# Patient Record
Sex: Female | Born: 1955 | Race: White | Hispanic: No | Marital: Married | Smoking: Never smoker
Health system: Southern US, Community
[De-identification: ages and names within clinical notes are randomized; demographics above are authoritative.]

## PROBLEM LIST (undated history)

## (undated) DIAGNOSIS — M310 Hypersensitivity angiitis: Secondary | ICD-10-CM

## (undated) DIAGNOSIS — G4733 Obstructive sleep apnea (adult) (pediatric): Secondary | ICD-10-CM

## (undated) DIAGNOSIS — I2510 Atherosclerotic heart disease of native coronary artery without angina pectoris: Secondary | ICD-10-CM

## (undated) DIAGNOSIS — M719 Bursopathy, unspecified: Secondary | ICD-10-CM

## (undated) DIAGNOSIS — N393 Stress incontinence (female) (male): Secondary | ICD-10-CM

## (undated) DIAGNOSIS — E039 Hypothyroidism, unspecified: Secondary | ICD-10-CM

## (undated) DIAGNOSIS — G5603 Carpal tunnel syndrome, bilateral upper limbs: Secondary | ICD-10-CM

## (undated) DIAGNOSIS — N390 Urinary tract infection, site not specified: Secondary | ICD-10-CM

## (undated) DIAGNOSIS — M1990 Unspecified osteoarthritis, unspecified site: Secondary | ICD-10-CM

## (undated) DIAGNOSIS — C801 Malignant (primary) neoplasm, unspecified: Secondary | ICD-10-CM

## (undated) DIAGNOSIS — E282 Polycystic ovarian syndrome: Secondary | ICD-10-CM

## (undated) DIAGNOSIS — H532 Diplopia: Secondary | ICD-10-CM

## (undated) DIAGNOSIS — N950 Postmenopausal bleeding: Secondary | ICD-10-CM

## (undated) DIAGNOSIS — G4730 Sleep apnea, unspecified: Secondary | ICD-10-CM

## (undated) DIAGNOSIS — N979 Female infertility, unspecified: Secondary | ICD-10-CM

## (undated) DIAGNOSIS — M5020 Other cervical disc displacement, unspecified cervical region: Secondary | ICD-10-CM

## (undated) DIAGNOSIS — I2584 Coronary atherosclerosis due to calcified coronary lesion: Principal | ICD-10-CM

## (undated) DIAGNOSIS — I499 Cardiac arrhythmia, unspecified: Secondary | ICD-10-CM

## (undated) DIAGNOSIS — I10 Essential (primary) hypertension: Secondary | ICD-10-CM

## (undated) DIAGNOSIS — D3A090 Benign carcinoid tumor of the bronchus and lung: Secondary | ICD-10-CM

## (undated) DIAGNOSIS — Z973 Presence of spectacles and contact lenses: Secondary | ICD-10-CM

## (undated) HISTORY — PX: POLYPECTOMY: SHX149

## (undated) HISTORY — DX: Stress incontinence (female) (male): N39.3

## (undated) HISTORY — DX: Hypothyroidism, unspecified: E03.9

## (undated) HISTORY — DX: Diplopia: H53.2

## (undated) HISTORY — PX: MOUTH SURGERY: SHX715

## (undated) HISTORY — DX: Other cervical disc displacement, unspecified cervical region: M50.20

## (undated) HISTORY — DX: Female infertility, unspecified: N97.9

## (undated) HISTORY — DX: Sleep apnea, unspecified: G47.30

## (undated) HISTORY — DX: Polycystic ovarian syndrome: E28.2

## (undated) HISTORY — DX: Urinary tract infection, site not specified: N39.0

## (undated) HISTORY — DX: Hypersensitivity angiitis: M31.0

## (undated) HISTORY — PX: CATARACT EXTRACTION: SUR2

## (undated) HISTORY — DX: Unspecified osteoarthritis, unspecified site: M19.90

## (undated) HISTORY — DX: Benign carcinoid tumor of the bronchus and lung: D3A.090

## (undated) HISTORY — PX: OTHER SURGICAL HISTORY: SHX169

## (undated) HISTORY — DX: Atherosclerotic heart disease of native coronary artery without angina pectoris: I25.10

## (undated) HISTORY — DX: Obstructive sleep apnea (adult) (pediatric): G47.33

## (undated) HISTORY — PX: WISDOM TOOTH EXTRACTION: SHX21

## (undated) HISTORY — PX: COLONOSCOPY: SHX174

## (undated) HISTORY — DX: Bursopathy, unspecified: M71.9

## (undated) HISTORY — DX: Coronary atherosclerosis due to calcified coronary lesion: I25.84

## (undated) HISTORY — DX: Essential (primary) hypertension: I10

---

## 1997-12-22 ENCOUNTER — Encounter: Payer: Self-pay | Admitting: Internal Medicine

## 2000-01-17 ENCOUNTER — Other Ambulatory Visit: Admission: RE | Admit: 2000-01-17 | Discharge: 2000-01-17 | Payer: Self-pay | Admitting: Internal Medicine

## 2001-01-19 ENCOUNTER — Other Ambulatory Visit: Admission: RE | Admit: 2001-01-19 | Discharge: 2001-01-19 | Payer: Self-pay | Admitting: Internal Medicine

## 2002-01-25 ENCOUNTER — Other Ambulatory Visit: Admission: RE | Admit: 2002-01-25 | Discharge: 2002-01-25 | Payer: Self-pay | Admitting: Internal Medicine

## 2003-01-13 ENCOUNTER — Encounter: Payer: Self-pay | Admitting: Internal Medicine

## 2003-02-06 ENCOUNTER — Other Ambulatory Visit: Admission: RE | Admit: 2003-02-06 | Discharge: 2003-02-06 | Payer: Self-pay | Admitting: Internal Medicine

## 2005-01-13 ENCOUNTER — Ambulatory Visit: Payer: Self-pay | Admitting: Internal Medicine

## 2005-01-17 ENCOUNTER — Ambulatory Visit: Payer: Self-pay | Admitting: Internal Medicine

## 2005-02-12 ENCOUNTER — Ambulatory Visit: Payer: Self-pay | Admitting: Internal Medicine

## 2005-02-20 ENCOUNTER — Ambulatory Visit: Payer: Self-pay | Admitting: Internal Medicine

## 2005-02-20 ENCOUNTER — Other Ambulatory Visit: Admission: RE | Admit: 2005-02-20 | Discharge: 2005-02-20 | Payer: Self-pay | Admitting: Internal Medicine

## 2005-07-16 ENCOUNTER — Ambulatory Visit: Payer: Self-pay | Admitting: Internal Medicine

## 2005-07-31 ENCOUNTER — Encounter (INDEPENDENT_AMBULATORY_CARE_PROVIDER_SITE_OTHER): Payer: Self-pay | Admitting: *Deleted

## 2005-07-31 ENCOUNTER — Ambulatory Visit: Payer: Self-pay | Admitting: Internal Medicine

## 2006-01-12 ENCOUNTER — Ambulatory Visit: Payer: Self-pay | Admitting: Internal Medicine

## 2006-02-23 ENCOUNTER — Other Ambulatory Visit: Admission: RE | Admit: 2006-02-23 | Discharge: 2006-02-23 | Payer: Self-pay | Admitting: Obstetrics & Gynecology

## 2006-05-11 ENCOUNTER — Ambulatory Visit: Payer: Self-pay | Admitting: Internal Medicine

## 2006-08-05 ENCOUNTER — Ambulatory Visit: Payer: Self-pay | Admitting: Internal Medicine

## 2006-09-03 ENCOUNTER — Ambulatory Visit: Payer: Self-pay | Admitting: Internal Medicine

## 2006-10-15 ENCOUNTER — Ambulatory Visit: Payer: Self-pay | Admitting: Internal Medicine

## 2006-11-17 ENCOUNTER — Ambulatory Visit: Payer: Self-pay | Admitting: Internal Medicine

## 2007-01-18 ENCOUNTER — Ambulatory Visit: Payer: Self-pay | Admitting: Internal Medicine

## 2007-01-18 LAB — CONVERTED CEMR LAB
ALT: 21 units/L (ref 0–40)
AST: 24 units/L (ref 0–37)
Albumin: 3.4 g/dL — ABNORMAL LOW (ref 3.5–5.2)
Alkaline Phosphatase: 82 units/L (ref 39–117)
Basophils Absolute: 0 10*3/uL (ref 0.0–0.1)
Calcium: 9.1 mg/dL (ref 8.4–10.5)
Chloride: 103 meq/L (ref 96–112)
Cholesterol: 151 mg/dL (ref 0–200)
Creatinine, Ser: 0.7 mg/dL (ref 0.4–1.2)
Eosinophils Absolute: 0.2 10*3/uL (ref 0.0–0.6)
GFR calc non Af Amer: 93 mL/min
HCT: 43.9 % (ref 36.0–46.0)
LDL Cholesterol: 85 mg/dL (ref 0–99)
MCHC: 34.8 g/dL (ref 30.0–36.0)
MCV: 87.5 fL (ref 78.0–100.0)
Platelets: 283 10*3/uL (ref 150–400)
RBC: 5.02 M/uL (ref 3.87–5.11)
RDW: 12.3 % (ref 11.5–14.6)
Total Bilirubin: 0.7 mg/dL (ref 0.3–1.2)
Total CHOL/HDL Ratio: 2.9
Triglycerides: 72 mg/dL (ref 0–149)
WBC: 7.4 10*3/uL (ref 4.5–10.5)

## 2007-03-04 ENCOUNTER — Other Ambulatory Visit: Admission: RE | Admit: 2007-03-04 | Discharge: 2007-03-04 | Payer: Self-pay | Admitting: Obstetrics & Gynecology

## 2007-05-19 ENCOUNTER — Ambulatory Visit: Payer: Self-pay | Admitting: Internal Medicine

## 2007-05-19 LAB — CONVERTED CEMR LAB: TSH: 5.18 microintl units/mL (ref 0.35–5.50)

## 2007-06-25 DIAGNOSIS — K219 Gastro-esophageal reflux disease without esophagitis: Secondary | ICD-10-CM | POA: Insufficient documentation

## 2007-06-25 DIAGNOSIS — I10 Essential (primary) hypertension: Secondary | ICD-10-CM | POA: Insufficient documentation

## 2007-06-25 DIAGNOSIS — M1990 Unspecified osteoarthritis, unspecified site: Secondary | ICD-10-CM | POA: Insufficient documentation

## 2007-08-13 ENCOUNTER — Telehealth: Payer: Self-pay | Admitting: Internal Medicine

## 2007-08-17 ENCOUNTER — Ambulatory Visit: Payer: Self-pay | Admitting: Internal Medicine

## 2007-08-17 LAB — CONVERTED CEMR LAB
Bilirubin Urine: NEGATIVE
Bilirubin, Direct: 0.2 mg/dL (ref 0.0–0.3)
Blood in Urine, dipstick: NEGATIVE
Calcium: 8.8 mg/dL (ref 8.4–10.5)
Cholesterol: 144 mg/dL (ref 0–200)
Eosinophils Absolute: 0.2 10*3/uL (ref 0.0–0.6)
Eosinophils Relative: 2.3 % (ref 0.0–5.0)
GFR calc Af Amer: 113 mL/min
GFR calc non Af Amer: 93 mL/min
Glucose, Bld: 83 mg/dL (ref 70–99)
Glucose, Urine, Semiquant: NEGATIVE
Ketones, urine, test strip: NEGATIVE
Lymphocytes Relative: 31.9 % (ref 12.0–46.0)
MCHC: 34.4 g/dL (ref 30.0–36.0)
MCV: 89.2 fL (ref 78.0–100.0)
Neutro Abs: 4.8 10*3/uL (ref 1.4–7.7)
Neutrophils Relative %: 57.5 % (ref 43.0–77.0)
Platelets: 269 10*3/uL (ref 150–400)
Sodium: 141 meq/L (ref 135–145)
Specific Gravity, Urine: <1.005
TSH: 3.82 microintl units/mL (ref 0.35–5.50)
Triglycerides: 64 mg/dL (ref 0–149)
WBC Urine, dipstick: NEGATIVE
WBC: 8.4 10*3/uL (ref 4.5–10.5)
pH: 5.5

## 2007-08-24 ENCOUNTER — Ambulatory Visit: Payer: Self-pay | Admitting: Internal Medicine

## 2007-08-24 DIAGNOSIS — R946 Abnormal results of thyroid function studies: Secondary | ICD-10-CM | POA: Insufficient documentation

## 2007-08-24 LAB — HM COLONOSCOPY

## 2007-10-01 ENCOUNTER — Ambulatory Visit: Payer: Self-pay | Admitting: Internal Medicine

## 2007-12-28 ENCOUNTER — Telehealth: Payer: Self-pay | Admitting: Internal Medicine

## 2007-12-31 ENCOUNTER — Ambulatory Visit: Payer: Self-pay | Admitting: Internal Medicine

## 2008-01-02 LAB — CONVERTED CEMR LAB
TSH: 4.299 microintl units/mL (ref 0.350–5.50)
Thyroglobulin Ab: 48.9 (ref 0.0–60.0)

## 2008-01-07 ENCOUNTER — Ambulatory Visit: Payer: Self-pay | Admitting: Internal Medicine

## 2008-01-07 DIAGNOSIS — R002 Palpitations: Secondary | ICD-10-CM | POA: Insufficient documentation

## 2008-01-07 DIAGNOSIS — E669 Obesity, unspecified: Secondary | ICD-10-CM

## 2008-02-24 ENCOUNTER — Telehealth: Payer: Self-pay | Admitting: Internal Medicine

## 2008-03-06 ENCOUNTER — Ambulatory Visit: Payer: Self-pay | Admitting: Internal Medicine

## 2008-03-08 ENCOUNTER — Other Ambulatory Visit: Admission: RE | Admit: 2008-03-08 | Discharge: 2008-03-08 | Payer: Self-pay | Admitting: Obstetrics & Gynecology

## 2008-03-13 ENCOUNTER — Ambulatory Visit: Payer: Self-pay | Admitting: Internal Medicine

## 2008-03-13 DIAGNOSIS — R143 Flatulence: Secondary | ICD-10-CM

## 2008-03-13 DIAGNOSIS — R142 Eructation: Secondary | ICD-10-CM

## 2008-03-13 DIAGNOSIS — R141 Gas pain: Secondary | ICD-10-CM | POA: Insufficient documentation

## 2008-03-13 LAB — CONVERTED CEMR LAB
BUN: 15 mg/dL (ref 6–23)
Calcium: 9.1 mg/dL (ref 8.4–10.5)
GFR calc Af Amer: 113 mL/min
Glucose, Bld: 109 mg/dL — ABNORMAL HIGH (ref 70–99)

## 2008-04-04 ENCOUNTER — Encounter: Payer: Self-pay | Admitting: Internal Medicine

## 2008-04-25 ENCOUNTER — Encounter: Payer: Self-pay | Admitting: Internal Medicine

## 2008-06-06 ENCOUNTER — Encounter: Payer: Self-pay | Admitting: Internal Medicine

## 2008-06-07 ENCOUNTER — Ambulatory Visit: Payer: Self-pay | Admitting: Internal Medicine

## 2008-06-20 ENCOUNTER — Encounter: Payer: Self-pay | Admitting: Internal Medicine

## 2008-06-23 ENCOUNTER — Ambulatory Visit: Payer: Self-pay | Admitting: Internal Medicine

## 2008-07-31 ENCOUNTER — Encounter: Payer: Self-pay | Admitting: Internal Medicine

## 2008-10-09 ENCOUNTER — Ambulatory Visit: Payer: Self-pay | Admitting: Internal Medicine

## 2008-10-09 LAB — CONVERTED CEMR LAB
AST: 21 units/L (ref 0–37)
Alkaline Phosphatase: 57 units/L (ref 39–117)
BUN: 15 mg/dL (ref 6–23)
Basophils Absolute: 0 10*3/uL (ref 0.0–0.1)
Bilirubin, Direct: 0.1 mg/dL (ref 0.0–0.3)
Blood in Urine, dipstick: NEGATIVE
Chloride: 103 meq/L (ref 96–112)
Eosinophils Absolute: 0.1 10*3/uL (ref 0.0–0.7)
Eosinophils Relative: 2 % (ref 0.0–5.0)
GFR calc non Af Amer: 93 mL/min
HDL: 53.2 mg/dL (ref >39.0)
MCV: 91.2 fL (ref 78.0–100.0)
Neutrophils Relative %: 64.4 % (ref 43.0–77.0)
Nitrite: NEGATIVE
Platelets: 225 10*3/uL (ref 150–400)
Potassium: 4.3 meq/L (ref 3.5–5.1)
Protein, U semiquant: NEGATIVE
RDW: 12.4 % (ref 11.5–14.6)
Sodium: 140 meq/L (ref 135–145)
Total Bilirubin: 0.9 mg/dL (ref 0.3–1.2)
Total CHOL/HDL Ratio: 2.6
VLDL: 11 mg/dL (ref 0–40)
WBC Urine, dipstick: NEGATIVE
WBC: 7.1 10*3/uL (ref 4.5–10.5)
pH: 7.5

## 2008-10-16 ENCOUNTER — Ambulatory Visit: Payer: Self-pay | Admitting: Internal Medicine

## 2008-10-16 DIAGNOSIS — E039 Hypothyroidism, unspecified: Secondary | ICD-10-CM | POA: Insufficient documentation

## 2008-10-30 ENCOUNTER — Ambulatory Visit: Payer: Self-pay | Admitting: Internal Medicine

## 2008-10-30 DIAGNOSIS — K1370 Unspecified lesions of oral mucosa: Secondary | ICD-10-CM | POA: Insufficient documentation

## 2008-10-30 DIAGNOSIS — K121 Other forms of stomatitis: Secondary | ICD-10-CM | POA: Insufficient documentation

## 2008-10-30 DIAGNOSIS — K1230 Oral mucositis (ulcerative), unspecified: Secondary | ICD-10-CM

## 2009-01-09 ENCOUNTER — Ambulatory Visit: Payer: Self-pay | Admitting: Internal Medicine

## 2009-01-10 ENCOUNTER — Encounter: Payer: Self-pay | Admitting: Internal Medicine

## 2009-01-22 ENCOUNTER — Ambulatory Visit: Payer: Self-pay | Admitting: Internal Medicine

## 2009-03-01 LAB — HM MAMMOGRAPHY

## 2009-03-14 ENCOUNTER — Other Ambulatory Visit: Admission: RE | Admit: 2009-03-14 | Discharge: 2009-03-14 | Payer: Self-pay | Admitting: Obstetrics & Gynecology

## 2009-04-23 ENCOUNTER — Ambulatory Visit: Payer: Self-pay | Admitting: Internal Medicine

## 2009-05-01 LAB — CONVERTED CEMR LAB: TSH: 5.68 microintl units/mL — ABNORMAL HIGH (ref 0.35–5.50)

## 2009-05-04 ENCOUNTER — Encounter: Payer: Self-pay | Admitting: Internal Medicine

## 2009-06-26 ENCOUNTER — Ambulatory Visit: Payer: Self-pay | Admitting: Internal Medicine

## 2009-06-26 LAB — CONVERTED CEMR LAB: TSH: 3.68 microintl units/mL (ref 0.35–5.50)

## 2009-07-06 ENCOUNTER — Ambulatory Visit: Payer: Self-pay | Admitting: Internal Medicine

## 2009-07-06 DIAGNOSIS — M542 Cervicalgia: Secondary | ICD-10-CM | POA: Insufficient documentation

## 2009-10-08 ENCOUNTER — Ambulatory Visit: Payer: Self-pay | Admitting: Internal Medicine

## 2009-10-12 LAB — CONVERTED CEMR LAB: TSH: 1.54 microintl units/mL (ref 0.35–5.50)

## 2010-01-01 ENCOUNTER — Telehealth: Payer: Self-pay | Admitting: Internal Medicine

## 2010-01-07 ENCOUNTER — Ambulatory Visit: Payer: Self-pay | Admitting: Internal Medicine

## 2010-01-07 LAB — CONVERTED CEMR LAB
ALT: 22 units/L (ref 0–35)
AST: 25 units/L (ref 0–37)
Albumin: 3.6 g/dL (ref 3.5–5.2)
BUN: 16 mg/dL (ref 6–23)
Bilirubin Urine: NEGATIVE
Chloride: 104 meq/L (ref 96–112)
Cholesterol: 151 mg/dL (ref 0–200)
Eosinophils Relative: 2.9 % (ref 0.0–5.0)
FSH: 34.5 milliintl units/mL
Glucose, Bld: 93 mg/dL (ref 70–99)
Glucose, Urine, Semiquant: NEGATIVE
HCT: 46 % (ref 36.0–46.0)
Hemoglobin: 15.6 g/dL — ABNORMAL HIGH (ref 12.0–15.0)
Lymphs Abs: 1.8 10*3/uL (ref 0.7–4.0)
MCV: 91.8 fL (ref 78.0–100.0)
Monocytes Absolute: 0.8 10*3/uL (ref 0.1–1.0)
Monocytes Relative: 10.6 % (ref 3.0–12.0)
Neutro Abs: 4.3 10*3/uL (ref 1.4–7.7)
Platelets: 229 10*3/uL (ref 150.0–400.0)
Potassium: 4.1 meq/L (ref 3.5–5.1)
RDW: 11.8 % (ref 11.5–14.6)
Sodium: 140 meq/L (ref 135–145)
TSH: 3.7 microintl units/mL (ref 0.35–5.50)
Total Bilirubin: 0.7 mg/dL (ref 0.3–1.2)
Total Protein: 7.6 g/dL (ref 6.0–8.3)
WBC Urine, dipstick: NEGATIVE
WBC: 7.1 10*3/uL (ref 4.5–10.5)
pH: 7.5

## 2010-01-21 ENCOUNTER — Ambulatory Visit: Payer: Self-pay | Admitting: Internal Medicine

## 2010-01-21 DIAGNOSIS — J069 Acute upper respiratory infection, unspecified: Secondary | ICD-10-CM | POA: Insufficient documentation

## 2010-01-21 DIAGNOSIS — M2550 Pain in unspecified joint: Secondary | ICD-10-CM | POA: Insufficient documentation

## 2010-05-08 ENCOUNTER — Ambulatory Visit: Payer: Self-pay | Admitting: Internal Medicine

## 2010-05-10 ENCOUNTER — Encounter: Payer: Self-pay | Admitting: *Deleted

## 2010-05-23 ENCOUNTER — Telehealth: Payer: Self-pay | Admitting: *Deleted

## 2010-06-28 ENCOUNTER — Encounter: Payer: Self-pay | Admitting: Internal Medicine

## 2010-07-20 HISTORY — PX: SHOULDER OPEN ROTATOR CUFF REPAIR: SHX2407

## 2010-12-01 DIAGNOSIS — D3A090 Benign carcinoid tumor of the bronchus and lung: Secondary | ICD-10-CM

## 2010-12-01 HISTORY — PX: OTHER SURGICAL HISTORY: SHX169

## 2010-12-01 HISTORY — DX: Benign carcinoid tumor of the bronchus and lung: D3A.090

## 2010-12-31 NOTE — Letter (Signed)
Summary: Request for Surgical Clearance/[facility]  Request for Surgical Clearance/ Orthopaedics   Imported By: Maryln Gottron 07/10/2010 09:30:22  _____________________________________________________________________  External Attachment:    Type:   Image     Comment:   External Document

## 2010-12-31 NOTE — Assessment & Plan Note (Signed)
Summary: cpx/no pap/njr pt rsc/njr   Vital Signs:  Patient profile:   55 year old female Menstrual status:  irregular LMP:     01/29/2009 Height:      59.75 inches Weight:      220 pounds BMI:     43.48 Pulse rate:   78 / minute BP sitting:   130 / 80  (left arm) Cuff size:   large  Vitals Entered By: Romualdo Bolk, CMA (AAMA) (January 21, 2010 10:01 AM) CC: CPX no pap- Pt has a gyn who does paps. LMP (date): 01/29/2009 Menarche (age onset years): 10   Menses interval (days): varies Menstrual flow (days): 4 Enter LMP: 01/29/2009 Last PAP Result normal   History of Present Illness: Nichole Smith comesin today for  preventive visit . Since last visit  here  there have been no major changes in health status   however has gained some weight recently.   URI :    Head cold  for 2 weeks and  now    getting better.  no fever of pain with this.  BP:   120/80 range controlled. Has general aches and pains that move around . H xof DJD   on MRI  seen ortho in the past.  Start   skipping exercise. because of  discomfort.    THyroid : no change in meds  No menses for 11 months   sees gyne no hot flushes  sleeping better as husband is now on machine for snoring.   Preventive Care Screening  Mammogram:    Date:  03/01/2009    Results:  normal   Pap Smear:    Date:  03/01/2009    Results:  normal   Colonoscopy:    Date:  07/31/2005    Results:  normal   Prior Values:    Last Tetanus Booster:  Tdap (10/16/2008)   Preventive Screening-Counseling & Management  Alcohol-Tobacco     Alcohol drinks/day: 0     Smoking Status: never  Caffeine-Diet-Exercise     Caffeine use/day: 2     Does Patient Exercise: yes     Type of exercise: aerobics, weight, walking, h20 aeerobics     Exercise (avg: min/session): 30-60     Times/week: 7  Hep-HIV-STD-Contraception     Dental Visit-last 6 months yes  Safety-Violence-Falls     Seat Belt Use: yes     Smoke Detectors:  yes  EKG  Procedure date:  10/26/2008  Findings:      Sinus bradycardia with rate of:  59  Current Medications (verified): 1)  Cyclobenzaprine Hcl 10 Mg  Tabs (Cyclobenzaprine Hcl) .[name].. 1 By Mouth Three Times A Day As Needed Muscle Spasm 2)  Maxzide-25 37.5-25 Mg  Tabs (Triamterene-Hctz) .[name].. 1 By Mouth Once Daily 3)  Norvasc 2.5 Mg  Tabs (Amlodipine Besylate) .[name].. 1 By Mouth Once Daily 4)  Metoprolol Succinate 100 Mg  Xr24h-Tab (Metoprolol Succinate) .[name].. 1 By Mouth Once Daily 5)  Synthroid 137 Mcg Tabs (Levothyroxine Sodium) .[name].. 1 By Mouth Once Daily  Allergies (verified): 1)  ! Neomycin  Past History:  Past medical, surgical, family and social histories (including risk factors) reviewed, and no changes noted (except as noted below).  Past Medical History: Reviewed history from 01/22/2009 and no changes required. GERD Hypertension Osteoarthritis C6 C7 HNP childbirth x3 twin pregnancy   POs antithyroid antibodies         LAST Mammogram: 4/09 Pap: 4/09 Td: 1999- doing today Colonscopy: 07/31/05 EKG:  doing today [facility]  Elizebeth Brooking  Past Surgical History: Reviewed history from 06/25/2007 and no changes required. Caesarean section  Past History:  Care Management: OB/Gyn: Hyac[facility] Gastroenterology: [facility]  Family History: Reviewed history from 10/16/2008 and no changes required. non contributory Family History of CAD Female 1st degree relative 43 father  Family History Hypertension MOM brothersx2 Family History of Stroke F 1st degree relative  mom 48 Fa died 63 ? MI Mom died 35 ? CVA tob, HT  Brother  recently  dx    multiple system atrophy.   GF  Parkinsons.     Social History: Reviewed history from 01/22/2009 and no changes required. 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    Hhof 4    Seat Belt Use:  yes Dental Care w/in 6 mos.:  yes  Review of Systems  The patient denies anorexia, fever, weight loss, weight  gain, vision loss, decreased hearing, hoarseness, chest pain, syncope, dyspnea on exertion, peripheral edema, prolonged cough, headaches, hemoptysis, abdominal pain, melena, hematochezia, severe indigestion/heartburn, hematuria, muscle weakness, transient blindness, difficulty walking, depression, unusual weight change, abnormal bleeding, enlarged lymph nodes, angioedema, and breast masses.   Physical Exam General Appearance: well developed, well nourished, no acute distress Eyes: conjunctiva and lids normal, PERRLA, EOMI, WNL Ears, Nose, Mouth, Throat: TM clear, nares clear, oral exam WNL minimal congestion Neck: supple, no lymphadenopathy, no thyromegaly, no JVD Respiratory: clear to auscultation and percussion, respiratory effort normal Cardiovascular: regular rate and rhythm, S1-S2, no murmur, rub or gallop, no bruits, peripheral pulses normal and symmetric, no cyanosis, clubbing, edema or varicosities Chest: no scars, masses, tenderness; no asymmetry, skin changes, nipple discharge   Gastrointestinal: soft, non-tender; no hepatosplenomegaly, masses; active bowel sounds all quadrants,  Genitourinary: per gyne Lymphatic: no cervical, axillary or inguinal adenopathy Musculoskeletal: gait normal, muscle tone and strength WNL, no joint swelling, effusions, discoloration, crepitus  Skin: clear, good turgor, color WNL, no rashes, lesions, or ulcerations Neurologic: normal mental status, normal reflexes, normal strength, sensation, and motion Psychiatric: alert; oriented to person, place and time Other Exam:  labs normal     Impression & Recommendations:  Problem # 1:  HEALTH MAINTENANCE EXAM, ADULT (ICD-V70.0) counseled    about lifestyle intervention and weight loss again  Problem # 2:  HYPOTHYROIDISM (ICD-244.9)  branded med   tsh ok today but recheck in 4-6 months to ensure not increasing The following medications were removed from the medication list:    Synthroid 125 Mcg Tabs  (Levothyroxine sodium) .[name]... 1 by mouth once daily Her updated medication list for this problem includes:    Synthroid 137 Mcg Tabs (Levothyroxine sodium) .[name]... 1 by mouth once daily  Labs Reviewed: TSH: 3.70 (01/07/2010)    Chol: 151 (01/07/2010)   HDL: 55.70 (01/07/2010)   LDL: 81 (01/07/2010)   TG: 73.0 (01/07/2010)  Orders: Prescription Created Electronically [phone])  Problem # 3:  PAIN IN JOINT, MULTIPLE SITES (ICD-719.49) prob djd  by hx and prev evals    .[name]   Discussed risk benefit  or nsaids but is keeping her form being active then can try once a day.   losng weight will help her  joint snd she is aware of this.   Problem # 4:  HYPERTENSION (ICD-401.9) Assessment: Unchanged  Her updated medication list for this problem includes:    Maxzide-25 37.5-25 Mg Tabs (Triamterene-hctz) .[name]... 1 by mouth once daily    Norvasc 2.5 Mg Tabs (Amlodipine besylate) .Marland KitchenMarland KitchenMarland KitchenMarland Kitchen 1  by mouth once daily    Metoprolol Succinate 100 Mg Xr24h-tab (Metoprolol succinate) .[name]... 1 by mouth once daily  BP today: 130/80 Prior BP: 120/78 (07/06/2009)  Prior 10 Yr Risk Heart Disease: 5 % (01/22/2009)  Labs Reviewed: K+: 4.1 (01/07/2010) Creat: : 0.8 (01/07/2010)   Chol: 151 (01/07/2010)   HDL: 55.70 (01/07/2010)   LDL: 81 (01/07/2010)   TG: 73.0 (01/07/2010)  Problem # 5:  URI (ICD-465.9) resolving uncomplicated   call as needed.   Complete Medication List: 1)  Cyclobenzaprine Hcl 10 Mg Tabs (Cyclobenzaprine hcl) .[name].. 1 by mouth three times a day as needed muscle spasm 2)  Maxzide-25 37.5-25 Mg Tabs (Triamterene-hctz) .[name].. 1 by mouth once daily 3)  Norvasc 2.5 Mg Tabs (Amlodipine besylate) .[name].. 1 by mouth once daily 4)  Metoprolol Succinate 100 Mg Xr24h-tab (Metoprolol succinate) .[name].. 1 by mouth once daily 5)  Synthroid 137 Mcg Tabs (Levothyroxine sodium) .[name].. 1 by mouth once daily  Patient Instructions: 1)  take  aleve once a day for about 2 weeks and then as needed .  2)  recheck TSH in 4-6 months  if  ok then  can  recheck  cpx in a year form now. 3)  Continue weight loss .  Prescriptions: SYNTHROID 137 MCG TABS (LEVOTHYROXINE SODIUM) 1 by mouth once daily Brand medically necessary #90 x 3   Entered and Authorized by:   Madelin Headings MD   Signed by:   Madelin Headings MD on 01/21/2010   Method used:   Electronically to        [facility] [phone]* (retail)       [address]       [place], [place]  [address]       Ph: [phone] or [phone]       Fax: [phone]   RxID:   [id number] METOPROLOL SUCCINATE 100 MG  XR24H-TAB (METOPROLOL SUCCINATE) 1 by mouth once daily  #90 Tablet x 3   Entered and Authorized by:   Madelin Headings MD   Signed by:   Madelin Headings MD on 01/21/2010   Method used:   Electronically to        [facility] [phone]* (retail)       [address]       [place], [place]  [address]       Ph: [phone] or [phone]       Fax: [phone]   RxID:   [id number] NORVASC 2.5 MG  TABS (AMLODIPINE BESYLATE) 1 by mouth once daily  #90 Tablet x 3   Entered and Authorized by:   Madelin Headings MD   Signed by:   Madelin Headings MD on 01/21/2010   Method used:   Electronically to        [id number] (retail)       [address]       [place], [place]  [address]       Ph: [phone] or [phone]       Fax: [phone]   RxID:   [id number] MAXZIDE-25 37.5-25 MG  TABS (TRIAMTERENE-HCTZ) 1 by mouth once daily  #90 x 3   Entered and Authorized by:   Madelin Headings MD   Signed by:   Madelin Headings MD on 01/21/2010   Method used:   [facility]        [id number]       [address]       [place], [place]  [address]       Ph: [phone] or [phone]       Fax: [phone]   RxID:   [id number]

## 2010-12-31 NOTE — Letter (Signed)
Summary: Generic Letter  [place] [facility]  [address] [place], [place] [address]   Phone: [phone]  Fax: [phone]    05/10/2010  EMSLEY CUSTER [address] [place], [place]  [address]  Dear Ms. Mayford Knife,  Your thyroid test is normal. We will see you for your physical in Feb. 2012 or soonier if needed. If you have any questions, please give [name] a call at [phone].         Sincerely,   Tor Netters, CMA (AAMA)

## 2010-12-31 NOTE — Progress Notes (Signed)
Summary: Surgery clearance form, OV needed?  Phone Note Call from Patient Call back at Work Phone [phone]   Caller: Patient Call For: Madelin Headings MD Summary of Call: VM from pt, she has a form for Dr Fabian Sharp to fill out, clearance for Dr Jillyn Hidden to do surgery on her torn rotator cuff. Can she drop if off or does Dr Fabian Sharp need to see me?  Initial call taken by: Sid Falcon LPN,  May 23, 2010 10:35 AM  Follow-up for Phone Call        ok to just drop it off  form as long as she is not having any new problems Follow-up by: Madelin Headings MD,  May 23, 2010 12:47 PM  Additional Follow-up for Phone Call Additional follow up Details #1::        Pt aware and will drop off the form. Additional Follow-up by: Romualdo Bolk, CMA Duncan Dull),  May 24, 2010 12:45 PM

## 2010-12-31 NOTE — Progress Notes (Signed)
Summary: [facility] requested with Mon labs  Phone Note Call from Patient Call back at [facility] Phone [phone] Call back at Work Phone [phone]   Call For: Nichole Smith/shannon Summary of Call: With Mon labs want the [facility] menopause determiner, done.  No period 10 months.  GYN appt April.  I'm a hard stick, so having it done once is better than twice. Initial call taken by: Rudy Jew, RN,  January 01, 2010 4:18 PM  Follow-up for Phone Call        Per Dr. Fabian Sharp- she doesn't normally do them because in a typical menopause situation it is not usually required. But if she thinks her gyn needs it we can do it. Follow-up by: Romualdo Bolk, CMA Duncan Dull),  January 01, 2010 5:27 PM  Additional Follow-up for Phone Call Additional follow up Details #1::        Left message to inform that if Gyn needs it will add to [facility] labs. Additional Follow-up by: Rudy Jew, RN,  January 02, 2010 3:31 PM

## 2011-01-06 ENCOUNTER — Encounter: Payer: Self-pay | Admitting: Internal Medicine

## 2011-01-13 ENCOUNTER — Other Ambulatory Visit: Payer: BC Managed Care – PPO | Admitting: Internal Medicine

## 2011-01-13 DIAGNOSIS — Z0000 Encounter for general adult medical examination without abnormal findings: Secondary | ICD-10-CM

## 2011-01-13 LAB — CBC WITH DIFFERENTIAL/PLATELET
Basophils Absolute: 0 10*3/uL (ref 0.0–0.1)
Basophils Relative: 0.4 % (ref 0.0–3.0)
Eosinophils Relative: 1.8 % (ref 0.0–5.0)
Hemoglobin: 15.7 g/dL — ABNORMAL HIGH (ref 12.0–15.0)
Lymphocytes Relative: 36.3 % (ref 12.0–46.0)
Monocytes Relative: 7.1 % (ref 3.0–12.0)
Neutro Abs: 4.2 10*3/uL (ref 1.4–7.7)
RBC: 5.03 Mil/uL (ref 3.87–5.11)

## 2011-01-13 LAB — POCT URINALYSIS DIPSTICK
Blood, UA: NEGATIVE
Ketones, UA: NEGATIVE
Protein, UA: NEGATIVE
Spec Grav, UA: 1.015
Urobilinogen, UA: 0.2
pH, UA: 7.5

## 2011-01-13 LAB — LIPID PANEL
Cholesterol: 145 mg/dL (ref 0–200)
HDL: 45.5 mg/dL (ref >39.00)
LDL Cholesterol: 89 mg/dL (ref 0–99)
Triglycerides: 55 mg/dL (ref 0.0–149.0)
VLDL: 11 mg/dL (ref 0.0–40.0)

## 2011-01-13 LAB — BASIC METABOLIC PANEL
Calcium: 8.7 mg/dL (ref 8.4–10.5)
GFR: 105.69 mL/min (ref >60.00)
Sodium: 140 mEq/L (ref 135–145)

## 2011-01-13 LAB — HEPATIC FUNCTION PANEL
AST: 21 U/L (ref 0–37)
Albumin: 3.5 g/dL (ref 3.5–5.2)
Total Bilirubin: 0.7 mg/dL (ref 0.3–1.2)

## 2011-01-13 LAB — TSH: TSH: 1.93 u[IU]/mL (ref 0.35–5.50)

## 2011-01-22 ENCOUNTER — Encounter: Payer: Self-pay | Admitting: Internal Medicine

## 2011-01-22 ENCOUNTER — Ambulatory Visit (INDEPENDENT_AMBULATORY_CARE_PROVIDER_SITE_OTHER): Payer: BC Managed Care – PPO | Admitting: Internal Medicine

## 2011-01-22 VITALS — BP 120/84 | HR 72 | Temp 98.0°F | Resp 12 | Ht 60.5 in | Wt 221.0 lb

## 2011-01-22 DIAGNOSIS — K219 Gastro-esophageal reflux disease without esophagitis: Secondary | ICD-10-CM

## 2011-01-22 DIAGNOSIS — E039 Hypothyroidism, unspecified: Secondary | ICD-10-CM

## 2011-01-22 DIAGNOSIS — M5020 Other cervical disc displacement, unspecified cervical region: Secondary | ICD-10-CM

## 2011-01-22 DIAGNOSIS — Z0000 Encounter for general adult medical examination without abnormal findings: Secondary | ICD-10-CM

## 2011-01-22 DIAGNOSIS — M542 Cervicalgia: Secondary | ICD-10-CM

## 2011-01-22 DIAGNOSIS — I10 Essential (primary) hypertension: Secondary | ICD-10-CM

## 2011-01-22 DIAGNOSIS — E669 Obesity, unspecified: Secondary | ICD-10-CM

## 2011-01-22 DIAGNOSIS — M1990 Unspecified osteoarthritis, unspecified site: Secondary | ICD-10-CM

## 2011-01-22 HISTORY — DX: Other cervical disc displacement, unspecified cervical region: M50.20

## 2011-01-22 MED ORDER — METOPROLOL SUCCINATE ER 100 MG PO TB24: 100.0000 mg | ORAL_TABLET | Freq: Every day | ORAL | Status: DC

## 2011-01-22 MED ORDER — TRIAMTERENE-HCTZ 37.5-25 MG PO TABS: 1.0000 | ORAL_TABLET | Freq: Every day | ORAL | Status: DC

## 2011-01-22 MED ORDER — LEVOTHYROXINE SODIUM 137 MCG PO TABS: 137.0000 ug | ORAL_TABLET | Freq: Every day | ORAL | Status: DC

## 2011-01-22 MED ORDER — AMLODIPINE BESYLATE 2.5 MG PO TABS: 2.5000 mg | ORAL_TABLET | Freq: Every day | ORAL | Status: DC

## 2011-01-22 NOTE — Patient Instructions (Signed)
Continue life stle changes  return office visit in 6 months  Call in meantime if needed

## 2011-01-22 NOTE — Assessment & Plan Note (Signed)
Having more difficulty since had shoulder surgery but did lose 7 # since Jan.     Gyne sugg check out lap band but hesitant Counseled. Today that she may be a good candidate as she is motivated and has been implementing interventions for a while

## 2011-01-22 NOTE — Progress Notes (Signed)
  Subjective:    Patient ID: Nichole Smith, female    DOB: May 12, 1956, 55 y.o.   MRN: [id number]  HPI PAt comesin today for preventive check and med disease follow up . No major change in health status since last visit .  Except had right rotator cuff repair in Aug 2011 . Healing  Left shoulder is still an issues. HT: controlled THyroid : taking brand meds Obesity: had gained  And now losing but taking a lot to do this  And asks advice about lap band sugg by her gyne.   Review of Systems 12 system review neg for cp sob  Pos:for joint pains no weakness some tingling in hands when positioned upright. No weakness  No falls bleedingg change in bowel habits hearing or vision.      Objective:   Physical Exam Physical Exam: Vital signs reviewed LOV:FIEP is a well-developed well-nourished alert cooperative  white female who appears her stated age in no acute distress.  HEENT: normocephalic  traumatic , Eyes: PERRL EOM's full, conjunctiva clear, Nares: paten,t no deformity discharge or tenderness., Ears: no deformity EAC's clear TMs with normal landmarks. Mouth: clear OP, no lesions, edema.  Moist mucous membranes. Dentition in adequate repair. NECK: supple without masses, thyromegaly or bruits. CHEST/PULM:  Clear to auscultation and percussion breath sounds equal no wheeze , rales or rhonchi. No chest wall deformities or tenderness. Breast: no nodule of discharge  axilla clear  CV: PMI is nondisplaced, S1 S2 no gallops, murmurs, rubs. Peripheral pulses are full without delay.No JVD .  ABDOMEN: Bowel sounds normal nontender  No guard or rebound, no hepato splenomegal no CVA tenderness.  No hernia. Extremtities:  No clubbing cyanosis or edema, no acute joint swelling or redness no focal atrophy some oa change  NEURO:  Oriented x3, cranial nerves 3-12 appear to be intact, no obvious focal weakness,gait within normal limits no abnormal reflexes or asymmetrical SKIN: No acute rashes normal turgor,  color, no bruising or petechiae. PSYCH: Oriented, good eye contact, no obvious depression anxiety, cognition and judgment appear normal.  labs reviewed with patient        Assessment & Plan:  Preventive visit  Thyroid HT Obesity  Counseled.  About options in her situation. Continue lifestyle intervention healthy eating and exercise .

## 2011-01-22 NOTE — Assessment & Plan Note (Signed)
Controlled on current regimen.   

## 2011-01-22 NOTE — Assessment & Plan Note (Signed)
From presumed  Neck cervical disease

## 2011-01-22 NOTE — Assessment & Plan Note (Signed)
Currently on no meds  

## 2011-01-22 NOTE — Assessment & Plan Note (Addendum)
Continue same meds

## 2011-01-22 NOTE — Assessment & Plan Note (Signed)
No change 

## 2011-02-11 ENCOUNTER — Other Ambulatory Visit: Payer: Self-pay | Admitting: Internal Medicine

## 2011-05-07 ENCOUNTER — Telehealth: Payer: Self-pay | Admitting: Internal Medicine

## 2011-05-07 NOTE — Telephone Encounter (Signed)
Nothing from Dr. Dellia Nims office yet. Pt to call them and get the records from them. Appt made because pt wants md's opinion of her dx. Pt to get a copy of the records and bring them with her incase we don't get them.

## 2011-05-07 NOTE — Telephone Encounter (Signed)
Pt called and wanted to see if Dr Fabian Sharp had rcvd any lab result from dermatologist, Dr Erven Colla office. This was a biopsy that was done on pts leg approx 3 wks ago. Pls call.

## 2011-05-26 ENCOUNTER — Encounter: Payer: Self-pay | Admitting: Internal Medicine

## 2011-05-26 ENCOUNTER — Ambulatory Visit (INDEPENDENT_AMBULATORY_CARE_PROVIDER_SITE_OTHER): Payer: BC Managed Care – PPO | Admitting: Internal Medicine

## 2011-05-26 VITALS — BP 130/80 | HR 72 | Wt 221.0 lb

## 2011-05-26 DIAGNOSIS — H6090 Unspecified otitis externa, unspecified ear: Secondary | ICD-10-CM

## 2011-05-26 DIAGNOSIS — H60399 Other infective otitis externa, unspecified ear: Secondary | ICD-10-CM

## 2011-05-26 DIAGNOSIS — R918 Other nonspecific abnormal finding of lung field: Secondary | ICD-10-CM

## 2011-05-26 DIAGNOSIS — R9389 Abnormal findings on diagnostic imaging of other specified body structures: Secondary | ICD-10-CM

## 2011-05-26 DIAGNOSIS — M310 Hypersensitivity angiitis: Secondary | ICD-10-CM

## 2011-05-26 DIAGNOSIS — H6120 Impacted cerumen, unspecified ear: Secondary | ICD-10-CM

## 2011-05-26 HISTORY — DX: Hypersensitivity angiitis: M31.0

## 2011-05-26 LAB — POCT URINALYSIS DIPSTICK
Blood, UA: NEGATIVE
Glucose, UA: NEGATIVE
Spec Grav, UA: 1.01
Urobilinogen, UA: 0.2
pH, UA: 5.5

## 2011-05-26 MED ORDER — DESONIDE 0.05 % EX LOTN: TOPICAL_LOTION | CUTANEOUS | Status: DC

## 2011-05-26 NOTE — Progress Notes (Signed)
  Subjective:    Patient ID: Nichole Smith, female    DOB: [birth year], 55 y.o.   MRN: [id number]  HPI Patient comes in today at the request of her dermatologist Dr. Elmon Else. She had a rash of some sort on her left lower extremity that was biopsied and showed leukocytoclastic angiitis.  She is requesting screening tests for vascular disease to include above labs.  Has had this  Rash in the past with  Prolonged exercise in the heat  And found this after travel to [place].    Originally got in heat of [place]  Years ago .  Usually medial rash.  Golfers vascultitis.?    She is also having problems with her right ear where it feels clogged and had been itchy. She had tried scratching and now does not hear well in her right ear. No otherwise fever congestion otherwise. HT thyroid no change on meds  Review of Systems Negative for chest pain shortness of breath hemoptysis wheezing asthma. New major changes in vision or hearing chest pain or new shortness of breath. No acute joint swelling although has had arthritis pain baack pain hx  no change no redness over her joints. No hematuria.  Has hypertension controlled hypothyroidism on medication. No raynauds phenom  Past history family history social history reviewed in the electronic medical record.     Objective:   Physical Exam WdWN in nad  HEENT: Normocephalic ;atraumatic , Eyes;  PERRL, EOMs  Full, lids and conjunctiva clear,,Ears: no deformities, canals nl left  Right with yelllow flaking wax and debri  Tm seems nl no redness or swelling. After eructation of the ear  , TM landmarks normal, Nose: no deformity or discharge  Mouth : OP clear without lesion or edema . Neck no masse s Chest:  Clear to A&P without wheezes rales or rhonchi CV:  S1-S2 no gallops or murmurs peripheral perfusion is normal Skin le  smal peppery red areas no purpura or petechia  Nl perfusion  Abdomen:  Sof,t normal bowel sounds without hepatosplenomegaly, no  guarding rebound or masses no CVA tenderness Neuro grossly intact .      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   No ob vascultitis or  Underlying cause at this time but plan screening tests as recommended .   Ear external otitis seems eczematous.   Discussion and can try steroid lotion as needed call of this is not helping.  Hypertension Thyroid disease Obesity Neck and back pain presumed degenerative. chronic and not aggressive.    Addendum :labs normal   Chest x ray shows prominent right hilum and chest ct recommended  Will proceed.

## 2011-05-26 NOTE — Patient Instructions (Signed)
Will notify you  of labs when available. And also  Dr Emily Filbert. Try steroid lotion a few drops in ear 2 x  Per day for no more than 7-10 days at a time for ear eczema.

## 2011-05-27 ENCOUNTER — Ambulatory Visit (INDEPENDENT_AMBULATORY_CARE_PROVIDER_SITE_OTHER)
Admission: RE | Admit: 2011-05-27 | Discharge: 2011-05-27 | Disposition: A | Discharge status: Home / Self Care | Payer: BC Managed Care – PPO | Source: Ambulatory Visit | Attending: Internal Medicine | Admitting: Internal Medicine

## 2011-05-27 DIAGNOSIS — M310 Hypersensitivity angiitis: Secondary | ICD-10-CM

## 2011-05-27 LAB — COMPREHENSIVE METABOLIC PANEL
AST: 24 U/L (ref 0–37)
Alkaline Phosphatase: 72 U/L (ref 39–117)
BUN: 22 mg/dL (ref 6–23)
Glucose, Bld: 91 mg/dL (ref 70–99)
Sodium: 139 mEq/L (ref 135–145)
Total Bilirubin: 0.5 mg/dL (ref 0.3–1.2)
Total Protein: 6.9 g/dL (ref 6.0–8.3)

## 2011-05-27 LAB — CBC WITH DIFFERENTIAL/PLATELET
Basophils Absolute: 0.1 10*3/uL (ref 0.0–0.1)
Basophils Relative: 0.5 % (ref 0.0–3.0)
Eosinophils Absolute: 0.3 10*3/uL (ref 0.0–0.7)
MCHC: 33.9 g/dL (ref 30.0–36.0)
MCV: 91.8 fl (ref 78.0–100.0)
Monocytes Absolute: 0.7 10*3/uL (ref 0.1–1.0)
Neutrophils Relative %: 55.9 % (ref 43.0–77.0)
RBC: 4.98 Mil/uL (ref 3.87–5.11)
RDW: 13.2 % (ref 11.5–14.6)

## 2011-05-27 LAB — HEPATITIS C ANTIBODY: HCV Ab: NEGATIVE

## 2011-05-27 LAB — ANA: Anti Nuclear Antibody(ANA): NEGATIVE

## 2011-05-27 LAB — C-REACTIVE PROTEIN: CRP: 0.5 mg/dL (ref <0.6)

## 2011-05-27 LAB — CYCLIC CITRUL PEPTIDE ANTIBODY, IGG: Cyclic Citrullin Peptide Ab: <2 U/mL (ref 0.0–5.0)

## 2011-05-28 ENCOUNTER — Telehealth: Payer: Self-pay | Admitting: Internal Medicine

## 2011-05-28 ENCOUNTER — Ambulatory Visit (INDEPENDENT_AMBULATORY_CARE_PROVIDER_SITE_OTHER)
Admission: RE | Admit: 2011-05-28 | Discharge: 2011-05-28 | Disposition: A | Discharge status: Home / Self Care | Payer: BC Managed Care – PPO | Source: Ambulatory Visit | Attending: Internal Medicine | Admitting: Internal Medicine

## 2011-05-28 DIAGNOSIS — R918 Other nonspecific abnormal finding of lung field: Secondary | ICD-10-CM

## 2011-05-28 DIAGNOSIS — R911 Solitary pulmonary nodule: Secondary | ICD-10-CM

## 2011-05-28 DIAGNOSIS — R9389 Abnormal findings on diagnostic imaging of other specified body structures: Secondary | ICD-10-CM

## 2011-05-28 MED ORDER — IOHEXOL 300 MG/ML  SOLN
80.0000 mL | Freq: Once | INTRAMUSCULAR | Status: AC | PRN
  Administered 2011-05-28: 80 mL via INTRAVENOUS

## 2011-05-28 NOTE — Telephone Encounter (Signed)
Discussed results of the CT scan with the patient. He has no symptoms We will plan on chest surgery consult as soon as possible. Unsure best method to get a biopsy.  She is a nonsmoker but did grow up in smoking household.  Please send a referral for cardiothoracic surgery to be seen as soon as possible for a suspicious pulmonary nodule. The chest CT

## 2011-05-29 ENCOUNTER — Encounter: Payer: Self-pay | Admitting: Internal Medicine

## 2011-05-29 NOTE — Progress Notes (Signed)
Taken care of

## 2011-05-29 NOTE — Telephone Encounter (Signed)
Order sent to [facility] 

## 2011-06-03 ENCOUNTER — Telehealth: Payer: Self-pay | Admitting: *Deleted

## 2011-06-03 ENCOUNTER — Other Ambulatory Visit: Payer: Self-pay | Admitting: Surgery

## 2011-06-03 ENCOUNTER — Encounter (INDEPENDENT_AMBULATORY_CARE_PROVIDER_SITE_OTHER): Payer: BC Managed Care – PPO | Admitting: Surgery

## 2011-06-03 DIAGNOSIS — D381 Neoplasm of uncertain behavior of trachea, bronchus and lung: Secondary | ICD-10-CM

## 2011-06-03 DIAGNOSIS — R918 Other nonspecific abnormal finding of lung field: Secondary | ICD-10-CM

## 2011-06-03 NOTE — Telephone Encounter (Signed)
Mom on cell phone

## 2011-06-03 NOTE — Telephone Encounter (Signed)
Pt saw the cardiac/thoracic surgeon today, and she really wants to speak to Dr. Fabian Sharp.

## 2011-06-03 NOTE — Telephone Encounter (Signed)
Left message to call back on home and cell numbers.

## 2011-06-04 NOTE — Consult Note (Addendum)
NEW PATIENT CONSULTATION  Nichole Smith, Nichole Smith DOB:  03-08-1956                                        June 03, 2011 CHART #:  [id number]  REASON FOR CONSULTATION:  Right middle lobe lung mass.  CLINICAL HISTORY:  I was asked by Dr. Fabian Sharp to evaluate Nichole Smith for treatment of her newly diagnosed right middle lobe lung mass.  She is a 55 year old nonsmoker who has a history of recurring rash on her lower extremities.  She said she was seen by her dermatologist and had a biopsy which showed leukocytoclastic angiitis.  She was sent back to Dr. Fabian Sharp for screening test for vascular disease and had a chest x-ray performed that showed this right middle lobe abnormality.  She subsequently had a CT scan of the chest on May 28, 2011, which showed a 2.2-2.5 cm rounded nodular density in the right middle lobe centrally with some postobstructive volume loss in the lateral segment of the right middle lobe.  There is a calcified granuloma in the right lower lobe and some calcified subcarinal lymph nodes.  There were no pathologically enlarged mediastinal, hilar, or axillary lymph nodes. There are no pericardial pleural effusions.  REVIEW OF SYSTEMS:  GENERAL:  She denies any fever or chills.  She has had no change in her appetite and no weight loss.  She denies fatigue. EYES:  Negative. ENT:  Negative. ENDOCRINE:  She does have a history of hypothyroidism and is on replacement.  She denies diabetes. CARDIOVASCULAR:  She denies any chest pain or pressure.  She denies exertional dyspnea.  She has had no PND or orthopnea.  She denies peripheral edema.  RESPIRATORY:  She denies any cough or sputum production.  She has had no hemoptysis.  She denies any wheezing. GI:  She has had no nausea or vomiting.  She denies melena and bright red blood per rectum. GU:  She denies dysuria and hematuria. MUSCULOSKELETAL:  She does have some joint pains and muscle pains. VASCULAR:  She  denies any claudication or phlebitis. NEUROLOGICAL:  She denies any focal weakness or numbness.  She denies dizziness and syncope.  She has never had TIA or stroke. SKIN:  She does have this rash that has been recurrent on her lower extremities which she feels is related to heat.  The issues have resolved spontaneously. PSYCHIATRIC:  Negative. HEMATOLOGICAL:  Negative.  ALLERGIES:  None.  MEDICATIONS: 1. Amlodipine 2.5 mg daily. 2. Metoprolol-XL 100 mg daily. 3. Synthroid 137 mcg daily. 4. Maxzide 25 mg daily.  PAST MEDICAL HISTORY:  Significant for hypertension.  She has a history of hypothyroidism.  She has a history of lower extremity rash as noted above.  SOCIAL HISTORY:  She is married, works as an Environmental health practitioner. She has 3 children.  She has 1 daughter who is getting married in August or September.  She has never smoked.  Denies alcohol use.  FAMILY HISTORY:  Positive for cardiac disease in her mother and brothers.  There is no history of lung cancer or any lung tumors.  PHYSICAL EXAMINATION:  Vital Signs:  Her blood pressure is 140/81, pulse 88 and regular, respiratory rate is 18 and unlabored.  Oxygen saturation on room air is 93%.  General:  She is a well-developed, obese white female, in no distress.  HEENT:  Normocephalic and atraumatic.  Pupils  are equal and reactive to light.  Extraocular muscles are intact. Oropharynx is clear.  Neck:  Normal carotid pulses bilaterally.  There are no bruits.  There is adenopathy or thyromegaly.  Cardiac:  Regular rate and rhythm with normal S1 and S2.  There is no murmur, rub or gallop.  Lungs:  Clear.  Abdomen:  Active bowel sounds.  Abdomen is soft, obese and nontender.  There are no palpable masses or organomegaly.  Extremities:  No peripheral edema.  Pedal pulses are palpable bilaterally.  Skin:  Warm and dry with small areas of rash in both lower extremities.  IMPRESSION:  The patient has a 2.2 x 2.5-cm right  middle lobe lung mass that is associated with some mild postobstructive volume loss due to bronchial obstruction.  This could be a primary bronchogenic carcinoma, but is fairly rounded and could be a carcinoid tumor.  We will obtain a PET scan and then plan on proceeding with surgical resection with right middle lobectomy assuming that her PET scan does not show any other areas of hypermetabolic uptake. She is a fairly healthy nonsmoker and should do well with that procedure.  I discussed the scans and plan with her husband and they are in full agreement.  Nichole Smith, M.D. Electronically Signed  BB/MEDQ  D:  06/03/2011  T:  06/04/2011  Job:  [id number]  cc:   Neta Mends. Fabian Sharp, MD

## 2011-06-05 ENCOUNTER — Telehealth: Payer: Self-pay | Admitting: *Deleted

## 2011-06-05 NOTE — Telephone Encounter (Signed)
Pt wants to discuss about Dr. Laneta Simmers- Pt wants to know if she needs to get second opinion on his recommendation and what you think of him. He has told her that she needs to have a lumpectomy of her lung removed. She is going for a PET Scan tomorrow. She trust your opinion about this and wants to know what you recommend.

## 2011-06-06 ENCOUNTER — Encounter (HOSPITAL_COMMUNITY)
Admission: RE | Admit: 2011-06-06 | Discharge: 2011-06-06 | Disposition: A | Discharge status: Home / Self Care | Payer: BC Managed Care – PPO | Source: Ambulatory Visit | Attending: Surgery | Admitting: Surgery

## 2011-06-06 ENCOUNTER — Encounter (HOSPITAL_COMMUNITY): Payer: Self-pay

## 2011-06-06 DIAGNOSIS — R222 Localized swelling, mass and lump, trunk: Secondary | ICD-10-CM | POA: Insufficient documentation

## 2011-06-06 DIAGNOSIS — R918 Other nonspecific abnormal finding of lung field: Secondary | ICD-10-CM

## 2011-06-06 MED ORDER — FLUDEOXYGLUCOSE F - 18 (FDG) INJECTION
17.5000 | Freq: Once | INTRAVENOUS | Status: AC | PRN
  Administered 2011-06-06: 17.5 via INTRAVENOUS

## 2011-06-06 NOTE — Telephone Encounter (Signed)
Discussed situation with patient she has a positive PET scan localized to the area involved. She is scheduled  for surgery for right middle lobectomy next Friday, July 13.    Recommend she call with any questions or if we can help facilitate care.

## 2011-06-10 ENCOUNTER — Ambulatory Visit (INDEPENDENT_AMBULATORY_CARE_PROVIDER_SITE_OTHER): Payer: BC Managed Care – PPO | Admitting: Surgery

## 2011-06-10 DIAGNOSIS — D381 Neoplasm of uncertain behavior of trachea, bronchus and lung: Secondary | ICD-10-CM

## 2011-06-11 ENCOUNTER — Ambulatory Visit (HOSPITAL_COMMUNITY)
Admission: RE | Admit: 2011-06-11 | Discharge: 2011-06-11 | Disposition: A | Discharge status: Home / Self Care | Payer: BC Managed Care – PPO | Source: Ambulatory Visit | Attending: Surgery | Admitting: Surgery

## 2011-06-11 ENCOUNTER — Encounter (HOSPITAL_COMMUNITY)
Admission: RE | Admit: 2011-06-11 | Discharge: 2011-06-11 | Disposition: A | Discharge status: Home / Self Care | Payer: BC Managed Care – PPO | Source: Ambulatory Visit | Attending: Surgery | Admitting: Surgery

## 2011-06-11 ENCOUNTER — Other Ambulatory Visit: Payer: Self-pay | Admitting: Surgery

## 2011-06-11 DIAGNOSIS — Z01811 Encounter for preprocedural respiratory examination: Secondary | ICD-10-CM

## 2011-06-11 DIAGNOSIS — J984 Other disorders of lung: Secondary | ICD-10-CM | POA: Insufficient documentation

## 2011-06-11 DIAGNOSIS — C801 Malignant (primary) neoplasm, unspecified: Secondary | ICD-10-CM | POA: Insufficient documentation

## 2011-06-11 DIAGNOSIS — Z01818 Encounter for other preprocedural examination: Secondary | ICD-10-CM | POA: Insufficient documentation

## 2011-06-11 DIAGNOSIS — Z01812 Encounter for preprocedural laboratory examination: Secondary | ICD-10-CM | POA: Insufficient documentation

## 2011-06-11 LAB — BLOOD GAS, ARTERIAL
Bicarbonate: 27.8 mEq/L — ABNORMAL HIGH (ref 20.0–24.0)
Drawn by: 206361
FIO2: 0.21 %
O2 Saturation: 97.2 %
Patient temperature: 98.6

## 2011-06-11 LAB — COMPREHENSIVE METABOLIC PANEL
ALT: 20 U/L (ref 0–35)
Alkaline Phosphatase: 81 U/L (ref 39–117)
CO2: 27 mEq/L (ref 19–32)
GFR calc Af Amer: >60 mL/min (ref >60)
GFR calc non Af Amer: >60 mL/min (ref >60)
Glucose, Bld: 125 mg/dL — ABNORMAL HIGH (ref 70–99)
Potassium: 4.1 mEq/L (ref 3.5–5.1)
Sodium: 138 mEq/L (ref 135–145)

## 2011-06-11 LAB — CBC
Hemoglobin: 16.4 g/dL — ABNORMAL HIGH (ref 12.0–15.0)
MCH: 31.3 pg (ref 26.0–34.0)
MCHC: 36.1 g/dL — ABNORMAL HIGH (ref 30.0–36.0)
RDW: 12.9 % (ref 11.5–15.5)

## 2011-06-11 LAB — URINALYSIS, ROUTINE W REFLEX MICROSCOPIC
Bilirubin Urine: NEGATIVE
Glucose, UA: NEGATIVE mg/dL
Hgb urine dipstick: NEGATIVE
Protein, ur: NEGATIVE mg/dL

## 2011-06-11 LAB — PROTIME-INR: Prothrombin Time: 13 seconds (ref 11.6–15.2)

## 2011-06-11 NOTE — Assessment & Plan Note (Addendum)
OFFICE VISIT  TAIYLOR, VIRDEN DOB:  1956/02/25                                        June 10, 2011 CHART #:  [id number]  The patient returned to my office today to discuss the results of her PET scan as well as to further discuss surgery.  Her PET scan showed hypermetabolic activity within the right middle lobe nodule with a maximum SUV of 5.0.  There were no other areas of abnormal hypermetabolism in the neck, chest, abdomen and her pelvis.  We discussed the plan surgery for right middle lobectomy.  I discussed the possibility of doing this thoracoscopically depending on the anatomy, but that may require a small thoracotomy.  I discussed the benefits and risks of surgery including, but not limited to bleeding, blood transfusion, infection, prolonged air leak, need for further lung resection, and she understands all this and agrees to proceed.  She is scheduled for surgery on Friday, June 13, 2011.  Evelene Croon, M.D. Electronically Signed  BB/MEDQ  D:  06/10/2011  T:  06/11/2011  Job:  [id number]  cc:   Neta Mends. Fabian Sharp, MD

## 2011-06-13 ENCOUNTER — Inpatient Hospital Stay (HOSPITAL_COMMUNITY)
Admission: RE | Admit: 2011-06-13 | Discharge: 2011-06-17 | DRG: 082 | Disposition: A | Discharge status: Home / Self Care | Payer: BC Managed Care – PPO | Source: Ambulatory Visit | Attending: Surgery | Admitting: Surgery

## 2011-06-13 ENCOUNTER — Other Ambulatory Visit: Payer: Self-pay | Admitting: Surgery

## 2011-06-13 ENCOUNTER — Inpatient Hospital Stay (HOSPITAL_COMMUNITY): Payer: BC Managed Care – PPO

## 2011-06-13 DIAGNOSIS — I10 Essential (primary) hypertension: Secondary | ICD-10-CM | POA: Diagnosis present

## 2011-06-13 DIAGNOSIS — D381 Neoplasm of uncertain behavior of trachea, bronchus and lung: Secondary | ICD-10-CM

## 2011-06-13 DIAGNOSIS — Z01812 Encounter for preprocedural laboratory examination: Secondary | ICD-10-CM

## 2011-06-13 DIAGNOSIS — M310 Hypersensitivity angiitis: Secondary | ICD-10-CM | POA: Diagnosis present

## 2011-06-13 DIAGNOSIS — D3A090 Benign carcinoid tumor of the bronchus and lung: Principal | ICD-10-CM | POA: Diagnosis present

## 2011-06-14 ENCOUNTER — Inpatient Hospital Stay (HOSPITAL_COMMUNITY): Payer: BC Managed Care – PPO

## 2011-06-14 LAB — BASIC METABOLIC PANEL
BUN: 14 mg/dL (ref 6–23)
CO2: 29 mEq/L (ref 19–32)
Chloride: 100 mEq/L (ref 96–112)
Creatinine, Ser: 0.57 mg/dL (ref 0.50–1.10)

## 2011-06-14 LAB — CBC
HCT: 38.3 % (ref 36.0–46.0)
MCV: 88 fL (ref 78.0–100.0)
Platelets: 230 10*3/uL (ref 150–400)
RBC: 4.35 MIL/uL (ref 3.87–5.11)
WBC: 22.1 10*3/uL — ABNORMAL HIGH (ref 4.0–10.5)

## 2011-06-14 LAB — POCT I-STAT 3, ART BLOOD GAS (G3+)
Bicarbonate: 29.3 mEq/L — ABNORMAL HIGH (ref 20.0–24.0)
TCO2: 31 mmol/L (ref 0–100)
pCO2 arterial: 54.9 mmHg — ABNORMAL HIGH (ref 35.0–45.0)
pH, Arterial: 7.335 — ABNORMAL LOW (ref 7.350–7.400)
pO2, Arterial: 67 mmHg — ABNORMAL LOW (ref 80.0–100.0)

## 2011-06-15 ENCOUNTER — Inpatient Hospital Stay (HOSPITAL_COMMUNITY): Payer: BC Managed Care – PPO

## 2011-06-15 LAB — CBC
HCT: 40.4 % (ref 36.0–46.0)
MCV: 89.4 fL (ref 78.0–100.0)
RDW: 13.2 % (ref 11.5–15.5)
WBC: 19 10*3/uL — ABNORMAL HIGH (ref 4.0–10.5)

## 2011-06-15 LAB — COMPREHENSIVE METABOLIC PANEL
Albumin: 2.4 g/dL — ABNORMAL LOW (ref 3.5–5.2)
BUN: 7 mg/dL (ref 6–23)
Chloride: 98 mEq/L (ref 96–112)
Creatinine, Ser: 0.47 mg/dL — ABNORMAL LOW (ref 0.50–1.10)
GFR calc Af Amer: >60 mL/min (ref >60)
GFR calc non Af Amer: >60 mL/min (ref >60)
Glucose, Bld: 130 mg/dL — ABNORMAL HIGH (ref 70–99)
Total Bilirubin: 0.4 mg/dL (ref 0.3–1.2)

## 2011-06-16 ENCOUNTER — Inpatient Hospital Stay (HOSPITAL_COMMUNITY): Payer: BC Managed Care – PPO

## 2011-06-16 LAB — BASIC METABOLIC PANEL
GFR calc Af Amer: >60 mL/min (ref >60)
GFR calc non Af Amer: >60 mL/min (ref >60)
Potassium: 3.8 mEq/L (ref 3.5–5.1)
Sodium: 138 mEq/L (ref 135–145)

## 2011-06-16 LAB — CBC
Hemoglobin: 13.2 g/dL (ref 12.0–15.0)
Platelets: 212 10*3/uL (ref 150–400)
RBC: 4.28 MIL/uL (ref 3.87–5.11)
WBC: 17.4 10*3/uL — ABNORMAL HIGH (ref 4.0–10.5)

## 2011-06-17 ENCOUNTER — Inpatient Hospital Stay (HOSPITAL_COMMUNITY): Payer: BC Managed Care – PPO

## 2011-06-25 ENCOUNTER — Encounter (INDEPENDENT_AMBULATORY_CARE_PROVIDER_SITE_OTHER): Payer: Self-pay

## 2011-06-25 DIAGNOSIS — C3490 Malignant neoplasm of unspecified part of unspecified bronchus or lung: Secondary | ICD-10-CM

## 2011-06-30 ENCOUNTER — Other Ambulatory Visit: Payer: Self-pay | Admitting: Surgery

## 2011-06-30 DIAGNOSIS — C342 Malignant neoplasm of middle lobe, bronchus or lung: Secondary | ICD-10-CM

## 2011-07-01 ENCOUNTER — Ambulatory Visit
Admission: RE | Admit: 2011-07-01 | Discharge: 2011-07-01 | Disposition: A | Discharge status: Home / Self Care | Payer: BC Managed Care – PPO | Source: Ambulatory Visit | Attending: Surgery | Admitting: Surgery

## 2011-07-01 ENCOUNTER — Encounter (INDEPENDENT_AMBULATORY_CARE_PROVIDER_SITE_OTHER): Payer: Self-pay | Admitting: Surgery

## 2011-07-01 DIAGNOSIS — C342 Malignant neoplasm of middle lobe, bronchus or lung: Secondary | ICD-10-CM

## 2011-07-01 DIAGNOSIS — C3490 Malignant neoplasm of unspecified part of unspecified bronchus or lung: Secondary | ICD-10-CM

## 2011-07-01 NOTE — Assessment & Plan Note (Addendum)
OFFICE VISIT  JOLI, KOOB DOB:  05-30-1956                                        July 01, 2011 CHART #:  [id number]  The patient returned to my office today for followup status post right mini thoracotomy and right middle lobectomy for a right middle lobe tumor.  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  This was staged as a T1b lesion.  She had an uncomplicated postoperative course and since discharge has been continuing to improve.  She is walking daily without chest pain or shortness of breath.  She has no complaints.  PHYSICAL EXAMINATION:  VITAL SIGNS:  Blood pressure 133/85, pulse 76 and regular, respiratory rate 16 and unlabored.  Oxygen saturation on room air 96%.  GENERAL:  She looks well.  LUNGS:  Clear.  CHEST:  The right thoracotomy incision is healing well.  The chest tube site has slight separation and mild pinkish discoloration around it.  The suture was removed 1 week ago.  The patient said that it seems to be getting better.  Follow up chest x-ray today shows clear lung fields and no pleural effusions.  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  She does not require any further therapy for this.  I will plan to see her back in 3 months and at which time we will repeat her chest x-ray.  I will continue to follow her over the next few years with periodic x-rays.  The incidence of recurrence is extremely low.  Evelene Croon, M.D. Electronically Signed  BB/MEDQ  D:  07/01/2011  T:  07/01/2011  Job:  [id number]  cc:   Neta Mends. Fabian Sharp, MD

## 2011-07-21 NOTE — Op Note (Signed)
  NAMEMarland Kitchen  Smith, Nichole Smith              ACCOUNT NO.:  [id number]  MEDICAL RECORD NO.:  [id number]  LOCATION:  2301                         FACILITY:  [facility]  PHYSICIAN:  Evelene Croon, M.D.     DATE OF BIRTH:  1955/12/28  DATE OF PROCEDURE:  06/13/2011 DATE OF DISCHARGE:                              OPERATIVE REPORT   PREOPERATIVE DIAGNOSIS:  Right middle lobe lung tumor.  POSTOPERATIVE DIAGNOSIS:  Right middle lobe lung tumor.  PROCEDURE:  Flexible fiberoptic bronchoscopy.  SURGEON:  Evelene Croon, MD.  ANESTHESIA:  General endotracheal.  CLINICAL HISTORY:  [name] has a right middle lobe lung mass, but is hypermetabolic on PET scan and has the appearance of possible carcinoid tumor.  She is going to undergo resection today and requires flexible fiberoptic bronchoscopy to evaluate her airway prior to surgery.  OPERATIVE PROCEDURE:  The patient was taken to the operating room, placed on table in supine position.  After induction of general endotracheal anesthesia using a single-lumen tube, flexible fiberoptic bronchoscopy was performed.  The distal trachea appeared normal.  The carina was sharp.  The left bronchial tree had normal segmental anatomy. There are no endobronchial lesions and no sign of extrinsic compression. The bronchoscope was then brought back to the carina and passed down the right mainstem bronchus.  The right bronchial tree also had normal segmental anatomy.  There was no visible abnormality within the two segmental bronchi going to the right middle lobe.  There was no sign of extrinsic compression and no endobronchial lesions seen.  The bronchoscope was then withdrawn from the patient and plans were made for the remainder of the procedure, which would be dictated separately.     Evelene Croon, M.D.     BB/MEDQ  D:  06/13/2011  T:  06/14/2011  Job:  [id number]  Electronically Signed by Evelene Croon M.D. on 07/21/2011 03:55:53 PM

## 2011-07-21 NOTE — Discharge Summary (Signed)
NAMEMarland Kitchen  Nichole Smith, Nichole Smith              ACCOUNT NO.:  [id number]  MEDICAL RECORD NO.:  [id number]  LOCATION:  3304                         FACILITY:  [facility]  PHYSICIAN:  Evelene Croon, M.D.     DATE OF BIRTH:  February 06, 1956  DATE OF ADMISSION:  06/13/2011 DATE OF DISCHARGE:  06/17/2011                              DISCHARGE SUMMARY   ADMITTING DIAGNOSES: 1. Right middle lobe lung mass. 2. History of hypertension. 3. History of hypothyroidism. 4. 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  DISCHARGE DIAGNOSES: 1. Carcinoid of the right middle lobe. 2. History of hypertension. 3. 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  PROCEDURE:  Flexible fiberoptic bronchoscopy, right VATS, right minithoracotomy, right middle lobectomy by Dr. Laneta Simmers on June 13, 2011.  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  Surgical resection margins appeared negative for tumor.  HISTORY OF PRESENTING ILLNESS:  This is a 55 year old Caucasian female who was initially seen and evaluated by Dr. Laneta Simmers in the office on June 03, 2011, regarding a right middle lobe lung mass.  According to medical records, the patient, who is a nonsmoker, had a history of recurring rash on her lower extremities.  She was seen by her dermatologist and had a biopsy which showed leukocytoclastic angiitis.  She was referred to her primary physician, Dr. Fabian Sharp, for a screening test or vascular disease.  During this workup, she had a chest x-ray that showed a right middle lobe abnormality.  A CT scan of the chest was then done on May 28, 2011, which showed a 2.2-2.5-cm rounded nodular density in the right middle lobe centrally with some postobstructive volume loss in the lateral segment of the right middle lobe.  There was also a calcified granuloma in the right lower lobe and some calcified subcarinal lymph nodes. There was no  pathologically enlarged mediastina,l hilar, or axillary lymph nodes, and no pericardial or pleural effusion.  A PET scan was then obtained on June 06, 2011, which showed a hypermetabolic right middle lobe nodule (SUV max of 5), no evidence of metastatic disease, and uptake in the thyroid.  The patient was then seen in the office again by Dr. Laneta Simmers on June 10, 2011 in order to discuss the indications for right lung surgery.  Potential risks, complications and benefits were discussed with the patient and she agreed to proceed with surgery.  She was admitted to [facility] on June 13, 2011 in order to undergo a right middle lobectomy.  BRIEF HOSPITAL COURSE STAY:  The patient remained afebrile and hemodynamically stable.  Her A-line was removed on postop day #1.  She was not found to have an air leak and her suction was decreased to 10 cm.  Her Foley was removed on June 15, 2011.  Again, her chest tube had no air leak.  Her chest x-ray remained stable.  Her chest tube was placed to water seal on June 16, 2011.  Her chest tube was then removed. as well as her PCA .  The patient was transferred from the Intensive Care Unit to 3300 for further convalescence on June 16, 2011.  Currently on postop day #4, she has been tolerating a diet and she is ambulating fairly well.  Her vital signs are as follows; T-max 99.1 over the last 8 hours, heart rate 70s, BP 139/83, O2 sat 96% on room air.  Chest x-ray done today showed tiny residual amount of pleural air without pneumothorax and improved aeration bilaterally.  PHYSICAL EXAMINATION:  CARDIOVASCULAR:  Regular rate and rhythm. PULMONARY:  Clear. ABDOMEN:  Soft and nontender.  Bowel sounds present. EXTREMITIES:  No lower extremity edema.  Her right posterior chest wounds are clean and dry.  The patient has been seen and evaluated by Dr. Edwyna Shell and is felt surgically stable for discharge today.  Latest laboratory studies are as follows:  CBC done on  June 16, 2011; white blood cell count down to 17,400, H and H 13.2 and 38.2, platelet count of 212,000.  BMET done on this date; potassium 3.8, sodium 138, BUN and creatinine 9 and 0.4 respectively.  Last chest x-ray done today, results as stated above.  Discharge instructions include the following:  DIET:  Low-sodium heart-healthy  ACTIVITY:  The patient may walk up steps.  She may shower.  She is not to lift more than 10 pounds for 2 weeks.  She is not to drive until after 2 weeks.  She is to continue with her breathing exercise daily. She is to walk daily and increase her frequency and duration as tolerates.  FOLLOWUP APPOINTMENTS: 1. She has an appointment to see the nurse of Dr. Sharee Pimple office on     June 25, 2011 at 9:30 a.m. for chest tube suture removal. 2. The patient has an appointment to see Dr. Laneta Simmers on July 01, 2011     at 11:45 a.m.  45 minutes prior to this office appointment, a chest     x-ray will be obtained.  Discharge medications at the time of dictation include the following: 1. Amlodipine 2.5 mg p.o. daily. 2. Fish oil one capsule p.o. daily. 3. Multivitamin p.o. daily. 4. Synthroid 137 mcg p.o. daily. 5. Toprol-XL 100 mg p.o. daily. 6. Triamterene/hydrochlorothiazide 37.5/25 one tablet p.o. daily per     the patient's request. 7. Oxycodone/APAP 5/325 one tablet p.o. q.6 h. p.r.n. severe pain. 8. Ultram 50 mg one to two tablets p.o. q. 4-6 h. p.o. pain.     Doree Fudge, PA   ______________________________ Evelene Croon, M.D.    DZ/MEDQ  D:  06/17/2011  T:  06/17/2011  Job:  [id number]  cc:   Neta Mends. Fabian Sharp, MD  Electronically Signed by Doree Fudge PA on 06/18/2011 03:48:55 PM Electronically Signed by Evelene Croon M.D. on 07/21/2011 03:55:56 PM

## 2011-07-23 ENCOUNTER — Ambulatory Visit: Payer: BC Managed Care – PPO | Admitting: Internal Medicine

## 2011-07-23 ENCOUNTER — Encounter: Payer: Self-pay | Admitting: Internal Medicine

## 2011-07-23 ENCOUNTER — Ambulatory Visit (INDEPENDENT_AMBULATORY_CARE_PROVIDER_SITE_OTHER): Payer: BC Managed Care – PPO | Admitting: Internal Medicine

## 2011-07-23 VITALS — BP 120/80 | HR 66 | Wt 216.0 lb

## 2011-07-23 DIAGNOSIS — M310 Hypersensitivity angiitis: Secondary | ICD-10-CM

## 2011-07-23 DIAGNOSIS — D3A090 Benign carcinoid tumor of the bronchus and lung: Secondary | ICD-10-CM | POA: Insufficient documentation

## 2011-07-23 DIAGNOSIS — T148XXA Other injury of unspecified body region, initial encounter: Secondary | ICD-10-CM

## 2011-07-23 DIAGNOSIS — E669 Obesity, unspecified: Secondary | ICD-10-CM

## 2011-07-23 DIAGNOSIS — E8809 Other disorders of plasma-protein metabolism, not elsewhere classified: Secondary | ICD-10-CM | POA: Insufficient documentation

## 2011-07-23 DIAGNOSIS — I10 Essential (primary) hypertension: Secondary | ICD-10-CM

## 2011-07-23 DIAGNOSIS — E039 Hypothyroidism, unspecified: Secondary | ICD-10-CM

## 2011-07-23 LAB — CBC WITH DIFFERENTIAL/PLATELET
Basophils Absolute: 0 10*3/uL (ref 0.0–0.1)
Eosinophils Relative: 2.7 % (ref 0.0–5.0)
HCT: 43.4 % (ref 36.0–46.0)
Hemoglobin: 14.8 g/dL (ref 12.0–15.0)
Lymphocytes Relative: 33.9 % (ref 12.0–46.0)
Lymphs Abs: 3.3 10*3/uL (ref 0.7–4.0)
Monocytes Relative: 7.3 % (ref 3.0–12.0)
Neutro Abs: 5.5 10*3/uL (ref 1.4–7.7)
RBC: 4.83 Mil/uL (ref 3.87–5.11)
RDW: 12.8 % (ref 11.5–14.6)
WBC: 9.9 10*3/uL (ref 4.5–10.5)

## 2011-07-23 LAB — HEPATIC FUNCTION PANEL
Albumin: 3.5 g/dL (ref 3.5–5.2)
Total Protein: 7.1 g/dL (ref 6.0–8.3)

## 2011-07-23 NOTE — Progress Notes (Signed)
  Subjective:    Patient ID: Nichole Smith, female    DOB: 23-Dec-1955, 55 y.o.   MRN: [id number]  HPI Patient comes in today for follow up of  multiple medical problems.   Since her last visit  She is had a resection of along the tumor bright middle lobe that turned out to be a carcinoid that was small stage I. She is recovered nicely but still have some numbness and healing scar on the right chest. She's to follow-up with the surgeon every 3 to 4 months. The rash on her legs that initiated the workup is somewhat better. The dermatologist had recommended yearly urinalysis and follow to look for signs of other vasculitis.  However she really feels quite well with note new arthritis fever chest pain shortness of breath. Hypertension: controlled Thyroid disease; on replacement   Review of Systems Negative for chest pain shortness of breath vision changes falling swelling. Rest as per HPI  She has bruises on her arms but no bleeding  Past history family history social history reviewed in the electronic medical record. Her daughter is getting married in September.    Objective:   Physical Exam wdwn in nad  Skin well healing scars   Right chest  Wound  looks clean and healing Skin Few bruising left arm  One on right breast . No petechia faded rash le .  No acute joint changes  Chest:  Clear to A&P without wheezes rales or rhonchi CV:  S1-S2 no gallops or murmurs peripheral perfusion is normal Neuro non focal Neck no adenopathy. Abdomen:  Sof,t normal bowel sounds without hepatosplenomegaly, no guarding rebound or masses no CVA tenderness   Medical record review as well as hospital labs. She had an albumin in the twos and hemoglobin in the 10 range  when she was post op.      Assessment & Plan:  S/p lung resection  Stage I carcinoid   Good prognosis by hx  Although i dont have the path report. HT:  Cotrolled or the most part  Bruise seems routine but recheck  cbc today Low alb   Post op   Will repeat today Elevated hg   In the past  And anemia post op weill reheck  Am unaware  Related to above  Dx . Will follow  Leukocytoclastic Angiits Lower extremitis.  Overall no evidence of vasulitis on exam or screening labs  Will follow

## 2011-07-23 NOTE — Patient Instructions (Addendum)
Will notify you  of labs when available.  Check up when due with full blood tests and urine at that time or as needed.

## 2011-07-24 ENCOUNTER — Encounter: Payer: Self-pay | Admitting: *Deleted

## 2011-07-27 ENCOUNTER — Encounter: Payer: Self-pay | Admitting: Internal Medicine

## 2011-10-07 ENCOUNTER — Encounter: Payer: BC Managed Care – PPO | Admitting: Surgery

## 2011-10-15 ENCOUNTER — Other Ambulatory Visit: Payer: Self-pay | Admitting: Surgery

## 2011-10-15 DIAGNOSIS — D381 Neoplasm of uncertain behavior of trachea, bronchus and lung: Secondary | ICD-10-CM

## 2011-10-17 ENCOUNTER — Ambulatory Visit (INDEPENDENT_AMBULATORY_CARE_PROVIDER_SITE_OTHER): Payer: BC Managed Care – PPO | Admitting: Internal Medicine

## 2011-10-17 DIAGNOSIS — Z23 Encounter for immunization: Secondary | ICD-10-CM

## 2011-10-20 ENCOUNTER — Other Ambulatory Visit: Payer: Self-pay

## 2011-10-21 ENCOUNTER — Ambulatory Visit (INDEPENDENT_AMBULATORY_CARE_PROVIDER_SITE_OTHER): Payer: BC Managed Care – PPO | Admitting: Surgery

## 2011-10-21 ENCOUNTER — Ambulatory Visit
Admission: RE | Admit: 2011-10-21 | Discharge: 2011-10-21 | Disposition: A | Discharge status: Home / Self Care | Payer: BC Managed Care – PPO | Source: Ambulatory Visit | Attending: Surgery | Admitting: Surgery

## 2011-10-21 ENCOUNTER — Encounter: Payer: Self-pay | Admitting: Surgery

## 2011-10-21 VITALS — BP 129/75 | HR 68 | Resp 20 | Ht 60.0 in | Wt 220.0 lb

## 2011-10-21 DIAGNOSIS — D381 Neoplasm of uncertain behavior of trachea, bronchus and lung: Secondary | ICD-10-CM

## 2011-10-21 DIAGNOSIS — D3A090 Benign carcinoid tumor of the bronchus and lung: Secondary | ICD-10-CM

## 2011-10-21 NOTE — Progress Notes (Signed)
HPI:  Nichole Smith returns today for followup status post right thoracotomy and right middle lobectomy for a low-grade neuroendocrine carcinoid tumor measuring 2.2 cm. The surgical resection margins were negative. She has been feeling well overall. She said she still has an occasional twinge of pain and some numbness along her incision but overall is very happy with how she feels. She denies any cough or sputum production.  Current Outpatient Prescriptions  Medication Sig Dispense Refill  . amLODipine (NORVASC) 2.5 MG tablet Take 1 tablet (2.5 mg total) by mouth daily.  90 tablet  3  . cyclobenzaprine (FLEXERIL) 10 MG tablet Take 10 mg by mouth 3 (three) times daily as needed.        . desonide (DESOWEN) 0.05 % lotion Apply to affected area 2 times daily  60 mL  1  . metoprolol (TOPROL-XL) 100 MG 24 hr tablet Take 1 tablet (100 mg total) by mouth daily.  90 tablet  3  . SYNTHROID 137 MCG tablet TAKE 1 TABLET BY MOUTH EVERY DAY  90 tablet  3  . triamterene-hydrochlorothiazide (MAXZIDE-25) 37.5-25 MG per tablet Take 1 tablet by mouth daily.  90 tablet  3     Physical Exam:  She looks well. There is no cervical or supraclavicular adenopathy. Lungs are clear. The right thoracotomy incision is well-healed. There are no skin lesions.  Diagnostic Tests:  Chest x-ray today shows clear lung fields and no pleural effusions. There is no evidence of recurrent tumor or new masses.  Impression:  Nichole Smith is doing well following her surgery.  Plan:  I will plan to see her back in 6 months with a repeat chest x-ray.

## 2012-01-12 ENCOUNTER — Telehealth: Payer: Self-pay | Admitting: *Deleted

## 2012-01-12 MED ORDER — TRIAMTERENE-HCTZ 37.5-25 MG PO TABS: 1.0000 | ORAL_TABLET | Freq: Every day | ORAL | Status: DC

## 2012-01-12 MED ORDER — AMLODIPINE BESYLATE 2.5 MG PO TABS: 2.5000 mg | ORAL_TABLET | Freq: Every day | ORAL | Status: DC

## 2012-01-12 MED ORDER — METOPROLOL SUCCINATE ER 100 MG PO TB24: 100.0000 mg | ORAL_TABLET | Freq: Every day | ORAL | Status: DC

## 2012-01-12 NOTE — Telephone Encounter (Signed)
refills  

## 2012-01-12 NOTE — Telephone Encounter (Signed)
Refill on metoprolol/ rx sent to pharmacy

## 2012-01-20 ENCOUNTER — Other Ambulatory Visit: Payer: BC Managed Care – PPO | Admitting: Internal Medicine

## 2012-01-20 ENCOUNTER — Other Ambulatory Visit (INDEPENDENT_AMBULATORY_CARE_PROVIDER_SITE_OTHER): Payer: BC Managed Care – PPO

## 2012-01-20 DIAGNOSIS — Z0000 Encounter for general adult medical examination without abnormal findings: Secondary | ICD-10-CM

## 2012-01-20 LAB — POCT URINALYSIS DIPSTICK
Bilirubin, UA: NEGATIVE
Blood, UA: NEGATIVE
Ketones, UA: NEGATIVE
Leukocytes, UA: NEGATIVE
Spec Grav, UA: 1.015
pH, UA: 8.5

## 2012-01-20 LAB — BASIC METABOLIC PANEL
CO2: 28 mEq/L (ref 19–32)
Calcium: 9.1 mg/dL (ref 8.4–10.5)
Chloride: 100 mEq/L (ref 96–112)
Creatinine, Ser: 0.7 mg/dL (ref 0.4–1.2)
Glucose, Bld: 87 mg/dL (ref 70–99)

## 2012-01-20 LAB — CBC WITH DIFFERENTIAL/PLATELET
Basophils Absolute: 0 10*3/uL (ref 0.0–0.1)
Basophils Relative: 0.4 % (ref 0.0–3.0)
Eosinophils Absolute: 0.2 10*3/uL (ref 0.0–0.7)
Hemoglobin: 15.9 g/dL — ABNORMAL HIGH (ref 12.0–15.0)
MCHC: 33.1 g/dL (ref 30.0–36.0)
MCV: 91.5 fl (ref 78.0–100.0)
Monocytes Absolute: 0.5 10*3/uL (ref 0.1–1.0)
Neutro Abs: 4.3 10*3/uL (ref 1.4–7.7)
Neutrophils Relative %: 57.8 % (ref 43.0–77.0)
RBC: 5.24 Mil/uL — ABNORMAL HIGH (ref 3.87–5.11)
RDW: 13.6 % (ref 11.5–14.6)

## 2012-01-20 LAB — LIPID PANEL
Cholesterol: 154 mg/dL (ref 0–200)
HDL: 51.2 mg/dL (ref >39.00)
Triglycerides: 80 mg/dL (ref 0.0–149.0)
VLDL: 16 mg/dL (ref 0.0–40.0)

## 2012-01-20 LAB — HEPATIC FUNCTION PANEL
Albumin: 3.7 g/dL (ref 3.5–5.2)
Total Protein: 7.1 g/dL (ref 6.0–8.3)

## 2012-01-27 ENCOUNTER — Ambulatory Visit (INDEPENDENT_AMBULATORY_CARE_PROVIDER_SITE_OTHER): Payer: BC Managed Care – PPO | Admitting: Internal Medicine

## 2012-01-27 ENCOUNTER — Encounter: Payer: Self-pay | Admitting: Internal Medicine

## 2012-01-27 VITALS — BP 120/80 | HR 60 | Ht 59.5 in | Wt 220.0 lb

## 2012-01-27 DIAGNOSIS — M2550 Pain in unspecified joint: Secondary | ICD-10-CM

## 2012-01-27 DIAGNOSIS — E039 Hypothyroidism, unspecified: Secondary | ICD-10-CM

## 2012-01-27 DIAGNOSIS — M1990 Unspecified osteoarthritis, unspecified site: Secondary | ICD-10-CM

## 2012-01-27 DIAGNOSIS — E669 Obesity, unspecified: Secondary | ICD-10-CM

## 2012-01-27 DIAGNOSIS — I10 Essential (primary) hypertension: Secondary | ICD-10-CM

## 2012-01-27 DIAGNOSIS — M5430 Sciatica, unspecified side: Secondary | ICD-10-CM

## 2012-01-27 DIAGNOSIS — Z0000 Encounter for general adult medical examination without abnormal findings: Secondary | ICD-10-CM

## 2012-01-27 DIAGNOSIS — M5440 Lumbago with sciatica, unspecified side: Secondary | ICD-10-CM

## 2012-01-27 MED ORDER — SYNTHROID 137 MCG PO TABS: 137.0000 ug | ORAL_TABLET | Freq: Every day | ORAL | Status: DC

## 2012-01-27 NOTE — Progress Notes (Signed)
Subjective:    Patient ID: Nichole Smith, female    DOB: 09-09-1956, 56 y.o.   MRN: [id number]  HPI Patient comes in today for preventive visit and follow-up of medical issues. Update  history since  last visit: Hypertension; has been controlled on current medication Carcinoid of the lung status post resection; reports doing well followed up by surgeon. Obesity: Having a difficult time with decreased activity because of joint issues is not doing Weight Watchers at this time  Hypothyroid on current regimen daily NEW:: Pain  Down  Leg  Right   Off an on.     6 weeks   Hard to go up stairs from pain  . No falliing.  No x ray in past.   Sciatica.  Pain goes from buttocks lower back to back of thigh and no past knee .   Still 3 hours in pool.  norx at this time  Sits a lot at work.  walking ok.  Review of Systems ROS:  GEN/ HEENT: No fever, significant weight changes sweats headaches vision problems hearing changes, CV/ PULM; No chest pain shortness of breath cough, syncope,edema  change in exercise tolerance. GI /GU: No adominal pain, vomiting, change in bowel habits. No blood in the stool. No significant GU symptoms. SKIN/HEME: ,no acute skin rashes suspicious lesions or bleeding. No lymphadenopathy, nodules, masses.  NEURO/ PSYCH:  No neurologic signs such as weakness numbness. No depression anxiety. IMM/ Allergy: No unusual infections.  Allergy .   REST of 12 system review negative except as per HPI   Outpatient Prescriptions Prior to Visit  Medication Sig Dispense Refill  . amLODipine (NORVASC) 2.5 MG tablet Take 1 tablet (2.5 mg total) by mouth daily.  90 tablet  0  . metoprolol succinate (TOPROL-XL) 100 MG 24 hr tablet Take 1 tablet (100 mg total) by mouth daily.  90 tablet  0  . triamterene-hydrochlorothiazide (MAXZIDE-25) 37.5-25 MG per tablet Take 1 each (1 tablet total) by mouth daily.  90 tablet  0  . SYNTHROID 137 MCG tablet TAKE 1 TABLET BY MOUTH EVERY DAY  90 tablet  3  .  cyclobenzaprine (FLEXERIL) 10 MG tablet Take 10 mg by mouth 3 (three) times daily as needed.        . desonide (DESOWEN) 0.05 % lotion Apply to affected area 2 times daily  60 mL  1   Past history family history social history reviewed in the electronic medical record.     Objective:   Physical Exam Wt Readings from Last 3 Encounters:  01/27/12 220 lb (99.791 kg)  10/21/11 220 lb (99.791 kg)  07/23/11 216 lb (97.977 kg)  Physical Exam: Vital signs reviewed NWG:[id number] is a well-developed well-nourished alert cooperative  white female who appears her stated age in no acute distress.  HEENT: normocephalic atraumatic , Eyes: PERRL EOM's full, conjunctiva clear, glasses  Nares: paten,t no deformity discharge or tenderness., Ears: no deformity EAC's clear TMs with normal landmarks. Mouth: clear OP, no lesions, edema.  Moist mucous membranes. Dentition in adequate repair. NECK: supple without masses, thyromegaly or bruits. CHEST/PULM:  Clear to auscultation and percussion breath sounds equal no wheeze , rales or rhonchi. No chest wall deformities or tenderness. CV: PMI is nondisplaced, S1 S2 no gallops, murmurs, rubs. Peripheral pulses are full without delay.No JVD .  ABDOMEN: Bowel sounds normal nontender  No guard or rebound, no hepato splenomegal no CVA tenderness.  No hernia. Extremtities:  No clubbing cyanosis or edema, no acute joint  swelling or redness no focal atrophy NEURO:  Oriented x3, cranial nerves 3-12 appear to be intact, no obvious focal weakness,gait within normal limits some pain with right leg elevation past 90 degrees SKIN: No acute rashes normal turgor, color, no bruising or petechiae. PSYCH: Oriented, good eye contact, no obvious depression anxiety, cognition and judgment appear normal. LN: no cervical axillary inguinal adenopathy Lab Results  Component Value Date   WBC 7.4 01/20/2012   HGB 15.9* 01/20/2012   HCT 48.0* 01/20/2012   PLT 257.0 01/20/2012   GLUCOSE 87 01/20/2012     CHOL 154 01/20/2012   TRIG 80.0 01/20/2012   HDL 51.20 01/20/2012   LDLCALC 87 01/20/2012   ALT 19 01/20/2012   AST 23 01/20/2012   NA 139 01/20/2012   K 4.3 01/20/2012   CL 100 01/20/2012   CREATININE 0.7 01/20/2012   BUN 20 01/20/2012   CO2 28 01/20/2012   TSH 1.85 01/20/2012   INR 0.96 06/11/2011       Assessment & Plan:  Preventive Health Care Counseled regarding healthy nutrition, exercise, sleep, injury prevention, calcium vit d and healthy weight . utd has gyne   HT no change in medication doing well Hypothyroid at goal no change refill medication Obesity  still struggling problematic with joint pain Back pain with sciatica on the right no other alarm features check chest x-ray and consider physical therapy can cautiously use Aleve anti-inflammatory for about a week and then as needed  Status post resection of carcinoid tumor of lung presumed cured followup by surgeon

## 2012-01-27 NOTE — Patient Instructions (Addendum)
Try aleve 2 twice a day for a week and then as need. Then  Call and we can refer for physicaltherapy.  If appropriate .  Get plain x ray of back in the meantime  Losing weight will help  All of the above.   Plan follow up depending on progress or  6 months -12 months

## 2012-01-28 ENCOUNTER — Ambulatory Visit (INDEPENDENT_AMBULATORY_CARE_PROVIDER_SITE_OTHER)
Admission: RE | Admit: 2012-01-28 | Discharge: 2012-01-28 | Disposition: A | Discharge status: Home / Self Care | Payer: BC Managed Care – PPO | Source: Ambulatory Visit | Attending: Internal Medicine | Admitting: Internal Medicine

## 2012-01-28 DIAGNOSIS — M5430 Sciatica, unspecified side: Secondary | ICD-10-CM

## 2012-01-28 DIAGNOSIS — M5440 Lumbago with sciatica, unspecified side: Secondary | ICD-10-CM

## 2012-01-29 NOTE — Progress Notes (Signed)
Quick Note:  Left message to call back. ______ 

## 2012-01-30 NOTE — Progress Notes (Signed)
Quick Note:  Pt aware of results and is going to wait until next week to decide on doing PT. She will call us back and let us know. ______

## 2012-04-06 ENCOUNTER — Other Ambulatory Visit: Payer: Self-pay | Admitting: Internal Medicine

## 2012-04-10 IMAGING — CT NM PET TUM IMG INITIAL (PI) SKULL BASE T - THIGH
6 series · 25 of 25 positions shown · IV contrast ([ID])
Comparison: CT chest 05/28/2011

CLINICAL DATA: Initial treatment strategy for lung mass.

NUCLEAR MEDICINE PET CT INITIAL (PI) SKULL BASE TO THIGH
TECHNIQUE: 17.5 mCi F-18 FDG was injected intravenously via the
left antecubital fossa.  Full-ring PET imaging was performed from
the skull base through the mid-thighs 68  minutes after injection.
CT data was obtained and used for attenuation correction and
anatomic localization only.  (This was not acquired as a diagnostic
CT examination.)
Fasting Blood Glucose:  121

[Series 1: pet ac · axial · 3.3mm · 4.69mm/px · z∈[-743,-17]mm · 5 of 223 slices shown]
[im 1/223]
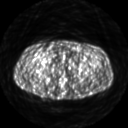
[im 56/223]
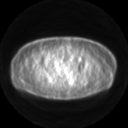
[im 112/223]
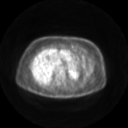
[im 167/223]
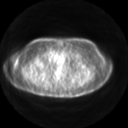
[im 223/223]
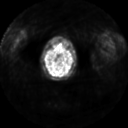

[Series 2: pet nac · axial · 3.3mm · 4.69mm/px · z∈[-743,-17]mm · 6 of 223 slices shown]
[im 1/223]
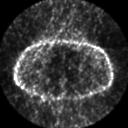
[im 45/223]
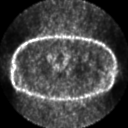
[im 89/223]
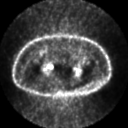
[im 134/223]
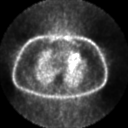
[im 178/223]
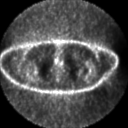
[im 223/223]
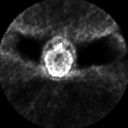

[Series 2: ct images · axial · 3.8mm · 0.98mm/px · z∈[-743,-18]mm · 5 of 223 slices shown]
[im 1/223]
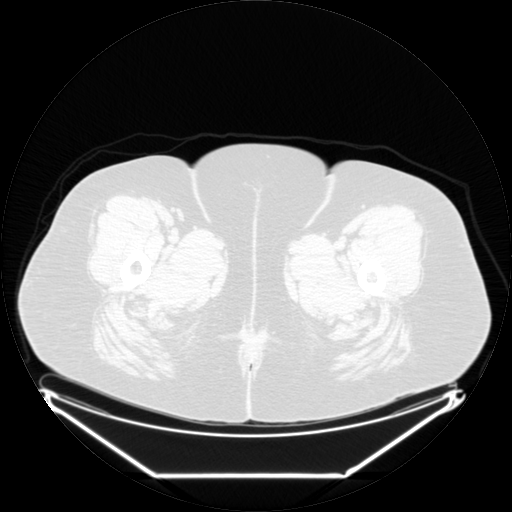
[im 56/223]
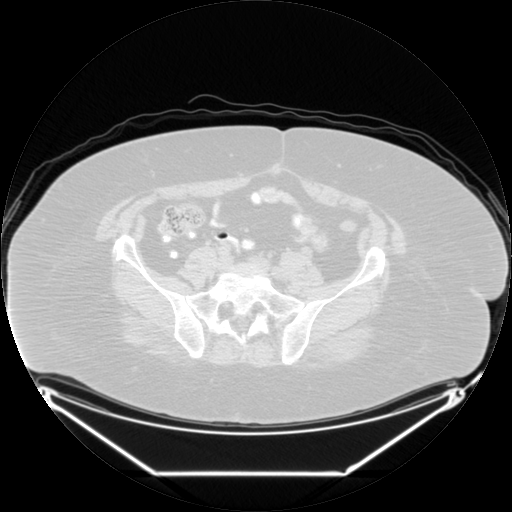
[im 112/223]
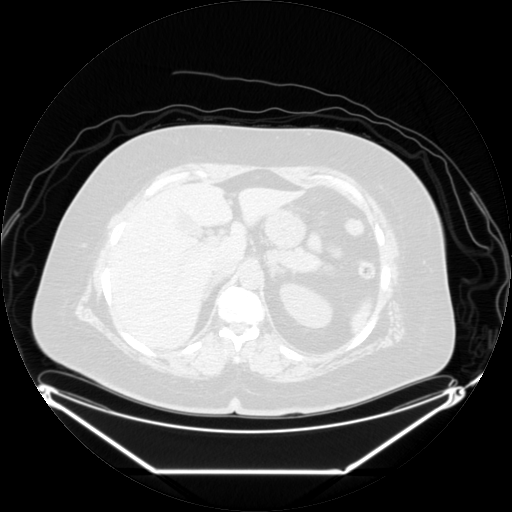
[im 167/223]
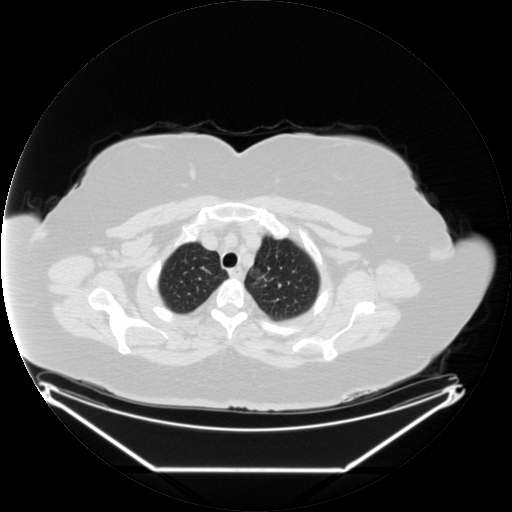
[im 223/223  brain]
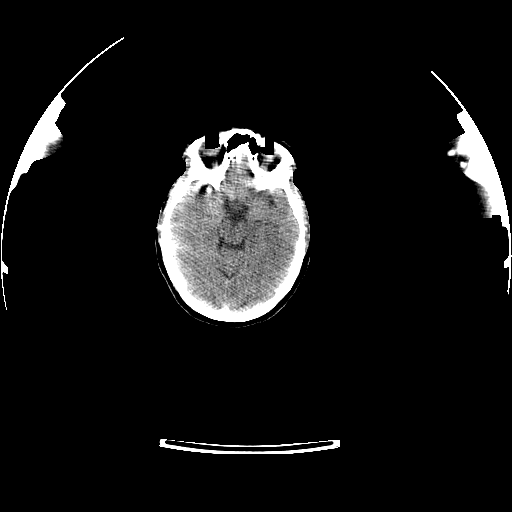

[Series 123: mip · coronal · 3.3mm · 4.69mm/px · 1 of 30 slices shown]
[im 1/30]
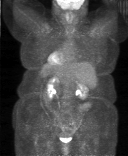

[Series 151: reformatted · axial · 3.3mm · 3.91mm/px · z∈[-743,-17]mm · 6 of 221 slices shown (1 of 2)]
[im 1/221]
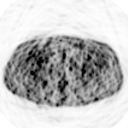
[im 45/221]
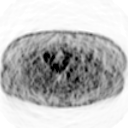
[im 89/221]
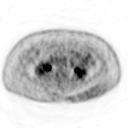
[im 133/221]
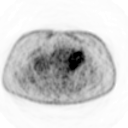
[im 177/221]
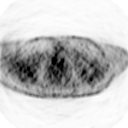
[im 221/221]
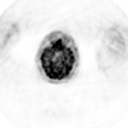

[Series 153: reformatted · coronal · 4.7mm · 5.83mm/px · 2 of 68 slices shown (2 of 2)]
[im 1/68]
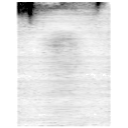
[im 68/68]
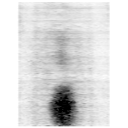

[25 of 25 positions shown; findings below may reference images not displayed]

FINDINGS: Uptake is seen in both lobes of the thyroid.  A centrally
positioned right middle lobe nodule has an S U V max of 5.0 (fused
image 82).  No additional areas of abnormal hypermetabolism in the
neck, chest, abdomen or pelvis.

CT images were performed for attenuation correction.  No acute
findings in the neck.  Interpretation of CT images of the chest
will be deferred to diagnostic examination performed 05/28/2011.
No intervening pericardial or pleural effusion.  No acute findings
in the abdomen or pelvis.  No free fluid.  Probable bone island in
the right sacrum.
IMPRESSION: 1.  Hypermetabolic right middle lobe nodule is most consistent with
primary bronchogenic carcinoma.  No evidence of metastatic disease.
2.  Uptake in the thyroid.  Correlation with thyroid function tests
would be helpful in further evaluation, as clinically indicated.

## 2012-04-15 ENCOUNTER — Other Ambulatory Visit: Payer: Self-pay | Admitting: Surgery

## 2012-04-15 DIAGNOSIS — D381 Neoplasm of uncertain behavior of trachea, bronchus and lung: Secondary | ICD-10-CM

## 2012-04-20 ENCOUNTER — Ambulatory Visit (INDEPENDENT_AMBULATORY_CARE_PROVIDER_SITE_OTHER): Payer: BC Managed Care – PPO | Admitting: Surgery

## 2012-04-20 ENCOUNTER — Ambulatory Visit: Payer: BC Managed Care – PPO | Admitting: Surgery

## 2012-04-20 ENCOUNTER — Ambulatory Visit
Admission: RE | Admit: 2012-04-20 | Discharge: 2012-04-20 | Disposition: A | Discharge status: Home / Self Care | Payer: BC Managed Care – PPO | Source: Ambulatory Visit | Attending: Surgery | Admitting: Surgery

## 2012-04-20 ENCOUNTER — Encounter: Payer: Self-pay | Admitting: Surgery

## 2012-04-20 VITALS — BP 145/81 | HR 67 | Resp 18 | Ht 60.0 in | Wt 225.0 lb

## 2012-04-20 DIAGNOSIS — D381 Neoplasm of uncertain behavior of trachea, bronchus and lung: Secondary | ICD-10-CM

## 2012-04-20 DIAGNOSIS — D3A090 Benign carcinoid tumor of the bronchus and lung: Secondary | ICD-10-CM

## 2012-04-20 NOTE — Progress Notes (Signed)
                   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 Since I last saw her she has been feeling well. She denies any cough or sputum production. She's had no hemoptysis.  Current Outpatient Prescriptions  Medication Sig Dispense Refill  . amLODipine (NORVASC) 2.5 MG tablet TAKE 1 TABLET EVERY DAY  90 tablet  2  . cyclobenzaprine (FLEXERIL) 10 MG tablet Take 10 mg by mouth 3 (three) times daily as needed.        . metoprolol succinate (TOPROL-XL) 100 MG 24 hr tablet TAKE 1 TABLET EVERY DAY  90 tablet  2  . SYNTHROID 137 MCG tablet Take 1 tablet (137 mcg total) by mouth daily.  90 tablet  3  . triamterene-hydrochlorothiazide (MAXZIDE-25) 37.5-25 MG per tablet TAKE 1 TABLET EVERY DAY  90 tablet  2     Physical Exam:  BP 145/81  Pulse 67  Resp 18  Ht 5' (1.524 m)  Wt 225 lb (102.059 kg)  BMI 43.94 kg/m2  SpO2 95% She looks well.  There is no cervical or supraclavicular adenopathy. Lungs sound clear. The right thoracotomy incision is well healed. There are no skin lesions. Cardiac exam shows regular rate and rhythm with normal heart sounds.  Diagnostic Tests:  RADIOLOGY REPORT*   Clinical Data: Follow-up right lung surgery.   CHEST - 2 VIEW   Comparison: 10/21/2011.   Findings: The cardiac silhouette, mediastinal and hilar contours are within normal limits and stable.  Stable surgical changes in the right infrahilar region.  No pulmonary mass lesions or nodules. No pleural effusion.  The bony thorax is intact.   IMPRESSION:   1.  Stable surgical changes. 2.  No acute pulmonary findings or evidence for recurrent tumor or metastatic disease.   Original Report Authenticated By: P. Loralie Champagne, M.D.   Impression:  She continues to do well following resection  of a right middle lobe carcinoid tumor.  Plan:  I'll plan to see her back in 6 months with a CT scan of the chest.

## 2012-05-25 ENCOUNTER — Ambulatory Visit (INDEPENDENT_AMBULATORY_CARE_PROVIDER_SITE_OTHER): Payer: BC Managed Care – PPO

## 2012-05-25 DIAGNOSIS — Z2911 Encounter for prophylactic immunotherapy for respiratory syncytial virus (RSV): Secondary | ICD-10-CM

## 2012-05-25 DIAGNOSIS — Z0000 Encounter for general adult medical examination without abnormal findings: Secondary | ICD-10-CM

## 2012-06-21 ENCOUNTER — Encounter: Payer: Self-pay | Admitting: Internal Medicine

## 2012-08-12 ENCOUNTER — Ambulatory Visit (INDEPENDENT_AMBULATORY_CARE_PROVIDER_SITE_OTHER): Payer: BC Managed Care – PPO

## 2012-08-12 DIAGNOSIS — Z23 Encounter for immunization: Secondary | ICD-10-CM

## 2012-09-21 ENCOUNTER — Other Ambulatory Visit: Payer: Self-pay | Admitting: Surgery

## 2012-09-21 DIAGNOSIS — C3490 Malignant neoplasm of unspecified part of unspecified bronchus or lung: Secondary | ICD-10-CM

## 2012-09-21 DIAGNOSIS — D3A090 Benign carcinoid tumor of the bronchus and lung: Secondary | ICD-10-CM

## 2012-10-26 ENCOUNTER — Ambulatory Visit
Admission: RE | Admit: 2012-10-26 | Discharge: 2012-10-26 | Disposition: A | Discharge status: Home / Self Care | Payer: BC Managed Care – PPO | Source: Ambulatory Visit | Attending: Surgery | Admitting: Surgery

## 2012-10-26 ENCOUNTER — Ambulatory Visit (INDEPENDENT_AMBULATORY_CARE_PROVIDER_SITE_OTHER): Payer: BC Managed Care – PPO | Admitting: Surgery

## 2012-10-26 ENCOUNTER — Encounter: Payer: Self-pay | Admitting: Surgery

## 2012-10-26 VITALS — BP 157/86 | HR 64 | Resp 18 | Ht 60.0 in | Wt 225.0 lb

## 2012-10-26 DIAGNOSIS — C3490 Malignant neoplasm of unspecified part of unspecified bronchus or lung: Secondary | ICD-10-CM

## 2012-10-26 DIAGNOSIS — Z9889 Other specified postprocedural states: Secondary | ICD-10-CM

## 2012-10-26 DIAGNOSIS — D3A090 Benign carcinoid tumor of the bronchus and lung: Secondary | ICD-10-CM

## 2012-10-26 DIAGNOSIS — Z09 Encounter for follow-up examination after completed treatment for conditions other than malignant neoplasm: Secondary | ICD-10-CM

## 2012-10-26 DIAGNOSIS — Z902 Acquired absence of lung [part of]: Secondary | ICD-10-CM

## 2012-10-26 NOTE — Progress Notes (Signed)
                   [address]            [place] [address]          [phone]      HPI:  The patient returns today for followup status post right thoracotomy and right middle lobectomy on 06/13/2011 for a T1b carcinoid tumor. Since I last saw her she has been feeling well. She did have a fall last week striking her right orbital region but did not injure anything seriously. She denies any cough or sputum production. She has had no hemoptysis. She denies any chest pain.  Current Outpatient Prescriptions  Medication Sig Dispense Refill  . amLODipine (NORVASC) 2.5 MG tablet TAKE 1 TABLET EVERY DAY  90 tablet  2  . cyclobenzaprine (FLEXERIL) 10 MG tablet Take 10 mg by mouth 3 (three) times daily as needed.        . metoprolol succinate (TOPROL-XL) 100 MG 24 hr tablet TAKE 1 TABLET EVERY DAY  90 tablet  2  . SYNTHROID 137 MCG tablet Take 1 tablet (137 mcg total) by mouth daily.  90 tablet  3  . triamterene-hydrochlorothiazide (MAXZIDE-25) 37.5-25 MG per tablet TAKE 1 TABLET EVERY DAY  90 tablet  2     Physical Exam: BP 157/86  Pulse 64  Resp 18  Ht 5' (1.524 m)  Wt 225 lb (102.059 kg)  BMI 43.94 kg/m2  SpO2 96% She looks well. She has an ecchymosis over the right orbital region and cheek. There is no cervical or supraclavicular adenopathy. Lung exam is clear. The right thoracotomy scar is well-healed with no skin lesions present.  Diagnostic Tests:  *RADIOLOGY REPORT*   Clinical Data: History of right lung carcinoid resection.   CT CHEST WITHOUT CONTRAST   Technique:  Multidetector CT imaging of the chest was performed following the standard protocol without IV contrast.   Comparison: PET CT 06/06/2011 and chest CT 05/28/2011.   Findings: The chest wall is unremarkable.  No breast masses, supraclavicular or axillary lymphadenopathy.  The thyroid gland is grossly normal.  The bony thorax is intact.  No destructive bone lesions or spinal canal  compromise.  Moderate degenerative changes involving the thoracic spine.   The heart is normal in size.  No pericardial effusion.  No mediastinal or hilar lymphadenopathy.  There are calcified mediastinal and hilar lymph nodes consistent with remote granulomatous disease.  The esophagus is grossly normal.  The aorta is normal in caliber.   Examination of the lung parenchyma demonstrates stable surgical changes from a right lung resection.  No CT findings to suggest residual or recurrent tumor.  No metastatic pulmonary nodules.  No pleural effusion.   The upper abdomen is unremarkable.   IMPRESSION:   1.  Expected postoperative changes involving the right hemithorax. No CT findings for residual or recurrent tumor and no findings for metastatic pulmonary disease. 2.  Evidence of remote granulomatous disease with right lower lobe calcified granuloma and calcified mediastinal and hilar lymph nodes.     Original Report Authenticated By: Rudie Meyer, M.D.   Impression:  She continues to do well following right middle lobectomy for carcinoid tumor. There is no evidence of recurrence.  Plan:  I will plan to see her back in 6 months and we'll repeat her chest x-ray at that time.

## 2012-12-28 ENCOUNTER — Other Ambulatory Visit: Payer: Self-pay | Admitting: Internal Medicine

## 2013-01-14 ENCOUNTER — Other Ambulatory Visit: Payer: Self-pay | Admitting: Internal Medicine

## 2013-01-26 ENCOUNTER — Other Ambulatory Visit (INDEPENDENT_AMBULATORY_CARE_PROVIDER_SITE_OTHER): Payer: BC Managed Care – PPO

## 2013-01-26 DIAGNOSIS — Z0000 Encounter for general adult medical examination without abnormal findings: Secondary | ICD-10-CM

## 2013-01-26 LAB — LIPID PANEL
Cholesterol: 144 mg/dL (ref 0–200)
VLDL: 13.8 mg/dL (ref 0.0–40.0)

## 2013-01-26 LAB — BASIC METABOLIC PANEL
BUN: 20 mg/dL (ref 6–23)
CO2: 31 mEq/L (ref 19–32)
Chloride: 101 mEq/L (ref 96–112)
Glucose, Bld: 98 mg/dL (ref 70–99)
Potassium: 4.2 mEq/L (ref 3.5–5.1)

## 2013-01-26 LAB — HEPATIC FUNCTION PANEL
ALT: 23 U/L (ref 0–35)
Total Bilirubin: 0.7 mg/dL (ref 0.3–1.2)
Total Protein: 7.1 g/dL (ref 6.0–8.3)

## 2013-01-26 LAB — CBC WITH DIFFERENTIAL/PLATELET
Eosinophils Relative: 2.9 % (ref 0.0–5.0)
HCT: 46.1 % — ABNORMAL HIGH (ref 36.0–46.0)
Lymphs Abs: 2.7 10*3/uL (ref 0.7–4.0)
MCHC: 33.7 g/dL (ref 30.0–36.0)
MCV: 90.2 fl (ref 78.0–100.0)
Monocytes Absolute: 0.5 10*3/uL (ref 0.1–1.0)
Platelets: 245 10*3/uL (ref 150.0–400.0)
WBC: 7.8 10*3/uL (ref 4.5–10.5)

## 2013-01-26 LAB — TSH: TSH: 3.35 u[IU]/mL (ref 0.35–5.50)

## 2013-02-02 ENCOUNTER — Encounter: Payer: Self-pay | Admitting: Internal Medicine

## 2013-02-02 ENCOUNTER — Ambulatory Visit (INDEPENDENT_AMBULATORY_CARE_PROVIDER_SITE_OTHER): Payer: BC Managed Care – PPO | Admitting: Internal Medicine

## 2013-02-02 VITALS — BP 144/90 | HR 77 | Temp 98.6°F | Ht 59.75 in | Wt 230.0 lb

## 2013-02-02 DIAGNOSIS — E669 Obesity, unspecified: Secondary | ICD-10-CM

## 2013-02-02 DIAGNOSIS — M25512 Pain in left shoulder: Secondary | ICD-10-CM

## 2013-02-02 DIAGNOSIS — Z0000 Encounter for general adult medical examination without abnormal findings: Secondary | ICD-10-CM

## 2013-02-02 DIAGNOSIS — I10 Essential (primary) hypertension: Secondary | ICD-10-CM

## 2013-02-02 DIAGNOSIS — M25519 Pain in unspecified shoulder: Secondary | ICD-10-CM

## 2013-02-02 DIAGNOSIS — Z791 Long term (current) use of non-steroidal anti-inflammatories (NSAID): Secondary | ICD-10-CM

## 2013-02-02 DIAGNOSIS — E039 Hypothyroidism, unspecified: Secondary | ICD-10-CM

## 2013-02-02 DIAGNOSIS — M722 Plantar fascial fibromatosis: Secondary | ICD-10-CM | POA: Insufficient documentation

## 2013-02-02 MED ORDER — AMLODIPINE BESYLATE 5 MG PO TABS: 5.0000 mg | ORAL_TABLET | Freq: Every day | ORAL | Status: DC

## 2013-02-02 NOTE — Progress Notes (Signed)
Chief Complaint  Patient presents with  . Annual Exam    HPI: Patient comes in today for Preventive Health Care visit  No major change in health status since last visit . Ongoing issues   BP  :  Up some after gaining weight change in [employer] activity and daughter moving back home  Job layoff.  Left shoulder: taking a lot of aleve     To go back to ortho  Heart flutter  Off an on no pain lasted  Cough didn't help. Weight  Some weight  Gain.   Left foot  Plantar faccitis  Inserts ll discrep  Bother her  Seeing podiatry  Asks about other options  Lung tumor  Carcinoid  No recurrence on q 6- 12 months follow .  ROS:  GEN/ HEENT: No fever, significant weight changes sweats headaches vision problems hearing changes, CV/ PULM; No chest pain shortness of breath cough, syncope,edema  change in exercise tolerance. GI /GU: No adominal pain, vomiting, change in bowel habits. No blood in the stool. No significant GU symptoms. SKIN/HEME: ,no acute skin rashes suspicious lesions or bleeding. No lymphadenopathy, nodules, masses.  NEURO/ PSYCH:  No neurologic signs such as weakness numbness. No depression anxiety. IMM/ Allergy: No unusual infections.  Allergy .   REST of 12 system review negative except as per HPI   Past Medical History  Diagnosis Date  . GERD (gastroesophageal reflux disease)   . Hypertension   . Arthritis   . Carcinoid tumor of lung 2012    right found incidentally on chest xray eval for r/o vasxculitis   . 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 HNP (herniated nucleus pulposus), cervical 01/22/2011    C6-C7  Has seen dr Venetia Maxon for this and Dr Shelle Iron  Positional numbness in hands no weakness      Family History  Problem Relation Age of Onset  . Stroke Mother     died age 2  . Hypertension Mother   . Coronary artery disease Father     died age 62  . Hypertension Maternal Uncle   . Hypertension Maternal Uncle     History   Social History  . Marital Status: Married   Spouse Name: N/A    Number of Children: N/A  . Years of Education: N/A   Social History Main Topics  . Smoking status: Never Smoker   . Smokeless tobacco: None  . Alcohol Use: No  . Drug Use: No  . Sexually Active: None   Other Topics Concern  . None   Social History Narrative   hhof 2    2 Children at college and  beyond      [employer] full time   Married     Outpatient Encounter Prescriptions as of 02/02/2013  Medication Sig Dispense Refill  . metoprolol succinate (TOPROL-XL) 100 MG 24 hr tablet TAKE 1 TABLET EVERY DAY  90 tablet  0  . SYNTHROID 137 MCG tablet TAKE 1 TABLET (137 MCG TOTAL) BY MOUTH DAILY.  90 tablet  0  . triamterene-hydrochlorothiazide (MAXZIDE-25) 37.5-25 MG per tablet TAKE 1 TABLET EVERY DAY  90 tablet  0  . [DISCONTINUED] amLODipine (NORVASC) 2.5 MG tablet TAKE 1 TABLET EVERY DAY  90 tablet  0  . amLODipine (NORVASC) 5 MG tablet Take 1 tablet (5 mg total) by mouth daily.  90 tablet  3  . [DISCONTINUED] cyclobenzaprine (FLEXERIL) 10 MG tablet Take 10 mg by mouth 3 (three) times daily as needed.  No facility-administered encounter medications on file as of 02/02/2013.    EXAM:  BP 144/90  Pulse 77  Temp(Src) 98.6 F (37 C) (Oral)  Ht 4' 11.75" (1.518 m)  Wt 230 lb (104.327 kg)  BMI 45.27 kg/m2  SpO2 97%  Body mass index is 45.27 kg/(m^2). Wt Readings from Last 3 Encounters:  02/02/13 230 lb (104.327 kg)  10/26/12 225 lb (102.059 kg)  04/20/12 225 lb (102.059 kg)    Physical Exam: Vital signs reviewed [id number] is a well-developed well-nourished alert cooperative   female who appears her stated age in no acute distress.  HEENT: normocephalic atraumatic , Eyes: PERRL EOM's full, conjunctiva clear, Nares: paten,t no deformity discharge or tenderness., Ears: no deformity EAC's clear TMs with normal landmarks. Mouth: clear OP, no lesions, edema.  Moist mucous membranes. Dentition in adequate repair. NECK: supple without masses,  thyromegaly or bruits. CHEST/PULM:  Clear to auscultation and percussion breath sounds equal no wheeze , rales or rhonchi. No chest wall deformities or tenderness. Breast: normal by inspection . No dimpling, discharge, masses, tenderness or discharge . CV: PMI is nondisplaced, S1 S2 no gallops, murmurs, rubs. Peripheral pulses are full without delay.No JVD .  ABDOMEN: Bowel sounds normal nontender  No guard or rebound, no hepato splenomegal no CVA tenderness.  No hernia. Extremtities:  No clubbing cyanosis or edema, no acute joint swelling or redness no focal atrophy left foot no Zivah Mayr defects high arch  Pulses nl  NEURO:  Oriented x3, cranial nerves 3-12 appear to be intact, no obvious focal weakness,gait within normal limits no abnormal reflexes or asymmetrical SKIN: No acute rashes normal turgor, color, no bruising or petechiae. PSYCH: Oriented, good eye contact, no obvious depression anxiety, cognition and judgment appear normal. LN: no cervical axillary inguinal adenopathy  Lab Results  Component Value Date   WBC 7.8 01/26/2013   HGB 15.5* 01/26/2013   HCT 46.1* 01/26/2013   PLT 245.0 01/26/2013   GLUCOSE 98 01/26/2013   CHOL 144 01/26/2013   TRIG 69.0 01/26/2013   HDL 43.70 01/26/2013   LDLCALC 87 01/26/2013   ALT 23 01/26/2013   AST 22 01/26/2013   NA 139 01/26/2013   K 4.2 01/26/2013   CL 101 01/26/2013   CREATININE 0.7 01/26/2013   BUN 20 01/26/2013   CO2 31 01/26/2013   TSH 3.35 01/26/2013   INR 0.96 06/11/2011    ASSESSMENT AND PLAN:  Discussed the following assessment and plan:  Visit for preventive health examination  HYPOTHYROIDISM  HYPERTENSION - up today poss related to weight gain and  inactivity also nsaid use for shoulder had cough with acei ? if ever had arb consider change meds a appr  Plantar fasciitis of left foot - poss ll discr   consider seeing SM  for gait eval .  NSAID long-term use - shoulder recently  Left shoulder pain - to see dr caffrey   Obesity,  unspecified S/P right mimithoracotomy and right middle lobectomy on 06/13/11 for T1b low grade carcinoid tumor      Last ct fall 2013 and no recurrence   b blocker  Has been used for palpitations but  Consider  Arb if appropriate  In future  Increase the amlodipine for now to 5 mg  Had  Better control after losing weight in the past. Patient Care Team: Madelin Headings, MD as PCP - General Elmon Else, MD (Dermatology) Annamaria Boots, MD as Attending Physician (Gynecology) Hart Carwin, MD (Gastroenterology) Alleen Borne,  MD (Cardiothoracic Surgery) W D Carloyn Manner., MD as Attending Physician (Orthopedic Surgery) Patient Instructions  blood pressure monitoring and weight loss.  In a healthy manner is advised .   Increase norvasc  To 5 mg per day.    Plan FU in 2 months . 2 follow blood pressure medication Or as needed.   Contact us in the meantime if other concerns or worsening of conditions.  History that Aleve can interfere with blood pressure medication affect your kidneys appear to be normally functioning at this time.      Neta Mends. Panosh M.D.  Health Maintenance  Topic Date Due  . Influenza Vaccine  08/01/2013  . Mammogram  06/14/2014  . Pap Smear  05/04/2015  . Colonoscopy  08/23/2017  . Tetanus/tdap  10/16/2018   Health Maintenance Review

## 2013-02-02 NOTE — Patient Instructions (Addendum)
blood pressure monitoring and weight loss.  In a healthy manner is advised .   Increase norvasc  To 5 mg per day.    Plan FU in 2 months . 2 follow blood pressure medication Or as needed.   Contact us in the meantime if other concerns or worsening of conditions.  History that Aleve can interfere with blood pressure medication affect your kidneys appear to be normally functioning at this time.

## 2013-02-04 ENCOUNTER — Encounter: Payer: Self-pay | Admitting: Internal Medicine

## 2013-02-04 DIAGNOSIS — Z791 Long term (current) use of non-steroidal anti-inflammatories (NSAID): Secondary | ICD-10-CM | POA: Insufficient documentation

## 2013-02-04 DIAGNOSIS — M25512 Pain in left shoulder: Secondary | ICD-10-CM | POA: Insufficient documentation

## 2013-04-07 ENCOUNTER — Ambulatory Visit: Payer: BC Managed Care – PPO | Admitting: Internal Medicine

## 2013-04-10 ENCOUNTER — Other Ambulatory Visit: Payer: Self-pay | Admitting: Internal Medicine

## 2013-04-14 ENCOUNTER — Ambulatory Visit (INDEPENDENT_AMBULATORY_CARE_PROVIDER_SITE_OTHER): Payer: BC Managed Care – PPO | Admitting: Internal Medicine

## 2013-04-14 ENCOUNTER — Encounter: Payer: Self-pay | Admitting: Internal Medicine

## 2013-04-14 VITALS — BP 134/90 | HR 79 | Temp 98.5°F | Wt 230.0 lb

## 2013-04-14 DIAGNOSIS — M5020 Other cervical disc displacement, unspecified cervical region: Secondary | ICD-10-CM

## 2013-04-14 DIAGNOSIS — M542 Cervicalgia: Secondary | ICD-10-CM

## 2013-04-14 DIAGNOSIS — M2550 Pain in unspecified joint: Secondary | ICD-10-CM

## 2013-04-14 DIAGNOSIS — E039 Hypothyroidism, unspecified: Secondary | ICD-10-CM

## 2013-04-14 DIAGNOSIS — I10 Essential (primary) hypertension: Secondary | ICD-10-CM

## 2013-04-14 MED ORDER — TRIAMTERENE-HCTZ 37.5-25 MG PO TABS: ORAL_TABLET | ORAL | Status: DC

## 2013-04-14 NOTE — Patient Instructions (Addendum)
tsh today will  Let  You know results.  For now no change in BP medications  Eventually we can decrase the toprol and consider change to losartan/hctz and stop the  maxide . But  Will plan this in the future .

## 2013-04-14 NOTE — Progress Notes (Signed)
Chief Complaint  Patient presents with  . Follow-up    HPI: Fu BP   readings the patient has obtained has been at goal mostly during the day in the afternoon however in the evening rises somewhat because she thinks she's in pain. Caution about medications including her beta blocker that she's been on for many years originally for palpitations but also have her blood pressure Following bp and normal 115 120 range over 70- 80     Musculoskeletal pain A bit better    Ms pain neck and shoulder rotator cuff  has seen her orthopedist who put her on low dose meloxicam. Not using Aleve at this time Was hurting everywhere   On new medicine for aches and pains   Advised    injecitons  But planning  Pt and meds .   Has a history of traction in the past that helped  ? Check  TSH     Due for a refill .  And TSH was borderline last time. If continues would rather increase the dose. Rash   like she had with vasculitis Flared   up   Lasted 2 weeks. And now is gone dermatologist said watch it possibly a problem just with the skin if recurrent may rebiopsy I   ROS: See pertinent positives and negatives per HPI. No current chest pain shortness of breath fainting. Some edema not increased  Past Medical History  Diagnosis Date  . GERD (gastroesophageal reflux disease)   . Hypertension   . Arthritis   . Carcinoid tumor of lung 2012    right found incidentally on chest xray eval for r/o vasxculitis   . 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 HNP (herniated nucleus pulposus), cervical 01/22/2011    C6-C7  Has seen dr Venetia Maxon for this and Dr Shelle Iron  Positional numbness in hands no weakness      Family History  Problem Relation Age of Onset  . Stroke Mother     died age 50  . Hypertension Mother   . Coronary artery disease Father     died age 37  . Hypertension Maternal Uncle   . Hypertension Maternal Uncle     History   Social History  . Marital Status: Married    Spouse Name: N/A    Number of  Children: N/A  . Years of Education: N/A   Social History Main Topics  . Smoking status: Never Smoker   . Smokeless tobacco: None  . Alcohol Use: No  . Drug Use: No  . Sexually Active: None   Other Topics Concern  . None   Social History Narrative   hhof 2    2 Children at college and  beyond      [employer] full time   Married     Outpatient Encounter Prescriptions as of 04/14/2013  Medication Sig Dispense Refill  . amLODipine (NORVASC) 5 MG tablet Take 1 tablet (5 mg total) by mouth daily.  90 tablet  3  . meloxicam (MOBIC) 7.5 MG tablet Take 7.5 mg by mouth daily.      . metoprolol succinate (TOPROL-XL) 100 MG 24 hr tablet TAKE 1 TABLET DAILY  90 tablet  1  . SYNTHROID 137 MCG tablet TAKE 1 TABLET (137 MCG TOTAL) BY MOUTH DAILY.  90 tablet  0  . triamterene-hydrochlorothiazide (MAXZIDE-25) 37.5-25 MG per tablet TAKE 1 TABLET EVERY DAY  90 tablet  3  . [DISCONTINUED] triamterene-hydrochlorothiazide (MAXZIDE-25) 37.5-25 MG per tablet TAKE 1 TABLET EVERY  DAY  90 tablet  0  . clobetasol cream (TEMOVATE) 0.05 %       . nystatin-triamcinolone (MYCOLOG II) cream        No facility-administered encounter medications on file as of 04/14/2013.    EXAM:  BP 134/90  Pulse 79  Temp(Src) 98.5 F (36.9 C) (Oral)  Wt 230 lb (104.327 kg)  BMI 45.27 kg/m2  SpO2 94%  Body mass index is 45.27 kg/(m^2). Wt Readings from Last 3 Encounters:  04/14/13 230 lb (104.327 kg)  02/02/13 230 lb (104.327 kg)  10/26/12 225 lb (102.059 kg)    GENERAL: vitals reviewed and listed above, alert, oriented, appears well hydrated and in no acute distress Blood pressure readings reviewed at goal HEENT: atraumatic, conjunctiva  clear, no obvious abnormalities on inspection of external nose and ears   NECK: no obvious masses on inspection palpation no bruits are heard no JVD  LUNGS: clear to auscultation bilaterally, no wheezes, rales or rhonchi,   CV: HRRR, no clubbing cyanosis  trace  peripheral edema nl cap refill  Abdomen soft without organomegaly guarding or rebound MS: moves all extremities  gait within normal limits  PSYCH: pleasant and cooperative, no obvious depression or anxiety  ASSESSMENT AND PLAN:  Discussed the following assessment and plan:  HYPOTHYROIDISM - Plan: clobetasol cream (TEMOVATE) 0.05 %, nystatin-triamcinolone (MYCOLOG II) cream, meloxicam (MOBIC) 7.5 MG tablet, TSH  Pain in joint, multiple sites  NECK AND BACK PAIN  HNP (herniated nucleus pulposus), cervical  HYPERTENSION - In control at home increase with pain. At this time is pretty steady and controlled address the pain first consider readjusting medicines  Considerations in the future if changing to Hyzaar 50/12.5 and decreasing the beta blocker and stopping the Maxide. Following the potassium level. Repeat her TSH today can increased dose is still 3 and above  as about a week left of her medication. -Patient advised to return or notify health care team  if symptoms worsen or persist or new concerns arise.  Patient Instructions  tsh today will  Let  You know results.  For now no change in BP medications  Eventually we can decrase the toprol and consider change to losartan/hctz and stop the  maxide . But  Will plan this in the future .    Neta Mends. Starkisha Tullis M.D.

## 2013-04-15 ENCOUNTER — Ambulatory Visit: Payer: BC Managed Care – PPO | Admitting: Physical Therapy

## 2013-04-15 ENCOUNTER — Other Ambulatory Visit: Payer: Self-pay | Admitting: Family Medicine

## 2013-04-15 DIAGNOSIS — M25519 Pain in unspecified shoulder: Secondary | ICD-10-CM

## 2013-04-15 DIAGNOSIS — M2550 Pain in unspecified joint: Secondary | ICD-10-CM

## 2013-04-15 DIAGNOSIS — M4802 Spinal stenosis, cervical region: Secondary | ICD-10-CM

## 2013-04-15 MED ORDER — SYNTHROID 137 MCG PO TABS: ORAL_TABLET | ORAL | Status: DC

## 2013-04-18 ENCOUNTER — Encounter: Payer: BC Managed Care – PPO | Admitting: Physical Therapy

## 2013-04-18 ENCOUNTER — Other Ambulatory Visit: Payer: Self-pay | Admitting: Internal Medicine

## 2013-04-18 DIAGNOSIS — M25519 Pain in unspecified shoulder: Secondary | ICD-10-CM

## 2013-04-18 DIAGNOSIS — M4802 Spinal stenosis, cervical region: Secondary | ICD-10-CM

## 2013-04-18 DIAGNOSIS — M2550 Pain in unspecified joint: Secondary | ICD-10-CM

## 2013-04-20 ENCOUNTER — Encounter: Payer: BC Managed Care – PPO | Admitting: Physical Therapy

## 2013-04-20 DIAGNOSIS — M25519 Pain in unspecified shoulder: Secondary | ICD-10-CM

## 2013-04-20 DIAGNOSIS — M4802 Spinal stenosis, cervical region: Secondary | ICD-10-CM

## 2013-04-20 DIAGNOSIS — M2550 Pain in unspecified joint: Secondary | ICD-10-CM

## 2013-04-26 ENCOUNTER — Encounter: Payer: BC Managed Care – PPO | Admitting: Physical Therapy

## 2013-04-26 ENCOUNTER — Other Ambulatory Visit: Payer: Self-pay | Admitting: *Deleted

## 2013-04-26 ENCOUNTER — Encounter: Payer: BC Managed Care – PPO | Admitting: Surgery

## 2013-04-26 DIAGNOSIS — M25519 Pain in unspecified shoulder: Secondary | ICD-10-CM

## 2013-04-26 DIAGNOSIS — D3A090 Benign carcinoid tumor of the bronchus and lung: Secondary | ICD-10-CM

## 2013-04-26 DIAGNOSIS — M2550 Pain in unspecified joint: Secondary | ICD-10-CM

## 2013-04-26 DIAGNOSIS — M4802 Spinal stenosis, cervical region: Secondary | ICD-10-CM

## 2013-04-27 ENCOUNTER — Encounter: Payer: Self-pay | Admitting: Surgery

## 2013-04-27 ENCOUNTER — Ambulatory Visit
Admission: RE | Admit: 2013-04-27 | Discharge: 2013-04-27 | Disposition: A | Discharge status: Home / Self Care | Payer: BC Managed Care – PPO | Source: Ambulatory Visit | Attending: Surgery | Admitting: Surgery

## 2013-04-27 ENCOUNTER — Encounter: Payer: BC Managed Care – PPO | Admitting: Physical Therapy

## 2013-04-27 ENCOUNTER — Ambulatory Visit (INDEPENDENT_AMBULATORY_CARE_PROVIDER_SITE_OTHER): Payer: BC Managed Care – PPO | Admitting: Surgery

## 2013-04-27 VITALS — BP 134/83 | HR 74 | Resp 16 | Ht 60.0 in | Wt 230.0 lb

## 2013-04-27 DIAGNOSIS — D3A090 Benign carcinoid tumor of the bronchus and lung: Secondary | ICD-10-CM

## 2013-04-27 DIAGNOSIS — Z902 Acquired absence of lung [part of]: Secondary | ICD-10-CM

## 2013-04-27 DIAGNOSIS — Z9889 Other specified postprocedural states: Secondary | ICD-10-CM

## 2013-04-27 DIAGNOSIS — D1431 Benign neoplasm of right bronchus and lung: Secondary | ICD-10-CM

## 2013-04-27 NOTE — Progress Notes (Signed)
   [address]       [place] [address]             [phone]      HPI:  The patient returns today for followup status post right thoracotomy and right middle lobectomy on 06/13/2011 for a T1b carcinoid tumor. Since I last saw her she has been feeling well. She denies any cough or sputum production. She has had no hemoptysis. She denies any chest pain.    Current Outpatient Prescriptions  Medication Sig Dispense Refill  . amLODipine (NORVASC) 5 MG tablet Take 1 tablet (5 mg total) by mouth daily.  90 tablet  3  . clobetasol cream (TEMOVATE) 0.05 %       . meloxicam (MOBIC) 7.5 MG tablet Take 7.5 mg by mouth daily.      . metoprolol succinate (TOPROL-XL) 100 MG 24 hr tablet TAKE 1 TABLET DAILY  90 tablet  1  . nystatin-triamcinolone (MYCOLOG II) cream       . SYNTHROID 137 MCG tablet TAKE 1 TABLET (137 MCG TOTAL) BY MOUTH DAILY.  90 tablet  3  . triamterene-hydrochlorothiazide (MAXZIDE-25) 37.5-25 MG per tablet TAKE 1 TABLET EVERY DAY  90 tablet  3   No current facility-administered medications for this visit.     Physical Exam:  BP 134/83  Pulse 74  Resp 16  Ht 5' (1.524 m)  Wt 230 lb (104.327 kg)  BMI 44.92 kg/m2  SpO2 96% She looks well. There is no cervical or supraclavicular adenopathy. Lung exam is clear. Cardiac exam shows a regular rate and rhythm with normal heart sounds. The right chest thoracotomy scar is well-healed. There are no skin lesions.  Diagnostic Tests:  *RADIOLOGY REPORT*   Clinical Data: History of carcinoid tumor of the lung   CHEST - 2 VIEW   Comparison: 04/20/2012   Findings: Postsurgical changes are again noted in the right hilar area.  The cardiac shadow is stable.  The lungs are clear bilaterally.  No focal infiltrate or sizable effusion is noted.   IMPRESSION: Postsurgical changes without definitive recurrence. No change from prior exam.     Original Report Authenticated By: Alcide Clever,  M.D.   Impression:  She continues to do well almost 2 years following right middle lobectomy for carcinoid tumor.  Plan:  I will plan to see her back in 6 months with a chest x-ray.

## 2013-04-28 ENCOUNTER — Encounter: Payer: BC Managed Care – PPO | Admitting: Physical Therapy

## 2013-04-28 DIAGNOSIS — M2550 Pain in unspecified joint: Secondary | ICD-10-CM

## 2013-04-28 DIAGNOSIS — M4802 Spinal stenosis, cervical region: Secondary | ICD-10-CM

## 2013-04-28 DIAGNOSIS — M25519 Pain in unspecified shoulder: Secondary | ICD-10-CM

## 2013-05-02 ENCOUNTER — Encounter: Payer: BC Managed Care – PPO | Admitting: Physical Therapy

## 2013-05-02 DIAGNOSIS — M2550 Pain in unspecified joint: Secondary | ICD-10-CM

## 2013-05-02 DIAGNOSIS — M25519 Pain in unspecified shoulder: Secondary | ICD-10-CM

## 2013-05-02 DIAGNOSIS — M4802 Spinal stenosis, cervical region: Secondary | ICD-10-CM

## 2013-05-05 ENCOUNTER — Encounter: Payer: BC Managed Care – PPO | Admitting: Physical Therapy

## 2013-05-05 DIAGNOSIS — M4802 Spinal stenosis, cervical region: Secondary | ICD-10-CM

## 2013-05-05 DIAGNOSIS — M2550 Pain in unspecified joint: Secondary | ICD-10-CM

## 2013-05-05 DIAGNOSIS — M25519 Pain in unspecified shoulder: Secondary | ICD-10-CM

## 2013-06-21 ENCOUNTER — Encounter: Payer: Self-pay | Admitting: Obstetrics & Gynecology

## 2013-06-23 ENCOUNTER — Encounter: Payer: Self-pay | Admitting: Obstetrics & Gynecology

## 2013-06-23 ENCOUNTER — Ambulatory Visit (INDEPENDENT_AMBULATORY_CARE_PROVIDER_SITE_OTHER): Payer: 59 | Admitting: Obstetrics & Gynecology

## 2013-06-23 VITALS — BP 126/82 | HR 60 | Resp 16 | Ht 59.75 in | Wt 229.4 lb

## 2013-06-23 DIAGNOSIS — N950 Postmenopausal bleeding: Secondary | ICD-10-CM

## 2013-06-23 DIAGNOSIS — Z124 Encounter for screening for malignant neoplasm of cervix: Secondary | ICD-10-CM

## 2013-06-23 DIAGNOSIS — Z01419 Encounter for gynecological examination (general) (routine) without abnormal findings: Secondary | ICD-10-CM

## 2013-06-23 NOTE — Patient Instructions (Signed)

## 2013-06-23 NOTE — Progress Notes (Signed)
57 y.o. G3P3 MarriedCaucasianF here for annual exam.  Having some cramping and reports the she had some bright red bleeding.  She is having some low back pain as well, like she is going to start a full cycle.  No hot flashes, no night sweats.  Going to retire next year.    Patient's last menstrual period was 02/12/2011.          Sexually active: yes  The current method of family planning is none.    Exercising: yes  water, walking, and weights Smoker:  no  Health Maintenance: Pap:  05/19/12 WNL/negative HR HPV History of abnormal Pap:  Yes, endometrial cells on pap MMG:  7/15 did 3D.  Normalo Colonoscopy:  2006 repeat in 10 years BMD:   none TDaP:  2010 Screening Labs: PCP, Hb today: PCP, Urine today: PCP   reports that she has never smoked. She has never used smokeless tobacco. She reports that she does not drink alcohol or use illicit drugs.  Past Medical History  Diagnosis Date  . GERD (gastroesophageal reflux disease)   . Hypertension   . Arthritis   . Carcinoid tumor of lung 2012    right found incidentally on chest xray eval for r/o vasxculitis   . 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 HNP (herniated nucleus pulposus), cervical 01/22/2011    C6-C7  Has seen dr Venetia Maxon for this and Dr Shelle Iron  Positional numbness in hands no weakness    . Hypothyroidism   . Endometrial cells on cervical Pap smear inconsistent w/LMP 6/11    benign proliferative endometrium on endometrial biopsy  . SUI (stress urinary incontinence, female)     h/o    Past Surgical History  Procedure Laterality Date  . Cesarean section    . Shoulder open rotator cuff repair  07/20/2010    Right  . Lung tumor removed      Rt lung carcinoid  . Rotator cuff repair  5/12    Current Outpatient Prescriptions  Medication Sig Dispense Refill  . amLODipine (NORVASC) 5 MG tablet Take 1 tablet (5 mg total) by mouth daily.  90 tablet  3  . CALCIUM PO Take by mouth 2 (two) times daily.      . clobetasol cream  (TEMOVATE) 0.05 %       . metoprolol succinate (TOPROL-XL) 100 MG 24 hr tablet TAKE 1 TABLET DAILY  90 tablet  1  . Multiple Vitamins-Minerals (EYE VITAMINS PO) Take by mouth 2 (two) times daily.      . Multiple Vitamins-Minerals (MULTIVITAMIN PO) Take by mouth.      . nystatin (MYCOSTATIN/NYSTOP) 100000 UNIT/GM POWD Apply topically as needed.      . nystatin-triamcinolone (MYCOLOG II) cream       . Omega-3 Fatty Acids (FISH OIL PO) Take by mouth.      . SYNTHROID 137 MCG tablet TAKE 1 TABLET (137 MCG TOTAL) BY MOUTH DAILY.  90 tablet  3  . triamterene-hydrochlorothiazide (MAXZIDE-25) 37.5-25 MG per tablet TAKE 1 TABLET EVERY DAY  90 tablet  3  . meloxicam (MOBIC) 7.5 MG tablet Take 7.5 mg by mouth daily.       No current facility-administered medications for this visit.    Family History  Problem Relation Age of Onset  . Stroke Mother     died age 60  . Hypertension Mother   . Coronary artery disease Father     died age 35  . Hypertension Maternal Uncle   . Hypertension Maternal  Uncle   . Diabetes Brother   . Hypertension Father   . Hypertension Brother     x2  . Heart disease Mother     ROS:  Pertinent items are noted in HPI.  Otherwise, a comprehensive ROS was negative.  Exam:   BP 126/82  Pulse 60  Resp 16  Ht 4' 11.75" (1.518 m)  Wt 229 lb 6.4 oz (104.055 kg)  BMI 45.16 kg/m2  LMP 02/12/2011  Weight change: +5lb  Height: 4' 11.75" (151.8 cm)  Ht Readings from Last 3 Encounters:  06/23/13 4' 11.75" (1.518 m)  04/27/13 5' (1.524 m)  02/02/13 4' 11.75" (1.518 m)    General appearance: alert, cooperative and appears stated age Head: Normocephalic, without obvious abnormality, atraumatic Neck: no adenopathy, supple, symmetrical, trachea midline and thyroid normal to inspection and palpation Lungs: clear to auscultation bilaterally Breasts: normal appearance, no masses or tenderness Heart: regular rate and rhythm Abdomen: soft, non-tender; bowel sounds normal; no  masses,  no organomegaly Extremities: extremities normal, atraumatic, no cyanosis or edema Skin: Skin color, texture, turgor normal. No rashes or lesions Lymph nodes: Cervical, supraclavicular, and axillary nodes normal. No abnormal inguinal nodes palpated Neurologic: Grossly normal   Pelvic: External genitalia:  no lesions              Urethra:  normal appearing urethra with no masses, tenderness or lesions              Bartholins and Skenes: normal                 Vagina: normal appearing vagina with normal color and discharge, no lesions              Cervix: no lesions              Pap taken: yes Bimanual Exam:  Uterus:  normal size, contour, position, consistency, mobility, non-tender              Adnexa: normal adnexa and no mass, fullness, tenderness               Rectovaginal: Confirms               Anus:  normal sphincter tone, no lesions  A:  Well Woman with normal exam Obesity PMP bleeding Hypothyroidism H/O SUI H/O carcinoid tumor s/p lobectomy 7/12  P:   Mammogram 06/14/13 pap smear today.  Pt desires due to recent bleeding. Return for PUS and possible biopsy Labs with PCP return annually or prn  An After Visit Summary was printed and given to the patient.

## 2013-06-24 LAB — FOLLICLE STIMULATING HORMONE: FSH: 27 m[IU]/mL

## 2013-06-27 LAB — IPS PAP SMEAR ONLY

## 2013-06-29 ENCOUNTER — Ambulatory Visit (INDEPENDENT_AMBULATORY_CARE_PROVIDER_SITE_OTHER): Payer: 59

## 2013-06-29 ENCOUNTER — Other Ambulatory Visit: Payer: Self-pay | Admitting: Obstetrics & Gynecology

## 2013-06-29 ENCOUNTER — Ambulatory Visit (INDEPENDENT_AMBULATORY_CARE_PROVIDER_SITE_OTHER): Payer: 59 | Admitting: Obstetrics & Gynecology

## 2013-06-29 DIAGNOSIS — N950 Postmenopausal bleeding: Secondary | ICD-10-CM

## 2013-06-29 DIAGNOSIS — D259 Leiomyoma of uterus, unspecified: Secondary | ICD-10-CM

## 2013-06-29 DIAGNOSIS — D219 Benign neoplasm of connective and other soft tissue, unspecified: Secondary | ICD-10-CM

## 2013-06-29 DIAGNOSIS — N840 Polyp of corpus uteri: Secondary | ICD-10-CM

## 2013-06-29 NOTE — Progress Notes (Signed)
57 y.o.Marriedfemale here for a pelvic ultrasound with sonohystogram due to PMP bleeding.  She is very anxious about this today.  Indication: PMP bleeding  Patient's last menstrual period was 02/12/2011.  Contraception: PMP  Technique:  Both transabdominal and transvaginal ultrasound examinations of the pelvis were performed. Transabdominal technique was performed for global imaging of the pelvis including uterus, ovaries, adnexal regions, and pelvic cul-de-sac. It was necessary to proceed with endovaginal exam following the abdominal ultrasound transabdominal exam to visualize the endometrium and adnexa. Color and duplex Doppler ultrasound was utilized to evaluate blood flow to the ovaries.    FINDINGS: UTERUS: 6.5 x 4.8 x 3.9cm, 2 fibroids 2.2 x 2.1 cm and 2.3 x 1.7cm, uterus and fibroids smaller than last u/s several years ago EMS: 5.78mm ADNEXA: right ovary 2.1 x 1.2 x 1.6cm, left ovary 2.1 x 1.7 x 1.7xm, atrophic CUL DE SAC: no free fluid  SHSG:  After obtaining appropriate verbal consent from patient, the cervix was visualized using a speculum, and prepped with betadine.  A tenaculum  was applied to the cervix.  Dilation of the cervix was necessary. The catheter was passed into the uterus and sterile saline introduced, with the following findings:  21mm defect noted most consistent with endometrial polyp.  Procedure discussed with patient.  Recovery and pain management discussed.  Risks discussed including but not limited to bleeding, rare risk of transfusion, infection, 1% risk of uterine perforation with risks of fluid deficit causing cardiac arrythmia, cerebral swelling and/or need to stop procedure early.  Fluid emboli and rare risk of death discussed.  DVT/PE, rare risk of risk of bowel/bladder/ureteral/vascular injury.  Patient aware if pathology abnormal she may need additional treatment.  All questions answered.  She would like to proceed and schedule.  No biopsy planned as will sent  pathology during procedure.  Assessment:  PMP bleeding, endometrial polyp, uterine fibroids  Plan:  Hysteroscopy, polyp resection, D&C planned.  All questions answered.  ~30 minutes spent with patient >50% of time was in face to face discussion of above.

## 2013-06-30 ENCOUNTER — Encounter: Payer: Self-pay | Admitting: Obstetrics & Gynecology

## 2013-06-30 NOTE — Patient Instructions (Signed)
We will call with surgery information.

## 2013-07-05 ENCOUNTER — Telehealth: Payer: Self-pay | Admitting: Obstetrics & Gynecology

## 2013-07-05 NOTE — Telephone Encounter (Signed)
Call to patient and discuss date preferences and recovery time off work. Will schedule surgery and call her back.

## 2013-07-05 NOTE — Telephone Encounter (Signed)
Patient calling in to find out when her surgery is to be schedule ? She needs to rearrange her schedule.

## 2013-07-07 ENCOUNTER — Encounter (HOSPITAL_COMMUNITY): Payer: Self-pay | Admitting: Pharmacist

## 2013-07-11 ENCOUNTER — Telehealth: Payer: Self-pay | Admitting: *Deleted

## 2013-07-11 ENCOUNTER — Encounter (HOSPITAL_COMMUNITY)
Admission: RE | Admit: 2013-07-11 | Discharge: 2013-07-11 | Disposition: A | Discharge status: Home / Self Care | Payer: 59 | Source: Ambulatory Visit | Attending: Obstetrics & Gynecology | Admitting: Obstetrics & Gynecology

## 2013-07-11 ENCOUNTER — Encounter (HOSPITAL_COMMUNITY): Payer: Self-pay

## 2013-07-11 DIAGNOSIS — Z01812 Encounter for preprocedural laboratory examination: Secondary | ICD-10-CM | POA: Insufficient documentation

## 2013-07-11 DIAGNOSIS — Z01818 Encounter for other preprocedural examination: Secondary | ICD-10-CM | POA: Insufficient documentation

## 2013-07-11 DIAGNOSIS — Z01810 Encounter for preprocedural cardiovascular examination: Secondary | ICD-10-CM | POA: Insufficient documentation

## 2013-07-11 HISTORY — DX: Cardiac arrhythmia, unspecified: I49.9

## 2013-07-11 LAB — BASIC METABOLIC PANEL
BUN: 23 mg/dL (ref 6–23)
Calcium: 9.7 mg/dL (ref 8.4–10.5)
GFR calc Af Amer: >90 mL/min (ref >90)
GFR calc non Af Amer: >90 mL/min (ref >90)
Glucose, Bld: 99 mg/dL (ref 70–99)
Sodium: 136 mEq/L (ref 135–145)

## 2013-07-11 LAB — CBC
Hemoglobin: 15.7 g/dL — ABNORMAL HIGH (ref 12.0–15.0)
MCH: 30.2 pg (ref 26.0–34.0)
MCHC: 34.5 g/dL (ref 30.0–36.0)
RDW: 12.9 % (ref 11.5–15.5)

## 2013-07-11 NOTE — Telephone Encounter (Signed)
Call to patient to review surgery instructions.  Had preop with hospital earlier today.  Post op appointment scheduled.  Hysteroscopy/Polyp resect/D&C scheduled for 07-18-13 at 1100 at [facility].

## 2013-07-11 NOTE — Patient Instructions (Addendum)
Nichole Smith  07/11/2013   Your procedure is scheduled on:  07/18/2013  Enter through the Main Entrance of [facility] at 0915 AM.  Pick up the phone at the desk and dial [phone].   Call this number if you have problems the morning of surgery: [phone]   Remember:   Do not eat food:After Midnight.  Take these medicines the morning of surgery with A SIP OF WATER: Maxzide, Norvasc, Toprol, Synthroid   Do not wear jewelry, make-up or nail polish.  Do not wear lotions, powders, or perfumes. You may wear deodorant.  Do not bring valuables to the hospital.  [facility] is not responsible                  for any belongings or valuables brought to the hospital.  Contacts, dentures or bridgework may not be worn into surgery.  Leave suitcase in the car. After surgery it may be brought to your room.  For patients admitted to the hospital, checkout time is 11:00 AM the day of                discharge.   Patients discharged the day of surgery will not be allowed to drive                   home.  Name and phone number of your driver:Dale  Special Instructions: Shower using CHG 2 nights before surgery and the night before surgery.  If you shower the day of surgery use CHG.  Use special wash - you have one bottle of CHG for all showers.  You should use approximately 1/3 of the bottle for each shower.   Please read over the following fact sheets that you were given: Surgical Site Infection Prevention

## 2013-07-12 NOTE — Telephone Encounter (Signed)
Surgery 07-18-13 at 1100 at [facility], return call to patient on 07-11-13 and reviewed instructions and patient form mailed. Post op scheduled, questions answered. Call PRN.

## 2013-07-15 ENCOUNTER — Telehealth: Payer: Self-pay | Admitting: *Deleted

## 2013-07-15 ENCOUNTER — Telehealth: Payer: Self-pay | Admitting: Obstetrics & Gynecology

## 2013-07-15 NOTE — Telephone Encounter (Signed)
See next call.

## 2013-07-15 NOTE — Telephone Encounter (Signed)
Patient received her preop instruction sheet regarding no fish oil and Aleve for 2 weeks before surgery.  She just stopped these products on 07-11-13 (because that is when I talked to her).  Since only 6 full days without Aleve, can she proceed?

## 2013-07-15 NOTE — Telephone Encounter (Signed)
Dr Hyacinth Meeker, patient is aware.

## 2013-07-15 NOTE — Telephone Encounter (Signed)
LM on cell number.  Surgery time changes to 0930 and needs to arrive at 8 am.  Cell number VM has name confirmation.

## 2013-07-15 NOTE — Telephone Encounter (Signed)
Yes. Not a problem.

## 2013-07-15 NOTE — Telephone Encounter (Signed)
Patient called to let Kennon Rounds know the surgery time change is fine. She will be there at 8:00.

## 2013-07-15 NOTE — Telephone Encounter (Signed)
Patient notified

## 2013-07-15 NOTE — Telephone Encounter (Signed)
Patient has up coming surgery on Monday, she stopped her aleve on the 11th . The instruction called for her to stop any NSAIDs 2 weeks prior. Is this going to be a problem for her surgery?

## 2013-07-17 NOTE — H&P (Signed)
Nichole Smith is an 57 y.o. female G2P3 (twins) here for hysteroscopic polyp removal with D&C.  She presented earlier this month with PMP bleeding.  Office sonohysterography was performed showing thin endometrium but an endometrial polyp was present.  Removal, risks and benefits were discussed that day.  No biopsy was performed as I felt this was most likely benign and even with negative pathology, I would still recommend polyp removed.  Patient has decided to proceed.    Pertinent Gynecological History: Menses: post-menopausal Bleeding: post menopausal bleeding Contraception: status post hysterectomy DES exposure: denies Blood transfusions: none Sexually transmitted diseases: no past history Previous GYN Procedures: none  Last mammogram: normal Date: 2013 Last pap: normal Date: 06/23/13 OB History: G2, P3   Menstrual History: Patient's last menstrual period was 02/12/2011.    Past Medical History  Diagnosis Date  . Hypertension   . Arthritis   . Carcinoid tumor of lung 2012    right found incidentally on chest xray eval for r/o vasxculitis   . 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 HNP (herniated nucleus pulposus), cervical 01/22/2011    C6-C7  Has seen dr Venetia Maxon for this and Dr Shelle Iron  Positional numbness in hands no weakness    . Hypothyroidism   . Endometrial cells on cervical Pap smear inconsistent w/LMP 6/11    benign proliferative endometrium on endometrial biopsy  . SUI (stress urinary incontinence, female)     h/o  . Dysrhythmia     irregular occassional    Past Surgical History  Procedure Laterality Date  . Cesarean section    . Shoulder open rotator cuff repair  07/20/2010    Right  . Lung tumor removed      Rt lung carcinoid  . Rotator cuff repair  5/12    Family History  Problem Relation Age of Onset  . Stroke Mother     died age 60  . Hypertension Mother   . Coronary artery disease Father     died age 16  . Hypertension Maternal Uncle   .  Hypertension Maternal Uncle   . Diabetes Brother   . Hypertension Father   . Hypertension Brother     x2  . Heart disease Mother     Social History:  reports that she has never smoked. She has never used smokeless tobacco. She reports that she does not drink alcohol or use illicit drugs.  Allergies:  Allergies  Allergen Reactions  . Neomycin     REACTION: Inflammation  . Tramadol Other (See Comments)    dizzness  . Ace Inhibitors Cough    No prescriptions prior to admission    Review of Systems  Unable to perform ROS   Last menstrual period 02/12/2011. Physical Exam  Constitutional: She is oriented to person, place, and time. She appears well-developed and well-nourished.  HENT:  Head: Normocephalic and atraumatic.  Neck: Normal range of motion. Neck supple.  Cardiovascular: Normal rate and regular rhythm.   Respiratory: Effort normal and breath sounds normal.  GI: Soft. Bowel sounds are normal.  Musculoskeletal: Normal range of motion.  Neurological: She is alert and oriented to person, place, and time.  Skin: Skin is warm and dry.  Psychiatric: She has a normal mood and affect.    No results found for this or any previous visit (from the past 24 hour(s)).  No results found.  Assessment/Plan: 57 yo MWF with PMP bleeding and endometrial polyp on evaluation here for hysteroscopic removal, D&C.  All questions answered.  Patient ready to proceed.  Valentina Shaggy [facility] 07/17/2013, 8:47 PM

## 2013-07-18 ENCOUNTER — Ambulatory Visit (HOSPITAL_COMMUNITY): Payer: 59 | Admitting: Anesthesiology

## 2013-07-18 ENCOUNTER — Encounter (HOSPITAL_COMMUNITY)
Admission: RE | Disposition: A | Discharge status: Home / Self Care | Payer: Self-pay | Source: Ambulatory Visit | Attending: Obstetrics & Gynecology

## 2013-07-18 ENCOUNTER — Encounter (HOSPITAL_COMMUNITY): Payer: Self-pay | Admitting: Anesthesiology

## 2013-07-18 ENCOUNTER — Ambulatory Visit (HOSPITAL_COMMUNITY)
Admission: RE | Admit: 2013-07-18 | Discharge: 2013-07-18 | Disposition: A | Discharge status: Home / Self Care | Payer: 59 | Source: Ambulatory Visit | Attending: Obstetrics & Gynecology | Admitting: Obstetrics & Gynecology

## 2013-07-18 ENCOUNTER — Ambulatory Visit: Admit: 2013-07-18 | Admitting: Obstetrics & Gynecology

## 2013-07-18 DIAGNOSIS — N950 Postmenopausal bleeding: Secondary | ICD-10-CM

## 2013-07-18 DIAGNOSIS — Z90710 Acquired absence of both cervix and uterus: Secondary | ICD-10-CM | POA: Insufficient documentation

## 2013-07-18 DIAGNOSIS — N840 Polyp of corpus uteri: Secondary | ICD-10-CM | POA: Insufficient documentation

## 2013-07-18 DIAGNOSIS — D250 Submucous leiomyoma of uterus: Secondary | ICD-10-CM | POA: Insufficient documentation

## 2013-07-18 DIAGNOSIS — D259 Leiomyoma of uterus, unspecified: Secondary | ICD-10-CM

## 2013-07-18 HISTORY — PX: DILATATION & CURRETTAGE/HYSTEROSCOPY WITH RESECTOCOPE: SHX5572

## 2013-07-18 SURGERY — DILATATION & CURETTAGE/HYSTEROSCOPY WITH RESECTOCOPE
Anesthesia: General | Site: Vagina | Wound class: Clean Contaminated

## 2013-07-18 SURGERY — DILATATION & CURETTAGE/HYSTEROSCOPY WITH RESECTOCOPE
Anesthesia: Choice

## 2013-07-18 MED ORDER — GLYCINE 1.5 % IR SOLN
Status: DC | PRN
  Administered 2013-07-18: 3000 mL

## 2013-07-18 MED ORDER — ONDANSETRON HCL 4 MG/2ML IJ SOLN: 4.0000 mg | Freq: Once | INTRAMUSCULAR | Status: DC | PRN

## 2013-07-18 MED ORDER — FENTANYL CITRATE 0.05 MG/ML IJ SOLN
INTRAMUSCULAR | Status: AC
  Filled 2013-07-18: qty 2

## 2013-07-18 MED ORDER — DEXTROSE 5 % IV SOLN
2.0000 g | INTRAVENOUS | Status: AC
  Administered 2013-07-18: 2 g via INTRAVENOUS
  Filled 2013-07-18: qty 2

## 2013-07-18 MED ORDER — LIDOCAINE-EPINEPHRINE 1 %-1:100000 IJ SOLN
INTRAMUSCULAR | Status: DC | PRN
  Administered 2013-07-18: 10 mL

## 2013-07-18 MED ORDER — LIDOCAINE HCL (CARDIAC) 20 MG/ML IV SOLN
INTRAVENOUS | Status: AC
  Filled 2013-07-18: qty 5

## 2013-07-18 MED ORDER — KETOROLAC TROMETHAMINE 30 MG/ML IJ SOLN
INTRAMUSCULAR | Status: DC | PRN
  Administered 2013-07-18: 30 mg via INTRAVENOUS

## 2013-07-18 MED ORDER — PROPOFOL 10 MG/ML IV BOLUS
INTRAVENOUS | Status: DC | PRN
  Administered 2013-07-18: 200 mg via INTRAVENOUS

## 2013-07-18 MED ORDER — KETOROLAC TROMETHAMINE 30 MG/ML IJ SOLN: 15.0000 mg | Freq: Once | INTRAMUSCULAR | Status: DC | PRN

## 2013-07-18 MED ORDER — KETOROLAC TROMETHAMINE 30 MG/ML IJ SOLN
INTRAMUSCULAR | Status: AC
  Filled 2013-07-18: qty 1

## 2013-07-18 MED ORDER — LACTATED RINGERS IV SOLN
INTRAVENOUS | Status: DC
  Administered 2013-07-18: 08:00:00 via INTRAVENOUS

## 2013-07-18 MED ORDER — HYDROMORPHONE HCL PF 1 MG/ML IJ SOLN
INTRAMUSCULAR | Status: AC
  Filled 2013-07-18: qty 1

## 2013-07-18 MED ORDER — DEXAMETHASONE SODIUM PHOSPHATE 4 MG/ML IJ SOLN
INTRAMUSCULAR | Status: DC | PRN
  Administered 2013-07-18: 10 mg via INTRAVENOUS

## 2013-07-18 MED ORDER — DEXAMETHASONE SODIUM PHOSPHATE 10 MG/ML IJ SOLN
INTRAMUSCULAR | Status: AC
  Filled 2013-07-18: qty 1

## 2013-07-18 MED ORDER — ONDANSETRON HCL 4 MG/2ML IJ SOLN
INTRAMUSCULAR | Status: DC | PRN
  Administered 2013-07-18: 4 mg via INTRAVENOUS

## 2013-07-18 MED ORDER — MIDAZOLAM HCL 2 MG/2ML IJ SOLN
INTRAMUSCULAR | Status: AC
  Filled 2013-07-18: qty 2

## 2013-07-18 MED ORDER — FENTANYL CITRATE 0.05 MG/ML IJ SOLN
25.0000 ug | INTRAMUSCULAR | Status: DC | PRN
  Administered 2013-07-18: 25 ug via INTRAVENOUS

## 2013-07-18 MED ORDER — MIDAZOLAM HCL 5 MG/5ML IJ SOLN
INTRAMUSCULAR | Status: DC | PRN
  Administered 2013-07-18: 2 mg via INTRAVENOUS

## 2013-07-18 MED ORDER — ONDANSETRON HCL 4 MG/2ML IJ SOLN
INTRAMUSCULAR | Status: AC
  Filled 2013-07-18: qty 2

## 2013-07-18 MED ORDER — HYDROCODONE-ACETAMINOPHEN 5-325 MG PO TABS: 2.0000 | ORAL_TABLET | Freq: Four times a day (QID) | ORAL | Status: DC | PRN

## 2013-07-18 MED ORDER — MEPERIDINE HCL 25 MG/ML IJ SOLN: 6.2500 mg | INTRAMUSCULAR | Status: DC | PRN

## 2013-07-18 MED ORDER — INDIGOTINDISULFONATE SODIUM 8 MG/ML IJ SOLN
INTRAMUSCULAR | Status: AC
  Filled 2013-07-18: qty 5

## 2013-07-18 MED ORDER — FENTANYL CITRATE 0.05 MG/ML IJ SOLN
INTRAMUSCULAR | Status: DC | PRN
Start: 2013-07-18 — End: 2013-07-18
  Administered 2013-07-18: 100 ug via INTRAVENOUS

## 2013-07-18 MED ORDER — LIDOCAINE HCL (CARDIAC) 20 MG/ML IV SOLN
INTRAVENOUS | Status: DC | PRN
  Administered 2013-07-18 (×2): 50 mg via INTRAVENOUS

## 2013-07-18 MED ORDER — PROPOFOL 10 MG/ML IV EMUL
INTRAVENOUS | Status: AC
  Filled 2013-07-18: qty 20

## 2013-07-18 SURGICAL SUPPLY — 17 items
CANISTER SUCTION 2500CC (MISCELLANEOUS) ×2 IMPLANT
CATH ROBINSON RED A/P 16FR (CATHETERS) ×2 IMPLANT
CLOTH BEACON ORANGE TIMEOUT ST (SAFETY) ×2 IMPLANT
CONTAINER PREFILL 10% NBF 60ML (FORM) ×4 IMPLANT
DILATOR CANAL MILEX (MISCELLANEOUS) IMPLANT
DRESSING TELFA 8X3 (GAUZE/BANDAGES/DRESSINGS) ×2 IMPLANT
ELECT REM PT RETURN 9FT ADLT (ELECTROSURGICAL)
ELECTRODE REM PT RTRN 9FT ADLT (ELECTROSURGICAL) IMPLANT
GLOVE BIOGEL PI IND STRL 7.0 (GLOVE) ×1 IMPLANT
GLOVE BIOGEL PI INDICATOR 7.0 (GLOVE) ×1
GLOVE ECLIPSE 6.5 STRL STRAW (GLOVE) ×4 IMPLANT
GOWN STRL REIN XL XLG (GOWN DISPOSABLE) ×4 IMPLANT
LOOP ANGLED CUTTING 22FR (CUTTING LOOP) IMPLANT
PACK HYSTEROSCOPY LF (CUSTOM PROCEDURE TRAY) ×2 IMPLANT
PAD OB MATERNITY 4.3X12.25 (PERSONAL CARE ITEMS) ×2 IMPLANT
TOWEL OR 17X24 6PK STRL BLUE (TOWEL DISPOSABLE) ×4 IMPLANT
WATER STERILE IRR 1000ML POUR (IV SOLUTION) ×2 IMPLANT

## 2013-07-18 NOTE — Transfer of Care (Signed)
Immediate Anesthesia Transfer of Care Note  Patient: Nichole Smith  Procedure(s) Performed: Procedure(s) with comments: DILATATION & CURETTAGE/HYSTEROSCOPY WITH RESECTOCOPE (N/A) - mass resection  Patient Location: PACU  Anesthesia Type:General  Level of Consciousness: awake, alert  and oriented  Airway & Oxygen Therapy: Patient Spontanous Breathing and Patient connected to nasal cannula oxygen  Post-op Assessment: Report given to PACU RN and Post -op Vital signs reviewed and stable  Post vital signs: Reviewed and stable  Complications: No apparent anesthesia complications

## 2013-07-18 NOTE — Anesthesia Preprocedure Evaluation (Signed)
Anesthesia Evaluation  Patient identified by MRN, date of birth, ID band Patient awake    Reviewed: Allergy & Precautions, H&P , NPO status , Patient's Chart, lab work & pertinent test results, reviewed documented beta blocker date and time   Airway Mallampati: II TM Distance: >3 FB Neck ROM: full    Dental no notable dental hx. (+) Teeth Intact   Pulmonary neg pulmonary ROS,    Pulmonary exam normal       Cardiovascular hypertension, Pt. on medications and Pt. on home beta blockers     Neuro/Psych negative neurological ROS  negative psych ROS   GI/Hepatic Neg liver ROS,   Endo/Other  Hypothyroidism Morbid obesity  Renal/GU negative Renal ROS  negative genitourinary   Musculoskeletal negative musculoskeletal ROS (+)   Abdominal (+) + obese,   Peds  Hematology negative hematology ROS (+)   Anesthesia Other Findings   Reproductive/Obstetrics negative OB ROS                           Anesthesia Physical Anesthesia Plan  ASA: III  Anesthesia Plan: General   Post-op Pain Management:    Induction: Intravenous  Airway Management Planned: LMA  Additional Equipment:   Intra-op Plan:   Post-operative Plan:   Informed Consent: I have reviewed the patients History and Physical, chart, labs and discussed the procedure including the risks, benefits and alternatives for the proposed anesthesia with the patient or authorized representative who has indicated his/her understanding and acceptance.     Plan Discussed with: CRNA and Surgeon  Anesthesia Plan Comments:         Anesthesia Quick Evaluation

## 2013-07-18 NOTE — Anesthesia Postprocedure Evaluation (Signed)
Anesthesia Post Note  Patient: Nichole Smith  Procedure(s) Performed: Procedure(s) (LRB): DILATATION & CURETTAGE/HYSTEROSCOPY WITH RESECTOCOPE (N/A)  Anesthesia type: General  Patient location: PACU  Post pain: Pain level controlled  Post assessment: Post-op Vital signs reviewed  Last Vitals:  Filed Vitals:   07/18/13 1200  BP:   Pulse: 76  Temp: 36.5 C  Resp: 10    Post vital signs: Reviewed  Level of consciousness: sedated  Complications: No apparent anesthesia complications

## 2013-07-18 NOTE — Op Note (Signed)
07/18/2013  11:17 AM  PATIENT:  Nichole Smith  57 y.o. 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  She is placed in the supine position. SCDs were on her lower extremities and functioning properly. General anesthesia with an LMA was administered without difficulty. Dr. Georgetta Haber oversaw case.  Legs were then placed in the [facility] stirrups in the low lithotomy position. The legs were lifted to the high lithotomy position and the Betadine prep was used on the inner thighs perineum and vagina x3. Patient was draped in a normal standard fashion. An in and out catheterization with a red rubber Foley catheter was performed. Approximately 50 cc of clear urine was noted. A bivalve speculum was placed the vagina. The anterior lip of the cervix was grasped with single-tooth tenaculum.  A paracervical block of 1% lidocaine mixed one-to-one with epinephrine (1:100,000 units).  10 cc was used total. The cervix is dilated up to #21 [facility] dilators. The endometrial cavity sounded to 9 cm.   A 2.9 millimeter diagnostic hysteroscope was obtained. 1.5% glycine was used as a hysteroscopic fluid. The hysteroscope was advanced through the endocervical canal into the endometrial cavity. The tubal ostia were noted bilaterally.  A large  endometrial polyp with fundal attachment was noted as well as a submucosal fibroid.  On ultrasound in the office, this fibroid appeared to be intramural and pushing up on the endometrial cavity but with hysteroscopy is was largely in the cavity.  The hysteroscope was removed and changed to the resectoscope.  The cervix was dilated up to a #31.  Using a single loop, the polyp was resected at the base and then gasped with a polyp forcep and removed.  The hysteroscope was replaced and reaching out with the loop and bringing it back towards the hysteroscope, the fibroid was resected in multiple passes.  Once there was just a small amount of fibroid left, which at this point was obviously completely submucosal, the polyp forcep was used to grasp and remove the final portion of the probably myoma.  The hysteroscope was replaced and the bed of the endometrial cavity was cauterized at any location where bleeding was seen.  The pressure was decreased and then was done again.  At this point, there was minimal bleeding.  The actual counted fluid deficit was 470cc.  This included fluid in my surgical gown and fluid on the floor so actual deficit would be lower then 470cc.  At this point the procedure was ended. The hysteroscope was removed. The tenaculum was removed from the anterior lip of the cervix.  Scant bleeding from the cervix was noted.  The speculum was removed from the vagina. The prep was cleansed of the patient's skin. The legs are positioned back in the supine position. Sponge, lap, needle, initially counts were correct x2. Patient was taken to recovery in stable condition.  COUNTS:  YES  PLAN OF CARE: Transfer to PACU

## 2013-07-19 ENCOUNTER — Encounter (HOSPITAL_COMMUNITY): Payer: Self-pay | Admitting: Obstetrics & Gynecology

## 2013-07-21 NOTE — Telephone Encounter (Signed)
Patient notified per dr Rondel Baton instructions that pathology reports shows no abnormal cells.  Patient states very minimal bleeding and feels fine, excited about beach trip. Will use tampon only for a few hours as she discussed with Dr Hyacinth Meeker. Encouraged to call prn. Has post op scheduled.

## 2013-07-21 NOTE — Telephone Encounter (Signed)
Pt is returning a call to Tresa Endo was told to ask for Kennon Rounds if she was unavailable

## 2013-07-28 ENCOUNTER — Ambulatory Visit (INDEPENDENT_AMBULATORY_CARE_PROVIDER_SITE_OTHER): Payer: 59 | Admitting: Obstetrics & Gynecology

## 2013-07-28 ENCOUNTER — Encounter: Payer: Self-pay | Admitting: Obstetrics & Gynecology

## 2013-07-28 ENCOUNTER — Telehealth: Payer: Self-pay | Admitting: Obstetrics & Gynecology

## 2013-07-28 VITALS — BP 114/72 | HR 64 | Temp 97.9°F | Resp 16 | Ht 60.0 in | Wt 230.0 lb

## 2013-07-28 DIAGNOSIS — R3915 Urgency of urination: Secondary | ICD-10-CM

## 2013-07-28 DIAGNOSIS — N390 Urinary tract infection, site not specified: Secondary | ICD-10-CM

## 2013-07-28 LAB — URINALYSIS
Bilirubin Urine: NEGATIVE
Ketones, ur: NEGATIVE mg/dL
Protein, ur: NEGATIVE mg/dL
Urobilinogen, UA: 0.2 mg/dL (ref 0.0–1.0)

## 2013-07-28 LAB — POCT URINALYSIS DIPSTICK: Urobilinogen, UA: NEGATIVE

## 2013-07-28 MED ORDER — CIPROFLOXACIN HCL 500 MG PO TABS: 500.0000 mg | ORAL_TABLET | Freq: Two times a day (BID) | ORAL | Status: DC

## 2013-07-28 NOTE — Telephone Encounter (Signed)
Spoke with pt who had D&C, hysteroscopy on 07-18-13 with SM. Pt has been doing well, is back at work, and bleeding has almost stopped. "Only just a little pink when I wipe." Over the past 2 days pt has noticed burning with urination, some urgency, and feeling like her urethra is irritated. No fever or pelvic pain. Pt would like to be seen today, but SM schedule is full. Scheduled OV with TL at 2:15 per pt request.

## 2013-07-28 NOTE — Telephone Encounter (Signed)
LMTCB  aa 

## 2013-07-28 NOTE — Progress Notes (Signed)
Post Operative Visit  Procedure:hysteroscopic polyp and fibroid resection Days Post-op: 10 days  Subjective: 10 days post op from hysteroscopic polyp and fibroid resection.  She reports minimal bleeding but is having some "achiness" in left left mid section, upper left flank, and left groin.  This has been going on about a week.  As well, she's having urgency and frequency.  She says this does not feel like a UTI that she's had before because there is no dysuria.  Denies fever.  No vaginal odor.  No other issues, just doesn't feel quite normal.  Objective: BP 114/72  Pulse 64  Temp(Src) 97.9 F (36.6 C)  Resp 16  Ht 5' (1.524 m)  Wt 230 lb (104.327 kg)  BMI 44.92 kg/m2  LMP 02/12/2011  EXAM General: alert and cooperative Flank:  No CVA tenderness Extremities: extremities normal, atraumatic, no cyanosis or edema Vaginal Bleeding: minimal Gyn:  NAEFG, cervix closed, no CMT, uterus slightly enlarged c/w fibroids (known), and no adnexal tenderness  Assessment: s/p hysteroscopic fibroid and polyp resection, possible nephrolithiasis  Plan: 10 days U/A and culture pending Ciprofloxin 500mg  BID x 7 D

## 2013-07-28 NOTE — Patient Instructions (Signed)
- 

## 2013-07-28 NOTE — Telephone Encounter (Signed)
Patient C/O UTI symptoms , burning , feeling the need to go to the bathroom . Pressure when sitting . No fevers .  More so in the last two days.

## 2013-07-30 LAB — URINE CULTURE: Colony Count: NO GROWTH

## 2013-08-03 ENCOUNTER — Ambulatory Visit: Payer: Self-pay | Admitting: Obstetrics & Gynecology

## 2013-08-08 ENCOUNTER — Ambulatory Visit (INDEPENDENT_AMBULATORY_CARE_PROVIDER_SITE_OTHER): Payer: 59 | Admitting: Obstetrics & Gynecology

## 2013-08-08 ENCOUNTER — Encounter: Payer: Self-pay | Admitting: Family Medicine

## 2013-08-08 ENCOUNTER — Encounter: Payer: Self-pay | Admitting: Obstetrics & Gynecology

## 2013-08-08 ENCOUNTER — Ambulatory Visit (INDEPENDENT_AMBULATORY_CARE_PROVIDER_SITE_OTHER): Payer: 59 | Admitting: Family Medicine

## 2013-08-08 VITALS — BP 128/78 | HR 60 | Resp 12 | Ht 60.75 in | Wt 231.2 lb

## 2013-08-08 VITALS — BP 124/80 | HR 69 | Ht 60.0 in | Wt 230.0 lb

## 2013-08-08 DIAGNOSIS — M7661 Achilles tendinitis, right leg: Secondary | ICD-10-CM

## 2013-08-08 DIAGNOSIS — M7660 Achilles tendinitis, unspecified leg: Secondary | ICD-10-CM

## 2013-08-08 DIAGNOSIS — D250 Submucous leiomyoma of uterus: Secondary | ICD-10-CM

## 2013-08-08 MED ORDER — NITROGLYCERIN 0.2 MG/HR TD PT24: MEDICATED_PATCH | TRANSDERMAL | Status: DC

## 2013-08-08 NOTE — Assessment & Plan Note (Signed)
Patient does have a severe case of Achilles tendinitis. Patient was given the option of going into a Cam Walker boot and being coming nonweightbearing which she declined. Compression dressing done today. Ice protocol Discussed he centric exercises and strengthening I will start in 48 hours. Naproxen 2 times daily Nitroglycerin given and warned of potential side effects. Patient come back again in 2 weeks for further evaluation.

## 2013-08-08 NOTE — Progress Notes (Signed)
CC: Right ankle pain  HPI: Patient is a very pleasant 57 year old female coming in with acute onset of right ankle pain. Patient has had plantar fasciitis in the past and states that this is somewhat similar but seems to be worsening. Patient was walking more often than usual and has also been doing more yard work. Patient states starting Monday of last week she started having some discomfort of the posterior part of her heels it seems to be worsening. Patient states it is worse with activity and better with rest. Patient has been taking naproxen with mild improvement. Patient describes the pain as a severe throbbing aching sensation with a tearing sensation with certain movements. Patient denies any radiation of pain he denies any numbness. Patient states there may be some mild swelling. Patient was the severity of 8/10.  Past medical, surgical, family and social history reviewed. Medications reviewed all in the electronic medical record.   Review of Systems: No headache, visual changes, nausea, vomiting, diarrhea, constipation, dizziness, abdominal pain, skin rash, fevers, chills, night sweats, weight loss, swollen lymph nodes,chest pain, shortness of breath, mood changes.   Objective:    Blood pressure 124/80, pulse 69, height 5' (1.524 m), weight 230 lb (104.327 kg), last menstrual period 02/12/2011, SpO2 97.00%.   General: No apparent distress alert and oriented x3 mood and affect normal, dressed appropriately. Obese HEENT: Pupils equal, extraocular movements intact Respiratory: Patient's speak in full sentences and does not appear short of breath Cardiovascular: No lower extremity edema, non tender, no erythema Skin: Warm dry intact with no signs of infection or rash on extremities or on axial skeleton. Abdomen: Soft nontender, obese Neuro: Cranial nerves II through XII are intact, neurovascularly intact in all extremities with 2+ DTRs and 2+ pulses. Lymph: No lymphadenopathy of posterior  or anterior cervical chain or axillae bilaterally.  Gait antalgic secondary to pain keeping foot externally rotated and not pushing off. MSK: Non tender with full range of motion and good stability and symmetric strength and tone of shoulders, elbows, wrist, hip, knee and bilaterally.  Ankle: No visible erythema or swelling. Range of motion is full in all directions passively but does have pain with extreme dorsi and plantarflexion on the posterior heel the Strength is 5/5 in all directions except resisted dorsi flexion is severely painful to Stable lateral and medial ligaments; squeeze test and kleiger test unremarkable; Talar dome nontender; No pain at base of 5th MT; No tenderness over cuboid; No tenderness over N spot or navicular prominence No tenderness on posterior aspects of lateral and medial malleolus No sign of peroneal tendon subluxations or tenderness to palpation Negative tarsal tunnel tinel's Able to walk 4 steps. Achilles tendon is extremely tender to palpation at the insertion on the calcaneus. There is mild swelling in this area. No erythema noted. Neurovascularly intact distally. The contralateral exam unremarkable  MSK US performed of: Right ankle This study was ordered, performed, and interpreted by Terrilee Files D.O.  Foot/Ankle:  Right All structures visualized.   Talar dome unremarkable  Ankle mortise without effusion. Peroneus longus and brevis tendons unremarkable on long and transverse views without sheath effusions. Posterior tibialis, flexor hallucis longus, and flexor digitorum longus tendons unremarkable on long and transverse views without sheath effusions. Achilles tendon visualized with approximately 20% tear of the tendon near its insertion. Patient does have calcific changes surrounding the area and hypoechoic changes. Patient does have significant neovascularization in the area as well. Anterior Talofibular Ligament and Calcaneofibular Ligaments  unremarkable and  intact. Deltoid Ligament unremarkable and intact. Plantar fascia intact and without effusion, normal thickness. No increased doppler signal, cap sign, or thickening of tibial cortex.  IMPRESSION:  Severe Achilles tendinitis with partial tear of approximately 20% of tendon     Impression and Recommendations:     This case required medical decision making of moderate complexity.

## 2013-08-08 NOTE — Progress Notes (Signed)
Subjective: Doing much better.  Pain is all gone.  She did take the antibiotics despite the urine culture being negative.  She says this happened similarly about 10 years ago.  She is spotting some, lightly and bright red daily.  Had stopped for several day.  No bladder issues now.  No fever.  No back pain.  Objective: BP 128/78  Pulse 60  Resp 12  Ht 5' 0.75" (1.543 m)  Wt 231 lb 3.2 oz (104.872 kg)  BMI 44.05 kg/m2  LMP 02/12/2011  EXAM General: alert and cooperative Gyn:  NAEFG, Vagina without lesions, cervix close, scant bleeding, no CMT Extremities: extremities normal, atraumatic, no cyanosis or edema Vaginal Bleeding: minimal  Assessment: S/P fibroid resection UTI symptoms, negative culture, resolved with antibiotics  Plan: Recheck AEX 1 year Pt to call if still stopping in 2 weeks.  Advised with fibroid resection, this is common and not to be alarmed.

## 2013-08-08 NOTE — Patient Instructions (Addendum)
Very nice to meet you You have a very bad case of achilles tendonitis.  I cannot do prednisone due to the tear. Continue the naproxen daily for next 10 days then as needed.  I want you to do ice baths 20 minutes 2-3 times daily.   Achilles Rehab  Begin with easy walking, heel, toe and backwards across room 10 times  Calf raises on a step First lower and then raise on 1 foot If this is painful lower on 1 foot but do the heel raise on both feet  Begin with 3 sets of 10 repetitions  Increase by 5 repetitions every 3 days  Goal is 3 sets of 30 repetitions  Do with both knee straight and knee at 20 degrees of flexion  Also do handout exercises.   Nitroglycerin Protocol   Apply 1/4 nitroglycerin patch to affected area daily.  Change position of patch within the affected area every 24 hours.  You may experience a headache during the first 1-2 weeks of using the patch, these should subside.  If you experience headaches after beginning nitroglycerin patch treatment, you may take your preferred over the counter pain reliever.  Another side effect of the nitroglycerin patch is skin irritation or rash related to patch adhesive.  Please notify our office if you develop more severe headaches or rash, and stop the patch.  Tendon healing with nitroglycerin patch may require 12 to 24 weeks depending on the extent of injury.  Men should not use if taking Viagra, Cialis, or Levitra.   Do not use if you have migraines or rosacea.   Come back in 3 weeks.

## 2013-08-09 ENCOUNTER — Encounter: Payer: Self-pay | Admitting: Family Medicine

## 2013-08-17 ENCOUNTER — Ambulatory Visit: Payer: BC Managed Care – PPO | Admitting: Internal Medicine

## 2013-08-22 ENCOUNTER — Ambulatory Visit (INDEPENDENT_AMBULATORY_CARE_PROVIDER_SITE_OTHER): Payer: 59 | Admitting: Family Medicine

## 2013-08-22 ENCOUNTER — Encounter: Payer: Self-pay | Admitting: Family Medicine

## 2013-08-22 VITALS — BP 124/82 | HR 73 | Wt 231.0 lb

## 2013-08-22 DIAGNOSIS — M7661 Achilles tendinitis, right leg: Secondary | ICD-10-CM

## 2013-08-22 DIAGNOSIS — M7660 Achilles tendinitis, unspecified leg: Secondary | ICD-10-CM

## 2013-08-22 NOTE — Patient Instructions (Addendum)
Continue the nitro patch.  Icing 20 minutes after exercises Really focus on new stretches in chair or on bed for piriformis Continue the calf raises on stair with toes in the different areas Start with 10 minute walks 3-4 times a week and increase weekly by 10 minutes.  Come back again in 4 weeks.

## 2013-08-22 NOTE — Progress Notes (Signed)
Subjective:    CC: Followup for Achilles tendinitis right  HPI: Nichole Smith is a very pleasant 57 year old female following up 2 weeks after being diagnosed with a small partial Achilles tendinitis as well as a calcaneal bursitis. Nichole Smith was given nitroglycerin, home exercise program, and anti-inflammatories as well as icing protocol. Nichole Smith states she is approximately 70-80% better. Nichole Smith is wearing tennis shoes at all times as well as a heel lift which has been beneficial. Nichole Smith has been wearing a nitroglycerin patch without any significant side effects. Nichole Smith states that the swelling has gotten better. Nichole Smith has been doing the exercises on a near daily basis. Nichole Smith still gets a twinge from time to time but overall is very happy with the results. Nichole Smith denies any new symptoms such as radiation of pain or any numbness or tingling in the toes.  Past medical history, Surgical history, Family history not pertinant except as noted below, Social history, Allergies, and medications have been entered into the medical record, reviewed, and no changes needed.   Review of Systems: No fevers, chills, night sweats, weight loss, chest pain, or shortness of breath.   Objective:   Blood pressure 124/82, pulse 73, weight 231 lb (104.781 kg), last menstrual period 02/12/2011, SpO2 95.00%.  General: Well Developed, well nourished, and in no acute distress.  Neuro: Alert and oriented x3, extra-ocular muscles intact, sensation grossly intact.  HEENT: Normocephalic, atraumatic, pupils equal round reactive to light, neck supple, no masses, no lymphadenopathy, thyroid nonpalpable.  Skin: Warm and dry, no rashes. Cardiac:  no lower extremity edema. Respiratory: Not using accessory muscles, speaking in full sentences. Abdominal: NT, soft Gait: Nonantlagic, good balance and coordination Lymphatic: no lymphadenopathy in neck or axillae on palpation, non tender.  Musculoskeletal: Inspection and palpation of  the right and left upper extremities including the shoulders elbows and wrist are unremarkable with full range of motion and good muscle strength and tone. Inspection and palpation of the right and left lower extremities including the hips knees  are unremarkable and nontender with full range of motion and good muscle strength and tone and are symmetric. Ankle:  No visible erythema or swelling.  Range of motion is full in all directions passively but does have pain with extreme dorsi and plantarflexion on the posterior heel the  Strength is 5/5 in all directions except resisted dorsi flexion is severely painful to  Stable lateral and medial ligaments; squeeze test and kleiger test unremarkable;  Talar dome nontender;  No pain at base of 5th MT; No tenderness over cuboid;  No tenderness over N spot or navicular prominence  No tenderness on posterior aspects of lateral and medial malleolus  No sign of peroneal tendon subluxations or tenderness to palpation  Negative tarsal tunnel tinel's  Able to walk 4 steps.  Achilles tendon is slightly tender to palpation at the insertion on the calcaneus.no swelling or erythema noted. Neurovascularly intact distally.  The contralateral exam unremarkable   MSK US performed of: Right ankle  This study was ordered, performed, and interpreted by Terrilee Files D.O.  Foot/Ankle: Right  All structures visualized.  Talar dome unremarkable  Ankle mortise without effusion.  Peroneus longus and brevis tendons unremarkable on long and transverse views without sheath effusions.  Posterior tibialis, flexor hallucis longus, and flexor digitorum longus tendons unremarkable on long and transverse views without sheath effusions.  Achilles tendon visualized with with previous tear significantly healed. Nichole Smith does has calcium deposits in this area and significant neovascularization occurring the tear appears to  be approximately 80% healed at this time. Anterior Talofibular  Ligament and Calcaneofibular Ligaments unremarkable and intact.  Deltoid Ligament unremarkable and intact.  Plantar fascia intact and without effusion, normal thickness.  No increased doppler signal, cap sign, or thickening of tibial cortex.  IMPRESSION: Severe Achilles tendinitis improving with tear approximately 80% better.   Impression and Recommendations:

## 2013-08-22 NOTE — Assessment & Plan Note (Signed)
Patient is doing very well at this time. Patient was given another handout of now strength exercises that will help the ankle. We will also focus on the lateral column to allow for proper support. Discuss starting walking regimen Discussed continued icing and exercises previously given. Continue nitroglycerin, anti-inflammatories as needed Patient will then follow up in 4 weeks for further evaluation.

## 2013-09-13 ENCOUNTER — Other Ambulatory Visit: Payer: Self-pay | Admitting: Dermatology

## 2013-09-21 ENCOUNTER — Ambulatory Visit: Payer: 59 | Admitting: Family Medicine

## 2013-10-04 ENCOUNTER — Encounter: Payer: Self-pay | Admitting: Family Medicine

## 2013-10-04 ENCOUNTER — Other Ambulatory Visit (INDEPENDENT_AMBULATORY_CARE_PROVIDER_SITE_OTHER): Payer: 59

## 2013-10-04 ENCOUNTER — Ambulatory Visit (INDEPENDENT_AMBULATORY_CARE_PROVIDER_SITE_OTHER): Payer: 59 | Admitting: Family Medicine

## 2013-10-04 VITALS — BP 140/86 | HR 73

## 2013-10-04 DIAGNOSIS — M7751 Other enthesopathy of right foot: Secondary | ICD-10-CM

## 2013-10-04 DIAGNOSIS — M7660 Achilles tendinitis, unspecified leg: Secondary | ICD-10-CM

## 2013-10-04 DIAGNOSIS — M7661 Achilles tendinitis, right leg: Secondary | ICD-10-CM

## 2013-10-04 DIAGNOSIS — M7750 Other enthesopathy of unspecified foot: Secondary | ICD-10-CM | POA: Insufficient documentation

## 2013-10-04 DIAGNOSIS — M7671 Peroneal tendinitis, right leg: Secondary | ICD-10-CM | POA: Insufficient documentation

## 2013-10-04 NOTE — Assessment & Plan Note (Signed)
Seems to be improving the patient to have a retrocalcaneal bursitis. We talked about different treatment options and patient still limited declined any type of intervention such as an injection. We discussed proper shoe wear. Encourage patient to do exercises on a daily basis for at least 3 times a week Discussed icing protocol Patient was given a heel lift which was applied by me today. Patient will try to get new shoes and come back after 2 weeks. If she continues to have pain she would be a candidate for custom orthotics.

## 2013-10-04 NOTE — Patient Instructions (Addendum)
Achilles tendon seems to be near healed.  Exercises 3 times a week New shoes, I want a neutral shoe with full tread on bottom. Wide forefoot.  Try them for about 2 weeks, if not extremely better make an appointment with me for orthotics.  Continue the nitro patch again and I would do daily.  Do the heel raises at least on a book.  Ice baths 20 minutes after activity.  We will see you again if needed.

## 2013-10-04 NOTE — Assessment & Plan Note (Signed)
Seen again on ultrasound today. Patient continues to have trouble or inflammation in that area I would consider doing a steroid injection. Patient though would rather try the conservative approach will continue with home exercise program. We may also consider formal physical therapy if it does not seem to improve.

## 2013-10-04 NOTE — Assessment & Plan Note (Signed)
Seen on ultrasound today. Patient given other exercises to work on the lateral column of the ankle which will be beneficial. Patient also had recommendations of shoe wear that could make a potential difference. Patient will come back again in 2-3 weeks. If she continues to have trouble we will consider doing custom orthotics.

## 2013-10-04 NOTE — Progress Notes (Signed)
Subjective:    CC: Followup for Achilles tendinitis right  HPI: Patient is a very pleasant 57 year old female following up 6 weeks after being diagnosed with a small partial Achilles tendinitis as well as a calcaneal bursitis. Patient was told to continue a nitroglycerin patch, home exercises and anti-inflammatories as needed. Patient states no side effects from the nitroglycerin she states she is not doing this daily. Patient has also stopped doing the exercises daily to she was getting better. Patient states unfortunately over the course last 2 weeks she is starting to have worsening pain again. Most of the right posterior as well as lateral of the ankle. Patient denies any radiation to the toes, any numbness or tingling.  Past medical history, Surgical history, Family history not pertinant except as noted below, Social history, Allergies, and medications have been entered into the medical record, reviewed, and no changes needed.   Review of Systems: No fevers, chills, night sweats, weight loss, chest pain, or shortness of breath.   Objective:   Blood pressure 140/86, pulse 73, last menstrual period 02/12/2011, SpO2 98.00%.  General: Well Developed, well nourished, and in no acute distress.  Neuro: Alert and oriented x3, extra-ocular muscles intact, sensation grossly intact.  HEENT: Normocephalic, atraumatic, pupils equal round reactive to light, neck supple, no masses, no lymphadenopathy, thyroid nonpalpable.  Skin: Warm and dry, no rashes. Cardiac:  no lower extremity edema. Respiratory: Not using accessory muscles, speaking in full sentences. Abdominal: NT, soft Gait: Nonantlagic, good balance and coordination Lymphatic: no lymphadenopathy in neck or axillae on palpation, non tender.  Musculoskeletal: Inspection and palpation of the right and left upper extremities including the shoulders elbows and wrist are unremarkable with full range of motion and good muscle strength and  tone. Inspection and palpation of the right and left lower extremities including the hips knees  are unremarkable and nontender with full range of motion and good muscle strength and tone and are symmetric. Ankle: Right No visible erythema or swelling.  Range of motion is full in all directions passively but does have pain with extreme dorsi and plantarflexion on the posterior heel the  Strength is 5/5 in all directions except resisted dorsi flexion is severely painful to  Stable lateral and medial ligaments; squeeze test and kleiger test unremarkable;  Talar dome nontender;  No pain at base of 5th MT; No tenderness over cuboid;  No tenderness over N spot or navicular prominence  No tenderness on posterior aspects of lateral and medial malleolus  No sign of peroneal tendon subluxations but tenderness to palpation  Negative tarsal tunnel tinel's  Able to walk 4 steps.  Achilles tendon is still slightly tender to palpation at the insertion on the calcaneus.no swelling or erythema noted. Neurovascularly intact distally.  The contralateral exam unremarkable   MSK US performed of: Right ankle  This study was ordered, performed, and interpreted by Terrilee Files D.O.  Foot/Ankle: Right  All structures visualized.  Talar dome unremarkable  Ankle mortise without effusion.  Peroneus longus and brevis tendons does have hypoechoic changes that is consistent with sheath effusions. No true tear appreciated.  Posterior tibialis, flexor hallucis longus, and flexor digitorum longus tendons unremarkable on long and transverse views without sheath effusions.  Achilles tendon visualized with with previous tear healed with scar tissue patient still has a retrocalcaneal bursa that is inflamed but significantly less calcium deposits. Anterior Talofibular Ligament and Calcaneofibular Ligaments unremarkable and intact.  Deltoid Ligament unremarkable and intact.  Plantar fascia intact and without  effusion, normal  thickness.  No increased doppler signal, cap sign, or thickening of tibial cortex.  IMPRESSION: Improving Achilles tendinosis with continued retrocalcaneal bursitis and now new-onset of peroneal tendinitis.    Impression and Recommendations:

## 2013-10-05 ENCOUNTER — Other Ambulatory Visit: Payer: Self-pay | Admitting: Internal Medicine

## 2013-10-06 ENCOUNTER — Other Ambulatory Visit: Payer: Self-pay

## 2013-10-17 ENCOUNTER — Other Ambulatory Visit: Payer: Self-pay | Admitting: *Deleted

## 2013-10-17 DIAGNOSIS — D3A00 Benign carcinoid tumor of unspecified site: Secondary | ICD-10-CM

## 2013-10-19 ENCOUNTER — Ambulatory Visit
Admission: RE | Admit: 2013-10-19 | Discharge: 2013-10-19 | Disposition: A | Discharge status: Home / Self Care | Source: Ambulatory Visit | Attending: Surgery | Admitting: Surgery

## 2013-10-19 ENCOUNTER — Encounter: Payer: Self-pay | Admitting: Surgery

## 2013-10-19 ENCOUNTER — Ambulatory Visit (INDEPENDENT_AMBULATORY_CARE_PROVIDER_SITE_OTHER): Payer: 59 | Admitting: Surgery

## 2013-10-19 VITALS — BP 156/82 | HR 68 | Resp 16 | Ht 60.0 in | Wt 228.0 lb

## 2013-10-19 DIAGNOSIS — D3A00 Benign carcinoid tumor of unspecified site: Secondary | ICD-10-CM

## 2013-10-19 DIAGNOSIS — Z9889 Other specified postprocedural states: Secondary | ICD-10-CM

## 2013-10-19 DIAGNOSIS — C342 Malignant neoplasm of middle lobe, bronchus or lung: Secondary | ICD-10-CM

## 2013-10-19 DIAGNOSIS — Z902 Acquired absence of lung [part of]: Secondary | ICD-10-CM

## 2013-10-20 ENCOUNTER — Encounter: Payer: Self-pay | Admitting: Surgery

## 2013-10-20 NOTE — Progress Notes (Signed)
     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 Since I last saw her she has been feeling well. She denies any cough or sputum production. She has had no hemoptysis. She denies any chest pain.   Current Outpatient Prescriptions  Medication Sig Dispense Refill  . amLODipine (NORVASC) 5 MG tablet Take 1 tablet (5 mg total) by mouth daily.  90 tablet  3  . CALCIUM PO Take by mouth 2 (two) times daily.      . clobetasol cream (TEMOVATE) 0.05 %       . metoprolol succinate (TOPROL-XL) 100 MG 24 hr tablet TAKE 1 TABLET DAILY  90 tablet  0  . Multiple Vitamins-Minerals (EYE VITAMINS PO) Take 1 capsule by mouth 2 (two) times daily. "Eye caps"      . Multiple Vitamins-Minerals (MULTIVITAMIN PO) Take 1 tablet by mouth daily.       . naproxen sodium (ANAPROX) 220 MG tablet Take 220-440 mg by mouth 2 (two) times daily as needed (back and and shoulder pain).      . nitroGLYCERIN (NITRODUR - DOSED IN MG/24 HR) 0.2 mg/hr patch 1/4 patch daily  30 patch  1  . Omega-3 Fatty Acids (FISH OIL PO) Take 1 capsule by mouth daily.       [name] SYNTHROID 137 MCG tablet TAKE 1 TABLET (137 MCG TOTAL) BY MOUTH DAILY.  90 tablet  3  . triamterene-hydrochlorothiazide (MAXZIDE-25) 37.5-25 MG per tablet TAKE 1 TABLET EVERY DAY  90 tablet  3   No current facility-administered medications for this visit.     Physical Exam: BP 156/82  Pulse 68  Resp 16  Ht 5' (1.524 m)  Wt 228 lb (103.42 kg)  BMI 44.53 kg/m2  SpO2 98%  LMP 02/12/2011  She looks well.  There is no cervical or supraclavicular adenopathy.  Lung exam is clear.  Cardiac exam shows a regular rate and rhythm with normal heart sounds.  The right chest thoracotomy scar is well-healed. There are no skin lesions.    Diagnostic Tests:  CLINICAL DATA: History of lung surgery in 2012, no current  complaint, history of carcinoid tumor  EXAM:  CHEST 2 VIEW    COMPARISON: 04/27/2013  FINDINGS:  The heart size and vascular pattern are normal. There are surgical  clips in the right hilum, which are stable. The lungs are clear.  There are no pleural effusions.  IMPRESSION:  No active cardiopulmonary disease.  Electronically Signed  By: Esperanza Heir M.D.  On: 10/19/2013 10:36   Impression:  She continues to do well following right middle lobectomy for carcinoid tumor.   Plan:   I will plan to see her back in 6 months with a chest x-ray.

## 2013-12-21 NOTE — Telephone Encounter (Signed)
Routed to provider for signature, encounter closed.

## 2014-01-07 ENCOUNTER — Other Ambulatory Visit: Payer: Self-pay | Admitting: Internal Medicine

## 2014-01-26 ENCOUNTER — Encounter: Payer: Self-pay | Admitting: Internal Medicine

## 2014-01-30 ENCOUNTER — Ambulatory Visit (INDEPENDENT_AMBULATORY_CARE_PROVIDER_SITE_OTHER): Admitting: Family Medicine

## 2014-01-30 DIAGNOSIS — Z23 Encounter for immunization: Secondary | ICD-10-CM

## 2014-02-15 ENCOUNTER — Other Ambulatory Visit (INDEPENDENT_AMBULATORY_CARE_PROVIDER_SITE_OTHER)

## 2014-02-15 DIAGNOSIS — Z0000 Encounter for general adult medical examination without abnormal findings: Secondary | ICD-10-CM

## 2014-02-15 LAB — CBC WITH DIFFERENTIAL/PLATELET
Basophils Absolute: 0 10*3/uL (ref 0.0–0.1)
Basophils Relative: 0.5 % (ref 0.0–3.0)
Eosinophils Absolute: 0.2 10*3/uL (ref 0.0–0.7)
Eosinophils Relative: 2.4 % (ref 0.0–5.0)
HCT: 47.9 % — ABNORMAL HIGH (ref 36.0–46.0)
Hemoglobin: 16.1 g/dL — ABNORMAL HIGH (ref 12.0–15.0)
Lymphocytes Relative: 37.1 % (ref 12.0–46.0)
Lymphs Abs: 2.9 10*3/uL (ref 0.7–4.0)
MCHC: 33.6 g/dL (ref 30.0–36.0)
MCV: 90.3 fl (ref 78.0–100.0)
MONO ABS: 0.6 10*3/uL (ref 0.1–1.0)
Monocytes Relative: 7.6 % (ref 3.0–12.0)
NEUTROS PCT: 52.4 % (ref 43.0–77.0)
Neutro Abs: 4.1 10*3/uL (ref 1.4–7.7)
PLATELETS: 271 10*3/uL (ref 150.0–400.0)
RBC: 5.31 Mil/uL — ABNORMAL HIGH (ref 3.87–5.11)
RDW: 13.1 % (ref 11.5–14.6)
WBC: 7.7 10*3/uL (ref 4.5–10.5)

## 2014-02-15 LAB — BASIC METABOLIC PANEL
BUN: 21 mg/dL (ref 6–23)
CO2: 30 meq/L (ref 19–32)
Calcium: 9.1 mg/dL (ref 8.4–10.5)
Chloride: 99 mEq/L (ref 96–112)
Creatinine, Ser: 0.8 mg/dL (ref 0.4–1.2)
GFR: 83.93 mL/min (ref >60.00)
Glucose, Bld: 100 mg/dL — ABNORMAL HIGH (ref 70–99)
Potassium: 3.9 mEq/L (ref 3.5–5.1)
Sodium: 137 mEq/L (ref 135–145)

## 2014-02-15 LAB — HEPATIC FUNCTION PANEL
ALK PHOS: 62 U/L (ref 39–117)
ALT: 24 U/L (ref 0–35)
AST: 21 U/L (ref 0–37)
Albumin: 3.8 g/dL (ref 3.5–5.2)
Bilirubin, Direct: 0 mg/dL (ref 0.0–0.3)
Total Bilirubin: 0.7 mg/dL (ref 0.3–1.2)
Total Protein: 7.5 g/dL (ref 6.0–8.3)

## 2014-02-15 LAB — LIPID PANEL
CHOLESTEROL: 153 mg/dL (ref 0–200)
HDL: 48.5 mg/dL (ref >39.00)
LDL Cholesterol: 90 mg/dL (ref 0–99)
Total CHOL/HDL Ratio: 3
Triglycerides: 73 mg/dL (ref 0.0–149.0)
VLDL: 14.6 mg/dL (ref 0.0–40.0)

## 2014-02-15 LAB — TSH: TSH: 1.79 u[IU]/mL (ref 0.35–5.50)

## 2014-02-22 ENCOUNTER — Encounter: Payer: Self-pay | Admitting: Internal Medicine

## 2014-02-22 ENCOUNTER — Ambulatory Visit (INDEPENDENT_AMBULATORY_CARE_PROVIDER_SITE_OTHER): Admitting: Internal Medicine

## 2014-02-22 VITALS — BP 146/90 | HR 72 | Temp 98.1°F | Ht 59.5 in | Wt 237.0 lb

## 2014-02-22 DIAGNOSIS — E669 Obesity, unspecified: Secondary | ICD-10-CM

## 2014-02-22 DIAGNOSIS — R635 Abnormal weight gain: Secondary | ICD-10-CM

## 2014-02-22 DIAGNOSIS — E039 Hypothyroidism, unspecified: Secondary | ICD-10-CM

## 2014-02-22 DIAGNOSIS — Z0000 Encounter for general adult medical examination without abnormal findings: Secondary | ICD-10-CM

## 2014-02-22 DIAGNOSIS — I10 Essential (primary) hypertension: Secondary | ICD-10-CM

## 2014-02-22 DIAGNOSIS — D582 Other hemoglobinopathies: Secondary | ICD-10-CM

## 2014-02-22 DIAGNOSIS — M2550 Pain in unspecified joint: Secondary | ICD-10-CM

## 2014-02-22 MED ORDER — TRIAMTERENE-HCTZ 37.5-25 MG PO TABS: ORAL_TABLET | ORAL | Status: DC

## 2014-02-22 MED ORDER — METOPROLOL SUCCINATE ER 100 MG PO TB24: 100.0000 mg | ORAL_TABLET | Freq: Every day | ORAL | Status: DC

## 2014-02-22 MED ORDER — AMLODIPINE BESYLATE 5 MG PO TABS: 5.0000 mg | ORAL_TABLET | Freq: Every day | ORAL | Status: DC

## 2014-02-22 NOTE — Patient Instructions (Addendum)
Intensify lifestyle interventions. Wt Readings from Last 3 Encounters:  02/22/14 237 lb (107.502 kg)  10/19/13 228 lb (103.42 kg)  08/22/13 231 lb (104.781 kg)   Will plan hematology consult on the elevated  hemoglobin being persistent.  Weight loss will help you medical conditions . Track sleep as well as  Other conditions  return office visit in about 3-4 months or as ndded

## 2014-02-22 NOTE — Progress Notes (Signed)
Pre visit review using our clinic review tool, if applicable. No additional management support is needed unless otherwise documented below in the visit note.   Chief Complaint  Patient presents with  . Annual Exam  . 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  Negative tobacco significant alcohol.  Achilles area somewhat better exercise no she needs to lose weight.  No history of sleep apnea. Health Maintenance  Topic Date Due  . Influenza Vaccine  07/01/2014  . Mammogram  06/17/2015  . Pap Smear  06/23/2016  . Colonoscopy  08/23/2017  . Tetanus/tdap  01/31/2024   Health Maintenance Review   ROS:  GEN/ HEENT: No fever, significant weight changes sweats headaches vision problems hearing changes, CV/ PULM; No chest pain shortness of breath cough, syncope,edema  change in exercise tolerance. GI /GU: No adominal pain, vomiting, change in bowel habits. No blood in the stool. No significant GU symptoms. SKIN/HEME: ,no acute skin rashes suspicious lesions or bleeding. No lymphadenopathy, nodules, masses.  NEURO/ PSYCH:  No neurologic signs such as weakness numbness. No depression anxiety. IMM/ Allergy: No unusual infections.  Allergy .   REST of 12 system review negative except as per HPI   Past Medical History  Diagnosis Date  . Hypertension   . Arthritis   . Carcinoid tumor of lung 2012    right found incidentally on chest xray eval for r/o vasxculitis   . 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  HNP (herniated nucleus pulposus), cervical 01/22/2011    C6-C7  Has seen dr Vertell Limber for this and Dr Tonita Cong  Positional numbness in hands no weakness    . Hypothyroidism   . Endometrial cells on cervical Pap smear inconsistent w/LMP 6/11    benign proliferative endometrium on endometrial biopsy  . SUI (stress urinary incontinence, female)     h/o  . Dysrhythmia     irregular occassional    Family History  Problem Relation Age of Onset  . Stroke Mother     died age 30  . Hypertension Mother   . Coronary artery disease Father     died age 2  . Hypertension Maternal Uncle   . Hypertension Maternal Uncle   . Diabetes Brother   . Hypertension Father   . Hypertension Brother     x2  . Heart disease Mother     History   Social History  . Marital Status: Married    Spouse Name: N/A    Number of Children: N/A  . Years of Education: N/A   Social History Main Topics  . Smoking status: Never Smoker   . Smokeless tobacco: Never Used  . Alcohol Use: No  . Drug Use: No  . Sexual Activity: Yes    Partners: Male    Birth Control/ Protection: None   Other Topics Concern  . None   Social History Narrative   hhof 2    2 Children at college and  beyond      [employer] full time   Married    Retired age 29 2015    Outpatient Encounter Prescriptions as of 02/22/2014  Medication Sig  . amLODipine (NORVASC) 5 MG tablet Take 1 tablet (5 mg total) by mouth daily.  [name] CALCIUM PO Take by mouth 2 (two) times daily.  . clobetasol cream (TEMOVATE) 0.05 %   . methocarbamol (ROBAXIN) 750 MG tablet   . metoprolol succinate (TOPROL-XL) 100 MG 24 hr tablet Take 1 tablet (100 mg total) by mouth daily. Take with or immediately following a meal.  . Multiple Vitamins-Minerals (EYE VITAMINS PO) Take 1 capsule by mouth 2 (two) times daily. "Eye caps"  . Multiple Vitamins-Minerals (MULTIVITAMIN PO) Take 1 tablet by mouth daily.   . naproxen sodium (ANAPROX) 220 MG tablet Take 220-440 mg by mouth 2  (two) times daily as needed (back and and shoulder pain).  . nitroGLYCERIN (NITRODUR - DOSED IN MG/24 HR) 0.2 mg/hr patch 1/4 patch daily  . Omega-3 Fatty Acids (FISH OIL PO) Take 1 capsule by mouth daily.   [name] SYNTHROID 137 MCG tablet TAKE 1 TABLET (137 MCG TOTAL) BY MOUTH DAILY.  [name] triamterene-hydrochlorothiazide (MAXZIDE-25) 37.5-25 MG per tablet TAKE 1 TABLET EVERY DAY  . [DISCONTINUED] amLODipine (NORVASC) 5 MG tablet Take 1 tablet (5 mg total) by mouth daily.  . [DISCONTINUED] amLODipine (NORVASC) 5 MG tablet Take 1 tablet (5 mg total) by mouth daily.  . [DISCONTINUED] metoprolol succinate (TOPROL-XL) 100 MG 24 hr tablet TAKE 1 TABLET BY MOUTH EVERY DAY  . [DISCONTINUED] triamterene-hydrochlorothiazide (MAXZIDE-25) 37.5-25 MG per tablet TAKE 1 TABLET EVERY DAY    EXAM:  BP 146/90  Pulse 72  Temp(Src) 98.1 F (36.7 C) (Oral)  Ht 4' 11.5" (1.511 m)  Wt 237 lb (107.502 kg)  BMI 47.09 kg/m2  SpO2 97%  LMP 02/12/2011  Body mass index is 47.09 kg/(m^2).  Physical Exam: Vital signs reviewed EQA:STMH is a well-developed well-nourished alert cooperative    who appearsr stated age in no acute distress.  HEENT: normocephalic atraumatic , Eyes: PERRL EOM's full, conjunctiva clear, Nares: paten,t no deformity discharge or tenderness., Ears: no deformity EAC's clear TMs with normal landmarks. Mouth: clear OP, no lesions, edema.  Moist mucous membranes. Dentition in adequate repair. NECK: supple without masses, thyromegaly or bruits. CHEST/PULM:  Clear to auscultation and percussion breath sounds equal no wheeze , rales or rhonchi. No chest wall deformities or tenderness. Breast: early bruising right superior  Tender ness no masses  . No dimpling, discharge, masses, or discharge . CV: PMI is nondisplaced, S1 S2 no gallops, murmurs, rubs. Peripheral pulses are full without delay.No JVD .  ABDOMEN: Bowel sounds normal nontender  No guard or rebound, no hepato splenomegal no CVA tenderness.  No  hernia. Extremtities:  No clubbing cyanosis or edema, no acute joint swelling or redness no focal atrophy NEURO:  Oriented x3, cranial nerves 3-12 appear to be intact, no obvious focal weakness,gait within normal limits no abnormal reflexes or asymmetrical SKIN: No acute rashes normal turgor, color, no bruising or petechiae. PSYCH: Oriented, good eye contact, no obvious depression anxiety, cognition and judgment appear normal. LN: no cervical axillary inguinal adenopathy  Lab Results  Component Value Date   WBC 7.7 02/15/2014   HGB 16.1* 02/15/2014   HCT 47.9* 02/15/2014   PLT 271.0 02/15/2014   GLUCOSE 100* 02/15/2014   CHOL 153 02/15/2014   TRIG 73.0 02/15/2014   HDL 48.50 02/15/2014   LDLCALC 90  02/15/2014   ALT 24 02/15/2014   AST 21 02/15/2014   NA 137 02/15/2014   K 3.9 02/15/2014   CL 99 02/15/2014   CREATININE 0.8 02/15/2014   BUN 21 02/15/2014   CO2 30 02/15/2014   TSH 1.79 02/15/2014   INR 0.96 06/11/2011    ASSESSMENT AND PLAN:  Discussed the following assessment and plan:  Visit for preventive health examination  Unspecified essential hypertension  Unspecified hypothyroidism  Elevated hemoglobin - persistent and now up to 16 ; get heme opinions  - Plan: Ambulatory referral to Hematology  Obesity, unspecified  Weight gain - life style pt motivated to lose weight plan fu   Pain in joint, multiple sites  Patient Care Team: Burnis Medin, MD as PCP - General Jari Pigg, MD (Dermatology) Lyman Speller, MD as Attending Physician (Gynecology) Lafayette Dragon, MD (Gastroenterology) Gaye Pollack, MD (Cardiothoracic Surgery) Yvette Rack., MD as Attending Physician (Orthopedic Surgery) Patient Instructions   Intensify lifestyle interventions. Wt Readings from Last 3 Encounters:  02/22/14 237 lb (107.502 kg)  10/19/13 228 lb (103.42 kg)  08/22/13 231 lb (104.781 kg)   Will plan hematology consult on the elevated  hemoglobin being persistent.  Weight loss will  help you medical conditions . Track sleep as well as  Other conditions  return office visit in about 3-4 months or as ndded     Wanda K. Panosh M.D.

## 2014-02-23 ENCOUNTER — Telehealth: Payer: Self-pay | Admitting: Internal Medicine

## 2014-02-23 NOTE — Telephone Encounter (Signed)
Relevant patient education assigned to patient using Emmi. ° °

## 2014-02-24 ENCOUNTER — Telehealth: Payer: Self-pay | Admitting: Hematology and Oncology

## 2014-02-24 NOTE — Telephone Encounter (Signed)
LEFT MESSAGE FOR PATIENT TO RETURN CALL TO SCHEDULE NP APPT.  °

## 2014-02-27 ENCOUNTER — Telehealth: Payer: Self-pay | Admitting: Hematology and Oncology

## 2014-02-27 NOTE — Telephone Encounter (Signed)
S/W PATIENT AND GAVE NEW PATIENT APPT FOR 04/13 @ 1:30 W/DR. [place].  REFERRING DR. Mariann Laster PANOSH DX- ELEVATED HGB WELCOME PACKET MAILED.

## 2014-02-27 NOTE — Telephone Encounter (Signed)
C/D 02/27/14 for appt. 03/13/14

## 2014-03-13 ENCOUNTER — Telehealth: Payer: Self-pay | Admitting: Hematology and Oncology

## 2014-03-13 ENCOUNTER — Encounter: Payer: Self-pay | Admitting: Hematology and Oncology

## 2014-03-13 ENCOUNTER — Ambulatory Visit (HOSPITAL_BASED_OUTPATIENT_CLINIC_OR_DEPARTMENT_OTHER)

## 2014-03-13 ENCOUNTER — Ambulatory Visit

## 2014-03-13 ENCOUNTER — Ambulatory Visit (HOSPITAL_BASED_OUTPATIENT_CLINIC_OR_DEPARTMENT_OTHER): Admitting: Hematology and Oncology

## 2014-03-13 VITALS — BP 136/72 | HR 79 | Temp 97.6°F | Resp 18 | Ht 59.0 in | Wt 240.0 lb

## 2014-03-13 DIAGNOSIS — I10 Essential (primary) hypertension: Secondary | ICD-10-CM

## 2014-03-13 DIAGNOSIS — D751 Secondary polycythemia: Secondary | ICD-10-CM

## 2014-03-13 DIAGNOSIS — R61 Generalized hyperhidrosis: Secondary | ICD-10-CM

## 2014-03-13 DIAGNOSIS — L298 Other pruritus: Secondary | ICD-10-CM

## 2014-03-13 LAB — CBC WITH DIFFERENTIAL/PLATELET
BASO%: 0.3 % (ref 0.0–2.0)
BASOS ABS: 0 10*3/uL (ref 0.0–0.1)
EOS ABS: 0.2 10*3/uL (ref 0.0–0.5)
EOS%: 2.1 % (ref 0.0–7.0)
HCT: 45.3 % (ref 34.8–46.6)
HEMOGLOBIN: 15.4 g/dL (ref 11.6–15.9)
LYMPH%: 32.1 % (ref 14.0–49.7)
MCH: 30.3 pg (ref 25.1–34.0)
MCHC: 34 g/dL (ref 31.5–36.0)
MCV: 89 fL (ref 79.5–101.0)
MONO#: 0.7 10*3/uL (ref 0.1–0.9)
MONO%: 7.1 % (ref 0.0–14.0)
NEUT#: 5.9 10*3/uL (ref 1.5–6.5)
NEUT%: 58.4 % (ref 38.4–76.8)
PLATELETS: 245 10*3/uL (ref 145–400)
RBC: 5.09 10*6/uL (ref 3.70–5.45)
RDW: 13.2 % (ref 11.2–14.5)
WBC: 10 10*3/uL (ref 3.9–10.3)
lymph#: 3.2 10*3/uL (ref 0.9–3.3)

## 2014-03-13 NOTE — Progress Notes (Signed)
Checked in new pt with no financial concerns. °

## 2014-03-13 NOTE — Progress Notes (Signed)
[place] NOTE  Patient Care Team: Burnis Medin, MD as PCP - General Jari Pigg, MD (Dermatology) Lyman Speller, MD as Attending Physician (Gynecology) Lafayette Dragon, MD (Gastroenterology) Gaye Pollack, MD (Cardiothoracic Surgery) Yvette Rack., MD as Attending Physician (Orthopedic Surgery)  CHIEF COMPLAINTS/PURPOSE OF CONSULTATION:  Chronic erythrocytosis  HISTORY OF PRESENTING ILLNESS:  Nichole Smith 58 y.o. female is here because of chronic erythrocytosis. I had the opportunity to review her blood work in [employer] records. The patient have chronic elevated hemoglobin since 2008 to present. The number ranges from 15-16.1. Most the of the blood work was done in the setting of routine primary care visit.She denies prior diagnosis of blood clot. Denies smoking history. She was never diagnosed with obstructive sleep apnea and was never told she snores excessively. She had history of lung surgery in the past and was diagnosed with carcinoid tumor, discovered incidentally.  She had chronic hypertension. Denies any headaches or leg cramps. She has chronic skin itching but was diagnosed with eczema. She have intermittent night sweats but she attributed that to menopausal symptoms.   MEDICAL HISTORY:  Past Medical History  Diagnosis Date  . Hypertension   . Arthritis   . Carcinoid tumor of lung 2012    right found incidentally on chest xray eval for r/o vasxculitis   . 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 HNP (herniated nucleus pulposus), cervical 01/22/2011    C6-C7  Has seen dr Vertell Limber for this and Dr Tonita Cong  Positional numbness in hands no weakness    . Hypothyroidism   . Endometrial cells on cervical Pap smear inconsistent w/LMP 6/11    benign proliferative endometrium on endometrial biopsy  . SUI (stress urinary incontinence, female)     h/o  . Dysrhythmia     irregular occassional    SURGICAL HISTORY: Past Surgical History   Procedure Laterality Date  . Cesarean section    . Shoulder open rotator cuff repair  07/20/2010    Right  . Lung tumor removed      Rt lung carcinoid  . Rotator cuff repair  5/12  . Dilatation & currettage/hysteroscopy with resectocope N/A 07/18/2013    Procedure: DILATATION & CURETTAGE/HYSTEROSCOPY WITH RESECTOCOPE;  Surgeon: Lyman Speller, MD;  Location: [facility];  Service: Gynecology;  Laterality: N/A;  mass resection    SOCIAL HISTORY: History   Social History  . Marital Status: Married    Spouse Name: N/A    Number of Children: N/A  . Years of Education: N/A   Occupational History  . Not on file.   Social History Main Topics  . Smoking status: Never Smoker   . Smokeless tobacco: Never Used  . Alcohol Use: No  . Drug Use: No  . Sexual Activity: Yes    Partners: Male    Birth Control/ Protection: None   Other Topics Concern  . Not on file   Social History Narrative   hhof 2    2 Children at college and  beyond      [name] full time   Married    Retired age 64 2015    FAMILY HISTORY: Family History  Problem Relation Age of Onset  . Stroke Mother     died age 91  . Hypertension Mother   . Coronary artery disease Father     died age 28  . Hypertension Maternal Uncle   . Hypertension Maternal Uncle   . Diabetes Brother   .  Hypertension Father   . Hypertension Brother     x2  . Heart disease Mother     ALLERGIES:  is allergic to neomycin; tramadol; and ace inhibitors.  MEDICATIONS:  Current Outpatient Prescriptions  Medication Sig Dispense Refill  . amLODipine (NORVASC) 5 MG tablet Take 1 tablet (5 mg total) by mouth daily.  90 tablet  3  . CALCIUM PO Take by mouth 2 (two) times daily.      . clobetasol cream (TEMOVATE) 0.05 %       . methocarbamol (ROBAXIN) 750 MG tablet       . metoprolol succinate (TOPROL-XL) 100 MG 24 hr tablet Take 1 tablet (100 mg total) by mouth daily. Take with or immediately following a meal.  90 tablet  3  .  Multiple Vitamins-Minerals (EYE VITAMINS PO) Take 1 capsule by mouth 2 (two) times daily. "Eye caps"      . Multiple Vitamins-Minerals (MULTIVITAMIN PO) Take 1 tablet by mouth daily.       . naproxen sodium (ANAPROX) 220 MG tablet Take 220-440 mg by mouth 2 (two) times daily as needed (back and and shoulder pain).      . nitroGLYCERIN (NITRODUR - DOSED IN MG/24 HR) 0.2 mg/hr patch Place onto the skin. 1/4 patch daily.  Apply to right heel for Achilles tendon tear.      . Omega-3 Fatty Acids (FISH OIL PO) Take 1 capsule by mouth daily.       [name] SYNTHROID 137 MCG tablet TAKE 1 TABLET (137 MCG TOTAL) BY MOUTH DAILY.  90 tablet  3  . triamterene-hydrochlorothiazide (MAXZIDE-25) 37.5-25 MG per tablet TAKE 1 TABLET EVERY DAY  90 tablet  3   No current facility-administered medications for this visit.    REVIEW OF SYSTEMS:   Constitutional: Denies fevers, chills Eyes: Denies blurriness of vision, double vision or watery eyes Ears, nose, mouth, throat, and face: Denies mucositis or sore throat Respiratory: Denies cough, dyspnea or wheezes Cardiovascular: Denies palpitation, chest discomfort or lower extremity swelling Gastrointestinal:  Denies nausea, heartburn or change in bowel habits Lymphatics: Denies new lymphadenopathy or easy bruising Neurological:Denies numbness, tingling or new weaknesses Behavioral/Psych: Mood is stable, no new changes  All other systems were reviewed with the patient and are negative.  PHYSICAL EXAMINATION: ECOG PERFORMANCE STATUS: 0 - Asymptomatic  Filed Vitals:   03/13/14 1359  BP: 136/72  Pulse: 79  Temp: 97.6 F (36.4 C)  Resp: 18   Filed Weights   03/13/14 1359  Weight: 240 lb (108.863 kg)    GENERAL:alert, no distress and comfortable. She is morbidly obese  SKIN: skin color, texture, turgor are normal, no rashes or significant lesions EYES: normal, conjunctiva are pink and non-injected, sclera clear OROPHARYNX:no exudate, no erythema and lips, buccal  mucosa, and tongue normal  NECK: supple, thyroid normal size, non-tender, without nodularity LYMPH:  no palpable lymphadenopathy in the cervical, axillary or inguinal LUNGS: clear to auscultation and percussion with normal breathing effort HEART: regular rate & rhythm and no murmurs and no lower extremity edema ABDOMEN:abdomen soft, non-tender and normal bowel sounds. No splenomegaly  Musculoskeletal:no cyanosis of digits and no clubbing  PSYCH: alert & oriented x 3 with fluent speech NEURO: no focal motor/sensory deficits  LABORATORY DATA:  I have reviewed the data as listed Lab Results  Component Value Date   WBC 7.7 02/15/2014   HGB 16.1* 02/15/2014   HCT 47.9* 02/15/2014   MCV 90.3 02/15/2014   PLT 271.0 02/15/2014  Recent Labs  07/11/13 1530 02/15/14 0838  NA 136 137  K 3.8 3.9  CL 99 99  CO2 26 30  GLUCOSE 99 100*  BUN 23 21  CREATININE 0.76 0.8  CALCIUM 9.7 9.1  GFRNONAA >90  --   GFRAA >90  --   PROT  --  7.5  ALBUMIN  --  3.8  AST  --  21  ALT  --  24  ALKPHOS  --  62  BILITOT  --  0.7  BILIDIR  --  0.0   ASSESSMENT & PLAN:  #1 chronic erythrocytosis #2 chronic hypertension #3 chronic skin itching #4 chronic night sweats The cause of erythrocytosis is unknown. I will proceed with additional testing to rule out myeloproliferative disorder. In the meantime, I recommend she take aspirin 81 mg daily to prevent risk of thrombosis. I will see her back next week to review test results.  Orders Placed This Encounter  Procedures  . CBC with Differential    Standing Status: Future     Number of Occurrences:      Standing Expiration Date: 03/13/2015  . JAK2 genotypr    Standing Status: Future     Number of Occurrences:      Standing Expiration Date: 03/13/2015  . Erythropoietin    Standing Status: Future     Number of Occurrences:      Standing Expiration Date: 03/13/2015    All questions were answered. The patient knows to call the clinic with any problems,  questions or concerns. I spent 40 minutes counseling the patient face to face. The total time spent in the appointment was 55 minutes and more than 50% was on counseling.     Heath Lark, MD 03/13/2014 2:21 PM

## 2014-03-13 NOTE — Telephone Encounter (Signed)
Gave pt appt for Md only,sent pt to lab today

## 2014-03-16 LAB — ERYTHROPOIETIN: Erythropoietin: 9 m[IU]/mL (ref 2.6–18.5)

## 2014-03-18 ENCOUNTER — Other Ambulatory Visit: Payer: Self-pay | Admitting: Family Medicine

## 2014-03-21 ENCOUNTER — Ambulatory Visit (HOSPITAL_BASED_OUTPATIENT_CLINIC_OR_DEPARTMENT_OTHER): Admitting: Hematology and Oncology

## 2014-03-21 ENCOUNTER — Telehealth: Payer: Self-pay | Admitting: Hematology and Oncology

## 2014-03-21 ENCOUNTER — Encounter: Payer: Self-pay | Admitting: Hematology and Oncology

## 2014-03-21 VITALS — BP 148/56 | HR 80 | Temp 97.4°F | Resp 18 | Ht 59.0 in | Wt 239.1 lb

## 2014-03-21 DIAGNOSIS — D582 Other hemoglobinopathies: Secondary | ICD-10-CM

## 2014-03-21 DIAGNOSIS — D751 Secondary polycythemia: Secondary | ICD-10-CM

## 2014-03-21 DIAGNOSIS — D3A090 Benign carcinoid tumor of the bronchus and lung: Secondary | ICD-10-CM

## 2014-03-21 NOTE — Telephone Encounter (Signed)
Pt referred to Dr Melvyn Novas pulmonologist per Dr Alvy Bimler

## 2014-03-21 NOTE — Progress Notes (Signed)
[place] OFFICE PROGRESS NOTE  [name], MD DIAGNOSIS:  Chronic erythrocytosis  SUMMARY OF HEMATOLOGIC HISTORY: The patient have chronic elevated hemoglobin since 2008 to present. The number ranges from 15-16.1. Most the of the blood work was done in the setting of routine primary care visit.She denies prior diagnosis of blood clot. Denies smoking history. She was never diagnosed with obstructive sleep apnea and was never told she snores excessively. She had history of lung surgery in the past and was diagnosed with carcinoid tumor, discovered incidentally.  INTERVAL HISTORY: Nichole Smith 58 y.o. female returns for further followup. She feels well apart from just fatigue  I have reviewed the past medical history, past surgical history, social history and family history with the patient and they are unchanged from previous note.  ALLERGIES:  is allergic to neomycin; tramadol; and ace inhibitors.  MEDICATIONS:  Current Outpatient Prescriptions  Medication Sig Dispense Refill  . amLODipine (NORVASC) 5 MG tablet Take 1 tablet (5 mg total) by mouth daily.  90 tablet  3  . aspirin 81 MG tablet Take 81 mg by mouth daily.      [name] CALCIUM PO Take by mouth 2 (two) times daily.      . clobetasol cream (TEMOVATE) 0.05 %       . methocarbamol (ROBAXIN) 750 MG tablet       . metoprolol succinate (TOPROL-XL) 100 MG 24 hr tablet Take 1 tablet (100 mg total) by mouth daily. Take with or immediately following a meal.  90 tablet  3  . Multiple Vitamins-Minerals (EYE VITAMINS PO) Take 1 capsule by mouth 2 (two) times daily. "Eye caps"      . Multiple Vitamins-Minerals (MULTIVITAMIN PO) Take 1 tablet by mouth daily.       . naproxen sodium (ANAPROX) 220 MG tablet Take 220-440 mg by mouth 2 (two) times daily as needed (back and and shoulder pain).      . nitroGLYCERIN (NITRODUR - DOSED IN MG/24 HR) 0.2 mg/hr patch Place onto the skin. 1/4 patch daily.  Apply to right heel for  Achilles tendon tear.      . Omega-3 Fatty Acids (FISH OIL PO) Take 1 capsule by mouth daily.       [name] SYNTHROID 137 MCG tablet TAKE 1 TABLET (137 MCG TOTAL) BY MOUTH DAILY.  90 tablet  3  . triamterene-hydrochlorothiazide (MAXZIDE-25) 37.5-25 MG per tablet TAKE 1 TABLET EVERY DAY  90 tablet  3   No current facility-administered medications for this visit.     REVIEW OF SYSTEMS:   All other systems were reviewed with the patient and are negative.  PHYSICAL EXAMINATION: ECOG PERFORMANCE STATUS: 0 - Asymptomatic  Filed Vitals:   03/21/14 1039  BP: 148/56  Pulse: 80  Temp: 97.4 F (36.3 C)  Resp: 18   Filed Weights   03/21/14 1039  Weight: 239 lb 1.6 oz (108.455 kg)    GENERAL:alert, no distress and comfortable. She is morbidly obese NEURO: alert & oriented x 3 with fluent speech, no focal motor/sensory deficits  LABORATORY DATA:  I have reviewed the data as listed No results found for this or any previous visit (from the past 48 hour(s)).  Lab Results  Component Value Date   WBC 10.0 03/13/2014   HGB 15.4 03/13/2014   HCT 45.3 03/13/2014   MCV 89.0 03/13/2014   PLT 245 03/13/2014   ASSESSMENT & PLAN:  #1 chronic erythrocytosis #2 history of carcinoid tumor status post lung resection  I suspect the patient may have undiagnosed obstructive sleep apnea. Peripheral blood test for erythropoietin level and JAK2 mutation was negative. I recommend pulmonologist evaluation and she agreed to proceed. In the meantime I recommend she take aspirin indefinitely. All questions were answered. The patient knows to call the clinic with any problems, questions or concerns. No barriers to learning was detected.  I spent 15 minutes counseling the patient face to face. The total time spent in the appointment was 20 minutes and more than 50% was on counseling.     Heath Lark, MD 03/21/2014 12:54 PM

## 2014-03-22 ENCOUNTER — Encounter: Payer: Self-pay | Admitting: Internal Medicine

## 2014-03-22 ENCOUNTER — Ambulatory Visit (INDEPENDENT_AMBULATORY_CARE_PROVIDER_SITE_OTHER): Admitting: Internal Medicine

## 2014-03-22 VITALS — BP 134/82 | HR 67 | Temp 97.4°F | Ht 59.0 in | Wt 240.8 lb

## 2014-03-22 DIAGNOSIS — D751 Secondary polycythemia: Secondary | ICD-10-CM

## 2014-03-22 DIAGNOSIS — D3A090 Benign carcinoid tumor of the bronchus and lung: Secondary | ICD-10-CM

## 2014-03-22 NOTE — Patient Instructions (Addendum)
Please see patient coordinator before you leave today  to schedule overnight oxygen level on room air and we will call results  In meantime work on weight loss and consider elevating the head of your bed slightly (you can buy 6 inch bed blocks at many hardware and mattress stores)

## 2014-03-22 NOTE — Progress Notes (Signed)
   Subjective:    Patient ID: Nichole Smith, female    DOB: 11/06/1956  MRN: [id number]  HPI  21 yowf never smoker s/p  RM lobectomy 06/2011 for carcinoid with h/o high Hgb 16.4 since 2008 referred by Alvy Bimler   03/22/2014 1st [facility] office visit/ Devondre Guzzetta  Chief Complaint  Patient presents with  . Advice Only    Refer Dr. Marletta Lor elevated hemoglobin since 2008 to present and hx of carcinoid tumor DX  Not limited by breathing from desired activities  [place]ills legs give out first. Does pool aerobics 3 h per week  Sleepy after lunch, most of the time feel good, no am ha - husband says snores but no apnea (he has osa on cpap) .  No obvious day to day or daytime variabilty or assoc chronic cough or cp or chest tightness, subjective wheeze overt sinus or hb symptoms. No unusual exp hx or h/o childhood pna/ asthma or knowledge of premature birth.  Sleeping ok without nocturnal  or early am exacerbation  of respiratory  c/o's or need for noct saba. Also denies any obvious fluctuation of symptoms with weather or environmental changes or other aggravating or alleviating factors except as outlined above   Current Medications, Allergies, Complete Past Medical History, Past Surgical History, Family History, and Social History were reviewed in [employer] record.           Review of Systems  Constitutional: Negative for fever and unexpected weight change.  HENT: Negative for congestion, dental problem, ear pain, nosebleeds, postnasal drip, rhinorrhea, sinus pressure, sneezing, sore throat and trouble swallowing.   Eyes: Negative for redness and itching.  Respiratory: Negative for cough, chest tightness, shortness of breath and wheezing.   Cardiovascular: Negative for palpitations and leg swelling.  Gastrointestinal: Negative for nausea and vomiting.  Genitourinary: Negative for dysuria.  Musculoskeletal: Negative for joint swelling.  Skin: Negative for  rash.  Neurological: Negative for headaches.  Hematological: Does not bruise/bleed easily.  Psychiatric/Behavioral: Negative for dysphoric mood. The patient is not nervous/anxious.        Objective:   Physical Exam  Wt Readings from Last 3 Encounters:  03/22/14 240 lb 12.8 oz (109.226 kg)  03/21/14 239 lb 1.6 oz (108.455 kg)  03/13/14 240 lb (108.863 kg)      HEENT: nl dentition, turbinates, and orophanx. Nl external ear canals without cough reflex   NECK :  without JVD/Nodes/TM/ nl carotid upstrokes bilaterally   LUNGS: no acc muscle use, clear to A and P bilaterally without cough on insp or exp maneuvers   CV:  RRR  no s3 or murmur or increase in P2, no edema   ABD:  soft and nontender with nl excursion in the supine position. No bruits or organomegaly, bowel sounds nl  MS:  warm without deformities, calf tenderness, cyanosis or clubbing  SKIN: warm and dry without lesions    NEURO:  alert, approp, no deficits     10/19/13 cxr  The heart size and vascular pattern are normal. There are surgical  clips in the right hilum, which are stable. The lungs are clear.  There are no pleural effusions.       Assessment & Plan:

## 2014-03-23 NOTE — Assessment & Plan Note (Signed)
S/p RMLobectomy 06/2011 > total excision, no adjuvant rx

## 2014-03-23 NOTE — Assessment & Plan Note (Addendum)
03/22/2014  Walked RA x 3 laps @ 185 ft each stopped due to end of study, no desats  HC03 in low 30s suggest she has an element of OHS that may cause enough noct hypoxemia to cause mild polycythemia  She is not excite about wearing cpap as familiar with it per husband so re ono RA and see if does desat if can correct this and normalize her hgb. If not, needs formal sleep study   In the meantime needs to make every effort to lose wt

## 2014-04-16 ENCOUNTER — Encounter: Payer: Self-pay | Admitting: Internal Medicine

## 2014-04-17 ENCOUNTER — Telehealth: Payer: Self-pay | Admitting: Internal Medicine

## 2014-04-17 DIAGNOSIS — D751 Secondary polycythemia: Secondary | ICD-10-CM

## 2014-04-17 NOTE — Telephone Encounter (Addendum)
Per MW-ONO pos- needs to start o2 2lpm and redo ONO on 2lpm  Pt needs to f/u in 1 month per MW  Pt was leaving when I called her with the results, and she will call back later  Will go ahead and order ONO and O2

## 2014-04-18 ENCOUNTER — Other Ambulatory Visit: Payer: Self-pay | Admitting: Internal Medicine

## 2014-04-20 ENCOUNTER — Ambulatory Visit (INDEPENDENT_AMBULATORY_CARE_PROVIDER_SITE_OTHER): Admitting: Internal Medicine

## 2014-04-20 ENCOUNTER — Encounter: Payer: Self-pay | Admitting: Internal Medicine

## 2014-04-20 VITALS — BP 122/70 | HR 77 | Ht 60.0 in | Wt 234.0 lb

## 2014-04-20 DIAGNOSIS — D751 Secondary polycythemia: Secondary | ICD-10-CM

## 2014-04-20 NOTE — Progress Notes (Signed)
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 Secondary polycycthemia   History of Present Illness   03/22/2014 1st [facility] office visit/ Wert  Chief Complaint  Patient presents with  . Advice Only    Refer Dr. Marletta Lor elevated hemoglobin since 2008 to present and hx of carcinoid tumor DX  Not limited by breathing from desired activities  [place]ills legs give out first. Does pool aerobics 3 h per week  Sleepy after lunch, most of the time feel good, no am ha - husband says snores but no apnea (he has osa on cpap)  Rec: ono RA 04/17/14  > desat x 1 42 min > rec 02    04/20/2014 f/u ov/Wert re:  Chief Complaint  Patient presents with  . Follow-up    Pt has no breathing complaints, has questions about her 02 qhs.  In retrospect has some am drowsiness "but I'm retired" No doe at all, no am ha.  No obvious day to day or daytime variabilty or assoc chronic cough or cp or chest tightness, subjective wheeze overt sinus or hb symptoms. No unusual exp hx or h/o childhood pna/ asthma or knowledge of premature birth.  Sleeping ok without nocturnal  or early am exacerbation  of respiratory  c/o's or need for noct saba. Also denies any obvious fluctuation of symptoms with weather or environmental changes or other aggravating or alleviating factors except as outlined above   Current Medications, Allergies, Complete Past Medical History, Past Surgical History, Family History, and Social History were reviewed in [employer] record.  ROS  The following are not active complaints unless bolded sore throat, dysphagia, dental problems, itching, sneezing,  nasal congestion or excess/ purulent secretions, ear ache,   fever, chills, sweats,  unintended wt loss, pleuritic or exertional cp, hemoptysis,  orthopnea pnd or leg swelling, presyncope, palpitations, heartburn, abdominal pain, anorexia, nausea, vomiting, diarrhea  or change in bowel or urinary habits, change in stools or urine, dysuria,hematuria,  rash, arthralgias, visual complaints, headache, numbness weakness or ataxia or problems with walking or coordination,  change in mood/affect or memory.        Objective:   Physical Exam  04/20/2014       234  Wt Readings from Last 3 Encounters:  03/22/14 240 lb 12.8 oz (109.226 kg)  03/21/14 239 lb 1.6 oz (108.455 kg)  03/13/14 240 lb (108.863 kg)      HEENT: nl dentition, turbinates, and orophanx. Nl external ear canals without cough reflex   NECK :  without JVD/Nodes/TM/ nl carotid upstrokes bilaterally   LUNGS: no acc muscle use, clear to A and P bilaterally without cough on insp or exp maneuvers   CV:  RRR  no s3 or murmur or increase in P2, no edema   ABD:  soft and nontender with nl excursion in the supine position. No bruits or organomegaly, bowel sounds nl  MS:  warm without deformities, calf tenderness, cyanosis or clubbing  SKIN: warm and dry without lesions    NEURO:  alert, approp, no deficits     10/19/13 cxr  The heart size and vascular pattern are normal. There are surgical  clips in the right hilum, which are  stable. The lungs are clear.  There are no pleural effusions.       Assessment & Plan:

## 2014-04-20 NOTE — Patient Instructions (Signed)
Please see patient coordinator before you leave today  to schedule for overnight oximetry on 2lpm and if it's still low or your drowsiness doesn't get better you need a sleep study   Weight control is simply a matter of calorie balance which needs to be tilted in your favor by eating less and exercising more.  To get the most out of exercise, you need to be continuously aware that you are short of breath, but never out of breath, for 30 minutes daily. As you improve, it will actually be easier for you to do the same amount of exercise  in  30 minutes so always push to the level where you are short of breath.  If this does not result in gradual weight reduction then I strongly recommend you see a nutritionist with a food diary x 2 weeks so that we can work out a negative calorie balance which is universally effective in steady weight loss programs.  Think of your calorie balance like you do your bank account where in this case you want the balance to go down so you must take in less calories than you burn up.  It's just that simple:  Hard to do, but easy to understand.  Good luck!   Pulmonary follow up is as needed

## 2014-04-20 NOTE — Assessment & Plan Note (Signed)
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

## 2014-05-15 ENCOUNTER — Encounter: Payer: Self-pay | Admitting: Internal Medicine

## 2014-05-16 ENCOUNTER — Encounter: Payer: Self-pay | Admitting: Internal Medicine

## 2014-05-16 DIAGNOSIS — G4734 Idiopathic sleep related nonobstructive alveolar hypoventilation: Secondary | ICD-10-CM

## 2014-05-16 NOTE — Telephone Encounter (Signed)
Please see attached message from patient--concerns of O2 and questions concerning CPAP  Please advise Dr Melvyn Novas. Thanks.

## 2014-05-23 ENCOUNTER — Other Ambulatory Visit: Payer: Self-pay | Admitting: Internal Medicine

## 2014-05-23 DIAGNOSIS — D1430 Benign neoplasm of unspecified bronchus and lung: Secondary | ICD-10-CM

## 2014-05-26 ENCOUNTER — Encounter: Payer: Self-pay | Admitting: Internal Medicine

## 2014-05-26 ENCOUNTER — Ambulatory Visit (HOSPITAL_COMMUNITY): Attending: Internal Medicine | Admitting: Radiology

## 2014-05-26 DIAGNOSIS — R002 Palpitations: Secondary | ICD-10-CM | POA: Insufficient documentation

## 2014-05-26 DIAGNOSIS — D3A090 Benign carcinoid tumor of the bronchus and lung: Secondary | ICD-10-CM | POA: Insufficient documentation

## 2014-05-26 DIAGNOSIS — D45 Polycythemia vera: Secondary | ICD-10-CM | POA: Insufficient documentation

## 2014-05-26 DIAGNOSIS — D1430 Benign neoplasm of unspecified bronchus and lung: Secondary | ICD-10-CM

## 2014-05-26 DIAGNOSIS — G4733 Obstructive sleep apnea (adult) (pediatric): Secondary | ICD-10-CM | POA: Insufficient documentation

## 2014-05-26 NOTE — Progress Notes (Addendum)
Echocardiogram performed with a Bubble Study. IV started by Mariann Laster Deal with a # 22g in right ant.

## 2014-05-26 NOTE — Progress Notes (Signed)
Quick Note:  ATC the ECHO lab and could not get an answer WCB ______

## 2014-05-30 ENCOUNTER — Encounter: Payer: Self-pay | Admitting: Internal Medicine

## 2014-05-30 NOTE — Progress Notes (Signed)
Quick Note:  Spoke with pt and notified of results per Dr. Wert. Pt verbalized understanding and denied any questions.  ______ 

## 2014-06-15 ENCOUNTER — Encounter: Payer: Self-pay | Admitting: Internal Medicine

## 2014-06-16 NOTE — Telephone Encounter (Signed)
PCC's- Please advise if this PSG can be moved up some? Thanks!

## 2014-06-20 ENCOUNTER — Ambulatory Visit (INDEPENDENT_AMBULATORY_CARE_PROVIDER_SITE_OTHER): Admitting: Internal Medicine

## 2014-06-20 ENCOUNTER — Encounter: Payer: Self-pay | Admitting: Internal Medicine

## 2014-06-20 VITALS — BP 124/74 | Temp 98.5°F | Ht 59.5 in | Wt 227.0 lb

## 2014-06-20 DIAGNOSIS — D582 Other hemoglobinopathies: Secondary | ICD-10-CM

## 2014-06-20 DIAGNOSIS — E669 Obesity, unspecified: Secondary | ICD-10-CM

## 2014-06-20 DIAGNOSIS — M545 Low back pain, unspecified: Secondary | ICD-10-CM | POA: Insufficient documentation

## 2014-06-20 DIAGNOSIS — I10 Essential (primary) hypertension: Secondary | ICD-10-CM

## 2014-06-20 MED ORDER — METHOCARBAMOL 750 MG PO TABS: ORAL_TABLET | ORAL | Status: DC

## 2014-06-20 NOTE — Patient Instructions (Addendum)
Continue  Weight loss and bp control. Can use muscle relaxant  as needed. Can go through with  Physical therapy if needed. Preventive visit in march 16   Can call and get   Cbc  done  If wish in meantime.

## 2014-06-20 NOTE — Progress Notes (Signed)
Pre visit review using our clinic review tool, if applicable. No additional management support is needed unless otherwise documented below in the visit note.  Chief Complaint  Patient presents with  . Follow-up    Weight blood pressure number of issues.    HPI: Nichole Smith fu of bp control and weight management  Since last visit has seen hematology and pulm about her ongoing mildly elevated hemoglobin erythrocytosis  Unknown cause   .  Is felt to be a secondary elevation with question of hypoxia at night    Nl echo. Getting  Sleep evaluation.  UsingO2 in the meantime. Apparatus is too big to take to travel\ she is taking aspirin because of the hemoglobin of 15.4 range Trying to lose weight and is  Using   journaling   and fit bit and trying different . Techniques activities has lost about 10 pounds he is like she should be losing more. takking aleve a lot coccyx pain. Feels this is mechanical no recent injury ill doing water exercise. No associated neurologic symptoms with this ? Muscle relaxant . In the past Robaxin asks for something stronger or different dosing. ROS: See pertinent positives and negatives per HPI. No new chest pain shortness of breath cardiovascular symptoms.  Past Medical History  Diagnosis Date  . Hypertension   . Arthritis   . Carcinoid tumor of lung 2012    right found incidentally on chest xray eval for r/o vasxculitis   . 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 HNP (herniated nucleus pulposus), cervical 01/22/2011    C6-C7  Has seen dr Vertell Limber for this and Dr Tonita Cong  Positional numbness in hands no weakness    . Hypothyroidism   . Endometrial cells on cervical Pap smear inconsistent w/LMP 6/11    benign proliferative endometrium on endometrial biopsy  . SUI (stress urinary incontinence, female)     h/o  . Dysrhythmia     irregular occassional  . Double vision     Family History  Problem Relation Age of Onset  . Stroke Mother     died age 58  .  Hypertension Mother   . Coronary artery disease Father     died age 32  . Hypertension Maternal Uncle   . Parkinson's disease Brother   . Diabetes Brother   . Hypertension Father   . Hypertension Brother     x2  . Heart disease Mother     History   Social History  . Marital Status: Married    Spouse Name: N/A    Number of Children: 3  . Years of Education: N/A   Occupational History  . retied    Social History Main Topics  . Smoking status: Never Smoker   . Smokeless tobacco: Never Used  . Alcohol Use: Yes     Comment: twice per week or less  . Drug Use: No  . Sexual Activity: Yes    Partners: Male    Birth Control/ Protection: None   Other Topics Concern  . None   Social History Narrative   hhof 2    2 Children at college and  beyond      [name] full time   Married    Retired age 89 2015    Outpatient Encounter Prescriptions as of 06/20/2014  Medication Sig  . amLODipine (NORVASC) 5 MG tablet Take 1 tablet (5 mg total) by mouth daily.  [name] aspirin 81 MG tablet Take 81 mg by mouth daily.  [name]  CALCIUM PO Take by mouth 2 (two) times daily.  . clobetasol cream (TEMOVATE) 0.05 % as needed.   . metoprolol succinate (TOPROL-XL) 100 MG 24 hr tablet Take 1 tablet (100 mg total) by mouth daily. Take with or immediately following a meal.  . Multiple Vitamins-Minerals (EYE VITAMINS PO) Take 1 capsule by mouth 2 (two) times daily. "Eye caps"  . Multiple Vitamins-Minerals (MULTIVITAMIN PO) Take 1 tablet by mouth daily.   . naproxen sodium (ANAPROX) 220 MG tablet Take 220-440 mg by mouth 2 (two) times daily as needed (back and and shoulder pain).  . Omega-3 Fatty Acids (FISH OIL PO) Take 1 capsule by mouth daily.   [name] SYNTHROID 137 MCG tablet TAKE 1 TABLET BY MOUTH EVERY DAY  . triamterene-hydrochlorothiazide (MAXZIDE-25) 37.5-25 MG per tablet TAKE 1 TABLET EVERY DAY  . [DISCONTINUED] methocarbamol (ROBAXIN) 750 MG tablet as needed.   . methocarbamol (ROBAXIN) 750 MG  tablet 1-2 po tid  PO for muscle spasm    EXAM:  BP 124/74  Temp(Src) 98.5 F (36.9 C) (Oral)  Ht 4' 11.5" (1.511 m)  Wt 227 lb (102.967 kg)  BMI 45.10 kg/m2  LMP 02/12/2011  Body mass index is 45.1 kg/(m^2).  GENERAL: vitals reviewed and listed above, alert, oriented, appears well hydrated and in no acute distress HEENT: atraumatic, conjunctiva  clear, no obvious abnormalities on inspection of external nose and ears NECK: no obvious masses on inspection palpation  LUNGS: clear to auscultation bilaterally, no wheezes, rales or rhonchi,  CV: HRRR, no clubbing cyanosis or  peripheral edema nl cap refill  slightly uncomfortable back pain down near the distal sacrum coccyx areas but gait is non-focal. Negative SLR. MS: moves all extremities without noticeable focal  abnormality PSYCH: pleasant and cooperative, no obvious depression or anxiety Lab Results  Component Value Date   WBC 10.0 03/13/2014   HGB 15.4 03/13/2014   HCT 45.3 03/13/2014   PLT 245 03/13/2014   GLUCOSE 100* 02/15/2014   CHOL 153 02/15/2014   TRIG 73.0 02/15/2014   HDL 48.50 02/15/2014   LDLCALC 90 02/15/2014   ALT 24 02/15/2014   AST 21 02/15/2014   NA 137 02/15/2014   K 3.9 02/15/2014   CL 99 02/15/2014   CREATININE 0.8 02/15/2014   BUN 21 02/15/2014   CO2 30 02/15/2014   TSH 1.79 02/15/2014   INR 0.96 06/11/2011   BP Readings from Last 3 Encounters:  06/20/14 124/74  04/20/14 122/70  03/22/14 134/82   Wt Readings from Last 3 Encounters:  06/20/14 227 lb (102.967 kg)  04/20/14 234 lb (106.142 kg)  03/22/14 240 lb 12.8 oz (109.226 kg)     ASSESSMENT AND PLAN:  Discussed the following assessment and plan:  Obesity, unspecified - Encouraged and reviewed has lost some weight  Elevated hemoglobin - Felt to be secondary possibly from nocturnal hypoxia under evaluation and treatment. No primary blood disease  Unspecified essential hypertension - Controlled  Midline low back pain without sciatica - Seems  mechanical no other alarm features continue water exercise Robaxin higher dose if needed physical therapy prescription given if needed  -Patient advised to return or notify health care team  if symptoms worsen ,persist or new concerns arise.  Patient Instructions  Continue  Weight loss and bp control. Can use muscle relaxant  as needed. Can go through with  Physical therapy if needed. Preventive visit in march 16   Can call and get   Cbc  done  If wish in meantime.  Standley Brooking. Panosh M.D.

## 2014-06-26 LAB — HM MAMMOGRAPHY: HM Mammogram: NORMAL

## 2014-06-28 ENCOUNTER — Encounter: Payer: Self-pay | Admitting: Internal Medicine

## 2014-07-07 ENCOUNTER — Ambulatory Visit (HOSPITAL_BASED_OUTPATIENT_CLINIC_OR_DEPARTMENT_OTHER): Attending: Pulmonary Disease

## 2014-07-07 VITALS — Ht 60.0 in | Wt 222.0 lb

## 2014-07-07 DIAGNOSIS — G4733 Obstructive sleep apnea (adult) (pediatric): Secondary | ICD-10-CM | POA: Diagnosis not present

## 2014-07-07 DIAGNOSIS — I491 Atrial premature depolarization: Secondary | ICD-10-CM | POA: Insufficient documentation

## 2014-07-07 DIAGNOSIS — G4734 Idiopathic sleep related nonobstructive alveolar hypoventilation: Secondary | ICD-10-CM

## 2014-07-07 DIAGNOSIS — G4710 Hypersomnia, unspecified: Secondary | ICD-10-CM | POA: Diagnosis present

## 2014-07-12 ENCOUNTER — Other Ambulatory Visit: Payer: Self-pay | Admitting: Family Medicine

## 2014-07-12 ENCOUNTER — Encounter: Payer: Self-pay | Admitting: Internal Medicine

## 2014-07-12 MED ORDER — SYNTHROID 137 MCG PO TABS
ORAL_TABLET | ORAL | Status: DC
Start: 2014-07-12 — End: 2015-03-20

## 2014-07-17 ENCOUNTER — Encounter: Payer: Self-pay | Admitting: Family Medicine

## 2014-07-17 ENCOUNTER — Encounter: Payer: Self-pay | Admitting: Internal Medicine

## 2014-07-17 DIAGNOSIS — G4710 Hypersomnia, unspecified: Secondary | ICD-10-CM

## 2014-07-17 DIAGNOSIS — G4730 Sleep apnea, unspecified: Secondary | ICD-10-CM

## 2014-07-17 NOTE — Sleep Study (Signed)
   NAME: Nichole Smith DATE OF BIRTH:  July 19, 1956 MEDICAL RECORD NUMBER [id number]  LOCATION: [facility]  PHYSICIAN: Kathee Delton  DATE OF STUDY: 07/07/2014  SLEEP STUDY TYPE: Nocturnal Polysomnogram               REFERRING PHYSICIAN: Tanda Rockers, MD  INDICATION FOR STUDY: Hypersomnia with sleep apnea  EPWORTH SLEEPINESS SCORE:  15 HEIGHT: 5' (152.4 cm)  WEIGHT: 222 lb (100.699 kg)    Body mass index is 43.36 kg/(m^2).  NECK SIZE: 16 in.  MEDICATIONS: Reviewed in the sleep record  SLEEP ARCHITECTURE: The patient had a total sleep time of 317 minutes, with very little slow-wave sleep and decreased quantity of REM. Sleep onset latency was normal at 15 minutes, and sleep efficiency was 68% during the diagnostic portion of the study, an 86% during the titration portion.  RESPIRATORY DATA: The patient underwent went a split night study where she was found to have 171 obstructive events in the first 121 minutes of sleep. This gave her an AHI of 85 events per hour during the diagnostic portion of the study. The events were not positional, and there was loud snoring noted throughout. By protocol, the patient was fitted with a Simplus full face mask, and found to have an optimal pressure of 10 cm of water.  OXYGEN DATA: There was oxygen desaturation transiently as low as 81% during the night.  CARDIAC DATA: Rare PAC noted  MOVEMENT/PARASOMNIA: Large numbers of limb movements were noted during the night that did not appear periodic in nature, nor did they result in sleep disruption. More than likely, these are related to the patient's sleep disordered breathing. No abnormal behaviors were seen.  IMPRESSION/ RECOMMENDATION:    1) split-night study reveals severe obstructive sleep apnea, with an AHI of 85 events per hour and oxygen desaturation as low as 81% during the diagnostic portion of the study. The patient was then fitted with a Simplus full face mask, and  found to have an optimal CPAP pressure of 10 cm of water. The patient should also be encouraged to work aggressively on weight loss.  2) rare PAC noted, but no clinically significant arrhythmias were seen.     Kathee Delton Diplomate, American Board of Sleep Medicine  ELECTRONICALLY SIGNED ON:  07/17/2014, 8:54 AM [place] PH: [phone]   FX: [phone] [place]

## 2014-07-18 ENCOUNTER — Encounter: Payer: Self-pay | Admitting: Internal Medicine

## 2014-07-18 ENCOUNTER — Encounter: Payer: Self-pay | Admitting: Family Medicine

## 2014-07-25 ENCOUNTER — Encounter: Payer: Self-pay | Admitting: Internal Medicine

## 2014-07-25 DIAGNOSIS — G4733 Obstructive sleep apnea (adult) (pediatric): Secondary | ICD-10-CM

## 2014-07-25 NOTE — Telephone Encounter (Signed)
Dr. Melvyn Novas have you seen any results on this pt sleep study? Please advise. [place] Bing, CMA

## 2014-07-26 NOTE — Telephone Encounter (Signed)
8.25.15 mychart message from pt: Message     Not clear what happened but I saw the report and rec 10 cpap trial and f/u with sleep doc was supposed to be scheduled already. Please expedite both   Dr Melvyn Novas, unsure where pt saw these recommendations.  Please advise if you have received the sleep study results and your recommendations.  Thanks!

## 2014-07-27 ENCOUNTER — Other Ambulatory Visit: Payer: Self-pay | Admitting: Internal Medicine

## 2014-07-27 ENCOUNTER — Encounter: Payer: Self-pay | Admitting: Internal Medicine

## 2014-07-27 DIAGNOSIS — G4733 Obstructive sleep apnea (adult) (pediatric): Secondary | ICD-10-CM | POA: Insufficient documentation

## 2014-07-27 HISTORY — DX: Obstructive sleep apnea (adult) (pediatric): G47.33

## 2014-07-28 ENCOUNTER — Telehealth: Payer: Self-pay | Admitting: Internal Medicine

## 2014-07-28 NOTE — Telephone Encounter (Signed)
Order sent to [facility] on 07/27/14 pt aware she will not here from them for a couple of days Joellen Jersey

## 2014-07-28 NOTE — Telephone Encounter (Signed)
Pt wants to know can we send an order to discontinue oxygen at night? [place] Bing, CMA

## 2014-07-31 NOTE — Telephone Encounter (Signed)
Message from MW to triage: FW: Non-Urgent Medical Question    Tanda Rockers, MD     Sent: Sat July 29, 2014 7:01 AM    To: P [facility] Triage [facility] to d/c 02    Order placed to d/c pt's o2 Email sent to pt informing her of this Will sign off

## 2014-08-18 ENCOUNTER — Telehealth: Payer: Self-pay | Admitting: Internal Medicine

## 2014-08-18 DIAGNOSIS — G4733 Obstructive sleep apnea (adult) (pediatric): Secondary | ICD-10-CM

## 2014-08-18 NOTE — Telephone Encounter (Signed)
Spoke with Nichole Smith  Order sent to [facility] for pt to have humidifier with CPAP  Nothing further needed

## 2014-08-18 NOTE — Telephone Encounter (Signed)
Nichole Smith was in clinic and with a patient at the time of my call; she will need to call us back per [facility].

## 2014-08-19 ENCOUNTER — Encounter: Payer: Self-pay | Admitting: Internal Medicine

## 2014-08-21 MED ORDER — AMLODIPINE BESYLATE 5 MG PO TABS: 5.0000 mg | ORAL_TABLET | Freq: Every day | ORAL | Status: DC

## 2014-08-21 MED ORDER — TRIAMTERENE-HCTZ 37.5-25 MG PO TABS: ORAL_TABLET | ORAL | Status: DC

## 2014-08-21 MED ORDER — METOPROLOL SUCCINATE ER 100 MG PO TB24: 100.0000 mg | ORAL_TABLET | Freq: Every day | ORAL | Status: DC

## 2014-08-21 NOTE — Telephone Encounter (Signed)
Rx sent to [facility]

## 2014-08-24 ENCOUNTER — Ambulatory Visit: Payer: 59 | Admitting: Obstetrics & Gynecology

## 2014-09-04 ENCOUNTER — Ambulatory Visit: Admitting: Family Medicine

## 2014-09-04 ENCOUNTER — Ambulatory Visit (INDEPENDENT_AMBULATORY_CARE_PROVIDER_SITE_OTHER): Admitting: Family Medicine

## 2014-09-04 DIAGNOSIS — Z23 Encounter for immunization: Secondary | ICD-10-CM

## 2014-09-11 ENCOUNTER — Ambulatory Visit (INDEPENDENT_AMBULATORY_CARE_PROVIDER_SITE_OTHER): Admitting: Pulmonary Disease

## 2014-09-11 ENCOUNTER — Encounter: Payer: Self-pay | Admitting: Pulmonary Disease

## 2014-09-11 VITALS — BP 126/74 | HR 72 | Temp 98.7°F | Ht 60.0 in | Wt 234.0 lb

## 2014-09-11 DIAGNOSIS — G4733 Obstructive sleep apnea (adult) (pediatric): Secondary | ICD-10-CM

## 2014-09-11 NOTE — Patient Instructions (Addendum)
Will adjust your pressure to the auto setting to better treat your sleep apnea.  Let me know if you are not comfortable on this setting. Work on weight loss Please call us in 4 weeks so that we can order an oxygen check overnight on your cpap device.  followup with me again in 61mos.

## 2014-09-11 NOTE — Progress Notes (Signed)
Subjective:    Patient ID: Nichole Smith, female    DOB: Jan 10, 1956, 58 y.o.   MRN: [id number]  HPI The patient is a 58 year old female who I've been asked to see for management of obstructive sleep apnea. The patient has been found to have polycythemia, and ultimately was noted to have nocturnal hypoxemia.  She underwent a sleep study in August of this year which showed severe OSA, with an AHI of 85 events per hour. She was started on CPAP and found to have an optimal pressure of 10 cm.  The patient has been started on CPAP at a pressure of 10 cm, and has seen significant difference in her symptoms. She is no longer having frequent awakenings for nocturia, and is much more rested in the mornings upon arising. She was having issues with inappropriate daytime sleepiness, and has now seen increased alertness during the day. She is using a nasal mask, and is having no issues with mouth opening. She has been on the device for 4 weeks, and her download shows excellent compliance with very little mask leak. She is having some breakthrough apnea on her pressure of 10 cm. Her Epworth score today is only 8.   Sleep Questionnaire What time do you typically go to bed?( Between what hours) 10-10:30 PM 10-10:30 PM at 1504 on 09/11/14 by Inge Rise, CMA How long does it take you to fall asleep? 10 minutes 10 minutes at 1504 on 09/11/14 by Inge Rise, CMA How many times during the night do you wake up? 2 2 at 1504 on 09/11/14 by Inge Rise, CMA What time do you get out of bed to start your day? 0530 0530 at 1504 on 09/11/14 by Inge Rise, CMA Do you drive or operate heavy machinery in your occupation? No No at 1504 on 09/11/14 by Inge Rise, CMA How much has your weight changed (up or down) over the past two years? (In pounds) 10 lb (4.536 kg) 10 lb (4.536 kg) at 1504 on 09/11/14 by Inge Rise, CMA Have you ever had a sleep study before? Yes Yes at 1504 on 09/11/14 by Inge Rise, CMA If yes, [facility] of study? [facility] at 1504 on 09/11/14 by Inge Rise, CMA If yes, date of study? 07/2014 07/2014 at 1504 on 09/11/14 by Inge Rise, CMA Do you currently use CPAP? Yes Yes at 1504 on 09/11/14 by Inge Rise, CMA If so, what pressure? 10 cm 10 cm at 1504 on 09/11/14 by Inge Rise, CMA Do you wear oxygen at any time? No    Review of Systems  Constitutional: Negative for fever and unexpected weight change.  HENT: Negative for congestion, dental problem, ear pain, nosebleeds, postnasal drip, rhinorrhea, sinus pressure, sneezing, sore throat and trouble swallowing.   Eyes: Negative for redness and itching.  Respiratory: Negative for cough, chest tightness, shortness of breath and wheezing.   Cardiovascular: Negative for palpitations and leg swelling.  Gastrointestinal: Negative for nausea and vomiting.  Genitourinary: Negative for dysuria.  Musculoskeletal: Negative for joint swelling.  Skin: Negative for rash.  Neurological: Negative for headaches.  Hematological: Does not bruise/bleed easily.  Psychiatric/Behavioral: Negative for dysphoric mood. The patient is not nervous/anxious.        Objective:   Physical Exam Constitutional:  Obese female , no acute distress  HENT:  Nares patent without discharge, but deviated septum to left with significant narrowing.  Oropharynx without exudate, palate and uvula  thick and elongated, +tosillar hypertrophy  Eyes:  Perrla, eomi, no scleral icterus  Neck:  No JVD, no TMG  Cardiovascular:  Normal rate, regular rhythm, no rubs or gallops.  1/6 sem        Intact distal pulses  Pulmonary :  Normal breath sounds, no stridor or respiratory distress   No rales, rhonchi, or wheezing  Abdominal:  Soft, nondistended, bowel sounds present.  No tenderness noted.   Musculoskeletal:  No lower extremity edema noted.  Lymph Nodes:  No cervical lymphadenopathy noted  Skin:  No cyanosis noted  Neurologic:   Alert, appropriate, moves all 4 extremities without obvious deficit.         Assessment & Plan:

## 2014-09-11 NOTE — Assessment & Plan Note (Signed)
The patient has done extremely well on CPAP the last 4 weeks, with improvement in her symptoms. Her download shows excellent compliance, but she is having some breakthrough events. I would like to put her on the auto setting, and broaden her pressure range a bit.  She is pleased with her progress so far, and I have asked her to keep working aggressively on weight loss. I will see her back in 6 months, but she is to call if she is having tolerance issues.

## 2014-10-02 ENCOUNTER — Encounter: Payer: Self-pay | Admitting: Pulmonary Disease

## 2014-12-12 ENCOUNTER — Other Ambulatory Visit: Payer: Self-pay | Admitting: Surgery

## 2014-12-12 DIAGNOSIS — D1430 Benign neoplasm of unspecified bronchus and lung: Secondary | ICD-10-CM

## 2014-12-13 ENCOUNTER — Ambulatory Visit (INDEPENDENT_AMBULATORY_CARE_PROVIDER_SITE_OTHER): Admitting: Surgery

## 2014-12-13 ENCOUNTER — Encounter: Payer: Self-pay | Admitting: Surgery

## 2014-12-13 ENCOUNTER — Ambulatory Visit
Admission: RE | Admit: 2014-12-13 | Discharge: 2014-12-13 | Disposition: A | Discharge status: Home / Self Care | Source: Ambulatory Visit | Attending: Surgery | Admitting: Surgery

## 2014-12-13 VITALS — BP 118/75 | HR 76 | Resp 20 | Ht 60.0 in | Wt 230.0 lb

## 2014-12-13 DIAGNOSIS — Z902 Acquired absence of lung [part of]: Secondary | ICD-10-CM

## 2014-12-13 DIAGNOSIS — D1431 Benign neoplasm of right bronchus and lung: Secondary | ICD-10-CM

## 2014-12-13 DIAGNOSIS — D1430 Benign neoplasm of unspecified bronchus and lung: Secondary | ICD-10-CM

## 2014-12-13 DIAGNOSIS — Z9889 Other specified postprocedural states: Secondary | ICD-10-CM

## 2014-12-13 NOTE — Progress Notes (Signed)
     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 Since I last saw her on 10/20/2013 she has been feeling well. She denies any cough or sputum production. She has had no hemoptysis. She denies any chest pain. She was suppose to return to see me 6 months later but did not have an appt scheduled for some reason. She has been diagnosed with polycythemia and OSA. She is now using nasal CPAP.    Current Outpatient Prescriptions  Medication Sig Dispense Refill  . amLODipine (NORVASC) 5 MG tablet Take 1 tablet (5 mg total) by mouth daily. 90 tablet 1  . aspirin 81 MG tablet Take 81 mg by mouth daily.    [name] CALCIUM PO Take by mouth 2 (two) times daily.    . clobetasol cream (TEMOVATE) 0.05 % as needed.     . methocarbamol (ROBAXIN) 750 MG tablet 1-2 po tid  PO for muscle spasm 60 tablet 1  . metoprolol succinate (TOPROL-XL) 100 MG 24 hr tablet Take 1 tablet (100 mg total) by mouth daily. Take with or immediately following a meal. 90 tablet 1  . Multiple Vitamins-Minerals (EYE VITAMINS PO) Take 1 capsule by mouth 2 (two) times daily. "Eye caps"    . Multiple Vitamins-Minerals (MULTIVITAMIN PO) Take 1 tablet by mouth daily.     . Omega-3 Fatty Acids (FISH OIL PO) Take 1 capsule by mouth daily.     [name] SYNTHROID 137 MCG tablet TAKE 1 TABLET BY MOUTH EVERY DAY 90 tablet 1  . triamterene-hydrochlorothiazide (MAXZIDE-25) 37.5-25 MG per tablet TAKE 1 TABLET EVERY DAY 90 tablet 1   No current facility-administered medications for this visit.     Physical Exam: BP 118/75 mmHg  Pulse 76  Resp 20  Ht 5' (1.524 m)  Wt 230 lb (104.327 kg)  BMI 44.92 kg/m2  SpO2 98%  LMP 02/12/2011 She looks well.  There is no cervical or supraclavicular adenopathy.  Lung exam is clear.  Cardiac exam shows a regular rate and rhythm with normal heart sounds.  The right chest thoracotomy scar is well-healed. There are no  skin lesions.   Diagnostic Tests:  CLINICAL DATA: History of carcinoid tumor resection from the right lung in 2012.  EXAM: CHEST 2 VIEW  COMPARISON: 10/19/2013  FINDINGS: The cardiac silhouette, mediastinal and hilar contours are within normal limits and stable. Stable surgical changes involving the right lung. No findings for recurrent mass or metastatic pulmonary disease. No acute pulmonary findings. No pleural effusion. The bony thorax is intact.  IMPRESSION: Stable postoperative changes involving the right lung. No acute pulmonary findings.   Electronically Signed  By: Kalman Jewels M.D.  On: 12/13/2014 12:08   Impression:  There is no sign of recurrent carcinoid. I have recommended that I see her back every 6 months with a CXR until she is 5 year out from surgery and then she can be followed yearly.  Plan:  She will return to see me in 6 months with a CXR.

## 2014-12-29 ENCOUNTER — Encounter: Payer: Self-pay | Admitting: Obstetrics & Gynecology

## 2014-12-29 ENCOUNTER — Ambulatory Visit (INDEPENDENT_AMBULATORY_CARE_PROVIDER_SITE_OTHER): Admitting: Obstetrics & Gynecology

## 2014-12-29 VITALS — BP 126/78 | HR 60 | Resp 16 | Ht 59.5 in | Wt 228.4 lb

## 2014-12-29 DIAGNOSIS — Z0000 Encounter for general adult medical examination without abnormal findings: Secondary | ICD-10-CM

## 2014-12-29 DIAGNOSIS — Z124 Encounter for screening for malignant neoplasm of cervix: Secondary | ICD-10-CM

## 2014-12-29 DIAGNOSIS — Z01419 Encounter for gynecological examination (general) (routine) without abnormal findings: Secondary | ICD-10-CM

## 2014-12-29 DIAGNOSIS — L292 Pruritus vulvae: Secondary | ICD-10-CM

## 2014-12-29 LAB — POCT URINALYSIS DIPSTICK
Bilirubin, UA: NEGATIVE
GLUCOSE UA: NEGATIVE
Ketones, UA: NEGATIVE
Nitrite, UA: NEGATIVE
Protein, UA: NEGATIVE
Urobilinogen, UA: NEGATIVE
pH, UA: 5

## 2014-12-29 MED ORDER — TRIAMCINOLONE ACETONIDE 0.5 % EX OINT: 1.0000 "application " | TOPICAL_OINTMENT | Freq: Two times a day (BID) | CUTANEOUS | Status: DC

## 2014-12-29 NOTE — Progress Notes (Signed)
59 y.o. G3P3 MarriedCaucasianF here for annual exam.  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    No vaginal bleeding.   Patient's last menstrual period was 02/12/2011.          Sexually active: Yes.    The current method of family planning is post menopausal status.    Exercising: Yes.    water, low impact aerobics and walking Smoker:  no  Health Maintenance: Pap:  05/19/12 WNL/negative HR HPV History of abnormal Pap:  no MMG:  06/26/14 3D-normal Colonoscopy:  2006.  Due this year. BMD:   none TDaP:  2010 Screening Labs: PCP, Hb today: PCP, Urine today: WBC-1+, RBC-1+   reports that she has never smoked. She has never used smokeless tobacco. She reports that she drinks about 1.2 oz of alcohol per week. She reports that she does not use illicit drugs.  Past Medical History  Diagnosis Date  . Hypertension   . Arthritis   . Carcinoid tumor of lung 2012    right found incidentally on chest xray eval for r/o vasxculitis   . 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 HNP (herniated nucleus pulposus), cervical 01/22/2011    C6-C7  Has seen dr Vertell Limber for this and Dr Tonita Cong  Positional numbness in hands no weakness    . Hypothyroidism   . Endometrial cells on cervical Pap smear inconsistent w/LMP 6/11    benign proliferative endometrium on endometrial biopsy  . SUI (stress urinary incontinence, female)     h/o  . Dysrhythmia     irregular occassional  . Double vision     Past Surgical History  Procedure Laterality Date  . Cesarean section    . Shoulder open rotator cuff repair  07/20/2010    Right  . Lung tumor removed      Rt lung carcinoid  . Dilatation & currettage/hysteroscopy with resectocope N/A 07/18/2013    Procedure: DILATATION & CURETTAGE/HYSTEROSCOPY WITH RESECTOCOPE;  Surgeon: Lyman Speller, MD;  Location: [facility];  Service: Gynecology;  Laterality: N/A;  mass resection    Current Outpatient Prescriptions  Medication Sig Dispense Refill  . amLODipine  (NORVASC) 5 MG tablet Take 1 tablet (5 mg total) by mouth daily. 90 tablet 1  . aspirin 81 MG tablet Take 81 mg by mouth daily.    [name] CALCIUM PO Take by mouth 2 (two) times daily.    . clobetasol cream (TEMOVATE) 0.05 % as needed.     . methocarbamol (ROBAXIN) 750 MG tablet 1-2 po tid  PO for muscle spasm 60 tablet 1  . metoprolol succinate (TOPROL-XL) 100 MG 24 hr tablet Take 1 tablet (100 mg total) by mouth daily. Take with or immediately following a meal. 90 tablet 1  . Multiple Vitamins-Minerals (EYE VITAMINS PO) Take 1 capsule by mouth 2 (two) times daily. "Eye caps"    . Multiple Vitamins-Minerals (MULTIVITAMIN PO) Take 1 tablet by mouth daily.     . Omega-3 Fatty Acids (FISH OIL PO) Take 1 capsule by mouth daily.     [name] SYNTHROID 137 MCG tablet TAKE 1 TABLET BY MOUTH EVERY DAY 90 tablet 1  . triamterene-hydrochlorothiazide (MAXZIDE-25) 37.5-25 MG per tablet TAKE 1 TABLET EVERY DAY 90 tablet 1   No current facility-administered medications for this visit.    Family History  Problem Relation Age of Onset  . Stroke Mother     died age 65  . Hypertension Mother   . Coronary artery disease Father  died age 56  . Hypertension Maternal Uncle   . Parkinson's disease Brother   . Diabetes Brother   . Hypertension Father   . Hypertension Brother     x2    ROS:  Pertinent items are noted in HPI.  Otherwise, a comprehensive ROS was negative.  Exam:   BP 126/78 mmHg  Pulse 60  Resp 16  Ht 4' 11.5" (1.511 m)  Wt 228 lb 6.4 oz (103.602 kg)  BMI 45.38 kg/m2  LMP 02/12/2011     Height: 4' 11.5" (151.1 cm)  Ht Readings from Last 3 Encounters:  12/29/14 4' 11.5" (1.511 m)  12/13/14 5' (1.524 m)  09/11/14 5' (1.524 m)   General appearance: alert, cooperative and appears stated age Head: Normocephalic, without obvious abnormality, atraumatic Neck: no adenopathy, supple, symmetrical, trachea midline and thyroid normal to inspection and palpation Lungs: clear to auscultation  bilaterally Breasts: normal appearance, no masses or tenderness Heart: regular rate and rhythm Abdomen: soft, non-tender; bowel sounds normal; no masses,  no organomegaly Extremities: extremities normal, atraumatic, no cyanosis or edema Skin: Skin color, texture, turgor normal. No rashes or lesions Lymph nodes: Cervical, supraclavicular, and axillary nodes normal. No abnormal inguinal nodes palpated Neurologic: Grossly normal   Pelvic: External genitalia:  no lesions              Urethra:  normal appearing urethra with no masses, tenderness or lesions              Bartholins and Skenes: normal                 Vagina: normal appearing vagina with normal color and discharge, no lesions              Cervix: no lesions              Pap taken: Yes.   Bimanual Exam:  Uterus:  normal size, contour, position, consistency, mobility, non-tender              Adnexa: normal adnexa and no mass, fullness, tenderness               Rectovaginal: Confirms               Anus:  normal sphincter tone, no lesions  Chaperone was present for exam.  A:  Well Woman with normal exam Obesity Hypothyroidism H/O SUI H/O carcinoid tumor s/p lobectomy 7/12 OSA Polycythemia Vera.  Being followed by Dr. Alvy Bimler Vulvar itching  P: Mammogram yealry Pap 7/14 with endometrial cells.  No Pap today. H/o hysteroscopy with resection of myoma 8/14 Changes in topical products such as toilet papers, detergents, softeners, minipads discussed.  Do not think vulvar biopsy is indicated today due to No abnormal findings but may need in the future if recommendations do not help. Trail of triamcinolone ointment 0.5% up to BID x 7 days, or less.  Rx to pharmacy.  Pt knows I want her to call if these recommendations do not help as I would proceed with vulvar biopsy at that time. F/U 1 year or prn

## 2014-12-29 NOTE — Patient Instructions (Signed)
No Angel Soft or Charmin No Always products Bar soap No fabric softener with undergarments Using small amount of triamcinolone ointment with itching episodes

## 2014-12-30 LAB — WET PREP BY MOLECULAR PROBE
Candida species: NEGATIVE
Gardnerella vaginalis: NEGATIVE
Trichomonas vaginosis: NEGATIVE

## 2015-01-01 ENCOUNTER — Telehealth: Payer: Self-pay

## 2015-01-01 LAB — IPS PAP TEST WITH REFLEX TO HPV

## 2015-01-01 NOTE — Telephone Encounter (Signed)
Patient notified see result note 

## 2015-01-01 NOTE — Telephone Encounter (Signed)
Lmtcb//kn 

## 2015-01-01 NOTE — Telephone Encounter (Signed)
-----   Message from Lyman Speller, MD sent at 01/01/2015  7:41 AM EST ----- Inform pt this is negative.  No yeast present.  I do want her to proceed with the suggestions I made in the office and use of the steroid cream.  If these don't make a big difference, I do want her to please let me know.

## 2015-01-16 ENCOUNTER — Encounter: Payer: Self-pay | Admitting: Internal Medicine

## 2015-01-16 ENCOUNTER — Ambulatory Visit (INDEPENDENT_AMBULATORY_CARE_PROVIDER_SITE_OTHER): Admitting: Internal Medicine

## 2015-01-16 VITALS — BP 136/78 | Temp 98.2°F | Ht 59.5 in | Wt 224.1 lb

## 2015-01-16 DIAGNOSIS — J019 Acute sinusitis, unspecified: Secondary | ICD-10-CM

## 2015-01-16 DIAGNOSIS — R053 Chronic cough: Secondary | ICD-10-CM

## 2015-01-16 DIAGNOSIS — R05 Cough: Secondary | ICD-10-CM

## 2015-01-16 DIAGNOSIS — J069 Acute upper respiratory infection, unspecified: Secondary | ICD-10-CM

## 2015-01-16 MED ORDER — HYDROCODONE-HOMATROPINE 5-1.5 MG/5ML PO SYRP: 5.0000 mL | ORAL_SOLUTION | ORAL | Status: DC | PRN

## 2015-01-16 MED ORDER — AMOXICILLIN-POT CLAVULANATE 875-125 MG PO TABS: 1.0000 | ORAL_TABLET | Freq: Two times a day (BID) | ORAL | Status: DC

## 2015-01-16 NOTE — Progress Notes (Signed)
Pre visit review using our clinic review tool, if applicable. No additional management support is needed unless otherwise documented below in the visit note.  Chief Complaint  Patient presents with  . Cough    [id number]    HPI: Patient Nichole Smith  comes in today for SDA for  new problem evaluation. Onset like a cold 5 weeks ago and then cough  Had routine cxray jan 13 and was ok . Since then continue to cough worse gyne heard wheezing .  In the past few days has had increasing nasal congesion right teeth tingling and  Inc cough had chills last night no fever no sob or sb.   ROS: See pertinent positives and negatives per HPI.  Past Medical History  Diagnosis Date  . Hypertension   . Arthritis   . Carcinoid tumor of lung 2012    right found incidentally on chest xray eval for r/o vasxculitis   . 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 HNP (herniated nucleus pulposus), cervical 01/22/2011    C6-C7  Has seen dr Vertell Limber for this and Dr Tonita Cong  Positional numbness in hands no weakness    . Hypothyroidism   . Endometrial cells on cervical Pap smear inconsistent w/LMP 6/11    benign proliferative endometrium on endometrial biopsy  . SUI (stress urinary incontinence, female)     h/o  . Dysrhythmia     irregular occassional  . Double vision     Family History  Problem Relation Age of Onset  . Stroke Mother     died age 64  . Hypertension Mother   . Coronary artery disease Father     died age 50  . Hypertension Maternal Uncle   . Parkinson's disease Brother   . Diabetes Brother   . Hypertension Father   . Hypertension Brother     x2    History   Social History  . Marital Status: Married    Spouse Name: N/A  . Number of Children: 3  . Years of Education: N/A   Occupational History  . retied    Social History Main Topics  . Smoking status: Never Smoker   . Smokeless tobacco: Never Used  . Alcohol Use: 1.2 oz/week    2 Standard drinks or equivalent per week  . Drug  Use: No  . Sexual Activity:    Partners: Male    Birth Control/ Protection: None   Other Topics Concern  . None   Social History Narrative   hhof 2    2 Children at college and  beyond      [name] full time   Married    Retired age 78 2015    Outpatient Encounter Prescriptions as of 01/16/2015  Medication Sig  . amLODipine (NORVASC) 5 MG tablet Take 1 tablet (5 mg total) by mouth daily.  [name] aspirin 81 MG tablet Take 81 mg by mouth daily.  [name] CALCIUM PO Take by mouth 2 (two) times daily.  . clobetasol cream (TEMOVATE) 0.05 % as needed.   . methocarbamol (ROBAXIN) 750 MG tablet 1-2 po tid  PO for muscle spasm  . metoprolol succinate (TOPROL-XL) 100 MG 24 hr tablet Take 1 tablet (100 mg total) by mouth daily. Take with or immediately following a meal.  . Multiple Vitamins-Minerals (EYE VITAMINS PO) Take 1 capsule by mouth 2 (two) times daily. "Eye caps"  . Multiple Vitamins-Minerals (MULTIVITAMIN PO) Take 1 tablet by mouth daily.   . Omega-3 Fatty Acids (FISH OIL  PO) Take 1 capsule by mouth daily.   [name] SYNTHROID 137 MCG tablet TAKE 1 TABLET BY MOUTH EVERY DAY  . triamcinolone ointment (KENALOG) 0.5 % Apply 1 application topically 2 (two) times daily.  [name] triamterene-hydrochlorothiazide (MAXZIDE-25) 37.5-25 MG per tablet TAKE 1 TABLET EVERY DAY  . amoxicillin-clavulanate (AUGMENTIN) 875-125 MG per tablet Take 1 tablet by mouth 2 (two) times daily.  [name] HYDROcodone-homatropine (HYCODAN) 5-1.5 MG/5ML syrup Take 5 mLs by mouth every 4 (four) hours as needed for cough. 1-2 tsp    EXAM:  BP 136/78 mmHg  Temp(Src) 98.2 F (36.8 C) (Oral)  Ht 4' 11.5" (1.511 m)  Wt 224 lb 1.6 oz (101.651 kg)  BMI 44.52 kg/m2  SpO2 97%  LMP 02/12/2011  Body mass index is 44.52 kg/(m^2). WDWN in NAD  quiet respirations; very  congested  somewhat hoarse. Non toxic . HEENT: Normocephalic ;atraumatic , Eyes;  PERRL, EOMs  Full, lids and conjunctiva clear,,Ears: no deformities, canals nl, TM landmarks  normal right fulshed dec lgiht reflex , Nose: no deformity or discharge but very congested;face minimally tender Mouth : OP red 1+ no without lesion or edema . Neck: Supple without adenopathy or masses or bruitstender right ac node  Chest:  Clear to A&P without wheezes rales or rhonchi CV:  S1-S2 no gallops or murmurs peripheral perfusion is normal Skin :nl perfusion and no acute rashes  ASSESSMENT AND PLAN:  Discussed the following assessment and plan:  Protracted URI  Acute sinusitis with symptoms greater than 10 days - new sx after prolonged sx.   Cough, persistent Cough med for comfort also .   Expectant management.   -Patient advised to return or notify health care team  if symptoms worsen ,persist or new concerns arise.  Patient Instructions  Acts like sinus  Infection secondary infection. Your leung exam is good today. Expect improvement in the next 3-5 days of not contact us . considier ation of prednisone if wheezing.   Standley Brooking. Denora Wysocki M.D.

## 2015-01-16 NOTE — Patient Instructions (Signed)
Acts like sinus  Infection secondary infection. Your leung exam is good today. Expect improvement in the next 3-5 days of not contact us . considier ation of prednisone if wheezing.

## 2015-02-16 ENCOUNTER — Other Ambulatory Visit: Payer: Self-pay | Admitting: Family Medicine

## 2015-02-16 ENCOUNTER — Encounter: Payer: Self-pay | Admitting: Internal Medicine

## 2015-02-16 MED ORDER — HYDROCODONE-HOMATROPINE 5-1.5 MG/5ML PO SYRP: 5.0000 mL | ORAL_SOLUTION | ORAL | Status: DC | PRN

## 2015-02-18 ENCOUNTER — Encounter: Payer: Self-pay | Admitting: Internal Medicine

## 2015-02-19 ENCOUNTER — Other Ambulatory Visit (INDEPENDENT_AMBULATORY_CARE_PROVIDER_SITE_OTHER)

## 2015-02-19 DIAGNOSIS — Z0000 Encounter for general adult medical examination without abnormal findings: Secondary | ICD-10-CM | POA: Diagnosis not present

## 2015-02-19 LAB — HEPATIC FUNCTION PANEL
ALK PHOS: 61 U/L (ref 39–117)
ALT: 21 U/L (ref 0–35)
AST: 22 U/L (ref 0–37)
Albumin: 3.5 g/dL (ref 3.5–5.2)
BILIRUBIN DIRECT: 0.1 mg/dL (ref 0.0–0.3)
Total Bilirubin: 0.5 mg/dL (ref 0.2–1.2)
Total Protein: 6.9 g/dL (ref 6.0–8.3)

## 2015-02-19 LAB — BASIC METABOLIC PANEL
BUN: 16 mg/dL (ref 6–23)
CO2: 31 mEq/L (ref 19–32)
Calcium: 9 mg/dL (ref 8.4–10.5)
Chloride: 100 mEq/L (ref 96–112)
Creatinine, Ser: 0.74 mg/dL (ref 0.40–1.20)
GFR: 84.95 mL/min (ref >60.00)
Glucose, Bld: 101 mg/dL — ABNORMAL HIGH (ref 70–99)
Potassium: 3.8 mEq/L (ref 3.5–5.1)
Sodium: 137 mEq/L (ref 135–145)

## 2015-02-19 LAB — CBC WITH DIFFERENTIAL/PLATELET
Basophils Absolute: 0 10*3/uL (ref 0.0–0.1)
Basophils Relative: 0.4 % (ref 0.0–3.0)
Eosinophils Absolute: 0.3 10*3/uL (ref 0.0–0.7)
Eosinophils Relative: 3 % (ref 0.0–5.0)
HCT: 44.4 % (ref 36.0–46.0)
Hemoglobin: 15.2 g/dL — ABNORMAL HIGH (ref 12.0–15.0)
Lymphocytes Relative: 27.4 % (ref 12.0–46.0)
Lymphs Abs: 2.4 10*3/uL (ref 0.7–4.0)
MCHC: 34.2 g/dL (ref 30.0–36.0)
MCV: 88.4 fl (ref 78.0–100.0)
Monocytes Absolute: 0.6 10*3/uL (ref 0.1–1.0)
Monocytes Relative: 7.1 % (ref 3.0–12.0)
Neutro Abs: 5.4 10*3/uL (ref 1.4–7.7)
Neutrophils Relative %: 62.1 % (ref 43.0–77.0)
Platelets: 258 10*3/uL (ref 150.0–400.0)
RBC: 5.03 Mil/uL (ref 3.87–5.11)
RDW: 12.9 % (ref 11.5–15.5)
WBC: 8.7 10*3/uL (ref 4.0–10.5)

## 2015-02-19 LAB — LIPID PANEL
Cholesterol: 147 mg/dL (ref 0–200)
HDL: 44.7 mg/dL (ref >39.00)
LDL Cholesterol: 82 mg/dL (ref 0–99)
NonHDL: 102.3
Total CHOL/HDL Ratio: 3
Triglycerides: 104 mg/dL (ref 0.0–149.0)
VLDL: 20.8 mg/dL (ref 0.0–40.0)

## 2015-02-19 LAB — TSH: TSH: 2.17 u[IU]/mL (ref 0.35–4.50)

## 2015-02-19 MED ORDER — AMLODIPINE BESYLATE 5 MG PO TABS: 5.0000 mg | ORAL_TABLET | Freq: Every day | ORAL | Status: DC

## 2015-02-19 NOTE — Telephone Encounter (Signed)
Sent to the pharmacy by e-scribe. 

## 2015-02-26 ENCOUNTER — Ambulatory Visit (INDEPENDENT_AMBULATORY_CARE_PROVIDER_SITE_OTHER): Admitting: Internal Medicine

## 2015-02-26 ENCOUNTER — Encounter: Payer: Self-pay | Admitting: Internal Medicine

## 2015-02-26 VITALS — BP 136/80 | Temp 98.3°F | Ht 59.25 in | Wt 225.0 lb

## 2015-02-26 DIAGNOSIS — Z79899 Other long term (current) drug therapy: Secondary | ICD-10-CM

## 2015-02-26 DIAGNOSIS — G4733 Obstructive sleep apnea (adult) (pediatric): Secondary | ICD-10-CM

## 2015-02-26 DIAGNOSIS — R05 Cough: Secondary | ICD-10-CM | POA: Diagnosis not present

## 2015-02-26 DIAGNOSIS — D582 Other hemoglobinopathies: Secondary | ICD-10-CM

## 2015-02-26 DIAGNOSIS — Z418 Encounter for other procedures for purposes other than remedying health state: Secondary | ICD-10-CM

## 2015-02-26 DIAGNOSIS — I10 Essential (primary) hypertension: Secondary | ICD-10-CM | POA: Diagnosis not present

## 2015-02-26 DIAGNOSIS — Z299 Encounter for prophylactic measures, unspecified: Secondary | ICD-10-CM

## 2015-02-26 DIAGNOSIS — E063 Autoimmune thyroiditis: Secondary | ICD-10-CM

## 2015-02-26 DIAGNOSIS — E039 Hypothyroidism, unspecified: Secondary | ICD-10-CM

## 2015-02-26 DIAGNOSIS — R053 Chronic cough: Secondary | ICD-10-CM

## 2015-02-26 MED ORDER — PREDNISONE 20 MG PO TABS: ORAL_TABLET | ORAL | Status: DC

## 2015-02-26 NOTE — Patient Instructions (Signed)
Trial prednisone for 5 days  For cogh continue flonase Continue other meds .  See pulmonary if cough not resolving. ROV  Yearly with labs and fu if all ok  Check on last colonoscopy and when due

## 2015-02-26 NOTE — Progress Notes (Signed)
Pre visit review using our clinic review tool, if applicable. No additional management support is needed unless otherwise documented below in the visit note.  Chief Complaint  Patient presents with  . Follow-up    preventive   . Hypertension  . Hypothyroidism    HPI: Patient  Nichole Smith  59 y.o. comes in today for yearly visit and problem based  Dr Sabra Heck did PV gyne check .  HT: taking med no se controlled Thyroid no change med. OSA under care evaluation cpap   Cough continues after sinus infection rx last month  Bette but tickle drainage type cough allergic  Cough without wheeze worse with laying down  Cough med helps at night no fever or illness at this time started flonase for a week or so     Health Maintenance  Topic Date Due  . HIV Screening  01/30/2016 (Originally 08/27/1971)  . INFLUENZA VACCINE  07/02/2015  . MAMMOGRAM  06/26/2016  . COLONOSCOPY  08/23/2017  . PAP SMEAR  12/29/2017  . TETANUS/TDAP  01/31/2024  . ZOSTAVAX  Completed    ROS:  GEN/ HEENT: No fever, significant weight changes sweats headaches vision problems hearing changes, CV/ PULM; No chest pain shortness of breath  syncope,edema  change in exercise tolerance. GI /GU: No adominal pain, vomiting, change in bowel habits. No blood in the stool. No significant GU symptoms. SKIN/HEME: ,no acute skin rashes suspicious lesions or bleeding. No lymphadenopathy, nodules, masses.  NEURO/ PSYCH:  No neurologic signs such as weakness numbness. No depression anxiety. IMM/ Allergy: No unusual infections.  Allergy .   REST of 12 system review negative except as per HPI   Past Medical History  Diagnosis Date  . Hypertension   . Arthritis   . Carcinoid tumor of lung 2012    right found incidentally on chest xray eval for r/o vasxculitis   . 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 HNP (herniated nucleus pulposus), cervical 01/22/2011    C6-C7  Has seen dr Vertell Limber for this and Dr Tonita Cong  Positional numbness  in hands no weakness    . Hypothyroidism   . Endometrial cells on cervical Pap smear inconsistent w/LMP 6/11    benign proliferative endometrium on endometrial biopsy  . SUI (stress urinary incontinence, female)     h/o  . Dysrhythmia     irregular occassional  . Double vision     Past Surgical History  Procedure Laterality Date  . Cesarean section    . Shoulder open rotator cuff repair  07/20/2010    Right  . Lung tumor removed      Rt lung carcinoid  . Dilatation & currettage/hysteroscopy with resectocope N/A 07/18/2013    Procedure: DILATATION & CURETTAGE/HYSTEROSCOPY WITH RESECTOCOPE;  Surgeon: Lyman Speller, MD;  Location: [facility];  Service: Gynecology;  Laterality: N/A;  mass resection    Family History  Problem Relation Age of Onset  . Stroke Mother     died age 26  . Hypertension Mother   . Coronary artery disease Father     died age 43  . Hypertension Maternal Uncle   . Parkinson's disease Brother   . Diabetes Brother   . Hypertension Father   . Hypertension Brother     x2    History   Social History  . Marital Status: Married    Spouse Name: N/A  . Number of Children: 3  . Years of Education: N/A   Occupational History  . retied  Social History Main Topics  . Smoking status: Never Smoker   . Smokeless tobacco: Never Used  . Alcohol Use: 1.2 oz/week    2 Standard drinks or equivalent per week  . Drug Use: No  . Sexual Activity:    Partners: Male    Birth Control/ Protection: None   Other Topics Concern  . None   Social History Narrative   hhof 2    2 Children at college and  beyond      [name] full time   Married    Retired age 64 2015    Outpatient Encounter Prescriptions as of 02/26/2015  Medication Sig  . amLODipine (NORVASC) 5 MG tablet Take 1 tablet (5 mg total) by mouth daily.  [name] aspirin 81 MG tablet Take 81 mg by mouth daily.  [name] CALCIUM PO Take by mouth 2 (two) times daily.  . clobetasol cream (TEMOVATE) 0.05 % as  needed.   [name] HYDROcodone-homatropine (HYCODAN) 5-1.5 MG/5ML syrup Take 5 mLs by mouth every 4 (four) hours as needed for cough. 1-2 tsp  . methocarbamol (ROBAXIN) 750 MG tablet 1-2 po tid  PO for muscle spasm  . metoprolol succinate (TOPROL-XL) 100 MG 24 hr tablet Take 1 tablet (100 mg total) by mouth daily. Take with or immediately following a meal.  . Multiple Vitamins-Minerals (EYE VITAMINS PO) Take 1 capsule by mouth 2 (two) times daily. "Eye caps"  . Multiple Vitamins-Minerals (MULTIVITAMIN PO) Take 1 tablet by mouth daily.   . Omega-3 Fatty Acids (FISH OIL PO) Take 1 capsule by mouth daily.   [name] SYNTHROID 137 MCG tablet TAKE 1 TABLET BY MOUTH EVERY DAY  . triamcinolone ointment (KENALOG) 0.5 % Apply 1 application topically 2 (two) times daily.  [name] triamterene-hydrochlorothiazide (MAXZIDE-25) 37.5-25 MG per tablet TAKE 1 TABLET EVERY DAY  . predniSONE (DELTASONE) 20 MG tablet Take 3 po qd for 2 days then 2 po qd for 3 days,or as directed  . [DISCONTINUED] amoxicillin-clavulanate (AUGMENTIN) 875-125 MG per tablet Take 1 tablet by mouth 2 (two) times daily.    EXAM:  BP 136/80 mmHg  Temp(Src) 98.3 F (36.8 C) (Oral)  Ht 4' 11.25" (1.505 m)  Wt 225 lb (102.059 kg)  BMI 45.06 kg/m2  LMP 02/12/2011  Body mass index is 45.06 kg/(m^2).  Physical Exam: Vital signs reviewed TMH:DQQI is a well-developed well-nourished alert cooperative    who appearsr stated age in no acute distress.  HEENT: normocephalic atraumatic , Eyes: PERRL EOM's full, conjunctiva clear, Nares: paten,t no deformity discharge or tenderness., Ears: no deformity EAC's clear TMs with normal landmarks. Mouth: clear OP, no lesions, edema. Mild congestion  Moist mucous membranes. Dentition in adequate repair. NECK: supple without masses, thyromegaly or bruits. CHEST/PULM:  Clear to auscultation and percussion breath sounds equal no wheeze , rales or rhonchi. No chest wall deformities or tenderness. CV: PMI is nondisplaced, S1  S2 no gallops, murmurs, rubs. Peripheral pulses are full without delay.No JVD .  ABDOMEN: Bowel sounds normal nontender  No guard or rebound, no hepato splenomegal no CVA tenderness.  . Extremtities:  No clubbing cyanosis or edema, no acute joint swelling or redness no focal atrophy NEURO:  Oriented x3, cranial nerves 3-12 appear to be intact, no obvious focal weakness,gait within normal limits no abnormal reflexes or asymmetrical SKIN: No acute rashes normal turgor, color, no bruising or petechiae. PSYCH: Oriented, good eye contact, no obvious depression anxiety, cognition and judgment appear normal. LN: no cervical axillary l adenopathy  Lab  Results  Component Value Date   WBC 8.7 02/19/2015   HGB 15.2* 02/19/2015   HCT 44.4 02/19/2015   PLT 258.0 02/19/2015   GLUCOSE 101* 02/19/2015   CHOL 147 02/19/2015   TRIG 104.0 02/19/2015   HDL 44.70 02/19/2015   LDLCALC 82 02/19/2015   ALT 21 02/19/2015   AST 22 02/19/2015   NA 137 02/19/2015   K 3.8 02/19/2015   CL 100 02/19/2015   CREATININE 0.74 02/19/2015   BUN 16 02/19/2015   CO2 31 02/19/2015   TSH 2.17 02/19/2015   INR 0.96 06/11/2011   BP Readings from Last 3 Encounters:  02/26/15 136/80  01/16/15 136/78  12/29/14 126/78   Wt Readings from Last 3 Encounters:  02/26/15 225 lb (102.059 kg)  01/16/15 224 lb 1.6 oz (101.651 kg)  12/29/14 228 lb 6.4 oz (103.602 kg)    ASSESSMENT AND PLAN:  Discussed the following assessment and plan:  Cough, persistent - post infectious  poss vs drainage   empiric pred  course ask pulm to opine cont flonase  Essential hypertension  Elevated hemoglobin  Obstructive sleep apnea  Hypothyroidism, unspecified hypothyroidism type  Thyroiditis, autoimmune  Medication management  Preventive measure  Patient Care Team: Burnis Medin, MD as PCP - General Jari Pigg, MD (Dermatology) Megan Salon, MD as Attending Physician (Gynecology) Lafayette Dragon, MD (Gastroenterology) Gaye Pollack, MD (Cardiothoracic Surgery) Earlie Server, MD as Attending Physician (Orthopedic Surgery) Patient Instructions  Trial prednisone for 5 days  For cogh continue flonase Continue other meds .  See pulmonary if cough not resolving. ROV  Yearly with labs and fu if all ok  Check on last colonoscopy and when due     Grabiela Wohlford K. Abigaelle Verley M.D.

## 2015-03-04 ENCOUNTER — Encounter: Payer: Self-pay | Admitting: Pulmonary Disease

## 2015-03-05 NOTE — Telephone Encounter (Addendum)
4.3.16 mychart message from pt: Message     I seem to remember Dr. Gwenette Greet wanting me to have an overnight oxygen study before my next visit with him. I see him in a couple of weeks. If I need the study, please order it. Thank you so much.     Pt last seen 10.12.15 by [facility]: Patient Instructions     Will adjust your pressure to the auto setting to better treat your sleep apnea. Let me know if you are not comfortable on this setting. Work on weight loss Please call us in 4 weeks so that we can order an oxygen check overnight on your cpap device.  followup with me again in 1mos.    Pt is scheduled to see [place] on 4.12.16 but ONO was never ordered and pt never called back as recommended by [facility] to have this ordered Dr Gwenette Greet, please advise if you would still like pt to have ONO done prior to OV or would like to see pt first.  Thanks.

## 2015-03-06 NOTE — Telephone Encounter (Signed)
We put her on a new pressure, and she was to call us after 4 weeks to make sure she was doing well before we did ONO.  Obviously she never called [name]. Ok to order ONO ON CPAP as long as she feels she is doing well with the pressure change from the last visit.

## 2015-03-13 ENCOUNTER — Encounter: Payer: Self-pay | Admitting: Pulmonary Disease

## 2015-03-13 ENCOUNTER — Ambulatory Visit (INDEPENDENT_AMBULATORY_CARE_PROVIDER_SITE_OTHER): Admitting: Pulmonary Disease

## 2015-03-13 VITALS — BP 114/68 | HR 62 | Temp 97.0°F | Ht 59.25 in | Wt 223.4 lb

## 2015-03-13 DIAGNOSIS — G4733 Obstructive sleep apnea (adult) (pediatric): Secondary | ICD-10-CM

## 2015-03-13 NOTE — Progress Notes (Signed)
   Subjective:    Patient ID: Nichole Smith, female    DOB: [birth year], 59 y.o.   MRN: [id number]  HPI Patient comes in today for follow-up of her obstructive sleep apnea. Her download shows excellent compliance, and very good control of her AHI with no significant mask leaks. She feels that she is doing well, and is satisfied with her daytime alertness. She still needs to have overnight oximetry on C in light of her history of polycythemia.   Review of Systems  Constitutional: Negative for fever and unexpected weight change.  HENT: Negative for congestion, dental problem, ear pain, nosebleeds, postnasal drip, rhinorrhea, sinus pressure, sneezing, sore throat and trouble swallowing.   Eyes: Negative for redness and itching.  Respiratory: Negative for cough, chest tightness, shortness of breath and wheezing.   Cardiovascular: Negative for palpitations and leg swelling.  Gastrointestinal: Negative for nausea and vomiting.  Genitourinary: Negative for dysuria.  Musculoskeletal: Negative for joint swelling.  Skin: Negative for rash.  Neurological: Negative for headaches.  Hematological: Does not bruise/bleed easily.  Psychiatric/Behavioral: Negative for dysphoric mood. The patient is not nervous/anxious.        Objective:   Physical Exam Overweight female in no acute distress Nose without purulence or discharge noted No skin breakdown or pressure necrosis from the C Pap mask Neck without lymphadenopathy or thyromegaly Lower extremities with mild edema, no cyanosis Alert and oriented, moves all 4 extremities.       Assessment & Plan:

## 2015-03-13 NOTE — Assessment & Plan Note (Signed)
The patient continues to do very well on her C Pap device, and her current download shows great compliance and excellent control of her AHI. She is having no significant mask leak. The patient feels that she is sleeping well on the device, and is satisfied with her daytime alertness. I have asked her to keep up with her mask changes and supplies, and to work aggressively on weight loss.

## 2015-03-13 NOTE — Patient Instructions (Signed)
Will make sure you get your oxygen level check on therapeutic cpap. Work on weight reduction. followup in one year if you are doing well.

## 2015-03-19 ENCOUNTER — Telehealth: Payer: Self-pay | Admitting: Pulmonary Disease

## 2015-03-19 ENCOUNTER — Encounter: Payer: Self-pay | Admitting: Internal Medicine

## 2015-03-19 ENCOUNTER — Other Ambulatory Visit: Payer: Self-pay | Admitting: Internal Medicine

## 2015-03-19 MED ORDER — TRIAMTERENE-HCTZ 37.5-25 MG PO TABS: ORAL_TABLET | ORAL | Status: DC

## 2015-03-19 MED ORDER — METOPROLOL SUCCINATE ER 100 MG PO TB24
100.0000 mg | ORAL_TABLET | Freq: Every day | ORAL | Status: DC
Start: 2015-03-19 — End: 2015-09-10

## 2015-03-19 NOTE — Telephone Encounter (Signed)
Please let pt know that her oxygen level did drop to 79% transiently, but only spent 2 min the entire night less than 88%.  Does not need oxygen with her cpap.

## 2015-03-20 ENCOUNTER — Other Ambulatory Visit: Payer: Self-pay | Admitting: Internal Medicine

## 2015-03-20 NOTE — Telephone Encounter (Signed)
I spoke with patient about results and she verbalized understanding and had no questions 

## 2015-03-21 NOTE — Telephone Encounter (Signed)
Sent to the pharmacy by e-scribe. 

## 2015-04-11 ENCOUNTER — Ambulatory Visit: Admitting: Emergency Medicine

## 2015-04-16 ENCOUNTER — Other Ambulatory Visit: Payer: Self-pay | Admitting: Internal Medicine

## 2015-04-17 MED ORDER — AMLODIPINE BESYLATE 5 MG PO TABS: 5.0000 mg | ORAL_TABLET | Freq: Every day | ORAL | Status: DC

## 2015-04-17 NOTE — Addendum Note (Signed)
Addended by: Santiago Bumpers on: 04/17/2015 11:41 AM   Modules accepted: Orders

## 2015-04-26 ENCOUNTER — Encounter: Payer: Self-pay | Admitting: Pulmonary Disease

## 2015-05-23 ENCOUNTER — Encounter: Payer: Self-pay | Admitting: Internal Medicine

## 2015-05-27 ENCOUNTER — Ambulatory Visit (INDEPENDENT_AMBULATORY_CARE_PROVIDER_SITE_OTHER): Admitting: Family Medicine

## 2015-05-27 VITALS — BP 118/74 | HR 82 | Temp 98.2°F | Resp 18 | Ht 60.0 in | Wt 227.4 lb

## 2015-05-27 DIAGNOSIS — R059 Cough, unspecified: Secondary | ICD-10-CM

## 2015-05-27 DIAGNOSIS — Z2089 Contact with and (suspected) exposure to other communicable diseases: Secondary | ICD-10-CM

## 2015-05-27 DIAGNOSIS — Z20818 Contact with and (suspected) exposure to other bacterial communicable diseases: Secondary | ICD-10-CM

## 2015-05-27 DIAGNOSIS — J020 Streptococcal pharyngitis: Secondary | ICD-10-CM

## 2015-05-27 DIAGNOSIS — J029 Acute pharyngitis, unspecified: Secondary | ICD-10-CM

## 2015-05-27 DIAGNOSIS — R05 Cough: Secondary | ICD-10-CM

## 2015-05-27 LAB — POCT RAPID STREP A (OFFICE): RAPID STREP A SCREEN: POSITIVE — AB

## 2015-05-27 MED ORDER — HYDROCODONE-HOMATROPINE 5-1.5 MG/5ML PO SYRP: ORAL_SOLUTION | ORAL | Status: DC

## 2015-05-27 MED ORDER — AMOXICILLIN 500 MG PO CAPS: 500.0000 mg | ORAL_CAPSULE | Freq: Three times a day (TID) | ORAL | Status: DC

## 2015-05-27 NOTE — Patient Instructions (Signed)
Start amoxicillin - 10 days, tylenol or motrin if needed for pain and fever, sore throat lozenges as needed.  Return to the clinic or go to the nearest emergency room if any of your symptoms worsen or new symptoms occur.  Strep Throat Strep throat is an infection of the throat caused by a bacteria named Streptococcus pyogenes. Your health care provider may call the infection streptococcal "tonsillitis" or "pharyngitis" depending on whether there are signs of inflammation in the tonsils or back of the throat. Strep throat is most common in children aged 5-15 years during the cold months of the year, but it can occur in people of any age during any season. This infection is spread from person to person (contagious) through coughing, sneezing, or other close contact. SIGNS AND SYMPTOMS   Fever or chills.  Painful, swollen, red tonsils or throat.  Pain or difficulty when swallowing.  White or yellow spots on the tonsils or throat.  Swollen, tender lymph nodes or "glands" of the neck or under the jaw.  Red rash all over the body (rare). DIAGNOSIS  Many different infections can cause the same symptoms. A test must be done to confirm the diagnosis so the right treatment can be given. A "rapid strep test" can help your health care provider make the diagnosis in a few minutes. If this test is not available, a light swab of the infected area can be used for a throat culture test. If a throat culture test is done, results are usually available in a day or two. TREATMENT  Strep throat is treated with antibiotic medicine. HOME CARE INSTRUCTIONS   Gargle with 1 tsp of salt in 1 cup of warm water, 3-4 times per day or as needed for comfort.  Family members who also have a sore throat or fever should be tested for strep throat and treated with antibiotics if they have the strep infection.  Make sure everyone in your household washes their hands well.  Do not share food, drinking cups, or personal items  that could cause the infection to spread to others.  You may need to eat a soft food diet until your sore throat gets better.  Drink enough water and fluids to keep your urine clear or pale yellow. This will help prevent dehydration.  Get plenty of rest.  Stay home from school, day care, or work until you have been on antibiotics for 24 hours.  Take medicines only as directed by your health care provider.  Take your antibiotic medicine as directed by your health care provider. Finish it even if you start to feel better. SEEK MEDICAL CARE IF:   The glands in your neck continue to enlarge.  You develop a rash, cough, or earache.  You cough up green, yellow-brown, or bloody sputum.  You have pain or discomfort not controlled by medicines.  Your problems seem to be getting worse rather than better.  You have a fever. SEEK IMMEDIATE MEDICAL CARE IF:   You develop any new symptoms such as vomiting, severe headache, stiff or painful neck, chest pain, shortness of breath, or trouble swallowing.  You develop severe throat pain, drooling, or changes in your voice.  You develop swelling of the neck, or the skin on the neck becomes red and tender.  You develop signs of dehydration, such as fatigue, dry mouth, and decreased urination.  You become increasingly sleepy, or you cannot wake up completely. MAKE SURE YOU:  Understand these instructions.  Will watch your condition.  Will get help right away if you are not doing well or get worse. Document Released: 11/14/2000 Document Revised: 04/03/2014 Document Reviewed: 01/16/2011 [facility] Patient Information 2015 [place], [place]. This information is not intended to replace advice given to you by your health care provider. Make sure you discuss any questions you have with your health care provider.

## 2015-05-27 NOTE — Progress Notes (Addendum)
Subjective:  This chart was scribed for Nichole Ray, MD by [facility], medical scribe at [facility].The patient was seen in exam room 04 and the patient's care was started at 9:29 AM.  Authored by Janeann Forehand, MD - unable to change in [facility].    Patient ID: Nichole Smith, female    DOB: 1956/11/19, 59 y.o.   MRN: [id number] Chief Complaint  Patient presents with   Sore Throat    x1 day, was around someone with strep throat last week   Cough   Headache   HPI HPI Comments: Nichole Smith is a 58 y.o. female who presents to [facility] complaining of a sore throat onset yesterday morning. She has an irritated cough, chest congestion, and a headache as associated symptoms. Pt has had trouble sleeping because of her cough, and pain with swallowing because of her sore throat. Pt has tried tylenol for relief, which has been helpful. Pt has sick contacts, her daughter at strep throat. She denies fever.  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   HNP (herniated nucleus pulposus), cervical 01/22/2011   Visit for preventive health examination 01/22/2011   PAIN IN JOINT, MULTIPLE SITES 01/21/2010   NECK AND BACK PAIN 07/06/2009   Hypothyroidism 10/16/2008   OBESITY, UNSPECIFIED 01/07/2008   PALPITATIONS, RECURRENT 01/07/2008   Essential hypertension  06/25/2007   OSTEOARTHRITIS 06/25/2007   Past Medical History  Diagnosis Date   Hypertension    Arthritis    Carcinoid tumor of lung 2012    right found incidentally on chest xray eval for r/o vasxculitis    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   HNP (herniated nucleus pulposus), cervical 01/22/2011    C6-C7  Has seen dr Vertell Limber for this and Dr Tonita Cong  Positional numbness in hands no weakness     Hypothyroidism    Endometrial cells on cervical Pap smear inconsistent w/LMP 6/11    benign proliferative endometrium on endometrial biopsy   SUI (stress urinary incontinence, female)     h/o   Dysrhythmia     irregular occassional   Double vision    Past Surgical History  Procedure Laterality Date   Cesarean section     Shoulder open rotator cuff repair  07/20/2010    Right   Lung tumor removed      Rt lung carcinoid   Dilatation & currettage/hysteroscopy with resectocope N/A 07/18/2013    Procedure: DILATATION & CURETTAGE/HYSTEROSCOPY WITH RESECTOCOPE;  Surgeon: Lyman Speller, MD;  Location: [facility];  Service: Gynecology;  Laterality: N/A;  mass resection   Allergies  Allergen Reactions   Neomycin     REACTION: Inflammation   Tramadol Other (See Comments)    dizzness   Ace Inhibitors Cough   Prior to Admission medications   Medication Sig Start Date End Date Taking? Authorizing Provider  amLODipine (NORVASC) 5 MG tablet Take 1 tablet (5 mg total)  by mouth daily. 04/17/15  Yes Burnis Medin, MD  aspirin 81 MG tablet Take 81 mg by mouth daily.   Yes Historical Provider, MD  CALCIUM PO Take by mouth 2 (two) times daily.   Yes Historical Provider, MD  metoprolol succinate (TOPROL-XL) 100 MG 24 hr tablet Take 1 tablet (100 mg total) by mouth daily. Take with or immediately following a meal. 03/19/15  Yes Burnis Medin, MD  Multiple Vitamins-Minerals (EYE VITAMINS PO) Take 1 capsule by mouth 2 (two) times daily. "Eye caps"   Yes Historical Provider, MD  Multiple  Vitamins-Minerals (MULTIVITAMIN PO) Take 1 tablet by mouth daily.    Yes Historical Provider, MD  Omega-3 Fatty Acids (FISH OIL PO) Take 1 capsule by mouth daily.    Yes Historical Provider, MD  SYNTHROID 137 MCG tablet TAKE 1 TABLET DAILY 03/21/15  Yes Burnis Medin, MD  triamterene-hydrochlorothiazide (MAXZIDE-25) 37.5-25 MG per tablet TAKE 1 TABLET EVERY DAY 03/19/15  Yes Burnis Medin, MD  clobetasol cream (TEMOVATE) 0.05 % as needed.  07/21/13   Historical Provider, MD  methocarbamol (ROBAXIN) 750 MG tablet 1-2 po tid  PO for muscle spasm Patient not taking: Reported on 05/27/2015 06/20/14   Burnis Medin, MD  triamcinolone ointment (KENALOG) 0.5 % Apply 1 application topically 2 (two) times daily. Patient not taking: Reported on 05/27/2015 12/29/14   Megan Salon, MD   History   Social History   Marital Status: Married    Spouse Name: N/A   Number of Children: 3   Years of Education: N/A   Occupational History   retied    Social History Main Topics   Smoking status: Never Smoker    Smokeless tobacco: Never Used   Alcohol Use: 1.2 oz/week    2 Standard drinks or equivalent per week   Drug Use: No   Sexual Activity:    Partners: Male    Museum/gallery curator: None   Other Topics Concern   Not on file   Social History Narrative   hhof 2    2 Children at college and  beyond      [employer] full time   Married    Retired age 58 2015   Review of Systems  Constitutional: Negative for fever.  HENT: Positive for congestion and sore throat.   Respiratory: Positive for cough.   Neurological: Positive for headaches.      Objective:  BP 118/74 mmHg   Pulse 82   Temp(Src) 98.2 F (36.8 C) (Oral)   Resp 18   Ht 5' (1.524 m)   Wt 227 lb 6.4 oz (103.148 kg)   BMI 44.41 kg/m2   SpO2 97%   LMP 02/12/2011 Physical Exam  Constitutional: She is oriented to person, place, and time. She appears well-developed and well-nourished. No distress.  HENT:  Head:  Normocephalic and atraumatic.  Right Ear: Hearing, tympanic membrane, external ear and ear canal normal.  Left Ear: Hearing, tympanic membrane, external ear and ear canal normal.  Nose: Nose normal.  Mouth/Throat: Oropharynx is clear and moist. No oropharyngeal exudate.  Erythema of the posterior oropharynx with cobble stoning. There is a very small amount white, yellow adherent on the both tonsils right greater than left.  Eyes: Conjunctivae and EOM are normal. Pupils are equal, round, and reactive to light.  Neck: Normal range of motion.  Tender enlarged AC lymph node on the left.   Cardiovascular: Normal rate, regular rhythm, normal heart sounds and intact  distal pulses.   No murmur heard. Pulmonary/Chest: Effort normal and breath sounds normal. No respiratory distress. She has no wheezes. She has no rhonchi.  Musculoskeletal: Normal range of motion.  Neurological: She is alert and oriented to person, place, and time.  Skin: Skin is warm and dry. No rash noted.  Psychiatric: She has a normal mood and affect. Her behavior is normal.  Nursing note and vitals reviewed.  Results for orders placed or performed in visit on 05/27/15  POCT rapid strep A  Result Value Ref Range   Rapid Strep A Screen Positive (A) Negative       Assessment & Plan:   Nichole Smith is a 59 y.o. female Sore throat - Plan: POCT rapid strep A  Exposure to strep throat - Plan: POCT rapid strep A  Cough - Plan: HYDROcodone-homatropine (HYCODAN) 5-1.5 MG/5ML syrup  Strep pharyngitis - Plan: amoxicillin (AMOXIL) 500 MG capsule  Start amoxicillin '500mg'$  tid, sx care discussed and hycodan if needed for cough - SED. RTC precautions.   Meds ordered this encounter  Medications   amoxicillin (AMOXIL) 500 MG capsule    Sig: Take 1 capsule (500 mg total) by mouth 3 (three) times daily.    Dispense:  30 capsule    Refill:  0   HYDROcodone-homatropine (HYCODAN) 5-1.5 MG/5ML syrup    Sig: 13mby mouth a bedtime  as needed for cough.    Dispense:  120 mL    Refill:  0   Patient Instructions  Start amoxicillin - 10 days, tylenol or motrin if needed for pain and fever, sore throat lozenges as needed.  Return to the clinic or go to the nearest emergency room if any of your symptoms worsen or new symptoms occur.  Strep Throat Strep throat is an infection of the throat caused by a bacteria named Streptococcus pyogenes. Your health care provider may call the infection streptococcal "tonsillitis" or "pharyngitis" depending on whether there are signs of inflammation in the tonsils or back of the throat. Strep throat is most common in children aged 5-15 years during the cold months of the year, but it can occur in people of any age during any season. This infection is spread from person to person (contagious) through coughing, sneezing, or other close contact. SIGNS AND SYMPTOMS   Fever or chills.  Painful, swollen, red tonsils or throat.  Pain or difficulty when swallowing.  White or yellow spots on the tonsils or throat.  Swollen, tender lymph nodes or "glands" of the neck or under the jaw.  Red rash all over the body (rare). DIAGNOSIS  Many different infections can cause the same symptoms. A test must be done to confirm the diagnosis so the right treatment can be given. A "rapid strep test" can help your health care provider make the diagnosis in a few minutes. If this test is not available, a light swab of the infected area can be used for a throat culture test. If a throat culture test is done, results are usually available in a day or two. TREATMENT  Strep throat is treated with antibiotic medicine. HOME CARE INSTRUCTIONS   Gargle with 1 tsp of salt in 1 cup of warm water, 3-4 times per day or as needed for comfort.  Family members who also have a sore throat or fever should be tested for strep throat and treated with antibiotics if they have the strep infection.  Make sure everyone in your  household washes their hands well.  Do not share food, drinking cups, or personal items that could cause the infection to spread to others.  You may need to eat a soft food diet until your sore throat gets better.  Drink enough water and fluids to keep your urine clear or pale yellow. This will help prevent dehydration.  Get plenty of rest.  Stay home from school, day care, or work until you have been on antibiotics for 24 hours.  Take medicines only as directed by your health care provider.  Take your antibiotic medicine as directed by your health care provider. Finish it even if you start to feel better. SEEK MEDICAL CARE IF:   The glands in your neck continue to enlarge.  You develop a rash, cough, or earache.  You cough up green, yellow-brown, or bloody sputum.  You have pain or discomfort not controlled by medicines.  Your problems seem to be getting worse rather than better.  You have a fever. SEEK IMMEDIATE MEDICAL CARE IF:   You develop any new symptoms such as vomiting, severe headache, stiff or painful neck, chest pain, shortness of breath, or trouble swallowing.  You develop severe throat pain, drooling, or changes in your voice.  You develop swelling of the neck, or the skin on the neck becomes red and tender.  You develop signs of dehydration, such as fatigue, dry mouth, and decreased urination.  You become increasingly sleepy, or you cannot wake up completely. MAKE SURE YOU:  Understand these instructions.  Will watch your condition.  Will get help right away if you are not doing well or get worse. Document Released: 11/14/2000 Document Revised: 04/03/2014 Document Reviewed: 01/16/2011 [facility] Patient Information 2015 [place], [place]. This information is not intended to replace advice given to you by your health care provider. Make sure you discuss any questions you have with your health care provider.     I personally performed the services  described in this documentation, which was scribed in my presence. The recorded information has been reviewed and considered, and addended by me as needed.

## 2015-06-18 ENCOUNTER — Other Ambulatory Visit: Payer: Self-pay | Admitting: Surgery

## 2015-06-18 DIAGNOSIS — D1430 Benign neoplasm of unspecified bronchus and lung: Secondary | ICD-10-CM

## 2015-06-20 ENCOUNTER — Ambulatory Visit (INDEPENDENT_AMBULATORY_CARE_PROVIDER_SITE_OTHER): Admitting: Surgery

## 2015-06-20 ENCOUNTER — Ambulatory Visit
Admission: RE | Admit: 2015-06-20 | Discharge: 2015-06-20 | Disposition: A | Discharge status: Home / Self Care | Source: Ambulatory Visit | Attending: Surgery | Admitting: Surgery

## 2015-06-20 ENCOUNTER — Encounter: Payer: Self-pay | Admitting: Surgery

## 2015-06-20 VITALS — BP 135/80 | HR 67 | Resp 20 | Ht 60.0 in | Wt 227.0 lb

## 2015-06-20 DIAGNOSIS — D1430 Benign neoplasm of unspecified bronchus and lung: Secondary | ICD-10-CM

## 2015-06-20 DIAGNOSIS — Z9889 Other specified postprocedural states: Secondary | ICD-10-CM | POA: Diagnosis not present

## 2015-06-20 DIAGNOSIS — D1431 Benign neoplasm of right bronchus and lung: Secondary | ICD-10-CM

## 2015-06-20 DIAGNOSIS — Z902 Acquired absence of lung [part of]: Secondary | ICD-10-CM

## 2015-06-20 NOTE — Progress Notes (Signed)
      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 Since I last saw her in January 2016 she notes a cough that is non-productive that feels like she has something in her throat. She thinks it may be allergies or her sinuses. No sputum or hemoptysis. She denies any chest pain.  Current Outpatient Prescriptions  Medication Sig Dispense Refill  . amLODipine (NORVASC) 5 MG tablet Take 1 tablet (5 mg total) by mouth daily. 90 tablet 3  . aspirin 81 MG tablet Take 81 mg by mouth daily.    [name] CALCIUM PO Take by mouth 2 (two) times daily.    . clobetasol cream (TEMOVATE) 0.05 % as needed.     [name] HYDROcodone-homatropine (HYCODAN) 5-1.5 MG/5ML syrup 32mby mouth a bedtime as needed for cough. 120 mL 0  . methocarbamol (ROBAXIN) 750 MG tablet 1-2 po tid  PO for muscle spasm 60 tablet 1  . metoprolol succinate (TOPROL-XL) 100 MG 24 hr tablet Take 1 tablet (100 mg total) by mouth daily. Take with or immediately following a meal. 90 tablet 1  . Multiple Vitamins-Minerals (EYE VITAMINS PO) Take 1 capsule by mouth 2 (two) times daily. "Eye caps"    . Multiple Vitamins-Minerals (MULTIVITAMIN PO) Take 1 tablet by mouth daily.     . Omega-3 Fatty Acids (FISH OIL PO) Take 1 capsule by mouth daily.     .Marland KitchenSYNTHROID 137 MCG tablet TAKE 1 TABLET DAILY 90 tablet 3  . triamcinolone ointment (KENALOG) 0.5 % Apply 1 application topically 2 (two) times daily. 30 g 1  . triamterene-hydrochlorothiazide (MAXZIDE-25) 37.5-25 MG per tablet TAKE 1 TABLET EVERY DAY 90 tablet 1   No current facility-administered medications for this visit.     Physical Exam:  BP 135/80 mmHg  Pulse 67  Resp 20  Ht 5' (1.524 m)  Wt 227 lb (102.967 kg)  BMI 44.33 kg/m2  SpO2 97%  LMP 02/12/2011 She looks well.  There is no cervical or supraclavicular adenopathy.  Lung exam is clear.  Cardiac exam shows a regular rate and rhythm with  normal heart sounds.  The right chest thoracotomy scar is well-healed. There are no skin lesions.   Diagnostic Tests:  CLINICAL DATA: Carcinoid tumor.  EXAM: CHEST 2 VIEW  COMPARISON: None.  FINDINGS: Mediastinum hilar structures normal. Surgical clips right hilum. Lungs are clear. No pleural effusion or pneumothorax. Stable mild cardiomegaly. No pulmonary venous congestion. Degenerative changes thoracic spine.  IMPRESSION: No acute cardiopulmonary disease. Postsurgical changes right lung.   Electronically Signed  By: TMarcello MooresRegister  On: 06/20/2015 12:13   Impression:  There is no sign of recurrent carcinoid. I have recommended that I see her back yearly with a CXR. I don't see any cause for her cough on CXR.   Plan:  She will return to see me in one year with a CXR. She is going to follow up with Dr. PRegis Billconcerning her non-productive cough.     BGaye Pollack MD [facility] ([phone]

## 2015-06-26 ENCOUNTER — Encounter: Payer: Self-pay | Admitting: Gastroenterology

## 2015-06-29 ENCOUNTER — Telehealth: Payer: Self-pay | Admitting: Internal Medicine

## 2015-06-29 NOTE — Telephone Encounter (Signed)
Spoke to patient. She is not having the pain right now, she does want to have appointment with Dr. Regis Bill only on her next available day. Patient did not feel she needed to be seen by any other doctor today. She scheduled appointment for Monday.

## 2015-06-29 NOTE — Telephone Encounter (Signed)
Please have Morey Hummingbird follow-up with patient to get appointment scheduled

## 2015-06-29 NOTE — Telephone Encounter (Signed)
Will send to triage

## 2015-06-29 NOTE — Telephone Encounter (Signed)
Patient Name: Nichole Smith  DOB: October 26, 1956    Initial Comment caller states she has abdominal pain   Nurse Assessment  Nurse: Raphael Gibney, RN, Vanita Ingles Date/Time (Eastern Time): 06/29/2015 8:14:00 AM  Confirm and document reason for call. If symptomatic, describe symptoms. ---Caller states she is having abd pain. Pain is not severe. Has a chronic cough. Pain is all over but is worse in her upper abd. Has had pain 10 days. No fever, diarrhea or vomiting. Pain is 3. Pain comes and goes.  Has the patient traveled out of the country within the last 30 days? ---No  Does the patient require triage? ---Yes  Related visit to physician within the last 2 weeks? ---No  Does the PT have any chronic conditions? (i.e. diabetes, asthma, etc.) ---Yes  List chronic conditions. ---prediabetes     Guidelines    Guideline Title Affirmed Question Affirmed Notes  Abdominal Pain - Female [1] MILD (e.g., does not interfere with normal activities) AND [2] pain comes and goes (cramps) AND [3] present > 48 hours    Final Disposition User   See Physician within 24 Hours Stringer, RN, [place] states the office has told her that Dr. Regis Bill has not appts today and does not want to see anyone else. Advised caller that someone from the office will call her back today. Verbalized understanding.   Referrals  GO TO FACILITY REFUSED   Disagree/Comply: Comply

## 2015-07-02 ENCOUNTER — Encounter: Payer: Self-pay | Admitting: Internal Medicine

## 2015-07-02 ENCOUNTER — Ambulatory Visit (INDEPENDENT_AMBULATORY_CARE_PROVIDER_SITE_OTHER): Admitting: Internal Medicine

## 2015-07-02 VITALS — BP 140/80 | Temp 98.2°F | Wt 224.5 lb

## 2015-07-02 DIAGNOSIS — R1013 Epigastric pain: Secondary | ICD-10-CM | POA: Diagnosis not present

## 2015-07-02 DIAGNOSIS — R059 Cough, unspecified: Secondary | ICD-10-CM

## 2015-07-02 DIAGNOSIS — R053 Chronic cough: Secondary | ICD-10-CM

## 2015-07-02 DIAGNOSIS — R05 Cough: Secondary | ICD-10-CM

## 2015-07-02 DIAGNOSIS — R058 Other specified cough: Secondary | ICD-10-CM

## 2015-07-02 LAB — POCT URINALYSIS DIP (MANUAL ENTRY)
BILIRUBIN UA: NEGATIVE
Bilirubin, UA: NEGATIVE
GLUCOSE UA: NEGATIVE
LEUKOCYTES UA: NEGATIVE
NITRITE UA: NEGATIVE
Protein Ur, POC: NEGATIVE
RBC UA: NEGATIVE
Spec Grav, UA: 1.01
Urobilinogen, UA: 0.2
pH, UA: 6.5

## 2015-07-02 MED ORDER — HYDROCODONE-HOMATROPINE 5-1.5 MG/5ML PO SYRP: ORAL_SOLUTION | ORAL | Status: DC

## 2015-07-02 NOTE — Progress Notes (Signed)
Pre visit review using our clinic review tool, if applicable. No additional management support is needed unless otherwise documented below in the visit note.  Chief Complaint  Patient presents with  . Abdominal Discomfort    X2wks. Better over the weekend.    HPI: Patient Nichole Smith  comes in today for SDA for  new problem evaluation.  Had prob for 10   Days  Began mid chest and then moving down.  hig epigastric   Acid reflux .   And took otc calmed down x 2 days  And maloxx and then mid section achiness and nausea.      Took lstool softener.    No change in bowel habits  . Currently her pain is better she is eating normally she is on a diabetes prevention program with diet and exercise drinking lots of water. She still has persistent upper chest cough has seen the chest surgeon x-ray okay no other reason for the cough told to see PCP. States it is worse at night is taking cough medicine. Denies specific nocturnal heartburn. No shortness of breath otherwise has some slight drainage feeling. She is taking nasal cortisone's.  ROS: See pertinent positives and negatives per HPI. No utis sx but had some lower abd sx at tsome point  Frequency poss from inc water  Intake but  Ok now.  No fever chills   Past Medical History  Diagnosis Date  . Hypertension   . Arthritis   . Carcinoid tumor of lung 2012    right found incidentally on chest xray eval for r/o vasxculitis   . 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 HNP (herniated nucleus pulposus), cervical 01/22/2011    C6-C7  Has seen dr Vertell Limber for this and Dr Tonita Cong  Positional numbness in hands no weakness    . Hypothyroidism   . Endometrial cells on cervical Pap smear inconsistent w/LMP 6/11    benign proliferative endometrium on endometrial biopsy  . SUI (stress urinary incontinence, female)     h/o  . Dysrhythmia     irregular occassional  . Double vision     Family History  Problem Relation Age of Onset  . Stroke Mother    died age 71  . Hypertension Mother   . Coronary artery disease Father     died age 48  . Hypertension Father   . Hypertension Maternal Uncle   . Parkinson's disease Brother   . Diabetes Brother   . Hypertension Brother     x2    History   Social History  . Marital Status: Married    Spouse Name: N/A  . Number of Children: 3  . Years of Education: N/A   Occupational History  . retied    Social History Main Topics  . Smoking status: Never Smoker   . Smokeless tobacco: Never Used  . Alcohol Use: 1.2 oz/week    2 Standard drinks or equivalent per week  . Drug Use: No  . Sexual Activity:    Partners: Male    Birth Control/ Protection: None   Other Topics Concern  . None   Social History Narrative   hhof 2    2 Children at college and  beyond      [name] full time   Married    Retired age 53 2015    Outpatient Prescriptions Prior to Visit  Medication Sig Dispense Refill  . amLODipine (NORVASC) 5 MG tablet Take 1 tablet (5 mg total) by mouth  daily. 90 tablet 3  . aspirin 81 MG tablet Take 81 mg by mouth daily.    [name] CALCIUM PO Take by mouth 2 (two) times daily.    . clobetasol cream (TEMOVATE) 0.05 % as needed.     . methocarbamol (ROBAXIN) 750 MG tablet 1-2 po tid  PO for muscle spasm 60 tablet 1  . metoprolol succinate (TOPROL-XL) 100 MG 24 hr tablet Take 1 tablet (100 mg total) by mouth daily. Take with or immediately following a meal. 90 tablet 1  . Multiple Vitamins-Minerals (EYE VITAMINS PO) Take 1 capsule by mouth 2 (two) times daily. "Eye caps"    . Multiple Vitamins-Minerals (MULTIVITAMIN PO) Take 1 tablet by mouth daily.     . Omega-3 Fatty Acids (FISH OIL PO) Take 1 capsule by mouth daily.     [name] SYNTHROID 137 MCG tablet TAKE 1 TABLET DAILY 90 tablet 3  . triamcinolone ointment (KENALOG) 0.5 % Apply 1 application topically 2 (two) times daily. 30 g 1  . triamterene-hydrochlorothiazide (MAXZIDE-25) 37.5-25 MG per tablet TAKE 1 TABLET EVERY DAY 90  tablet 1  . HYDROcodone-homatropine (HYCODAN) 5-1.5 MG/5ML syrup 63mby mouth a bedtime as needed for cough. 120 mL 0   No facility-administered medications prior to visit.     EXAM:  BP 140/80 mmHg  Temp(Src) 98.2 F (36.8 C) (Oral)  Wt 224 lb 8 oz (101.833 kg)  LMP 02/12/2011  Body mass index is 43.84 kg/(m^2).  GENERAL: vitals reviewed and listed above, alert, oriented, appears well hydrated and in no acute distress HEENT: atraumatic, conjunctiva  clear, no obvious abnormalities on inspection of external nose and ears OP : no lesion edema or exudate  NECK: no obvious masses on inspection palpation  LUNGS: clear to auscultation bilaterally, no wheezes, rales or rhonchi, good air movement CV: HRRR, no clubbing cyanosis or  peripheral edema nl cap refill  Abdomen:  Sof,t normal bowel sounds without hepatosplenomegaly, no guarding rebound or masses no CVA tenderness but points to epigastrica rea as area of previoous concer  MS: moves all extremities without noticeable focal  abnormality PSYCH: pleasant and cooperative, no obvious depression or anxiety BP Readings from Last 3 Encounters:  07/02/15 140/80  06/20/15 135/80  05/27/15 118/74   Wt Readings from Last 3 Encounters:  07/02/15 224 lb 8 oz (101.833 kg)  06/20/15 227 lb (102.967 kg)  05/27/15 227 lb 6.4 oz (103.148 kg)   Lab Results  Component Value Date   WBC 8.7 02/19/2015   HGB 15.2* 02/19/2015   HCT 44.4 02/19/2015   PLT 258.0 02/19/2015   GLUCOSE 101* 02/19/2015   CHOL 147 02/19/2015   TRIG 104.0 02/19/2015   HDL 44.70 02/19/2015   LDLCALC 82 02/19/2015   ALT 21 02/19/2015   AST 22 02/19/2015   NA 137 02/19/2015   K 3.8 02/19/2015   CL 100 02/19/2015   CREATININE 0.74 02/19/2015   BUN 16 02/19/2015   CO2 31 02/19/2015   TSH 2.17 02/19/2015   INR 0.96 06/11/2011    ua nl ASSESSMENT AND PLAN:  Discussed the following assessment and plan:  Epigastric pain - Plan: POCT urinalysis dipstick  Cough,  persistent - sounds reflux plus uairriatbility syndrome   Cough - Plan: HYDROcodone-homatropine (HYCODAN) 5-1.5 MG/5ML syrup  Upper airway cough syndrome ? Cpap?  Suspect there could be acid reflux causing some of her cough and upper irritable airway. In addition could be the cause of her abdominal pain. See patient instructions. Plan follow-up in  about 6 weeks. Or as needed Agree with her continuing with the diabetes prevention program at [place] over the next year. Weight loss can help these underlying aggravators.  Risk benefit of medication discussed. -Patient advised to return or notify health care team  if symptoms worsen ,persist or new concerns arise.  Patient Instructions  Your cough and abdominal pain could be related to acid reflux. You could also have something called irritable airway. Okay to use cough medicine if needed we are going to add an acid blocker for the next 4-6 weeks.  Avoid airway irritants such as oils and  Mints and menthols  In th interim Continue healthy eating weight loss efforts. 5-10 pounds weight loss usually can help acid reflux. It is also possible you could have a gallstone or gallbladder disease but usually that gets worse pain after eating a fatty meal.   Add omeprazole 20 mg   (otc ok)every day 30 minutes before a meal  For poss gastritis  Acid related sx .   Plan follow-up visit in about 6 weeks or as needed.  Standley Brooking. Panosh M.D.

## 2015-07-02 NOTE — Patient Instructions (Addendum)
Your cough and abdominal pain could be related to acid reflux. You could also have something called irritable airway. Okay to use cough medicine if needed we are going to add an acid blocker for the next 4-6 weeks.  Avoid airway irritants such as oils and  Mints and menthols  In th interim Continue healthy eating weight loss efforts. 5-10 pounds weight loss usually can help acid reflux. It is also possible you could have a gallstone or gallbladder disease but usually that gets worse pain after eating a fatty meal.   Add omeprazole 20 mg   (otc ok)every day 30 minutes before a meal  For poss gastritis  Acid related sx .   Plan follow-up visit in about 6 weeks or as needed.  [name]

## 2015-07-03 LAB — HM MAMMOGRAPHY

## 2015-07-05 ENCOUNTER — Encounter: Payer: Self-pay | Admitting: Family Medicine

## 2015-08-04 ENCOUNTER — Ambulatory Visit (INDEPENDENT_AMBULATORY_CARE_PROVIDER_SITE_OTHER): Admitting: Physician Assistant

## 2015-08-04 VITALS — BP 130/76 | HR 77 | Temp 98.3°F | Resp 18 | Ht 59.75 in | Wt 221.0 lb

## 2015-08-04 DIAGNOSIS — S01112A Laceration without foreign body of left eyelid and periocular area, initial encounter: Secondary | ICD-10-CM

## 2015-08-04 NOTE — Patient Instructions (Signed)

## 2015-08-08 NOTE — Progress Notes (Signed)
[facility] [address] [place] [address] [phone]  Date:  08/04/2015   Name:  Nichole Smith   DOB:  02/15/1956   MRN:  [id number]  PCP:  [name], MD    History of Present Illness:  Nichole Smith is a 59 y.o. female patient who presents to [facility] for chief complaint of laceration along eye brow.  20 minutes ago, she missed a step, walking to fill her birdfeeder, and hit her head on a round table.  No loc, headache, or dizziness.  Bleeding steady at first but trailed off with ice.  No cleaning yet.  No pain with eye movement or vision change.  Tetanus utd.  She was wearing her glasses.    Patient Active Problem List   Diagnosis Date Noted  . 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 Obstructive sleep apnea 07/27/2014  . Midline low back pain without sciatica 06/20/2014  . Polycythemia, secondary 03/22/2014  . Severe obesity (BMI >= 40) 02/22/2014  . Elevated hemoglobin 02/22/2014  . Weight gain 02/22/2014  . Retrocalcaneal bursitis 10/04/2013  . Peroneal tendinitis of right lower extremity 10/04/2013  . Achilles tendinitis of right lower extremity 08/08/2013  . NSAID long-term use 02/04/2013  . Left shoulder pain 02/04/2013  . Plantar fasciitis of left foot 02/02/2013  . 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 HNP (herniated nucleus pulposus), cervical 01/22/2011  . Visit for preventive health examination 01/22/2011  . PAIN IN JOINT, MULTIPLE SITES 01/21/2010  . NECK AND BACK PAIN 07/06/2009  . Hypothyroidism 10/16/2008  . OBESITY, UNSPECIFIED 01/07/2008  . PALPITATIONS, RECURRENT 01/07/2008  . Essential hypertension 06/25/2007  . OSTEOARTHRITIS 06/25/2007    Past Medical History  Diagnosis Date  . Hypertension   . Arthritis   . Carcinoid tumor of lung 2012    right found incidentally on chest xray eval for r/o vasxculitis   . 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 HNP (herniated nucleus pulposus), cervical 01/22/2011    C6-C7   Has seen dr Vertell Limber for this and Dr Tonita Cong  Positional numbness in hands no weakness    . Hypothyroidism   . Endometrial cells on cervical Pap smear inconsistent w/LMP 6/11    benign proliferative endometrium on endometrial biopsy  . SUI (stress urinary incontinence, female)     h/o  . Dysrhythmia     irregular occassional  . Smith vision     Past Surgical History  Procedure Laterality Date  . Cesarean section    . Shoulder open rotator cuff repair  07/20/2010    Right  . Lung tumor removed      Rt lung carcinoid  . Dilatation & currettage/hysteroscopy with resectocope N/A 07/18/2013    Procedure: DILATATION & CURETTAGE/HYSTEROSCOPY WITH RESECTOCOPE;  Surgeon: Lyman Speller, MD;  Location: [facility];  Service: Gynecology;  Laterality: N/A;  mass resection    Social History  Substance Use Topics  . Smoking status: Never Smoker   . Smokeless tobacco: Never Used  . Alcohol Use: 1.2 oz/week    2 Standard drinks or equivalent per week    Family History  Problem Relation Age of Onset  . Stroke Mother     died age 9  . Hypertension Mother   . Coronary artery disease Father     died age 26  . Hypertension Father   . Hypertension Maternal Uncle   . Parkinson's disease Brother   . Diabetes Brother   . Hypertension Brother  x2    Allergies  Allergen Reactions  . Neomycin     REACTION: Inflammation  . Tramadol Other (See Comments)    dizzness  . Ace Inhibitors Cough    Medication list has been reviewed and updated.  Current Outpatient Prescriptions on File Prior to Visit  Medication Sig Dispense Refill  . amLODipine (NORVASC) 5 MG tablet Take 1 tablet (5 mg total) by mouth daily. 90 tablet 3  . aspirin 81 MG tablet Take 81 mg by mouth daily.    [name] CALCIUM PO Take by mouth 2 (two) times daily.    . clobetasol cream (TEMOVATE) 0.05 % as needed.     [name] HYDROcodone-homatropine (HYCODAN) 5-1.5 MG/5ML syrup 38mby mouth a bedtime as needed for cough. 120 mL 0  .  methocarbamol (ROBAXIN) 750 MG tablet 1-2 po tid  PO for muscle spasm 60 tablet 1  . metoprolol succinate (TOPROL-XL) 100 MG 24 hr tablet Take 1 tablet (100 mg total) by mouth daily. Take with or immediately following a meal. 90 tablet 1  . Multiple Vitamins-Minerals (EYE VITAMINS PO) Take 1 capsule by mouth 2 (two) times daily. "Eye caps"    . Multiple Vitamins-Minerals (MULTIVITAMIN PO) Take 1 tablet by mouth daily.     . Omega-3 Fatty Acids (FISH OIL PO) Take 1 capsule by mouth daily.     .Marland KitchenSYNTHROID 137 MCG tablet TAKE 1 TABLET DAILY 90 tablet 3  . triamcinolone ointment (KENALOG) 0.5 % Apply 1 application topically 2 (two) times daily. 30 g 1  . triamterene-hydrochlorothiazide (MAXZIDE-25) 37.5-25 MG per tablet TAKE 1 TABLET EVERY DAY 90 tablet 1   No current facility-administered medications on file prior to visit.    ROS ROS otherwise unremarkable unless listed above.  Physical Examination: BP 130/76 mmHg  Pulse 77  Temp(Src) 98.3 F (36.8 C) (Oral)  Resp 18  Ht 4' 11.75" (1.518 m)  Wt 221 lb 0.6 oz (100.263 kg)  BMI 43.51 kg/m2  SpO2 99%  LMP 02/12/2011 Ideal Body Weight: Weight in (lb) to have BMI = 25: 126.7  Physical Exam  Constitutional: She is oriented to person, place, and time.  Eyes: Conjunctivae are normal. Pupils are equal, round, and reactive to light. Right conjunctiva is not injected. Left conjunctiva is not injected. Right eye exhibits normal extraocular motion and no nystagmus. Left eye exhibits normal extraocular motion and no nystagmus.  Neurological: She is alert and oriented to person, place, and time. No cranial nerve deficit.  Skin: Skin is warm and dry.  2cm laceration just beneath the left eyebrow.  No purulent drainage.  Mild swelling localized to the laceration.    Psychiatric: She has a normal mood and affect. Her speech is normal and behavior is normal.   Procedure: Verbal consent obtained. 1% Lidocaine at the wound site.  Cleansed with soap and  water.  6-0 ethilon used for simple interrupted sutures.  Cleansed with normal saline.  Dressing applied.  Assessment and Plan: 59year old female is here today with eyebrow laceration.  1. Laceration of eyelid, left, initial encounter rtc in 5 days.  SIvar Drape PA-C [facility] 08/08/2015 6:22 AM

## 2015-08-09 ENCOUNTER — Telehealth: Payer: Self-pay | Admitting: Internal Medicine

## 2015-08-09 ENCOUNTER — Ambulatory Visit (INDEPENDENT_AMBULATORY_CARE_PROVIDER_SITE_OTHER): Admitting: Urgent Care

## 2015-08-09 VITALS — BP 110/70 | HR 59 | Temp 97.9°F | Resp 18

## 2015-08-09 DIAGNOSIS — Z4802 Encounter for removal of sutures: Secondary | ICD-10-CM

## 2015-08-09 DIAGNOSIS — R05 Cough: Secondary | ICD-10-CM

## 2015-08-09 DIAGNOSIS — R059 Cough, unspecified: Secondary | ICD-10-CM

## 2015-08-09 DIAGNOSIS — S0181XD Laceration without foreign body of other part of head, subsequent encounter: Secondary | ICD-10-CM

## 2015-08-09 NOTE — Progress Notes (Signed)
   Patient: Nichole Smith [id number]  Subjective: [place] is returning for suture removal. Patient was initially seen 08/04/2015 and had 7 sutures placed over her left face near her eyebrow. Denies fever, drainage of pus or blood, wound dehiscence, edema, pain.   Objective: BP 110/70 mmHg  Pulse 59  Temp(Src) 97.9 F (36.6 C) (Oral)  Resp 18  LMP 02/12/2011  Physical Exam  Constitutional: She is oriented to person, place, and time and well-developed, well-nourished, and in no distress.  HENT:  Head:    Cardiovascular: Normal rate.   Pulmonary/Chest: Effort normal.  Neurological: She is alert and oriented to person, place, and time.  Skin: Skin is warm and dry. No rash noted. No erythema. No pallor.    #7 sutures removed without incident. One 1/2 inch steri-strip placed. Patient tolerated this well.  Assessment and Plan: Well-healed wound. Anticipatory guidance provided. Return to clinic as needed.  Jaynee Eagles, PA-C [facility] [phone] 08/09/2015  11:33 AM

## 2015-08-13 ENCOUNTER — Telehealth: Payer: Self-pay | Admitting: Internal Medicine

## 2015-08-13 NOTE — Telephone Encounter (Signed)
Spoke to pt, she's coming in 9.15.16 @ 2:45p.

## 2015-08-13 NOTE — Telephone Encounter (Signed)
Patient called requesting to be worked in for an appointment this week. She is having left foot and knee pain and is scheduled for a vacation next week where she will be doing a lot of walking. She states her schedule is flexible. CB# [phone]

## 2015-08-14 MED ORDER — HYDROCODONE-HOMATROPINE 5-1.5 MG/5ML PO SYRP: ORAL_SOLUTION | ORAL | Status: DC

## 2015-08-14 NOTE — Telephone Encounter (Signed)
done

## 2015-08-14 NOTE — Addendum Note (Signed)
Addended by: Alysia Penna A on: 08/14/2015 09:27 AM   Modules accepted: Orders

## 2015-08-14 NOTE — Telephone Encounter (Signed)
Pt notified to pick up at the front desk. 

## 2015-08-16 ENCOUNTER — Other Ambulatory Visit (INDEPENDENT_AMBULATORY_CARE_PROVIDER_SITE_OTHER)

## 2015-08-16 ENCOUNTER — Encounter: Payer: Self-pay | Admitting: Family Medicine

## 2015-08-16 ENCOUNTER — Ambulatory Visit (INDEPENDENT_AMBULATORY_CARE_PROVIDER_SITE_OTHER): Admitting: Family Medicine

## 2015-08-16 VITALS — BP 126/72 | HR 66 | Ht 59.75 in | Wt 221.0 lb

## 2015-08-16 DIAGNOSIS — M25562 Pain in left knee: Secondary | ICD-10-CM

## 2015-08-16 DIAGNOSIS — M13162 Monoarthritis, not elsewhere classified, left knee: Secondary | ICD-10-CM

## 2015-08-16 DIAGNOSIS — M1712 Unilateral primary osteoarthritis, left knee: Secondary | ICD-10-CM | POA: Insufficient documentation

## 2015-08-16 NOTE — Assessment & Plan Note (Signed)
I do believe the patient has moderate patellofemoral arthritis as well as some hypoechoic changes. Patient has a little bit of a popliteal tendinitis but I do not think that this is consistent with patient's symptoms. Patient is going to try a brace, home exercise, icing protocol, as well as avoid certain activities. We discussed the importance of proper shoe. We discussed cross training as walking completely. Patient had a make these changes and come back and see me again in 3-4 weeks. Continued have pain I would consider x-rays and likely injection.

## 2015-08-16 NOTE — Progress Notes (Signed)
Pre visit review using our clinic review tool, if applicable. No additional management support is needed unless otherwise documented below in the visit note. 

## 2015-08-16 NOTE — Progress Notes (Signed)
Nichole Smith Nichole Smith, Nichole Smith Phone: [phone] Subjective:    I'm seeing this patient by the request  of:  [name], MD   CC:  Left knee pain  Nichole Smith is a 59 y.o. female coming in with complaint of left knee pain. Patient discussed pain as more of a dull throbbing aching sensation. Patient states that she notices it after dog hair anterior knee and felt a popping sensation. Patient states after that maybe had some mild swelling. Since then states that the pain is very minimal but states that it just does not feel stable. Patient states that there is some instability. Denies any radiation down the leg or any numbness or tingling. Had one episode where it almost, the knee gave out on her. Patient rates the severity of pain as more to out of 10 but the instability is what is concerning. No numbness or weakness. States that she's been resting comfortably. Patient states that she did usually does walking for exercise but is concerned about walking long distances due to this feeling. Has not tried any significant home modalities  Past Medical History  Diagnosis Date  . Hypertension   . Arthritis   . Carcinoid tumor of lung 2012    right found incidentally on chest xray eval for r/o vasxculitis   . 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 HNP (herniated nucleus pulposus), cervical 01/22/2011    C6-C7  Has seen dr Vertell Limber for this and Dr Tonita Cong  Positional numbness in hands no weakness    . Hypothyroidism   . Endometrial cells on cervical Pap smear inconsistent w/LMP 6/11    benign proliferative endometrium on endometrial biopsy  . SUI (stress urinary incontinence, female)     h/o  . Dysrhythmia     irregular occassional  . Double vision    Past Surgical History  Procedure Laterality Date  . Cesarean section    . Shoulder open rotator cuff repair  07/20/2010    Right  . Lung tumor removed      Rt lung  carcinoid  . Dilatation & currettage/hysteroscopy with resectocope N/A 07/18/2013    Procedure: DILATATION & CURETTAGE/HYSTEROSCOPY WITH RESECTOCOPE;  Surgeon: Lyman Speller, MD;  Location: [facility];  Service: Gynecology;  Laterality: N/A;  mass resection   Social History  Substance Use Topics  . Smoking status: Never Smoker   . Smokeless tobacco: Never Used  . Alcohol Use: 1.2 oz/week    2 Standard drinks or equivalent per week   Allergies  Allergen Reactions  . Neomycin     REACTION: Inflammation  . Tramadol Other (See Comments)    dizzness  . Ace Inhibitors Cough   Family History  Problem Relation Age of Onset  . Stroke Mother     died age 34  . Hypertension Mother   . Coronary artery disease Father     died age 62  . Hypertension Father   . Hypertension Maternal Uncle   . Parkinson's disease Brother   . Diabetes Brother   . Hypertension Brother     x2       Past medical history, social, surgical and family history all reviewed in electronic medical record.   Review of Systems: No headache, visual changes, nausea, vomiting, diarrhea, constipation, dizziness, abdominal pain, skin rash, fevers, chills, night sweats, weight loss, swollen lymph nodes, body aches, joint swelling, muscle aches, chest pain, shortness of breath, mood changes.   Objective  Blood pressure 126/72, pulse 66, height 4' 11.75" (1.518 m), weight 221 lb (100.245 kg), last menstrual period 02/12/2011, SpO2 96 %.  General: No apparent distress alert and oriented x3 mood and affect normal, dressed appropriately.  HEENT: Pupils equal, extraocular movements intact  Respiratory: Patient's speak in full sentences and does not appear short of breath  Cardiovascular: No lower extremity edema, non tender, no erythema  Skin: Warm dry intact with no signs of infection or rash on extremities or on axial skeleton.  Abdomen: Soft nontender  Neuro: Cranial nerves II through XII are intact, neurovascularly intact  in all extremities with 2+ DTRs and 2+ pulses.  Lymph: No lymphadenopathy of posterior or anterior cervical chain or axillae bilaterally.  Gait normal with good balance and coordination.  MSK:  Non tender with full range of motion and good stability and symmetric strength and tone of shoulders, elbows, wrist, hip, and ankles bilaterally.  Knee: left Normal to inspection with no erythema or effusion or obvious bony abnormalities.Lateral tilt Tender over the superior lateral joint line. ROM full in flexion and extension and lower leg rotation. Ligaments with solid consistent endpoints including ACL, PCL, LCL, MCL. Negative Mcmurray's, Apley's, and Thessalonian tests.  painful patellar compression. Patellar glide with moderate crepitus. Patellar and quadriceps tendons unremarkable. Hamstring and quadriceps strength is normal.    MSK US performed of: left This study was ordered, performed, and interpreted by Charlann Boxer D.O.  Knee: All structures visualized. Anteromedial, anterolateral, posteromedial, and posterolateral menisci unremarkable without tearing, fraying, effusion, or displacement. Patellar Tendon unremarkable on long and transverse views without effusion. No abnormality of prepatellar bursa. LCL and MCL unremarkable on long and transverse views. No abnormality of origin of medial or lateral head of the gastrocnemius.  IMPRESSION:  Patellofemoral arthritis  Procedure note 68341; 15 minutes spent for Therapeutic exercises as stated in above notes.  This included exercises focusing on stretching, strengthening, with significant focus on eccentric aspects. Patellofemoral Syndrome  Reviewed anatomy using anatomical model and how PFS occurs.  Given rehab exercises handout for VMO, hip abductors, core, entire kinetic chain including proprioception exercises including cone touches, step downs, hip elevations and turn outs.  Could benefit from PT, regular exercise, upright biking,  and a PFS knee brace to assist with tracking abnormalities.    Proper technique shown and discussed handout in great detail with ATC.  All questions were discussed and answered.      Impression and Recommendations:     This case required medical decision making of moderate complexity.

## 2015-08-16 NOTE — Patient Instructions (Signed)
Good to see you Ice is your friend Brace with activity to the knee New exercises for the knee 3 times a week pennsaid pinkie amount topically 2 times daily as needed.  Vitamin D 2000 IU daily Continue to wear good shoes and avoid being barefoot Heel lift 1/16 to 1/8 inch would help Consider icing foot after a lot of walking.  See me again in 3-4 weeks.

## 2015-08-23 ENCOUNTER — Telehealth: Payer: Self-pay | Admitting: Pulmonary Disease

## 2015-08-23 DIAGNOSIS — G4733 Obstructive sleep apnea (adult) (pediatric): Secondary | ICD-10-CM

## 2015-08-23 NOTE — Telephone Encounter (Signed)
Called Nichole Smith, states that pt needs a new renewal for pap machine.  Pt will need new rx for cpap as well as an ov.   Pt is a former [facility] pt with no referral to a new sleep doc.    [facility] are you ok with taking on patient? Thanks!

## 2015-08-24 NOTE — Telephone Encounter (Signed)
Order placed for cpap. lmtcb X1 for pt as she needs to have an ov scheduled with [facility].

## 2015-08-24 NOTE — Telephone Encounter (Signed)
Please send order for auto CPAP range 5 to 15 cm H2O with heated humidity.

## 2015-08-27 ENCOUNTER — Encounter: Payer: Self-pay | Admitting: Internal Medicine

## 2015-08-27 ENCOUNTER — Ambulatory Visit (AMBULATORY_SURGERY_CENTER): Payer: Self-pay | Admitting: *Deleted

## 2015-08-27 ENCOUNTER — Ambulatory Visit (INDEPENDENT_AMBULATORY_CARE_PROVIDER_SITE_OTHER): Admitting: Internal Medicine

## 2015-08-27 VITALS — Ht 59.75 in | Wt 222.4 lb

## 2015-08-27 VITALS — BP 110/58 | HR 66 | Temp 98.1°F | Wt 224.0 lb

## 2015-08-27 DIAGNOSIS — R05 Cough: Secondary | ICD-10-CM | POA: Diagnosis not present

## 2015-08-27 DIAGNOSIS — G4733 Obstructive sleep apnea (adult) (pediatric): Secondary | ICD-10-CM

## 2015-08-27 DIAGNOSIS — R058 Other specified cough: Secondary | ICD-10-CM

## 2015-08-27 DIAGNOSIS — R053 Chronic cough: Secondary | ICD-10-CM

## 2015-08-27 DIAGNOSIS — Z23 Encounter for immunization: Secondary | ICD-10-CM | POA: Diagnosis not present

## 2015-08-27 DIAGNOSIS — Z86010 Personal history of colonic polyps: Secondary | ICD-10-CM

## 2015-08-27 DIAGNOSIS — R059 Cough, unspecified: Secondary | ICD-10-CM

## 2015-08-27 MED ORDER — SUPREP BOWEL PREP KIT 17.5-3.13-1.6 GM/177ML PO SOLN: 1.0000 | Freq: Once | ORAL | Status: DC

## 2015-08-27 MED ORDER — PREDNISONE 10 MG PO TABS: ORAL_TABLET | ORAL | Status: DC

## 2015-08-27 MED ORDER — HYDROCODONE-HOMATROPINE 5-1.5 MG/5ML PO SYRP: ORAL_SOLUTION | ORAL | Status: DC

## 2015-08-27 NOTE — Progress Notes (Signed)
Chief Complaint  Patient presents with  . Follow-up    HPI: Nichole Smith 59 y.o.  comes in for ongoing cough. Her chest surgeon states not related to her lungs. She is on CPAP now may said everything was okay but didn't address her cough more specifically. Sugar-free candy seems to help some but persists she is getting frustrated with this denies any super throat clearing.  Gi :cough   Irritants .[name] Cough  Still there  Got prilosec  Made her gi sx worse restarted and made worse.  Abdominal cramps and doesn't think it helped doesn't think it's reflux. On cpap now Only better after prednisone. In the remote past. Better in  [place].   Worse as day goes on.    Takes hydrocodone at night to help her sleep. No pattern   Of cough  ROS: See pertinent positives and negatives per HPI.  Past Medical History  Diagnosis Date  . Hypertension   . Carcinoid tumor of lung 2012    right found incidentally on chest xray eval for r/o vasxculitis   . 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 HNP (herniated nucleus pulposus), cervical 01/22/2011    C6-C7  Has seen dr Vertell Limber for this and Dr Tonita Cong  Positional numbness in hands no weakness    . Hypothyroidism   . Endometrial cells on cervical Pap smear inconsistent w/LMP 6/11    benign proliferative endometrium on endometrial biopsy  . SUI (stress urinary incontinence, female)     h/o  . Dysrhythmia     irregular occassional, no current problem  . Double vision     history, no current problem  . Sleep apnea     uses CPAP nightly  . Arthritis     knees, back    Family History  Problem Relation Age of Onset  . Stroke Mother     died age 74  . Hypertension Mother   . Coronary artery disease Father     died age 81  . Hypertension Father   . Hypertension Maternal Uncle   . Parkinson's disease Brother   . Diabetes Brother   . Hypertension Brother     x2  . Colon cancer Neg Hx   . Rectal cancer Neg Hx   . Stomach cancer Neg Hx      Social History   Social History  . Marital Status: Married    Spouse Name: N/A  . Number of Children: 3  . Years of Education: N/A   Occupational History  . retied    Social History Main Topics  . Smoking status: Never Smoker   . Smokeless tobacco: Never Used  . Alcohol Use: 1.2 oz/week    2 Glasses of wine per week  . Drug Use: No  . Sexual Activity:    Partners: Male    Birth Control/ Protection: None, Post-menopausal   Other Topics Concern  . 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 amLODipine (NORVASC) 5 MG tablet Take 1 tablet (5 mg total) by mouth daily. 90 tablet 3  . aspirin 81 MG tablet Take 81 mg by mouth daily.    [name] CALCIUM PO Take by mouth 2 (two) times daily.    . clobetasol cream (  TEMOVATE) 0.05 % as needed.     . methocarbamol (ROBAXIN) 750 MG tablet 1-2 po tid  PO for muscle spasm 60 tablet 1  . metoprolol succinate (TOPROL-XL) 100 MG 24 hr tablet Take 1 tablet (100 mg total) by mouth daily. Take with or immediately following a meal. 90 tablet 1  . Multiple Vitamins-Minerals (EYE VITAMINS PO) Take 1 capsule by mouth 2 (two) times daily. "Eye caps"    . Multiple Vitamins-Minerals (MULTIVITAMIN PO) Take 1 tablet by mouth daily.     . Omega-3 Fatty Acids (FISH OIL PO) Take 1 capsule by mouth daily.     [name] SYNTHROID 137 MCG tablet TAKE 1 TABLET DAILY 90 tablet 3  . triamcinolone ointment (KENALOG) 0.5 % Apply 1 application topically 2 (two) times daily. 30 g 1  . triamterene-hydrochlorothiazide (MAXZIDE-25) 37.5-25 MG per tablet TAKE 1 TABLET EVERY DAY 90 tablet 1  . HYDROcodone-homatropine (HYCODAN) 5-1.5 MG/5ML syrup 74mby mouth a bedtime as needed for cough. 120 mL 0   No facility-administered medications prior to visit.     EXAM:  BP 110/58 mmHg  Pulse 66   Temp(Src) 98.1 F (36.7 C) (Oral)  Wt 224 lb (101.606 kg)  SpO2 97%  LMP 02/12/2011  Body mass index is 44.09 kg/(m^2).  GENERAL: vitals reviewed and listed above, alert, oriented, appears well hydrated and in no acute distress is not particularly hoarse no stridor and no cough during the exam. HEENT: atraumatic, conjunctiva  clear, no obvious abnormalities on inspection of external nose and ears OP : no lesion edema or exudate  NECK: no obvious masses on inspection palpation  LUNGS: clear to auscultation bilaterally, no wheezes, rales or rhonchi, good air movement CV: HRRR, no clubbing cyanosis or  peripheral edema nl cap refill  MS: moves all extremities without noticeable focal  abnormality PSYCH: pleasant and cooperative, no obvious depression or anxiety Lab Results  Component Value Date   WBC 8.7 02/19/2015   HGB 15.2* 02/19/2015   HCT 44.4 02/19/2015   PLT 258.0 02/19/2015   GLUCOSE 101* 02/19/2015   CHOL 147 02/19/2015   TRIG 104.0 02/19/2015   HDL 44.70 02/19/2015   LDLCALC 82 02/19/2015   ALT 21 02/19/2015   AST 22 02/19/2015   NA 137 02/19/2015   K 3.8 02/19/2015   CL 100 02/19/2015   CREATININE 0.74 02/19/2015   BUN 16 02/19/2015   CO2 31 02/19/2015   TSH 2.17 02/19/2015   INR 0.96 06/11/2011    ASSESSMENT AND PLAN:  Discussed the following assessment and plan:  Cough, persistent  Upper airway cough syndrome ?  Cough - Plan: HYDROcodone-homatropine (HYCODAN) 5-1.5 MG/5ML syrup  Need for prophylactic vaccination and inoculation against influenza - Plan: Flu Vaccine QUAD 36+ mos IM  Obstructive sleep apnea Discussed doing another short course of prednisone and Chlor-Trimeton 4 mg at night or the 8 mg extended release. Drowsiness precautions. If this doesn't calm it down further contact [place] we may need to send pulmonary. She was seeing Dr. CGwenette Greetwill need a new sleep doctor anyway.  -Patient advised to return or notify health care team  if symptoms  worsen ,persist or new concerns arise.  Patient Instructions  Another  Short course prednisone.  40 40 '20 20 10 10 '$ Add chlorpheniramine   At night   4 mg - 8 mg    Continue cough suppression therapy.  If not getting better in the next 2-3 weeks   then plan pulmonary advice .  Standley Brooking. Panosh M.D.

## 2015-08-27 NOTE — Telephone Encounter (Signed)
lmtcb x2 

## 2015-08-27 NOTE — Progress Notes (Signed)
Patient denies any allergies to egg or soy products. Patient denies complications with anesthesia/sedation.  Patient denies oxygen use at home and denies diet medications. Emmi instructions for colonoscopy but patient refused.   

## 2015-08-27 NOTE — Progress Notes (Signed)
Pre visit review using our clinic review tool, if applicable. No additional management support is needed unless otherwise documented below in the visit note. 

## 2015-08-27 NOTE — Patient Instructions (Addendum)
Another  Short course prednisone.  40 40 '20 20 10 10 '$ Add chlorpheniramine   At night   4 mg - 8 mg    Continue cough suppression therapy.  If not getting better in the next 2-3 weeks   then plan pulmonary advice .

## 2015-08-28 NOTE — Telephone Encounter (Signed)
Spoke with pt and advised that order was sent for new cpap for pt .  Pt advised to call back in December to schedule appt with Dr Halford Chessman to establish for sleep in January.

## 2015-08-28 NOTE — Telephone Encounter (Signed)
lmomtcb for pt on home and cell #s 

## 2015-08-28 NOTE — Telephone Encounter (Signed)
Pt returning call and can be reached [phone].Nichole Smith

## 2015-09-10 ENCOUNTER — Other Ambulatory Visit: Payer: Self-pay | Admitting: Internal Medicine

## 2015-09-10 ENCOUNTER — Ambulatory Visit (AMBULATORY_SURGERY_CENTER): Admitting: Gastroenterology

## 2015-09-10 ENCOUNTER — Encounter: Payer: Self-pay | Admitting: Gastroenterology

## 2015-09-10 VITALS — BP 131/74 | HR 54 | Temp 96.2°F | Resp 16 | Ht 59.0 in | Wt 222.0 lb

## 2015-09-10 DIAGNOSIS — D122 Benign neoplasm of ascending colon: Secondary | ICD-10-CM | POA: Diagnosis not present

## 2015-09-10 DIAGNOSIS — Z1211 Encounter for screening for malignant neoplasm of colon: Secondary | ICD-10-CM | POA: Diagnosis not present

## 2015-09-10 DIAGNOSIS — D128 Benign neoplasm of rectum: Secondary | ICD-10-CM

## 2015-09-10 DIAGNOSIS — K621 Rectal polyp: Secondary | ICD-10-CM | POA: Diagnosis not present

## 2015-09-10 DIAGNOSIS — Z86010 Personal history of colonic polyps: Secondary | ICD-10-CM

## 2015-09-10 MED ORDER — SODIUM CHLORIDE 0.9 % IV SOLN: 500.0000 mL | INTRAVENOUS | Status: DC

## 2015-09-10 NOTE — Progress Notes (Signed)
Called to room to assist during endoscopic procedure.  Patient ID and intended procedure confirmed with present staff. Received instructions for my participation in the procedure from the performing physician.  

## 2015-09-10 NOTE — Progress Notes (Signed)
Stable to RR 

## 2015-09-10 NOTE — Op Note (Signed)
[place]  [employer] [place], [id number]   COLONOSCOPY PROCEDURE REPORT  PATIENT: Nichole, Smith  MR#: [id number] BIRTHDATE: 1956-01-02 , 59  yrs. old GENDER: female ENDOSCOPIST: Harl Bowie, MD REFERRED JH:ERDEY Darnelle Going, M.D. PROCEDURE DATE:  09/10/2015 PROCEDURE:   Colonoscopy, screening and Colonoscopy with cold biopsy polypectomy First Screening Colonoscopy - Avg.  risk and is 50 yrs.  old or older - No.  Prior Negative Screening - Now for repeat screening. 10 or more years since last screening  History of Adenoma - Now for follow-up colonoscopy & has been > or = to 3 yrs.  N/A Polyps removed today? Yes ASA CLASS:   Class II INDICATIONS:Screening for colonic neoplasia and Colorectal Neoplasm Risk Assessment for this procedure is average risk. MEDICATIONS: Propofol 400 mg IV and Lidocaine 40 mg IV  DESCRIPTION OF PROCEDURE:   After the risks benefits and alternatives of the procedure were thoroughly explained, informed consent was obtained.  The digital rectal exam revealed a normal rectal and perianal exam.   The LB [id number]  endoscope was introduced through the anus and advanced to the cecum, which was identified by both the appendix and ileocecal valve. No adverse events experienced.   The quality of the prep was good.  The instrument was then slowly withdrawn as the colon was fully examined. Estimated blood loss is zero unless otherwise noted in this procedure report.   COLON FINDINGS: Two sessile polyps ranging between 3-61m in size were found in the rectum and ascending colon.  Polypectomies were performed with cold forceps.  The resection was complete, the polyp tissue was completely retrieved and sent to histology.   There was mild diverticulosis noted in the sigmoid colon.   The examination was otherwise normal.  Retroflexed views revealed internal hemorrhoids. The time to cecum = 13.1 Withdrawal time = 12.6   The scope was  withdrawn and the procedure completed. COMPLICATIONS: There were no immediate complications.  ENDOSCOPIC IMPRESSION: 1.   Two sessile polyps ranging between 3-583min size were found in the rectum and ascending colon; polypectomies were performed with cold forceps 2.   There was mild diverticulosis noted in the sigmoid colon 3.   The examination was otherwise normal  RECOMMENDATIONS: If the polyp(s) that were removed today are proven to be adenomatous (pre-cancerous) polyps, you will need a repeat colonoscopy in 5 years.  Otherwise you should continue to follow colorectal cancer screening guidelines for "routine risk" patients with colonoscopy in 10 years.  You will receive a letter within 1-2 weeks with the results of your biopsy as well as final recommendations.  Please call my office if you have not received a letter after 3 weeks.  eSigned:  KaHarl BowieMD 09/10/2015 10:24 AM    y   PATIENT NAME:  Nichole, BurgesR#: [id number]

## 2015-09-10 NOTE — Patient Instructions (Signed)
YOU HAD AN ENDOSCOPIC PROCEDURE TODAY AT THE [facility]:   Refer to the procedure report that was given to you for any specific questions about what was found during the examination.  If the procedure report does not answer your questions, please call your gastroenterologist to clarify.  If you requested that your care partner not be given the details of your procedure findings, then the procedure report has been included in a sealed envelope for you to review at your convenience later.  YOU SHOULD EXPECT: Some feelings of bloating in the abdomen. Passage of more gas than usual.  Walking can help get rid of the air that was put into your GI tract during the procedure and reduce the bloating. If you had a lower endoscopy (such as a colonoscopy or flexible sigmoidoscopy) you may notice spotting of blood in your stool or on the toilet paper. If you underwent a bowel prep for your procedure, you may not have a normal bowel movement for a few days.  Please Note:  You might notice some irritation and congestion in your nose or some drainage.  This is from the oxygen used during your procedure.  There is no need for concern and it should clear up in a day or so.  SYMPTOMS TO REPORT IMMEDIATELY:   Following lower endoscopy (colonoscopy or flexible sigmoidoscopy):  Excessive amounts of blood in the stool  Significant tenderness or worsening of abdominal pains  Swelling of the abdomen that is new, acute  Fever of 100F or higher   For urgent or emergent issues, a gastroenterologist can be reached at any hour by calling [phone].   DIET: Your first meal following the procedure should be a small meal and then it is ok to progress to your normal diet. Heavy or fried foods are harder to digest and may make you feel nauseous or bloated.  Likewise, meals heavy in dairy and vegetables can increase bloating.  Drink plenty of fluids but you should avoid alcoholic beverages for 24  hours.  ACTIVITY:  You should plan to take it easy for the rest of today and you should NOT DRIVE or use heavy machinery until tomorrow (because of the sedation medicines used during the test).    FOLLOW UP: Our staff will call the number listed on your records the next business day following your procedure to check on you and address any questions or concerns that you may have regarding the information given to you following your procedure. If we do not reach you, we will leave a message.  However, if you are feeling well and you are not experiencing any problems, there is no need to return our call.  We will assume that you have returned to your regular daily activities without incident.  If any biopsies were taken you will be contacted by phone or by letter within the next 1-3 weeks.  Please call us at [phone] if you have not heard about the biopsies in 3 weeks.    SIGNATURES/CONFIDENTIALITY: You and/or your care partner have signed paperwork which will be entered into your electronic medical record.  These signatures attest to the fact that that the information above on your After Visit Summary has been reviewed and is understood.  Full responsibility of the confidentiality of this discharge information lies with you and/or your care-partner. 

## 2015-09-11 ENCOUNTER — Telehealth: Payer: Self-pay | Admitting: *Deleted

## 2015-09-11 NOTE — Telephone Encounter (Signed)
Ok to give 90 days refill x 1 ( total 6 months)

## 2015-09-11 NOTE — Telephone Encounter (Signed)
Sent to the pharmacy by e-scribe. 

## 2015-09-11 NOTE — Telephone Encounter (Signed)
  Follow up Call-  Call back number 09/10/2015  Post procedure Call Back phone  # [phone]  Permission to leave phone message Yes     Patient questions:  Do you have a fever, pain , or abdominal swelling? No. Pain Score  0 *  Have you tolerated food without any problems? Yes.    Have you been able to return to your normal activities? Yes.    Do you have any questions about your discharge instructions: Diet   No. Medications  No. Follow up visit  No.  Do you have questions or concerns about your Care? No.  Actions: * If pain score is 4 or above: No action needed, pain <4.

## 2015-09-12 ENCOUNTER — Encounter: Payer: Self-pay | Admitting: Family Medicine

## 2015-09-12 ENCOUNTER — Ambulatory Visit (INDEPENDENT_AMBULATORY_CARE_PROVIDER_SITE_OTHER): Admitting: Family Medicine

## 2015-09-12 VITALS — BP 126/82 | HR 58 | Ht 59.75 in | Wt 217.0 lb

## 2015-09-12 DIAGNOSIS — M13162 Monoarthritis, not elsewhere classified, left knee: Secondary | ICD-10-CM

## 2015-09-12 DIAGNOSIS — M216X2 Other acquired deformities of left foot: Secondary | ICD-10-CM

## 2015-09-12 DIAGNOSIS — M1712 Unilateral primary osteoarthritis, left knee: Secondary | ICD-10-CM

## 2015-09-12 NOTE — Assessment & Plan Note (Signed)
Patient does have a loss of the transverse arch of the feet bilaterally left than right. 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 We discussed with patient that this could also unfortunately exacerbate her knee pain as well as her back pain. Patient will be fitted with custom orthotics. We discussed proper shoes as well as over-the-counter inserts that can be helpful patient then will come back and see me again in one month after the orthotics.

## 2015-09-12 NOTE — Patient Instructions (Signed)
That knee is doing wonderful We will get you scheduled for the orthotics Ice is your friend COnsider the spenco orthotics as well "total support" Look into the shoe Good shoes with rigid bottom.  Jalene Mullet, Merrell or New balance greater then 700 Sabino Gasser and allegria could be helpful See m eagain 1 month after the orthotics

## 2015-09-12 NOTE — Progress Notes (Signed)
Pre visit review using our clinic review tool, if applicable. No additional management support is needed unless otherwise documented below in the visit note. 

## 2015-09-12 NOTE — Progress Notes (Signed)
[name] [address], [place] [address] Phone: [phone] Subjective:    I'm seeing this patient by the request  of:  [name], MD   CC:  Left knee pain  IRJ:[id number] Nichole Smith is a 59 y.o. female coming in with complaint of left knee pain. On the have more of a patellofemoral arthritis. Patient elected try conservative therapy including home exercises, icing pedicle, topical anti-inflammatories and we discussed over-the-counter natural supplementations. Patient statesher knee is feeling significantly better at this time. Patient states that she's been able to do a lot more activity. Has noticed that she is getting stronger. Not taking any pain medications at this time.  Patient is complaining of bilateral foot pain though. Seems to be left greater than right. States that it with a lot of walking she does have some discomfort mostly over the dorsal aspect of the foot.denies any numbness. Not stopping her from any activity. Has tried topical anti-inflammatory which has been helpful.    Past Medical History  Diagnosis Date  . Hypertension   . Carcinoid tumor of lung 2012    right found incidentally on chest xray eval for r/o vasxculitis   . Leukocytoclastic vasculitis ([place]) 05/26/2011  . HNP (herniated nucleus pulposus), cervical 01/22/2011    C6-C7  Has seen dr Vertell Limber for this and Dr Tonita Cong  Positional numbness in hands no weakness    . Hypothyroidism   . Endometrial cells on cervical Pap smear inconsistent w/LMP 6/11    benign proliferative endometrium on endometrial biopsy  . SUI (stress urinary incontinence, female)     h/o  . Dysrhythmia     irregular occassional, no current problem  . Double vision     history, no current problem  . Sleep apnea     uses CPAP nightly  . Arthritis     knees, back   Past Surgical History  Procedure Laterality Date  . Cesarean section      x 1 - twins  . Shoulder open rotator cuff  repair  07/20/2010    Right  . Lung tumor removed      Rt lung carcinoid  . Dilatation & currettage/hysteroscopy with resectocope N/A 07/18/2013    Procedure: DILATATION & CURETTAGE/HYSTEROSCOPY WITH RESECTOCOPE;  Surgeon: Lyman Speller, MD;  Location: [facility];  Service: Gynecology;  Laterality: N/A;  mass resection  . Colonoscopy  07/31/05    brodie - polyps and bx's  . Wisdom tooth extraction     Social History  Substance Use Topics  . Smoking status: Never Smoker   . Smokeless tobacco: Never Used  . Alcohol Use: 1.2 oz/week    2 Glasses of wine per week   Allergies  Allergen Reactions  . Neomycin     REACTION: Inflammation  . Tramadol Other (See Comments)    dizzness  . Ace Inhibitors Cough   Family History  Problem Relation Age of Onset  . Stroke Mother     died age 35  . Hypertension Mother   . Coronary artery disease Father     died age 33  . Hypertension Father   . Hypertension Maternal Uncle   . Parkinson's disease Brother   . Diabetes Brother   . Hypertension Brother     x2  . Colon cancer Neg Hx   . Rectal cancer Neg Hx   . Stomach cancer Neg Hx        Past medical history, social, surgical and  family history all reviewed in electronic medical record.   Review of Systems: No headache, visual changes, nausea, vomiting, diarrhea, constipation, dizziness, abdominal pain, skin rash, fevers, chills, night sweats, weight loss, swollen lymph nodes, body aches, joint swelling, muscle aches, chest pain, shortness of breath, mood changes.   Objective Blood pressure 126/82, pulse 58, height 4' 11.75" (1.518 m), weight 217 lb (98.431 kg), last menstrual period 02/12/2011, SpO2 97 %.  General: No apparent distress alert and oriented x3 mood and affect normal, dressed appropriately.  HEENT: Pupils equal, extraocular movements intact  Respiratory: Patient's speak in full sentences and does not appear short of breath  Cardiovascular: No lower extremity edema, non  tender, no erythema  Skin: Warm dry intact with no signs of infection or rash on extremities or on axial skeleton.  Abdomen: Soft nontender  Neuro: Cranial nerves II through XII are intact, neurovascularly intact in all extremities with 2+ DTRs and 2+ pulses.  Lymph: No lymphadenopathy of posterior or anterior cervical chain or axillae bilaterally.  Gait normal with good balance and coordination.  MSK:  Non tender with full range of motion and good stability and symmetric strength and tone of shoulders, elbows, wrist, hip, and ankles bilaterally.  Knee: left Normal to inspection with no erythema or effusion or obvious bony abnormalities.Lateral tilt Continued mild tenderness over the superior lateral joint line ROM full in flexion and extension and lower leg rotation. Ligaments with solid consistent endpoints including ACL, PCL, LCL, MCL. Negative Mcmurray's, Apley's, and Thessalonian tests.  mild painful patellar compression. Patellar glide with moderate crepitus. Patellar and quadriceps tendons unremarkable. Hamstring and quadriceps strength is normal.  Foot exam shows the patient does have some mild overpronation of the hindfoot as well as takedown of the transverse arch. Minimally tender to palpation over the dorsal aspect of the midfoot on the left leg. Full range of motion and ankle and neurovascularly intact distally.      Impression and Recommendations:     This case required medical decision making of moderate complexity.

## 2015-09-12 NOTE — Assessment & Plan Note (Signed)
Doing considerably better with conservative therapy at this point. We discussed icing regimen and home exercises. We discussed which activities to do a which was potentially avoid. Patient come back and see me again in 4-6 weeks for further evaluation and treatment.

## 2015-09-18 ENCOUNTER — Encounter: Payer: Self-pay | Admitting: Gastroenterology

## 2015-09-24 ENCOUNTER — Ambulatory Visit (INDEPENDENT_AMBULATORY_CARE_PROVIDER_SITE_OTHER): Admitting: Family Medicine

## 2015-09-24 DIAGNOSIS — M216X2 Other acquired deformities of left foot: Secondary | ICD-10-CM

## 2015-09-24 NOTE — Patient Instructions (Signed)
Acclimating to your Igli orthotics -  ° °We recommend that you allow up to 2 weeks for you to fully acclimate to your new custom orthotics. Please use the following recommended plan to build into full day wear.  ° °Day 1 - 2hours/day °Every day afterwards add 1 hour of wear(3hrs/day, 4hrs/day, etc) until you are able to wear them for an entire day without issues.  ° °If you notice any irritation or increasing discomfort with your new orthotics, please do not hesitate in contacting the office(leave a message for Tj Kitchings or Lindsay) or sending Demiya Magno a message through MyChart to arrange a time to review your fit.  ° °Enjoy your new orthotics!!  ° °Nichole Smith ° ° ° °

## 2015-09-24 NOTE — Progress Notes (Signed)
Patient was fitted for a : standard, cushioned, semi-rigid orthotic. The orthotic was heated and afterward the patient was in a seated position and the orthotic molded. The patient was positioned in subtalar neutral position and 10 degrees of ankle dorsiflexion in a non-weight bearing stance. After completion of molding, patient did have orthotic management which included instructions on acclimating to the orthotics, signs of ill fit as well as care for the orthotic.  I spent approximately 108mnuets with the patient properly fitting the orthotics as well as discussing changes to footwear and care for the orthotics.   The blank was ground to a stable position for weight bearing. Size: 9.5 (Igli Comfort)  Base: Carbon fiber Additional Posting and Padding: The following postings were fitted onto the molded orthotics to help maintain a talar neutral position - Wedge posting for transverse arch:  2620/35     Silicone posting for longitudinal arch: 250/100 x2 bilaterally  Also 100/50 under left heel as lift  The patient ambulated these, and they were very comfortable and supportive.

## 2015-09-24 NOTE — Assessment & Plan Note (Signed)
Orthotics made and patient will slowly increase wear over the course of time. We discussed icing regimen. His lungs patient continues along well she will come back in 4 weeks for further evaluation and treatment.

## 2015-10-24 ENCOUNTER — Encounter: Payer: Self-pay | Admitting: Family Medicine

## 2015-10-24 ENCOUNTER — Ambulatory Visit (INDEPENDENT_AMBULATORY_CARE_PROVIDER_SITE_OTHER): Admitting: Family Medicine

## 2015-10-24 VITALS — BP 120/76 | HR 61 | Ht 59.75 in | Wt 216.0 lb

## 2015-10-24 DIAGNOSIS — M13162 Monoarthritis, not elsewhere classified, left knee: Secondary | ICD-10-CM

## 2015-10-24 DIAGNOSIS — M7062 Trochanteric bursitis, left hip: Secondary | ICD-10-CM | POA: Diagnosis not present

## 2015-10-24 DIAGNOSIS — G5702 Lesion of sciatic nerve, left lower limb: Secondary | ICD-10-CM

## 2015-10-24 DIAGNOSIS — M1712 Unilateral primary osteoarthritis, left knee: Secondary | ICD-10-CM

## 2015-10-24 NOTE — Assessment & Plan Note (Signed)
Likely contributing some of these. Patient may need injection. Encourage hip abductor strengthening exercises. Work with Product/process development scientist. Patient come back in 4 weeks for further evaluation.

## 2015-10-24 NOTE — Assessment & Plan Note (Signed)
Piriformis Syndrome  Using an anatomical model, reviewed with the patient the structures involved and how they related to diagnosis. The patient indicated understanding.   The patient was given a handout from Dr. Rouzier's book "The Sports Medicine Patient Advisor" describing the anatomy and rehabilitation of the following condition: Piriformis Syndrome  Also given a handout with more extensive Piriformis stretching, hip flexor and abductor strengthening, ham stretching  Rec deep massage, explained self-massage with ball  

## 2015-10-24 NOTE — Patient Instructions (Addendum)
Happy holidays! Good to see you Continue the exercises but alternate with the new ones Exercises on wall.  Heel and butt touching.  Raise leg 6 inches and hold 2 seconds.  Down slow for count of 4 seconds.  1 set of 30 reps daily on both sides.  Stay active.  Keep hitting the gym See me again in 4 weeks if not perfect and we can inject it.

## 2015-10-24 NOTE — Assessment & Plan Note (Signed)
Very well controlled at this time. Encourage patient to continue the same conservative management.

## 2015-10-24 NOTE — Progress Notes (Signed)
[facility] [address], [place] [address] Phone: [phone] Subjective:     CC:  Left knee pain follow up and bilateral foot pain.   CVE:[id number] Nichole Smith is a 59 y.o. female coming in with complaint of left knee pain. Patient was found to have more of a patellofemoral arthritis. Because the patient's malalignment of her feet we think that this is contributing. Patient was putting custom orthotics and do home exercises, icing protocol, topical anti-inflammatories and bracing. Patient states overall her feet is doing significantly better. Not having as much pain. Patient states that she is able to do her walking and working on a very regular basis.  She now is complaining of more of a left buttocks pain. Patient states sometimes it seems to radiate towards the lateral aspect of her hip. Nothing that stopping her from activity. Can be very painful especially after sitting a long amount of time. Seems to get better when she starts doing activity. Sometimes can be uncomfortable at night but not waking her up at night. Rates the severity of pain a 5 out of 10. Does not remember any trauma.  Past Medical History  Diagnosis Date  . Hypertension   . Carcinoid tumor of lung 2012    right found incidentally on chest xray eval for r/o vasxculitis   . 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 HNP (herniated nucleus pulposus), cervical 01/22/2011    C6-C7  Has seen dr Vertell Limber for this and Dr Tonita Cong  Positional numbness in hands no weakness    . Hypothyroidism   . Endometrial cells on cervical Pap smear inconsistent w/LMP 6/11    benign proliferative endometrium on endometrial biopsy  . SUI (stress urinary incontinence, female)     h/o  . Dysrhythmia     irregular occassional, no current problem  . Double vision     history, no current problem  . Sleep apnea     uses CPAP nightly  . Arthritis     knees, back   Past Surgical History  Procedure  Laterality Date  . Cesarean section      x 1 - twins  . Shoulder open rotator cuff repair  07/20/2010    Right  . Lung tumor removed      Rt lung carcinoid  . Dilatation & currettage/hysteroscopy with resectocope N/A 07/18/2013    Procedure: DILATATION & CURETTAGE/HYSTEROSCOPY WITH RESECTOCOPE;  Surgeon: Lyman Speller, MD;  Location: [facility];  Service: Gynecology;  Laterality: N/A;  mass resection  . Colonoscopy  07/31/05    brodie - polyps and bx's  . Wisdom tooth extraction     Social History  Substance Use Topics  . Smoking status: Never Smoker   . Smokeless tobacco: Never Used  . Alcohol Use: 1.2 oz/week    2 Glasses of wine per week   Allergies  Allergen Reactions  . Neomycin     REACTION: Inflammation  . Tramadol Other (See Comments)    dizzness  . Ace Inhibitors Cough   Family History  Problem Relation Age of Onset  . Stroke Mother     died age 39  . Hypertension Mother   . Coronary artery disease Father     died age 29  . Hypertension Father   . Hypertension Maternal Uncle   . Parkinson's disease Brother   . Diabetes Brother   . Hypertension Brother     x2  . Colon cancer Neg Hx   .  Rectal cancer Neg Hx   . Stomach cancer Neg Hx        Past medical history, social, surgical and family history all reviewed in electronic medical record.   Review of Systems: No headache, visual changes, nausea, vomiting, diarrhea, constipation, dizziness, abdominal pain, skin rash, fevers, chills, night sweats, weight loss, swollen lymph nodes, body aches, joint swelling, muscle aches, chest pain, shortness of breath, mood changes.   Objective Last menstrual period 02/12/2011.  General: No apparent distress alert and oriented x3 mood and affect normal, dressed appropriately.  HEENT: Pupils equal, extraocular movements intact  Respiratory: Patient's speak in full sentences and does not appear short of breath  Cardiovascular: No lower extremity edema, non tender, no  erythema  Skin: Warm dry intact with no signs of infection or rash on extremities or on axial skeleton.  Abdomen: Soft nontender  Neuro: Cranial nerves II through XII are intact, neurovascularly intact in all extremities with 2+ DTRs and 2+ pulses.  Lymph: No lymphadenopathy of posterior or anterior cervical chain or axillae bilaterally.  Gait normal with good balance and coordination.  MSK:  Non tender with full range of motion and good stability and symmetric strength and tone of shoulders, elbows, wrist, hip, and ankles bilaterally.  Back Exam:  Inspection: Unremarkable  Motion: Flexion 45 deg, Extension 45 deg, Side Bending to 45 deg bilaterally,  Rotation to 45 deg bilaterally  SLR laying: Negative  XSLR laying: Negative  Palpable tenderness: Tender to palpation over the left piriformis as well as the left greater trochanteric area. FABER: Positive left Sensory change: Gross sensation intact to all lumbar and sacral dermatomes.  Reflexes: 2+ at both patellar tendons, 2+ at achilles tendons, Babinski's downgoing.  Strength at foot  Plantar-flexion: 5/5 Dorsi-flexion: 5/5 Eversion: 5/5 Inversion: 5/5  Leg strength  Quad: 5/5 Hamstring: 5/5 Hip flexor: 5/5 Hip abductors: 3+/5  Gait unremarkable.  Knee: left Normal to inspection with no erythema or effusion or obvious bony abnormalities.Lateral tilt Nontender on exam ROM full in flexion and extension and lower leg rotation. Ligaments with solid consistent endpoints including ACL, PCL, LCL, MCL. Negative Mcmurray's, Apley's, and Thessalonian tests.  painful patellar compression. Patellar glide with moderate crepitus still present. Patellar and quadriceps tendons unremarkable. Hamstring and quadriceps strength is normal.   Procedure note 26948; 15 minutes spent for Therapeutic exercises as stated in above notes.  This included exercises focusing on stretching, strengthening, with significant focus on eccentric aspects.   Piriformis  Syndrome  Using an anatomical model, reviewed with the patient the structures involved and how they related to diagnosis. The patient indicated understanding.   The patient was given a handout from Dr. Arne Cleveland book "The Sports Medicine Patient Advisor" describing the anatomy and rehabilitation of the following condition: Piriformis Syndrome  Also given a handout with more extensive Piriformis stretching, hip flexor and abductor strengthening, ham stretching  Rec deep massage, explained self-massage with ball   Proper technique shown and discussed handout in great detail with ATC.  All questions were discussed and answered.     Impression and Recommendations:     This case required medical decision making of moderate complexity.

## 2015-10-24 NOTE — Progress Notes (Signed)
Pre visit review using our clinic review tool, if applicable. No additional management support is needed unless otherwise documented below in the visit note. 

## 2015-11-27 ENCOUNTER — Ambulatory Visit: Admitting: Family Medicine

## 2015-12-27 ENCOUNTER — Telehealth: Payer: Self-pay | Admitting: Internal Medicine

## 2015-12-27 DIAGNOSIS — D582 Other hemoglobinopathies: Secondary | ICD-10-CM

## 2015-12-27 DIAGNOSIS — I10 Essential (primary) hypertension: Secondary | ICD-10-CM

## 2015-12-27 DIAGNOSIS — E039 Hypothyroidism, unspecified: Secondary | ICD-10-CM

## 2015-12-27 DIAGNOSIS — Z791 Long term (current) use of non-steroidal anti-inflammatories (NSAID): Secondary | ICD-10-CM

## 2015-12-27 NOTE — Telephone Encounter (Signed)
Pt states her GYN is doing her physical this year, but does not do labs.  Pt would like to follow up with dr Regis Bill on her meds, but is requesting an order for labs prior to the appointment. Please advise if OK .  Pt did have cpe last year, but does not feel she needs all that this year due to thorough exam by GYN. Can you put orders in?

## 2015-12-30 NOTE — Telephone Encounter (Signed)
I agree that no need for prevenetion visit  However labs cannot be coded under prevention  But  under dx codes.  And not a preventive code .  And uncertain how that effects her cost copay etc,   Dx  To be used on  Problem list : hypertension  , hypothyroid, elevated  Hemoglobin, long term  nsaid use , obesity   order bmp , tsh cbc,diff , lfts   in these diagnosis

## 2015-12-31 ENCOUNTER — Other Ambulatory Visit: Payer: Self-pay | Admitting: Family Medicine

## 2015-12-31 DIAGNOSIS — D582 Other hemoglobinopathies: Secondary | ICD-10-CM

## 2015-12-31 DIAGNOSIS — E669 Obesity, unspecified: Secondary | ICD-10-CM

## 2015-12-31 DIAGNOSIS — E039 Hypothyroidism, unspecified: Secondary | ICD-10-CM

## 2015-12-31 DIAGNOSIS — I10 Essential (primary) hypertension: Secondary | ICD-10-CM

## 2015-12-31 DIAGNOSIS — Z791 Long term (current) use of non-steroidal anti-inflammatories (NSAID): Secondary | ICD-10-CM

## 2015-12-31 NOTE — Telephone Encounter (Signed)
I have placed the lab orders.  Please help pt to make lab appt and OV if needed.  Thanks!

## 2016-01-01 NOTE — Telephone Encounter (Signed)
Left message on voice mail  to call back

## 2016-01-04 NOTE — Telephone Encounter (Signed)
Pt has been scheduled for labs only.

## 2016-02-20 ENCOUNTER — Other Ambulatory Visit

## 2016-02-20 ENCOUNTER — Other Ambulatory Visit: Payer: Self-pay | Admitting: Internal Medicine

## 2016-02-21 NOTE — Telephone Encounter (Signed)
Due for labs and yearly visit   Ok to refill both meds for  3 months. Need OV before more refills

## 2016-02-22 ENCOUNTER — Telehealth: Payer: Self-pay | Admitting: Family Medicine

## 2016-02-22 NOTE — Telephone Encounter (Signed)
Pt due for cpx and lab work in 3 months.  Please help her to make these appointments.  Lab orders in the system.

## 2016-02-22 NOTE — Telephone Encounter (Signed)
Pt has an appt for cpx with her gyn and pt will have labs drawn her in april

## 2016-02-22 NOTE — Telephone Encounter (Signed)
Sent to the pharmacy by e-scribe.  Message sent to scheduling. 

## 2016-02-28 ENCOUNTER — Ambulatory Visit: Admitting: Internal Medicine

## 2016-03-04 ENCOUNTER — Ambulatory Visit (INDEPENDENT_AMBULATORY_CARE_PROVIDER_SITE_OTHER): Admitting: Obstetrics & Gynecology

## 2016-03-04 ENCOUNTER — Encounter: Payer: Self-pay | Admitting: Obstetrics & Gynecology

## 2016-03-04 VITALS — BP 124/70 | HR 66 | Resp 16 | Ht 59.25 in | Wt 216.0 lb

## 2016-03-04 DIAGNOSIS — Z01419 Encounter for gynecological examination (general) (routine) without abnormal findings: Secondary | ICD-10-CM | POA: Diagnosis not present

## 2016-03-04 NOTE — Progress Notes (Signed)
60 y.o. G3P3 MarriedCaucasianF here for annual exam.  Doing well.  No vaginal bleeding.  Denies vaginal motor symptoms.    Patient's last menstrual period was 02/12/2011.          Sexually active: Yes.    The current method of family planning is post menopausal status.    Exercising: Yes.    Walking, strength, water Smoker:  no  Health Maintenance: Pap:  12/29/14 Neg. 05/19/12 Neg. HR HPV:neg History of abnormal Pap:  no MMG:  07/03/15 BIRADS1:neg Colonoscopy:  09/10/15 Polyps  BMD:   never TDaP:  2010 Zostavax:  completed Screening Labs: PCP, Urine today: PCP   reports that she has never smoked. She has never used smokeless tobacco. She reports that she drinks about 1.2 oz of alcohol per week. She reports that she does not use illicit drugs.  Past Medical History  Diagnosis Date  . Hypertension   . Carcinoid tumor of lung 2012    right found incidentally on chest xray eval for r/o vasxculitis   . 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 HNP (herniated nucleus pulposus), cervical 01/22/2011    C6-C7  Has seen dr Vertell Limber for this and Dr Tonita Cong  Positional numbness in hands no weakness    . Hypothyroidism   . Endometrial cells on cervical Pap smear inconsistent w/LMP 6/11    benign proliferative endometrium on endometrial biopsy  . SUI (stress urinary incontinence, female)     h/o  . Dysrhythmia     irregular occassional, no current problem  . Double vision     history, no current problem  . Sleep apnea     uses CPAP nightly  . Arthritis     knees, back    Past Surgical History  Procedure Laterality Date  . Cesarean section      x 1 - twins  . Shoulder open rotator cuff repair  07/20/2010    Right  . Lung tumor removed      Rt lung carcinoid  . Dilatation & currettage/hysteroscopy with resectocope N/A 07/18/2013    Procedure: DILATATION & CURETTAGE/HYSTEROSCOPY WITH RESECTOCOPE;  Surgeon: Lyman Speller, MD;  Location: [facility];  Service: Gynecology;  Laterality:  N/A;  mass resection  . Colonoscopy  07/31/05    brodie - polyps and bx's  . Wisdom tooth extraction      Current Outpatient Prescriptions  Medication Sig Dispense Refill  . amLODipine (NORVASC) 5 MG tablet Take 1 tablet (5 mg total) by mouth daily. 90 tablet 3  . aspirin 81 MG tablet Take 81 mg by mouth daily.    [name] CALCIUM PO Take by mouth 2 (two) times daily.    . Cholecalciferol (VITAMIN D) 2000 UNITS CAPS Take by mouth.    . clobetasol cream (TEMOVATE) 0.05 % as needed.     . methocarbamol (ROBAXIN) 750 MG tablet 1-2 po tid  PO for muscle spasm 60 tablet 1  . Multiple Vitamins-Minerals (EYE VITAMINS PO) Take 1 capsule by mouth 2 (two) times daily. "Eye caps"    . Multiple Vitamins-Minerals (MULTIVITAMIN PO) Take 1 tablet by mouth daily.     [name] nystatin (MYCOSTATIN/NYSTOP) 100000 UNIT/GM POWD Apply topically as needed.    . Omega-3 Fatty Acids (FISH OIL PO) Take 1 capsule by mouth daily.     [name] SYNTHROID 137 MCG tablet TAKE 1 TABLET DAILY 90 tablet 0  . TOPROL XL 100 MG 24 hr tablet TAKE 1 TABLET DAILY WITH OR IMMEDIATELY FOLLOWING A MEAL. 90 tablet  0  . triamcinolone ointment (KENALOG) 0.5 % Apply 1 application topically 2 (two) times daily. 30 g 1  . triamterene-hydrochlorothiazide (MAXZIDE-25) 37.5-25 MG tablet TAKE 1 TABLET DAILY 90 tablet 0   No current facility-administered medications for this visit.    Family History  Problem Relation Age of Onset  . Stroke Mother     died age 84  . Hypertension Mother   . Coronary artery disease Father     died age 61  . Hypertension Father   . Hypertension Maternal Uncle   . Parkinson's disease Brother   . Diabetes Brother   . Hypertension Brother     x2  . Colon cancer Neg Hx   . Rectal cancer Neg Hx   . Stomach cancer Neg Hx     ROS:  Pertinent items are noted in HPI.  Otherwise, a comprehensive ROS was negative.  Exam:   BP 124/70 mmHg  Pulse 66  Resp 16  Ht 4' 11.25" (1.505 m)  Wt 216 lb (97.977 kg)  BMI 43.26 kg/m2   LMP 02/12/2011  Weight change: -12#  Height: 4' 11.25" (150.5 cm)  Ht Readings from Last 3 Encounters:  03/04/16 4' 11.25" (1.505 m)  10/24/15 4' 11.75" (1.518 m)  09/12/15 4' 11.75" (1.518 m)    General appearance: alert, cooperative and appears stated age Head: Normocephalic, without obvious abnormality, atraumatic Neck: no adenopathy, supple, symmetrical, trachea midline and thyroid normal to inspection and palpation Lungs: clear to auscultation bilaterally Breasts: normal appearance, no masses or tenderness Heart: regular rate and rhythm Abdomen: soft, non-tender; bowel sounds normal; no masses,  no organomegaly Extremities: extremities normal, atraumatic, no cyanosis or edema Skin: Skin color, texture, turgor normal. No rashes or lesions Lymph nodes: Cervical, supraclavicular, and axillary nodes normal. No abnormal inguinal nodes palpated Neurologic: Grossly normal   Pelvic: External genitalia:  no lesions              Urethra:  normal appearing urethra with no masses, tenderness or lesions              Bartholins and Skenes: normal                 Vagina: normal appearing vagina with normal color and discharge, no lesions              Cervix: no lesions              Pap taken: No. Bimanual Exam:  Uterus:  normal size, contour, position, consistency, mobility, non-tender              Adnexa: normal adnexa and no mass, fullness, tenderness               Rectovaginal: Confirms               Anus:  normal sphincter tone, no lesions  Chaperone was present for exam.  A:  Well Woman with normal exam Obesity Hypothyroidism H/O SUI H/O carcinoid tumor s/p lobectomy 7/12 OSA on Cpap Polycythemia Vera. Being followed by Dr. Alvy Bimler Vulvar itching H/o hysteroscopy with resection of myoma 8/14  P: Mammogram yearly Pap 7/14 with endometrial cells. Neg HR HPV testing 2013.  Neg pap 2016.  No pap today. Changes in topical products such as toilet papers, detergents,  softeners, minipads discussed. As there is not visible skin changes, will continue to folllow. Triamcinolone ointment 0.5% up to BID x 7 days, or less. Rx to pharmacy. Directions for usage given. Nystatin powder  prn but under skin folds sent to pharmacy. F/U 1 year or prn

## 2016-03-05 ENCOUNTER — Other Ambulatory Visit (INDEPENDENT_AMBULATORY_CARE_PROVIDER_SITE_OTHER)

## 2016-03-05 DIAGNOSIS — E669 Obesity, unspecified: Secondary | ICD-10-CM | POA: Diagnosis not present

## 2016-03-05 DIAGNOSIS — E039 Hypothyroidism, unspecified: Secondary | ICD-10-CM

## 2016-03-05 DIAGNOSIS — D582 Other hemoglobinopathies: Secondary | ICD-10-CM | POA: Diagnosis not present

## 2016-03-05 DIAGNOSIS — Z1159 Encounter for screening for other viral diseases: Secondary | ICD-10-CM

## 2016-03-05 DIAGNOSIS — I10 Essential (primary) hypertension: Secondary | ICD-10-CM

## 2016-03-05 DIAGNOSIS — Z791 Long term (current) use of non-steroidal anti-inflammatories (NSAID): Secondary | ICD-10-CM

## 2016-03-05 LAB — CBC WITH DIFFERENTIAL/PLATELET
BASOS ABS: 0 10*3/uL (ref 0.0–0.1)
Basophils Relative: 0.6 % (ref 0.0–3.0)
EOS ABS: 0.2 10*3/uL (ref 0.0–0.7)
Eosinophils Relative: 2.3 % (ref 0.0–5.0)
HCT: 46.4 % — ABNORMAL HIGH (ref 36.0–46.0)
Hemoglobin: 15.7 g/dL — ABNORMAL HIGH (ref 12.0–15.0)
Lymphocytes Relative: 28.2 % (ref 12.0–46.0)
Lymphs Abs: 2.5 10*3/uL (ref 0.7–4.0)
MCHC: 33.8 g/dL (ref 30.0–36.0)
MCV: 89.7 fl (ref 78.0–100.0)
MONO ABS: 0.7 10*3/uL (ref 0.1–1.0)
Monocytes Relative: 7.9 % (ref 3.0–12.0)
NEUTROS ABS: 5.4 10*3/uL (ref 1.4–7.7)
NEUTROS PCT: 61 % (ref 43.0–77.0)
PLATELETS: 251 10*3/uL (ref 150.0–400.0)
RBC: 5.17 Mil/uL — ABNORMAL HIGH (ref 3.87–5.11)
RDW: 12.9 % (ref 11.5–15.5)
WBC: 8.8 10*3/uL (ref 4.0–10.5)

## 2016-03-05 LAB — BASIC METABOLIC PANEL
BUN: 25 mg/dL — AB (ref 6–23)
CALCIUM: 9.3 mg/dL (ref 8.4–10.5)
CO2: 32 mEq/L (ref 19–32)
CREATININE: 0.71 mg/dL (ref 0.40–1.20)
Chloride: 101 mEq/L (ref 96–112)
GFR: 88.8 mL/min (ref >60.00)
GLUCOSE: 95 mg/dL (ref 70–99)
POTASSIUM: 4.1 meq/L (ref 3.5–5.1)
SODIUM: 140 meq/L (ref 135–145)

## 2016-03-05 LAB — LIPID PANEL
CHOL/HDL RATIO: 3
CHOLESTEROL: 145 mg/dL (ref 0–200)
HDL: 47.1 mg/dL (ref >39.00)
LDL CALC: 86 mg/dL (ref 0–99)
NonHDL: 98.01
Triglycerides: 58 mg/dL (ref 0.0–149.0)
VLDL: 11.6 mg/dL (ref 0.0–40.0)

## 2016-03-05 LAB — HEPATIC FUNCTION PANEL
ALT: 18 U/L (ref 0–35)
AST: 21 U/L (ref 0–37)
Albumin: 3.9 g/dL (ref 3.5–5.2)
Alkaline Phosphatase: 61 U/L (ref 39–117)
BILIRUBIN TOTAL: 0.5 mg/dL (ref 0.2–1.2)
Bilirubin, Direct: 0.1 mg/dL (ref 0.0–0.3)
TOTAL PROTEIN: 7.2 g/dL (ref 6.0–8.3)

## 2016-03-05 LAB — TSH: TSH: 2.31 u[IU]/mL (ref 0.35–4.50)

## 2016-03-06 LAB — HEPATITIS C ANTIBODY: HCV Ab: NEGATIVE

## 2016-03-10 MED ORDER — NYSTATIN 100000 UNIT/GM EX POWD: CUTANEOUS | Status: DC

## 2016-03-10 MED ORDER — TRIAMCINOLONE ACETONIDE 0.5 % EX OINT: 1.0000 "application " | TOPICAL_OINTMENT | Freq: Two times a day (BID) | CUTANEOUS | Status: DC

## 2016-03-11 ENCOUNTER — Encounter: Payer: Self-pay | Admitting: Internal Medicine

## 2016-03-12 NOTE — Telephone Encounter (Signed)
Not too concerned cause  You had initial hem evaluation.  Can discuss more  At your visit .  No evidence of congenital heart disease

## 2016-03-27 ENCOUNTER — Other Ambulatory Visit: Payer: Self-pay | Admitting: Internal Medicine

## 2016-03-28 NOTE — Telephone Encounter (Signed)
Sent in

## 2016-03-31 ENCOUNTER — Encounter: Payer: Self-pay | Admitting: Pulmonary Disease

## 2016-03-31 DIAGNOSIS — G4733 Obstructive sleep apnea (adult) (pediatric): Secondary | ICD-10-CM

## 2016-04-01 NOTE — Telephone Encounter (Signed)
Dr. Halford Chessman, please advise on Mychart message.

## 2016-04-02 NOTE — Telephone Encounter (Signed)
Please order ONO with CPAP.

## 2016-04-07 ENCOUNTER — Encounter: Payer: Self-pay | Admitting: Pulmonary Disease

## 2016-04-07 ENCOUNTER — Ambulatory Visit (INDEPENDENT_AMBULATORY_CARE_PROVIDER_SITE_OTHER): Admitting: Pulmonary Disease

## 2016-04-07 VITALS — BP 118/76 | HR 71 | Ht 60.0 in | Wt 215.0 lb

## 2016-04-07 DIAGNOSIS — D582 Other hemoglobinopathies: Secondary | ICD-10-CM | POA: Diagnosis not present

## 2016-04-07 DIAGNOSIS — Z6841 Body Mass Index (BMI) 40.0 and over, adult: Secondary | ICD-10-CM

## 2016-04-07 DIAGNOSIS — G4733 Obstructive sleep apnea (adult) (pediatric): Secondary | ICD-10-CM

## 2016-04-07 DIAGNOSIS — Z9989 Dependence on other enabling machines and devices: Principal | ICD-10-CM

## 2016-04-07 NOTE — Patient Instructions (Signed)
Will call with results of overnight oxygen test  Follow up in 1 year 

## 2016-04-07 NOTE — Progress Notes (Addendum)
Current Outpatient Prescriptions on File Prior to Visit  Medication Sig  . amLODipine (NORVASC) 5 MG tablet TAKE 1 TABLET DAILY  . aspirin 81 MG tablet Take 81 mg by mouth daily.  [name] CALCIUM PO Take by mouth 2 (two) times daily.  . Cholecalciferol (VITAMIN D) 2000 UNITS CAPS Take by mouth.  . methocarbamol (ROBAXIN) 750 MG tablet 1-2 po tid  PO for muscle spasm  . Multiple Vitamins-Minerals (EYE VITAMINS PO) Take 1 capsule by mouth 2 (two) times daily. "Eye caps"  . Multiple Vitamins-Minerals (MULTIVITAMIN PO) Take 1 tablet by mouth daily.   [name] nystatin (MYCOSTATIN/NYSTOP) 100000 UNIT/GM POWD Apply twice daily as needed  . Omega-3 Fatty Acids (FISH OIL PO) Take 1 capsule by mouth daily.   [name] SYNTHROID 137 MCG tablet TAKE 1 TABLET DAILY  . TOPROL XL 100 MG 24 hr tablet TAKE 1 TABLET DAILY WITH OR IMMEDIATELY FOLLOWING A MEAL.  [name] triamcinolone ointment (KENALOG) 0.5 % Apply 1 application topically 2 (two) times daily. Can use for up to 7 days with flare.  . triamterene-hydrochlorothiazide (MAXZIDE-25) 37.5-25 MG tablet TAKE 1 TABLET DAILY   No current facility-administered medications on file prior to visit.     Chief Complaint  Patient presents with  . Follow-up    Former [facility] patient: Wears CPAP nightly. Denies any problems with mask/pressure. DME: [facility].      Tests PSG 07/10/14 >> AHI 85 Echo 05/26/14 >> EF 65 to 70% Auto CPAP 03/05/16 to 04/03/16 >> used on 30 of 30 nights with average 7 hrs 39 min.  Average AHI 2.3 with median CPAP 10 and 95 th percentile CPAP 12 cm H2O  Past medical hx HTN, Hypothyroidism, Rt lung carcinoid 2012  Past surgical hx, Allergies, Family hx, Social hx all reviewed.  Vital Signs BP 118/76 mmHg  Pulse 71  Ht 5' (1.524 m)  Wt 215 lb (97.523 kg)  BMI 41.99 kg/m2  SpO2 97%  LMP 02/12/2011  History of Present Illness Nichole Smith is a 60 y.o. female with obstructive sleep apnea.  She has been doing well with CPAP.  She has nasal mask.  This fits  better than full face mask.  She is concerned about still having a high hemoglobin.  She had ONO with CPAP done last week >> results still pending.  Physical Exam  General - No distress ENT - No sinus tenderness, no oral exudate, no LAN, MP 3, 3+ tonsils Cardiac - s1s2 regular, no murmur Chest - No wheeze/rales/dullness Back - No focal tenderness Abd - Soft, non-tender Ext - No edema Neuro - Normal strength Skin - No rashes Psych - normal mood, and behavior   Assessment/Plan  Obstructive sleep apnea. - continue auto CPAP  - will call her with results of ONO >> if she has oxygen desaturation, then she would need in lab titration study  Obesity. - discussed importance of weight loss  Elevated hemoglobin. - will need further assess if her ONO is abnormal   Patient Instructions  Will call with results of overnight oxygen test  Follow up in 1 year     Chesley Mires, MD [facility] Pager:  [phone] 04/07/2016, 3:18 PM

## 2016-04-10 ENCOUNTER — Encounter: Payer: Self-pay | Admitting: Internal Medicine

## 2016-04-10 ENCOUNTER — Ambulatory Visit (INDEPENDENT_AMBULATORY_CARE_PROVIDER_SITE_OTHER): Admitting: Internal Medicine

## 2016-04-10 VITALS — BP 126/80 | HR 65 | Temp 97.9°F | Ht 60.0 in | Wt 214.3 lb

## 2016-04-10 DIAGNOSIS — I10 Essential (primary) hypertension: Secondary | ICD-10-CM | POA: Diagnosis not present

## 2016-04-10 DIAGNOSIS — M542 Cervicalgia: Secondary | ICD-10-CM | POA: Diagnosis not present

## 2016-04-10 DIAGNOSIS — M436 Torticollis: Secondary | ICD-10-CM | POA: Diagnosis not present

## 2016-04-10 DIAGNOSIS — R221 Localized swelling, mass and lump, neck: Secondary | ICD-10-CM

## 2016-04-10 MED ORDER — CYCLOBENZAPRINE HCL 5 MG PO TABS: 5.0000 mg | ORAL_TABLET | Freq: Three times a day (TID) | ORAL | Status: DC | PRN

## 2016-04-10 NOTE — Progress Notes (Signed)
Chief Complaint  Patient presents with  . Neck Pain    pt states she has pain in the left side of her neck, pain started about 3 months ago, about 3 weeks ago noticed a "knot" in the area      HPI: Nichole Smith 60 y.o.  Here for sda  New problem appt  Pain left side for months and now swelling for a few weeks and not  anywhere .    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  Self rx:   Aleve ocass and muscl relax  Massage not that  Helpful  Still going  robaxin rx old  Can she try another MR?    bp low 100 range at home  Ha lost weight on weight watchers    Diuretic frequency also   No other change    Hx of mri x 2 neck a while bacl ROS: See pertinent positives and negatives per HPI.  Past Medical History  Diagnosis Date  . Hypertension   . Carcinoid tumor of lung 2012    right found incidentally on chest xray eval for r/o vasxculitis   . 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 HNP (herniated nucleus pulposus), cervical 01/22/2011    C6-C7  Has seen dr Vertell Limber for this and Dr Tonita Cong  Positional numbness in hands no weakness    . Hypothyroidism   . Endometrial cells on cervical Pap smear inconsistent w/LMP 6/11    benign proliferative endometrium on endometrial biopsy  . SUI (stress urinary incontinence, female)     h/o  . Dysrhythmia     irregular occassional, no current problem  . Double vision     history, no current problem  . Sleep apnea     uses CPAP nightly  . Arthritis     knees, back    Family History  Problem Relation Age of Onset  . Stroke Mother     died age 32  . Hypertension Mother   . Coronary artery disease Father     died age 56  . Hypertension Father   . Hypertension Maternal Uncle   . Parkinson's disease Brother   . Diabetes Brother   . Hypertension Brother     x2  . Colon cancer Neg Hx   . Rectal cancer Neg Hx   . Stomach cancer Neg Hx     Social History   Social History  .  Marital Status: Married    Spouse Name: N/A  . Number of Children: 3  . Years of Education: N/A   Occupational History  . retied    Social History Main Topics  . Smoking status: Never Smoker   . Smokeless tobacco: Never Used  . Alcohol Use: 1.2 oz/week    2 Glasses of wine per week  . Drug Use: No  . Sexual Activity:    Partners: Male    Birth Control/ Protection: None, Post-menopausal   Other Topics Concern  . None   Social History Narrative   hhof 2    2 Children at college and  beyond      [name] full time   Married    Retired age 54 2015    Outpatient Prescriptions Prior to Visit  Medication Sig Dispense Refill  . amLODipine (NORVASC) 5 MG tablet TAKE 1 TABLET DAILY 90 tablet 2  . aspirin 81 MG tablet Take 81 mg by  mouth daily.    [name] CALCIUM PO Take by mouth 2 (two) times daily.    . Cholecalciferol (VITAMIN D) 2000 UNITS CAPS Take by mouth.    . methocarbamol (ROBAXIN) 750 MG tablet 1-2 po tid  PO for muscle spasm 60 tablet 1  . Multiple Vitamins-Minerals (EYE VITAMINS PO) Take 1 capsule by mouth 2 (two) times daily. "Eye caps"    . Multiple Vitamins-Minerals (MULTIVITAMIN PO) Take 1 tablet by mouth daily.     [name] nystatin (MYCOSTATIN/NYSTOP) 100000 UNIT/GM POWD Apply twice daily as needed 15 g 3  . Omega-3 Fatty Acids (FISH OIL PO) Take 1 capsule by mouth daily.     [name] SYNTHROID 137 MCG tablet TAKE 1 TABLET DAILY 90 tablet 0  . TOPROL XL 100 MG 24 hr tablet TAKE 1 TABLET DAILY WITH OR IMMEDIATELY FOLLOWING A MEAL. 90 tablet 0  . triamcinolone ointment (KENALOG) 0.5 % Apply 1 application topically 2 (two) times daily. Can use for up to 7 days with flare. 60 g 3  . triamterene-hydrochlorothiazide (MAXZIDE-25) 37.5-25 MG tablet TAKE 1 TABLET DAILY 90 tablet 0   No facility-administered medications prior to visit.     EXAM:  BP 126/80 mmHg  Pulse 65  Temp(Src) 97.9 F (36.6 C) (Oral)  Ht 5' (1.524 m)  Wt 214 lb 5 oz (97.212 kg)  BMI 41.86 kg/m2  SpO2  96%  LMP 02/12/2011  Body mass index is 41.86 kg/(m^2).  GENERAL: vitals reviewed and listed above, alert, oriented, appears well hydrated and in no acute distress HEENT: atraumatic, conjunctiva  clear, no obvious abnormalities on inspection of external nose and ears OP : no lesion edema or exudate  NECK:  Supple except latera to let eels stiff no midline point tendernes ss left lateral  post scm a fullness area about 2 cm  Cannot tell if a mass or spasm   No hardness or focal tenderness scalp is clear  LUNGS: clear to auscultation bilaterally, no wheezes, rales or rhonchi,  CV: HRRR, no clubbing cyanosis or  peripheral edema nl cap refill  MS: moves all extremities without noticeable focal  abnormality PSYCH: pleasant and cooperative, no obvious depression or anxiety Lab Results  Component Value Date   WBC 8.8 03/05/2016   HGB 15.7* 03/05/2016   HCT 46.4* 03/05/2016   PLT 251.0 03/05/2016   GLUCOSE 95 03/05/2016   CHOL 145 03/05/2016   TRIG 58.0 03/05/2016   HDL 47.10 03/05/2016   LDLCALC 86 03/05/2016   ALT 18 03/05/2016   AST 21 03/05/2016   NA 140 03/05/2016   K 4.1 03/05/2016   CL 101 03/05/2016   CREATININE 0.71 03/05/2016   BUN 25* 03/05/2016   CO2 32 03/05/2016   TSH 2.31 03/05/2016   INR 0.96 06/11/2011    ASSESSMENT AND PLAN:  Discussed the following assessment and plan:  Neck pain on left side  Localized swelling, mass and lump, neck? vs spasm - optinos discussed  rechek consider ent check vs ct neck  has djd in neck and has sx of this w musc spasm  Neck stiffness  Essential hypertension - dec readings with weight loss  dec to 1/2 maxide for now and fu in 2 months or thereabouts mychart comm if needed  Morbid obesity, unspecified obesity type ([place]) Encouraged to continue with weight watchers -Patient advised to return or notify health care team  if symptoms worsen ,persist or new concerns arise.  Risk benefit of medication discussed.  Patient  Instructions  Uncertain of lump areas is muscle spasm or  A lump from other cause such as a lymph node or cyst.  Can try a different muscle   Relaxer at night .  Decrease  diuretic pill to 1/2 pill. Per day   Plan ROV  In 2  months .  For bp management .    And recheck neck . ? Image   Ct scan or other .      Standley Brooking. Cheryln Balcom M.D.

## 2016-04-10 NOTE — Patient Instructions (Signed)
Uncertain of lump areas is muscle spasm or  A lump from other cause such as a lymph node or cyst.  Can try a different muscle   Relaxer at night .  Decrease  diuretic pill to 1/2 pill. Per day   Plan ROV  In 2  months .  For bp management .    And recheck neck . ? Image   Ct scan or other .

## 2016-04-10 NOTE — Progress Notes (Signed)
Pre visit review using our clinic review tool, if applicable. No additional management support is needed unless otherwise documented below in the visit note. 

## 2016-04-21 ENCOUNTER — Encounter: Payer: Self-pay | Admitting: Pulmonary Disease

## 2016-04-21 DIAGNOSIS — G4733 Obstructive sleep apnea (adult) (pediatric): Secondary | ICD-10-CM

## 2016-04-21 NOTE — Telephone Encounter (Signed)
Pt requesting ONO results.   Results being faxed to office by [facility].   Will hold message until results are received.

## 2016-04-23 NOTE — Telephone Encounter (Signed)
ONO with CPAP 04/04/16 >> Test time 5 hrs 34 min.  Basal SpO2 89.6%. Low SpO2 68%.  Spent 130.1 min with SpO2 < 88%.   Will have my nurse inform pt that ONO showed low oxygen level even while using CPAP.  She needs to have in lab titration study to determine optimal settings for sleep apnea therapy.  If patient is agreeable, then please send order for in lab CPAP titration study.

## 2016-04-29 NOTE — Telephone Encounter (Signed)
Replied to patient e-mail with results.  Will await response from patient to determine whether or not we are ordering the titration study.

## 2016-05-07 ENCOUNTER — Other Ambulatory Visit: Payer: Self-pay | Admitting: Internal Medicine

## 2016-05-07 ENCOUNTER — Encounter: Payer: Self-pay | Admitting: Internal Medicine

## 2016-05-08 NOTE — Telephone Encounter (Signed)
Rx refill sent to pharmacy. 

## 2016-05-12 ENCOUNTER — Other Ambulatory Visit: Payer: Self-pay | Admitting: Family Medicine

## 2016-05-12 DIAGNOSIS — R221 Localized swelling, mass and lump, neck: Secondary | ICD-10-CM

## 2016-05-12 NOTE — Telephone Encounter (Signed)
Please  Order  Neck ct scan  Dx  Left neck lump.  Then Fu after results .

## 2016-05-23 ENCOUNTER — Inpatient Hospital Stay: Admission: RE | Admit: 2016-05-23 | Source: Ambulatory Visit

## 2016-05-26 ENCOUNTER — Encounter: Payer: Self-pay | Admitting: Pulmonary Disease

## 2016-05-30 ENCOUNTER — Ambulatory Visit
Admission: RE | Admit: 2016-05-30 | Discharge: 2016-05-30 | Disposition: A | Discharge status: Home / Self Care | Source: Ambulatory Visit | Attending: Internal Medicine | Admitting: Internal Medicine

## 2016-05-30 DIAGNOSIS — R221 Localized swelling, mass and lump, neck: Secondary | ICD-10-CM

## 2016-05-30 MED ORDER — IOPAMIDOL (ISOVUE-300) INJECTION 61%
75.0000 mL | Freq: Once | INTRAVENOUS | Status: AC | PRN
  Administered 2016-05-30: 75 mL via INTRAVENOUS

## 2016-06-01 ENCOUNTER — Ambulatory Visit (HOSPITAL_BASED_OUTPATIENT_CLINIC_OR_DEPARTMENT_OTHER): Attending: Pulmonary Disease | Admitting: Pulmonary Disease

## 2016-06-01 VITALS — Ht 60.0 in | Wt 208.0 lb

## 2016-06-01 DIAGNOSIS — I493 Ventricular premature depolarization: Secondary | ICD-10-CM | POA: Diagnosis not present

## 2016-06-01 DIAGNOSIS — G4733 Obstructive sleep apnea (adult) (pediatric): Secondary | ICD-10-CM | POA: Diagnosis present

## 2016-06-01 DIAGNOSIS — G4761 Periodic limb movement disorder: Secondary | ICD-10-CM | POA: Insufficient documentation

## 2016-06-10 ENCOUNTER — Encounter: Payer: Self-pay | Admitting: Internal Medicine

## 2016-06-10 ENCOUNTER — Ambulatory Visit (INDEPENDENT_AMBULATORY_CARE_PROVIDER_SITE_OTHER): Admitting: Internal Medicine

## 2016-06-10 VITALS — BP 128/68 | HR 68 | Temp 98.0°F | Ht 60.0 in | Wt 210.2 lb

## 2016-06-10 DIAGNOSIS — I10 Essential (primary) hypertension: Secondary | ICD-10-CM

## 2016-06-10 DIAGNOSIS — G4733 Obstructive sleep apnea (adult) (pediatric): Secondary | ICD-10-CM | POA: Diagnosis not present

## 2016-06-10 DIAGNOSIS — I7090 Unspecified atherosclerosis: Secondary | ICD-10-CM

## 2016-06-10 DIAGNOSIS — R221 Localized swelling, mass and lump, neck: Secondary | ICD-10-CM

## 2016-06-10 DIAGNOSIS — D582 Other hemoglobinopathies: Secondary | ICD-10-CM | POA: Diagnosis not present

## 2016-06-10 NOTE — Progress Notes (Signed)
Chief Complaint  Patient presents with  . Follow-up    HPI: Nichole Smith 60 y.o.  Comes inf fu of neck scan  For poss lump ln   Has bp monitor and readings  Very good 120 and below range  Is exerciseing and doing weight watchers.  On osa rx but still concerned that hg is elevated and had another sleep eval  Results pending checking for hypoxia .  Incidental finding  On neck ct plaque  On coronary's and aorta and into bifurcation  Carotids .   Mom passed from cva tobacco and  Untreated ht . Age 89  ? What to do  If needs follow up lipids have always been good   fatige at times  Pulse never high even with exercise   ROS: See pertinent positives and negatives per HPI.  Past Medical History  Diagnosis Date  . Hypertension   . Carcinoid tumor of lung 2012    right found incidentally on chest xray eval for r/o vasxculitis   . 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 HNP (herniated nucleus pulposus), cervical 01/22/2011    C6-C7  Has seen dr Vertell Limber for this and Dr Tonita Cong  Positional numbness in hands no weakness    . Hypothyroidism   . Endometrial cells on cervical Pap smear inconsistent w/LMP 6/11    benign proliferative endometrium on endometrial biopsy  . SUI (stress urinary incontinence, female)     h/o  . Dysrhythmia     irregular occassional, no current problem  . Double vision     history, no current problem  . Sleep apnea     uses CPAP nightly  . Arthritis     knees, back    Family History  Problem Relation Age of Onset  . Stroke Mother     died age 41  . Hypertension Mother   . Coronary artery disease Father     died age 9  . Hypertension Father   . Hypertension Maternal Uncle   . Parkinson's disease Brother   . Diabetes Brother   . Hypertension Brother     x2  . Colon cancer Neg Hx   . Rectal cancer Neg Hx   . Stomach cancer Neg Hx     Social History   Social History  . Marital Status: Married    Spouse Name: N/A  . Number of Children: 3    . Years of Education: N/A   Occupational History  . retied    Social History Main Topics  . Smoking status: Never Smoker   . Smokeless tobacco: Never Used  . Alcohol Use: 1.2 oz/week    2 Glasses of wine per week  . Drug Use: No  . Sexual Activity:    Partners: Male    Birth Control/ Protection: None, Post-menopausal   Other Topics Concern  . None   Social History Narrative   hhof 2    2 Children at college and  beyond      [name] full time   Married    Retired age 21 2015    Outpatient Prescriptions Prior to Visit  Medication Sig Dispense Refill  . amLODipine (NORVASC) 5 MG tablet TAKE 1 TABLET DAILY 90 tablet 2  . aspirin 81 MG tablet Take 81 mg by mouth daily.    [name] CALCIUM PO Take by mouth 2 (two) times daily.    . Cholecalciferol (VITAMIN D) 2000 UNITS CAPS Take by mouth.    . cyclobenzaprine (  FLEXERIL) 5 MG tablet Take 1 tablet (5 mg total) by mouth 3 (three) times daily as needed for muscle spasms. 30 tablet 1  . methocarbamol (ROBAXIN) 750 MG tablet 1-2 po tid  PO for muscle spasm 60 tablet 1  . Multiple Vitamins-Minerals (EYE VITAMINS PO) Take 1 capsule by mouth 2 (two) times daily. "Eye caps"    . Multiple Vitamins-Minerals (MULTIVITAMIN PO) Take 1 tablet by mouth daily.     [name] nystatin (MYCOSTATIN/NYSTOP) 100000 UNIT/GM POWD Apply twice daily as needed 15 g 3  . Omega-3 Fatty Acids (FISH OIL PO) Take 1 capsule by mouth daily.     [name] SYNTHROID 137 MCG tablet TAKE 1 TABLET DAILY 90 tablet 1  . TOPROL XL 100 MG 24 hr tablet TAKE 1 TABLET DAILY WITH OR IMMEDIATELY FOLLOWING A MEAL. 90 tablet 1  . triamcinolone ointment (KENALOG) 0.5 % Apply 1 application topically 2 (two) times daily. Can use for up to 7 days with flare. 60 g 3  . triamterene-hydrochlorothiazide (MAXZIDE-25) 37.5-25 MG tablet TAKE 1 TABLET DAILY 90 tablet 1   No facility-administered medications prior to visit.     EXAM:  BP 128/68 mmHg  Pulse 68  Temp(Src) 98 F (36.7 C) (Oral)  Ht  5' (1.524 m)  Wt 210 lb 3.2 oz (95.346 kg)  BMI 41.05 kg/m2  SpO2 95%  LMP 02/12/2011  Body mass index is 41.05 kg/(m^2).  GENERAL: vitals reviewed and listed above, alert, oriented, appears well hydrated and in no acute distress Of tonsil 1+ no edema exudate  .  NECK: no bruits heard  No ac nodes obvious LUNGS: clear to auscultation bilaterally, no wheezes, rales or rhonchi, good air movement CV: HRRR, no clubbing cyanosis or  peripheral edema nl cap refill  MS: moves all extremities without noticeable focal  abnormality PSYCH: pleasant and cooperative, no obvious depression or anxiety  bp log and machine correlates  Lab Results  Component Value Date   WBC 8.8 03/05/2016   HGB 15.7* 03/05/2016   HCT 46.4* 03/05/2016   PLT 251.0 03/05/2016   GLUCOSE 95 03/05/2016   CHOL 145 03/05/2016   TRIG 58.0 03/05/2016   HDL 47.10 03/05/2016   LDLCALC 86 03/05/2016   ALT 18 03/05/2016   AST 21 03/05/2016   NA 140 03/05/2016   K 4.1 03/05/2016   CL 101 03/05/2016   CREATININE 0.71 03/05/2016   BUN 25* 03/05/2016   CO2 32 03/05/2016   TSH 2.31 03/05/2016   INR 0.96 06/11/2011   Wt Readings from Last 3 Encounters:  06/10/16 210 lb 3.2 oz (95.346 kg)  06/01/16 208 lb (94.348 kg)  04/10/16 214 lb 5 oz (97.212 kg)   BP Readings from Last 3 Encounters:  06/10/16 128/68  04/10/16 126/80  04/07/16 118/76     ASSESSMENT AND PLAN:  Discussed the following assessment and plan:  Essential hypertension - Plan: Ambulatory referral to Cardiology  Atherosclerotic plaque - Plan: Ambulatory referral to Cardiology  Elevated hemoglobin (HCC)  Obstructive sleep apnea  Localized swelling, mass and lump, neck? vs spasm - benign appearing Ln see scan   Total visit 14mns > 50% spent counseling and coordinating care as indicated in above note and in instructions to patient .   -Patient advised to return or notify health care team  if symptoms worsen ,persist or new concerns  arise.  Patient Instructions  bp control   Continue   Can try dec metoprolol to 50 mg per day  And make sure  Control bp .  May help fatigue .  Also consdieration of seeing cardiology  About vascular risk assessment . Consideration of  Carotid  Vascular study  .  But  intervenetion is  Usually  Statin meds and  Life style changes .   Will refer.   Send in bp readings  After a month   Of dec  metoprolol .   ROV depending on  bp and at wellness check up.            Standley Brooking. Anaka Beazer M.D.

## 2016-06-10 NOTE — Patient Instructions (Addendum)
bp control   Continue   Can try dec metoprolol to 50 mg per day  And make sure  Control bp .  May help fatigue .  Also consdieration of seeing cardiology  About vascular risk assessment . Consideration of  Carotid  Vascular study  .  But  intervenetion is  Usually  Statin meds and  Life style changes .   Will refer.   Send in bp readings  After a month   Of dec  metoprolol .   ROV depending on  bp and at wellness check up.

## 2016-06-10 NOTE — Progress Notes (Signed)
Pre visit review using our clinic review tool, if applicable. No additional management support is needed unless otherwise documented below in the visit note. 

## 2016-06-11 DIAGNOSIS — I7090 Unspecified atherosclerosis: Secondary | ICD-10-CM | POA: Insufficient documentation

## 2016-06-11 NOTE — Assessment & Plan Note (Signed)
On b blocker for palpitations  And ht  But reasonable to dec dose  And monitor control cont exercise

## 2016-06-18 ENCOUNTER — Telehealth: Payer: Self-pay | Admitting: Pulmonary Disease

## 2016-06-18 DIAGNOSIS — G4761 Periodic limb movement disorder: Secondary | ICD-10-CM

## 2016-06-18 DIAGNOSIS — G4733 Obstructive sleep apnea (adult) (pediatric): Secondary | ICD-10-CM

## 2016-06-18 DIAGNOSIS — Z9989 Dependence on other enabling machines and devices: Secondary | ICD-10-CM

## 2016-06-18 NOTE — Telephone Encounter (Signed)
CPAP titration 06/01/16 >> CPAP 14 >> didn't need supplemental oxygen.  Will have my nurse inform pt that she did well with CPAP 14 cm H2O.  At this pressure she did not need supplemental oxygen.  This is a higher pressure than she was getting using auto CPAP.  I have sent order to have her DME change CPAP to 14 cm H2O.  She should call back if she has difficulty sleeping after pressure change.

## 2016-06-18 NOTE — Procedures (Signed)
Patient Name: Nichole Smith, Nichole Smith Date: 06/01/2016 Gender: Female D.O.B: Sep 03, 1956 Age (years): 40 Referring Provider: Chesley Mires MD, ABSM Height (inches): 60 Interpreting Physician: Chesley Mires MD, ABSM Weight (lbs): 208 RPSGT: Madelon Lips BMI: 41 MRN: [id number] Neck Size: 15.00  CLINICAL INFORMATION 60 yr old female with hx of obstructive sleep apnea.  She has been on auto CPAP.  She was found to have elevated hemoglobin, and overnight oximetry showed oxygen desaturation with CPAP.  She is referred for a titration study.   Date of NPSG, Split Night or HST: 07/10/14 >> AHI 85.  SLEEP STUDY TECHNIQUE As per the AASM Manual for the Scoring of Sleep and Associated Events v2.3 (April 2016) with a hypopnea requiring 4% desaturations. The channels recorded and monitored were frontal, central and occipital EEG, electrooculogram (EOG), submentalis EMG (chin), nasal and oral airflow, thoracic and abdominal wall motion, anterior tibialis EMG, snore microphone, electrocardiogram, and pulse oximetry. Continuous positive airway pressure (CPAP) was initiated at the beginning of the study and titrated to treat sleep-disordered breathing.  MEDICATIONS Medications taken by the patient : reviewed in electronic medical record. Medications administered by patient during sleep study : No sleep medicine administered.  TECHNICIAN COMMENTS Comments added by technician: BATHROOM BREAKS X1  Comments added by scorer: N/A  RESPIRATORY PARAMETERS Optimal PAP Pressure (cm): 14 AHI at Optimal Pressure (/hr): 11.5 Overall Minimal O2 (%): 86.00 Supine % at Optimal Pressure (%): 0 Minimal O2 at Optimal Pressure (%): 90.0    SLEEP ARCHITECTURE The study was initiated at 11:03:30 PM and ended at 5:11:59 AM. Sleep onset time was 4.4 minutes and the sleep efficiency was 85.4%. The total sleep time was 314.5 minutes. The patient spent 9.70% of the night in stage N1 sleep, 71.70% in stage N2 sleep, 10.49% in  stage N3 and 8.11% in REM.Stage REM latency was 178.0 minutes Wake after sleep onset was 49.6. Alpha intrusion was absent. Supine sleep was 30.68%.  CARDIAC DATA The 2 lead EKG demonstrated sinus rhythm. The mean heart rate was 53.57 beats per minute. Other EKG findings include: PVCs.  LEG MOVEMENT DATA The total Periodic Limb Movements of Sleep (PLMS) were 250. The PLMS index was 47.69. A PLMS index of <15 is considered normal in adults.  IMPRESSIONS She did well with CPAP 14 cm H2O.  She did not require supplemental oxygen once she achieved adequate CPAP setting.    She had an increase in her periodic limb movement index.  DIAGNOSIS - Obstructive Sleep Apnea (327.23 [G47.33 ICD-10]) - Periodic Limb Movements of Sleep (327.51 [G47.61 ICD-10])  RECOMMENDATIONS She should be changed to CPAP 14 cm H2O.  She was fitted with a standard size Resmed Mirage FX nasal mask.   [Electronically signed] 06/18/2016 08:08 AM  Chesley Mires MD, ABSM Diplomate, American Board of Sleep Medicine   NPI: [id number]

## 2016-06-18 NOTE — Telephone Encounter (Signed)
Results have been explained to patient, pt expressed understanding. Nothing further needed.  

## 2016-06-24 ENCOUNTER — Other Ambulatory Visit: Payer: Self-pay | Admitting: Surgery

## 2016-06-24 DIAGNOSIS — D1430 Benign neoplasm of unspecified bronchus and lung: Secondary | ICD-10-CM

## 2016-06-25 ENCOUNTER — Encounter: Payer: Self-pay | Admitting: Surgery

## 2016-06-25 ENCOUNTER — Ambulatory Visit (INDEPENDENT_AMBULATORY_CARE_PROVIDER_SITE_OTHER): Admitting: Surgery

## 2016-06-25 ENCOUNTER — Ambulatory Visit
Admission: RE | Admit: 2016-06-25 | Discharge: 2016-06-25 | Disposition: A | Discharge status: Home / Self Care | Source: Ambulatory Visit | Attending: Surgery | Admitting: Surgery

## 2016-06-25 VITALS — BP 138/88 | HR 59 | Resp 16 | Ht 60.0 in | Wt 210.0 lb

## 2016-06-25 DIAGNOSIS — D1431 Benign neoplasm of right bronchus and lung: Secondary | ICD-10-CM

## 2016-06-25 DIAGNOSIS — Z902 Acquired absence of lung [part of]: Secondary | ICD-10-CM

## 2016-06-25 DIAGNOSIS — D1430 Benign neoplasm of unspecified bronchus and lung: Secondary | ICD-10-CM

## 2016-06-25 NOTE — Progress Notes (Signed)
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 She has been feeling well without cough or chest pain. Her only complaint is of some swelling in the left posterior neck. She had a CT of the neck on 05/30/2016 that showed a 10 mm left posterior triangle node that was unchanged in size compared to her PET scan in 06/2011 and it was not hypermetabolic. She had mildly prominent level 1 and level 2 lymph nodes bilaterally and prominent palatine and lingual tonsils suggesting an inflammatory process.  Current Outpatient Prescriptions  Medication Sig Dispense Refill  . amLODipine (NORVASC) 5 MG tablet TAKE 1 TABLET DAILY 90 tablet 2  . aspirin 81 MG tablet Take 81 mg by mouth daily.    [name] CALCIUM PO Take by mouth 2 (two) times daily.    . Cholecalciferol (VITAMIN D) 2000 UNITS CAPS Take by mouth.    . cyclobenzaprine (FLEXERIL) 5 MG tablet Take 1 tablet (5 mg total) by mouth 3 (three) times daily as needed for muscle spasms. 30 tablet 1  . methocarbamol (ROBAXIN) 750 MG tablet 1-2 po tid  PO for muscle spasm 60 tablet 1  . Multiple Vitamins-Minerals (EYE VITAMINS PO) Take 1 capsule by mouth 2 (two) times daily. "Eye caps"    . Multiple Vitamins-Minerals (MULTIVITAMIN PO) Take 1 tablet by mouth daily.     [name] nystatin (MYCOSTATIN/NYSTOP) 100000 UNIT/GM POWD Apply twice daily as needed 15 g 3  . Omega-3 Fatty Acids (FISH OIL PO) Take 1 capsule by mouth daily.     [name] SYNTHROID 137 MCG tablet TAKE 1 TABLET DAILY 90 tablet 1  . triamcinolone ointment (KENALOG) 0.5 % Apply 1 application topically 2 (two) times daily. Can use for up to 7 days with flare. 60 g 3  . TOPROL XL 100 MG 24 hr tablet TAKE 1 TABLET DAILY WITH OR IMMEDIATELY FOLLOWING A MEAL. (Patient taking differently: TAKE 1/2 TABLET DAILY WITH OR IMMEDIATELY FOLLOWING A MEAL.) 90 tablet 1  . triamterene-hydrochlorothiazide (MAXZIDE-25) 37.5-25 MG tablet  TAKE 1 TABLET DAILY (Patient taking differently: TAKE 1/2 TABLET DAILY) 90 tablet 1   No current facility-administered medications for this visit.      Physical Exam: BP 138/88   Pulse (!) 59   Resp 16   Ht 5' (1.524 m)   Wt 210 lb (95.3 kg)   LMP 02/12/2011   SpO2 97% Comment: ON RA  BMI 41.01 kg/m  She looks well.  There is palpable lymphadenopathy in the left posterior triangle. There is no supraclavicular adenopathy. Lung exam is clear.  Cardiac exam shows a regular rate and rhythm with normal heart sounds.  The right chest thoracotomy scar is well-healed. There are no skin lesions.   Diagnostic Tests:  CLINICAL DATA:  Five year follow-up for carcinoid tumor of the right lung. No current chest complaints ; history of cardiac dysrhythmia, right thoracotomy 5 years ago EXAM: CHEST  2 VIEW COMPARISON:  PA and lateral chest x-ray of June 20, 2015 and chest CT scan of October 26, 2012. FINDINGS: The lungs are adequately inflated and clear. A calcified nodule in the right lower lobe posteriorly is stable. The heart is top-normal in size but stable. The pulmonary vascularity is normal. There surgical clips in the right infrahilar region. There is no pleural effusion. The bony thorax is unremarkable. There is calcification in the wall of the aortic arch. IMPRESSION: No evidence  of active cardiopulmonary disease. No pulmonary parenchymal masses are observed. Evidence of previous granulomatous infection. Aortic atherosclerosis. Electronically Signed   By: David  Martinique M.D.   On: 06/25/2016 10:13  Impression:  There is no sign of recurrent carcinoid 5 years after resection. I have recommended that I see her back in one year with a CT of the chest. The etiology of her left posterior cervical adenopathy is likely inflammatory with enlarged tonsils and other mildly enlarged lymph nodes in the neck.  I don't think anything else needs to be done at this time.    Plan:  Return in one year with a CT scan of the chest.   Gaye Pollack, MD [facility] [phone]

## 2016-07-07 LAB — HM MAMMOGRAPHY

## 2016-07-09 ENCOUNTER — Encounter: Payer: Self-pay | Admitting: Cardiology

## 2016-07-23 ENCOUNTER — Ambulatory Visit (INDEPENDENT_AMBULATORY_CARE_PROVIDER_SITE_OTHER): Admitting: Cardiology

## 2016-07-23 ENCOUNTER — Encounter: Payer: Self-pay | Admitting: Cardiology

## 2016-07-23 VITALS — BP 140/72 | HR 81 | Ht 60.0 in | Wt 215.0 lb

## 2016-07-23 DIAGNOSIS — I2510 Atherosclerotic heart disease of native coronary artery without angina pectoris: Secondary | ICD-10-CM

## 2016-07-23 DIAGNOSIS — E669 Obesity, unspecified: Secondary | ICD-10-CM

## 2016-07-23 DIAGNOSIS — I2584 Coronary atherosclerosis due to calcified coronary lesion: Secondary | ICD-10-CM

## 2016-07-23 DIAGNOSIS — E785 Hyperlipidemia, unspecified: Secondary | ICD-10-CM

## 2016-07-23 DIAGNOSIS — I10 Essential (primary) hypertension: Secondary | ICD-10-CM | POA: Diagnosis not present

## 2016-07-23 HISTORY — DX: Atherosclerotic heart disease of native coronary artery without angina pectoris: I25.10

## 2016-07-23 MED ORDER — ATORVASTATIN CALCIUM 10 MG PO TABS: 10.0000 mg | ORAL_TABLET | Freq: Every day | ORAL | 11 refills | Status: DC

## 2016-07-23 NOTE — Patient Instructions (Signed)
Medication Instructions:  1) START LIPITOR 10 mg daily  Labwork: Your physician recommends that you return for FASTING lab work in [place].  Testing/Procedures: Dr. Radford Pax recommends you have a NUCLEAR STRESS TEST.  Follow-Up: Your physician wants you to follow-up in: 1 year with Dr. Radford Pax. You will receive a reminder letter in the mail two months in advance. If you don't receive a letter, please call our office to schedule the follow-up appointment.   Any Other Special Instructions Will Be Listed Below (If Applicable).     If you need a refill on your cardiac medications before your next appointment, please call your pharmacy.

## 2016-07-23 NOTE — Progress Notes (Signed)
Cardiology Office Note    Date:  07/23/2016   ID:  Marlicia, Sroka [birth year], MRN [id number]  PCP:  [name], MD  Cardiologist:  Fransico Him, MD   No chief complaint on file.   History of Present Illness:  Nichole Smith is a 60 y.o. 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 Cyndia Bent, HTN, OSA on CPAP followed by Dr. Halford Chessman, who was found to have on soft tissue of the neck CT for evaluation of an enlarged lymph node in her neck and was found to have coronary artery calcifications as well as calcification in the aortic arch and carotid bifurcation.  She is now referred for risk stratification.  She denies any chest pain or pressure, SOB, DOE, PND or orthopnea.  She is very active and works out with aerobics.  She denies any LE edema, dizziness or syncope.  She occasionally notices a skipped heart beat but has not had any fluttering for 2 years.      Past Medical History:  Diagnosis Date  . Arthritis    knees, back  . Carcinoid tumor of lung 2012   right found incidentally on chest xray eval for r/o vasxculitis   . Coronary artery calcification 07/23/2016  . Double vision    history, no current problem  . Dysrhythmia    irregular occassional, no current problem  . Endometrial cells on cervical Pap smear inconsistent w/LMP 6/11   benign proliferative endometrium on endometrial biopsy  . HNP (herniated nucleus pulposus), cervical 01/22/2011   C6-C7  Has seen dr Vertell Limber for this and Dr Tonita Cong  Positional numbness in hands no weakness    . Hypertension   . Hypothyroidism   . 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 Sleep apnea    uses CPAP nightly  . SUI (stress urinary incontinence, female)    h/o    Past Surgical History:  Procedure Laterality Date  . CESAREAN SECTION     x 1 - twins  . COLONOSCOPY  07/31/05   brodie - polyps and bx's  . DILATATION & CURRETTAGE/HYSTEROSCOPY WITH RESECTOCOPE N/A  07/18/2013   Procedure: DILATATION & CURETTAGE/HYSTEROSCOPY WITH RESECTOCOPE;  Surgeon: Lyman Speller, MD;  Location: [facility];  Service: Gynecology;  Laterality: N/A;  mass resection  . Lung tumor removed     Rt lung carcinoid  . SHOULDER OPEN ROTATOR CUFF REPAIR  07/20/2010   Right  . WISDOM TOOTH EXTRACTION      Current Medications: Outpatient Medications Prior to Visit  Medication Sig Dispense Refill  . amLODipine (NORVASC) 5 MG tablet TAKE 1 TABLET DAILY 90 tablet 2  . aspirin 81 MG tablet Take 81 mg by mouth daily.    [name] CALCIUM PO Take by mouth 2 (two) times daily.    . Cholecalciferol (VITAMIN D) 2000 UNITS CAPS Take by mouth.    . cyclobenzaprine (FLEXERIL) 5 MG tablet Take 1 tablet (5 mg total) by mouth 3 (three) times daily as needed for muscle spasms. 30 tablet 1  . methocarbamol (ROBAXIN) 750 MG tablet 1-2 po tid  PO for muscle spasm 60 tablet 1  . Multiple Vitamins-Minerals (EYE VITAMINS PO) Take 1 capsule by mouth 2 (two) times daily. "Eye caps"    . Multiple Vitamins-Minerals (MULTIVITAMIN PO) Take 1 tablet by mouth daily.     [name] nystatin (MYCOSTATIN/NYSTOP) 100000 UNIT/GM POWD Apply twice daily as needed 15 g 3  . Omega-3 Fatty Acids (  FISH OIL PO) Take 1 capsule by mouth daily.     [name] SYNTHROID 137 MCG tablet TAKE 1 TABLET DAILY 90 tablet 1  . triamcinolone ointment (KENALOG) 0.5 % Apply 1 application topically 2 (two) times daily. Can use for up to 7 days with flare. 60 g 3  . TOPROL XL 100 MG 24 hr tablet TAKE 1 TABLET DAILY WITH OR IMMEDIATELY FOLLOWING A MEAL. (Patient taking differently: TAKE 1/2 TABLET DAILY WITH OR IMMEDIATELY FOLLOWING A MEAL.) 90 tablet 1  . triamterene-hydrochlorothiazide (MAXZIDE-25) 37.5-25 MG tablet TAKE 1 TABLET DAILY (Patient taking differently: TAKE 1/2 TABLET DAILY) 90 tablet 1   No facility-administered medications prior to visit.      Allergies:   Neomycin and Ace inhibitors   Social History   Social History  . Marital status:  Married    Spouse name: N/A  . Number of children: 3  . Years of education: N/A   Occupational History  . retied [employer]   Social History Main Topics  . Smoking status: Never Smoker  . Smokeless tobacco: Never Used  . Alcohol use 1.2 oz/week    2 Glasses of wine per week  . Drug use: No  . Sexual activity: Yes    Partners: Male    Birth control/ protection: None, Post-menopausal   Other Topics Concern  . None   Social History Narrative   hhof 2    2 Children at college and  beyond      [employer] full time   Married    Retired age 41 2015     Family History:  The patient's family history includes Coronary artery disease in her father; Diabetes in her brother; Hypertension in her brother, father, maternal uncle, and mother; Parkinson's disease in her brother; Stroke in her mother.   ROS:   Please see the history of present illness.    ROS All other systems reviewed and are negative.   PHYSICAL EXAM:   VS:  BP 140/72   Pulse 81   Ht 5' (1.524 m)   Wt 215 lb (97.5 kg)   LMP 02/12/2011   SpO2 95%   BMI 41.99 kg/m    GEN: Well nourished, well developed, in no acute distress  HEENT: normal  Neck: no JVD, carotid bruits, or masses Cardiac: RRR; no murmurs, rubs, or gallops,no edema.  Intact distal pulses bilaterally.  Respiratory:  clear to auscultation bilaterally, normal work of breathing GI: soft, nontender, nondistended, + BS MS: no deformity or atrophy  Skin: warm and dry, no rash Neuro:  Alert and Oriented x 3, Strength and sensation are intact Psych: euthymic mood, full affect  Wt Readings from Last 3 Encounters:  07/23/16 215 lb (97.5 kg)  06/25/16 210 lb (95.3 kg)  06/10/16 210 lb 3.2 oz (95.3 kg)      Studies/Labs Reviewed:   EKG:  EKG is not ordered today.   Recent Labs: 03/05/2016: ALT 18; BUN 25; Creatinine, Ser 0.71; Hemoglobin 15.7; Platelets 251.0; Potassium 4.1; Sodium 140; TSH 2.31   Lipid Panel    Component  Value Date/Time   CHOL 145 03/05/2016 0855   TRIG 58.0 03/05/2016 0855   HDL 47.10 03/05/2016 0855   CHOLHDL 3 03/05/2016 0855   VLDL 11.6 03/05/2016 0855   LDLCALC 86 03/05/2016 0855    Additional studies/ records that were reviewed today include:  Office noteds    ASSESSMENT:    1. Essential hypertension   2. Coronary artery calcification  3. Dyslipidemia, goal LDL below 70   4. Obesity, unspecified      PLAN:  In order of problems listed above:  1. HTN - BP controlled.  Continue amlodipine/BB and diuretic.  2. Coronary artery calcifications - this was noted on recent CT.  She has no anginal symptoms but does have a family history of CAD.  She is a nonsmoker and last LDL was 86.  I will get a stress myoview to rule out ischemia.  If negative will need aggressive risk factor modification.  Will start on low dose statin and continue ASA.   3. Dyslipidemia with LDL goal < 70 due to evidence of vascular disease on recent CT.  Will start Lipitor '10mg'$  daily and repeat an FLP with NMR in 6 weeks.   4. Obesity - I have encouraged her to get into a routine exercise program and cut back on carbs and portions.     Medication Adjustments/Labs and Tests Ordered: Current medicines are reviewed at length with the patient today.  Concerns regarding medicines are outlined above.  Medication changes, Labs and Tests ordered today are listed in the Patient Instructions below.  There are no Patient Instructions on file for this visit.   Signed, Fransico Him, MD  07/23/2016 2:39 PM    [address], [place],   [address] Phone: [phone]; Fax: [phone]

## 2016-07-28 ENCOUNTER — Encounter: Payer: Self-pay | Admitting: Family Medicine

## 2016-07-29 ENCOUNTER — Encounter: Payer: Self-pay | Admitting: Internal Medicine

## 2016-08-01 ENCOUNTER — Ambulatory Visit (INDEPENDENT_AMBULATORY_CARE_PROVIDER_SITE_OTHER)

## 2016-08-01 DIAGNOSIS — Z23 Encounter for immunization: Secondary | ICD-10-CM

## 2016-08-05 ENCOUNTER — Encounter: Payer: Self-pay | Admitting: Obstetrics & Gynecology

## 2016-08-06 MED ORDER — HYDROCHLOROTHIAZIDE 12.5 MG PO CAPS: 12.5000 mg | ORAL_CAPSULE | Freq: Every day | ORAL | 0 refills | Status: DC

## 2016-08-06 NOTE — Telephone Encounter (Signed)
Ok to change diuretic   Stop maxide  Begin  12.5 mg hctz take  1 po qd disp 90 refill x 1   Her BP readings are good at home    Prefer bp 120 /80 or below to reduce Cv risk

## 2016-08-18 ENCOUNTER — Telehealth (HOSPITAL_COMMUNITY): Payer: Self-pay | Admitting: Radiology

## 2016-08-18 NOTE — Telephone Encounter (Signed)
Patient given detailed instructions per Myocardial Perfusion Study Information Sheet for the test on 08/20/2016 at 12:30. Patient notified to arrive 15 minutes early and that it is imperative to arrive on time for appointment to keep from having the test rescheduled.  If you need to cancel or reschedule your appointment, please call the office within 24 hours of your appointment. Failure to do so may result in a cancellation of your appointment, and a $50 no show fee. Patient verbalized understanding.EHK

## 2016-08-19 ENCOUNTER — Encounter: Payer: Self-pay | Admitting: Internal Medicine

## 2016-08-19 ENCOUNTER — Other Ambulatory Visit: Payer: Self-pay | Admitting: Internal Medicine

## 2016-08-19 ENCOUNTER — Ambulatory Visit (INDEPENDENT_AMBULATORY_CARE_PROVIDER_SITE_OTHER): Admitting: Internal Medicine

## 2016-08-19 VITALS — BP 138/80 | Temp 98.4°F | Wt 209.6 lb

## 2016-08-19 DIAGNOSIS — K6289 Other specified diseases of anus and rectum: Secondary | ICD-10-CM

## 2016-08-19 MED ORDER — LIDOCAINE HCL 2 % EX GEL: 1.0000 "application " | CUTANEOUS | 0 refills | Status: DC | PRN

## 2016-08-19 MED ORDER — DICLOXACILLIN SODIUM 500 MG PO CAPS: 500.0000 mg | ORAL_CAPSULE | Freq: Four times a day (QID) | ORAL | 0 refills | Status: DC

## 2016-08-19 MED ORDER — FLUCONAZOLE 150 MG PO TABS: 150.0000 mg | ORAL_TABLET | Freq: Once | ORAL | 0 refills | Status: AC

## 2016-08-19 MED ORDER — VALACYCLOVIR HCL 1 G PO TABS: 1000.0000 mg | ORAL_TABLET | Freq: Three times a day (TID) | ORAL | 0 refills | Status: DC

## 2016-08-19 NOTE — Patient Instructions (Addendum)
Checking for bacteria strep staph and   Herpes  Superinfection Take  Med for yeast and virus and  Bacteria  Because of severity of  Sx  Can add topical  Lidocaine for pain  Cool compresses  interim may want to delay your stress test   Will notify you  of labs when available.  Contact us about how doing  In 3 days before weekend

## 2016-08-19 NOTE — Progress Notes (Signed)
Pre visit review using our clinic review tool, if applicable. No additional management support is needed unless otherwise documented below in the visit note.  Chief Complaint  Patient presents with  . Hemorrhoids    Ongoing for several weeks but worsening.  . Rectal Pain    HPI: Nichole Smith 60 y.o. comes in for 2 weeks of progressive ithcing and then painful anal area    Has had irritation and sensitive skin in past and had eval by [facility] but this time very pain ful and hard to sit down with this  No blood  stooling ok   Not know what to do next with this .   Supposed to have stress test tomorrow and not sure she can walk well enough ROS: See pertinent positives and negatives per HPI. 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   No recurrence   Patient states we dx but previous EHR.   Past Medical History:  Diagnosis Date  . Arthritis    knees, back  . Carcinoid tumor of lung 2012   right found incidentally on chest xray eval for r/o vasxculitis   . Coronary artery calcification 07/23/2016  . Double vision    history, no current problem  . Dysrhythmia    irregular occassional, no current problem  . Endometrial cells on cervical Pap smear inconsistent w/LMP 6/11   benign proliferative endometrium on endometrial biopsy  . HNP (herniated nucleus pulposus), cervical 01/22/2011   C6-C7  Has seen dr Vertell Limber for this and Dr Tonita Cong  Positional numbness in hands no weakness    . Hypertension   . Hypothyroidism   . 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 Sleep apnea    uses CPAP nightly  . SUI (stress urinary incontinence, female)    h/o    Family History  Problem Relation Age of Onset  . Stroke Mother     died age 89  . Hypertension Mother   . Coronary artery disease Father     died age 48  . Hypertension Father   . Parkinson's disease Brother   . Diabetes Brother   . Hypertension Maternal Uncle   . Hypertension Brother     x2  . Colon cancer Neg Hx   . Rectal  cancer Neg Hx   . Stomach cancer Neg Hx     Social History   Social History  . Marital status: Married    Spouse name: N/A  . Number of children: 3  . Years of education: N/A   Occupational History  . retied [employer]   Social History Main Topics  . Smoking status: Never Smoker  . Smokeless tobacco: Never Used  . Alcohol use 1.2 oz/week    2 Glasses of wine per week  . Drug use: No  . Sexual activity: Yes    Partners: Male    Birth control/ protection: None, Post-menopausal   Other Topics Concern  . None   Social History Narrative   hhof 2    2 Children at college and  beyond      [name] full time   Married    Retired age 54 2015    Outpatient Medications Prior to Visit  Medication Sig Dispense Refill  . amLODipine (NORVASC) 5 MG tablet TAKE 1 TABLET DAILY 90 tablet 2  . aspirin 81 MG tablet Take 81 mg by mouth daily.    [name] atorvastatin (LIPITOR) 10  MG tablet Take 1 tablet (10 mg total) by mouth daily. 30 tablet 11  . CALCIUM PO Take by mouth 2 (two) times daily.    . Cholecalciferol (VITAMIN D) 2000 UNITS CAPS Take by mouth.    . cyclobenzaprine (FLEXERIL) 5 MG tablet Take 1 tablet (5 mg total) by mouth 3 (three) times daily as needed for muscle spasms. 30 tablet 1  . hydrochlorothiazide (MICROZIDE) 12.5 MG capsule Take 1 capsule (12.5 mg total) by mouth daily. 90 capsule 0  . methocarbamol (ROBAXIN) 750 MG tablet 1-2 po tid  PO for muscle spasm 60 tablet 1  . metoprolol succinate (TOPROL-XL) 100 MG 24 hr tablet Take 100 mg by mouth daily. Take with or immediately following a meal. PT takes 50 mg daily    . Multiple Vitamins-Minerals (EYE VITAMINS PO) Take 1 capsule by mouth 2 (two) times daily. "Eye caps"    . Multiple Vitamins-Minerals (MULTIVITAMIN PO) Take 1 tablet by mouth daily.     [name] nystatin (MYCOSTATIN/NYSTOP) 100000 UNIT/GM POWD Apply twice daily as needed 15 g 3  . Omega-3 Fatty Acids (FISH OIL PO) Take 1 capsule by mouth  daily.     [name] SYNTHROID 137 MCG tablet TAKE 1 TABLET DAILY 90 tablet 1  . triamcinolone ointment (KENALOG) 0.5 % Apply 1 application topically 2 (two) times daily. Can use for up to 7 days with flare. 60 g 3   No facility-administered medications prior to visit.      EXAM:  BP 138/80 (BP Location: Left Arm, Cuff Size: Large)   Temp 98.4 F (36.9 C) (Oral)   Wt 209 lb 9.6 oz (95.1 kg)   LMP 02/12/2011   BMI 40.93 kg/m   Body mass index is 40.93 kg/m.  GENERAL: vitals reviewed and listed above, alert, oriented, appears well hydrated and in no acute distress HEENT: atraumatic, conjunctiva  clear, no obvious abnormalities on inspection of external nose and ears  NECK: no obvious masses on inspection palpation  CV: HRRR, no clubbing cyanosis or  peripheral edema nl cap refill   Ext perineal area  With very red but scattered bumps ? Ulcer pinpoint  satelliote area no edema bleeding or crusting  Very tender   MS: moves all extremities without noticeable focal  abnormality PSYCH: pleasant and cooperative, no obvious depression or anxiety BP Readings from Last 3 Encounters:  08/19/16 138/80  07/23/16 140/72  06/25/16 138/88   Wt Readings from Last 3 Encounters:  08/19/16 209 lb 9.6 oz (95.1 kg)  07/23/16 215 lb (97.5 kg)  06/25/16 210 lb (95.3 kg)    ASSESSMENT AND PLAN:  Discussed the following assessment and plan:  Perianal infection - Plan: Herpes Simplex Virus Culture, WOUND CULTURE  Rectal pain - Plan: Herpes Simplex Virus Culture, WOUND CULTURE Uncertain cause  cx for staph strep and hsv   Doesn't seem like strep  But poss other   Empiric rx for all  Avoiding topicals except trial of lidocaine cause of pain .  Doesn't really look like strep cellulitis but could be other bacterial.  Contact us in 2-3 days before weekend about how  this is doing -Patient advised to return or notify health care team  if symptoms worsen ,persist or new concerns arise.  Patient Instructions    Checking for bacteria strep staph and   Herpes  Superinfection Take  Med for yeast and virus and  Bacteria  Because of severity of  Sx  Can add topical  Lidocaine for pain  Cool compresses  interim may want to delay your stress test   Will notify you  of labs when available.  Contact us about how doing  In 3 days before weekend    [place]. Meliza Kage M.D.

## 2016-08-20 ENCOUNTER — Ambulatory Visit (HOSPITAL_COMMUNITY)

## 2016-08-21 ENCOUNTER — Ambulatory Visit (HOSPITAL_COMMUNITY)

## 2016-08-21 ENCOUNTER — Telehealth (HOSPITAL_COMMUNITY): Payer: Self-pay | Admitting: *Deleted

## 2016-08-21 ENCOUNTER — Encounter: Payer: Self-pay | Admitting: Internal Medicine

## 2016-08-21 NOTE — Telephone Encounter (Signed)
Patient given detailed instructions per Myocardial Perfusion Study Information Sheet for the test on 08/25/16. Patient notified to arrive 15 minutes early and that it is imperative to arrive on time for appointment to keep from having the test rescheduled.  If you need to cancel or reschedule your appointment, please call the office within 24 hours of your appointment. Failure to do so may result in a cancellation of your appointment, and a $50 no show fee. Patient verbalized understanding. Hubbard Robinson, RN

## 2016-08-22 LAB — WOUND CULTURE
Gram Stain: NONE SEEN
Gram Stain: NONE SEEN

## 2016-08-25 ENCOUNTER — Ambulatory Visit (HOSPITAL_COMMUNITY): Attending: Cardiovascular Disease

## 2016-08-25 DIAGNOSIS — I2510 Atherosclerotic heart disease of native coronary artery without angina pectoris: Secondary | ICD-10-CM | POA: Diagnosis present

## 2016-08-25 DIAGNOSIS — I2584 Coronary atherosclerosis due to calcified coronary lesion: Secondary | ICD-10-CM | POA: Diagnosis not present

## 2016-08-25 DIAGNOSIS — I10 Essential (primary) hypertension: Secondary | ICD-10-CM | POA: Insufficient documentation

## 2016-08-25 LAB — HERPES SIMPLEX VIRUS CULTURE: ORGANISM ID, BACTERIA: DETECTED

## 2016-08-25 MED ORDER — TECHNETIUM TC 99M TETROFOSMIN IV KIT
32.4000 | PACK | Freq: Once | INTRAVENOUS | Status: AC | PRN
  Administered 2016-08-25: 32 via INTRAVENOUS
  Filled 2016-08-25: qty 32

## 2016-08-26 ENCOUNTER — Ambulatory Visit (HOSPITAL_COMMUNITY): Attending: Cardiology

## 2016-08-26 LAB — MYOCARDIAL PERFUSION IMAGING
CHL CUP MPHR: 159 {beats}/min
CHL CUP NUCLEAR SSS: 3
CSEPED: 6 min
CSEPEDS: 0 s
CSEPEW: 7 METS
CSEPPHR: 144 {beats}/min
LHR: 0.32
LV dias vol: 88 mL (ref 46–106)
LV sys vol: 32 mL
NUC STRESS TID: 0.89
Percent HR: 90 %
Rest HR: 73 {beats}/min
SDS: 0
SRS: 3

## 2016-08-26 MED ORDER — TECHNETIUM TC 99M TETROFOSMIN IV KIT
31.7000 | PACK | Freq: Once | INTRAVENOUS | Status: AC | PRN
  Administered 2016-08-26: 31.7 via INTRAVENOUS
  Filled 2016-08-26: qty 32

## 2016-08-28 ENCOUNTER — Other Ambulatory Visit: Payer: Self-pay

## 2016-08-28 DIAGNOSIS — I2510 Atherosclerotic heart disease of native coronary artery without angina pectoris: Secondary | ICD-10-CM

## 2016-08-28 DIAGNOSIS — I2584 Coronary atherosclerosis due to calcified coronary lesion: Principal | ICD-10-CM

## 2016-08-28 DIAGNOSIS — I10 Essential (primary) hypertension: Secondary | ICD-10-CM

## 2016-08-29 ENCOUNTER — Encounter: Payer: Self-pay | Admitting: Internal Medicine

## 2016-08-29 NOTE — Telephone Encounter (Signed)
Results are not in the electronic record.  Please contact lab and  get results. Please contact patient with the results are still not in the chart and we are looking for them.

## 2016-09-05 NOTE — Telephone Encounter (Signed)
Spoke to the pt.  She cannot come in today to give urine.  Sent to scheduling for Saturday Clinic.

## 2016-09-06 ENCOUNTER — Encounter: Payer: Self-pay | Admitting: Family Medicine

## 2016-09-06 ENCOUNTER — Ambulatory Visit (INDEPENDENT_AMBULATORY_CARE_PROVIDER_SITE_OTHER): Admitting: Family Medicine

## 2016-09-06 VITALS — BP 128/86 | HR 86 | Temp 98.2°F | Resp 14 | Ht 60.0 in | Wt 210.0 lb

## 2016-09-06 DIAGNOSIS — R399 Unspecified symptoms and signs involving the genitourinary system: Secondary | ICD-10-CM

## 2016-09-06 LAB — POC URINALSYSI DIPSTICK (AUTOMATED)
Bilirubin, UA: NEGATIVE
Glucose, UA: NEGATIVE
Ketones, UA: NEGATIVE
Nitrite, UA: NEGATIVE
PROTEIN UA: NEGATIVE
RBC UA: NEGATIVE
SPEC GRAV UA: 1.01
UROBILINOGEN UA: 0.2
pH, UA: 7

## 2016-09-06 MED ORDER — NITROFURANTOIN MONOHYD MACRO 100 MG PO CAPS: 100.0000 mg | ORAL_CAPSULE | Freq: Two times a day (BID) | ORAL | 0 refills | Status: DC

## 2016-09-06 NOTE — Patient Instructions (Signed)
  WALK-IN CLINIC VISIT:  Adequate fluid intake, avoid holding urine for long hours, and over the counter Vit C OR cranberry capsules might help.  Today we will treat empirically with antibiotic, which we might need to change when urine culture comes back depending of bacteria susceptibility.  Seek immediate medical attention if severe abdominal pain, vomiting, fever/chills, or worsening symptoms. F/U if symptomatic are not any better after 2-3 days of antibiotic treatment.

## 2016-09-06 NOTE — Progress Notes (Signed)
HPI:  ACUTE VISIT:  Chief Complaint  Patient presents with  . Dysuria    [name] is a 60 y.o. female, who is here today complaining of a week of urinary symptoms.  She starts with reporting months of "vaginal irritation" and pruritus, she states that she has been treated her with "every thing" and still having some of these symptoms; currently she is following with gynecologists.  About a weeks ago she noted mild urinary symptoms and worse since last night. She denies fever, chills, or myalgias.  Dysuria: "little" Urinary frequency: yes Urinary urgency: yes Incontinence: No Hematuria: Denies  Abdominal pain: Denies Nausea or vomiting: Denies Abnormal vaginal bleeding or discharge: Denies  IRW:[id number] Sexual activity: Denies Hx of UTI: No   OTC medications for this problem: Azo    Review of Systems  Constitutional: Negative for activity change, appetite change, chills, fatigue, fever and unexpected weight change.  Gastrointestinal: Negative for abdominal pain, nausea and vomiting.       No changes in bowel movements.  Genitourinary: Positive for dysuria, frequency and urgency. Negative for decreased urine volume, difficulty urinating, flank pain, hematuria, pelvic pain, vaginal bleeding, vaginal discharge and vaginal pain.  Musculoskeletal: Negative for back pain and myalgias.  Skin: Negative for rash and wound.  Psychiatric/Behavioral: Negative for confusion. The patient is nervous/anxious.       Current Outpatient Prescriptions on File Prior to Visit  Medication Sig Dispense Refill  . amLODipine (NORVASC) 5 MG tablet TAKE 1 TABLET DAILY 90 tablet 2  . aspirin 81 MG tablet Take 81 mg by mouth daily.    [name] atorvastatin (LIPITOR) 10 MG tablet Take 1 tablet (10 mg total) by mouth daily. 30 tablet 11  . CALCIUM PO Take by mouth 2 (two) times daily.    . Cholecalciferol (VITAMIN D) 2000 UNITS CAPS Take by mouth.    . cyclobenzaprine (FLEXERIL)  5 MG tablet Take 1 tablet (5 mg total) by mouth 3 (three) times daily as needed for muscle spasms. 30 tablet 1  . dicloxacillin (DYNAPEN) 500 MG capsule Take 1 capsule (500 mg total) by mouth 4 (four) times daily. 28 capsule 0  . hydrochlorothiazide (MICROZIDE) 12.5 MG capsule Take 1 capsule (12.5 mg total) by mouth daily. 90 capsule 0  . lidocaine (XYLOCAINE) 2 % jelly Apply 1 application topically as needed. For pain up to qid 30 mL 0  . methocarbamol (ROBAXIN) 750 MG tablet 1-2 po tid  PO for muscle spasm 60 tablet 1  . metoprolol succinate (TOPROL-XL) 100 MG 24 hr tablet Take 100 mg by mouth daily. Take with or immediately following a meal. PT takes 50 mg daily    . Multiple Vitamins-Minerals (EYE VITAMINS PO) Take 1 capsule by mouth 2 (two) times daily. "Eye caps"    . Multiple Vitamins-Minerals (MULTIVITAMIN PO) Take 1 tablet by mouth daily.     [name] nystatin (MYCOSTATIN/NYSTOP) 100000 UNIT/GM POWD Apply twice daily as needed 15 g 3  . Omega-3 Fatty Acids (FISH OIL PO) Take 1 capsule by mouth daily.     [name] SYNTHROID 137 MCG tablet TAKE 1 TABLET DAILY 90 tablet 1  . triamcinolone ointment (KENALOG) 0.5 % Apply 1 application topically 2 (two) times daily. Can use for up to 7 days with flare. 60 g 3  . valACYclovir (VALTREX) 1000 MG tablet Take 1 tablet (1,000 mg total) by mouth 3 (three) times daily. 21 tablet 0   No current facility-administered medications on file prior to  visit.      Past Medical History:  Diagnosis Date  . Arthritis    knees, back  . Carcinoid tumor of lung 2012   right found incidentally on chest xray eval for r/o vasxculitis   . Coronary artery calcification 07/23/2016  . Double vision    history, no current problem  . Dysrhythmia    irregular occassional, no current problem  . Endometrial cells on cervical Pap smear inconsistent w/LMP 6/11   benign proliferative endometrium on endometrial biopsy  . HNP (herniated nucleus pulposus), cervical 01/22/2011   C6-C7   Has seen dr Vertell Limber for this and Dr Tonita Cong  Positional numbness in hands no weakness    . Hypertension   . Hypothyroidism   . 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 Sleep apnea    uses CPAP nightly  . SUI (stress urinary incontinence, female)    h/o   Allergies  Allergen Reactions  . Neomycin     REACTION: Inflammation  . Ace Inhibitors Cough    Social History   Social History  . Marital status: Married    Spouse name: N/A  . Number of children: 3  . Years of education: N/A   Occupational History  . retied [employer]   Social History Main Topics  . Smoking status: Never Smoker  . Smokeless tobacco: Never Used  . Alcohol use 1.2 oz/week    2 Glasses of wine per week  . Drug use: No  . Sexual activity: Yes    Partners: Male    Birth control/ protection: None, Post-menopausal   Other Topics Concern  . Not on file   Social History Narrative   hhof 2    2 Children at college and  beyond      [name] full time   Married    Retired age 60 2015    Vitals:   09/06/16 0854  BP: 128/86  Pulse: 86  Resp: 14  Temp: 98.2 F (36.8 C)   Body mass index is 41.01 kg/m.      Physical Exam  Nursing note and vitals reviewed. Constitutional: She is oriented to person, place, and time. She appears well-developed. She does not appear ill. No distress.  HENT:  Head: Atraumatic.  Mouth/Throat: Oropharynx is clear and moist and mucous membranes are normal.  Eyes: Conjunctivae are normal.  Cardiovascular: Normal rate and regular rhythm.   Respiratory: No respiratory distress.  GI: Soft. She exhibits no mass. There is no hepatomegaly. There is no tenderness. There is no CVA tenderness.  Musculoskeletal: She exhibits edema (trace pitting edema LE bilateral.). She exhibits no tenderness.  Neurological: She is alert and oriented to person, place, and time. Coordination and gait normal.  Skin: Skin is warm. No erythema.  Psychiatric: Her  speech is normal. Her mood appears anxious.      ASSESSMENT AND PLAN:     Nichole Smith was seen today for dysuria.  Diagnoses and all orders for this visit:  Urinary tract infection symptoms -     nitrofurantoin, macrocrystal-monohydrate, (MACROBID) 100 MG capsule; Take 1 capsule (100 mg total) by mouth 2 (two) times daily. -     Urine Culture; Future -     POCT Urinalysis Dipstick (Automated)    Possible causes of reported urinary symptoms discussed. Urine dip today + LEU, rest otherwise negative. Ucx ordered.   Empiric abx treatment started today and will be tailored according to Ucx results and susceptibility report. Adequate hydration. Clearly instructed about  warning signs. F/U if symptoms persist.     Return if symptoms worsen or fail to improve.     -[name] was advised to return or notify a doctor immediately if symptoms worsen or persist or new concerns arise, she voices understanding and agrees with plan.       Adonis Yim G. [place], MD  [facility] [place] office.

## 2016-09-06 NOTE — Progress Notes (Signed)
Pre visit review using our clinic review tool, if applicable. No additional management support is needed unless otherwise documented below in the visit note. 

## 2016-09-08 ENCOUNTER — Other Ambulatory Visit: Admitting: *Deleted

## 2016-09-08 DIAGNOSIS — I2584 Coronary atherosclerosis due to calcified coronary lesion: Secondary | ICD-10-CM

## 2016-09-08 DIAGNOSIS — I2510 Atherosclerotic heart disease of native coronary artery without angina pectoris: Secondary | ICD-10-CM

## 2016-09-08 DIAGNOSIS — I10 Essential (primary) hypertension: Secondary | ICD-10-CM

## 2016-09-08 DIAGNOSIS — E785 Hyperlipidemia, unspecified: Secondary | ICD-10-CM

## 2016-09-08 LAB — HEPATIC FUNCTION PANEL
ALK PHOS: 61 U/L (ref 33–130)
ALT: 22 U/L (ref 6–29)
AST: 23 U/L (ref 10–35)
Albumin: 3.7 g/dL (ref 3.6–5.1)
BILIRUBIN DIRECT: 0.1 mg/dL (ref <=0.2)
BILIRUBIN INDIRECT: 0.4 mg/dL (ref 0.2–1.2)
BILIRUBIN TOTAL: 0.5 mg/dL (ref 0.2–1.2)
Total Protein: 6.9 g/dL (ref 6.1–8.1)

## 2016-09-08 LAB — CBC WITH DIFFERENTIAL/PLATELET
Basophils Absolute: 0 cells/uL (ref 0–200)
Basophils Relative: 0 %
Eosinophils Absolute: 150 cells/uL (ref 15–500)
Eosinophils Relative: 2 %
HEMATOCRIT: 44.6 % (ref 35.0–45.0)
HEMOGLOBIN: 15.1 g/dL (ref 11.7–15.5)
LYMPHS ABS: 2325 {cells}/uL (ref 850–3900)
LYMPHS PCT: 31 %
MCH: 31 pg (ref 27.0–33.0)
MCHC: 33.9 g/dL (ref 32.0–36.0)
MCV: 91.6 fL (ref 80.0–100.0)
MONO ABS: 600 {cells}/uL (ref 200–950)
MPV: 9 fL (ref 7.5–12.5)
Monocytes Relative: 8 %
NEUTROS PCT: 59 %
Neutro Abs: 4425 cells/uL (ref 1500–7800)
Platelets: 252 10*3/uL (ref 140–400)
RBC: 4.87 MIL/uL (ref 3.80–5.10)
RDW: 13.2 % (ref 11.0–15.0)
WBC: 7.5 10*3/uL (ref 3.8–10.8)

## 2016-09-09 ENCOUNTER — Telehealth: Payer: Self-pay | Admitting: Cardiology

## 2016-09-09 NOTE — Telephone Encounter (Signed)
New message    Pt verbalized that she is returning call for rn about test results

## 2016-09-09 NOTE — Telephone Encounter (Signed)
Informed patient of results and verbal understanding expressed.  

## 2016-09-09 NOTE — Telephone Encounter (Signed)
-----   Message from Sueanne Margarita, MD sent at 09/08/2016  3:18 PM EDT ----- Please let patient know that labs were normal.  Continue current medical therapy.

## 2016-09-10 ENCOUNTER — Other Ambulatory Visit: Payer: Self-pay | Admitting: *Deleted

## 2016-09-10 ENCOUNTER — Encounter: Payer: Self-pay | Admitting: Cardiology

## 2016-09-10 MED ORDER — ATORVASTATIN CALCIUM 10 MG PO TABS: 10.0000 mg | ORAL_TABLET | Freq: Every day | ORAL | 3 refills | Status: DC

## 2016-09-11 LAB — CARDIO IQ(R) ADVANCED LIPID PANEL
APOLIPOPROTEIN (CARDIO IQ ADV LIPID PANEL): 52 mg/dL (ref 49–103)
CHOLESTEROL, TOTAL (CARDIO IQ ADV LIPID PANEL): 122 mg/dL (ref <200)
CHOLESTEROL/HDL RATIO (CARDIO IQ ADV LIPID PANEL): 2.2 calc (ref <5.0)
HDL CHOLESTEROL (CARDIO IQ ADV LIPID PANEL): 55 mg/dL (ref >50)
LDL CHOLESTEROL CALCULATED (CARDIO IQ ADV LIPID PANEL): 53 mg/dL (ref <100)
LDL Large: 5209 nmol/L (ref 5038–17886)
LDL Medium: 113 nmol/L — ABNORMAL LOW (ref 121–397)
LDL PARTICLE NUMBER: 658 nmol/L — AB (ref 1016–2185)
LDL Peak Size: 228 Angstrom (ref >=218.2)
LDL Small: 93 nmol/L — ABNORMAL LOW (ref 115–386)
LIPOPROTEIN (A) (CARDIO IQ ADV LIPID PANEL): 393 nmol/L — AB (ref <75)
Non-HDL Cholesterol: 67 mg/dL (calc) (ref <130)
TRIGLYCERIDES (CARDIO IQ ADV LIPID PANEL): 48 mg/dL (ref <150)

## 2016-09-18 ENCOUNTER — Telehealth: Payer: Self-pay

## 2016-09-18 DIAGNOSIS — E7841 Elevated Lipoprotein(a): Secondary | ICD-10-CM

## 2016-09-18 MED ORDER — ATORVASTATIN CALCIUM 40 MG PO TABS: 40.0000 mg | ORAL_TABLET | Freq: Every day | ORAL | 3 refills | Status: DC

## 2016-09-18 NOTE — Telephone Encounter (Signed)
-----   Message from Sueanne Margarita, MD sent at 09/12/2016  4:11 PM EDT ----- Please increase Lipitor to '40mg'$  daily and repeat FLP and ALT in 6 weeks due to elevated lipoprotein (a).

## 2016-09-18 NOTE — Telephone Encounter (Signed)
Informed patient of results and verbal understanding expressed.  Instructed patient to INCREASE LIPITOR to 40 mg daily. Repeat labs scheduled December 4. Patient agrees with treatment plan.

## 2016-10-16 ENCOUNTER — Ambulatory Visit (INDEPENDENT_AMBULATORY_CARE_PROVIDER_SITE_OTHER): Admitting: Family Medicine

## 2016-10-16 ENCOUNTER — Ambulatory Visit: Payer: Self-pay

## 2016-10-16 ENCOUNTER — Encounter: Payer: Self-pay | Admitting: Family Medicine

## 2016-10-16 VITALS — BP 128/90 | HR 66 | Ht 60.0 in | Wt 214.6 lb

## 2016-10-16 DIAGNOSIS — M79672 Pain in left foot: Secondary | ICD-10-CM | POA: Diagnosis not present

## 2016-10-16 DIAGNOSIS — S86012A Strain of left Achilles tendon, initial encounter: Secondary | ICD-10-CM

## 2016-10-16 DIAGNOSIS — M7751 Other enthesopathy of right foot: Secondary | ICD-10-CM

## 2016-10-16 DIAGNOSIS — M25572 Pain in left ankle and joints of left foot: Secondary | ICD-10-CM

## 2016-10-16 MED ORDER — NITROGLYCERIN 0.2 MG/HR TD PT24: MEDICATED_PATCH | TRANSDERMAL | 1 refills | Status: DC

## 2016-10-16 NOTE — Patient Instructions (Signed)
Sorry for the bad news.  Wear boot with any walking for the next  2 weeks.  Ice 20 minutes 2 times daily. Usually after activity and before bed. Can come out of the boot with sitting and move ankle around.  If swelling or pain significantly worsens please see k medical attention immediately.  pennsaid pinkie amount topically 2 times daily as needed.  Duexis 3 times a day for 3 days.  Nitroglycerin Protocol   Apply 1/4 nitroglycerin patch to affected area daily.  Change position of patch within the affected area every 24 hours.  You may experience a headache during the first 1-2 weeks of using the patch, these should subside.  If you experience headaches after beginning nitroglycerin patch treatment, you may take your preferred over the counter pain reliever.  Another side effect of the nitroglycerin patch is skin irritation or rash related to patch adhesive.  Please notify our office if you develop more severe headaches or rash, and stop the patch.  Tendon healing with nitroglycerin patch may require 12 to 24 weeks depending on the extent of injury.  Men should not use if taking Viagra, Cialis, or Levitra.   Do not use if you have migraines or rosacea.   See me again in 2 weeks.

## 2016-10-16 NOTE — Assessment & Plan Note (Signed)
Patient does have more of a partial tear of the left Achilles tendon. I do believe that patient's retrocalcaneal bursitis seems to be worse as well. Possible injury to the soleus muscle. Discussed with patient at great length about unable to rule out Klonopin at this time the patient declined any Doppler secondary to other appointments. Patient knows if worsening symptoms though to seek medical attention immediately. Patient given a three-day burst of anti-inflammatories, topical anti-inflammatories and we will start again on nitroglycerin which worked well. Patient was put in a [employer]. Likely will do well with conservative therapy will follow-up again in 2 weeks.

## 2016-10-16 NOTE — Progress Notes (Signed)
[name] [place], [place] [address] Phone: [phone] Subjective:    I'm seeing this patient by the request  of:  [name], MD   CC:  Left ankle pain  GDJ:[id number]  ILMA ACHEE is a 60 y.o. female coming in with complaint of left ankle pain. We have seen patient previously for an Achilles tendinosis. Patient states that she was walking 5 days ago and had significant amount pain on the posterior aspect of the ankle. The stem more on the left than the right. States that she did have some swelling. Very difficult to walk. Patient states that now no significant improvement. Patient states that it seems to be stable and not worsening. Swelling has improved somewhat. Still rates the severity of pain a 7 out of 10. Does not remember exactly what helped last time except for the nitroglycerin patches.     Past Medical History:  Diagnosis Date  . Arthritis    knees, back  . Carcinoid tumor of lung 2012   right found incidentally on chest xray eval for r/o vasxculitis   . Coronary artery calcification 07/23/2016  . Double vision    history, no current problem  . Dysrhythmia    irregular occassional, no current problem  . Endometrial cells on cervical Pap smear inconsistent w/LMP 6/11   benign proliferative endometrium on endometrial biopsy  . HNP (herniated nucleus pulposus), cervical 01/22/2011   C6-C7  Has seen dr Vertell Limber for this and Dr Tonita Cong  Positional numbness in hands no weakness    . Hypertension   . Hypothyroidism   . 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 Sleep apnea    uses CPAP nightly  . SUI (stress urinary incontinence, female)    h/o   Past Surgical History:  Procedure Laterality Date  . CESAREAN SECTION     x 1 - twins  . COLONOSCOPY  07/31/05   brodie - polyps and bx's  . DILATATION & CURRETTAGE/HYSTEROSCOPY WITH RESECTOCOPE N/A 07/18/2013   Procedure: DILATATION & CURETTAGE/HYSTEROSCOPY WITH  RESECTOCOPE;  Surgeon: Lyman Speller, MD;  Location: [facility];  Service: Gynecology;  Laterality: N/A;  mass resection  . Lung tumor removed     Rt lung carcinoid  . SHOULDER OPEN ROTATOR CUFF REPAIR  07/20/2010   Right  . WISDOM TOOTH EXTRACTION     Social History   Social History  . Marital status: Married    Spouse name: N/A  . Number of children: 3  . Years of education: N/A   Occupational History  . retied [employer]   Social History Main Topics  . Smoking status: Never Smoker  . Smokeless tobacco: Never Used  . Alcohol use 1.2 oz/week    2 Glasses of wine per week  . Drug use: No  . Sexual activity: Yes    Partners: Male    Birth control/ protection: None, Post-menopausal   Other Topics Concern  . None   Social History Narrative   hhof 2    2 Children at college and  beyond      [name] full time   Married    Retired age 40 2015   Allergies  Allergen Reactions  . Neomycin     REACTION: Inflammation  . Ace Inhibitors Cough   Family History  Problem Relation Age of Onset  . Stroke Mother     died age 72  . Hypertension Mother   . Coronary artery  disease Father     died age 53  . Hypertension Father   . Parkinson's disease Brother   . Diabetes Brother   . Hypertension Maternal Uncle   . Hypertension Brother     x2  . Colon cancer Neg Hx   . Rectal cancer Neg Hx   . Stomach cancer Neg Hx     Past medical history, social, surgical and family history all reviewed in electronic medical record.  No pertanent information unless stated regarding to the chief complaint.   Review of Systems:Review of systems updated and as accurate as of 10/16/16  No headache, visual changes, nausea, vomiting, diarrhea, constipation, dizziness, abdominal pain, skin rash, fevers, chills, night sweats, weight loss, swollen lymph nodes, body aches, joint swelling, muscle aches, chest pain, shortness of breath, mood changes.   Objective  Blood  pressure 128/90, pulse 66, height 5' (1.524 m), weight 214 lb 9.6 oz (97.3 kg), last menstrual period 02/12/2011. Systems examined below as of 10/16/16   General: No apparent distress alert and oriented x3 mood and affect normal, dressed appropriately.  HEENT: Pupils equal, extraocular movements intact  Respiratory: Patient's speak in full sentences and does not appear short of breath  Cardiovascular: No lower extremity edema, non tender, no erythema  Skin: Warm dry intact with no signs of infection or rash on extremities or on axial skeleton.  Abdomen: Soft nontender  Neuro: Cranial nerves II through XII are intact, neurovascularly intact in all extremities with 2+ DTRs and 2+ pulses.  Lymph: No lymphadenopathy of posterior or anterior cervical chain or axillae bilaterally.  Antalgic gait favoring the left leg MSK:  Non tender with full range of motion and good stability and symmetric strength and tone of shoulders, elbows, wrist, hip, knee and bilaterally.  Ankle: Left Trace swelling . Limitation in dorsiflexion secondary to pain. Strength is 4/5 in all directions. Tender to palpation over the posterior aspect of the Achilles at its insertion Talar dome nontender; Negative tarsal tunnel tinel's Contralateral ankle unremarkable Pes planus noted bilaterally  MSK US performed of: Left This study was ordered, performed, and interpreted by Charlann Boxer D.O.  Foot/Ankle:   All structures visualized.   Achilles tendon seems to have some intersubstance tearing at its insertion with some calcific changes. Significant increase in Doppler flow noted. Patient approximate 3 cm proximal to this area also has increasing Doppler flow of the tendon as well as the underlying area likely the soleus muscle.  IMPRESSION:  Partial Achilles tear with soleus tear     Impression and Recommendations:     This case required medical decision making of moderate complexity.      Note: This dictation was  prepared with Dragon dictation along with smaller phrase technology. Any transcriptional errors that result from this process are unintentional.

## 2016-10-16 NOTE — Assessment & Plan Note (Signed)
Encourage weight loss. 

## 2016-10-17 ENCOUNTER — Encounter: Payer: Self-pay | Admitting: Family Medicine

## 2016-10-20 MED ORDER — NITROGLYCERIN 0.2 MG/HR TD PT24: MEDICATED_PATCH | TRANSDERMAL | 1 refills | Status: DC

## 2016-10-28 NOTE — Progress Notes (Signed)
[facility] [place], [place] [address] Phone: [phone] Subjective:    CC:  Left ankle pain f/u  [id number]  Nichole Smith is a 60 y.o. female coming in with complaint of left ankle pain. We have seen patient previously for an Achilles tendinosis. Patient was seen recently and did have more of an Achilles tear. Seems to be actually more of the soleus muscle. Started on nitroglycerin again as well as been in a Dispensing optician for 2 weeks. Patient has been doing an icing protocol. Patient states feeling significantly better. Feels though that the boot is starting to aggravate her knee. Denies any significant swelling. States that she has walked without the boot and states that overall seems that it is very minimally painful. Happy with the results of far.  Patient will remain active.    Past Medical History:  Diagnosis Date  . Arthritis    knees, back  . Carcinoid tumor of lung 2012   right found incidentally on chest xray eval for r/o vasxculitis   . Coronary artery calcification 07/23/2016  . Double vision    history, no current problem  . Dysrhythmia    irregular occassional, no current problem  . Endometrial cells on cervical Pap smear inconsistent w/LMP 6/11   benign proliferative endometrium on endometrial biopsy  . HNP (herniated nucleus pulposus), cervical 01/22/2011   C6-C7  Has seen dr Vertell Limber for this and Dr Tonita Cong  Positional numbness in hands no weakness    . Hypertension   . Hypothyroidism   . 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 Sleep apnea    uses CPAP nightly  . SUI (stress urinary incontinence, female)    h/o   Past Surgical History:  Procedure Laterality Date  . CESAREAN SECTION     x 1 - twins  . COLONOSCOPY  07/31/05   brodie - polyps and bx's  . DILATATION & CURRETTAGE/HYSTEROSCOPY WITH RESECTOCOPE N/A 07/18/2013   Procedure: DILATATION & CURETTAGE/HYSTEROSCOPY WITH RESECTOCOPE;  Surgeon: Lyman Speller, MD;  Location: [facility];  Service: Gynecology;  Laterality: N/A;  mass resection  . Lung tumor removed     Rt lung carcinoid  . SHOULDER OPEN ROTATOR CUFF REPAIR  07/20/2010   Right  . WISDOM TOOTH EXTRACTION     Social History   Social History  . Marital status: Married    Spouse name: N/A  . Number of children: 3  . Years of education: N/A   Occupational History  . retied [employer]   Social History Main Topics  . Smoking status: Never Smoker  . Smokeless tobacco: Never Used  . Alcohol use 1.2 oz/week    2 Glasses of wine per week  . Drug use: No  . Sexual activity: Yes    Partners: Male    Birth control/ protection: None, Post-menopausal   Other Topics Concern  . None   Social History Narrative   hhof 2    2 Children at college and  beyond      [name] full time   Married    Retired age 13 2015   Allergies  Allergen Reactions  . Neomycin     REACTION: Inflammation  . Ace Inhibitors Cough   Family History  Problem Relation Age of Onset  . Stroke Mother     died age 16  . Hypertension Mother   . Coronary artery disease Father     died age 78  .  Hypertension Father   . Parkinson's disease Brother   . Diabetes Brother   . Hypertension Maternal Uncle   . Hypertension Brother     x2  . Colon cancer Neg Hx   . Rectal cancer Neg Hx   . Stomach cancer Neg Hx     Past medical history, social, surgical and family history all reviewed in electronic medical record.  No pertanent information unless stated regarding to the chief complaint.   Review of Systems:Review of systems updated and as accurate as of 10/29/16  No headache, visual changes, nausea, vomiting, diarrhea, constipation, dizziness, abdominal pain, skin rash, fevers, chills, night sweats, weight loss, swollen lymph nodes, body aches, joint swelling, muscle aches, chest pain, shortness of breath, mood changes.   Objective  Blood pressure 130/80, pulse 60, height  5' (1.524 m), last menstrual period 02/12/2011, SpO2 95 %.   Systems examined below as of 10/29/16 General: NAD A&O x3 mood, affect normal obese.  HEENT: Pupils equal, extraocular movements intact no nystagmus Respiratory: not short of breath at rest or with speaking Cardiovascular: No lower extremity edema, non tender Skin: Warm dry intact with no signs of infection or rash on extremities or on axial skeleton. Abdomen: Soft nontender, no masses Neuro: Cranial nerves  intact, neurovascularly intact in all extremities with 2+ DTRs and 2+ pulses. Lymph: No lymphadenopathy appreciated today  Gait normal with good balance and coordination.  MSK: Non tender with full range of motion and good stability and symmetric strength and tone of shoulders, elbows, wrist,  knee hips bilaterally.    Ankle: Left No swelling noted Limitation in dorsiflexion secondary to pain. Strength is 4/5 in all directions. Minimal tenderness at the insertion of the Achilles Negative tarsal tunnel tinel's Contralateral ankle unremarkable Pes planus noted bilaterally Improvement from previous exam Contralateral ankle unremarkable.  MSK US performed of: Left This study was ordered, performed, and interpreted by Charlann Boxer D.O.  Foot/Ankle:   All structures visualized.   Achilles does show that patient does have good scar tissue formation. Area where the tear was seems to be significant improvement with increased neovascularization.  IMPRESSION:  Partial Achilles and soleus shows improvement     Impression and Recommendations:     This case required medical decision making of moderate complexity.      Note: This dictation was prepared with Dragon dictation along with smaller phrase technology. Any transcriptional errors that result from this process are unintentional.

## 2016-10-29 ENCOUNTER — Encounter: Payer: Self-pay | Admitting: Family Medicine

## 2016-10-29 ENCOUNTER — Ambulatory Visit (INDEPENDENT_AMBULATORY_CARE_PROVIDER_SITE_OTHER): Admitting: Family Medicine

## 2016-10-29 ENCOUNTER — Ambulatory Visit: Payer: Self-pay

## 2016-10-29 VITALS — BP 130/80 | HR 60 | Ht 60.0 in

## 2016-10-29 DIAGNOSIS — M79672 Pain in left foot: Secondary | ICD-10-CM | POA: Diagnosis not present

## 2016-10-29 DIAGNOSIS — M7661 Achilles tendinitis, right leg: Secondary | ICD-10-CM

## 2016-10-29 DIAGNOSIS — M216X2 Other acquired deformities of left foot: Secondary | ICD-10-CM

## 2016-10-29 NOTE — Assessment & Plan Note (Signed)
Encourage weight loss. 

## 2016-10-29 NOTE — Assessment & Plan Note (Signed)
Making improvement at this time. Patient's ultrasound shows good healing. Patient was going to start to titrate off the natural history over the course the next 4-6 weeks. Patient will continue to be active otherwise. We discussed icing regimen. Discussed proper shoes. We'll start to slowly come out of the [employer]. May need custom orthotics in the long run.

## 2016-10-29 NOTE — Assessment & Plan Note (Signed)
May need custom orthotics in the long run. We'll consider once patient is pain free from the Achilles.

## 2016-10-29 NOTE — Patient Instructions (Addendum)
good to see you  We are on the mend   OK to come out of the boot and try a shoe with a heel lift.  ICe still maybe at night I am good with water aerobics, and biking.  OK to walk in the boot.  OK to do yoga but be careful.  Continue the nitro patch daily for 3 weeks then every other day until I see you  See me again in 4 weeks.  Happy holidays!

## 2016-11-03 ENCOUNTER — Other Ambulatory Visit (INDEPENDENT_AMBULATORY_CARE_PROVIDER_SITE_OTHER)

## 2016-11-03 ENCOUNTER — Ambulatory Visit: Admitting: Family Medicine

## 2016-11-03 DIAGNOSIS — E7889 Other lipoprotein metabolism disorders: Secondary | ICD-10-CM | POA: Diagnosis not present

## 2016-11-03 DIAGNOSIS — E7841 Elevated Lipoprotein(a): Secondary | ICD-10-CM

## 2016-11-03 LAB — ALT: ALT: 31 U/L — AB (ref 6–29)

## 2016-11-09 ENCOUNTER — Encounter: Payer: Self-pay | Admitting: Internal Medicine

## 2016-11-10 LAB — CARDIO IQ(R) ADVANCED LIPID PANEL
APOLIPOPROTEIN (CARDIO IQ ADV LIPID PANEL): 47 mg/dL — AB (ref 49–103)
CHOLESTEROL, TOTAL (CARDIO IQ ADV LIPID PANEL): 110 mg/dL (ref <200)
CHOLESTEROL/HDL RATIO (CARDIO IQ ADV LIPID PANEL): 2.3 calc (ref <5.0)
HDL CHOLESTEROL (CARDIO IQ ADV LIPID PANEL): 48 mg/dL — AB (ref >50)
LDL CHOLESTEROL CALCULATED (CARDIO IQ ADV LIPID PANEL): 49 mg/dL (ref <100)
LDL LARGE: 5417 nmol/L (ref 5038–17886)
LDL Medium: 94 nmol/L — ABNORMAL LOW (ref 121–397)
LDL PARTICLE NUMBER: 498 nmol/L — AB (ref 1016–2185)
LDL Peak Size: 222.9 Angstrom (ref >=218.2)
LDL Small: 83 nmol/L — ABNORMAL LOW (ref 115–386)
Lipoprotein (a): 459 nmol/L — ABNORMAL HIGH (ref <75)
NON-HDL CHOLESTEROL (CARDIO IQ ADV LIPID PANEL): 62 mg/dL (ref <130)
TRIGLYCERIDES (CARDIO IQ ADV LIPID PANEL): 54 mg/dL (ref <150)

## 2016-11-10 MED ORDER — HYDROCHLOROTHIAZIDE 12.5 MG PO CAPS: 12.5000 mg | ORAL_CAPSULE | Freq: Every day | ORAL | 0 refills | Status: DC

## 2016-11-12 ENCOUNTER — Encounter: Payer: Self-pay | Admitting: Cardiology

## 2016-11-12 NOTE — Telephone Encounter (Signed)
[name] is returning a call

## 2016-11-12 NOTE — Telephone Encounter (Signed)
This encounter was created in error - please disregard.

## 2016-11-13 ENCOUNTER — Telehealth: Payer: Self-pay | Admitting: Cardiology

## 2016-11-13 NOTE — Telephone Encounter (Signed)
-----   Message from Leeroy Bock, [facility] sent at 11/11/2016  3:27 PM EST ----- Would recommend continuing current therapy, LDL and LDL particle number are both excellent. LDL slightly improved from 53 to 49 since increasing Lipitor dose, however Lp(a) actually increased. This is her only elevated reading. However, we do not have great medications to lower Lp(a) other than niacin which lacks outcomes data and PCSK9i which pt would not qualify for with LDL of 49. Her particle number and LDL are in a correct 10:1 ratio and ApoB is at goal too. Would recommend continuing current therapy. I do have pt's name on a list for those with elevated Lp(a) - Dr Lia Foyer had previously mentioned the possibility of a clinical trial looking at patients with isolated elevated Lp(a). Will follow up as needed.

## 2016-11-13 NOTE — Telephone Encounter (Signed)
New message ° ° ° °Pt verbalized that she is returning call for rn  °

## 2016-11-13 NOTE — Telephone Encounter (Signed)
Informed patient of results. Patient has read a lot of material on Lp(a) and is very concerned.  Informed her there are no ideal options to decrease Lp(a) and instructed her to continue current therapy. Also informed her that she will be contacted if the Research Department conducts clinical trial. She is concerned the Lipitor is not doing anything for her and she does not want to continue taking the medication. She states her numbers looked fine prior to starting the medication a couple months ago (although results over the last few months were discussed and she understands her other numbers have improved on the Lipitor).  The patient would like to stop taking Lipitor and try niacin despite the lack of outcomes data. The patient requests an appointment to discuss medication options. Scheduled patient 12/19 in the [facility] for further discussion. She was grateful for call.

## 2016-11-18 ENCOUNTER — Ambulatory Visit (INDEPENDENT_AMBULATORY_CARE_PROVIDER_SITE_OTHER): Admitting: Pharmacist

## 2016-11-18 DIAGNOSIS — I7090 Unspecified atherosclerosis: Secondary | ICD-10-CM | POA: Diagnosis not present

## 2016-11-18 DIAGNOSIS — I2510 Atherosclerotic heart disease of native coronary artery without angina pectoris: Secondary | ICD-10-CM

## 2016-11-18 DIAGNOSIS — I2584 Coronary atherosclerosis due to calcified coronary lesion: Secondary | ICD-10-CM

## 2016-11-18 NOTE — Progress Notes (Signed)
Patient ID: Nichole Smith                 DOB: July 16, 1956                    MRN: [id number]     HPI: Nichole Smith is a 60 y.o. female patient referred to lipid clinic by Dr Radford Pax. PMH is significant for coronary artery calcifications as well as calcification in the aortic arch and carotid bifurcation on recent CT, HTN, and sleep apnea. Pt underwent stress myoview to rule out ischemia which came back negative. Pt was started on Lipitor, aspirin, and encouraged to lose weight. Her recent cardio IQ lipid panel showed LDL improved to 49, LDL-P 498 at goal, ApoB 47 at goal, but elevated Lp(a). Patient has concerns over elevated Lp(a) and presents today to discuss.  Patient has a lot of questions regarding the results of her advanced lipid panel testing, her liver enzymes, risk for cardiac disease, and overall medication questions. She questions whether she needs to stay on her Lipitor. Her 10 year ASCVD risk is 5.3%, however this does not account for family history. Her mother had a stroke at age 21 but pt reports that she smoked and never took her BP medication. Her father had CAD but died from cirrhosis at age 56; he was a heavy drinker.  Current Medications: atorvastatin '40mg'$  daily, fish oil 1g daily Intolerances: none Risk Factors: coronary calcification, elevated Lp(a) LDL goal: '70mg'$ /dL  Family History: Coronary artery disease in her father (passed from cirrhosis at age 14); Diabetes in her brother; Hypertension in her brother, father, maternal uncle, and mother; Parkinson's disease in her brother; Stroke in her mother at age 58.   Social History: Denies smoking, drinks 2 glasses of wine per week, and denies illicit drug use.  Labs: 11/03/16: TC 110, HDL 48, TG 54, LDL 49, LDL-P 498, ApoB 47, Lp(a) 459 (atorvastatin '40mg'$  daily)  Past Medical History:  Diagnosis Date  . Arthritis    knees, back  . Carcinoid tumor of lung 2012   right found incidentally on chest xray eval for r/o  vasxculitis   . Coronary artery calcification 07/23/2016  . Double vision    history, no current problem  . Dysrhythmia    irregular occassional, no current problem  . Endometrial cells on cervical Pap smear inconsistent w/LMP 6/11   benign proliferative endometrium on endometrial biopsy  . HNP (herniated nucleus pulposus), cervical 01/22/2011   C6-C7  Has seen dr Vertell Limber for this and Dr Tonita Cong  Positional numbness in hands no weakness    . Hypertension   . Hypothyroidism   . 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 Sleep apnea    uses CPAP nightly  . SUI (stress urinary incontinence, female)    h/o    Current Outpatient Prescriptions on File Prior to Visit  Medication Sig Dispense Refill  . amLODipine (NORVASC) 5 MG tablet TAKE 1 TABLET DAILY 90 tablet 2  . aspirin 81 MG tablet Take 81 mg by mouth daily.    [name] atorvastatin (LIPITOR) 40 MG tablet Take 1 tablet (40 mg total) by mouth daily. 90 tablet 3  . CALCIUM PO Take by mouth 2 (two) times daily.    . Cholecalciferol (VITAMIN D) 2000 UNITS CAPS Take by mouth.    . cyclobenzaprine (FLEXERIL) 5 MG tablet Take 1 tablet (5 mg total) by mouth 3 (three) times daily as needed for muscle spasms. 30 tablet 1  . hydrochlorothiazide (  MICROZIDE) 12.5 MG capsule Take 1 capsule (12.5 mg total) by mouth daily. 90 capsule 0  . lidocaine (XYLOCAINE) 2 % jelly Apply 1 application topically as needed. For pain up to qid 30 mL 0  . methocarbamol (ROBAXIN) 750 MG tablet 1-2 po tid  PO for muscle spasm 60 tablet 1  . metoprolol succinate (TOPROL-XL) 100 MG 24 hr tablet Take 100 mg by mouth daily. Take with or immediately following a meal. PT takes 50 mg daily    . Multiple Vitamins-Minerals (EYE VITAMINS PO) Take 1 capsule by mouth 2 (two) times daily. "Eye caps"    . Multiple Vitamins-Minerals (MULTIVITAMIN PO) Take 1 tablet by mouth daily.     . nitroGLYCERIN (NITRODUR - DOSED IN MG/24 HR) 0.2 mg/hr patch 1/4 patch daily 30 patch 1  . nystatin  (MYCOSTATIN/NYSTOP) 100000 UNIT/GM POWD Apply twice daily as needed 15 g 3  . Omega-3 Fatty Acids (FISH OIL PO) Take 1 capsule by mouth daily.     [name] SYNTHROID 137 MCG tablet TAKE 1 TABLET DAILY 90 tablet 1  . triamcinolone ointment (KENALOG) 0.5 % Apply 1 application topically 2 (two) times daily. Can use for up to 7 days with flare. 60 g 3  . valACYclovir (VALTREX) 1000 MG tablet Take 1 tablet (1,000 mg total) by mouth 3 (three) times daily. 21 tablet 0   No current facility-administered medications on file prior to visit.     Allergies  Allergen Reactions  . Neomycin     REACTION: Inflammation  . Ace Inhibitors Cough    Assessment/Plan:  1. Hyperlipidemia - Discussed advanced lipid panel testing with pt at length. Her LDL, LDL-P, and apoB are all excellent and within goal. Her only reading out of range is her Lp(a) which is a genetic risk factor and difficult to modify. Discussed the benefit of her continuing her statin therapy as well as pros vs. cons of niacin therapy which can lower Lp(a) ~25%. However, since niacin has many side effects and it is not known if lowering Lp(a) correlates to lower CVD risk, would hesitate to start therapy today. Given lack of outcomes data, pt agrees with not starting niacin today. All of patient's questions were answered and she is willing to continue her Lipitor therapy.   Megan E. Supple, PharmD, CPP, [facility] [address] [place], [place] [address] Phone: [phone]; Fax: [phone] 11/18/2016 10:15 AM

## 2016-11-25 NOTE — Progress Notes (Signed)
[facility] [place], [place] [address] Phone: [phone] Subjective:    CC:  Left ankle pain f/u  [id number]  Nichole Smith is a 60 y.o. female coming in with complaint of left ankle pain. We have seen patient previously for an Achilles tendinosis. Patient was seen recently and did have more of an Achilles tear. Seems to be actually more of the soleus muscle. Started on nitroglycerin again as well as been in a Dispensing optician for 2 weeks. Was doing better at follow-up. Patient was to advance activity. Patient states  Making progress. Seems to be slowly. Patient states continuing to have some mild discomfort but overall seems to be making benefit. Patient denies any radiation down the legs any numbness or tingling. States though that sometimes can have a severe sharp pain but only last seconds.    Past Medical History:  Diagnosis Date  . Arthritis    knees, back  . Carcinoid tumor of lung 2012   right found incidentally on chest xray eval for r/o vasxculitis   . Coronary artery calcification 07/23/2016  . Double vision    history, no current problem  . Dysrhythmia    irregular occassional, no current problem  . Endometrial cells on cervical Pap smear inconsistent w/LMP 6/11   benign proliferative endometrium on endometrial biopsy  . HNP (herniated nucleus pulposus), cervical 01/22/2011   C6-C7  Has seen dr Vertell Limber for this and Dr Tonita Cong  Positional numbness in hands no weakness    . Hypertension   . Hypothyroidism   . 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 Sleep apnea    uses CPAP nightly  . SUI (stress urinary incontinence, female)    h/o   Past Surgical History:  Procedure Laterality Date  . CESAREAN SECTION     x 1 - twins  . COLONOSCOPY  07/31/05   brodie - polyps and bx's  . DILATATION & CURRETTAGE/HYSTEROSCOPY WITH RESECTOCOPE N/A 07/18/2013   Procedure: DILATATION & CURETTAGE/HYSTEROSCOPY WITH RESECTOCOPE;  Surgeon: Lyman Speller, MD;  Location: [facility];  Service: Gynecology;  Laterality: N/A;  mass resection  . Lung tumor removed     Rt lung carcinoid  . SHOULDER OPEN ROTATOR CUFF REPAIR  07/20/2010   Right  . WISDOM TOOTH EXTRACTION     Social History   Social History  . Marital status: Married    Spouse name: N/A  . Number of children: 3  . Years of education: N/A   Occupational History  . retied [employer]   Social History Main Topics  . Smoking status: Never Smoker  . Smokeless tobacco: Never Used  . Alcohol use 1.2 oz/week    2 Glasses of wine per week  . Drug use: No  . Sexual activity: Yes    Partners: Male    Birth control/ protection: None, Post-menopausal   Other Topics Concern  . None   Social History Narrative   hhof 2    2 Children at college and  beyond      [name] full time   Married    Retired age 31 2015   Allergies  Allergen Reactions  . Neomycin     REACTION: Inflammation  . Ace Inhibitors Cough   Family History  Problem Relation Age of Onset  . Stroke Mother     died age 94  . Hypertension Mother   . Coronary artery disease Father     died  age 60  . Hypertension Father   . Parkinson's disease Brother   . Diabetes Brother   . Hypertension Maternal Uncle   . Hypertension Brother     x2  . Colon cancer Neg Hx   . Rectal cancer Neg Hx   . Stomach cancer Neg Hx     Past medical history, social, surgical and family history all reviewed in electronic medical record.  No pertanent information unless stated regarding to the chief complaint.   Review of Systems: No headache, visual changes, nausea, vomiting, diarrhea, constipation, dizziness, abdominal pain, skin rash, fevers, chills, night sweats, weight loss, swollen lymph nodes, body aches, joint swelling, muscle aches, chest pain, shortness of breath, mood changes.     Objective  Blood pressure 124/82, pulse (!) 58, height 5' (1.524 m), weight 218 lb (98.9 kg), last  menstrual period 02/12/2011, SpO2 97 %.   Systems examined below as of 11/26/16 General: NAD A&O x3 mood, affect normal obese.  HEENT: Pupils equal, extraocular movements intact no nystagmus Respiratory: not short of breath at rest or with speaking Cardiovascular: No lower extremity edema, non tender Skin: Warm dry intact with no signs of infection or rash on extremities or on axial skeleton. Abdomen: Soft nontender, no masses Neuro: Cranial nerves  intact, neurovascularly intact in all extremities with 2+ DTRs and 2+ pulses. Lymph: No lymphadenopathy appreciated today  Gait normal with good balance and coordination. No difficulty with walking. MSK: Non tender with full range of motion and good stability and symmetric strength and tone of shoulders, elbows, wrist,  knee hips bilaterally.    Ankle: Left No swelling noted Very mild limitation in dorsiflexion. Significant improvement. Strength is 4/5 in all directions. Minimal tenderness at the insertion of the Achilles Negative tarsal tunnel tinel's Contralateral ankle unremarkable Pes planus noted bilaterally Improvement from previous exam Contralateral ankle unremarkable.  MSK US performed of: Left This study was ordered, performed, and interpreted by Charlann Boxer D.O.  Foot/Ankle:   Patient does show improvement. Does have calcific changes of the Achilles noted. Patient's tear that seems to be improving.  IMPRESSION:  Partial Achilles and soleus shows improvement but does have calcific changes.     Impression and Recommendations:     This case required medical decision making of moderate complexity.      Note: This dictation was prepared with Dragon dictation along with smaller phrase technology. Any transcriptional errors that result from this process are unintentional.

## 2016-11-26 ENCOUNTER — Encounter: Payer: Self-pay | Admitting: Family Medicine

## 2016-11-26 ENCOUNTER — Ambulatory Visit (INDEPENDENT_AMBULATORY_CARE_PROVIDER_SITE_OTHER): Admitting: Family Medicine

## 2016-11-26 ENCOUNTER — Ambulatory Visit: Payer: Self-pay

## 2016-11-26 VITALS — BP 124/82 | HR 58 | Ht 60.0 in | Wt 218.0 lb

## 2016-11-26 DIAGNOSIS — M7661 Achilles tendinitis, right leg: Secondary | ICD-10-CM | POA: Diagnosis not present

## 2016-11-26 DIAGNOSIS — S86012A Strain of left Achilles tendon, initial encounter: Secondary | ICD-10-CM

## 2016-11-26 MED ORDER — VITAMIN D (ERGOCALCIFEROL) 1.25 MG (50000 UNIT) PO CAPS: 50000.0000 [IU] | ORAL_CAPSULE | ORAL | 0 refills | Status: DC

## 2016-11-26 NOTE — Assessment & Plan Note (Signed)
Patient is a Achilles tear that seems to be resolving at this time. Encourage patient to increase her vitamin D and given up her scratching second due to the calcific changes. Patient given new exercises to try to increase activity. Decline formal physical therapy. We discussed proper shoes and continuing the heel lift. Follow-up again in 4 weeks with patient shaving the 90% better.

## 2016-11-26 NOTE — Assessment & Plan Note (Signed)
Patient is a [place] Achilles tear that seems to be resolving at this time. Encourage patient to increase her vitamin D and given up her scratching second due to the calcific changes. Patient given new exercises to try to increase activity. Decline formal physical therapy. We discussed proper shoes and continuing the heel lift. Follow-up again in 4 weeks with patient shaving the 90% better.

## 2016-11-26 NOTE — Patient Instructions (Signed)
Great to see you  Nichole Smith is your friend.  I think you are doing great  Lets start a once weekly vitamin D Continue the nitro 3 times a week for another 3 weeks then discontinue.  [place] and Altrex could be good shoes I would add a heel lift in your shoe See me again in 4 weeks for hopefully the last time.  Happy New Year!

## 2016-12-04 ENCOUNTER — Encounter: Payer: Self-pay | Admitting: Internal Medicine

## 2016-12-04 ENCOUNTER — Ambulatory Visit (INDEPENDENT_AMBULATORY_CARE_PROVIDER_SITE_OTHER): Admitting: Internal Medicine

## 2016-12-04 VITALS — BP 146/82 | Temp 98.3°F | Wt 210.0 lb

## 2016-12-04 DIAGNOSIS — J019 Acute sinusitis, unspecified: Secondary | ICD-10-CM

## 2016-12-04 MED ORDER — AMOXICILLIN-POT CLAVULANATE 875-125 MG PO TABS: 1.0000 | ORAL_TABLET | Freq: Two times a day (BID) | ORAL | 0 refills | Status: DC

## 2016-12-04 MED ORDER — HYDROCODONE-HOMATROPINE 5-1.5 MG/5ML PO SYRP: 5.0000 mL | ORAL_SOLUTION | ORAL | 0 refills | Status: DC | PRN

## 2016-12-04 NOTE — Patient Instructions (Signed)
I believe you have a sinusitis a bacterial infection on top of a viral respiratory infection. Nasal saline Flonase nasal spray or similar and Afrin nose spray as needed for severe congestion but do not take more than 3 days in a row. Warm compresses antibiotic. Cough medicine as needed. Expect significant improvement in the next 3-5 days. Keep your appointment for next week. Contact us if fever or severe worsening.

## 2016-12-04 NOTE — Progress Notes (Signed)
Pre visit review using our clinic review tool, if applicable. No additional management support is needed unless otherwise documented below in the visit note. 

## 2016-12-04 NOTE — Progress Notes (Signed)
Chief Complaint  Patient presents with  . Nasal Congestion    X10 dsys  . Sinus Pressure/Pain  . Headache  . Cough    HPI: Nichole Smith 61 y.o. comes in today because of ongoing symptoms of upper respiratory congestion cough and facial pressure. Onset like a cold she thought but is getting worse instead of better. No fever and chills but low-grade possibly. Eyes are puffy. Nose is clogged coughing some thick nasal drainage. Is monitoring her blood pressure up today but is generally been in the 1:30 range. No chest pain shortness of breath as for cough medicine. ROS: See pertinent positives and negatives per HPI.  Past Medical History:  Diagnosis Date  . Arthritis    knees, back  . Carcinoid tumor of lung 2012   right found incidentally on chest xray eval for r/o vasxculitis   . Coronary artery calcification 07/23/2016  . Double vision    history, no current problem  . Dysrhythmia    irregular occassional, no current problem  . Endometrial cells on cervical Pap smear inconsistent w/LMP 6/11   benign proliferative endometrium on endometrial biopsy  . HNP (herniated nucleus pulposus), cervical 01/22/2011   C6-C7  Has seen dr Vertell Limber for this and Dr Tonita Cong  Positional numbness in hands no weakness    . Hypertension   . Hypothyroidism   . 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 Sleep apnea    uses CPAP nightly  . SUI (stress urinary incontinence, female)    h/o    Family History  Problem Relation Age of Onset  . Stroke Mother     died age 80  . Hypertension Mother   . Coronary artery disease Father     died age 19  . Hypertension Father   . Parkinson's disease Brother   . Diabetes Brother   . Hypertension Maternal Uncle   . Hypertension Brother     x2  . Colon cancer Neg Hx   . Rectal cancer Neg Hx   . Stomach cancer Neg Hx     Social History   Social History  . Marital status: Married    Spouse name: N/A  . Number of children: 3  . Years of  education: N/A   Occupational History  . retied [employer]   Social History Main Topics  . Smoking status: Never Smoker  . Smokeless tobacco: Never Used  . Alcohol use 1.2 oz/week    2 Glasses of wine per week  . Drug use: No  . Sexual activity: Yes    Partners: Male    Birth control/ protection: None, Post-menopausal   Other Topics Concern  . None   Social History Narrative   hhof 2    2 Children at college and  beyond      [name] full time   Married    Retired age 48 2015    Outpatient Medications Prior to Visit  Medication Sig Dispense Refill  . amLODipine (NORVASC) 5 MG tablet TAKE 1 TABLET DAILY 90 tablet 2  . aspirin 81 MG tablet Take 81 mg by mouth daily.    [name] atorvastatin (LIPITOR) 40 MG tablet Take 1 tablet (40 mg total) by mouth daily. 90 tablet 3  . CALCIUM PO Take by mouth 2 (two) times daily.    . Cholecalciferol (VITAMIN D) 2000 UNITS CAPS Take by mouth.    . hydrochlorothiazide (MICROZIDE) 12.5 MG capsule Take 1 capsule (12.5 mg total) by mouth  daily. 90 capsule 0  . methocarbamol (ROBAXIN) 750 MG tablet 1-2 po tid  PO for muscle spasm 60 tablet 1  . metoprolol succinate (TOPROL-XL) 100 MG 24 hr tablet Take 50 mg by mouth daily. Take with or immediately following a meal. PT takes 50 mg daily     . Multiple Vitamins-Minerals (EYE VITAMINS PO) Take 1 capsule by mouth 2 (two) times daily. "Eye caps"    . Multiple Vitamins-Minerals (MULTIVITAMIN PO) Take 1 tablet by mouth daily.     . nitroGLYCERIN (NITRODUR - DOSED IN MG/24 HR) 0.2 mg/hr patch 1/4 patch daily 30 patch 1  . Omega-3 Fatty Acids (FISH OIL PO) Take 1 capsule by mouth daily.     [name] SYNTHROID 137 MCG tablet TAKE 1 TABLET DAILY 90 tablet 1  . triamcinolone ointment (KENALOG) 0.5 % Apply 1 application topically 2 (two) times daily. Can use for up to 7 days with flare. 60 g 3  . Vitamin D, Ergocalciferol, (DRISDOL) 50000 units CAPS capsule Take 1 capsule (50,000 Units total)  by mouth every 7 (seven) days. 12 capsule 0   No facility-administered medications prior to visit.      EXAM:  BP (!) 146/82 (BP Location: Right Arm, Patient Position: Sitting, Cuff Size: Large)   Temp 98.3 F (36.8 C) (Oral)   Wt 210 lb (95.3 kg)   LMP 02/12/2011   BMI 41.01 kg/m   Body mass index is 41.01 kg/m. .WDWN in NAD  quiet respirations; mildly congested  somewhat hoarse. Non toxic . Has some obvious. Orbital swelling but no redness. HEENT: Normocephalic ;atraumatic , Eyes;  PERRL, EOMs  Full, lids and conjunctiva clear,,Ears: no deformities, canals nl, TM landmarks normal, Nose: no deformity or discharge but very congested face minimally tender Mouth : OP clear without lesion or edema . But is red. Neck: Supple without adenopathy or masses or bruits Chest:  Clear to A&P without wheezes rales or rhonchi CV:  S1-S2 no gallops or murmurs peripheral perfusion is normal Skin :nl perfusion and no acute rashes  r     ASSESSMENT AND PLAN:  Discussed the following assessment and plan:  Acute sinusitis with symptoms greater than 10 days - No significant fever but periorbital puffiness progressive symptoms. Antibiotic sinus hygiene. Has follow-up next week anyway  -Patient advised to return or notify health care team  if symptoms worsen ,persist or new concerns arise.  Patient Instructions  I believe you have a sinusitis a bacterial infection on top of a viral respiratory infection. Nasal saline Flonase nasal spray or similar and Afrin nose spray as needed for severe congestion but do not take more than 3 days in a row. Warm compresses antibiotic. Cough medicine as needed. Expect significant improvement in the next 3-5 days. Keep your appointment for next week. Contact us if fever or severe worsening.    Standley Brooking. Jamarco Zaldivar M.D.

## 2016-12-07 NOTE — Progress Notes (Signed)
Pre visit review using our clinic review tool, if applicable. No additional management support is needed unless otherwise documented below in the visit note.  Chief Complaint  Patient presents with  . Follow-up    HPI: Nichole Smith 61 y.o.   Fu bp and rx last week for complicated sinusitis  She states she feels much better after just 1-2 days on the Augmentin. Has 1 more day left. Still has a minor cough.  Blood pressure readings she brings in on 50 mg of Toprol. These readings are range from 117/66-135/75. Pulse is usually in the 60s one time it was 58 at Dr. Thompson Caul office. Generally she feels less tired with her pulse rate being better. Would like a refill change to a 50 mg tablet.  She's been released for routine follow-up with her chest surgeon but recommended to have a yearly chest x-ray which she gets in July. For hx carcinoid tumor of lung   She has seen the lipid clinic and cardiology and is not on atorvastatin as a high level of lipoprotein a unfavorable working on getting her lipids down trying to exercise however her Achilles heel issues are in the way however she is trying to cycle and continue healthy lifestyle. ROS: See pertinent positives and negatives per HPI. No cp sob syncope feer see above   Past Medical History:  Diagnosis Date  . Arthritis    knees, back  . Carcinoid tumor of lung 2012   right found incidentally on chest xray eval for r/o vasxculitis   . Coronary artery calcification 07/23/2016  . Double vision    history, no current problem  . Dysrhythmia    irregular occassional, no current problem  . Endometrial cells on cervical Pap smear inconsistent w/LMP 6/11   benign proliferative endometrium on endometrial biopsy  . HNP (herniated nucleus pulposus), cervical 01/22/2011   C6-C7  Has seen dr Vertell Limber for this and Dr Tonita Cong  Positional numbness in hands no weakness    . Hypertension   . Hypothyroidism   . 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  Obstructive sleep apnea 07/27/2014   NPSG 07/2014:  AHI 85/hr, optimal cpap 10cm.  Download 08/2014:  Good compliance, breakthru apnea on 10cm.  Changed to auto 5-12cm >> good control of AHI on f/u download ONO on CPAP:      . Sleep apnea    uses CPAP nightly  . SUI (stress urinary incontinence, female)    h/o    Family History  Problem Relation Age of Onset  . Stroke Mother     died age 2  . Hypertension Mother   . Coronary artery disease Father     died age 76  . Hypertension Father   . Parkinson's disease Brother   . Diabetes Brother   . Hypertension Maternal Uncle   . Hypertension Brother     x2  . Colon cancer Neg Hx   . Rectal cancer Neg Hx   . Stomach cancer Neg Hx     Social History   Social History  . Marital status: Married    Spouse name: N/A  . Number of children: 3  . Years of education: N/A   Occupational History  . retied [employer]   Social History Main Topics  . Smoking status: Never Smoker  . Smokeless tobacco: Never Used  . Alcohol use 1.2 oz/week    2 Glasses of wine per week  . Drug use: No  . Sexual activity:  Yes    Partners: Male    Birth control/ protection: None, Post-menopausal   Other Topics Concern  . None   Social History Narrative   hhof 2    2 Children at college and  beyond      [name] full time   Married    Retired age 9 2015    Outpatient Medications Prior to Visit  Medication Sig Dispense Refill  . amLODipine (NORVASC) 5 MG tablet TAKE 1 TABLET DAILY 90 tablet 2  . amoxicillin-clavulanate (AUGMENTIN) 875-125 MG tablet Take 1 tablet by mouth every 12 (twelve) hours. 14 tablet 0  . aspirin 81 MG tablet Take 81 mg by mouth daily.    [name] atorvastatin (LIPITOR) 40 MG tablet Take 1 tablet (40 mg total) by mouth daily. 90 tablet 3  . CALCIUM PO Take by mouth 2 (two) times daily.    . Cholecalciferol (VITAMIN D) 2000 UNITS CAPS Take by mouth.    . hydrochlorothiazide (MICROZIDE) 12.5 MG capsule  Take 1 capsule (12.5 mg total) by mouth daily. 90 capsule 0  . HYDROcodone-homatropine (HYCODAN) 5-1.5 MG/5ML syrup Take 5 mLs by mouth every 4 (four) hours as needed for cough. At night 120 mL 0  . methocarbamol (ROBAXIN) 750 MG tablet 1-2 po tid  PO for muscle spasm 60 tablet 1  . Multiple Vitamins-Minerals (EYE VITAMINS PO) Take 1 capsule by mouth 2 (two) times daily. "Eye caps"    . Multiple Vitamins-Minerals (MULTIVITAMIN PO) Take 1 tablet by mouth daily.     . nitroGLYCERIN (NITRODUR - DOSED IN MG/24 HR) 0.2 mg/hr patch 1/4 patch daily 30 patch 1  . Omega-3 Fatty Acids (FISH OIL PO) Take 1 capsule by mouth daily.     [name] triamcinolone ointment (KENALOG) 0.5 % Apply 1 application topically 2 (two) times daily. Can use for up to 7 days with flare. 60 g 3  . Vitamin D, Ergocalciferol, (DRISDOL) 50000 units CAPS capsule Take 1 capsule (50,000 Units total) by mouth every 7 (seven) days. 12 capsule 0  . metoprolol succinate (TOPROL-XL) 100 MG 24 hr tablet Take 50 mg by mouth daily. Take with or immediately following a meal. PT takes 50 mg daily     . SYNTHROID 137 MCG tablet TAKE 1 TABLET DAILY 90 tablet 1   No facility-administered medications prior to visit.      EXAM:  BP (!) 146/84 (BP Location: Right Arm, Patient Position: Sitting, Cuff Size: Normal)   Temp 98.2 F (36.8 C) (Oral)   Wt 208 lb (94.3 kg)   LMP 02/12/2011   BMI 40.62 kg/m   Body mass index is 40.62 kg/m.  GENERAL: vitals reviewed and listed above, alert, oriented, appears well hydrated and in no acute distress mildly congested periorbital edema seems to be gone. HEENT: atraumatic, conjunctiva  clear, no obvious abnormalities on inspection of external nose and ears OP : no lesion edema or exudate  NECK: no obvious masses on inspection palpation  LUNGS: clear to auscultation bilaterally, no wheezes, rales or rhonchi, good air movement CV: HRRR, no clubbing cyanosis or  peripheral edema nl cap refill  Abdomen soft  without organomegaly guarding or rebound MS: moves all extremities without noticeable focal  abnormality PSYCH: pleasant and cooperative, no obvious depression or anxiety Lab Results  Component Value Date   WBC 7.5 09/08/2016   HGB 15.1 09/08/2016   HCT 44.6 09/08/2016   PLT 252 09/08/2016   GLUCOSE 95 03/05/2016   CHOL 110 11/03/2016  TRIG 54 11/03/2016   HDL 48 (L) 11/03/2016   LDLCALC 49 11/03/2016   ALT 31 (H) 11/03/2016   AST 23 09/08/2016   NA 140 03/05/2016   K 4.1 03/05/2016   CL 101 03/05/2016   CREATININE 0.71 03/05/2016   BUN 25 (H) 03/05/2016   CO2 32 03/05/2016   TSH 2.31 03/05/2016   INR 0.96 06/11/2011   BP Readings from Last 3 Encounters:  12/09/16 (!) 146/84  12/04/16 (!) 146/82  11/26/16 124/82   Wt Readings from Last 3 Encounters:  12/09/16 208 lb (94.3 kg)  12/04/16 210 lb (95.3 kg)  11/26/16 218 lb (98.9 kg)     ASSESSMENT AND PLAN:  Discussed the following assessment and plan:  Essential hypertension - Continue monitoring current regimen. 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 - Plan: DG Chest 2 View  Morbid obesity, unspecified obesity type ([place]) - cont weight loss  -Patient advised to return or notify health care team  if symptoms worsen ,persist or new concerns arise.  Patient Instructions  Stay on metop 50 mg  As planmned and will send this in to pharmacy .   Due for full panel of labs in April and cpx .   Can get cxray when due   Will put in  Standing  Order .  End of July  Due for yearly chest x ray .   Exercise as much possible .    Standley Brooking. Panosh M.D.

## 2016-12-09 ENCOUNTER — Encounter: Payer: Self-pay | Admitting: Internal Medicine

## 2016-12-09 ENCOUNTER — Ambulatory Visit (INDEPENDENT_AMBULATORY_CARE_PROVIDER_SITE_OTHER): Admitting: Internal Medicine

## 2016-12-09 VITALS — BP 146/84 | Temp 98.2°F | Wt 208.0 lb

## 2016-12-09 DIAGNOSIS — Z79899 Other long term (current) drug therapy: Secondary | ICD-10-CM | POA: Diagnosis not present

## 2016-12-09 DIAGNOSIS — D3A090 Benign carcinoid tumor of the bronchus and lung: Secondary | ICD-10-CM

## 2016-12-09 DIAGNOSIS — E785 Hyperlipidemia, unspecified: Secondary | ICD-10-CM | POA: Diagnosis not present

## 2016-12-09 DIAGNOSIS — J019 Acute sinusitis, unspecified: Secondary | ICD-10-CM

## 2016-12-09 DIAGNOSIS — I7090 Unspecified atherosclerosis: Secondary | ICD-10-CM | POA: Diagnosis not present

## 2016-12-09 DIAGNOSIS — I10 Essential (primary) hypertension: Secondary | ICD-10-CM

## 2016-12-09 MED ORDER — METOPROLOL SUCCINATE ER 50 MG PO TB24: 50.0000 mg | ORAL_TABLET | Freq: Every day | ORAL | 3 refills | Status: DC

## 2016-12-09 MED ORDER — SYNTHROID 137 MCG PO TABS: 137.0000 ug | ORAL_TABLET | Freq: Every day | ORAL | 1 refills | Status: DC

## 2016-12-09 NOTE — Patient Instructions (Addendum)
Stay on metop 50 mg  As planmned and will send this in to pharmacy .   Due for full panel of labs in April and cpx .   Can get cxray when due   Will put in  Standing  Order .  End of July  Due for yearly chest x ray .   Exercise as much possible .

## 2016-12-12 ENCOUNTER — Encounter: Payer: Self-pay | Admitting: Internal Medicine

## 2016-12-12 MED ORDER — AMOXICILLIN-POT CLAVULANATE 875-125 MG PO TABS: 1.0000 | ORAL_TABLET | Freq: Two times a day (BID) | ORAL | 0 refills | Status: DC

## 2016-12-23 NOTE — Progress Notes (Signed)
[facility] [place], [place] [address] Phone: [phone] Subjective:    CC:  Left ankle pain f/u  WCH:[id number]  Nichole Smith is a 61 y.o. female coming in with complaint of left ankle pain. We have seen patient previously for an Achilles tendinosis. Patient was seen recently and did have more of an Achilles tear. She was to continue the natural history patches. Patient was having some mild skin irritation so has discontinued it. Was having calcific changes we started on once weekly vitamin D. Patient states that 2 weeks ago did have another flare but since that is feeling significantly better. Patient states that it is very minimal tenderness at this time. Has started walking on a more regular basis already with no increase in pain.    Past Medical History:  Diagnosis Date  . Arthritis    knees, back  . Carcinoid tumor of lung 2012   right found incidentally on chest xray eval for r/o vasxculitis   . Coronary artery calcification 07/23/2016  . Double vision    history, no current problem  . Dysrhythmia    irregular occassional, no current problem  . Endometrial cells on cervical Pap smear inconsistent w/LMP 6/11   benign proliferative endometrium on endometrial biopsy  . HNP (herniated nucleus pulposus), cervical 01/22/2011   C6-C7  Has seen dr Vertell Limber for this and Dr Tonita Cong  Positional numbness in hands no weakness    . Hypertension   . Hypothyroidism   . 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 Obstructive sleep apnea 07/27/2014   NPSG 07/2014:  AHI 85/hr, optimal cpap 10cm.  Download 08/2014:  Good compliance, breakthru apnea on 10cm.  Changed to auto 5-12cm >> good control of AHI on f/u download ONO on CPAP:      . Sleep apnea    uses CPAP nightly  . SUI (stress urinary incontinence, female)    h/o   Past Surgical History:  Procedure Laterality Date  . CESAREAN SECTION     x 1 - twins  . COLONOSCOPY  07/31/05   brodie -  polyps and bx's  . DILATATION & CURRETTAGE/HYSTEROSCOPY WITH RESECTOCOPE N/A 07/18/2013   Procedure: DILATATION & CURETTAGE/HYSTEROSCOPY WITH RESECTOCOPE;  Surgeon: Lyman Speller, MD;  Location: [facility];  Service: Gynecology;  Laterality: N/A;  mass resection  . Lung tumor removed     Rt lung carcinoid  . SHOULDER OPEN ROTATOR CUFF REPAIR  07/20/2010   Right  . WISDOM TOOTH EXTRACTION     Social History   Social History  . Marital status: Married    Spouse name: N/A  . Number of children: 3  . Years of education: N/A   Occupational History  . retied [employer]   Social History Main Topics  . Smoking status: Never Smoker  . Smokeless tobacco: Never Used  . Alcohol use 1.2 oz/week    2 Glasses of wine per week  . Drug use: No  . Sexual activity: Yes    Partners: Male    Birth control/ protection: None, Post-menopausal   Other Topics Concern  . None   Social History Narrative   hhof 2    2 Children at college and  beyond      [name] full time   Married    Retired age 61 2015   Allergies  Allergen Reactions  . Neomycin     REACTION: Inflammation  . Ace Inhibitors Cough  Family History  Problem Relation Age of Onset  . Stroke Mother     died age 72  . Hypertension Mother   . Coronary artery disease Father     died age 37  . Hypertension Father   . Parkinson's disease Brother   . Diabetes Brother   . Hypertension Maternal Uncle   . Hypertension Brother     x2  . Colon cancer Neg Hx   . Rectal cancer Neg Hx   . Stomach cancer Neg Hx     Past medical history, social, surgical and family history all reviewed in electronic medical record.  No pertanent information unless stated regarding to the chief complaint.   Review of Systems: No headache, visual changes, nausea, vomiting, diarrhea, constipation, dizziness, abdominal pain, skin rash, fevers, chills, night sweats, weight loss, swollen lymph nodes, body aches, joint  swelling, muscle aches, chest pain, shortness of breath, mood changes.       Objective  Blood pressure 118/82, pulse 63, height 5' (1.524 m), weight 214 lb (97.1 kg), last menstrual period 02/12/2011, SpO2 98 %.   Systems examined below as of 12/24/16 General: NAD A&O x3 mood, affect normal obese.  HEENT: Pupils equal, extraocular movements intact no nystagmus Respiratory: not short of breath at rest or with speaking Cardiovascular: No lower extremity edema, non tender Skin: Warm dry intact with no signs of infection or rash on extremities or on axial skeleton. Abdomen: Soft nontender, no masses Neuro: Cranial nerves  intact, neurovascularly intact in all extremities with 2+ DTRs and 2+ pulses. Lymph: No lymphadenopathy appreciated today  Gait normal with good balance and coordination. No difficulty with walking. MSK: Non tender with full range of motion and good stability and symmetric strength and tone of shoulders, elbows, wrist,  knee hips bilaterally.    Ankle: Left No swelling noted Full range of motion at this time. Minimal tenderness at the insertion of the Achilles Negative tarsal tunnel tinel's Contralateral ankle unremarkable      Impression and Recommendations:     This case required medical decision making of moderate complexity.      Note: This dictation was prepared with Dragon dictation along with smaller phrase technology. Any transcriptional errors that result from this process are unintentional.

## 2016-12-24 ENCOUNTER — Ambulatory Visit (INDEPENDENT_AMBULATORY_CARE_PROVIDER_SITE_OTHER): Admitting: Family Medicine

## 2016-12-24 ENCOUNTER — Encounter: Payer: Self-pay | Admitting: Family Medicine

## 2016-12-24 DIAGNOSIS — S86012D Strain of left Achilles tendon, subsequent encounter: Secondary | ICD-10-CM | POA: Diagnosis not present

## 2016-12-24 NOTE — Patient Instructions (Signed)
Good see yo u Continue the exercises 2-3 times a week  You will do great for your trip Start to increase walking again.  Continue the vitamin D then when out go to 2000 IU dialy  Pennsaid only when needed.  See me again in 2 months and lets get you all ready for the trip

## 2016-12-24 NOTE — Assessment & Plan Note (Signed)
Doing much better overall. Advance patient's activity accordingly. Patient given a brace so she does not need to wear the Cam Walker. I would like to see her before her trip that that time we will give her medications in case she does have any type of flare. Any worsening symptoms to come back sooner. Encourage her to continue the vitamin D supplementation. Follow-up again in 1-2 months.

## 2016-12-28 ENCOUNTER — Other Ambulatory Visit: Payer: Self-pay | Admitting: Internal Medicine

## 2017-01-01 NOTE — Telephone Encounter (Signed)
Sent to the pharmacy by e-scribe for 6 months.  Pt has cpx on 04/21/17.

## 2017-01-02 ENCOUNTER — Telehealth: Payer: Self-pay | Admitting: Obstetrics & Gynecology

## 2017-01-02 NOTE — Telephone Encounter (Signed)
Left message regarding upcoming appointment has been canceled and needs to be rescheduled. °

## 2017-01-06 ENCOUNTER — Ambulatory Visit (INDEPENDENT_AMBULATORY_CARE_PROVIDER_SITE_OTHER): Admitting: Family Medicine

## 2017-01-06 ENCOUNTER — Encounter: Payer: Self-pay | Admitting: Family Medicine

## 2017-01-06 VITALS — BP 124/80 | HR 62 | Ht 60.0 in | Wt 214.0 lb

## 2017-01-06 DIAGNOSIS — M722 Plantar fascial fibromatosis: Secondary | ICD-10-CM | POA: Diagnosis not present

## 2017-01-06 DIAGNOSIS — M216X2 Other acquired deformities of left foot: Secondary | ICD-10-CM

## 2017-01-06 DIAGNOSIS — M2041 Other hammer toe(s) (acquired), right foot: Secondary | ICD-10-CM

## 2017-01-06 NOTE — Assessment & Plan Note (Addendum)
Problem. Patient has breakdown of transverse arch. This is likely contributing to some of the discomfort. Patient has been doing a lot of heel lifts to try to help her Achilles. States that she had been doing very well and doing increasing number of repetitions. Patient likely will do well. We made adjustments to the orthotics. Will unload with increasing the transverse arch. We discussed icing regimen. Patient come back and see me again in 3 weeks.

## 2017-01-06 NOTE — Assessment & Plan Note (Signed)
Stable changes.

## 2017-01-06 NOTE — Progress Notes (Signed)
[name] [address], [place] [address] Phone: [phone] Subjective:     CC: Right toe pain  SHF:[id number]  Nichole Smith is a 61 y.o. female coming in with complaint of right toe pain. Patient rates that this is a new problem. 2nd toe on the right.  Mild swelling. No radiation, affecting any waling.  Pain with weight bearing. Patient hasn't wearing the custom orthotics. Avoiding being barefoot most of time except for when doing exercises. Patient states that the pain is severe. Not responding as much to ibuprofen regularly..      Past Medical History:  Diagnosis Date  . Arthritis    knees, back  . Carcinoid tumor of lung 2012   right found incidentally on chest xray eval for r/o vasxculitis   . Coronary artery calcification 07/23/2016  . Double vision    history, no current problem  . Dysrhythmia    irregular occassional, no current problem  . Endometrial cells on cervical Pap smear inconsistent w/LMP 6/11   benign proliferative endometrium on endometrial biopsy  . HNP (herniated nucleus pulposus), cervical 01/22/2011   C6-C7  Has seen dr Vertell Limber for this and Dr Tonita Cong  Positional numbness in hands no weakness    . Hypertension   . Hypothyroidism   . 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 Obstructive sleep apnea 07/27/2014   NPSG 07/2014:  AHI 85/hr, optimal cpap 10cm.  Download 08/2014:  Good compliance, breakthru apnea on 10cm.  Changed to auto 5-12cm >> good control of AHI on f/u download ONO on CPAP:      . Sleep apnea    uses CPAP nightly  . SUI (stress urinary incontinence, female)    h/o   Past Surgical History:  Procedure Laterality Date  . CESAREAN SECTION     x 1 - twins  . COLONOSCOPY  07/31/05   brodie - polyps and bx's  . DILATATION & CURRETTAGE/HYSTEROSCOPY WITH RESECTOCOPE N/A 07/18/2013   Procedure: DILATATION & CURETTAGE/HYSTEROSCOPY WITH RESECTOCOPE;  Surgeon: Lyman Speller, MD;  Location: [facility];   Service: Gynecology;  Laterality: N/A;  mass resection  . Lung tumor removed     Rt lung carcinoid  . SHOULDER OPEN ROTATOR CUFF REPAIR  07/20/2010   Right  . WISDOM TOOTH EXTRACTION     Social History   Social History  . Marital status: Married    Spouse name: N/A  . Number of children: 3  . Years of education: N/A   Occupational History  . retied [employer]   Social History Main Topics  . Smoking status: Never Smoker  . Smokeless tobacco: Never Used  . Alcohol use 1.2 oz/week    2 Glasses of wine per week  . Drug use: No  . Sexual activity: Yes    Partners: Male    Birth control/ protection: None, Post-menopausal   Other Topics Concern  . None   Social History Narrative   hhof 2    2 Children at college and  beyond      [name] full time   Married    Retired age 33 2015   Allergies  Allergen Reactions  . Neomycin     REACTION: Inflammation  . Ace Inhibitors Cough   Family History  Problem Relation Age of Onset  . Stroke Mother     died age 52  . Hypertension Mother   . Coronary artery disease Father     died  age 56  . Hypertension Father   . Parkinson's disease Brother   . Diabetes Brother   . Hypertension Maternal Uncle   . Hypertension Brother     x2  . Colon cancer Neg Hx   . Rectal cancer Neg Hx   . Stomach cancer Neg Hx     Past medical history, social, surgical and family history all reviewed in electronic medical record.  No pertanent information unless stated regarding to the chief complaint.   Review of Systems: No headache, visual changes, nausea, vomiting, diarrhea, constipation, dizziness, abdominal pain, skin rash, fevers, chills, night sweats, weight loss, swollen lymph nodes, chest pain, shortness of breath, mood changes.    Objective  Blood pressure 124/80, pulse 62, height 5' (1.524 m), weight 214 lb (97.1 kg), last menstrual period 02/12/2011, SpO2 98 %. Systems examined below as of 01/06/17     General: No apparent distress alert and oriented x3 mood and affect normal, dressed appropriately.  HEENT: Pupils equal, extraocular movements intact  Respiratory: Patient's speak in full sentences and does not appear short of breath  Cardiovascular: No lower extremity edema, non tender, no erythema  Skin: Warm dry intact with no signs of infection or rash on extremities or on axial skeleton.  Abdomen: Soft nontender  Neuro: Cranial nerves II through XII are intact, neurovascularly intact in all extremities with 2+ DTRs and 2+ pulses.  Lymph: No lymphadenopathy of posterior or anterior cervical chain or axillae bilaterally.  Gait normal with good balance and coordination.  MSK:  Non tender with full range of motion and good stability and symmetric strength and tone of shoulders, elbows, wrist, hip, knee and ankles bilaterally.  Foot exam shows the patient does have some pes planus bilaterally with oversupination. Patient does have tenderness over the right second toe. Mild subluxation and hammering noted. Mild splaying between the first and second toes. Tenderness over the second toe noted.    Impression and Recommendations:     This case required medical decision making of moderate complexity.      Note: This dictation was prepared with Dragon dictation along with smaller phrase technology. Any transcriptional errors that result from this process are unintentional.

## 2017-01-06 NOTE — Patient Instructions (Signed)
Good to see you Nichole Smith is your friend.  Made changes to  Orthotic today  I think this will help Can take pennsaid 2 times a day if needed.  See me again in 2-3 weeks if not better

## 2017-01-23 ENCOUNTER — Other Ambulatory Visit: Payer: Self-pay | Admitting: Internal Medicine

## 2017-01-26 NOTE — Telephone Encounter (Signed)
Sent to the pharmacy by e-scribe for 90 days.  Pt has yearly scheduled for 04/21/17

## 2017-01-26 NOTE — Progress Notes (Signed)
[name] [address] [place], [place] [address] Phone: [phone] Subjective:     CC: Right toe pain  [id number]  Nichole Smith is a 61 y.o. female coming in with complaint of right toe pain. Patient Was also found to have more of an Achilles tear. Patient had been doing well with conservative therapy. Patient was having more toe pain that seemed to be secondary to the custom orthotics we did make changes. Patient was to follow-up. Patient states No significant improvement. Seems to be very localized to the base of the second toe on the plantar aspect. Does not remember any true injury. Worsening symptoms. Affecting walking on a regular basis.      Past Medical History:  Diagnosis Date  . Arthritis    knees, back  . Carcinoid tumor of lung 2012   right found incidentally on chest xray eval for r/o vasxculitis   . Coronary artery calcification 07/23/2016  . Double vision    history, no current problem  . Dysrhythmia    irregular occassional, no current problem  . Endometrial cells on cervical Pap smear inconsistent w/LMP 6/11   benign proliferative endometrium on endometrial biopsy  . HNP (herniated nucleus pulposus), cervical 01/22/2011   C6-C7  Has seen dr Vertell Limber for this and Dr Tonita Cong  Positional numbness in hands no weakness    . Hypertension   . Hypothyroidism   . 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 Obstructive sleep apnea 07/27/2014   NPSG 07/2014:  AHI 85/hr, optimal cpap 10cm.  Download 08/2014:  Good compliance, breakthru apnea on 10cm.  Changed to auto 5-12cm >> good control of AHI on f/u download ONO on CPAP:      . Sleep apnea    uses CPAP nightly  . SUI (stress urinary incontinence, female)    h/o   Past Surgical History:  Procedure Laterality Date  . CESAREAN SECTION     x 1 - twins  . COLONOSCOPY  07/31/05   brodie - polyps and bx's  . DILATATION & CURRETTAGE/HYSTEROSCOPY WITH RESECTOCOPE N/A 07/18/2013   Procedure: DILATATION & CURETTAGE/HYSTEROSCOPY WITH RESECTOCOPE;  Surgeon: Lyman Speller, MD;  Location: [facility];  Service: Gynecology;  Laterality: N/A;  mass resection  . Lung tumor removed     Rt lung carcinoid  . SHOULDER OPEN ROTATOR CUFF REPAIR  07/20/2010   Right  . WISDOM TOOTH EXTRACTION     Social History   Social History  . Marital status: Married    Spouse name: N/A  . Number of children: 3  . Years of education: N/A   Occupational History  . retied [employer]   Social History Main Topics  . Smoking status: Never Smoker  . Smokeless tobacco: Never Used  . Alcohol use 1.2 oz/week    2 Glasses of wine per week  . Drug use: No  . Sexual activity: Yes    Partners: Male    Birth control/ protection: None, Post-menopausal   Other Topics Concern  . None   Social History Narrative   hhof 2    2 Children at college and  beyond      [name] full time   Married    Retired age 79 2015   Allergies  Allergen Reactions  . Neomycin     REACTION: Inflammation  . Ace Inhibitors Cough   Family History  Problem Relation Age of Onset  . Stroke Mother  died age 11  . Hypertension Mother   . Coronary artery disease Father     died age 46  . Hypertension Father   . Parkinson's disease Brother   . Diabetes Brother   . Hypertension Maternal Uncle   . Hypertension Brother     x2  . Colon cancer Neg Hx   . Rectal cancer Neg Hx   . Stomach cancer Neg Hx     Past medical history, social, surgical and family history all reviewed in electronic medical record.  No pertanent information unless stated regarding to the chief complaint.   Review of Systems: No headache, visual changes, nausea, vomiting, diarrhea, constipation, dizziness, abdominal pain, skin rash, fevers, chills, night sweats, weight loss, swollen lymph nodes, body aches, joint swelling, muscle aches, chest pain, shortness of breath, mood changes.     Objective  Blood  pressure 128/78, pulse 68, height 5' (1.524 m), weight 214 lb (97.1 kg), last menstrual period 02/12/2011. Systems examined below as of 01/27/17   General: No apparent distress alert and oriented x3 mood and affect normal, dressed appropriately.  HEENT: Pupils equal, extraocular movements intact  Respiratory: Patient's speak in full sentences and does not appear short of breath  Cardiovascular: No lower extremity edema, non tender, no erythema  Skin: Warm dry intact with no signs of infection or rash on extremities or on axial skeleton.  Abdomen: Soft nontender  Neuro: Cranial nerves II through XII are intact, neurovascularly intact in all extremities with 2+ DTRs and 2+ pulses.  Lymph: No lymphadenopathy of posterior or anterior cervical chain or axillae bilaterally.  Gait antalgic gait MSK:  Non tender with full range of motion and good stability and symmetric strength and tone of shoulders, elbows, wrist, hip, knee and ankles bilaterally. Mild arthritic changes Foot exam shows the patient does have severe tenderness now over the second toe. Continues to still have breakdown that was seen previously.  Limited musculoskeletal ultrasound was performed and interpreted by Lyndal Pulley  Limited ultrasound shows the patient does have what appears to be soft tissue collection of bone of the head of the second metatarsal on the plantar aspect. Possible foreign body also noted. Disorder patient is severely tender. Impression: Possible abscess first possible callus formation at the metatarsophalangeal joint.  Procedure: Real-time Ultrasound Guided Injection of right second toe plantar aspect Device: GE Logiq Q7 Ultrasound guided injection is preferred based studies that show increased duration, increased effect, greater accuracy, decreased procedural pain, increased response rate, and decreased cost with ultrasound guided versus blind injection.  Verbal informed consent obtained.  Time-out  conducted.  Noted no overlying erythema, induration, or other signs of local infection.  Skin prepped in a sterile fashion.  Local anesthesia: Topical Ethyl chloride.  With sterile technique and under real time ultrasound guidance:  With a 25-gauge half-inch needle patient was injected with a total of 0.5 mL of 0.5% Marcaine and 0.5 mL Kenalog in the plantar aspect of the second toe. Completed without difficulty  Pain immediately resolved suggesting accurate placement of the medication.  Advised to call if fevers/chills, erythema, induration, drainage, or persistent bleeding.  Images permanently stored and available for review in the ultrasound unit.  Impression: Technically successful ultrasound guided injection.   Impression and Recommendations:     This case required medical decision making of moderate complexity.      Note: This dictation was prepared with Dragon dictation along with smaller phrase technology. Any transcriptional errors that result from this process are  unintentional.

## 2017-01-27 ENCOUNTER — Ambulatory Visit: Payer: Self-pay

## 2017-01-27 ENCOUNTER — Ambulatory Visit (INDEPENDENT_AMBULATORY_CARE_PROVIDER_SITE_OTHER): Admitting: Family Medicine

## 2017-01-27 ENCOUNTER — Encounter: Payer: Self-pay | Admitting: Family Medicine

## 2017-01-27 VITALS — BP 128/78 | HR 68 | Ht 60.0 in | Wt 214.0 lb

## 2017-01-27 DIAGNOSIS — M79671 Pain in right foot: Secondary | ICD-10-CM

## 2017-01-27 DIAGNOSIS — M71571 Other bursitis, not elsewhere classified, right ankle and foot: Secondary | ICD-10-CM

## 2017-01-27 DIAGNOSIS — M7751 Other enthesopathy of right foot: Secondary | ICD-10-CM | POA: Insufficient documentation

## 2017-01-27 MED ORDER — DOXYCYCLINE HYCLATE 100 MG PO TABS: 100.0000 mg | ORAL_TABLET | Freq: Two times a day (BID) | ORAL | 0 refills | Status: AC

## 2017-01-27 NOTE — Patient Instructions (Addendum)
Great to see you as always.  Stay active.  We did do an injection  I think you will do well.  Also doxycycline 10 days can help  See me again in 3 weeks right before your trip if not great

## 2017-01-27 NOTE — Assessment & Plan Note (Signed)
Patient had what appeared to be more of a possible calcific bursitis first possible accumulation or foreign body. Patient is going to be put on antibiotics in case that there is a early abscess formation even though there is no erythema of the skin. We discussed icing regimen, home exercise, which activities doing which was to avoid. Patient will continue to wear the custom orthotics. We discussed which activities to do a which was to avoid. Patient is going out of town in the next month and will be traveling [place]. Hopefully patient will be doing well with conservative therapy. Follow-up again in 3 weeks

## 2017-02-16 NOTE — Progress Notes (Signed)
[name] [address], [place] [address] Phone: [phone] Subjective:     CC: Right toe pain f/u   [id number]  Nichole Smith is a 61 y.o. female coming in with complaint of right toe pain. Patient Was also found to have more of an Achilles tear. Patient was having more pain on the left side. Patient was having calcific changes and started on once weekly vitamin D. Patient did have the adjustments made to the orthotics and feels that the toe is doing significantly better. Patient states The toe is feeling significantly better. Unfortunately is having worsening ankle pain again on the left sign. Seemed to come out and work, had some redness and swelling. Seem to resolve after 2 days but still has some tightness of the posterior heel. Patient has done physical therapy and has had a massage. Still has discomfort. Started wearing the brace more regularly now.      Past Medical History:  Diagnosis Date  . Arthritis    knees, back  . Carcinoid tumor of lung 2012   right found incidentally on chest xray eval for r/o vasxculitis   . Coronary artery calcification 07/23/2016  . Double vision    history, no current problem  . Dysrhythmia    irregular occassional, no current problem  . Endometrial cells on cervical Pap smear inconsistent w/LMP 6/11   benign proliferative endometrium on endometrial biopsy  . HNP (herniated nucleus pulposus), cervical 01/22/2011   C6-C7  Has seen dr Vertell Limber for this and Dr Tonita Cong  Positional numbness in hands no weakness    . Hypertension   . Hypothyroidism   . 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 Obstructive sleep apnea 07/27/2014   NPSG 07/2014:  AHI 85/hr, optimal cpap 10cm.  Download 08/2014:  Good compliance, breakthru apnea on 10cm.  Changed to auto 5-12cm >> good control of AHI on f/u download ONO on CPAP:      . Sleep apnea    uses CPAP nightly  . SUI (stress urinary incontinence, female)    h/o    Past Surgical History:  Procedure Laterality Date  . CESAREAN SECTION     x 1 - twins  . COLONOSCOPY  07/31/05   brodie - polyps and bx's  . DILATATION & CURRETTAGE/HYSTEROSCOPY WITH RESECTOCOPE N/A 07/18/2013   Procedure: DILATATION & CURETTAGE/HYSTEROSCOPY WITH RESECTOCOPE;  Surgeon: Lyman Speller, MD;  Location: [facility];  Service: Gynecology;  Laterality: N/A;  mass resection  . Lung tumor removed     Rt lung carcinoid  . SHOULDER OPEN ROTATOR CUFF REPAIR  07/20/2010   Right  . WISDOM TOOTH EXTRACTION     Social History   Social History  . Marital status: Married    Spouse name: N/A  . Number of children: 3  . Years of education: N/A   Occupational History  . retied [employer]   Social History Main Topics  . Smoking status: Never Smoker  . Smokeless tobacco: Never Used  . Alcohol use 1.2 oz/week    2 Glasses of wine per week  . Drug use: No  . Sexual activity: Yes    Partners: Male    Birth control/ protection: None, Post-menopausal   Other Topics Concern  . None   Social History Narrative   hhof 2    2 Children at college and  beyond      [employer] full time   Married    Retired  age 79 31   Allergies  Allergen Reactions  . Neomycin     REACTION: Inflammation  . Ace Inhibitors Cough   Family History  Problem Relation Age of Onset  . Stroke Mother     died age 20  . Hypertension Mother   . Coronary artery disease Father     died age 20  . Hypertension Father   . Parkinson's disease Brother   . Diabetes Brother   . Hypertension Maternal Uncle   . Hypertension Brother     x2  . Colon cancer Neg Hx   . Rectal cancer Neg Hx   . Stomach cancer Neg Hx     Past medical history, social, surgical and family history all reviewed in electronic medical record.  No pertanent information unless stated regarding to the chief complaint.   Review of Systems: No headache, visual changes, nausea, vomiting, diarrhea,  constipation, dizziness, abdominal pain, skin rash, fevers, chills, night sweats, weight loss, swollen lymph nodes, body aches, joint swelling, muscle aches, chest pain, shortness of breath, mood changes.    Objective  Blood pressure 124/86, pulse 65, height 5' (1.524 m), weight 217 lb 9.6 oz (98.7 kg), last menstrual period 02/12/2011, SpO2 97 %.   Systems examined below as of 02/17/17 General: NAD A&O x3 mood, affect normal  HEENT: Pupils equal, extraocular movements intact no nystagmus Respiratory: not short of breath at rest or with speaking Cardiovascular: No lower extremity edema, non tender Skin: Warm dry intact with no signs of infection or rash on extremities or on axial skeleton. Abdomen: Soft nontender, no masses Neuro: Cranial nerves  intact, neurovascularly intact in all extremities with 2+ DTRs and 2+ pulses. Lymph: No lymphadenopathy appreciated today    Gait antalgic gait MSK:  Non tender with full range of motion and good stability and symmetric strength and tone of shoulders, elbows, wrist, hip, knee bilaterally. Mild arthritic changes Foot exam Break down of the transverse arch noted no tenderness over the second toe like last exam.  Left ankle did show some tightness of the posterior Court. Mild tenderness over the Achilles itself.   Impression and Recommendations:     This case required medical decision making of moderate complexity.      Note: This dictation was prepared with Dragon dictation along with smaller phrase technology. Any transcriptional errors that result from this process are unintentional.

## 2017-02-17 ENCOUNTER — Ambulatory Visit (INDEPENDENT_AMBULATORY_CARE_PROVIDER_SITE_OTHER): Admitting: Family Medicine

## 2017-02-17 ENCOUNTER — Encounter: Payer: Self-pay | Admitting: Family Medicine

## 2017-02-17 DIAGNOSIS — M7662 Achilles tendinitis, left leg: Secondary | ICD-10-CM | POA: Diagnosis not present

## 2017-02-17 MED ORDER — ALLOPURINOL 100 MG PO TABS: 200.0000 mg | ORAL_TABLET | Freq: Every day | ORAL | 6 refills | Status: DC

## 2017-02-17 NOTE — Assessment & Plan Note (Signed)
Patient was having pain as well as swelling. No increase in activity. With patient having intermittent swelling and pain of the posterior cords bilaterally possible gout. Patient does have a family history of this. We discussed with patient about icing and home exercises. We discussed which activities to do in which ones to avoid. Patient will start increasing activity slowly over the course of time. Will wear the brace. We'll see how patient response to conservative therapy. Follow-up again in 3 weeks before patient goes out of the country.

## 2017-02-17 NOTE — Patient Instructions (Signed)
Good to e you  DO not lace the FIRST eye on the shoe Ice is your friend still  Lets try allopurinol daily for a while and see if that helps Watch diet a little bit and see if when you have red meat, seafood or alcohol the pain gets worse Try to wear the brace with the air cushion on the heel.  Send me a message in 1 week and tell me how you are doing.

## 2017-03-09 ENCOUNTER — Ambulatory Visit (INDEPENDENT_AMBULATORY_CARE_PROVIDER_SITE_OTHER): Admitting: Obstetrics & Gynecology

## 2017-03-09 ENCOUNTER — Encounter: Payer: Self-pay | Admitting: Obstetrics & Gynecology

## 2017-03-09 ENCOUNTER — Other Ambulatory Visit (HOSPITAL_COMMUNITY)
Admission: RE | Admit: 2017-03-09 | Discharge: 2017-03-09 | Disposition: A | Discharge status: Home / Self Care | Source: Ambulatory Visit | Attending: Obstetrics & Gynecology | Admitting: Obstetrics & Gynecology

## 2017-03-09 VITALS — BP 118/70 | HR 76 | Resp 16 | Ht 59.0 in | Wt 220.0 lb

## 2017-03-09 DIAGNOSIS — N393 Stress incontinence (female) (male): Secondary | ICD-10-CM | POA: Diagnosis not present

## 2017-03-09 DIAGNOSIS — Z124 Encounter for screening for malignant neoplasm of cervix: Secondary | ICD-10-CM | POA: Insufficient documentation

## 2017-03-09 DIAGNOSIS — Z01419 Encounter for gynecological examination (general) (routine) without abnormal findings: Secondary | ICD-10-CM | POA: Diagnosis not present

## 2017-03-09 MED ORDER — VALACYCLOVIR HCL 500 MG PO TABS: 500.0000 mg | ORAL_TABLET | Freq: Two times a day (BID) | ORAL | 0 refills | Status: DC

## 2017-03-09 NOTE — Progress Notes (Signed)
61 y.o. G3P3 MarriedCaucasianF here for annual exam.  Doing well.  No vaginal bleeding.  Diagnosed with coronary artery calcification.  Seeing Dr. Radford Pax yearly.  On Lipitor.  No vaginal bleeding.    Patient's last menstrual period was 02/12/2011.          Sexually active: Yes.    The current method of family planning is post menopausal status.    Exercising: Yes.    walking, weights, water aerobics Smoker:  no  Health Maintenance: Pap:  12/29/14 WNL; 06/23/13 WNL History of abnormal Pap:  no MMG:  07/07/16 BIRADS1, Density A, [facility] Colonoscopy:  09/10/15 Polyps. Repeat 5 years BMD:   None TDaP:  01/30/14 Pneumonia vaccine(s):  PCP Zostavax:   05/25/12; discuss new vaccine Hep C testing: 03/05/16 -- negative Screening Labs: PCP   reports that she has never smoked. She has never used smokeless tobacco. She reports that she drinks about 1.2 oz of alcohol per week . She reports that she does not use drugs.  Past Medical History:  Diagnosis Date  . Arthritis    knees, back  . Carcinoid tumor of lung 2012   right found incidentally on chest xray eval for r/o vasxculitis   . Coronary artery calcification 07/23/2016  . Double vision    history, no current problem  . Dysrhythmia    irregular occassional, no current problem  . Endometrial cells on cervical Pap smear inconsistent w/LMP 6/11   benign proliferative endometrium on endometrial biopsy  . HNP (herniated nucleus pulposus), cervical 01/22/2011   C6-C7  Has seen dr Vertell Limber for this and Dr Tonita Cong  Positional numbness in hands no weakness    . Hypertension   . Hypothyroidism   . 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 Obstructive sleep apnea 07/27/2014   NPSG 07/2014:  AHI 85/hr, optimal cpap 10cm.  Download 08/2014:  Good compliance, breakthru apnea on 10cm.  Changed to auto 5-12cm >> good control of AHI on f/u download ONO on CPAP:      . Sleep apnea    uses CPAP nightly  . SUI (stress urinary incontinence, female)    h/o     Past Surgical History:  Procedure Laterality Date  . CESAREAN SECTION     x 1 - twins  . COLONOSCOPY  07/31/05   brodie - polyps and bx's  . DILATATION & CURRETTAGE/HYSTEROSCOPY WITH RESECTOCOPE N/A 07/18/2013   Procedure: DILATATION & CURETTAGE/HYSTEROSCOPY WITH RESECTOCOPE;  Surgeon: Lyman Speller, MD;  Location: [facility];  Service: Gynecology;  Laterality: N/A;  mass resection  . Lung tumor removed     Rt lung carcinoid  . MOUTH SURGERY     pre-cancerous ulcer removed  . SHOULDER OPEN ROTATOR CUFF REPAIR  07/20/2010   Right  . WISDOM TOOTH EXTRACTION      Current Outpatient Prescriptions  Medication Sig Dispense Refill  . amLODipine (NORVASC) 5 MG tablet TAKE 1 TABLET DAILY 90 tablet 1  . aspirin 81 MG tablet Take 81 mg by mouth daily.    [name] atorvastatin (LIPITOR) 40 MG tablet Take 1 tablet (40 mg total) by mouth daily. 90 tablet 3  . CALCIUM PO Take by mouth 2 (two) times daily.    . Cholecalciferol (VITAMIN D) 2000 UNITS CAPS Take by mouth.    . hydrochlorothiazide (MICROZIDE) 12.5 MG capsule TAKE 1 CAPSULE DAILY 90 capsule 0  . methocarbamol (ROBAXIN) 750 MG tablet 1-2 po tid  PO for muscle spasm 60 tablet 1  . metoprolol succinate (TOPROL-XL)  50 MG 24 hr tablet Take 1 tablet (50 mg total) by mouth daily. 90 tablet 3  . Multiple Vitamins-Minerals (EYE VITAMINS PO) Take 1 capsule by mouth 2 (two) times daily. "Eye caps"    . Multiple Vitamins-Minerals (MULTIVITAMIN PO) Take 1 tablet by mouth daily.     [name] NYAMYC powder as needed.    . Omega-3 Fatty Acids (FISH OIL PO) Take 1 capsule by mouth daily.     [name] SYNTHROID 137 MCG tablet Take 1 tablet (137 mcg total) by mouth daily. 90 tablet 1  . triamcinolone ointment (KENALOG) 0.5 % Apply 1 application topically 2 (two) times daily. Can use for up to 7 days with flare. 60 g 3   No current facility-administered medications for this visit.     Family History  Problem Relation Age of Onset  . Stroke Mother     died age 55   . Hypertension Mother   . Coronary artery disease Father     died age 72  . Hypertension Father   . Parkinson's disease Brother   . Diabetes Brother   . Hypertension Brother   . Hypertension Maternal Uncle   . Colon cancer Neg Hx   . Rectal cancer Neg Hx   . Stomach cancer Neg Hx     ROS:  Pertinent items are noted in HPI.  Otherwise, a comprehensive ROS was negative.  Exam:   BP 118/70 (BP Location: Right Arm, Patient Position: Sitting, Cuff Size: Normal)   Pulse 76   Resp 16   Ht '4\' 11"'$  (1.499 m)   Wt 220 lb (99.8 kg)   LMP 02/12/2011   BMI 44.43 kg/m   Weight change: -4#  Height: '4\' 11"'$  (149.9 cm)  Ht Readings from Last 3 Encounters:  03/09/17 '4\' 11"'$  (1.499 m)  02/17/17 5' (1.524 m)  01/27/17 5' (1.524 m)    General appearance: alert, cooperative and appears stated age Head: Normocephalic, without obvious abnormality, atraumatic Neck: no adenopathy, supple, symmetrical, trachea midline and thyroid normal to inspection and palpation Lungs: clear to auscultation bilaterally Breasts: normal appearance, no masses or tenderness Heart: regular rate and rhythm Abdomen: soft, non-tender; bowel sounds normal; no masses,  no organomegaly Extremities: extremities normal, atraumatic, no cyanosis or edema Skin: Skin color, texture, turgor normal. No rashes or lesions Lymph nodes: Cervical, supraclavicular, and axillary nodes normal. No abnormal inguinal nodes palpated Neurologic: Grossly normal   Pelvic: External genitalia:  no lesions              Urethra:  normal appearing urethra with no masses, tenderness or lesions              Bartholins and Skenes: normal                 Vagina: normal appearing vagina with normal color and discharge, no lesions              Cervix: no lesions              Pap taken: Yes.   Bimanual Exam:  Uterus:  normal size, contour, position, consistency, mobility, non-tender              Adnexa: normal adnexa and no mass, fullness, tenderness                Rectovaginal: Confirms               Anus:  normal sphincter tone, no lesions  Chaperone was present for exam.  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   Polycythemia vera H/o abnormal lipid profile with coronary calcifications  P:   Mammogram guidelines reviewed pap smear with HR HPV obtained today No RF for triamcinolone needed.  Pt will call when needed. Rx for valtrex '500mg'$  bid x 3 days.  #6/0RF given if needed Shingrix order given to pt as well. Return annually or prn

## 2017-03-11 LAB — CYTOLOGY - PAP
DIAGNOSIS: NEGATIVE
HPV: NOT DETECTED

## 2017-03-20 ENCOUNTER — Telehealth: Payer: Self-pay | Admitting: Obstetrics & Gynecology

## 2017-03-20 NOTE — Telephone Encounter (Signed)
Received fax from [facility] regarding multiple failed attempts to contact patient for referral scheduling with Ileana Roup. Fax notes voicemails left on 4-11, 4-12, 4-13, and 4-17.   Placed a call to patients mobile number where I was forwarded straight to voicemail. I left contact information to return call. I placed a call to patients home number, answering machine identified correct residence, left contact details to return call.

## 2017-04-14 ENCOUNTER — Other Ambulatory Visit

## 2017-04-20 NOTE — Progress Notes (Signed)
Chief Complaint  Patient presents with  . Annual Exam  . Medication Management  . Hypertension  . Hypothyroidism    HPI: Patient  Nichole Smith  61 y.o. comes in today for Preventive Health Care visit  And med eval BP : bp getting lower   Obesity working on dec binge eating  So not going to weight watchers at this time  Thyroid  Med continues LIPIDS on meds for elevated cac  On yearly chest x ray surveillance   Health Maintenance  Topic Date Due  . HIV Screening  04/21/2018 (Originally 08/27/1971)  . INFLUENZA VACCINE  07/01/2017  . MAMMOGRAM  07/07/2018  . PAP SMEAR  03/09/2020  . COLONOSCOPY  09/09/2020  . TETANUS/TDAP  01/31/2024  . Hepatitis C Screening  Completed   Health Maintenance Review LIFESTYLE:  Exercise:   A lot   2 hours water   30  5 x per week.  Tobacco/ETS: no Alcohol:  Week 2  Sugar beverages: no Sleep: 8-7 Drug use: no HH of  2 dog  Work: no     ROS:  Get intermittne cramp right hand thumb index no weakness numbness or other areas GEN/ HEENT: No fever, significant weight changes sweats headaches vision problems hearing changes, CV/ PULM; No chest pain shortness of breath cough, syncope,edema  change in exercise tolerance. GI /GU: No adominal pain, vomiting, change in bowel habits. No blood in the stool. No significant GU symptoms. SKIN/HEME: ,no acute skin rashes suspicious lesions or bleeding. No lymphadenopathy, nodules, masses.  NEURO/ PSYCH:  No neurologic signs such as weakness numbness. No depression anxiety. IMM/ Allergy: No unusual infections.  Allergy .   REST of 12 system review negative except as per HPI   Past Medical History:  Diagnosis Date  . Arthritis    knees, back  . Carcinoid tumor of lung 2012   right found incidentally on chest xray eval for r/o vasxculitis   . Coronary artery calcification 07/23/2016  . Double vision    history, no current problem  . Dysrhythmia    irregular occassional, no current problem  .  Endometrial cells on cervical Pap smear inconsistent w/LMP 6/11   benign proliferative endometrium on endometrial biopsy  . HNP (herniated nucleus pulposus), cervical 01/22/2011   C6-C7  Has seen dr Vertell Limber for this and Dr Tonita Cong  Positional numbness in hands no weakness    . Hypertension   . Hypothyroidism   . 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 Obstructive sleep apnea 07/27/2014   NPSG 07/2014:  AHI 85/hr, optimal cpap 10cm.  Download 08/2014:  Good compliance, breakthru apnea on 10cm.  Changed to auto 5-12cm >> good control of AHI on f/u download ONO on CPAP:      . Sleep apnea    uses CPAP nightly  . SUI (stress urinary incontinence, female)    h/o    Past Surgical History:  Procedure Laterality Date  . CESAREAN SECTION     x 1 - twins  . COLONOSCOPY  07/31/05   brodie - polyps and bx's  . DILATATION & CURRETTAGE/HYSTEROSCOPY WITH RESECTOCOPE N/A 07/18/2013   Procedure: DILATATION & CURETTAGE/HYSTEROSCOPY WITH RESECTOCOPE;  Surgeon: Lyman Speller, MD;  Location: [facility];  Service: Gynecology;  Laterality: N/A;  mass resection  . Lung tumor removed     Rt lung carcinoid  . MOUTH SURGERY     pre-cancerous ulcer removed  . SHOULDER OPEN ROTATOR CUFF REPAIR  07/20/2010   Right  .  WISDOM TOOTH EXTRACTION      Family History  Problem Relation Age of Onset  . Stroke Mother        died age 16  . Hypertension Mother   . Coronary artery disease Father        died age 36  . Hypertension Father   . Parkinson's disease Brother   . Diabetes Brother   . Hypertension Brother   . Hypertension Maternal Uncle   . Colon cancer Neg Hx   . Rectal cancer Neg Hx   . Stomach cancer Neg Hx     Social History   Social History  . Marital status: Married    Spouse name: N/A  . Number of children: 3  . Years of education: N/A   Occupational History  . retied [employer]   Social History Main Topics  . Smoking status: Never Smoker  . Smokeless tobacco:  Never Used  . Alcohol use 1.2 oz/week    2 Glasses of wine per week  . Drug use: No  . Sexual activity: Yes    Partners: Male    Birth control/ protection: None, Post-menopausal   Other Topics Concern  . None   Social History Narrative   hhof 2    2 Children at college and  beyond      [name]    Married    Retired age 65 2015    Outpatient Medications Prior to Visit  Medication Sig Dispense Refill  . amLODipine (NORVASC) 5 MG tablet TAKE 1 TABLET DAILY 90 tablet 1  . aspirin 81 MG tablet Take 81 mg by mouth daily.    [name] atorvastatin (LIPITOR) 40 MG tablet Take 1 tablet (40 mg total) by mouth daily. 90 tablet 3  . CALCIUM PO Take by mouth 2 (two) times daily.    . Cholecalciferol (VITAMIN D) 2000 UNITS CAPS Take by mouth.    . methocarbamol (ROBAXIN) 750 MG tablet 1-2 po tid  PO for muscle spasm 60 tablet 1  . Multiple Vitamins-Minerals (EYE VITAMINS PO) Take 1 capsule by mouth 2 (two) times daily. "Eye caps"    . Omega-3 Fatty Acids (FISH OIL PO) Take 1 capsule by mouth daily.     [name] SYNTHROID 137 MCG tablet Take 1 tablet (137 mcg total) by mouth daily. 90 tablet 1  . triamcinolone ointment (KENALOG) 0.5 % Apply 1 application topically 2 (two) times daily. Can use for up to 7 days with flare. 60 g 3  . valACYclovir (VALTREX) 500 MG tablet Take 1 tablet (500 mg total) by mouth 2 (two) times daily. 1 tablet po QD. 6 tablet 0  . hydrochlorothiazide (MICROZIDE) 12.5 MG capsule TAKE 1 CAPSULE DAILY 90 capsule 0  . metoprolol succinate (TOPROL-XL) 50 MG 24 hr tablet Take 1 tablet (50 mg total) by mouth daily. 90 tablet 3  . NYAMYC powder as needed.    . Multiple Vitamins-Minerals (MULTIVITAMIN PO) Take 1 tablet by mouth daily.      No facility-administered medications prior to visit.      EXAM:  BP 110/80 (BP Location: Right Arm, Patient Position: Sitting, Cuff Size: Large)   Pulse 72   Temp 97.8 F (36.6 C) (Oral)   Ht '4\' 11"'$  (1.499 m)   Wt 220 lb 3.2 oz (99.9 kg)    LMP 02/12/2011   BMI 44.48 kg/m   Body mass index is 44.48 kg/m. Wt Readings from Last 3 Encounters:  04/21/17 220 lb 3.2 oz (  99.9 kg)  03/09/17 220 lb (99.8 kg)  02/17/17 217 lb 9.6 oz (98.7 kg)    Physical Exam: Vital signs reviewed IAX:KPVV is a well-developed well-nourished alert cooperative    who appearsr stated age in no acute distress.  HEENT: normocephalic atraumatic , Eyes: PERRL EOM's full, conjunctiva clear, Nares: paten,t no deformity discharge or tenderness., Ears: no deformity EAC's clear TMs with normal landmarks. Mouth: clear OP, no lesions, edema.  Moist mucous membranes. Dentition in adequate repair. NECK: supple without masses, thyromegaly or bruits. CHEST/PULM:  Clear to auscultation and percussion breath sounds equal no wheeze , rales or rhonchi. No chest wall deformities or tenderness. Breast: normal by inspection . No dimpling, discharge, masses, tenderness or discharge . CV: PMI is nondisplaced, S1 S2 no gallops, murmurs, rubs. Peripheral pulses are full without delay.No JVD .  ABDOMEN: Bowel sounds normal nontender  No guard or rebound, no hepato splenomegal no CVA tenderness.  No hernia. Extremtities:  No clubbing cyanosis or edema, no acute joint swelling or redness no focal atrophy NEURO:  Oriented x3, cranial nerves 3-12 appear to be intact, no obvious focal weakness,gait within normal limits no abnormal reflexes or asymmetrical SKIN: No acute rashes normal turgor, color, no bruising or petechiae. PSYCH: Oriented, good eye contact, no obvious depression anxiety, cognition and judgment appear normal. LN: no cervical axillary inguinal adenopathy    BP Readings from Last 3 Encounters:  04/21/17 110/80  03/09/17 118/70  02/17/17 124/86   Wt Readings from Last 3 Encounters:  04/21/17 220 lb 3.2 oz (99.9 kg)  03/09/17 220 lb (99.8 kg)  02/17/17 217 lb 9.6 oz (98.7 kg)    Lab results reviewed with patient   ASSESSMENT AND PLAN:  Discussed the  following assessment and plan:  Visit for preventive health examination - Plan: Basic metabolic panel, CBC with Differential/Platelet, Hepatic function panel, Lipid panel, TSH  Essential hypertension - try dec meto  cause bp is lower  and tryin got  minimize meds and se - Plan: Basic metabolic panel, CBC with Differential/Platelet, Hepatic function panel, Lipid panel, TSH  Medication management - Plan: Basic metabolic panel, CBC with Differential/Platelet, Hepatic function panel, Lipid panel, TSH  Atherosclerotic plaque - Plan: Basic metabolic panel, CBC with Differential/Platelet, Hepatic function panel, Lipid panel, TSH  Elevated hemoglobin (HCC) - felt from OSA? - Plan: Basic metabolic panel, CBC with Differential/Platelet, Hepatic function panel, Lipid panel, TSH  Obstructive sleep apnea - Plan: Basic metabolic panel, CBC with Differential/Platelet, Hepatic function panel, Lipid panel, TSH  BMI 40.0-44.9, adult (HCC) - Plan: Basic metabolic panel, CBC with Differential/Platelet, Hepatic function panel, Lipid panel, TSH  Hypothyroidism, unspecified type - tsh monitoring Due for labs  Will leg [name] know if  Cramps  persistent or progressive  Disc   Chronic disease management and Shared Decision Making above   And meds   Patient Care Team: Panosh, Standley Brooking, MD as PCP - Patsi Sears, MD (Dermatology) Megan Salon, MD as Attending Physician (Gynecology) Lafayette Dragon, MD (Inactive) (Gastroenterology) Gaye Pollack, MD (Cardiothoracic Surgery) Earlie Server, MD as Attending Physician (Orthopedic Surgery) Patient Instructions  Decrease  The metoprolol  25 mg    1/2 per  Day.   Follow bp readings    Let us know if not at goal .   Will notify you  of labs when available.   Continue lifestyle intervention healthy eating and exercise .  cpx and labs   In a  Year or as needed .  Standley Brooking. Panosh M.D.

## 2017-04-21 ENCOUNTER — Ambulatory Visit (INDEPENDENT_AMBULATORY_CARE_PROVIDER_SITE_OTHER): Admitting: Internal Medicine

## 2017-04-21 ENCOUNTER — Encounter: Payer: Self-pay | Admitting: Internal Medicine

## 2017-04-21 ENCOUNTER — Other Ambulatory Visit: Payer: Self-pay

## 2017-04-21 VITALS — BP 110/80 | HR 72 | Temp 97.8°F | Ht 59.0 in | Wt 220.2 lb

## 2017-04-21 DIAGNOSIS — Z0000 Encounter for general adult medical examination without abnormal findings: Secondary | ICD-10-CM

## 2017-04-21 DIAGNOSIS — E039 Hypothyroidism, unspecified: Secondary | ICD-10-CM | POA: Diagnosis not present

## 2017-04-21 DIAGNOSIS — Z79899 Other long term (current) drug therapy: Secondary | ICD-10-CM

## 2017-04-21 DIAGNOSIS — D582 Other hemoglobinopathies: Secondary | ICD-10-CM | POA: Diagnosis not present

## 2017-04-21 DIAGNOSIS — I7090 Unspecified atherosclerosis: Secondary | ICD-10-CM

## 2017-04-21 DIAGNOSIS — Z6841 Body Mass Index (BMI) 40.0 and over, adult: Secondary | ICD-10-CM

## 2017-04-21 DIAGNOSIS — I10 Essential (primary) hypertension: Secondary | ICD-10-CM

## 2017-04-21 DIAGNOSIS — G4733 Obstructive sleep apnea (adult) (pediatric): Secondary | ICD-10-CM

## 2017-04-21 MED ORDER — HYDROCHLOROTHIAZIDE 12.5 MG PO CAPS: 12.5000 mg | ORAL_CAPSULE | Freq: Every day | ORAL | 2 refills | Status: DC

## 2017-04-21 MED ORDER — METOPROLOL SUCCINATE ER 50 MG PO TB24: 25.0000 mg | ORAL_TABLET | Freq: Every day | ORAL | 3 refills | Status: DC

## 2017-04-21 NOTE — Patient Instructions (Addendum)
Decrease  The metoprolol  25 mg    1/2 per  Day.   Follow bp readings    Let us know if not at goal .   Will notify you  of labs when available.   Continue lifestyle intervention healthy eating and exercise .  cpx and labs   In a  Year or as needed .

## 2017-04-30 ENCOUNTER — Other Ambulatory Visit (INDEPENDENT_AMBULATORY_CARE_PROVIDER_SITE_OTHER)

## 2017-04-30 DIAGNOSIS — R399 Unspecified symptoms and signs involving the genitourinary system: Secondary | ICD-10-CM | POA: Diagnosis not present

## 2017-04-30 LAB — LIPID PANEL
CHOL/HDL RATIO: 2
Cholesterol: 109 mg/dL (ref 0–200)
HDL: 47.1 mg/dL (ref >39.00)
LDL Cholesterol: 50 mg/dL (ref 0–99)
NonHDL: 61.91
TRIGLYCERIDES: 58 mg/dL (ref 0.0–149.0)
VLDL: 11.6 mg/dL (ref 0.0–40.0)

## 2017-04-30 LAB — HEPATIC FUNCTION PANEL
ALK PHOS: 73 U/L (ref 39–117)
ALT: 21 U/L (ref 0–35)
AST: 20 U/L (ref 0–37)
Albumin: 3.8 g/dL (ref 3.5–5.2)
BILIRUBIN DIRECT: 0.1 mg/dL (ref 0.0–0.3)
TOTAL PROTEIN: 6.8 g/dL (ref 6.0–8.3)
Total Bilirubin: 0.5 mg/dL (ref 0.2–1.2)

## 2017-04-30 LAB — BASIC METABOLIC PANEL
BUN: 23 mg/dL (ref 6–23)
CALCIUM: 9.2 mg/dL (ref 8.4–10.5)
CHLORIDE: 103 meq/L (ref 96–112)
CO2: 31 meq/L (ref 19–32)
CREATININE: 0.71 mg/dL (ref 0.40–1.20)
GFR: 88.46 mL/min (ref >60.00)
GLUCOSE: 98 mg/dL (ref 70–99)
Potassium: 4 mEq/L (ref 3.5–5.1)
Sodium: 141 mEq/L (ref 135–145)

## 2017-04-30 LAB — CBC WITH DIFFERENTIAL/PLATELET
BASOS ABS: 0 10*3/uL (ref 0.0–0.1)
BASOS PCT: 0.4 % (ref 0.0–3.0)
EOS ABS: 0.1 10*3/uL (ref 0.0–0.7)
Eosinophils Relative: 2 % (ref 0.0–5.0)
HEMATOCRIT: 46.3 % — AB (ref 36.0–46.0)
Hemoglobin: 15.6 g/dL — ABNORMAL HIGH (ref 12.0–15.0)
LYMPHS PCT: 30 % (ref 12.0–46.0)
Lymphs Abs: 2.1 10*3/uL (ref 0.7–4.0)
MCHC: 33.6 g/dL (ref 30.0–36.0)
MCV: 91.1 fl (ref 78.0–100.0)
MONO ABS: 0.5 10*3/uL (ref 0.1–1.0)
Monocytes Relative: 7.3 % (ref 3.0–12.0)
NEUTROS ABS: 4.2 10*3/uL (ref 1.4–7.7)
NEUTROS PCT: 60.3 % (ref 43.0–77.0)
Platelets: 256 10*3/uL (ref 150.0–400.0)
RBC: 5.09 Mil/uL (ref 3.87–5.11)
RDW: 12.8 % (ref 11.5–15.5)
WBC: 7 10*3/uL (ref 4.0–10.5)

## 2017-04-30 LAB — TSH: TSH: 1.4 u[IU]/mL (ref 0.35–4.50)

## 2017-05-02 ENCOUNTER — Other Ambulatory Visit: Payer: Self-pay | Admitting: Internal Medicine

## 2017-05-18 ENCOUNTER — Ambulatory Visit (INDEPENDENT_AMBULATORY_CARE_PROVIDER_SITE_OTHER): Admitting: Pulmonary Disease

## 2017-05-18 ENCOUNTER — Encounter: Payer: Self-pay | Admitting: Pulmonary Disease

## 2017-05-18 VITALS — BP 118/70 | HR 60 | Ht 60.0 in | Wt 218.0 lb

## 2017-05-18 DIAGNOSIS — G4733 Obstructive sleep apnea (adult) (pediatric): Secondary | ICD-10-CM

## 2017-05-18 DIAGNOSIS — Z6841 Body Mass Index (BMI) 40.0 and over, adult: Secondary | ICD-10-CM

## 2017-05-18 NOTE — Progress Notes (Signed)
Current Outpatient Prescriptions on File Prior to Visit  Medication Sig  . amLODipine (NORVASC) 5 MG tablet TAKE 1 TABLET DAILY  . aspirin 81 MG tablet Take 81 mg by mouth daily.  [name] atorvastatin (LIPITOR) 40 MG tablet Take 1 tablet (40 mg total) by mouth daily.  [name] CALCIUM PO Take by mouth 2 (two) times daily.  . Cholecalciferol (VITAMIN D) 2000 UNITS CAPS Take by mouth.  . hydrochlorothiazide (MICROZIDE) 12.5 MG capsule Take 1 capsule (12.5 mg total) by mouth daily.  . methocarbamol (ROBAXIN) 750 MG tablet 1-2 po tid  PO for muscle spasm  . metoprolol succinate (TOPROL-XL) 50 MG 24 hr tablet Take 1 tablet (50 mg total) by mouth daily.  . Multiple Vitamins-Minerals (EYE VITAMINS PO) Take 1 capsule by mouth 2 (two) times daily. "Eye caps"  . NYAMYC powder as needed.  . Omega-3 Fatty Acids (FISH OIL PO) Take 1 capsule by mouth daily.   [name] SHINGRIX injection   . SYNTHROID 137 MCG tablet TAKE 1 TABLET DAILY  . triamcinolone ointment (KENALOG) 0.5 % Apply 1 application topically 2 (two) times daily. Can use for up to 7 days with flare.  . valACYclovir (VALTREX) 500 MG tablet Take 1 tablet (500 mg total) by mouth 2 (two) times daily. 1 tablet po QD.   No current facility-administered medications on file prior to visit.      Chief Complaint  Patient presents with  . Follow-up    Pt wears CPAP nightly. Denies problems with mask/pressure. Pt notes having red marks on her face in the mornings which go away a few hours after taking off the mask.  DME: [facility].      Sleep tests PSG 07/10/14 >> AHI 85 CPAP 04/18/17 to 05/17/17 >> used on 30 of 30 nights with average 7 hrs 44 min.  Average AHI 0.9 with CPAP 14 cm H2O  Cardiac tests Echo 05/26/14 >> EF 65 to 70%  Past medical hx HTN, Hypothyroidism, Rt lung carcinoid 2012  Past surgical hx, Allergies, Family hx, Social hx all reviewed.  Vital Signs BP 118/70 (BP Location: Right Arm, Cuff Size: Normal)   Pulse 60   Ht 5' (1.524 m)   Wt 218 lb  (98.9 kg)   LMP 02/12/2011   SpO2 97%   BMI 42.58 kg/m   History of Present Illness Nichole Smith is a 61 y.o. female with obstructive sleep apnea.  She feels pressure from her mask.  She feels pressure is too high, but she has tolerated this.  She is not having trouble feeling sleepy during the day.  Physical Exam  General - pleasant Eyes - pupils reactive ENT - no sinus tenderness, no oral exudate, no LAN, 3+ tonsils Cardiac - regular, no murmur Chest - no wheeze, rales Abd - soft, non tender Ext - no edema Skin - no rashes Neuro - normal strength Psych - normal mood   CBC Latest Ref Rng & Units 04/30/2017 09/08/2016 03/05/2016  WBC 4.0 - 10.5 K/uL 7.0 7.5 8.8  Hemoglobin 12.0 - 15.0 g/dL 15.6(H) 15.1 15.7(H)  Hematocrit 36.0 - 46.0 % 46.3(H) 44.6 46.4(H)  Platelets 150.0 - 400.0 K/uL 256.0 252 251.0     Assessment/Plan  Obstructive sleep apnea. - she is compliant with CPAP and reports benefit - will change from 14 to 12 cm H2O  Obesity. - discussed importance of weight loss  Elevated hemoglobin. - only mildly elevated >> might her normal - will check ONO with CPAP >> if oxygen  is low, then she would need in lab titration    Patient Instructions  Will change CPAP to 12 cm H2O  Will arrange for overnight oxygen test on CPAP  Follow up in 1 year    Chesley Mires, MD  Pulmonary/Critical Care/Sleep Pager:  [phone] 05/18/2017, 1:47 PM

## 2017-05-18 NOTE — Patient Instructions (Signed)
Will change CPAP to 12 cm H2O  Will arrange for overnight oxygen test on CPAP  Follow up in 1 year

## 2017-06-16 ENCOUNTER — Ambulatory Visit
Admission: RE | Admit: 2017-06-16 | Discharge: 2017-06-16 | Disposition: A | Discharge status: Home / Self Care | Source: Ambulatory Visit | Attending: Internal Medicine | Admitting: Internal Medicine

## 2017-06-16 DIAGNOSIS — D3A090 Benign carcinoid tumor of the bronchus and lung: Secondary | ICD-10-CM

## 2017-06-18 ENCOUNTER — Ambulatory Visit: Admitting: Obstetrics & Gynecology

## 2017-06-28 ENCOUNTER — Encounter: Payer: Self-pay | Admitting: Internal Medicine

## 2017-06-28 ENCOUNTER — Other Ambulatory Visit: Payer: Self-pay | Admitting: Internal Medicine

## 2017-07-28 LAB — HM MAMMOGRAPHY

## 2017-07-30 ENCOUNTER — Encounter: Payer: Self-pay | Admitting: Internal Medicine

## 2017-08-07 NOTE — Progress Notes (Signed)
-  Chief Complaint  Patient presents with  . Neck Pain    HPI: Nichole Smith 61 y.o.  nneck issues  [name] has had an ongoing problem for a number of years with cervical spine disease. Her last MRI was in 2014 she's manage this with intermittent physical therapy traction. However over the last 3-4 weeks she is having more difficulty with pain in her right hand and left hand with numbness that is still somewhat positional when she stands it improves however sitting she gets numbness and pain. She has remote history of seeing the neurosurgeon years ago and was treated with physical therapy. She has had problems with her left rotator cuff physical therapy Dr. French [name] was her surgeon went back when. She's having more symptoms on the left hand than the right doesn't think she has weakness but sometimes has hard lift the [employer] this last week but feels better today She gets pain at night sometimes token old Flexeril with help and was able to sleep. No lower extremity symptoms with this Like a flu shot today.  Would like a referral for physical therapy that she is used in the past with [facility] physical therapy are to. ROS: See pertinent positives and negatives per HPI.  Past Medical History:  Diagnosis Date  . Arthritis    knees, back  . Carcinoid tumor of lung 2012   right found incidentally on chest xray eval for r/o vasxculitis   . Coronary artery calcification 07/23/2016  . Double vision    history, no current problem  . Dysrhythmia    irregular occassional, no current problem  . Endometrial cells on cervical Pap smear inconsistent w/LMP 6/11   benign proliferative endometrium on endometrial biopsy  . HNP (herniated nucleus pulposus), cervical 01/22/2011   C6-C7  Has seen dr Vertell Limber for this and Dr Tonita Cong  Positional numbness in hands no weakness    . Hypertension   . Hypothyroidism   . 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 Obstructive sleep apnea 07/27/2014     NPSG 07/2014:  AHI 85/hr, optimal cpap 10cm.  Download 08/2014:  Good compliance, breakthru apnea on 10cm.  Changed to auto 5-12cm >> good control of AHI on f/u download ONO on CPAP:      . Sleep apnea    uses CPAP nightly  . SUI (stress urinary incontinence, female)    h/o    Family History  Problem Relation Age of Onset  . Stroke Mother        died age 75  . Hypertension Mother   . Coronary artery disease Father        died age 34  . Hypertension Father   . Parkinson's disease Brother   . Diabetes Brother   . Hypertension Brother   . Hypertension Maternal Uncle   . Colon cancer Neg Hx   . Rectal cancer Neg Hx   . Stomach cancer Neg Hx     Social History   Social History  . Marital status: Married    Spouse name: N/A  . Number of children: 3  . Years of education: N/A   Occupational History  . retied [employer]   Social History Main Topics  . Smoking status: Never Smoker  . Smokeless tobacco: Never Used  . Alcohol use 1.2 oz/week    2 Glasses of wine per week  . Drug use: No  . Sexual activity: Yes    Partners: Male    Birth  control/ protection: None, Post-menopausal   Other Topics Concern  . None   Social History Narrative   hhof 2    2 Children at college and  beyond      [name]    Married    Retired age 9 2015    Outpatient Medications Prior to Visit  Medication Sig Dispense Refill  . amLODipine (NORVASC) 5 MG tablet TAKE 1 TABLET DAILY 90 tablet 1  . aspirin 81 MG tablet Take 81 mg by mouth daily.    [name] atorvastatin (LIPITOR) 40 MG tablet Take 1 tablet (40 mg total) by mouth daily. 90 tablet 3  . CALCIUM PO Take by mouth 2 (two) times daily.    . hydrochlorothiazide (MICROZIDE) 12.5 MG capsule Take 1 capsule (12.5 mg total) by mouth daily. 90 capsule 2  . methocarbamol (ROBAXIN) 750 MG tablet 1-2 po tid  PO for muscle spasm 60 tablet 1  . metoprolol succinate (TOPROL-XL) 50 MG 24 hr tablet Take 1 tablet (50 mg total)  by mouth daily. 90 tablet 3  . Multiple Vitamins-Minerals (EYE VITAMINS PO) Take 1 capsule by mouth 2 (two) times daily. "Eye caps"    . Omega-3 Fatty Acids (FISH OIL PO) Take 1 capsule by mouth daily.     [name] SHINGRIX injection     . SYNTHROID 137 MCG tablet TAKE 1 TABLET DAILY 90 tablet 1  . triamcinolone ointment (KENALOG) 0.5 % Apply 1 application topically 2 (two) times daily. Can use for up to 7 days with flare. 60 g 3  . valACYclovir (VALTREX) 500 MG tablet Take 1 tablet (500 mg total) by mouth 2 (two) times daily. 1 tablet po QD. 6 tablet 0  . Cholecalciferol (VITAMIN D) 2000 UNITS CAPS Take by mouth.    [name] NYAMYC powder as needed.     No facility-administered medications prior to visit.      EXAM:  BP 130/80 (BP Location: Right Arm, Patient Position: Sitting, Cuff Size: Large)   Pulse 71   Temp 98.2 F (36.8 C) (Oral)   Wt 225 lb 3.2 oz (102.2 kg)   LMP 02/12/2011   BMI 43.98 kg/m   Body mass index is 43.98 kg/m.  GENERAL: vitals reviewed and listed above, alert, oriented, appears well hydrated and in no acute distress HEENT: atraumatic, conjunctiva  clear, no obvious abnormalities on inspection of external nose and ears  NECK: no obvious masses on inspection palpation some tenderness mid spine Extremities no obvious atrophy good range of motion grip strength adequate 4.5-5 over interosseous muscles appear normal. CV: HRRR, no clubbing cyanosis or  peripheral edema nl cap refill  MS: moves all extremities without noticeable focal  abnormality PSYCH: pleasant and cooperative, no obvious depression or anxiety  DTRs are present no myoclonus notice fasciculations. 2014  mri reports tsent to scan  Multilevel cervical spondylosis central stenosis most pronounced at C5-C6 combination of disc osteophyte complexes and posterior ligamentum flavum redundancy produce moderate multilevel central stenosis multilevel foraminal stenosis most pronounced at C5-C6. ASSESSMENT AND  PLAN:  Discussed the following assessment and plan:  Cervical radicular pain - Plan: Ambulatory referral to Neurosurgery  Needs flu shot - Plan: Flu Vaccine QUAD 36+ mos IM  Neck pain - Plan: Ambulatory referral to Neurosurgery Ref to PT writtten  -Patient advised to return or notify health care team  if symptoms worsen ,persist or new concerns arise.  Patient Instructions  Try prednisone steroid dose taper can decrease quicker than scale given. Referral to physical  therapy of your choice. We'll do referral to Dr. Vertell Limber as we discussed. If you're getting increasing weakness fever other concerns contact us in the meantime. Okay to use low-dose muscle relaxant and night as needed in the short term.    Cervical Radiculopathy Cervical radiculopathy happens when a nerve in the neck (cervical nerve) is pinched or bruised. This condition can develop because of an injury or as part of the normal aging process. Pressure on the cervical nerves can cause pain or numbness that runs from the neck all the way down into the arm and fingers. Usually, this condition gets better with rest. Treatment may be needed if the condition does not improve. What are the causes? This condition may be caused by:  Injury.  Slipped (herniated) disk.  Muscle tightness in the neck because of overuse.  Arthritis.  Breakdown or degeneration in the bones and joints of the spine (spondylosis) due to aging.  Bone spurs that may develop near the cervical nerves.  What are the signs or symptoms? Symptoms of this condition include:  Pain that runs from the neck to the arm and hand. The pain can be severe or irritating. It may be worse when the neck is moved.  Numbness or weakness in the affected arm and hand.  How is this diagnosed? This condition may be diagnosed based on symptoms, medical history, and a physical exam. You may also have tests, including:  X-rays.  CT scan.  MRI.  Electromyogram  (EMG).  Nerve conduction tests.  How is this treated? In many cases, treatment is not needed for this condition. With rest, the condition usually gets better over time. If treatment is needed, options may include:  Wearing a soft neck collar for short periods of time.  Physical therapy to strengthen your neck muscles.  Medicines, such as NSAIDs, oral corticosteroids, or spinal injections.  Surgery. This may be needed if other treatments do not help. Various types of surgery may be done depending on the cause of your problems.  Follow these instructions at home: Managing pain  Take over-the-counter and prescription medicines only as told by your health care provider.  If directed, apply ice to the affected area. ? Put ice in a plastic bag. ? Place a towel between your skin and the bag. ? Leave the ice on for 20 minutes, 2-3 times per day.  If ice does not help, you can try using heat. Take a warm shower or warm bath, or use a heat pack as told by your health care provider.  Try a gentle neck and shoulder massage to help relieve symptoms. Activity  Rest as needed. Follow instructions from your health care provider about any restrictions on activities.  Do stretching and strengthening exercises as told by your health care provider or physical therapist. General instructions  If you were given a soft collar, wear it as told by your health care provider.  Use a flat pillow when you sleep.  Keep all follow-up visits as told by your health care provider. This is important. Contact a health care provider if:  Your condition does not improve with treatment. Get help right away if:  Your pain gets much worse and cannot be controlled with medicines.  You have weakness or numbness in your hand, arm, face, or leg.  You have a high fever.  You have a stiff, rigid neck.  You lose control of your bowels or your bladder (have incontinence).  You have trouble with walking,  balance,  or speaking. This information is not intended to replace advice given to you by your health care provider. Make sure you discuss any questions you have with your health care provider. Document Released: 08/12/2001 Document Revised: 04/24/2016 Document Reviewed: 01/11/2015 Elsevier Interactive Patient Education  2018 [employer]. Jara Feider M.D.

## 2017-08-10 ENCOUNTER — Encounter: Payer: Self-pay | Admitting: Internal Medicine

## 2017-08-10 ENCOUNTER — Ambulatory Visit (INDEPENDENT_AMBULATORY_CARE_PROVIDER_SITE_OTHER): Admitting: Internal Medicine

## 2017-08-10 VITALS — BP 130/80 | HR 71 | Temp 98.2°F | Wt 225.2 lb

## 2017-08-10 DIAGNOSIS — Z23 Encounter for immunization: Secondary | ICD-10-CM | POA: Diagnosis not present

## 2017-08-10 DIAGNOSIS — M542 Cervicalgia: Secondary | ICD-10-CM | POA: Diagnosis not present

## 2017-08-10 DIAGNOSIS — M5412 Radiculopathy, cervical region: Secondary | ICD-10-CM | POA: Diagnosis not present

## 2017-08-10 MED ORDER — CYCLOBENZAPRINE HCL 5 MG PO TABS: 5.0000 mg | ORAL_TABLET | Freq: Every evening | ORAL | 0 refills | Status: DC | PRN

## 2017-08-10 MED ORDER — PREDNISONE 20 MG PO TABS: 20.0000 mg | ORAL_TABLET | Freq: Every day | ORAL | 0 refills | Status: DC

## 2017-08-10 NOTE — Patient Instructions (Signed)
Try prednisone steroid dose taper can decrease quicker than scale given. Referral to physical therapy of your choice. We'll do referral to Dr. Vertell Limber as we discussed. If you're getting increasing weakness fever other concerns contact us in the meantime. Okay to use low-dose muscle relaxant and night as needed in the short term.    Cervical Radiculopathy Cervical radiculopathy happens when a nerve in the neck (cervical nerve) is pinched or bruised. This condition can develop because of an injury or as part of the normal aging process. Pressure on the cervical nerves can cause pain or numbness that runs from the neck all the way down into the arm and fingers. Usually, this condition gets better with rest. Treatment may be needed if the condition does not improve. What are the causes? This condition may be caused by:  Injury.  Slipped (herniated) disk.  Muscle tightness in the neck because of overuse.  Arthritis.  Breakdown or degeneration in the bones and joints of the spine (spondylosis) due to aging.  Bone spurs that may develop near the cervical nerves.  What are the signs or symptoms? Symptoms of this condition include:  Pain that runs from the neck to the arm and hand. The pain can be severe or irritating. It may be worse when the neck is moved.  Numbness or weakness in the affected arm and hand.  How is this diagnosed? This condition may be diagnosed based on symptoms, medical history, and a physical exam. You may also have tests, including:  X-rays.  CT scan.  MRI.  Electromyogram (EMG).  Nerve conduction tests.  How is this treated? In many cases, treatment is not needed for this condition. With rest, the condition usually gets better over time. If treatment is needed, options may include:  Wearing a soft neck collar for short periods of time.  Physical therapy to strengthen your neck muscles.  Medicines, such as NSAIDs, oral corticosteroids, or spinal  injections.  Surgery. This may be needed if other treatments do not help. Various types of surgery may be done depending on the cause of your problems.  Follow these instructions at home: Managing pain  Take over-the-counter and prescription medicines only as told by your health care provider.  If directed, apply ice to the affected area. ? Put ice in a plastic bag. ? Place a towel between your skin and the bag. ? Leave the ice on for 20 minutes, 2-3 times per day.  If ice does not help, you can try using heat. Take a warm shower or warm bath, or use a heat pack as told by your health care provider.  Try a gentle neck and shoulder massage to help relieve symptoms. Activity  Rest as needed. Follow instructions from your health care provider about any restrictions on activities.  Do stretching and strengthening exercises as told by your health care provider or physical therapist. General instructions  If you were given a soft collar, wear it as told by your health care provider.  Use a flat pillow when you sleep.  Keep all follow-up visits as told by your health care provider. This is important. Contact a health care provider if:  Your condition does not improve with treatment. Get help right away if:  Your pain gets much worse and cannot be controlled with medicines.  You have weakness or numbness in your hand, arm, face, or leg.  You have a high fever.  You have a stiff, rigid neck.  You lose control of your  bowels or your bladder (have incontinence).  You have trouble with walking, balance, or speaking. This information is not intended to replace advice given to you by your health care provider. Make sure you discuss any questions you have with your health care provider. Document Released: 08/12/2001 Document Revised: 04/24/2016 Document Reviewed: 01/11/2015 Elsevier Interactive Patient Education  [employer].

## 2017-08-21 ENCOUNTER — Encounter: Payer: Self-pay | Admitting: Internal Medicine

## 2017-08-26 ENCOUNTER — Ambulatory Visit (INDEPENDENT_AMBULATORY_CARE_PROVIDER_SITE_OTHER): Admitting: Cardiology

## 2017-08-26 VITALS — BP 130/72 | HR 72 | Ht 60.0 in | Wt 226.4 lb

## 2017-08-26 DIAGNOSIS — I2584 Coronary atherosclerosis due to calcified coronary lesion: Secondary | ICD-10-CM

## 2017-08-26 DIAGNOSIS — I10 Essential (primary) hypertension: Secondary | ICD-10-CM

## 2017-08-26 DIAGNOSIS — E785 Hyperlipidemia, unspecified: Secondary | ICD-10-CM

## 2017-08-26 DIAGNOSIS — I2510 Atherosclerotic heart disease of native coronary artery without angina pectoris: Secondary | ICD-10-CM | POA: Diagnosis not present

## 2017-08-26 DIAGNOSIS — R002 Palpitations: Secondary | ICD-10-CM | POA: Diagnosis not present

## 2017-08-26 NOTE — Progress Notes (Signed)
Cardiology Office Note:    Date:  08/26/2017   ID:  Nichole, Smith 1956/11/29, MRN [id number]  PCP:  Burnis Medin, MD  Cardiologist:  Fransico Him, MD   Referring MD: Burnis Medin, MD   Chief Complaint  Patient presents with  . Hypertension  . Hyperlipidemia    History of Present Illness:    Nichole Smith is a 61 y.o. 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 Cyndia Bent, HTN, OSA on CPAP followed by Dr. Halford Chessman and coronary artery calcifications on chest CT as well as calcifications of the aortic arch and carotid bifurcation.  She had a nuclear stress test 08/2016 for risk stratification that was low risk with no ischemia.    She is here today for followup and is doing well.  She denies any chest pain or pressure, SOB, DOE, PND, orthopnea, LE edema, dizziness or syncope. She is compliant with her meds and is tolerating meds with no SE.  She has a history of palpitations but has never worn a heart monitor.  Recently she has had an increased frequency of palpitations that she notices more when at rest and at night in bed.  They occur on a daily basis.    Past Medical History:  Diagnosis Date  . Arthritis    knees, back  . Carcinoid tumor of lung 2012   right found incidentally on chest xray eval for r/o vasxculitis   . Coronary artery calcification 07/23/2016  . Double vision    history, no current problem  . Dysrhythmia    irregular occassional, no current problem  . Endometrial cells on cervical Pap smear inconsistent w/LMP 6/11   benign proliferative endometrium on endometrial biopsy  . HNP (herniated nucleus pulposus), cervical 01/22/2011   C6-C7  Has seen dr Vertell Limber for this and Dr Tonita Cong  Positional numbness in hands no weakness    . Hypertension   . Hypothyroidism   . 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 Obstructive sleep apnea 07/27/2014   NPSG 07/2014:  AHI 85/hr, optimal cpap 10cm.  Download 08/2014:  Good  compliance, breakthru apnea on 10cm.  Changed to auto 5-12cm >> good control of AHI on f/u download ONO on CPAP:      . Sleep apnea    uses CPAP nightly  . SUI (stress urinary incontinence, female)    h/o    Past Surgical History:  Procedure Laterality Date  . CESAREAN SECTION     x 1 - twins  . COLONOSCOPY  07/31/05   brodie - polyps and bx's  . DILATATION & CURRETTAGE/HYSTEROSCOPY WITH RESECTOCOPE N/A 07/18/2013   Procedure: DILATATION & CURETTAGE/HYSTEROSCOPY WITH RESECTOCOPE;  Surgeon: Lyman Speller, MD;  Location: [facility];  Service: Gynecology;  Laterality: N/A;  mass resection  . Lung tumor removed     Rt lung carcinoid  . MOUTH SURGERY     pre-cancerous ulcer removed  . SHOULDER OPEN ROTATOR CUFF REPAIR  07/20/2010   Right  . WISDOM TOOTH EXTRACTION      Current Medications: No outpatient prescriptions have been marked as taking for the 08/26/17 encounter (Office Visit) with Sueanne Margarita, MD.     Allergies:   Neomycin and Ace inhibitors   Social History   Social History  . Marital status: Married    Spouse name: N/A  . Number of children: 3  . Years of education: N/A   Occupational History  .  retied [employer]   Social History Main Topics  . Smoking status: Never Smoker  . Smokeless tobacco: Never Used  . Alcohol use 1.2 oz/week    2 Glasses of wine per week  . Drug use: No  . Sexual activity: Yes    Partners: Male    Birth control/ protection: None, Post-menopausal   Other Topics Concern  . Not on file   Social History Narrative   hhof 2    2 Children at college and  beyond      [name]    Married    Retired age 45 2015     Family History: The patient's family history includes Coronary artery disease in her father; Diabetes in her brother; Hypertension in her brother, father, maternal uncle, and mother; Parkinson's disease in her brother; Stroke in her mother. There is no history of Colon cancer, Rectal cancer,  or Stomach cancer.  ROS:   Please see the history of present illness.    ROS  All other systems reviewed and negative.   EKGs/Labs/Other Studies Reviewed:    The following studies were reviewed today: none  EKG:  EKG is not ordered today.    Recent Labs: 04/30/2017: ALT 21; BUN 23; Creatinine, Ser 0.71; Hemoglobin 15.6; Platelets 256.0; Potassium 4.0; Sodium 141; TSH 1.40   Recent Lipid Panel    Component Value Date/Time   CHOL 109 04/30/2017 0838   CHOL 110 11/03/2016 0000   TRIG 58.0 04/30/2017 0838   TRIG 54 11/03/2016 0000   HDL 47.10 04/30/2017 0838   HDL 48 (L) 11/03/2016 0000   CHOLHDL 2 04/30/2017 0838   VLDL 11.6 04/30/2017 0838   LDLCALC 50 04/30/2017 0838   LDLCALC 49 11/03/2016 0000    Physical Exam:    VS:  LMP 02/12/2011     Wt Readings from Last 3 Encounters:  08/10/17 225 lb 3.2 oz (102.2 kg)  05/18/17 218 lb (98.9 kg)  04/21/17 220 lb 3.2 oz (99.9 kg)     GEN:  Well nourished, well developed in no acute distress HEENT: Normal NECK: No JVD; No carotid bruits LYMPHATICS: No lymphadenopathy CARDIAC: RRR, no murmurs, rubs, gallops RESPIRATORY:  Clear to auscultation without rales, wheezing or rhonchi  ABDOMEN: Soft, non-tender, non-distended MUSCULOSKELETAL:  No edema; No deformity  SKIN: Warm and dry NEUROLOGIC:  Alert and oriented x 3 PSYCHIATRIC:  Normal affect   ASSESSMENT:    1. Coronary artery calcification   2. Essential hypertension   3. Hyperlipidemia LDL goal <70   4. Heart palpitations    PLAN:    In order of problems listed above:  1.  Coronary artery calcifications on chest CT - she had a nuclear stress test a year ago that showed no ischemia.  She has no anginal symptoms.    2.  HTN - Her BP is well controlled on exam today.  She will continue on amlodipine 5mg  daily, HCTZ 12.5mg  daily and Toprol XL 50mg  daily.  Creatinine was 0.71 in May and potassium stable at 4.   3.  Hyperlipidemia - she is tolerating her statin with  no problems.  She will continue on atorvastatin 40mg  daily.  Her last LDL was 50.    4. Palpitations that are sporadic but occurring more frequent.  She is in the age group where we can see afib and her mom died of a CVA.  I will get an event monitor to assess for PAF.  Medication Adjustments/Labs and Tests Ordered:  Current medicines are reviewed at length with the patient today.  Concerns regarding medicines are outlined above.  Orders Placed This Encounter  Procedures  . EKG 12-Lead   No orders of the defined types were placed in this encounter.   Signed, Fransico Him, MD  08/26/2017 9:47 AM    [place]

## 2017-08-26 NOTE — Patient Instructions (Signed)
Medication Instructions:  Your provider recommends that you continue on your current medications as directed. Please refer to the Current Medication list given to you today.    Labwork: None  Testing/Procedures: Your physician has recommended that you wear an event monitor. Event monitors are medical devices that record the heart's electrical activity. Doctors most often [name] these monitors to diagnose arrhythmias. Arrhythmias are problems with the speed or rhythm of the heartbeat. The monitor is a small, portable device. You can wear one while you do your normal daily activities. This is usually used to diagnose what is causing palpitations/syncope (passing out).  Follow-Up: Your provider wants you to follow-up in: 1 year with Dr. Radford Pax. You will receive a reminder letter in the mail two months in advance. If you don't receive a letter, please call our office to schedule the follow-up appointment.    Any Other Special Instructions Will Be Listed Below (If Applicable).     If you need a refill on your cardiac medications before your next appointment, please call your pharmacy.

## 2017-08-28 ENCOUNTER — Ambulatory Visit (INDEPENDENT_AMBULATORY_CARE_PROVIDER_SITE_OTHER)

## 2017-08-28 ENCOUNTER — Other Ambulatory Visit: Payer: Self-pay | Admitting: Cardiology

## 2017-08-28 DIAGNOSIS — R002 Palpitations: Secondary | ICD-10-CM | POA: Diagnosis not present

## 2017-09-22 ENCOUNTER — Encounter: Payer: Self-pay | Admitting: Internal Medicine

## 2017-09-22 DIAGNOSIS — M4802 Spinal stenosis, cervical region: Secondary | ICD-10-CM

## 2017-09-22 DIAGNOSIS — M5412 Radiculopathy, cervical region: Secondary | ICD-10-CM

## 2017-09-23 MED ORDER — CYCLOBENZAPRINE HCL 5 MG PO TABS: 5.0000 mg | ORAL_TABLET | Freq: Every evening | ORAL | 1 refills | Status: DC | PRN

## 2017-09-23 NOTE — Telephone Encounter (Signed)
Okay to refill the cyclobenzaprine x1  + Please order MRI cervical spine diagnosis pain and cervical radiculopathy.  With persistent symptoms.  Cervical stenosis.

## 2017-10-07 ENCOUNTER — Telehealth: Payer: Self-pay

## 2017-10-07 MED ORDER — DILTIAZEM HCL ER COATED BEADS 180 MG PO CP24: 180.0000 mg | ORAL_CAPSULE | Freq: Every day | ORAL | 3 refills | Status: DC

## 2017-10-07 NOTE — Telephone Encounter (Signed)
Patient made aware of results. Patient instructed to continue with BB and to stop taking amlodipine and will start Cardizem CD 180 mg daily. Patient is scheduled with Ellen Henri, [place] on 10/29/17. Patient verbalizes understanding and thanked me for the call.

## 2017-10-13 ENCOUNTER — Telehealth: Payer: Self-pay | Admitting: Internal Medicine

## 2017-10-13 ENCOUNTER — Ambulatory Visit
Admission: RE | Admit: 2017-10-13 | Discharge: 2017-10-13 | Disposition: A | Discharge status: Home / Self Care | Source: Ambulatory Visit | Attending: Internal Medicine | Admitting: Internal Medicine

## 2017-10-13 DIAGNOSIS — M4802 Spinal stenosis, cervical region: Secondary | ICD-10-CM

## 2017-10-13 DIAGNOSIS — M5412 Radiculopathy, cervical region: Secondary | ICD-10-CM

## 2017-10-13 NOTE — Telephone Encounter (Signed)
Received call report from [facility] Radiology Assistant at [facility] : Multilevel spondylosis as described, with relatively mild stenosis, of a multifactorial nature, at C3-4, C4-5, C5-6, and C7-T1. Any or all of these levels could contribute to radicular symptoms due to varying degrees of foraminal narrowing, as discussed above.  Large disc extrusion at C6-7, central to the RIGHT with cephalad migrated free fragment. Severe stenosis with RIGHT greater than LEFT cord compression as well as BILATERAL foraminal narrowing, also worse on the RIGHT.  Surgical consultation is warranted, as this degree of cord compression could lead to myelopathy, if on treated.  These results will be called to the ordering clinician or representative by the Radiologist Assistant, and communication documented in the PACS or zVision Dashboard.

## 2017-10-13 NOTE — Telephone Encounter (Signed)
MRI result report printed and given to Dr Regis Bill.

## 2017-10-13 NOTE — Telephone Encounter (Signed)
Result routed high priority and called flow coordinator Vita Barley Cox who will notify Dr Regis Bill

## 2017-10-13 NOTE — Telephone Encounter (Addendum)
Per Dr Regis Bill, patient needs to be seen by Neuro Surgeon ASAP.  Pt has large bulging disc, pinched nerves and cord compression.  Needs to be seen asap to prevent permanent damage.   Pt already has an appt with [facility] @ [address] on 10/19/17 Pt offered to try and get a sooner appt schedule this week and she refused stating that she will just wait until her scheduled appt on Monday 10/19/17. Pt advised to contact our office if symptoms worsen or any new symptoms arise in the meantime. Aware that I will make Dr Regis Bill aware that she is scheduled. Nothing further needed.

## 2017-10-19 ENCOUNTER — Encounter: Payer: Self-pay | Admitting: Cardiology

## 2017-10-27 ENCOUNTER — Ambulatory Visit (INDEPENDENT_AMBULATORY_CARE_PROVIDER_SITE_OTHER): Admitting: Obstetrics & Gynecology

## 2017-10-27 ENCOUNTER — Telehealth: Payer: Self-pay | Admitting: Obstetrics & Gynecology

## 2017-10-27 VITALS — BP 158/90 | HR 72 | Temp 98.2°F | Resp 16 | Ht 60.0 in | Wt 228.0 lb

## 2017-10-27 DIAGNOSIS — N3090 Cystitis, unspecified without hematuria: Secondary | ICD-10-CM

## 2017-10-27 DIAGNOSIS — N898 Other specified noninflammatory disorders of vagina: Secondary | ICD-10-CM

## 2017-10-27 DIAGNOSIS — R300 Dysuria: Secondary | ICD-10-CM

## 2017-10-27 LAB — POCT URINALYSIS DIPSTICK
BILIRUBIN UA: NEGATIVE
Glucose, UA: NEGATIVE
KETONES UA: NEGATIVE
Nitrite, UA: NEGATIVE
PROTEIN UA: NEGATIVE
Urobilinogen, UA: 0.2 E.U./dL
pH, UA: 5 (ref 5.0–8.0)

## 2017-10-27 MED ORDER — FLUCONAZOLE 150 MG PO TABS: ORAL_TABLET | ORAL | 0 refills | Status: DC

## 2017-10-27 MED ORDER — CEPHALEXIN 500 MG PO CAPS: 500.0000 mg | ORAL_CAPSULE | Freq: Four times a day (QID) | ORAL | 0 refills | Status: DC

## 2017-10-27 MED ORDER — ESTRADIOL 0.1 MG/GM VA CREA: TOPICAL_CREAM | VAGINAL | 1 refills | Status: DC

## 2017-10-27 NOTE — Progress Notes (Signed)
GYNECOLOGY  VISIT  CC:   Dysuria, vaginal discharge?  HPI: 61 y.o. G3P3 Married Caucasian female here for complaint of dysuria x 2 weeks.  This is accompanied by some mild change in frequency.  Pt reports she felt at first this would go away but seems to have persisted.  Increased water intake.  Denies back pain or fever.  Denies hematuria.  Is also having some mild vulvar itching and vaginal discharge.  Denies any PMP bleeding or vaginal odor.  Denies pelvic pain.  States she feels a little silly being here as this may be nothing.    Reviewed prior UTI's that pt has experineced in the last few years.  She has had a GBS+ urine culture that was accompanied by symptoms.  Advised pt this could be the issue and this may not make "full blown symptoms of a UTI" so she should not discredit how she feels.  Energy is about the same.    Has not tried anything for the symptoms  GYNECOLOGIC HISTORY: Patient's last menstrual period was 02/12/2011. Contraception: post menopausal  Menopausal hormone therapy: none  Patient Active Problem List   Diagnosis Date Noted  . Hyperlipidemia LDL goal <70 08/26/2017  . Heart palpitations 08/26/2017  . Hammertoe of right foot 01/06/2017  . Coronary artery calcification 07/23/2016  . Periodic limb movements of sleep 06/18/2016  . Atherosclerotic plaque 06/11/2016  . Piriformis syndrome of left side 10/24/2015  . Loss of transverse plantar arch of left foot 09/12/2015  . 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 Obstructive sleep apnea 07/27/2014  . Midline low back pain without sciatica 06/20/2014  . Polycythemia, secondary 03/22/2014  . Severe obesity (BMI >= 40) ([place]) 02/22/2014  . Elevated hemoglobin ([place]) 02/22/2014  . Left shoulder pain 02/04/2013  . 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 HNP (herniated nucleus pulposus), cervical 01/22/2011  . Visit for preventive health examination 01/22/2011  . PAIN IN JOINT, MULTIPLE SITES 01/21/2010  . Hypothyroidism  10/16/2008  . PALPITATIONS, RECURRENT 01/07/2008  . Essential hypertension 06/25/2007  . OSTEOARTHRITIS 06/25/2007    Past Medical History:  Diagnosis Date  . Arthritis    knees, back  . Carcinoid tumor of lung 2012   right found incidentally on chest xray eval for r/o vasxculitis   . Coronary artery calcification 07/23/2016  . Double vision    history, no current problem  . Dysrhythmia    irregular occassional, no current problem  . HNP (herniated nucleus pulposus), cervical 01/22/2011   C6-C7  Has seen dr Vertell Limber for this and Dr Tonita Cong  Positional numbness in hands no weakness    . Hypertension   . Hypothyroidism   . 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 Obstructive sleep apnea 07/27/2014   NPSG 07/2014:  AHI 85/hr, optimal cpap 10cm.  Download 08/2014:  Good compliance, breakthru apnea on 10cm.  Changed to auto 5-12cm >> good control of AHI on f/u download ONO on CPAP:      . SUI (stress urinary incontinence, female)    h/o    Past Surgical History:  Procedure Laterality Date  . CESAREAN SECTION     x 1 - twins  . DILATATION & CURRETTAGE/HYSTEROSCOPY WITH RESECTOCOPE N/A 07/18/2013   Procedure: DILATATION & CURETTAGE/HYSTEROSCOPY WITH RESECTOCOPE;  Surgeon: Lyman Speller, MD;  Location: [facility];  Service: Gynecology;  Laterality: N/A;  mass resection  . Lung tumor removed     Rt lung carcinoid  . MOUTH SURGERY     pre-cancerous ulcer removed  .  SHOULDER OPEN ROTATOR CUFF REPAIR  07/20/2010   Right  . WISDOM TOOTH EXTRACTION      MEDS:   Current Outpatient Medications on File Prior to Visit  Medication Sig Dispense Refill  . aspirin 81 MG tablet Take 81 mg by mouth daily.    [name] atorvastatin (LIPITOR) 40 MG tablet Take 1 tablet (40 mg total) by mouth daily. 90 tablet 3  . CALCIUM PO Take by mouth 2 (two) times daily.    . cyclobenzaprine (FLEXERIL) 5 MG tablet Take 1 tablet (5 mg total) by mouth at bedtime as needed and may repeat dose one time if needed for  muscle spasms. 30 tablet 1  . diltiazem (CARDIZEM CD) 180 MG 24 hr capsule Take 1 capsule (180 mg total) daily by mouth. 90 capsule 3  . hydrochlorothiazide (MICROZIDE) 12.5 MG capsule Take 1 capsule (12.5 mg total) by mouth daily. 90 capsule 2  . Multiple Vitamins-Minerals (EYE VITAMINS PO) Take 1 capsule by mouth 2 (two) times daily. "Eye caps"    . Omega-3 Fatty Acids (FISH OIL PO) Take 1 capsule by mouth daily.     [name] SYNTHROID 137 MCG tablet TAKE 1 TABLET DAILY 90 tablet 1  . triamcinolone ointment (KENALOG) 0.5 % Apply 1 application topically 2 (two) times daily. Can use for up to 7 days with flare. 60 g 3  . valACYclovir (VALTREX) 500 MG tablet Take 1 tablet (500 mg total) by mouth 2 (two) times daily. 1 tablet po QD. 6 tablet 0   No current facility-administered medications on file prior to visit.     ALLERGIES: Neomycin and Ace inhibitors  Family History  Problem Relation Age of Onset  . Stroke Mother        died age 30  . Hypertension Mother   . Coronary artery disease Father        died age 11  . Hypertension Father   . Parkinson's disease Brother   . Diabetes Brother   . Hypertension Brother   . Hypertension Maternal Uncle   . Colon cancer Neg Hx   . Rectal cancer Neg Hx   . Stomach cancer Neg Hx     SH:  Married, non smoker  Review of Systems  Genitourinary: Positive for dysuria and urgency.       Loss of urine spontaneously  Night urination   All other systems reviewed and are negative.   PHYSICAL EXAMINATION:    BP (!) 158/90 (BP Location: Right Arm, Patient Position: Sitting, Cuff Size: Large)   Pulse 72   Temp 98.2 F (36.8 C) (Oral)   Resp 16   Ht 5' (1.524 m)   Wt 228 lb (103.4 kg)   LMP 02/12/2011   BMI 44.53 kg/m     General appearance: alert, cooperative and appears stated age CV:  Regular rate and rhythm Lungs:  clear to auscultation, no wheezes, rales or rhonchi, symmetric air entry Abdomen: soft, non-tender; bowel sounds normal; no  masses,  no organomegaly  Pelvic: External genitalia: mild erythematous flat area to right of urethra c/w atrophic changes              Urethra:  normal appearing urethra with no masses, tenderness or lesions              Bartholins and Skenes: normal                 Vagina: normal appearing vagina with normal color, no blood noted, greyish and watery discharge  noted, no lesions              Cervix: no lesions              Bimanual Exam:  Uterus:  normal size, contour, position, consistency, mobility, non-tender              Adnexa: no mass, fullness, tenderness              Anus:   no lesions  Chaperone was present for exam.  Assessment: Urinary urgency Watery vaginal discharge Vulvar itching  Plan: Affirm pending Urine culture pending Diflucan 150mg  po x 1, repeat 72 hours.  #2/0RF Estrace cream externally nightly to area on vulva

## 2017-10-27 NOTE — Telephone Encounter (Signed)
Patient is having some bladder irritation and would like to see Dr Sabra Heck.

## 2017-10-27 NOTE — Telephone Encounter (Signed)
Spoke with patient. Patient states that she has been having ongoing "bladder irritation" for 2 weeks. Reports this occurs once a year and urine culture is usually negative. "I think it is usually urethritis." Denies any lower back pain, fever, or chills. Advised will need to be seen in office for further evaluation. Appointment scheduled for 10/27/2017 at 1:30 pm with Dr.Miller. Patient is agreeable to date and time.  Routing to provider for final review. Patient agreeable to disposition. Will close encounter.

## 2017-10-28 LAB — URINALYSIS, MICROSCOPIC ONLY: Casts: NONE SEEN /lpf

## 2017-10-29 ENCOUNTER — Ambulatory Visit (INDEPENDENT_AMBULATORY_CARE_PROVIDER_SITE_OTHER): Admitting: Cardiology

## 2017-10-29 ENCOUNTER — Encounter: Payer: Self-pay | Admitting: Cardiology

## 2017-10-29 VITALS — BP 138/86 | HR 73 | Ht 60.0 in | Wt 228.0 lb

## 2017-10-29 DIAGNOSIS — I491 Atrial premature depolarization: Secondary | ICD-10-CM

## 2017-10-29 LAB — VAGINITIS/VAGINOSIS, DNA PROBE
Candida Species: NEGATIVE
GARDNERELLA VAGINALIS: NEGATIVE
Trichomonas vaginosis: NEGATIVE

## 2017-10-29 LAB — URINE CULTURE

## 2017-10-29 MED ORDER — METOPROLOL SUCCINATE ER 50 MG PO TB24: 50.0000 mg | ORAL_TABLET | Freq: Every day | ORAL | 3 refills | Status: DC

## 2017-10-29 NOTE — Patient Instructions (Addendum)
Medication Instructions:  1. RESUME METOPROLOL SUCCINATE 50 MG DAILY; REFILL SENT TO [facility]   2. CONTINUE ALL OTHER MEDICATIONS AS PRESCRIBED  Labwork: NONE ORDERED TODAY  Testing/Procedures: NONE ORDERED TODAY  Follow-Up: 1. YOU WILL RECEIVE A REMINDER LETTER NEXT SUMMER 2019 TO MAKE AN APPT TO SEE DR. Radford Pax 08/2018; IF YOU DO NOT GET A LETTER PLEASE CALL THE OFFICE AND SCHEDULE APPT   Any Other Special Instructions Will Be Listed Below (If Applicable).  LIMIT CAFFEINE   STAY HYDRATED, DRINK MORE WATER   CALL IF YOUR PALPITATIONS INCREASE OR IF BLOOD PRESSURE CONSISTENTLY 150/90. [phone]   If you need a refill on your cardiac medications before your next appointment, please call your pharmacy.

## 2017-10-29 NOTE — Progress Notes (Signed)
10/29/2017 Nichole Smith   30-Sep-1956  [id number]  Primary Physician Panosh, Standley Brooking, MD Primary Cardiologist: Dr. Radford Pax   Reason for Visit/CC: f/u for Palpitations and PACs  HPI:  Nichole Smith is a 61 y.o. female who is being seen today for f/u for symptomatic PACs and medication management. She is followed by Dr. Radford Pax. 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 Cyndia Bent. Her OSA/CPAP is followed by Dr. Halford Chessman. She also was noted on previous chest CT to have coronary artery calcifications, however she had a NST 08/2016 that was negative for ischemia. She has also been asymptomatic w/o CP or dyspnea.   She was recently seen by Dr. Radford Pax 08/2017 for her annual check up. She denied CP and dyspnea but complained of more frequent palpitations. Her mother has a h/o atrial fibrillation. Given her symptoms, age and family history, Dr. Radford Pax ordered an outpatient monitor to further evaluate. This showed no atrial fib nor flutter, however frequent PACs were noted. Dr. Radford Pax recommended she continue metoprolol, but also changed her CCB from amlodipine to Cardizem 180 mg.   The patient presents back to clinic today for f/u. She notes little improvement after the addition of Cardizem. She still has frequent palpitations. Her BP has also been mildly elevated in the 283T systolic. She reports full compliance with CPAP nightly. She stays well hydrated with fluids. She drinks 1-2 cups of coffee a day. No ETOH. She is on Synthroid for hypothyroidism, which is followed by PCP.   Office BP is currently 138/86. HR is 73 bpm. Pt was ordered previously by Dr. Radford Pax to take 50 mg of metoprolol daily, however she notes that her PCP once reduced it down to 25 mg daily. The patient thinks she may benefit by going back on 50 mg dose.   Current Meds  Medication Sig  . aspirin 81 MG tablet Take 81 mg by mouth daily.   [name] atorvastatin (LIPITOR) 40 MG tablet Take 1 tablet (40 mg total) by mouth daily.  [name] CALCIUM PO Take by mouth 2 (two) times daily.  . cephALEXin (KEFLEX) 500 MG capsule Take 1 capsule (500 mg total) by mouth 4 (four) times daily.  . cyclobenzaprine (FLEXERIL) 5 MG tablet Take 1 tablet (5 mg total) by mouth at bedtime as needed and may repeat dose one time if needed for muscle spasms.  [name] diltiazem (CARDIZEM CD) 180 MG 24 hr capsule Take 1 capsule (180 mg total) daily by mouth.  . estradiol (ESTRACE) 0.1 MG/GM vaginal cream Apply small amount externally three times weekly as directed  . fluconazole (DIFLUCAN) 150 MG tablet 1 tab po x 1, repeat in 72 hours  . hydrochlorothiazide (MICROZIDE) 12.5 MG capsule Take 1 capsule (12.5 mg total) by mouth daily.  . metoprolol succinate (TOPROL-XL) 50 MG 24 hr tablet Take 1 tablet (50 mg total) by mouth daily.  . Multiple Vitamins-Minerals (EYE VITAMINS PO) Take 1 capsule by mouth 2 (two) times daily. "Eye caps"  . Omega-3 Fatty Acids (FISH OIL PO) Take 1 capsule by mouth daily.   [name] SYNTHROID 137 MCG tablet TAKE 1 TABLET DAILY  . triamcinolone ointment (KENALOG) 0.5 % Apply 1 application topically 2 (two) times daily. Can use for up to 7 days with flare.  . valACYclovir (VALTREX) 500 MG tablet Take 1 tablet (500 mg total) by mouth 2 (two) times daily. 1  tablet po QD.   Allergies  Allergen Reactions  . Neomycin     REACTION: Inflammation  . Ace Inhibitors Cough   Past Medical History:  Diagnosis Date  . Arthritis    knees, back  . Carcinoid tumor of lung 2012   right found incidentally on chest xray eval for r/o vasxculitis   . Coronary artery calcification 07/23/2016  . Double vision    history, no current problem  . Dysrhythmia    irregular occassional, no current problem  . Endometrial cells on cervical Pap smear inconsistent w/LMP 6/11   benign proliferative endometrium on endometrial biopsy  . HNP (herniated nucleus pulposus), cervical  01/22/2011   C6-C7  Has seen dr Vertell Limber for this and Dr Tonita Cong  Positional numbness in hands no weakness    . Hypertension   . Hypothyroidism   . 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 Obstructive sleep apnea 07/27/2014   NPSG 07/2014:  AHI 85/hr, optimal cpap 10cm.  Download 08/2014:  Good compliance, breakthru apnea on 10cm.  Changed to auto 5-12cm >> good control of AHI on f/u download ONO on CPAP:      . Sleep apnea    uses CPAP nightly  . SUI (stress urinary incontinence, female)    h/o   Family History  Problem Relation Age of Onset  . Stroke Mother        died age 28  . Hypertension Mother   . Coronary artery disease Father        died age 34  . Hypertension Father   . Parkinson's disease Brother   . Diabetes Brother   . Hypertension Brother   . Hypertension Maternal Uncle   . Colon cancer Neg Hx   . Rectal cancer Neg Hx   . Stomach cancer Neg Hx    Past Surgical History:  Procedure Laterality Date  . CESAREAN SECTION     x 1 - twins  . COLONOSCOPY  07/31/05   brodie - polyps and bx's  . DILATATION & CURRETTAGE/HYSTEROSCOPY WITH RESECTOCOPE N/A 07/18/2013   Procedure: DILATATION & CURETTAGE/HYSTEROSCOPY WITH RESECTOCOPE;  Surgeon: Lyman Speller, MD;  Location: [facility];  Service: Gynecology;  Laterality: N/A;  mass resection  . Lung tumor removed     Rt lung carcinoid  . MOUTH SURGERY     pre-cancerous ulcer removed  . SHOULDER OPEN ROTATOR CUFF REPAIR  07/20/2010   Right  . WISDOM TOOTH EXTRACTION     Social History   Socioeconomic History  . Marital status: Married    Spouse name: Not on file  . Number of children: 3  . Years of education: Not on file  . Highest education level: Not on file  Social Needs  . Financial resource strain: Not on file  . Food insecurity - worry: Not on file  . Food insecurity - inability: Not on file  . Transportation needs - medical: Not on file  . Transportation needs - non-medical: Not on file  Occupational History    . Occupation: retied    Fish farm manager: [employer]  Tobacco Use  . Smoking status: Never Smoker  . Smokeless tobacco: Never Used  Substance and Sexual Activity  . Alcohol use: Yes    Alcohol/week: 1.2 oz    Types: 2 Glasses of wine per week  . Drug use: No  . Sexual activity: Yes    Partners: Male    Birth control/protection: None, Post-menopausal  Other Topics Concern  . Not on  file  Social History Narrative   hhof 2    2 Children at college and  beyond      [name]    Married    Retired age 2 2015     Review of Systems: General: negative for chills, fever, night sweats or weight changes.  Cardiovascular: negative for chest pain, dyspnea on exertion, edema, orthopnea, palpitations, paroxysmal nocturnal dyspnea or shortness of breath Dermatological: negative for rash Respiratory: negative for cough or wheezing Urologic: negative for hematuria Abdominal: negative for nausea, vomiting, diarrhea, bright red blood per rectum, melena, or hematemesis Neurologic: negative for visual changes, syncope, or dizziness All other systems reviewed and are otherwise negative except as noted above.   Physical Exam:  Height 5' (1.524 m), weight 228 lb (103.4 kg), last menstrual period 02/12/2011.  General appearance: alert, cooperative and no distress, obese  Neck: no carotid bruit and no JVD Lungs: clear to auscultation bilaterally Heart: regular rate and rhythm, S1, S2 normal, no murmur, click, rub or gallop Extremities: extremities normal, atraumatic, no cyanosis or edema Pulses: 2+ and symmetric Skin: Skin color, texture, turgor normal. No rashes or lesions Neurologic: Grossly normal  EKG not performed -- personally reviewed   ASSESSMENT AND PLAN:   1. Symptomatic PACs: frequent PACs noted on recent monitor. No Afib nor flutter detected. Cardizem 180 mg was added to metoprolol. However still w/ mild symptoms. Resting HR is 78 bpm and BP is 138/86. We will  plan to increase metoprolol back up to 50 mg daily. We also discussed laboratory testing to check electrolytes and TSH. Pt would like to see if increase in metoprolol will help. If no improvement, she will plan to f/u with PCP for lab work. She was encouraged to continue full nightly compliance with CPAP, as non compliance can lead to worsening atrial arrhthymias. We also discussed staying well hydrated and reducing caffeine intake. She will call if her symptoms fail to improve or worsen. Otherwise, she will plan to return to see Dr. Radford Pax for her annual visit 08/2018.    Damontay Alred Ladoris Gene, MHS [facility] 10/29/2017 8:38 AM

## 2017-10-30 ENCOUNTER — Telehealth: Payer: Self-pay

## 2017-10-30 ENCOUNTER — Encounter: Payer: Self-pay | Admitting: Obstetrics & Gynecology

## 2017-10-30 NOTE — Telephone Encounter (Signed)
-----   Message from Megan Salon, MD sent at 10/30/2017 12:44 AM EST ----- Please let Nichole Smith know that her testing for BV/yeast was negative but the urine culture did show E coli.  I treated her with keflex and this should work.  She should have a repeat urine culture in one week due to atypical symptoms she's been having.  Order has been placed for urine culture.

## 2017-10-30 NOTE — Telephone Encounter (Signed)
Left message to call Kaitlyn at [phone]. 

## 2017-10-30 NOTE — Telephone Encounter (Signed)
Patient returned call and left a message on our voice mail at lunch. She said it is okay to leave a message if she is not reached at call back.

## 2017-10-30 NOTE — Telephone Encounter (Signed)
Spoke with patient. Advised of message as seen below from [place]. Patient verbalizes understanding. Nurse visit scheduled for 11/05/2017 at 2 pm. Patient Is agreeable to date and time. Encounter closed.

## 2017-11-02 ENCOUNTER — Other Ambulatory Visit: Payer: Self-pay | Admitting: Internal Medicine

## 2017-11-05 ENCOUNTER — Ambulatory Visit (INDEPENDENT_AMBULATORY_CARE_PROVIDER_SITE_OTHER)

## 2017-11-05 ENCOUNTER — Other Ambulatory Visit: Payer: Self-pay

## 2017-11-05 DIAGNOSIS — N3090 Cystitis, unspecified without hematuria: Secondary | ICD-10-CM | POA: Diagnosis not present

## 2017-11-05 NOTE — Progress Notes (Signed)
Patient in office for a repeat urine culture. Patient states that she is feeling better and has completed the course of Keflex. Clean-catch urine culture has been sent to the lab to be resulted. Routing to provider for final review. Patient agreeable to disposition. Will close encounter.

## 2017-11-06 LAB — URINE CULTURE

## 2017-11-18 ENCOUNTER — Encounter: Payer: Self-pay | Admitting: Obstetrics & Gynecology

## 2017-11-18 ENCOUNTER — Telehealth: Payer: Self-pay | Admitting: Obstetrics & Gynecology

## 2017-11-18 NOTE — Telephone Encounter (Signed)
Patient had a positive urine culture for E coli on 10/27/2017. Treated with Keflex. On 11/05/2017 repeat urine culture was negative. Patient reports vaginal itching and irritation started 2 days ago. This AM she began to having increased itching and urinary urgency. Started taking AZO with relief. Advised will need to be seen in the office for further evaluation. Patient is agreeable. Aware Dr.Miller is out of the office today and wants to wait to see her tomorrow. Will continue taking AZO and if symptoms worsen will be seen for evaluation today. Appointment scheduled for 11/19/2017 at 9:45 am with Dr.Miller. Patient is agreeable to date and time. Advised if Dr.Miller has any additional recommendations will return call.  Routing to provider for final review. Patient agreeable to disposition. Will close encounter.

## 2017-11-18 NOTE — Telephone Encounter (Signed)
-----   Message from [place], Generic sent at 11/18/2017 7:40 AM EST -----    Help! It's back! The cephalexin worked wonders. Pain and vaginal itching/irritation were better in about 24 hours and went completely away as I finished the medication. A little itching/irritation started two days ago, but it was just my usual stuff. At 4:30 this morning, that all changed: urgency to pee, lots of itching. It was impossible to sleep until the Azo kicked in.     If you just want to call in a new prescription to get me through the holidays, that would be great. I see you again on December 27. If you want to see me, I am tied up this morning taking someone to the doctor. I have plans this afternoon with my grandson but would change them if you need to see me. I would be available after 11:00.

## 2017-11-19 ENCOUNTER — Ambulatory Visit (INDEPENDENT_AMBULATORY_CARE_PROVIDER_SITE_OTHER): Admitting: Obstetrics & Gynecology

## 2017-11-19 ENCOUNTER — Other Ambulatory Visit: Payer: Self-pay

## 2017-11-19 VITALS — BP 148/72 | HR 60 | Temp 97.8°F | Resp 18 | Ht 60.0 in | Wt 230.0 lb

## 2017-11-19 DIAGNOSIS — R300 Dysuria: Secondary | ICD-10-CM

## 2017-11-19 DIAGNOSIS — L292 Pruritus vulvae: Secondary | ICD-10-CM | POA: Diagnosis not present

## 2017-11-19 LAB — POCT URINALYSIS DIPSTICK
Bilirubin, UA: NEGATIVE
Glucose, UA: NEGATIVE
KETONES UA: NEGATIVE
Leukocytes, UA: NEGATIVE
NITRITE UA: POSITIVE
Protein, UA: NEGATIVE
RBC UA: NEGATIVE
Urobilinogen, UA: 0.2 E.U./dL
pH, UA: 7 (ref 5.0–8.0)

## 2017-11-19 MED ORDER — SULFAMETHOXAZOLE-TRIMETHOPRIM 800-160 MG PO TABS: 1.0000 | ORAL_TABLET | Freq: Two times a day (BID) | ORAL | 0 refills | Status: DC

## 2017-11-19 NOTE — Progress Notes (Signed)
GYNECOLOGY  VISIT  CC:   Urinary urgency  HPI: 61 y.o. G3P3 Married Caucasian female here for UTI symptoms x 2 days.  Woke up yesterday morning with pressure and some vaginal pain.  She is having increased issues with UTIs over the last few months.  Reports she is "not taking care" of herself the last several months.  Having to have some surgery in 2019 and committed to being in better shape in the upcoming year.  Continues to have vulvar itching.  Reports symptoms were gone for a few days while on the prior antibiotic but now symptoms are back.  Denies vaginal discharge or bleeding.  GYNECOLOGIC HISTORY: Patient's last menstrual period was 02/12/2011. Contraception: post menopausal  Menopausal hormone therapy: estrace vag cream   Patient Active Problem List   Diagnosis Date Noted  . Hyperlipidemia LDL goal <70 08/26/2017  . Heart palpitations 08/26/2017  . Hammertoe of right foot 01/06/2017  . Coronary artery calcification 07/23/2016  . Periodic limb movements of sleep 06/18/2016  . Atherosclerotic plaque 06/11/2016  . Piriformis syndrome of left side 10/24/2015  . Loss of transverse plantar arch of left foot 09/12/2015  . 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 Obstructive sleep apnea 07/27/2014  . Midline low back pain without sciatica 06/20/2014  . Polycythemia, secondary 03/22/2014  . Severe obesity (BMI >= 40) ([place]) 02/22/2014  . Elevated hemoglobin ([place]) 02/22/2014  . Left shoulder pain 02/04/2013  . 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 HNP (herniated nucleus pulposus), cervical 01/22/2011  . Visit for preventive health examination 01/22/2011  . PAIN IN JOINT, MULTIPLE SITES 01/21/2010  . Hypothyroidism 10/16/2008  . PALPITATIONS, RECURRENT 01/07/2008  . Essential hypertension 06/25/2007  . OSTEOARTHRITIS 06/25/2007    Past Medical History:  Diagnosis Date  . Arthritis    knees, back  . Carcinoid tumor of lung 2012   right found incidentally on chest xray eval for  r/o vasxculitis   . Coronary artery calcification 07/23/2016  . Double vision    history, no current problem  . Dysrhythmia    irregular occassional, no current problem  . HNP (herniated nucleus pulposus), cervical 01/22/2011   C6-C7  Has seen dr Vertell Limber for this and Dr Tonita Cong  Positional numbness in hands no weakness    . Hypertension   . Hypothyroidism   . 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 Obstructive sleep apnea 07/27/2014   NPSG 07/2014:  AHI 85/hr, optimal cpap 10cm.  Download 08/2014:  Good compliance, breakthru apnea on 10cm.  Changed to auto 5-12cm >> good control of AHI on f/u download ONO on CPAP:      . SUI (stress urinary incontinence, female)    h/o    Past Surgical History:  Procedure Laterality Date  . CESAREAN SECTION     x 1 - twins  . DILATATION & CURRETTAGE/HYSTEROSCOPY WITH RESECTOCOPE N/A 07/18/2013   Procedure: DILATATION & CURETTAGE/HYSTEROSCOPY WITH RESECTOCOPE;  Surgeon: Lyman Speller, MD;  Location: [facility];  Service: Gynecology;  Laterality: N/A;  mass resection  . Lung tumor removed     Rt lung carcinoid  . MOUTH SURGERY     pre-cancerous ulcer removed  . SHOULDER OPEN ROTATOR CUFF REPAIR  07/20/2010   Right  . WISDOM TOOTH EXTRACTION      MEDS:   Current Outpatient Medications on File Prior to Visit  Medication Sig Dispense Refill  . aspirin 81 MG tablet Take 81 mg by mouth daily.    [name] atorvastatin (LIPITOR) 40 MG tablet  Take 1 tablet (40 mg total) by mouth daily. 90 tablet 3  . CALCIUM PO Take by mouth 2 (two) times daily.    . cyclobenzaprine (FLEXERIL) 5 MG tablet Take 1 tablet (5 mg total) by mouth at bedtime as needed and may repeat dose one time if needed for muscle spasms. 30 tablet 1  . diltiazem (CARDIZEM CD) 180 MG 24 hr capsule Take 1 capsule (180 mg total) daily by mouth. 90 capsule 3  . estradiol (ESTRACE) 0.1 MG/GM vaginal cream Apply small amount externally three times weekly as directed 42.5 g 1  . hydrochlorothiazide  (MICROZIDE) 12.5 MG capsule Take 1 capsule (12.5 mg total) by mouth daily. 90 capsule 2  . metoprolol succinate (TOPROL-XL) 50 MG 24 hr tablet Take 1 tablet (50 mg total) by mouth daily. 90 tablet 3  . Multiple Vitamins-Minerals (EYE VITAMINS PO) Take 1 capsule by mouth 2 (two) times daily. "Eye caps"    . SYNTHROID 137 MCG tablet TAKE 1 TABLET DAILY 90 tablet 0  . triamcinolone ointment (KENALOG) 0.5 % Apply 1 application topically 2 (two) times daily. Can use for up to 7 days with flare. 60 g 3  . valACYclovir (VALTREX) 500 MG tablet Take 1 tablet (500 mg total) by mouth 2 (two) times daily. 1 tablet po QD. 6 tablet 0   No current facility-administered medications on file prior to visit.     ALLERGIES: Neomycin and Ace inhibitors  Family History  Problem Relation Age of Onset  . Stroke Mother        died age 57  . Hypertension Mother   . Coronary artery disease Father        died age 79  . Hypertension Father   . Parkinson's disease Brother   . Diabetes Brother   . Hypertension Brother   . Hypertension Maternal Uncle   . Colon cancer Neg Hx   . Rectal cancer Neg Hx   . Stomach cancer Neg Hx     SH:  Married, non smoker  Review of Systems  Genitourinary: Positive for dysuria and frequency.  All other systems reviewed and are negative.   PHYSICAL EXAMINATION:    BP (!) 148/72 (BP Location: Right Arm, Patient Position: Sitting, Cuff Size: Large)   Pulse 60   Temp 97.8 F (36.6 C) (Oral)   Resp 18   Ht 5' (1.524 m)   Wt 230 lb (104.3 kg)   LMP 02/12/2011   BMI 44.92 kg/m     General appearance: alert, cooperative and appears stated age Abdomen: soft, non-tender; bowel sounds normal; no masses,  no organomegaly Lymph:  NAEFG  Pelvic: External genitalia:  Erythematous, almost denuded appearg lesion              Urethra:  normal appearing urethra with no masses, tenderness or lesions              Bartholins and Skenes: normal                 Vagina: normal appearing  vagina with normal color and discharge, no lesions              Cervix: no lesions              Bimanual Exam:  Uterus:  normal size, contour, position, consistency, mobility, non-tender              Adnexa: no mass, fullness, tenderness  Anus:  normal sphincter tone, no lesions  Vulvar biopsy recommended due to chronic itching.  Procedure:  Skin cleansed with Betadine x 3.  3cc 1% Lidocain instilled.  Lot:  [id number].  Exp 01/22/17.  55mm punch biposy obtained and silver nitrate used for excellent hemostasis.  Pt tolerated procedure well.  Chaperone was present for exam.  Assessment: Urinary pressure Chronic vulvar itching  Plan: Bactrim DS BID x 5 days Urine culture pending.  Results will be called to pt.   Vulvar biopsy results will be called as well and recommendations for treatment will be made at that time.

## 2017-11-20 ENCOUNTER — Encounter: Payer: Self-pay | Admitting: Obstetrics & Gynecology

## 2017-11-26 ENCOUNTER — Other Ambulatory Visit: Payer: Self-pay | Admitting: Neurosurgery

## 2017-11-26 ENCOUNTER — Ambulatory Visit: Admitting: Obstetrics & Gynecology

## 2017-11-26 LAB — URINE CULTURE

## 2017-11-27 ENCOUNTER — Telehealth: Payer: Self-pay

## 2017-11-27 DIAGNOSIS — R300 Dysuria: Secondary | ICD-10-CM

## 2017-11-27 NOTE — Telephone Encounter (Signed)
Spoke with patient. Patient is agreeable to return to the office for repeat urine culture on 12/02/2017 due to lab error that occurred outside of our office. Future orders placed. Aware no co-pay for this appointment and this is noted on her appointment.  Routing to provider for final review. Patient agreeable to disposition. Will close encounter.

## 2017-12-02 ENCOUNTER — Other Ambulatory Visit: Payer: Self-pay

## 2017-12-02 ENCOUNTER — Ambulatory Visit (INDEPENDENT_AMBULATORY_CARE_PROVIDER_SITE_OTHER): Admitting: *Deleted

## 2017-12-02 DIAGNOSIS — R300 Dysuria: Secondary | ICD-10-CM

## 2017-12-02 NOTE — Progress Notes (Signed)
Patient here today for UC due to lab error with UC collected at last appointment on 11/19/17. Patient states she completed course of bactrim and symptoms have resolved.   Routing to provider for final review. Patient agreeable to disposition. Will close encounter.

## 2017-12-03 LAB — URINE CULTURE

## 2017-12-04 MED ORDER — DILTIAZEM HCL ER COATED BEADS 180 MG PO CP24: 180.0000 mg | ORAL_CAPSULE | Freq: Every day | ORAL | 3 refills | Status: DC

## 2017-12-04 NOTE — Addendum Note (Signed)
Addended by: Derl Barrow on: 12/04/2017 03:53 PM   Modules accepted: Orders

## 2017-12-10 NOTE — H&P (Signed)
Patient ID:   [id number] Patient: Nichole Smith  Date of Birth: [birth year] Visit Type: Office Visit   Date: 11/12/2017 12:15 PM Provider: Marchia Meiers. Vertell Limber MD   This 62 year old female presents for pain.   History of Present Illness: 1.  pain  Patient returns to review her EMG/NCS and MRI  The MRI demonstrates significant disc herniation at C5-6 level as well as  the C6-7 level.  The EMG and nerve conduction velocity study demonstrates severe bilateral median neuropathies  at the wrist with severe median sensory axonal loss with no responses obtainable as well as severe motor demyelination  .   Based on the results of these imaging   and electrodiagnostic studies , I have recommended that the patient undergo bilateral carpal tunnel release and anterior cervical decompression and fusion  at the C5-6  and C6-7 levels.  She wishes to have her right wrist treated 1st so the plan will be to perform anterior cervical decompression and fusion at C5-6 and C6-7 with right carpal tunnel release and then perform staged left carpal tunnel release after that.         Medical/Surgical/Interim History Reviewed, no change.     PAST MEDICAL HISTORY, SURGICAL HISTORY, FAMILY HISTORY, SOCIAL HISTORY AND REVIEW OF SYSTEMS I have reviewed the patient's past medical, surgical, family and social history as well as the comprehensive review of systems as included on the [facility] history form dated 11/04/2017, which I have signed.  Family History: Reviewed, no changes.    Social History: Reviewed, no changes.   MEDICATIONS(added, continued or stopped this visit): Started Medication Directions Instruction Stopped   Aspirin Low Dose 81 mg tablet,delayed release take 1 tablet by oral route  every day     atorvastatin 40 mg tablet take 1 tablet by oral route  every day     Cartia XT 180 mg capsule,extended release take 1 capsule by oral route  every day     fish oil   ORAL      hydrochlorothiazide 12.5 mg tablet take 1 tablet by oral route  every day     metoprolol succinate ER 50 mg tablet,extended release 24 hr take 1 tablet by oral route  every day     Synthroid 137 mcg tablet take 1 tablet by oral route  every day       ALLERGIES: Ingredient Reaction Medication Name Comment  NO KNOWN ALLERGIES     No known allergies. Reviewed, no changes.    Vitals Date Temp F BP Pulse Ht In Wt Lb BMI BSA Pain Score  11/12/2017  150/84 65 60 231.8 45.27  0/10      IMPRESSION  Herniated cervical discs with cervical radiculopathy, C5-6, C6-7 and severe bilateral carpal tunnel syndrome  Completed Orders (this encounter) Order Details Reason Side Interpretation Result Initial Treatment Date Region  Hypertension education Patient to follow up with primary care provider.        Dietary management education, guidance, and counseling patient encouraged to eat a well balanced diet         Assessment/Plan # Detail Type Description   1. Assessment Herniated nucleus pulposus, C5-6 (M50.222).       2. Assessment Herniated nucleus pulposus, C6-7 (M50.223).       3. Assessment Spinal stenosis, cervical region (M48.02).       4. Assessment Carpal tunnel syndrome on both sides (G56.03).       5. Assessment Cervical radiculopathy (M54.12).  Plan Orders Soft Collar Regular [phone]).       6. Assessment Essential (primary) hypertension (I10).       7. Assessment Body mass index (BMI) 45.0-49.9, adult (Z68.42).   Plan Orders Today's instructions / counseling include(s) Dietary management education, guidance, and counseling.           Pain Management Plan Pain Scale: 0/10. Method: Numeric Pain Intensity Scale. Onset: 06/18/2017.  Proceed with anterior cervical decompression and fusion C5-6 and C6-7 levels with right carpal tunnel release at the same time and subsequently left carpal tunnel release in the postoperative period.  Orders: Diagnostic  Procedures: Assessment Procedure  M54.12 Cervical Spine- Lateral  Instruction(s)/Education: Assessment Instruction  I10 Hypertension education  [phone] Dietary management education, guidance, and counseling  Miscellaneous: Assessment   M54.12 Soft Collar Regular ([id number])             Provider:  Vertell Limber MD, Marchia Meiers 11/13/2017 6:19 PM  Dictation edited by: Marchia Meiers. Vertell Limber    CC Providers: [place],  [place]  [id number]-   Wanda Panosh  [place] [address] [place],  [address]-              Electronically signed by Marchia Meiers. Vertell Limber MD on 11/13/2017 06:19 PM       Patient ID:   [id number] Patient: Nichole Smith  Date of Birth: March 17, 1956 Visit Type: Office Visit   Date: 10/19/2017 01:00 PM Provider: Marchia Meiers. Vertell Limber MD   This 62 year old female presents for neck pain.   History of Present Illness: 1.  neck pain  Nichole Smith, 62 year old female, returns for evaluation reporting numbness and tingling in both upper extremities, left arm pain often waking the patient at night. She notes this has been occurring for four months and the numbness has slightly resolved in the past few weeks. She notes hand position can aggravate hand numbness. She was seen in 2002 for similar symptoms.  She notes flare ups are usually controlled by traction and physical therapy.  Prednisone course helped only for short period Physical therapy no longer offers relief  History:  Hypothyroidism, CAD, HTN, sleep apnea (CPAP) Surgical history:  Right rotator cuff 2011, right lobectomy for carcinoid tumor 2012, D&C 2014  MRI and x-ray on [place]           MEDICATIONS(added, continued or stopped this visit):   ALLERGIES:   Review of Systems System Neg/Pos Details  Constitutional Negative Chills, Fatigue, Fever, Malaise, Night sweats, Weight gain and Weight loss.  ENMT Negative Ear drainage,  Hearing loss, Nasal drainage, Otalgia, Sinus pressure and Sore throat.  Eyes Negative Eye discharge, Eye pain and Vision changes.  Respiratory Negative Chronic cough, Cough, Dyspnea, Known TB exposure and Wheezing.  Cardio Negative Chest pain, Claudication, Edema and Irregular heartbeat/palpitations.  GI Negative Abdominal pain, Blood in stool, Change in stool pattern, Constipation, Decreased appetite, Diarrhea, Heartburn, Nausea and Vomiting.  GU Negative Dysuria, Hematuria, Polyuria (Genitourinary), Urinary frequency, Urinary incontinence and Urinary retention.  Endocrine Negative Cold intolerance, Heat intolerance, Polydipsia and Polyphagia.  Neuro Positive Numbness in extremity.  Psych Negative Anxiety, Depression and Insomnia.  Integumentary Negative Brittle hair, Brittle nails, Change in shape/size of mole(s), Hair loss, Hirsutism, Hives, Pruritus, Rash and Skin lesion.  MS Negative Back pain, Joint pain, Joint swelling, Muscle weakness and Neck pain.  Hema/Lymph Negative Easy bleeding, Easy bruising and Lymphadenopathy.  Allergic/Immuno Negative Contact allergy, Environmental allergies, Food allergies and Seasonal allergies.  Reproductive Negative Breast discharge, Breast lumps, Dysmenorrhea, Dyspareunia, History of abnormal PAP smear, Hot flashes, Irregular menses and Vaginal discharge.   Vitals Date Temp F BP Pulse Ht In Wt Lb BMI BSA Pain Score  10/19/2017  150/83 72 60 228.4 44.61  2/10     PHYSICAL EXAM General Level of Distress: no acute distress Overall Appearance: normal  Head and Face  Right Left  Fundoscopic Exam:  normal normal    Cardiovascular Cardiac: regular rate and rhythm without murmur  Right Left  Carotid Pulses: normal normal  Respiratory Lungs: clear to auscultation  Neurological Orientation: normal Recent and Remote Memory: normal Attention Span and Concentration:   normal Language: normal Fund of  Knowledge: normal  Right Left Sensation: normal normal Upper Extremity Coordination: normal normal  Lower Extremity Coordination: normal normal  Musculoskeletal Gait and Station: normal  Right Left Upper Extremity Muscle Strength: normal normal Lower Extremity Muscle Strength: normal normal Upper Extremity Muscle Tone:  normal normal Lower Extremity Muscle Tone: normal normal   Motor Strength Upper and lower extremity motor strength was tested in the clinically pertinent muscles.     Deep Tendon Reflexes  Right Left Biceps: normal normal Triceps: normal normal Brachioradialis: normal normal Patellar: normal normal Achilles: normal normal  Cranial Nerves II. Optic Nerve/Visual Fields: normal III. Oculomotor: normal IV. Trochlear: normal V. Trigeminal: normal VI. Abducens: normal VII. Facial: normal VIII. Acoustic/Vestibular: normal IX. Glossopharyngeal: normal X. Vagus: normal XI. Spinal Accessory: normal XII. Hypoglossal: normal  Motor and other Tests Lhermittes: negative Rhomberg: negative Pronator drift: absent     Right Left Spurlings negative positive Hoffman's: normal normal Clonus: normal normal Babinski: normal normal Tinels Wrist: negative negative Phalen: positive positive   Additional Findings:  Negative Lhermittes. Positive Spurling on the left. Negative Spurling on the right. Negative Tinel's on the right wrist. Full strength in upper extremities. Negative Tinel's on the left wrist. Full strength in lower extremities. Positive Phalen's bilaterally. Hyperreflexic at the knees. Negative Hoffman's bilaterally. Symmetric pin sensation in the hands.   DIAGNOSTIC RESULTS MRI 10/13/17: Multilevel spondylosis with relatively mild stenosis, of a multifactorial nature, at C3-4, C4-5, C5-6, and C7-T1. Any or all of these levels could contribute to radicular symptoms due to varying degrees of foraminal narrowing. Large disc extrusion at C6-7 central to  the right with cephalad migrated free fragment. Severe stenosis with right greater than left cord compression as well as bilateral foraminal narrowing, also worse on the right. Surgical consultation is warranted as this degree of cord compression could lead to myelopathy, if untreated.    IMPRESSION Patient presents with upper extremity numbness and left arm pain, worst pain is in the hands. MRI shows spinal stenosis worst at C6-7, and to a lesser degree C5-6. Arthritis and degeneration noted at C3-4 and C4-5. On confrontational testing, positive Spurling to the left, positive Phalen's bilaterally, and hyperreflexia at the knees. Schedule bilateral upper extremity nerve conduction test/ EMG. Fit for wrist splints. Anticipate C5-6 C6-7 ACDF.   Completed Orders (this encounter) Order Details Reason Side Interpretation Result Initial Treatment Date Region  Cervical Spine- AP/Lat/Flex/Ex W/ SWIMMERS     10/19/2017 All Levels to All Levels   Assessment/Plan # Detail Type Description   1. Assessment Cervical radiculopathy (M54.12).           Pain Management Plan Pain Scale: 2/10. Method: Numeric Pain Intensity Scale. Location: arms and hands. Onset: 06/18/2017. Duration: varies. Quality: discomforting. Pain management follow-up plan of care: Patient is taking OTC pain relievers  for relief..  Fit for bilateral wrist splints. Schedule bilateral upper extremity nerve conduction test/ EMG. Follow-up after EMG.  Orders: Diagnostic Procedures: Assessment Procedure  M54.12 Cervical Spine- AP/Lat/Flex/Ex             Provider:  Vertell Limber MD, Marchia Meiers 10/19/2017 3:04 PM  Dictation edited by: Lucita Lora    CC Providers: [place] [address] [place],  [place]  [address]-   Wanda Panosh  [place] [address] [place], [place] [address]-              Electronically signed by Marchia Meiers. Vertell Limber MD on 10/25/2017 01:47 PM

## 2017-12-14 ENCOUNTER — Encounter: Payer: Self-pay | Admitting: Obstetrics & Gynecology

## 2017-12-14 ENCOUNTER — Telehealth: Payer: Self-pay | Admitting: Obstetrics & Gynecology

## 2017-12-14 NOTE — Telephone Encounter (Signed)
Routing to [place] as Juluis Rainier. I do not see a refill request in the Epic at this time.  Routing to provider for final review. Patient agreeable to disposition. Will close encounter.

## 2017-12-14 NOTE — Telephone Encounter (Signed)
-----   Message from [facility], Generic sent at 12/14/2017 1:01 PM EST -----    You may have received a refill request for my estrogen ointment from [facility]. I don't need it. I accidently checked the wrong medication when getting refills online. Apparently I can't cancel it. So, if you've already authorized, that's fine. Otherwise please disregard. Thank you

## 2017-12-18 NOTE — Pre-Procedure Instructions (Signed)
Nichole Smith  12/18/2017    Your procedure is scheduled on Thursday, December 24, 2017 at 2:00 PM.   Report to [facility] Entrance "A" Admitting Office at 12 noon.   Call this number if you have problems the morning of surgery: [phone]   Questions prior to day of surgery, please call [phone] between 8 & 4 PM.   Remember:  Do not eat food or drink liquids after midnight Wednesday, 12/23/17.  Take these medicines the morning of surgery with A SIP OF WATER: Metoprolol (Toprol XL)  Stop Aspirin as directed by your surgeon. Stop Multivitamins and NSAIDS (Ibuprofen, Aleve, etc) as of today.   Do not wear jewelry, make-up or nail polish.  Do not wear lotions, powders, perfumes or deodorant.  Do not shave 48 hours prior to surgery.   Do not bring valuables to the hospital.  [facility] is not responsible for any belongings or valuables.  Contacts, dentures or bridgework may not be worn into surgery.  Leave your suitcase in the car.  After surgery it may be brought to your room.  For patients admitted to the hospital, discharge time will be determined by your treatment team.  Patients discharged the day of surgery will not be allowed to drive home.   [place] - Preparing for Surgery  Before surgery, you can play an important role.  Because skin is not sterile, your skin needs to be as free of germs as possible.  You can reduce the number of germs on you skin by washing with CHG (chlorahexidine gluconate) soap before surgery.  CHG is an antiseptic cleaner which kills germs and bonds with the skin to continue killing germs even after washing.  Please DO NOT use if you have an allergy to CHG or antibacterial soaps.  If your skin becomes reddened/irritated stop using the CHG and inform your nurse when you arrive at Short Stay.  Do not shave (including legs and underarms) for at least 48 hours prior to the first CHG shower.  You may shave your face.  Please follow  these instructions carefully:   1.  Shower with CHG Soap the night before surgery and the                    morning of Surgery.  2.  If you choose to wash your hair, wash your hair first as usual with your       normal shampoo.  3.  After you shampoo, rinse your hair and body thoroughly to remove the shampoo.  4.  Use CHG as you would any other liquid soap.  You can apply chg directly       to the skin and wash gently with scrungie or a clean washcloth.  5.  Apply the CHG Soap to your body ONLY FROM THE NECK DOWN.        Do not use on open wounds or open sores.  Avoid contact with your eyes, ears, mouth and genitals (private parts).  Wash genitals (private parts) with your normal soap.  6.  Wash thoroughly, paying special attention to the area where your surgery        will be performed.  7.  Thoroughly rinse your body with warm water from the neck down.  8.  DO NOT shower/wash with your normal soap after using and rinsing off       the CHG Soap.  9.  Pat yourself dry with a clean towel.  10.  Wear clean pajamas.            11.  Place clean sheets on your bed the night of your first shower and do not        sleep with pets.  Day of Surgery  Shower as above. Do not apply any lotions/deodorants the morning of surgery.  Please wear clean clothes to the hospital=.   Please read over the fact sheets that you were given.

## 2017-12-21 ENCOUNTER — Telehealth: Payer: Self-pay | Admitting: *Deleted

## 2017-12-21 ENCOUNTER — Other Ambulatory Visit: Payer: Self-pay | Admitting: Neurosurgery

## 2017-12-21 ENCOUNTER — Encounter (HOSPITAL_COMMUNITY): Payer: Self-pay

## 2017-12-21 ENCOUNTER — Encounter: Payer: Self-pay | Admitting: Obstetrics & Gynecology

## 2017-12-21 ENCOUNTER — Encounter (HOSPITAL_COMMUNITY)
Admission: RE | Admit: 2017-12-21 | Discharge: 2017-12-21 | Disposition: A | Discharge status: Home / Self Care | Source: Ambulatory Visit | Attending: Neurosurgery | Admitting: Neurosurgery

## 2017-12-21 ENCOUNTER — Other Ambulatory Visit: Payer: Self-pay

## 2017-12-21 DIAGNOSIS — I10 Essential (primary) hypertension: Secondary | ICD-10-CM | POA: Diagnosis not present

## 2017-12-21 DIAGNOSIS — Z01818 Encounter for other preprocedural examination: Secondary | ICD-10-CM | POA: Insufficient documentation

## 2017-12-21 DIAGNOSIS — M1990 Unspecified osteoarthritis, unspecified site: Secondary | ICD-10-CM | POA: Diagnosis not present

## 2017-12-21 DIAGNOSIS — Z7982 Long term (current) use of aspirin: Secondary | ICD-10-CM | POA: Diagnosis not present

## 2017-12-21 DIAGNOSIS — Z79899 Other long term (current) drug therapy: Secondary | ICD-10-CM | POA: Diagnosis not present

## 2017-12-21 DIAGNOSIS — M50022 Cervical disc disorder at C5-C6 level with myelopathy: Secondary | ICD-10-CM | POA: Diagnosis not present

## 2017-12-21 DIAGNOSIS — Z883 Allergy status to other anti-infective agents status: Secondary | ICD-10-CM | POA: Diagnosis not present

## 2017-12-21 DIAGNOSIS — M50122 Cervical disc disorder at C5-C6 level with radiculopathy: Secondary | ICD-10-CM | POA: Diagnosis not present

## 2017-12-21 DIAGNOSIS — G4730 Sleep apnea, unspecified: Secondary | ICD-10-CM | POA: Diagnosis not present

## 2017-12-21 DIAGNOSIS — Z888 Allergy status to other drugs, medicaments and biological substances status: Secondary | ICD-10-CM | POA: Diagnosis not present

## 2017-12-21 DIAGNOSIS — Z6841 Body Mass Index (BMI) 40.0 and over, adult: Secondary | ICD-10-CM | POA: Diagnosis not present

## 2017-12-21 DIAGNOSIS — E039 Hypothyroidism, unspecified: Secondary | ICD-10-CM | POA: Diagnosis not present

## 2017-12-21 DIAGNOSIS — M4802 Spinal stenosis, cervical region: Secondary | ICD-10-CM

## 2017-12-21 DIAGNOSIS — M50123 Cervical disc disorder at C6-C7 level with radiculopathy: Secondary | ICD-10-CM | POA: Diagnosis not present

## 2017-12-21 DIAGNOSIS — G5602 Carpal tunnel syndrome, left upper limb: Secondary | ICD-10-CM

## 2017-12-21 DIAGNOSIS — M50023 Cervical disc disorder at C6-C7 level with myelopathy: Secondary | ICD-10-CM | POA: Diagnosis not present

## 2017-12-21 HISTORY — DX: Malignant (primary) neoplasm, unspecified: C80.1

## 2017-12-21 HISTORY — DX: Carpal tunnel syndrome, bilateral upper limbs: G56.03

## 2017-12-21 LAB — TYPE AND SCREEN
ABO/RH(D): A POS
ANTIBODY SCREEN: NEGATIVE

## 2017-12-21 LAB — BASIC METABOLIC PANEL
Anion gap: 12 (ref 5–15)
BUN: 24 mg/dL — AB (ref 6–20)
CALCIUM: 9.4 mg/dL (ref 8.9–10.3)
CHLORIDE: 101 mmol/L (ref 101–111)
CO2: 25 mmol/L (ref 22–32)
CREATININE: 0.67 mg/dL (ref 0.44–1.00)
GFR calc non Af Amer: >60 mL/min (ref >60)
Glucose, Bld: 112 mg/dL — ABNORMAL HIGH (ref 65–99)
Potassium: 3.7 mmol/L (ref 3.5–5.1)
SODIUM: 138 mmol/L (ref 135–145)

## 2017-12-21 LAB — CBC
HCT: 48.9 % — ABNORMAL HIGH (ref 36.0–46.0)
Hemoglobin: 16.6 g/dL — ABNORMAL HIGH (ref 12.0–15.0)
MCH: 31.3 pg (ref 26.0–34.0)
MCHC: 33.9 g/dL (ref 30.0–36.0)
MCV: 92.1 fL (ref 78.0–100.0)
PLATELETS: 246 10*3/uL (ref 150–400)
RBC: 5.31 MIL/uL — ABNORMAL HIGH (ref 3.87–5.11)
RDW: 13.1 % (ref 11.5–15.5)
WBC: 8.6 10*3/uL (ref 4.0–10.5)

## 2017-12-21 LAB — SURGICAL PCR SCREEN
MRSA, PCR: NEGATIVE
Staphylococcus aureus: NEGATIVE

## 2017-12-21 NOTE — Progress Notes (Addendum)
Pt denies cardiac history except for hx of PAC's. Pt on Diltiazem for that, Dr. Radford Pax is her cardiologist. Pt denies being diabetic.

## 2017-12-21 NOTE — Telephone Encounter (Signed)
Follow-up call to patient regarding My Chart message and [facility] questions.  Patient states she has talked to representative with [facility] and questions have been answered. She is comfortable with resolution and appreciative of call.   Routing to provider for final review. Patient agreeable to disposition. Will close encounter.

## 2017-12-21 NOTE — Telephone Encounter (Signed)
Call to [employer] Will have compliance department (that sent letter) contact patient. I will then follow-up with patient directly.   Routing to Dr Sabra Heck for review. See Phone encounter.

## 2017-12-24 ENCOUNTER — Encounter (HOSPITAL_COMMUNITY)
Admission: RE | Disposition: A | Discharge status: Home / Self Care | Payer: Self-pay | Source: Ambulatory Visit | Attending: Neurosurgery

## 2017-12-24 ENCOUNTER — Ambulatory Visit (HOSPITAL_COMMUNITY)

## 2017-12-24 ENCOUNTER — Ambulatory Visit (HOSPITAL_COMMUNITY)
Admission: RE | Admit: 2017-12-24 | Discharge: 2017-12-25 | Disposition: A | Discharge status: Home / Self Care | Source: Ambulatory Visit | Attending: Neurosurgery | Admitting: Neurosurgery

## 2017-12-24 ENCOUNTER — Encounter (HOSPITAL_COMMUNITY): Payer: Self-pay | Admitting: *Deleted

## 2017-12-24 DIAGNOSIS — Z888 Allergy status to other drugs, medicaments and biological substances status: Secondary | ICD-10-CM | POA: Insufficient documentation

## 2017-12-24 DIAGNOSIS — M50023 Cervical disc disorder at C6-C7 level with myelopathy: Secondary | ICD-10-CM | POA: Insufficient documentation

## 2017-12-24 DIAGNOSIS — M1990 Unspecified osteoarthritis, unspecified site: Secondary | ICD-10-CM | POA: Insufficient documentation

## 2017-12-24 DIAGNOSIS — Z883 Allergy status to other anti-infective agents status: Secondary | ICD-10-CM | POA: Insufficient documentation

## 2017-12-24 DIAGNOSIS — M4802 Spinal stenosis, cervical region: Secondary | ICD-10-CM | POA: Diagnosis present

## 2017-12-24 DIAGNOSIS — Z419 Encounter for procedure for purposes other than remedying health state, unspecified: Secondary | ICD-10-CM

## 2017-12-24 DIAGNOSIS — M50123 Cervical disc disorder at C6-C7 level with radiculopathy: Secondary | ICD-10-CM | POA: Insufficient documentation

## 2017-12-24 DIAGNOSIS — M50122 Cervical disc disorder at C5-C6 level with radiculopathy: Secondary | ICD-10-CM | POA: Insufficient documentation

## 2017-12-24 DIAGNOSIS — Z79899 Other long term (current) drug therapy: Secondary | ICD-10-CM | POA: Insufficient documentation

## 2017-12-24 DIAGNOSIS — G5602 Carpal tunnel syndrome, left upper limb: Secondary | ICD-10-CM | POA: Insufficient documentation

## 2017-12-24 DIAGNOSIS — I10 Essential (primary) hypertension: Secondary | ICD-10-CM | POA: Insufficient documentation

## 2017-12-24 DIAGNOSIS — E039 Hypothyroidism, unspecified: Secondary | ICD-10-CM | POA: Insufficient documentation

## 2017-12-24 DIAGNOSIS — Z6841 Body Mass Index (BMI) 40.0 and over, adult: Secondary | ICD-10-CM | POA: Insufficient documentation

## 2017-12-24 DIAGNOSIS — G4730 Sleep apnea, unspecified: Secondary | ICD-10-CM | POA: Insufficient documentation

## 2017-12-24 DIAGNOSIS — M50022 Cervical disc disorder at C5-C6 level with myelopathy: Secondary | ICD-10-CM | POA: Insufficient documentation

## 2017-12-24 DIAGNOSIS — Z7982 Long term (current) use of aspirin: Secondary | ICD-10-CM | POA: Insufficient documentation

## 2017-12-24 HISTORY — PX: CARPAL TUNNEL RELEASE: SHX101

## 2017-12-24 HISTORY — PX: ANTERIOR CERVICAL DECOMP/DISCECTOMY FUSION: SHX1161

## 2017-12-24 SURGERY — ANTERIOR CERVICAL DECOMPRESSION/DISCECTOMY FUSION 2 LEVELS
Anesthesia: General | Site: Spine Cervical | Laterality: Left

## 2017-12-24 MED ORDER — PHENYLEPHRINE 40 MCG/ML (10ML) SYRINGE FOR IV PUSH (FOR BLOOD PRESSURE SUPPORT)
PREFILLED_SYRINGE | INTRAVENOUS | Status: AC
  Filled 2017-12-24: qty 20

## 2017-12-24 MED ORDER — DEXAMETHASONE SODIUM PHOSPHATE 10 MG/ML IJ SOLN
INTRAMUSCULAR | Status: AC
  Filled 2017-12-24: qty 1

## 2017-12-24 MED ORDER — BISACODYL 10 MG RE SUPP: 10.0000 mg | Freq: Every day | RECTAL | Status: DC | PRN

## 2017-12-24 MED ORDER — LIDOCAINE HCL 1 % IJ SOLN
INTRAMUSCULAR | Status: DC | PRN
  Administered 2017-12-24: 5 mL

## 2017-12-24 MED ORDER — LACTATED RINGERS IV SOLN
INTRAVENOUS | Status: DC
  Administered 2017-12-24 (×2): via INTRAVENOUS

## 2017-12-24 MED ORDER — FENTANYL CITRATE (PF) 250 MCG/5ML IJ SOLN
INTRAMUSCULAR | Status: DC | PRN
  Administered 2017-12-24 (×7): 50 ug via INTRAVENOUS

## 2017-12-24 MED ORDER — PRESERVISION AREDS 2 PO CAPS: ORAL_CAPSULE | Freq: Two times a day (BID) | ORAL | Status: DC

## 2017-12-24 MED ORDER — PANTOPRAZOLE SODIUM 40 MG PO TBEC
40.0000 mg | DELAYED_RELEASE_TABLET | Freq: Every day | ORAL | Status: DC
  Administered 2017-12-24: 40 mg via ORAL
  Filled 2017-12-24: qty 1

## 2017-12-24 MED ORDER — ROCURONIUM BROMIDE 10 MG/ML (PF) SYRINGE
PREFILLED_SYRINGE | INTRAVENOUS | Status: DC | PRN
  Administered 2017-12-24: 40 mg via INTRAVENOUS
  Administered 2017-12-24: 60 mg via INTRAVENOUS

## 2017-12-24 MED ORDER — LIDOCAINE 2% (20 MG/ML) 5 ML SYRINGE
INTRAMUSCULAR | Status: DC | PRN
  Administered 2017-12-24: 60 mg via INTRAVENOUS

## 2017-12-24 MED ORDER — LIDOCAINE-EPINEPHRINE 1 %-1:100000 IJ SOLN
INTRAMUSCULAR | Status: AC
  Filled 2017-12-24: qty 1

## 2017-12-24 MED ORDER — OXYCODONE HCL 5 MG PO TABS
5.0000 mg | ORAL_TABLET | ORAL | Status: DC | PRN
  Administered 2017-12-24 – 2017-12-25 (×3): 5 mg via ORAL
  Filled 2017-12-24 (×2): qty 1

## 2017-12-24 MED ORDER — HYDROMORPHONE HCL 1 MG/ML IJ SOLN
INTRAMUSCULAR | Status: AC
  Filled 2017-12-24: qty 1

## 2017-12-24 MED ORDER — CALCIUM CARBONATE-VITAMIN D 500-200 MG-UNIT PO TABS
1.0000 | ORAL_TABLET | Freq: Every day | ORAL | Status: DC
  Filled 2017-12-24: qty 1

## 2017-12-24 MED ORDER — CEFAZOLIN SODIUM-DEXTROSE 2-4 GM/100ML-% IV SOLN
2.0000 g | INTRAVENOUS | Status: AC
  Administered 2017-12-24: 2 g via INTRAVENOUS

## 2017-12-24 MED ORDER — MIDAZOLAM HCL 5 MG/5ML IJ SOLN
INTRAMUSCULAR | Status: DC | PRN
  Administered 2017-12-24: 2 mg via INTRAVENOUS

## 2017-12-24 MED ORDER — SODIUM CHLORIDE 0.9% FLUSH: 3.0000 mL | Freq: Two times a day (BID) | INTRAVENOUS | Status: DC

## 2017-12-24 MED ORDER — METHOCARBAMOL 1000 MG/10ML IJ SOLN
500.0000 mg | Freq: Four times a day (QID) | INTRAVENOUS | Status: DC | PRN
  Filled 2017-12-24: qty 5

## 2017-12-24 MED ORDER — ARTIFICIAL TEARS OPHTHALMIC OINT
TOPICAL_OINTMENT | OPHTHALMIC | Status: AC
  Filled 2017-12-24: qty 3.5

## 2017-12-24 MED ORDER — FENTANYL CITRATE (PF) 250 MCG/5ML IJ SOLN
INTRAMUSCULAR | Status: AC
  Filled 2017-12-24: qty 5

## 2017-12-24 MED ORDER — PANTOPRAZOLE SODIUM 40 MG IV SOLR: 40.0000 mg | Freq: Every day | INTRAVENOUS | Status: DC

## 2017-12-24 MED ORDER — ATORVASTATIN CALCIUM 20 MG PO TABS
40.0000 mg | ORAL_TABLET | Freq: Every day | ORAL | Status: DC
  Administered 2017-12-24: 40 mg via ORAL
  Filled 2017-12-24: qty 2

## 2017-12-24 MED ORDER — HYDROCHLOROTHIAZIDE 12.5 MG PO CAPS: 12.5000 mg | ORAL_CAPSULE | Freq: Every day | ORAL | Status: DC

## 2017-12-24 MED ORDER — BUPIVACAINE HCL (PF) 0.5 % IJ SOLN
INTRAMUSCULAR | Status: DC | PRN
  Administered 2017-12-24: 2.5 mL

## 2017-12-24 MED ORDER — FLEET ENEMA 7-19 GM/118ML RE ENEM: 1.0000 | ENEMA | Freq: Once | RECTAL | Status: DC | PRN

## 2017-12-24 MED ORDER — POLYETHYLENE GLYCOL 3350 17 G PO PACK: 17.0000 g | PACK | Freq: Every day | ORAL | Status: DC | PRN

## 2017-12-24 MED ORDER — ONDANSETRON HCL 4 MG/2ML IJ SOLN
INTRAMUSCULAR | Status: DC | PRN
  Administered 2017-12-24: 4 mg via INTRAVENOUS

## 2017-12-24 MED ORDER — OXYCODONE HCL 5 MG PO TABS
ORAL_TABLET | ORAL | Status: AC
  Filled 2017-12-24: qty 1

## 2017-12-24 MED ORDER — ACETAMINOPHEN 325 MG PO TABS
650.0000 mg | ORAL_TABLET | ORAL | Status: DC | PRN
  Administered 2017-12-24: 650 mg via ORAL
  Filled 2017-12-24: qty 2

## 2017-12-24 MED ORDER — MENTHOL 3 MG MT LOZG: 1.0000 | LOZENGE | OROMUCOSAL | Status: DC | PRN

## 2017-12-24 MED ORDER — HYPROMELLOSE (GONIOSCOPIC) 2.5 % OP SOLN
1.0000 [drp] | Freq: Every day | OPHTHALMIC | Status: DC | PRN
  Filled 2017-12-24: qty 15

## 2017-12-24 MED ORDER — ALUM & MAG HYDROXIDE-SIMETH 200-200-20 MG/5ML PO SUSP: 30.0000 mL | Freq: Four times a day (QID) | ORAL | Status: DC | PRN

## 2017-12-24 MED ORDER — TRIAMCINOLONE ACETONIDE 0.5 % EX OINT
1.0000 "application " | TOPICAL_OINTMENT | Freq: Two times a day (BID) | CUTANEOUS | Status: DC | PRN
  Filled 2017-12-24: qty 15

## 2017-12-24 MED ORDER — HYDROMORPHONE HCL 1 MG/ML IJ SOLN
0.2500 mg | INTRAMUSCULAR | Status: DC | PRN
  Administered 2017-12-24 (×2): 0.5 mg via INTRAVENOUS

## 2017-12-24 MED ORDER — DOCUSATE SODIUM 100 MG PO CAPS
100.0000 mg | ORAL_CAPSULE | Freq: Two times a day (BID) | ORAL | Status: DC
  Administered 2017-12-24: 100 mg via ORAL
  Filled 2017-12-24: qty 1

## 2017-12-24 MED ORDER — ONDANSETRON HCL 4 MG/2ML IJ SOLN: 4.0000 mg | Freq: Four times a day (QID) | INTRAMUSCULAR | Status: DC | PRN

## 2017-12-24 MED ORDER — SODIUM CHLORIDE 0.9 % IV SOLN: 250.0000 mL | INTRAVENOUS | Status: DC

## 2017-12-24 MED ORDER — EPHEDRINE 5 MG/ML INJ
INTRAVENOUS | Status: AC
  Filled 2017-12-24: qty 10

## 2017-12-24 MED ORDER — MIDAZOLAM HCL 2 MG/2ML IJ SOLN
INTRAMUSCULAR | Status: AC
Start: 2017-12-24 — End: 2017-12-24
  Filled 2017-12-24: qty 2

## 2017-12-24 MED ORDER — DEXAMETHASONE SODIUM PHOSPHATE 10 MG/ML IJ SOLN
INTRAMUSCULAR | Status: DC | PRN
  Administered 2017-12-24: 10 mg via INTRAVENOUS

## 2017-12-24 MED ORDER — DILTIAZEM HCL ER COATED BEADS 180 MG PO CP24
180.0000 mg | ORAL_CAPSULE | Freq: Every day | ORAL | Status: DC
  Administered 2017-12-24: 180 mg via ORAL
  Filled 2017-12-24: qty 1

## 2017-12-24 MED ORDER — LIDOCAINE HCL (PF) 1 % IJ SOLN
INTRAMUSCULAR | Status: AC
  Filled 2017-12-24: qty 30

## 2017-12-24 MED ORDER — ROCURONIUM BROMIDE 10 MG/ML (PF) SYRINGE
PREFILLED_SYRINGE | INTRAVENOUS | Status: AC
  Filled 2017-12-24: qty 10

## 2017-12-24 MED ORDER — CHLORHEXIDINE GLUCONATE CLOTH 2 % EX PADS: 6.0000 | MEDICATED_PAD | Freq: Once | CUTANEOUS | Status: DC

## 2017-12-24 MED ORDER — SODIUM CHLORIDE 0.9% FLUSH: 3.0000 mL | INTRAVENOUS | Status: DC | PRN

## 2017-12-24 MED ORDER — PHENOL 1.4 % MT LIQD: 1.0000 | OROMUCOSAL | Status: DC | PRN

## 2017-12-24 MED ORDER — BUPIVACAINE HCL (PF) 0.5 % IJ SOLN
INTRAMUSCULAR | Status: AC
  Filled 2017-12-24: qty 30

## 2017-12-24 MED ORDER — ACETAMINOPHEN 650 MG RE SUPP: 650.0000 mg | RECTAL | Status: DC | PRN

## 2017-12-24 MED ORDER — KCL IN DEXTROSE-NACL 20-5-0.45 MEQ/L-%-% IV SOLN: INTRAVENOUS | Status: DC

## 2017-12-24 MED ORDER — SUGAMMADEX SODIUM 200 MG/2ML IV SOLN
INTRAVENOUS | Status: DC | PRN
  Administered 2017-12-24: 300 mg via INTRAVENOUS
  Administered 2017-12-24: 100 mg via INTRAVENOUS

## 2017-12-24 MED ORDER — PROPOFOL 10 MG/ML IV BOLUS
INTRAVENOUS | Status: DC | PRN
  Administered 2017-12-24: 50 mg via INTRAVENOUS
  Administered 2017-12-24: 150 mg via INTRAVENOUS

## 2017-12-24 MED ORDER — CALCIUM 600-200 MG-UNIT PO TABS: ORAL_TABLET | Freq: Every day | ORAL | Status: DC

## 2017-12-24 MED ORDER — METOPROLOL SUCCINATE ER 50 MG PO TB24: 50.0000 mg | ORAL_TABLET | Freq: Every day | ORAL | Status: DC

## 2017-12-24 MED ORDER — THROMBIN (RECOMBINANT) 5000 UNITS EX SOLR
CUTANEOUS | Status: AC
  Filled 2017-12-24: qty 10000

## 2017-12-24 MED ORDER — GELATIN ABSORBABLE MT POWD
OROMUCOSAL | Status: DC | PRN
  Administered 2017-12-24: 5 mL via TOPICAL

## 2017-12-24 MED ORDER — 0.9 % SODIUM CHLORIDE (POUR BTL) OPTIME
TOPICAL | Status: DC | PRN
  Administered 2017-12-24: 1000 mL

## 2017-12-24 MED ORDER — MORPHINE SULFATE (PF) 4 MG/ML IV SOLN: 2.0000 mg | INTRAVENOUS | Status: DC | PRN

## 2017-12-24 MED ORDER — ONDANSETRON HCL 4 MG PO TABS: 4.0000 mg | ORAL_TABLET | Freq: Four times a day (QID) | ORAL | Status: DC | PRN

## 2017-12-24 MED ORDER — PROPOFOL 10 MG/ML IV BOLUS
INTRAVENOUS | Status: AC
  Filled 2017-12-24: qty 20

## 2017-12-24 MED ORDER — LIDOCAINE 2% (20 MG/ML) 5 ML SYRINGE
INTRAMUSCULAR | Status: AC
  Filled 2017-12-24: qty 5

## 2017-12-24 MED ORDER — METHOCARBAMOL 500 MG PO TABS
ORAL_TABLET | ORAL | Status: AC
  Filled 2017-12-24: qty 1

## 2017-12-24 MED ORDER — OCUVITE-LUTEIN PO CAPS
1.0000 | ORAL_CAPSULE | Freq: Two times a day (BID) | ORAL | Status: DC
  Filled 2017-12-24 (×2): qty 1

## 2017-12-24 MED ORDER — CEFAZOLIN SODIUM-DEXTROSE 2-4 GM/100ML-% IV SOLN
2.0000 g | Freq: Three times a day (TID) | INTRAVENOUS | Status: AC
  Administered 2017-12-24 – 2017-12-25 (×2): 2 g via INTRAVENOUS
  Filled 2017-12-24 (×2): qty 100

## 2017-12-24 MED ORDER — ZOLPIDEM TARTRATE 5 MG PO TABS: 5.0000 mg | ORAL_TABLET | Freq: Every evening | ORAL | Status: DC | PRN

## 2017-12-24 MED ORDER — LEVOTHYROXINE SODIUM 137 MCG PO TABS
137.0000 ug | ORAL_TABLET | Freq: Every day | ORAL | Status: DC
  Administered 2017-12-24: 137 ug via ORAL
  Filled 2017-12-24: qty 1

## 2017-12-24 MED ORDER — ESMOLOL HCL 100 MG/10ML IV SOLN
INTRAVENOUS | Status: AC
  Filled 2017-12-24: qty 10

## 2017-12-24 MED ORDER — LIDOCAINE-EPINEPHRINE 1 %-1:100000 IJ SOLN
INTRAMUSCULAR | Status: DC | PRN
  Administered 2017-12-24: 2.5 mL

## 2017-12-24 MED ORDER — METHOCARBAMOL 500 MG PO TABS
500.0000 mg | ORAL_TABLET | Freq: Four times a day (QID) | ORAL | Status: DC | PRN
  Administered 2017-12-24 – 2017-12-25 (×3): 500 mg via ORAL
  Filled 2017-12-24 (×2): qty 1

## 2017-12-24 MED ORDER — OXYCODONE HCL 5 MG PO TABS: 10.0000 mg | ORAL_TABLET | ORAL | Status: DC | PRN

## 2017-12-24 SURGICAL SUPPLY — 90 items
ADH SKN CLS APL DERMABOND .7 (GAUZE/BANDAGES/DRESSINGS) ×2
BANDAGE GAUZE 4  KLING STR (GAUZE/BANDAGES/DRESSINGS) ×3 IMPLANT
BASKET BONE COLLECTION (BASKET) ×3 IMPLANT
BIT DRILL 14X2.5XNS TI ANT (BIT) IMPLANT
BIT DRILL AVIATOR 14 (BIT) ×3
BIT DRILL NEURO 2X3.1 SFT TUCH (MISCELLANEOUS) ×2 IMPLANT
BIT DRL 14X2.5XNS TI ANT (BIT) ×2
BLADE SURG 15 STRL LF DISP TIS (BLADE) ×2 IMPLANT
BLADE SURG 15 STRL SS (BLADE) ×3
BLADE ULTRA TIP 2M (BLADE) IMPLANT
BNDG CMPR 75X41 PLY ABS (GAUZE/BANDAGES/DRESSINGS) ×2
BNDG GAUZE ELAST 4 BULKY (GAUZE/BANDAGES/DRESSINGS) ×3 IMPLANT
BNDG STRETCH 4X75 NS LF (GAUZE/BANDAGES/DRESSINGS) ×1 IMPLANT
BUR BARREL STRAIGHT FLUTE 4.0 (BURR) ×3 IMPLANT
CABLE BIPOLOR RESECTION CORD (MISCELLANEOUS) ×3 IMPLANT
CANISTER SUCT 3000ML PPV (MISCELLANEOUS) ×3 IMPLANT
CARTRIDGE OIL MAESTRO DRILL (MISCELLANEOUS) ×2 IMPLANT
COVER MAYO STAND STRL (DRAPES) ×3 IMPLANT
DECANTER SPIKE VIAL GLASS SM (MISCELLANEOUS) ×4 IMPLANT
DERMABOND ADVANCED (GAUZE/BANDAGES/DRESSINGS) ×1
DERMABOND ADVANCED .7 DNX12 (GAUZE/BANDAGES/DRESSINGS) ×2 IMPLANT
DIFFUSER DRILL AIR PNEUMATIC (MISCELLANEOUS) ×3 IMPLANT
DRAPE C-ARM 42X72 X-RAY (DRAPES) ×2 IMPLANT
DRAPE EXTREMITY T 121X128X90 (DRAPE) ×3 IMPLANT
DRAPE HALF SHEET 40X57 (DRAPES) ×3 IMPLANT
DRAPE LAPAROTOMY 100X72 PEDS (DRAPES) ×3 IMPLANT
DRAPE MICROSCOPE LEICA (MISCELLANEOUS) ×1 IMPLANT
DRAPE POUCH INSTRU U-SHP 10X18 (DRAPES) ×3 IMPLANT
DRILL NEURO 2X3.1 SOFT TOUCH (MISCELLANEOUS) ×3
DRSG EMULSION OIL 3X3 NADH (GAUZE/BANDAGES/DRESSINGS) ×3 IMPLANT
DRSG OPSITE POSTOP 3X4 (GAUZE/BANDAGES/DRESSINGS) ×1 IMPLANT
DURAPREP 6ML APPLICATOR 50/CS (WOUND CARE) ×3 IMPLANT
ELECT COATED BLADE 2.86 ST (ELECTRODE) ×3 IMPLANT
ELECT REM PT RETURN 9FT ADLT (ELECTROSURGICAL) ×3
ELECTRODE REM PT RTRN 9FT ADLT (ELECTROSURGICAL) ×2 IMPLANT
GAUZE SPONGE 4X4 12PLY STRL (GAUZE/BANDAGES/DRESSINGS) ×3 IMPLANT
GAUZE SPONGE 4X4 12PLY STRL LF (GAUZE/BANDAGES/DRESSINGS) ×1 IMPLANT
GAUZE SPONGE 4X4 16PLY XRAY LF (GAUZE/BANDAGES/DRESSINGS) ×3 IMPLANT
GLOVE BIO SURGEON STRL SZ8 (GLOVE) ×6 IMPLANT
GLOVE BIOGEL PI IND STRL 6.5 (GLOVE) ×3 IMPLANT
GLOVE BIOGEL PI IND STRL 8 (GLOVE) ×2 IMPLANT
GLOVE BIOGEL PI IND STRL 8.5 (GLOVE) ×4 IMPLANT
GLOVE BIOGEL PI INDICATOR 6.5 (GLOVE) ×3
GLOVE BIOGEL PI INDICATOR 8 (GLOVE) ×1
GLOVE BIOGEL PI INDICATOR 8.5 (GLOVE) ×2
GLOVE ECLIPSE 8.0 STRL XLNG CF (GLOVE) ×3 IMPLANT
GLOVE EXAM NITRILE LRG STRL (GLOVE) IMPLANT
GLOVE EXAM NITRILE XL STR (GLOVE) IMPLANT
GLOVE EXAM NITRILE XS STR PU (GLOVE) IMPLANT
GLOVE SURG SS PI 6.5 STRL IVOR (GLOVE) ×3 IMPLANT
GOWN STRL REUS W/ TWL LRG LVL3 (GOWN DISPOSABLE) ×3 IMPLANT
GOWN STRL REUS W/ TWL XL LVL3 (GOWN DISPOSABLE) ×4 IMPLANT
GOWN STRL REUS W/TWL 2XL LVL3 (GOWN DISPOSABLE) ×3 IMPLANT
GOWN STRL REUS W/TWL LRG LVL3 (GOWN DISPOSABLE) ×6
GOWN STRL REUS W/TWL XL LVL3 (GOWN DISPOSABLE) ×6
HALTER HD/CHIN CERV TRACTION D (MISCELLANEOUS) ×3 IMPLANT
HEMOSTAT POWDER KIT SURGIFOAM (HEMOSTASIS) IMPLANT
KIT BASIN OR (CUSTOM PROCEDURE TRAY) ×4 IMPLANT
KIT ROOM TURNOVER OR (KITS) ×3 IMPLANT
NDL HYPO 18GX1.5 BLUNT FILL (NEEDLE) ×1 IMPLANT
NDL HYPO 25X1 1.5 SAFETY (NEEDLE) ×2 IMPLANT
NDL SPNL 22GX3.5 QUINCKE BK (NEEDLE) ×2 IMPLANT
NEEDLE HYPO 18GX1.5 BLUNT FILL (NEEDLE) IMPLANT
NEEDLE HYPO 25X1 1.5 SAFETY (NEEDLE) ×6 IMPLANT
NEEDLE SPNL 22GX3.5 QUINCKE BK (NEEDLE) ×3 IMPLANT
NS IRRIG 1000ML POUR BTL (IV SOLUTION) ×5 IMPLANT
OIL CARTRIDGE MAESTRO DRILL (MISCELLANEOUS) ×3
PACK LAMINECTOMY NEURO (CUSTOM PROCEDURE TRAY) ×3 IMPLANT
PACK SURGICAL SETUP 50X90 (CUSTOM PROCEDURE TRAY) ×3 IMPLANT
PAD ARMBOARD 7.5X6 YLW CONV (MISCELLANEOUS) ×9 IMPLANT
PEEK SPACER AVS AS 6X14X16 4D (Cage) ×1 IMPLANT
PIN DISTRACTION 14MM (PIN) ×6 IMPLANT
PLATE AVIATOR ASSY 2LVL SZ 30 (Plate) ×3 IMPLANT
RUBBERBAND STERILE (MISCELLANEOUS) ×2 IMPLANT
SCREW AVIATOR VAR SELFTAP 4X14 (Screw) ×12 IMPLANT
SPACER AVS AS 12X14X5X0 (Orthopedic Implant) ×2 IMPLANT
SPONGE INTESTINAL PEANUT (DISPOSABLE) ×3 IMPLANT
SPONGE SURGIFOAM ABS GEL SZ50 (HEMOSTASIS) IMPLANT
STAPLER SKIN PROX WIDE 3.9 (STAPLE) IMPLANT
STOCKINETTE 4X48 STRL (DRAPES) ×3 IMPLANT
SUT ETHILON 3 0 PS 1 (SUTURE) ×3 IMPLANT
SUT VIC AB 3-0 SH 8-18 (SUTURE) ×3 IMPLANT
SYR 3ML LL SCALE MARK (SYRINGE) ×2 IMPLANT
SYR BULB 3OZ (MISCELLANEOUS) ×3 IMPLANT
SYR CONTROL 10ML LL (SYRINGE) ×3 IMPLANT
TOWEL GREEN STERILE (TOWEL DISPOSABLE) ×5 IMPLANT
TOWEL GREEN STERILE FF (TOWEL DISPOSABLE) ×6 IMPLANT
TUBE CONNECTING 12X1/4 (SUCTIONS) ×2 IMPLANT
UNDERPAD 30X30 (UNDERPADS AND DIAPERS) ×3 IMPLANT
WATER STERILE IRR 1000ML POUR (IV SOLUTION) ×5 IMPLANT

## 2017-12-24 NOTE — Transfer of Care (Signed)
Immediate Anesthesia Transfer of Care Note  Patient: Nichole Smith  Procedure(s) Performed: Cervical five-six Cervical six-seven Anterior cervical decompression/discectomy/fusion (Spine Cervical) LEFT CARPAL TUNNEL RELEASE (Left Hand)  Patient Location: PACU  Anesthesia Type:General  Level of Consciousness: awake and drowsy  Airway & Oxygen Therapy: Patient Spontanous Breathing and Patient connected to nasal cannula oxygen  Post-op Assessment: Report given to RN, Post -op Vital signs reviewed and stable and Patient moving all extremities X 4  Post vital signs: Reviewed and stable  Last Vitals:  Vitals:   12/24/17 1107  BP: (!) 144/71  Pulse: 71  Resp: 18  Temp: 36.8 C  SpO2: 96%    Last Pain:  Vitals:   12/24/17 1125  TempSrc:   PainSc: 6       Patients Stated Pain Goal: 3 (18/48/59 2763)  Complications: No apparent anesthesia complications

## 2017-12-24 NOTE — Anesthesia Procedure Notes (Signed)
Procedure Name: Intubation Date/Time: 12/24/2017 3:14 PM Performed by: Wilburn Cornelia, CRNA Pre-anesthesia Checklist: Patient identified, Emergency Drugs available, Suction available and Patient being monitored Patient Re-evaluated:Patient Re-evaluated prior to induction Oxygen Delivery Method: Circle system utilized Preoxygenation: Pre-oxygenation with 100% oxygen Induction Type: IV induction and Cricoid Pressure applied Ventilation: Oral airway inserted - appropriate to patient size and Mask ventilation without difficulty Laryngoscope Size: Mac and 3 Grade View: Grade III Tube type: Oral Tube size: 7.0 mm Number of attempts: 1 Airway Equipment and Method: Stylet Placement Confirmation: ETT inserted through vocal cords under direct vision,  positive ETCO2,  CO2 detector and breath sounds checked- equal and bilateral Secured at: 21 cm Tube secured with: Tape Dental Injury: Teeth and Oropharynx as per pre-operative assessment

## 2017-12-24 NOTE — Op Note (Signed)
12/24/2017  6:00 PM  PATIENT:  Nichole Smith  62 y.o. female  PRE-OPERATIVE DIAGNOSIS:  Spinal stenosis C 56 and C 67 levels, Cervical region with myelopathy, herniated cervical disc with radiculopathy, cervicalgia; Carpal tunnel syndrome left  POST-OPERATIVE DIAGNOSIS:   Spinal stenosis C 56 and C 67 levels, Cervical region with myelopathy, herniated cervical disc with radiculopathy, cervicalgia; Carpal tunnel syndrome left  PROCEDURE:  Procedure(s): Cervical five-six Cervical six-seven Anterior cervical decompression/discectomy/fusion with PEEK cages, autograft, anterior cervical plate LEFT CARPAL TUNNEL RELEASE (Left)  SURGEON:  Surgeon(s) and Role:    Erline Levine, MD - Primary    * Eustace Moore, MD - Assisting  PHYSICIAN ASSISTANT:   ASSISTANTS: Poteat, RN   ANESTHESIA:   general  EBL:  50 mL   BLOOD ADMINISTERED:none  DRAINS: none   LOCAL MEDICATIONS USED:  MARCAINE    and LIDOCAINE   SPECIMEN:  No Specimen  DISPOSITION OF SPECIMEN:  N/A  COUNTS:  YES  TOURNIQUET:  * No tourniquets in log *  DICTATION: PROCEDURE: Patient was brought to operating room and following the smooth and uncomplicated induction of general endotracheal anesthesia her head was placed on a horseshoe head holder she was placed in 10 pounds of Holter traction and her anterior neck was prepped and draped in usual sterile fashion. An incision was made on the left side of midline after infiltrating the skin and subcutaneous tissues with local lidocaine. The platysmal layer was incised and subplatysmal dissection was performed exposing the anterior border sternocleidomastoid muscle. Using blunt dissection the carotid sheath was kept lateral and trachea and esophagus kept medial exposing the anterior cervical spine. A bent spinal needle was placed it was felt to be the C 34 and C 45  levels and this was poorly visualized on intraoperative x-ray, so after multiple attempts to visualize to the C 34  level, we brought in the C arm.  Again, after multiple attempts, we were able to see the upper needle at C 34 and to count to this level.  The remaining levels were not well visualized. Longus coli muscles were taken down from the anterior cervical spine using electrocautery and key elevator and self-retaining retractor was placed exposing the C5/6 and C6/7 levels. The interspaces were incised and a thorough discectomy was performed. Distraction pins were placed. Initially the C 67 level was operated. Uncinate spurs and central spondylitic ridges were drilled down with a high-speed drill. The spinal cord dura and both C6 nerve roots were widely decompressed.  A large central disc herniation and calcified spur were carefully removed and the spinal cord dura was thoroughly decompressed.   Hemostasis was assured. After trial sizing an 6 mm large footprint lordotic peek interbody cage was selected and packed with local autograft. This was tamped into position and countersunk appropriately. Attention was the paid to the C 56 level, where similar decompression was performed.  Uncinate spurs and central spondylitic ridges were drilled down with a high-speed drill. The spinal cord dura and both C6 nerve roots were widely decompressed. A herniated disc was removed from overlying the right C6 nerve root. Hemostasis was assured. After trial sizing a 5 mm smaller footprint peek interbody cage was selected and packed in a similar fashion. This was tamped into position and countersunk appropriately.Distraction weight was removed. A 30 mm Aviator anterior cervical plate was affixed to the cervical spine with 14 mm variable-angle screws 2 at C5, 2 at C6 and 2 at C7. All screws were  well-positioned and locking mechanisms were engaged. A final X ray was not obtained as it was not felt to be possible to visualize the operated levels. Soft tissues were inspected and found to be in good repair. The wound was irrigated. The platysma  layer was closed with 3-0 Vicryl stitches and the skin was reapproximated with 3-0 Vicryl subcuticular stitches. The wound was dressed with Dermabond. Counts were correct at the end of the case.    Patient was then repositioned with her left arm on the arm board.  Her hand and arm were prepped and draped with betadine and Duraprep and sterile stockinet.  Right wrist was infiltrated with lidocaine and an incision was made over a length of 2 cm in line with the fourth ray.  The flexor retinaculum was incised and the carpal tunnel was decompressed.  Decompression was carried into the volar wrist and into the distal palm.  Hemostasis was assured. The wound was irrigated and closed with 3-0 nylon vertical mattress stitches and dressed with a sterile occlusive dressing with bacitracin, Telfa, fluff gauze, Kerlix and cling wrap. Patient was extubated and taken to recovery in stable and satisfactory condition. Counts were correct at the end of the case.  PLAN OF CARE: Admit to inpatient   PATIENT DISPOSITION:  PACU - hemodynamically stable.   Delay start of Pharmacological VTE agent (>24hrs) due to surgical blood loss or risk of bleeding: yes

## 2017-12-24 NOTE — Interval H&P Note (Signed)
History and Physical Interval Note:  12/24/2017 3:05 PM  Nichole Smith  has presented today for surgery, with the diagnosis of Spinal stenosis, Cervical region; Carpal tunnel syndrome  The various methods of treatment have been discussed with the patient and family. After consideration of risks, benefits and other options for treatment, the patient has consented to  Procedure(s) with comments: C5-6 C6-7 Anterior cervical decompression/discectomy/fusion (N/A) - C5-6 C6-7 Anterior cervical decompression/discectomy/fusion RIGHT CARPAL TUNNEL RELEASE (Left) - RIGHT CARPAL TUNNEL RELEASE as a surgical intervention .  The patient's history has been reviewed, patient examined, no change in status, stable for surgery.  I have reviewed the patient's chart and labs.  Questions were answered to the patient's satisfaction.     Diva Lemberger D

## 2017-12-24 NOTE — Brief Op Note (Signed)
12/24/2017  6:00 PM  PATIENT:  Nichole Smith  62 y.o. female  PRE-OPERATIVE DIAGNOSIS:  Spinal stenosis C 56 and C 67 levels, Cervical region with myelopathy, herniated cervical disc with radiculopathy, cervicalgia; Carpal tunnel syndrome left  POST-OPERATIVE DIAGNOSIS:   Spinal stenosis C 56 and C 67 levels, Cervical region with myelopathy, herniated cervical disc with radiculopathy, cervicalgia; Carpal tunnel syndrome left  PROCEDURE:  Procedure(s): Cervical five-six Cervical six-seven Anterior cervical decompression/discectomy/fusion with PEEK cages, autograft, anterior cervical plate LEFT CARPAL TUNNEL RELEASE (Left)  SURGEON:  Surgeon(s) and Role:    Erline Levine, MD - Primary    * Eustace Moore, MD - Assisting  PHYSICIAN ASSISTANT:   ASSISTANTS: Poteat, RN   ANESTHESIA:   general  EBL:  50 mL   BLOOD ADMINISTERED:none  DRAINS: none   LOCAL MEDICATIONS USED:  MARCAINE    and LIDOCAINE   SPECIMEN:  No Specimen  DISPOSITION OF SPECIMEN:  N/A  COUNTS:  YES  TOURNIQUET:  * No tourniquets in log *  DICTATION: PROCEDURE: Patient was brought to operating room and following the smooth and uncomplicated induction of general endotracheal anesthesia her head was placed on a horseshoe head holder she was placed in 10 pounds of Holter traction and her anterior neck was prepped and draped in usual sterile fashion. An incision was made on the left side of midline after infiltrating the skin and subcutaneous tissues with local lidocaine. The platysmal layer was incised and subplatysmal dissection was performed exposing the anterior border sternocleidomastoid muscle. Using blunt dissection the carotid sheath was kept lateral and trachea and esophagus kept medial exposing the anterior cervical spine. A bent spinal needle was placed it was felt to be the C 34 and C 45  levels and this was poorly visualized on intraoperative x-ray, so after multiple attempts to visualize to the C 34  level, we brought in the C arm.  Again, after multiple attempts, we were able to see the upper needle at C 34 and to count to this level.  The remaining levels were not well visualized. Longus coli muscles were taken down from the anterior cervical spine using electrocautery and key elevator and self-retaining retractor was placed exposing the C5/6 and C6/7 levels. The interspaces were incised and a thorough discectomy was performed. Distraction pins were placed. Initially the C 67 level was operated. Uncinate spurs and central spondylitic ridges were drilled down with a high-speed drill. The spinal cord dura and both C6 nerve roots were widely decompressed.  A large central disc herniation and calcified spur were carefully removed and the spinal cord dura was thoroughly decompressed.   Hemostasis was assured. After trial sizing an 6 mm large footprint lordotic peek interbody cage was selected and packed with local autograft. This was tamped into position and countersunk appropriately. Attention was the paid to the C 56 level, where similar decompression was performed.  Uncinate spurs and central spondylitic ridges were drilled down with a high-speed drill. The spinal cord dura and both C6 nerve roots were widely decompressed. A herniated disc was removed from overlying the right C6 nerve root. Hemostasis was assured. After trial sizing a 5 mm smaller footprint peek interbody cage was selected and packed in a similar fashion. This was tamped into position and countersunk appropriately.Distraction weight was removed. A 30 mm Aviator anterior cervical plate was affixed to the cervical spine with 14 mm variable-angle screws 2 at C5, 2 at C6 and 2 at C7. All screws were  well-positioned and locking mechanisms were engaged. A final X ray was not obtained as it was not felt to be possible to visualize the operated levels. Soft tissues were inspected and found to be in good repair. The wound was irrigated. The platysma  layer was closed with 3-0 Vicryl stitches and the skin was reapproximated with 3-0 Vicryl subcuticular stitches. The wound was dressed with Dermabond. Counts were correct at the end of the case.    Patient was then repositioned with her left arm on the arm board.  Her hand and arm were prepped and draped with betadine and Duraprep and sterile stockinet.  Right wrist was infiltrated with lidocaine and an incision was made over a length of 2 cm in line with the fourth ray.  The flexor retinaculum was incised and the carpal tunnel was decompressed.  Decompression was carried into the volar wrist and into the distal palm.  Hemostasis was assured. The wound was irrigated and closed with 3-0 nylon vertical mattress stitches and dressed with a sterile occlusive dressing with bacitracin, Telfa, fluff gauze, Kerlix and cling wrap. Patient was extubated and taken to recovery in stable and satisfactory condition. Counts were correct at the end of the case.  PLAN OF CARE: Admit to inpatient   PATIENT DISPOSITION:  PACU - hemodynamically stable.   Delay start of Pharmacological VTE agent (>24hrs) due to surgical blood loss or risk of bleeding: yes

## 2017-12-24 NOTE — Anesthesia Preprocedure Evaluation (Addendum)
Anesthesia Evaluation  Patient identified by MRN, date of birth, ID band Patient awake    Reviewed: Allergy & Precautions, H&P , NPO status , Patient's Chart, lab work & pertinent test results, reviewed documented beta blocker date and time   Airway Mallampati: II  TM Distance: >3 FB Neck ROM: Full    Dental no notable dental hx. (+) Teeth Intact, Dental Advisory Given   Pulmonary sleep apnea and Continuous Positive Airway Pressure Ventilation ,    Pulmonary exam normal breath sounds clear to auscultation       Cardiovascular hypertension, Pt. on medications and Pt. on home beta blockers + dysrhythmias  Rhythm:Regular Rate:Normal     Neuro/Psych negative neurological ROS  negative psych ROS   GI/Hepatic negative GI ROS, Neg liver ROS,   Endo/Other  Hypothyroidism Morbid obesity  Renal/GU negative Renal ROS  negative genitourinary   Musculoskeletal  (+) Arthritis , Osteoarthritis,    Abdominal   Peds  Hematology negative hematology ROS (+)   Anesthesia Other Findings   Reproductive/Obstetrics negative OB ROS                             Anesthesia Physical Anesthesia Plan  ASA: III  Anesthesia Plan: General   Post-op Pain Management:    Induction: Intravenous  PONV Risk Score and Plan: 4 or greater and Ondansetron, Dexamethasone and Midazolam  Airway Management Planned: Oral ETT  Additional Equipment:   Intra-op Plan:   Post-operative Plan: Extubation in OR  Informed Consent: I have reviewed the patients History and Physical, chart, labs and discussed the procedure including the risks, benefits and alternatives for the proposed anesthesia with the patient or authorized representative who has indicated his/her understanding and acceptance.   Dental advisory given  Plan Discussed with: CRNA  Anesthesia Plan Comments:         Anesthesia Quick Evaluation

## 2017-12-25 ENCOUNTER — Encounter (HOSPITAL_COMMUNITY): Payer: Self-pay | Admitting: Neurosurgery

## 2017-12-25 DIAGNOSIS — M4802 Spinal stenosis, cervical region: Secondary | ICD-10-CM | POA: Diagnosis not present

## 2017-12-25 MED ORDER — PROSIGHT PO TABS
1.0000 | ORAL_TABLET | Freq: Two times a day (BID) | ORAL | Status: DC
  Filled 2017-12-25: qty 1

## 2017-12-25 MED ORDER — METHOCARBAMOL 750 MG PO TABS: 375.0000 mg | ORAL_TABLET | Freq: Four times a day (QID) | ORAL | 1 refills | Status: DC | PRN

## 2017-12-25 MED ORDER — OXYCODONE HCL 5 MG PO TABS: 5.0000 mg | ORAL_TABLET | ORAL | 0 refills | Status: DC | PRN

## 2017-12-25 NOTE — Progress Notes (Signed)
Patient alert and oriented, mae's well, voiding adequate amount of urine, swallowing without difficulty, no c/o pain at time of discharge. Patient discharged home with family. Script and discharged instructions given to patient. Patient and family stated understanding of instructions given. Patient has an appointment with Dr.Stern    

## 2017-12-25 NOTE — Evaluation (Signed)
Occupational Therapy Evaluation and Discharge  Patient Details Name: Nichole Smith MRN: [id number] DOB: 1956/07/31 Today's Date: 12/25/2017    History of Present Illness C5-C7 ACDF and carpal tunnel release on the L on 12/24/17.    Clinical Impression   Pt reports she was independent with ADL PTA. Currently pt supervision with ADL and functional mobility. All neck, safety, and ADL education completed with pt. Pt planning to d/c home with 24/7 supervision from family. No further acute OT needs identified; signing off at this time. Please re-consult if needs change. Thank you for this referral.    Follow Up Recommendations  No OT follow up;Supervision - Intermittent    Equipment Recommendations  None recommended by OT    Recommendations for Other Services       Precautions / Restrictions Precautions Precautions: Fall;Cervical Precaution Comments: Educated pt on cervical precautions Required Braces or Orthoses: Cervical Brace Cervical Brace: Soft collar Restrictions Weight Bearing Restrictions: No      Mobility Bed Mobility Overal bed mobility: Needs Assistance Bed Mobility: Rolling;Sidelying to Sit Rolling: Supervision Sidelying to sit: Supervision       General bed mobility comments: For safety, cues for log roll technique  Transfers Overall transfer level: Needs assistance Equipment used: None Transfers: Sit to/from Stand Sit to Stand: Supervision         General transfer comment: for safety initially    Balance Overall balance assessment: Needs assistance Sitting-balance support: Feet supported;No upper extremity supported Sitting balance-Leahy Scale: Good     Standing balance support: No upper extremity supported;During functional activity Standing balance-Leahy Scale: Good                             ADL either performed or assessed with clinical judgement   ADL Overall ADL's : Needs assistance/impaired Eating/Feeding:  Independent;Sitting   Grooming: Supervision/safety;Standing Grooming Details (indicate cue type and reason): Educated on use of 2 cups for oral care Upper Body Bathing: Set up;Sitting   Lower Body Bathing: Supervison/ safety;Sit to/from stand   Upper Body Dressing : Set up;Sitting Upper Body Dressing Details (indicate cue type and reason): Educated pt on brace management and wear Lower Body Dressing: Supervision/safety;Sit to/from stand Lower Body Dressing Details (indicate cue type and reason): Pt able to bring foot up on EOB to adjust sock without bending neck Toilet Transfer: Supervision/safety;Ambulation;Regular Toilet       Tub/ Banker: Supervision/safety;Walk-in shower;Ambulation   Functional mobility during ADLs: Supervision/safety General ADL Comments: Educated pt on maintaining cervical precautions during functional activities, log roll for bed mobility, and frequent mobility throughout the day upon return home.     Vision         Perception     Praxis      Pertinent Vitals/Pain Pain Assessment: Faces Faces Pain Scale: Hurts a little bit Pain Location: neck Pain Descriptors / Indicators: Sore Pain Intervention(s): Monitored during session;Repositioned     Hand Dominance Right   Extremity/Trunk Assessment Upper Extremity Assessment Upper Extremity Assessment: LUE deficits/detail LUE Deficits / Details: Acute pain, decreased strength and AROM consistent with carpal tunnel release LUE: Unable to fully assess due to immobilization   Lower Extremity Assessment Lower Extremity Assessment: Defer to PT evaluation   Cervical / Trunk Assessment Cervical / Trunk Assessment: Other exceptions Cervical / Trunk Exceptions: s/p surgery   Communication Communication Communication: No difficulties   Cognition Arousal/Alertness: Awake/alert Behavior During Therapy: WFL for tasks assessed/performed Overall Cognitive Status: Within  Functional Limits for tasks  assessed                                     General Comments       Exercises     Shoulder Instructions      Home Living Family/patient expects to be discharged to:: Private residence Living Arrangements: Spouse/significant other Available Help at Discharge: Family;Available 24 hours/day Type of Home: House Home Access: Stairs to enter [employer] of Steps: 2 Entrance Stairs-Rails: Right Home Layout: Multi-level;Bed/bath upstairs Alternate Level Stairs-Number of Steps: 13 Alternate Level Stairs-Rails: Right Bathroom Shower/Tub: Occupational psychologist: Handicapped height     Home Equipment: Civil engineer, contracting - built in;Hand held shower head          Prior Functioning/Environment Level of Independence: Independent                 OT Problem List:        OT Treatment/Interventions:      OT Goals(Current goals can be found in the care plan section) Acute Rehab OT Goals Patient Stated Goal: Return home today OT Goal Formulation: All assessment and education complete, DC therapy  OT Frequency:     Barriers to D/C:            Co-evaluation              AM-PAC PT "6 Clicks" Daily Activity     Outcome Measure Help from another person eating meals?: None Help from another person taking care of personal grooming?: A Little Help from another person toileting, which includes using toliet, bedpan, or urinal?: A Little Help from another person bathing (including washing, rinsing, drying)?: A Little Help from another person to put on and taking off regular upper body clothing?: None Help from another person to put on and taking off regular lower body clothing?: A Little 6 Click Score: 20   End of Session Nurse Communication: Mobility status;Other (comment)(no equipment or f/u recommendations)  Activity Tolerance: Patient tolerated treatment well Patient left: with family/visitor present;Other (comment)(sitting EOB)  OT Visit  Diagnosis: Pain Pain - part of body: (back)                Time: 5462-7035 OT Time Calculation (min): 10 min Charges:  OT General Charges $OT Visit: 1 Visit OT Evaluation $OT Eval Low Complexity: 1 Low G-Codes:     Tracia Lacomb A. Ulice Brilliant, M.S., OTR/L Pager: [place] 12/25/2017, 1:57 PM

## 2017-12-25 NOTE — Progress Notes (Addendum)
Subjective: Patient reports "I'm doing well...sore in the back of my neck"  Objective: Vital signs in last 24 hours: Temp:  [97.4 F (36.3 C)-98.4 F (36.9 C)] 98.4 F (36.9 C) (01/25 0400) Pulse Rate:  [71-95] 92 (01/25 0400) Resp:  [12-24] 18 (01/25 0400) BP: (134-162)/(60-75) 134/73 (01/25 0400) SpO2:  [93 %-96 %] 95 % (01/25 0400) Weight:  [105.3 kg (232 lb 3.2 oz)] 105.3 kg (232 lb 3.2 oz) (01/24 1107)  Intake/Output from previous day: 01/24 0701 - 01/25 0700 In: 1480 [P.O.:80; I.V.:1400] Out: 50 [Blood:50] Intake/Output this shift: No intake/output data recorded.  Alert, smiling, reports mild posterior cervical pain. Only mild pain left wrist with activity this morning. Left wrist drsg inatct, dry. Cervical incision without erythema, swelling, or drainage beneath Dermabond and honeycomb drsg. Good strength BUE.   Lab Results: No results for input(s): WBC, HGB, HCT, PLT in the last 72 hours. BMET No results for input(s): NA, K, CL, CO2, GLUCOSE, BUN, CREATININE, CALCIUM in the last 72 hours.  Studies/Results: Dg Cervical Spine 2-3 Views  Result Date: 12/24/2017 CLINICAL DATA:  Anterior cervical decompression/discectomy/fusion at C5-6 and C6-7. EXAM: DG C-ARM 61-120 MIN; CERVICAL SPINE - 2-3 VIEW COMPARISON:  None. FINDINGS: 26.7 seconds of fluoroscopic time was utilized during intraoperative the C5-6 and C6-7 anterior cervical decompression/discectomy/fusion procedure. Levels are somewhat difficult to visualize due to overlap from the patient's shoulders. Lateral images labeled 3 and 4 demonstrate thin metallic localizing markers from an anterior approach at approximately the C3-4 and C4-5 levels based on visualization of the base of C2. Images labeled 1 and 2 are more limited S2 level. IMPRESSION: Fluoroscopic time utilized during cervical decompression/discectomy/fusion procedures planned at C5-6 and C6-7. Exact levels are difficult to determine given lack of landmarks of the  first 2 images provided. Images 3 and 4 demonstrate thin metallic markers at what appear to be the C3-4 and C4-5 levels. Electronically Signed   By: Ashley Royalty M.D.   On: 12/24/2017 19:28   Dg C-arm 1-60 Min  Result Date: 12/24/2017 CLINICAL DATA:  Anterior cervical decompression/discectomy/fusion at C5-6 and C6-7. EXAM: DG C-ARM 61-120 MIN; CERVICAL SPINE - 2-3 VIEW COMPARISON:  None. FINDINGS: 26.7 seconds of fluoroscopic time was utilized during intraoperative the C5-6 and C6-7 anterior cervical decompression/discectomy/fusion procedure. Levels are somewhat difficult to visualize due to overlap from the patient's shoulders. Lateral images labeled 3 and 4 demonstrate thin metallic localizing markers from an anterior approach at approximately the C3-4 and C4-5 levels based on visualization of the base of C2. Images labeled 1 and 2 are more limited S2 level. IMPRESSION: Fluoroscopic time utilized during cervical decompression/discectomy/fusion procedures planned at C5-6 and C6-7. Exact levels are difficult to determine given lack of landmarks of the first 2 images provided. Images 3 and 4 demonstrate thin metallic markers at what appear to be the C3-4 and C4-5 levels. Electronically Signed   By: Ashley Royalty M.D.   On: 12/24/2017 19:28    Assessment/Plan: Improving  LOS: 1 day  Per DrStern, d/c to home. Pt verbalizes understanding of d/c instructions. She has appt for suture removal in 2 weeks. Rx's Oxycodone and Robaxin for prn home use.   Verdis Prime 12/25/2017, 7:45 AM  Patient is doing well.  Discharge home.

## 2017-12-25 NOTE — Evaluation (Signed)
Physical Therapy Evaluation and Discharge Patient Details Name: Nichole Smith MRN: [id number] DOB: 1956-08-08 Today's Date: 12/25/2017   History of Present Illness  C5-C7 ACDF and carpal tunnel release on the L on 12/24/17.   Clinical Impression  Patient evaluated by Physical Therapy with no further acute PT needs identified. All education has been completed and the patient has no further questions. At the time of PT eval pt was able to perform transfers and ambulation with gross modified independence to light supervision for safety. Pt was educated on positioning, maintenance of precautions during mobility, activity progression, car transfer, and stair negotiation. See below for any follow-up Physical Therapy or equipment needs. PT is signing off. Thank you for this referral.     Follow Up Recommendations No PT follow up;Supervision for mobility/OOB    Equipment Recommendations  None recommended by PT    Recommendations for Other Services       Precautions / Restrictions Precautions Precautions: Fall;Cervical Required Braces or Orthoses: Cervical Brace Cervical Brace: Soft collar Restrictions Weight Bearing Restrictions: No Other Position/Activity Restrictions: Limited weight bearing through L wrist throughout session      Mobility  Bed Mobility               General bed mobility comments: Pt sitting up on EOB when PT arrived. Verbally reviewed log roll technique.   Transfers Overall transfer level: Needs assistance Equipment used: None Transfers: Sit to/from Stand Sit to Stand: Modified independent (Device/Increase time)         General transfer comment: No unsteadiness or LOB noted with transfer to/from sitting. Pt demonstrated good control for stand>sit and minimized pressure through L wrist.   Ambulation/Gait Ambulation/Gait assistance: Supervision;Modified independent (Device/Increase time) Ambulation Distance (Feet): 400 Feet Assistive device: None    Gait velocity: Decreased Gait velocity interpretation: Below normal speed for age/gender General Gait Details: Light supervision provided initially due to pt stating she feels "woozy" from pain medication. Pt quickly progressed to modified independence as distance progressed.   Stairs Stairs: Yes Stairs assistance: Supervision Stair Management: One rail Right;Step to pattern;Forwards Number of Stairs: 10 General stair comments: VC's for sequencing and general safety with stair negotiation. Pt was able to demonstrate well without assistance.   Wheelchair Mobility    Modified Rankin (Stroke Patients Only)       Balance Overall balance assessment: Needs assistance Sitting-balance support: Feet supported;No upper extremity supported Sitting balance-Leahy Scale: Good     Standing balance support: No upper extremity supported;During functional activity Standing balance-Leahy Scale: Fair                               Pertinent Vitals/Pain Pain Assessment: Faces Faces Pain Scale: Hurts little more Pain Location: Incision sites Pain Descriptors / Indicators: Operative site guarding Pain Intervention(s): Limited activity within patient's tolerance;Monitored during session;Repositioned    Home Living Family/patient expects to be discharged to:: Private residence Living Arrangements: Spouse/significant other Available Help at Discharge: Family;Available 24 hours/day Type of Home: House Home Access: Stairs to enter Entrance Stairs-Rails: Right Entrance Stairs-Number of Steps: 2 Home Layout: Multi-level Home Equipment: Shower seat - built in      Prior Function Level of Independence: Independent               Hand Dominance        Extremity/Trunk Assessment   Upper Extremity Assessment Upper Extremity Assessment: LUE deficits/detail LUE Deficits / Details: Acute pain, decreased strength  and AROM consistent with carpal tunnel release    Lower  Extremity Assessment Lower Extremity Assessment: Overall WFL for tasks assessed    Cervical / Trunk Assessment Cervical / Trunk Assessment: Other exceptions Cervical / Trunk Exceptions: s/p surgery  Communication   Communication: No difficulties  Cognition Arousal/Alertness: Awake/alert Behavior During Therapy: WFL for tasks assessed/performed Overall Cognitive Status: Within Functional Limits for tasks assessed                                        General Comments      Exercises     Assessment/Plan    PT Assessment Patent does not need any further PT services  PT Problem List         PT Treatment Interventions      PT Goals (Current goals can be found in the Care Plan section)  Acute Rehab PT Goals Patient Stated Goal: Return home today PT Goal Formulation: All assessment and education complete, DC therapy    Frequency     Barriers to discharge        Co-evaluation               AM-PAC PT "6 Clicks" Daily Activity  Outcome Measure Difficulty turning over in bed (including adjusting bedclothes, sheets and blankets)?: None Difficulty moving from lying on back to sitting on the side of the bed? : A Little Difficulty sitting down on and standing up from a chair with arms (e.g., wheelchair, bedside commode, etc,.)?: A Little Help needed moving to and from a bed to chair (including a wheelchair)?: A Little Help needed walking in hospital room?: A Little Help needed climbing 3-5 steps with a railing? : A Little 6 Click Score: 19    End of Session Equipment Utilized During Treatment: Gait belt;Cervical collar Activity Tolerance: Patient tolerated treatment well Patient left: in chair;with call bell/phone within reach;with family/visitor present Nurse Communication: Mobility status PT Visit Diagnosis: Unsteadiness on feet (R26.81);Pain;Other symptoms and signs involving the nervous system (R29.898) Pain - Right/Left: Left Pain - part of  body: Hand(and neck)    Time: 8242-3536 PT Time Calculation (min) (ACUTE ONLY): 16 min   Charges:   PT Evaluation $PT Eval Moderate Complexity: 1 Mod     PT G Codes:        Rolinda Roan, PT, DPT Acute Rehabilitation Services Pager: [phone]   [name] Comp 12/25/2017, 9:44 AM

## 2017-12-25 NOTE — Discharge Summary (Signed)
Physician Discharge Summary  Patient ID: Nichole Smith MRN: [id number] DOB/AGE: 1956/08/09 62 y.o.  Admit date: 12/24/2017 Discharge date: 12/25/2017  Admission Diagnoses: Spinal stenosis C 56 and C 67 levels, Cervical region with myelopathy, herniated cervical disc with radiculopathy, cervicalgia; Carpal tunnel syndrome left    Discharge Diagnoses: Spinal stenosis C 56 and C 67 levels, Cervical region with myelopathy, herniated cervical disc with radiculopathy, cervicalgia; Carpal tunnel syndrome left; now s/p Cervical five-six Cervical six-seven Anterior cervical decompression/discectomy/fusion with PEEK cages, autograft, anterior cervical plate LEFT CARPAL TUNNEL RELEASE (Left)     Active Problems:   Cervical stenosis of spinal canal   Discharged Condition: good  Hospital Course: Nichole Smith was admitted for surgerty with dx cervical stenosis and left carpal tunnel syndrome. Following uncomplicated ACDF K7-4, Q5-9 and left CTR, she recovered nicely and transferred to [facility] for nursing care. She has mobilized well.    Consults: None  Significant Diagnostic Studies: radiology: X-Ray: intar-op  Treatments: surgery: Cervical five-six Cervical six-seven Anterior cervical decompression/discectomy/fusion with PEEK cages, autograft, anterior cervical plate LEFT CARPAL TUNNEL RELEASE (Left)    Discharge Exam: Blood pressure 134/73, pulse 92, temperature 98.4 F (36.9 C), temperature source Oral, resp. rate 18, height 5' (1.524 m), weight 105.3 kg (232 lb 3.2 oz), last menstrual period 02/12/2011, SpO2 95 %. Alert, smiling, reports mild posterior cervical pain. Only mild pain left wrist with activity this morning. Left wrist drsg inatct, dry. Cervical incision without erythema, swelling, or drainage beneath Dermabond and honeycomb drsg. Good strength BUE.    Disposition: 01-Home or Self Care  Pt verbalizes understanding of d/c instructions. She has appt for  suture removal in 2 weeks. Rx's Oxycodone and Robaxin for prn home use.     Allergies as of 12/25/2017      Reactions   Neomycin Other (See Comments)   Inflammation--eye drop   Ace Inhibitors Cough      Medication List    TAKE these medications   aspirin EC 81 MG tablet Take 81 mg by mouth at bedtime.   atorvastatin 40 MG tablet Commonly known as:  LIPITOR Take 1 tablet (40 mg total) by mouth daily.   CALCIUM PO Take 1 tablet by mouth at bedtime.   cyclobenzaprine 5 MG tablet Commonly known as:  FLEXERIL Take 1 tablet (5 mg total) by mouth at bedtime as needed and may repeat dose one time if needed for muscle spasms.   diltiazem 180 MG 24 hr capsule Commonly known as:  CARDIZEM CD Take 1 capsule (180 mg total) by mouth daily. What changed:  when to take this   estradiol 0.1 MG/GM vaginal cream Commonly known as:  ESTRACE Apply small amount externally three times weekly as directed   hydrochlorothiazide 12.5 MG capsule Commonly known as:  MICROZIDE Take 1 capsule (12.5 mg total) by mouth daily.   hydroxypropyl methylcellulose / hypromellose 2.5 % ophthalmic solution Commonly known as:  ISOPTO TEARS / GONIOVISC Place 1 drop into both eyes 5 (five) times daily as needed for dry eyes.   methocarbamol 750 MG tablet Commonly known as:  ROBAXIN Take 0.5 tablets (375 mg total) by mouth every 6 (six) hours as needed for muscle spasms.   metoprolol succinate 50 MG 24 hr tablet Commonly known as:  TOPROL-XL Take 1 tablet (50 mg total) by mouth daily.   naproxen sodium 220 MG tablet Commonly known as:  ALEVE Take 220 mg by mouth 2 (two) times daily as needed (for pain.).   oxyCODONE 5  MG immediate release tablet Commonly known as:  Oxy IR/ROXICODONE Take 1-2 tablets (5-10 mg total) by mouth every 4 (four) hours as needed for moderate pain ((score 4 to 6)).   PRESERVISION AREDS 2 PO Take 1 tablet by mouth 2 (two) times daily.   SYNTHROID 137 MCG tablet Generic  drug:  levothyroxine TAKE 1 TABLET DAILY What changed:    how much to take  how to take this  when to take this   triamcinolone ointment 0.5 % Commonly known as:  KENALOG Apply 1 application topically 2 (two) times daily. Can use for up to 7 days with flare. What changed:    when to take this  reasons to take this  additional instructions        Signed: Peggyann Shoals, MD 12/25/2017, 8:11 AM

## 2017-12-25 NOTE — Anesthesia Postprocedure Evaluation (Signed)
Anesthesia Post Note  Patient: Nichole Smith  Procedure(s) Performed: Cervical five-six Cervical six-seven Anterior cervical decompression/discectomy/fusion (Spine Cervical) LEFT CARPAL TUNNEL RELEASE (Left Hand)     Patient location during evaluation: PACU Anesthesia Type: General Level of consciousness: awake and alert Pain management: pain level controlled Vital Signs Assessment: post-procedure vital signs reviewed and stable Respiratory status: spontaneous breathing, nonlabored ventilation and respiratory function stable Cardiovascular status: blood pressure returned to baseline and stable Postop Assessment: no apparent nausea or vomiting Anesthetic complications: no    Last Vitals:  Vitals:   12/25/17 0400 12/25/17 0750  BP: 134/73 128/65  Pulse: 92 88  Resp: 18 18  Temp: 36.9 C 36.8 C  SpO2: 95% 95%    Last Pain:  Vitals:   12/25/17 0750  TempSrc: Oral  PainSc:                  Thermon Zulauf,W. EDMOND

## 2018-01-18 ENCOUNTER — Other Ambulatory Visit: Payer: Self-pay | Admitting: Internal Medicine

## 2018-02-04 ENCOUNTER — Encounter: Payer: Self-pay | Admitting: Internal Medicine

## 2018-02-04 ENCOUNTER — Ambulatory Visit (INDEPENDENT_AMBULATORY_CARE_PROVIDER_SITE_OTHER): Admitting: Internal Medicine

## 2018-02-04 VITALS — BP 124/86 | HR 74 | Temp 98.2°F | Wt 231.7 lb

## 2018-02-04 DIAGNOSIS — R05 Cough: Secondary | ICD-10-CM | POA: Diagnosis not present

## 2018-02-04 DIAGNOSIS — B370 Candidal stomatitis: Secondary | ICD-10-CM

## 2018-02-04 DIAGNOSIS — J029 Acute pharyngitis, unspecified: Secondary | ICD-10-CM

## 2018-02-04 DIAGNOSIS — R058 Other specified cough: Secondary | ICD-10-CM

## 2018-02-04 LAB — POCT RAPID STREP A (OFFICE): RAPID STREP A SCREEN: NEGATIVE

## 2018-02-04 MED ORDER — CLOTRIMAZOLE 10 MG MT TROC: 10.0000 mg | Freq: Every day | OROMUCOSAL | 0 refills | Status: DC

## 2018-02-04 MED ORDER — HYDROCODONE-HOMATROPINE 5-1.5 MG/5ML PO SYRP: 5.0000 mL | ORAL_SOLUTION | Freq: Four times a day (QID) | ORAL | 0 refills | Status: DC | PRN

## 2018-02-04 NOTE — Progress Notes (Signed)
Chief Complaint  Patient presents with  . Sore Throat    Cough, sore throat x 2 weeks, started with a little cold. White spots and "rash" in throat and mouth(roof of mouth and cheeks). Pt states that she just feels bad.  Pt had abx in the hospital after surgery Dec 24, 2017 but nothing since that would cause thrush.,    HPI: LIZBET CIRRINCIONE 62 y.o. come in   Mouth rash   Past week  Problematic .  No fever crouping.  No fever but has rash sore spots in mouth under tongue  And white spots side of cheeks  For 2 weeks  Antibiotic only at her surgery  n o current pain meds . Jan 24 surgery .  Left hand and  c spine .  ROS: See pertinent positives and negatives per HPI. No vomiting in dysphagia  Surgery has been helpful for her arms  Past Medical History:  Diagnosis Date  . Arthritis    knees, back  . Cancer ([place])   . Carcinoid tumor of lung 2012   right found incidentally on chest xray eval for r/o vasxculitis   . Carpal tunnel syndrome, bilateral   . Coronary artery calcification 07/23/2016  . Double vision    history, no current problem  . Dysrhythmia    irregular occassional, no current problem  . HNP (herniated nucleus pulposus), cervical 01/22/2011   C6-C7  Has seen dr Vertell Limber for this and Dr Tonita Cong  Positional numbness in hands no weakness    . Hypertension   . Hypothyroidism   . 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 Obstructive sleep apnea 07/27/2014   NPSG 07/2014:  AHI 85/hr, optimal cpap 10cm.  Download 08/2014:  Good compliance, breakthru apnea on 10cm.  Changed to auto 5-12cm >> good control of AHI on f/u download ONO on CPAP:      . SUI (stress urinary incontinence, female)    h/o    Family History  Problem Relation Age of Onset  . Stroke Mother        died age 53  . Hypertension Mother   . Coronary artery disease Father        died age 47  . Hypertension Father   . Parkinson's disease Brother   . Diabetes Brother   . Hypertension Brother   . Hypertension  Maternal Uncle   . Colon cancer Neg Hx   . Rectal cancer Neg Hx   . Stomach cancer Neg Hx     Social History   Socioeconomic History  . Marital status: Married    Spouse name: None  . Number of children: 3  . Years of education: None  . Highest education level: None  Social Needs  . Financial resource strain: None  . Food insecurity - worry: None  . Food insecurity - inability: None  . Transportation needs - medical: None  . Transportation needs - non-medical: None  Occupational History  . Occupation: retied    Fish farm manager: [employer]  Tobacco Use  . Smoking status: Never Smoker  . Smokeless tobacco: Never Used  Substance and Sexual Activity  . Alcohol use: Yes    Alcohol/week: 1.2 oz    Types: 2 Glasses of wine per week    Comment: occasional  . Drug use: No  . Sexual activity: Yes    Partners: Male    Birth control/protection: None, Post-menopausal  Other Topics Concern  . None  Social History Narrative  hhof 2    2 Children at college and  beyond      [name]    Married    Retired age 40 2015    Outpatient Medications Prior to Visit  Medication Sig Dispense Refill  . aspirin EC 81 MG tablet Take 81 mg by mouth at bedtime.    [name] atorvastatin (LIPITOR) 40 MG tablet Take 1 tablet (40 mg total) by mouth daily. 90 tablet 3  . CALCIUM PO Take 1 tablet by mouth at bedtime.     [name] diltiazem (CARDIZEM CD) 180 MG 24 hr capsule Take 1 capsule (180 mg total) by mouth daily. (Patient taking differently: Take 180 mg by mouth at bedtime. ) 90 capsule 3  . estradiol (ESTRACE) 0.1 MG/GM vaginal cream Apply small amount externally three times weekly as directed 42.5 g 1  . hydrochlorothiazide (MICROZIDE) 12.5 MG capsule TAKE 1 CAPSULE DAILY 90 capsule 0  . methocarbamol (ROBAXIN) 750 MG tablet Take 0.5 tablets (375 mg total) by mouth every 6 (six) hours as needed for muscle spasms. 60 tablet 1  . metoprolol succinate (TOPROL-XL) 50 MG 24 hr tablet Take  1 tablet (50 mg total) by mouth daily. 90 tablet 3  . Multiple Vitamins-Minerals (PRESERVISION AREDS 2 PO) Take 1 tablet by mouth 2 (two) times daily.    . naproxen sodium (ALEVE) 220 MG tablet Take 220 mg by mouth 2 (two) times daily as needed (for pain.).    [name] SYNTHROID 137 MCG tablet TAKE 1 TABLET DAILY 90 tablet 0  . triamcinolone ointment (KENALOG) 0.5 % Apply 1 application topically 2 (two) times daily. Can use for up to 7 days with flare. (Patient taking differently: Apply 1 application topically 2 (two) times daily as needed (for vaginal inflammation/itching.). Can use for up to 7 days with flare.) 60 g 3  . cyclobenzaprine (FLEXERIL) 5 MG tablet Take 1 tablet (5 mg total) by mouth at bedtime as needed and may repeat dose one time if needed for muscle spasms. (Patient not taking: Reported on 02/04/2018) 30 tablet 1  . hydroxypropyl methylcellulose / hypromellose (ISOPTO TEARS / GONIOVISC) 2.5 % ophthalmic solution Place 1 drop into both eyes 5 (five) times daily as needed for dry eyes.     [name] oxyCODONE (OXY IR/ROXICODONE) 5 MG immediate release tablet Take 1-2 tablets (5-10 mg total) by mouth every 4 (four) hours as needed for moderate pain ((score 4 to 6)). (Patient not taking: Reported on 02/04/2018) 60 tablet 0   No facility-administered medications prior to visit.      EXAM:  BP 124/86 (BP Location: Right Arm, Patient Position: Sitting, Cuff Size: Large)   Pulse 74   Temp 98.2 F (36.8 C) (Oral)   Wt 231 lb 11.2 oz (105.1 kg)   LMP 02/12/2011   BMI 45.25 kg/m   Body mass index is 45.25 kg/m.  GENERAL: vitals reviewed and listed above, alert, oriented, appears well hydrated and in no acute distress HEENT: atraumatic, conjunctiva  clear, no obvious abnormalities on inspection of external nose and ears OP :  no edema   Red spots  Roof of mouth  White spots    Buccal mucosa     NECK: no obvious masses on inspection palpation healing scar   LUNGS: clear to auscultation bilaterally, no  wheezes, rales or rhonchi, good air movement CV: HRRR, no clubbing cyanosis or  peripheral edema nl cap refill  MS: moves all extremities without noticeable focal  abnormality PSYCH: pleasant and cooperative,  no obvious depression or anxiety Lab Results  Component Value Date   WBC 8.6 12/21/2017   HGB 16.6 (H) 12/21/2017   HCT 48.9 (H) 12/21/2017   PLT 246 12/21/2017   GLUCOSE 112 (H) 12/21/2017   CHOL 109 04/30/2017   TRIG 58.0 04/30/2017   HDL 47.10 04/30/2017   LDLCALC 50 04/30/2017   ALT 21 04/30/2017   AST 20 04/30/2017   NA 138 12/21/2017   K 3.7 12/21/2017   CL 101 12/21/2017   CREATININE 0.67 12/21/2017   BUN 24 (H) 12/21/2017   CO2 25 12/21/2017   TSH 1.40 04/30/2017   INR 0.96 06/11/2011   BP Readings from Last 3 Encounters:  02/04/18 124/86  12/25/17 128/65  12/21/17 (!) 147/66   Wt Readings from Last 3 Encounters:  02/04/18 231 lb 11.2 oz (105.1 kg)  12/24/17 232 lb 3.2 oz (105.3 kg)  12/21/17 232 lb 3.2 oz (105.3 kg)     ASSESSMENT AND PLAN:  Discussed the following assessment and plan:  Thrush, oral most likely  Sore throat - Plan: POCT rapid strep A, Culture, Group A Strep  Respiratory tract congestion with cough Disc  intervention    Expectant management. Fu alarm sx   Pat requests cough med for night  Refill 90 cc. -Patient advised to return or notify health care team  if  new concerns arise.  Patient Instructions  Chest exam is clear  Suspect  Version of thrush    Use the lozenges 5 x per day for 2 weeks     Cough med as needed at night        Mariann Laster K. Mai Longnecker M.D.

## 2018-02-04 NOTE — Patient Instructions (Signed)
Chest exam is clear  Suspect  Version of thrush    Use the lozenges 5 x per day for 2 weeks     Cough med as needed at night

## 2018-02-06 LAB — CULTURE, GROUP A STREP
MICRO NUMBER:: 90294204
SPECIMEN QUALITY:: ADEQUATE

## 2018-03-12 ENCOUNTER — Encounter: Payer: Self-pay | Admitting: Obstetrics & Gynecology

## 2018-03-12 ENCOUNTER — Ambulatory Visit (INDEPENDENT_AMBULATORY_CARE_PROVIDER_SITE_OTHER): Admitting: Obstetrics & Gynecology

## 2018-03-12 ENCOUNTER — Other Ambulatory Visit: Payer: Self-pay

## 2018-03-12 VITALS — BP 150/100 | HR 72 | Resp 16 | Ht 59.25 in | Wt 234.0 lb

## 2018-03-12 DIAGNOSIS — Z01419 Encounter for gynecological examination (general) (routine) without abnormal findings: Secondary | ICD-10-CM

## 2018-03-12 MED ORDER — BETAMETHASONE DIPROPIONATE 0.05 % EX OINT: TOPICAL_OINTMENT | Freq: Two times a day (BID) | CUTANEOUS | 2 refills | Status: DC

## 2018-03-12 MED ORDER — BETAMETHASONE DIPROPIONATE 0.05 % EX CREA: TOPICAL_CREAM | Freq: Two times a day (BID) | CUTANEOUS | 0 refills | Status: DC

## 2018-03-12 NOTE — Progress Notes (Addendum)
62 y.o. G3P3 MarriedCaucasianF here for annual exam.  Having some neck pain today.  Released from neurosurgeon and has started exercising again.  Does have some left rotator cuff issues on the left.  Wondering if she has aggravated it.    Continues to have vulvar itching.  Did have biopsy in December.  Findings showed possible lichen planus.  Pathology discussed.  Treatment discussed.  Will start now as still symptomatic.    Denies vaginal bleeding.  Patient's last menstrual period was 02/12/2011.          Sexually active: No.  The current method of family planning is post menopausal status.    Exercising: Yes.    walking, yoga Smoker:  no  Health Maintenance: Pap:  03/09/17 Neg. HR HPV:neg History of abnormal Pap:  no MMG:  07/28/17 BIRADS1:neg Colonoscopy:  09/10/15 polyps. F/u 5 years  BMD:   Never TDaP:  2015 Pneumonia vaccine(s):  n/a Shingrix:  2018 Completed  Hep C testing: 03/05/16 Neg  Screening Labs: PCP   reports that she has never smoked. She has never used smokeless tobacco. She reports that she drinks about 1.2 oz of alcohol per week. She reports that she does not use drugs.  Past Medical History:  Diagnosis Date  . Arthritis    knees, back  . Cancer ([place])   . Carcinoid tumor of lung 2012   right found incidentally on chest xray eval for r/o vasxculitis   . Carpal tunnel syndrome, bilateral   . Coronary artery calcification 07/23/2016  . Double vision    history, no current problem  . Dysrhythmia    irregular occassional, no current problem  . HNP (herniated nucleus pulposus), cervical 01/22/2011   C6-C7  Has seen dr Vertell Limber for this and Dr Tonita Cong  Positional numbness in hands no weakness    . Hypertension   . Hypothyroidism   . 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 Obstructive sleep apnea 07/27/2014   NPSG 07/2014:  AHI 85/hr, optimal cpap 10cm.  Download 08/2014:  Good compliance, breakthru apnea on 10cm.  Changed to auto 5-12cm >> good control of AHI on f/u  download ONO on CPAP:      . SUI (stress urinary incontinence, female)    h/o    Past Surgical History:  Procedure Laterality Date  . ANTERIOR CERVICAL DECOMP/DISCECTOMY FUSION  12/24/2017   Procedure: Cervical five-six Cervical six-seven Anterior cervical decompression/discectomy/fusion;  Surgeon: Erline Levine, MD;  Location: [facility];  Service: Neurosurgery;;  . Wilmon Pali RELEASE Left 12/24/2017   Procedure: LEFT CARPAL TUNNEL RELEASE;  Surgeon: Erline Levine, MD;  Location: [facility];  Service: Neurosurgery;  Laterality: Left;  . CESAREAN SECTION     x 1 - twins  . COLONOSCOPY    . DILATATION & CURRETTAGE/HYSTEROSCOPY WITH RESECTOCOPE N/A 07/18/2013   Procedure: DILATATION & CURETTAGE/HYSTEROSCOPY WITH RESECTOCOPE;  Surgeon: Lyman Speller, MD;  Location: [facility];  Service: Gynecology;  Laterality: N/A;  mass resection  . Lung tumor removed     Rt lung carcinoid  . MOUTH SURGERY     pre-cancerous ulcer removed  . SHOULDER OPEN ROTATOR CUFF REPAIR  07/20/2010   Right  . WISDOM TOOTH EXTRACTION      Current Outpatient Medications  Medication Sig Dispense Refill  . aspirin EC 81 MG tablet Take 81 mg by mouth at bedtime.    [name] atorvastatin (LIPITOR) 40 MG tablet Take 1 tablet (40 mg total) by mouth daily. 90 tablet 3  . CALCIUM PO  Take 1 tablet by mouth at bedtime.     [name] diltiazem (CARDIZEM CD) 180 MG 24 hr capsule Take 1 capsule (180 mg total) by mouth daily. (Patient taking differently: Take 180 mg by mouth at bedtime. ) 90 capsule 3  . estradiol (ESTRACE) 0.1 MG/GM vaginal cream Apply small amount externally three times weekly as directed 42.5 g 1  . hydrochlorothiazide (MICROZIDE) 12.5 MG capsule TAKE 1 CAPSULE DAILY 90 capsule 0  . methocarbamol (ROBAXIN) 750 MG tablet Take 0.5 tablets (375 mg total) by mouth every 6 (six) hours as needed for muscle spasms. 60 tablet 1  . metoprolol succinate (TOPROL-XL) 50 MG 24 hr tablet Take 1 tablet (50 mg total) by mouth daily. 90 tablet 3   . Multiple Vitamins-Minerals (PRESERVISION AREDS 2 PO) Take 1 tablet by mouth 2 (two) times daily.    . naproxen sodium (ALEVE) 220 MG tablet Take 220 mg by mouth 2 (two) times daily as needed (for pain.).    [name] SYNTHROID 137 MCG tablet TAKE 1 TABLET DAILY 90 tablet 0  . triamcinolone ointment (KENALOG) 0.5 % Apply 1 application topically 2 (two) times daily. Can use for up to 7 days with flare. 60 g 3   No current facility-administered medications for this visit.     Family History  Problem Relation Age of Onset  . Stroke Mother        died age 20  . Hypertension Mother   . Coronary artery disease Father        died age 96  . Hypertension Father   . Parkinson's disease Brother   . Diabetes Brother   . Hypertension Brother   . Hypertension Maternal Uncle   . Colon cancer Neg Hx   . Rectal cancer Neg Hx   . Stomach cancer Neg Hx     Review of Systems  Constitutional:       Weight gain  Genitourinary:       Vaginal itching   Endo/Heme/Allergies:       Craving sweets   All other systems reviewed and are negative.   Exam:   BP (!) 150/100 (BP Location: Left Arm, Patient Position: Sitting, Cuff Size: Large)   Pulse 72   Resp 16   Ht 4' 11.25" (1.505 m)   Wt 234 lb (106.1 kg)   LMP 02/12/2011   BMI 46.86 kg/m    Height: 4' 11.25" (150.5 cm)  Ht Readings from Last 3 Encounters:  03/12/18 4' 11.25" (1.505 m)  12/24/17 5' (1.524 m)  12/21/17 5' (1.524 m)    General appearance: alert, cooperative and appears stated age Head: Normocephalic, without obvious abnormality, atraumatic Neck: no adenopathy, supple, symmetrical, trachea midline and thyroid normal to inspection and palpation Lungs: clear to auscultation bilaterally Breasts: normal appearance, no masses or tenderness Heart: regular rate and rhythm Abdomen: soft, non-tender; bowel sounds normal; no masses,  no organomegaly Extremities: extremities normal, atraumatic, no cyanosis or edema Skin: Skin color,  texture, turgor normal. No rashes or lesions Lymph nodes: Cervical, supraclavicular, and axillary nodes normal. No abnormal inguinal nodes palpated Neurologic: Grossly normal   Pelvic: External genitalia:  Two areas of erythemtous patches noted on inner labia minora bilaterally.  See picture.              Urethra:  normal appearing urethra with no masses, tenderness or lesions              Bartholins and Skenes: normal  Vagina: normal appearing vagina with normal color and discharge, no lesions              Cervix: no lesions              Pap taken: No. Bimanual Exam:  Uterus:  normal size, contour, position, consistency, mobility, non-tender              Adnexa: normal adnexa and no mass, fullness, tenderness               Rectovaginal: Confirms               Anus:  normal sphincter tone, no lesions    Chaperone was present for exam.  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  UTD. pap smear with neg HR HPV 03/09/17.  Pap smear not indicated this year. Betamethasone 0.05% ointment BID.  Recheck 4-6 weeks.   Plan BMD with MMG Colonoscopy screening is UTD Lab work UTD with Dr Regis Bill. return annually or prn

## 2018-03-28 ENCOUNTER — Other Ambulatory Visit: Payer: Self-pay | Admitting: Internal Medicine

## 2018-03-29 ENCOUNTER — Other Ambulatory Visit: Payer: Self-pay | Admitting: Internal Medicine

## 2018-04-06 ENCOUNTER — Telehealth: Payer: Self-pay | Admitting: Pulmonary Disease

## 2018-04-06 DIAGNOSIS — G4733 Obstructive sleep apnea (adult) (pediatric): Secondary | ICD-10-CM

## 2018-04-06 DIAGNOSIS — Z9989 Dependence on other enabling machines and devices: Principal | ICD-10-CM

## 2018-04-06 NOTE — Telephone Encounter (Signed)
I called [employer] & spoke to [place].  ONO order was faxed over on 05/18/17 but also order for change to cpap settings.  They just saw the cpap setting change order & not the ONO.  Since it has been over 6 months a new ONO order will need to be placed.

## 2018-04-06 NOTE — Telephone Encounter (Signed)
ONO ordered. Patient is aware.

## 2018-04-06 NOTE — Telephone Encounter (Signed)
Called and spoke with patient, she states that her ONO was never scheduled. I do see where it was placed but never scheduled.   [facility] can you help with this, thanks.

## 2018-04-12 ENCOUNTER — Encounter: Payer: Self-pay | Admitting: Pulmonary Disease

## 2018-04-19 ENCOUNTER — Encounter: Payer: Self-pay | Admitting: Obstetrics & Gynecology

## 2018-04-19 ENCOUNTER — Telehealth: Payer: Self-pay | Admitting: Obstetrics & Gynecology

## 2018-04-19 NOTE — Telephone Encounter (Signed)
Patient sent the following correspondence through [facility]. Routing to triage to assist patient with request.  ----- Message from [place], Generic sent at 04/19/2018 11:01 AM EDT -----    I'm sorry I have to ask but I don't see the answer in the visit notes or on my prescription label. How long was I supposed to use the ointment? Thanks.

## 2018-04-19 NOTE — Telephone Encounter (Signed)
If itching is gone, decrease to nightly but continue until she comes for follow-up.  Thanks.

## 2018-04-19 NOTE — Telephone Encounter (Signed)
Left detailed message, ok per current dpr. Advised as seen below per Dr. Sabra Heck, return call to office if any additional questions. Will close encounter.

## 2018-04-19 NOTE — Telephone Encounter (Signed)
Patient started betamethasone cream for breast and ointment for vagina bid on 03/12/18, has recheck scheduled for 5/31 with Dr. Sabra Heck. Patient is unclear how long to use Rx, 2 wks or until f/u?  Advised will review with Dr. Sabra Heck and return call.   Dr. Sabra Heck -please advise on length of time for betamethasone?

## 2018-04-30 ENCOUNTER — Ambulatory Visit (INDEPENDENT_AMBULATORY_CARE_PROVIDER_SITE_OTHER): Admitting: Obstetrics & Gynecology

## 2018-04-30 ENCOUNTER — Encounter: Payer: Self-pay | Admitting: Obstetrics & Gynecology

## 2018-04-30 VITALS — BP 146/86 | HR 76 | Resp 16 | Ht 59.25 in | Wt 233.6 lb

## 2018-04-30 DIAGNOSIS — L292 Pruritus vulvae: Secondary | ICD-10-CM

## 2018-04-30 DIAGNOSIS — L439 Lichen planus, unspecified: Secondary | ICD-10-CM | POA: Insufficient documentation

## 2018-04-30 NOTE — Progress Notes (Signed)
GYNECOLOGY  VISIT  CC:   Vulvar recheck  HPI: 62 y.o. G3P3 Married Caucasian female here for vulvar check.  Biopsy showed possible lichen planus.  Pt has questions about biopsy results.  Answered today.  Has been using steroid and itching has resolved.  Pleased with results.  Steroid tapering discussed.  She will continue with nightly use for 4 more weeks and then use twice weekly for one additional month.  Pt can increase to BID for a few days with flares.    GYNECOLOGIC HISTORY: Patient's last menstrual period was 02/12/2011. Contraception: post menopausal  Menopausal hormone therapy: none  Patient Active Problem List   Diagnosis Date Noted  . Cervical stenosis of spinal canal 12/24/2017  . Hyperlipidemia LDL goal <70 08/26/2017  . Heart palpitations 08/26/2017  . Hammertoe of right foot 01/06/2017  . Coronary artery calcification 07/23/2016  . Periodic limb movements of sleep 06/18/2016  . Atherosclerotic plaque 06/11/2016  . Piriformis syndrome of left side 10/24/2015  . Loss of transverse plantar arch of left foot 09/12/2015  . 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 Obstructive sleep apnea 07/27/2014  . Midline low back pain without sciatica 06/20/2014  . Polycythemia, secondary 03/22/2014  . Severe obesity (BMI >= 40) ([place]) 02/22/2014  . Elevated hemoglobin ([place]) 02/22/2014  . Left shoulder pain 02/04/2013  . 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 HNP (herniated nucleus pulposus), cervical 01/22/2011  . Visit for preventive health examination 01/22/2011  . PAIN IN JOINT, MULTIPLE SITES 01/21/2010  . Hypothyroidism 10/16/2008  . PALPITATIONS, RECURRENT 01/07/2008  . Essential hypertension 06/25/2007  . OSTEOARTHRITIS 06/25/2007    Past Medical History:  Diagnosis Date  . Arthritis    knees, back  . Cancer ([place])   . Carcinoid tumor of lung 2012   right found incidentally on chest xray eval for r/o vasxculitis   . Carpal tunnel syndrome, bilateral   . Coronary artery  calcification 07/23/2016  . Double vision    history, no current problem  . Dysrhythmia    irregular occassional, no current problem  . HNP (herniated nucleus pulposus), cervical 01/22/2011   C6-C7  Has seen dr Vertell Limber for this and Dr Tonita Cong  Positional numbness in hands no weakness    . Hypertension   . Hypothyroidism   . 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 Obstructive sleep apnea 07/27/2014   NPSG 07/2014:  AHI 85/hr, optimal cpap 10cm.  Download 08/2014:  Good compliance, breakthru apnea on 10cm.  Changed to auto 5-12cm >> good control of AHI on f/u download ONO on CPAP:      . SUI (stress urinary incontinence, female)    h/o    Past Surgical History:  Procedure Laterality Date  . ANTERIOR CERVICAL DECOMP/DISCECTOMY FUSION  12/24/2017   Procedure: Cervical five-six Cervical six-seven Anterior cervical decompression/discectomy/fusion;  Surgeon: Erline Levine, MD;  Location: [facility];  Service: Neurosurgery;;  . Wilmon Pali RELEASE Left 12/24/2017   Procedure: LEFT CARPAL TUNNEL RELEASE;  Surgeon: Erline Levine, MD;  Location: [facility];  Service: Neurosurgery;  Laterality: Left;  . CESAREAN SECTION     x 1 - twins  . COLONOSCOPY    . DILATATION & CURRETTAGE/HYSTEROSCOPY WITH RESECTOCOPE N/A 07/18/2013   Procedure: DILATATION & CURETTAGE/HYSTEROSCOPY WITH RESECTOCOPE;  Surgeon: Lyman Speller, MD;  Location: [facility];  Service: Gynecology;  Laterality: N/A;  mass resection  . Lung tumor removed     Rt lung carcinoid  . MOUTH SURGERY     pre-cancerous ulcer removed  .  SHOULDER OPEN ROTATOR CUFF REPAIR  07/20/2010   Right  . WISDOM TOOTH EXTRACTION      MEDS:   Current Outpatient Medications on File Prior to Visit  Medication Sig Dispense Refill  . aspirin EC 81 MG tablet Take 81 mg by mouth at bedtime.    [name] atorvastatin (LIPITOR) 40 MG tablet Take 1 tablet (40 mg total) by mouth daily. 90 tablet 3  . betamethasone dipropionate (DIPROLENE) 0.05 % ointment Apply topically 2  (two) times daily. 45 g 2  . CALCIUM PO Take 1 tablet by mouth at bedtime.     [name] diltiazem (CARDIZEM CD) 180 MG 24 hr capsule Take 1 capsule (180 mg total) by mouth daily. (Patient taking differently: Take 180 mg by mouth at bedtime. ) 90 capsule 3  . hydrochlorothiazide (MICROZIDE) 12.5 MG capsule Take 1 capsule (12.5 mg total) by mouth daily. PLEASE SCHEDULE ANNUAL VISIT 90 capsule 0  . metoprolol succinate (TOPROL-XL) 50 MG 24 hr tablet Take 1 tablet (50 mg total) by mouth daily. 90 tablet 3  . Multiple Vitamins-Minerals (PRESERVISION AREDS 2 PO) Take 1 tablet by mouth 2 (two) times daily.    . naproxen sodium (ALEVE) 220 MG tablet Take 220 mg by mouth 2 (two) times daily as needed (for pain.).    [name] SYNTHROID 137 MCG tablet TAKE 1 TABLET DAILY 90 tablet 0   No current facility-administered medications on file prior to visit.     ALLERGIES: Neomycin and Ace inhibitors  Family History  Problem Relation Age of Onset  . Stroke Mother        died age 86  . Hypertension Mother   . Coronary artery disease Father        died age 100  . Hypertension Father   . Parkinson's disease Brother   . Diabetes Brother   . Hypertension Brother   . Hypertension Maternal Uncle   . Colon cancer Neg Hx   . Rectal cancer Neg Hx   . Stomach cancer Neg Hx     SH:  Married, non smoker  Review of Systems  Genitourinary:       Loss of urine with cough or sneeze   All other systems reviewed and are negative.   PHYSICAL EXAMINATION:    BP (!) 146/86 (BP Location: Right Arm, Patient Position: Sitting, Cuff Size: Large)   Pulse 76   Resp 16   Ht 4' 11.25" (1.505 m)   Wt 233 lb 9.6 oz (106 kg)   LMP 02/12/2011   BMI 46.78 kg/m     General appearance: alert, cooperative and appears stated age Lymph:  No inguinal LAD noted Pelvic: External genitalia:  no lesions   Assessment: Vulvar itching with possible lichen planus on biopsy  Plan: She will taper steroid and use prn with flares.  Will plan  to repeat biopsy skin if there are any significant skin changes.    ~15 minutes spent with patient >50% of time was in face to face discussion of above.

## 2018-05-03 ENCOUNTER — Telehealth: Payer: Self-pay | Admitting: Pulmonary Disease

## 2018-05-03 NOTE — Telephone Encounter (Signed)
ONO with CPAP 04/12/18 >> test time 6 hrs 34 min.  Average SpO2 90%, low SpO2 82%.  Spent 43.5 min with SpO2 < 88%.   Please let her know that oxygen level was low.  She needs to have in lab CPAP titration arranged to determine if she needs adjustment in CPAP settings or have supplemental oxygen added to CPAP at night.  If she is okay with this, then please place order for CPAP titration.  Other option is to discuss in more detail at [facility] later this month.

## 2018-05-05 NOTE — Telephone Encounter (Signed)
lmtcb x1 for pt. 

## 2018-05-06 NOTE — Telephone Encounter (Signed)
Patient is returning call. CB is [phone]

## 2018-05-06 NOTE — Telephone Encounter (Signed)
Called and spoke with patient, I advised her of VS result note from ONO. Patient has asked that she sees VS first before doing another test. She states that she does not like having a bunch of testing done and would rather wait to talk to him in person about this. Verified patients ROV. Patient verbalized understanding. Nothing further needed.

## 2018-05-13 ENCOUNTER — Encounter: Payer: Self-pay | Admitting: Obstetrics & Gynecology

## 2018-05-13 ENCOUNTER — Telehealth: Payer: Self-pay | Admitting: Obstetrics & Gynecology

## 2018-05-13 ENCOUNTER — Other Ambulatory Visit: Payer: Self-pay

## 2018-05-13 ENCOUNTER — Ambulatory Visit (INDEPENDENT_AMBULATORY_CARE_PROVIDER_SITE_OTHER): Admitting: Obstetrics & Gynecology

## 2018-05-13 VITALS — BP 158/98 | HR 76 | Resp 16 | Ht 59.25 in | Wt 232.0 lb

## 2018-05-13 DIAGNOSIS — L292 Pruritus vulvae: Secondary | ICD-10-CM

## 2018-05-13 DIAGNOSIS — R3915 Urgency of urination: Secondary | ICD-10-CM | POA: Diagnosis not present

## 2018-05-13 DIAGNOSIS — N3090 Cystitis, unspecified without hematuria: Secondary | ICD-10-CM

## 2018-05-13 LAB — POCT URINALYSIS DIPSTICK
BILIRUBIN UA: NEGATIVE
Blood, UA: NEGATIVE
GLUCOSE UA: NEGATIVE
KETONES UA: NEGATIVE
Leukocytes, UA: NEGATIVE
Nitrite, UA: NEGATIVE
Protein, UA: NEGATIVE
Urobilinogen, UA: 0.2 E.U./dL
pH, UA: 7 (ref 5.0–8.0)

## 2018-05-13 MED ORDER — SULFAMETHOXAZOLE-TRIMETHOPRIM 800-160 MG PO TABS: 1.0000 | ORAL_TABLET | Freq: Two times a day (BID) | ORAL | 0 refills | Status: DC

## 2018-05-13 NOTE — Telephone Encounter (Signed)
Patient believes she has a bladder infection. No appointments for today or tomorrow.

## 2018-05-13 NOTE — Progress Notes (Signed)
GYNECOLOGY  VISIT  CC:   Urinary dysuria, urgency, frequency x 4 days  HPI: 62 y.o. G3P3 Married Caucasian female here for urgency, frequency, pain x 4 days.  Denies having blood in urine.  Has been using Azo for symptom relived.  Denies having any fever or back pain.  Pt had similar symptoms at the end of December and had an e coli positive urine at that time.  Culture had intermediate sensitivity to augmentin.  Reports vulvar itching is much better but having a little at the introitus.  Wants to know if ok to use steroid at that location.    GYNECOLOGIC HISTORY: Patient's last menstrual period was 02/12/2011. Contraception: post menopausal  Menopausal hormone therapy: none  Patient Active Problem List   Diagnosis Date Noted  . Lichen planus 50/08/3817  . Cervical stenosis of spinal canal 12/24/2017  . Hyperlipidemia LDL goal <70 08/26/2017  . Heart palpitations 08/26/2017  . Hammertoe of right foot 01/06/2017  . Coronary artery calcification 07/23/2016  . Periodic limb movements of sleep 06/18/2016  . Atherosclerotic plaque 06/11/2016  . Piriformis syndrome of left side 10/24/2015  . Loss of transverse plantar arch of left foot 09/12/2015  . 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 Obstructive sleep apnea 07/27/2014  . Midline low back pain without sciatica 06/20/2014  . Polycythemia, secondary 03/22/2014  . Severe obesity (BMI >= 40) ([place]) 02/22/2014  . Elevated hemoglobin ([place]) 02/22/2014  . Left shoulder pain 02/04/2013  . 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 HNP (herniated nucleus pulposus), cervical 01/22/2011  . Visit for preventive health examination 01/22/2011  . PAIN IN JOINT, MULTIPLE SITES 01/21/2010  . Hypothyroidism 10/16/2008  . PALPITATIONS, RECURRENT 01/07/2008  . Essential hypertension 06/25/2007  . OSTEOARTHRITIS 06/25/2007    Past Medical History:  Diagnosis Date  . Arthritis    knees, back  . Cancer ([place])   . Carcinoid tumor of lung 2012   right  found incidentally on chest xray eval for r/o vasxculitis   . Carpal tunnel syndrome, bilateral   . Coronary artery calcification 07/23/2016  . Double vision    history, no current problem  . Dysrhythmia    irregular occassional, no current problem  . HNP (herniated nucleus pulposus), cervical 01/22/2011   C6-C7  Has seen dr Vertell Limber for this and Dr Tonita Cong  Positional numbness in hands no weakness    . Hypertension   . Hypothyroidism   . 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 Obstructive sleep apnea 07/27/2014   NPSG 07/2014:  AHI 85/hr, optimal cpap 10cm.  Download 08/2014:  Good compliance, breakthru apnea on 10cm.  Changed to auto 5-12cm >> good control of AHI on f/u download ONO on CPAP:      . SUI (stress urinary incontinence, female)    h/o    Past Surgical History:  Procedure Laterality Date  . ANTERIOR CERVICAL DECOMP/DISCECTOMY FUSION  12/24/2017   Procedure: Cervical five-six Cervical six-seven Anterior cervical decompression/discectomy/fusion;  Surgeon: Erline Levine, MD;  Location: [facility];  Service: Neurosurgery;;  . Wilmon Pali RELEASE Left 12/24/2017   Procedure: LEFT CARPAL TUNNEL RELEASE;  Surgeon: Erline Levine, MD;  Location: [facility];  Service: Neurosurgery;  Laterality: Left;  . CESAREAN SECTION     x 1 - twins  . COLONOSCOPY    . DILATATION & CURRETTAGE/HYSTEROSCOPY WITH RESECTOCOPE N/A 07/18/2013   Procedure: DILATATION & CURETTAGE/HYSTEROSCOPY WITH RESECTOCOPE;  Surgeon: Lyman Speller, MD;  Location: [facility];  Service: Gynecology;  Laterality: N/A;  mass resection  .  Lung tumor removed     Rt lung carcinoid  . MOUTH SURGERY     pre-cancerous ulcer removed  . SHOULDER OPEN ROTATOR CUFF REPAIR  07/20/2010   Right  . WISDOM TOOTH EXTRACTION      MEDS:   Current Outpatient Medications on File Prior to Visit  Medication Sig Dispense Refill  . aspirin EC 81 MG tablet Take 81 mg by mouth at bedtime.    [name] atorvastatin (LIPITOR) 40 MG tablet Take 1 tablet (40  mg total) by mouth daily. 90 tablet 3  . betamethasone dipropionate (DIPROLENE) 0.05 % ointment Apply topically 2 (two) times daily. 45 g 2  . CALCIUM PO Take 1 tablet by mouth at bedtime.     [name] diltiazem (CARDIZEM CD) 180 MG 24 hr capsule Take 1 capsule (180 mg total) by mouth daily. (Patient taking differently: Take 180 mg by mouth at bedtime. ) 90 capsule 3  . hydrochlorothiazide (MICROZIDE) 12.5 MG capsule Take 1 capsule (12.5 mg total) by mouth daily. PLEASE SCHEDULE ANNUAL VISIT 90 capsule 0  . metoprolol succinate (TOPROL-XL) 50 MG 24 hr tablet Take 1 tablet (50 mg total) by mouth daily. 90 tablet 3  . Multiple Vitamins-Minerals (PRESERVISION AREDS 2 PO) Take 1 tablet by mouth 2 (two) times daily.    . naproxen sodium (ALEVE) 220 MG tablet Take 220 mg by mouth 2 (two) times daily as needed (for pain.).    [name] SYNTHROID 137 MCG tablet TAKE 1 TABLET DAILY 90 tablet 0   No current facility-administered medications on file prior to visit.     ALLERGIES: Neomycin and Ace inhibitors  Family History  Problem Relation Age of Onset  . Stroke Mother        died age 80  . Hypertension Mother   . Coronary artery disease Father        died age 28  . Hypertension Father   . Parkinson's disease Brother   . Diabetes Brother   . Hypertension Brother   . Hypertension Maternal Uncle   . Colon cancer Neg Hx   . Rectal cancer Neg Hx   . Stomach cancer Neg Hx     SH:  Married, non smoker  Review of Systems  Genitourinary: Positive for dysuria, frequency and urgency.       Night urination   All other systems reviewed and are negative.   PHYSICAL EXAMINATION:    BP (!) 158/98 (BP Location: Left Arm, Patient Position: Sitting, Cuff Size: Large)   Pulse 76   Resp 16   Ht 4' 11.25" (1.505 m)   Wt 232 lb (105.2 kg)   LMP 02/12/2011   BMI 46.46 kg/m     General appearance: alert, cooperative and appears stated age Flank:  No CVA tenderness Abdomen: soft, no suprapubic  tenderness  Pelvic: External genitalia:  no lesions, area of possible lichen planus is much more normal appearing today with minimal skin changes              Urethra:  normal appearing urethra with no masses, tenderness or lesions              Bartholins and Skenes: normal                 Vagina: normal appearing vagina with normal color and discharge, no lesions              Chaperone was present for exam.  Assessment: Urinary urgency H/O vulvar biopsy with possible lichen  planus  Plan: Urine culture pending.  Bactrim DS BID x 5 days sent to pharmacy (last UTI treated in December lingered and required re treatment so using 5 days instead of 3 days) Instructions about where to apply topical steroid given.   ~15 minutes spent with patient >50% of time was in face to face discussion of above.

## 2018-05-13 NOTE — Telephone Encounter (Signed)
Spoke with patient. Reports urinary urgency, incontinence, and pelvic pressure. Symptoms started 3 days ago. Taking Azo for symptom relief, took last dose at 10pm last night.  Denies blood in urine, lower back pain, fever/chills, vag d/c or odor.   Patient requesting OV, advised will review schedule with Dr. Sabra Heck and return call.

## 2018-05-13 NOTE — Telephone Encounter (Signed)
Reviewed with Dr. Sabra Heck, call returned to patient, OV scheduled for today at 11am. Patient verbalizes understanding and is agreeable. Encounter closed.

## 2018-05-14 LAB — URINALYSIS, MICROSCOPIC ONLY: CASTS: NONE SEEN /LPF

## 2018-05-15 LAB — URINE CULTURE

## 2018-05-17 NOTE — Addendum Note (Signed)
Addended by: Megan Salon on: 05/17/2018 07:27 AM   Modules accepted: Orders

## 2018-05-18 ENCOUNTER — Ambulatory Visit: Admitting: Pulmonary Disease

## 2018-05-21 ENCOUNTER — Encounter: Payer: Self-pay | Admitting: Pulmonary Disease

## 2018-05-21 ENCOUNTER — Ambulatory Visit (INDEPENDENT_AMBULATORY_CARE_PROVIDER_SITE_OTHER): Admitting: Pulmonary Disease

## 2018-05-21 VITALS — BP 138/80 | HR 76 | Ht 59.0 in | Wt 236.6 lb

## 2018-05-21 DIAGNOSIS — G4733 Obstructive sleep apnea (adult) (pediatric): Secondary | ICD-10-CM | POA: Diagnosis not present

## 2018-05-21 DIAGNOSIS — Z6841 Body Mass Index (BMI) 40.0 and over, adult: Secondary | ICD-10-CM

## 2018-05-21 DIAGNOSIS — D751 Secondary polycythemia: Secondary | ICD-10-CM

## 2018-05-21 NOTE — Progress Notes (Signed)
[facility]  Chief Complaint  Patient presents with  . Follow-up    follow up for OSA on CPAP. patient is having trouble sleeping but states she feels like the CPAP is doing its job.    Vital signs: BP 138/80 (BP Location: Left Arm, Cuff Size: Normal)   Pulse 76   Ht 4\' 11"  (1.499 m)   Wt 236 lb 9.6 oz (107.3 kg)   LMP 02/12/2011   SpO2 94%   BMI 47.79 kg/m   History of Present Illness: Nichole Smith is a 62 y.o. female with obstructive sleep apnea.  She had ONO on CPAP.  Showed low oxygen level.  She uses CPAP nightly.  No issues with mask fit.  She wakes up half way through the night, and then has trouble falling back to sleep.   Physical Exam:  General - pleasant Eyes - pupils reactive ENT - no sinus tenderness, no oral exudate, no LAN Cardiac - regular, no murmur Chest - no wheeze, rales Abd - soft, non tender Ext - no edema Skin - no rashes Neuro - normal strength Psych - normal mood  Assessment/Plan:  Obstructive sleep apnea. - will change to CPAP 16 cm H2O - will repeat ONO with CPAP on 16 cm H2O - concerned she might have hypoventilation/hypoxia contributing to her polycythemia - she is reluctant to do in lab study >> might need to if increasing CPAP doesn't help  Obesity. - discussed how her weight can be contributing to her breathing conditions   Patient Instructions  Will change CPAP to 16 cm H2O  Will arrange for repeat overnight oxygen test with CPAP 16 cm H2O  Follow up in 1 year    Chesley Mires, MD [place] [facility] 05/21/2018, 2:55 PM  Flow Sheet  Sleep tests: PSG 07/10/14 >> AHI 85 CPAP 04/18/17 to 05/17/17 >> used on 30 of 30 nights with average 7 hrs 44 min.  Average AHI 0.9 with CPAP 14 cm H2O ONO with CPAP 04/12/18 >> test time 6 hrs 34 min.  Average SpO2 90%, low SpO2 82%.  Spent 43.5 min with SpO2 < 88%.  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  Past Surgical History: She  has a past surgical history that includes Cesarean section; Shoulder open rotator cuff repair (07/20/2010); Lung tumor removed; Dilatation & currettage/hysteroscopy with resectoscope (N/A, 07/18/2013); Wisdom tooth extraction; Mouth surgery; Colonoscopy; Anterior cervical decomp/discectomy fusion (12/24/2017); and Carpal tunnel release (Left, 12/24/2017).  Family History: Her family history includes Coronary artery disease in her father; Diabetes in her brother; Hypertension in her brother, father, maternal uncle, and mother; Parkinson's disease in her brother; Stroke in her mother.  Social History: She  reports that she has never smoked. She has never used smokeless tobacco. She reports that she drinks about 1.2 oz of alcohol per week. She reports that she does not use drugs.  Medications: Allergies as of 05/21/2018      Reactions   Neomycin Other (See Comments)   Inflammation--eye drop   Ace Inhibitors Cough      Medication List        Accurate as of 05/21/18  2:55 PM. Always use your most recent med list.          aspirin EC 81 MG tablet Take 81 mg by mouth at bedtime.   atorvastatin 40 MG tablet Commonly known as:  LIPITOR Take 1 tablet (40 mg total) by mouth daily.   betamethasone dipropionate 0.05 % ointment Commonly known as:  DIPROLENE Apply topically 2 (two) times daily.   CALCIUM PO Take 1 tablet by mouth at bedtime.   diltiazem 180 MG 24 hr capsule Commonly known as:  CARDIZEM CD Take 1 capsule (180 mg total) by mouth  daily.   hydrochlorothiazide 12.5 MG capsule Commonly known as:  MICROZIDE Take 1 capsule (12.5 mg total) by mouth daily. PLEASE SCHEDULE ANNUAL VISIT   metoprolol succinate 50 MG 24 hr tablet Commonly known as:  TOPROL-XL Take 1 tablet (50 mg total) by mouth daily.   naproxen sodium 220 MG tablet Commonly known as:  ALEVE Take 220 mg by mouth 2 (two) times daily as needed (for pain.).   PRESERVISION AREDS 2 PO Take 1 tablet by mouth 2 (two) times daily.   sulfamethoxazole-trimethoprim 800-160 MG tablet Commonly known as:  BACTRIM DS,SEPTRA DS Take 1 tablet by mouth 2 (two) times daily.   SYNTHROID 137 MCG tablet Generic drug:  levothyroxine TAKE 1 TABLET DAILY

## 2018-05-21 NOTE — Patient Instructions (Signed)
Will change CPAP to 16 cm H2O  Will arrange for repeat overnight oxygen test with CPAP 16 cm H2O  Follow up in 1 year

## 2018-05-24 ENCOUNTER — Ambulatory Visit (INDEPENDENT_AMBULATORY_CARE_PROVIDER_SITE_OTHER): Admitting: *Deleted

## 2018-05-24 ENCOUNTER — Other Ambulatory Visit: Payer: Self-pay

## 2018-05-24 DIAGNOSIS — N3090 Cystitis, unspecified without hematuria: Secondary | ICD-10-CM | POA: Diagnosis not present

## 2018-05-24 NOTE — Progress Notes (Signed)
Patient here for TOC urine culture. Patient states symptoms have resolved and she completed bactrim. Patient aware our office will call with results as soon as Dr. Sabra Heck receives and reviews. Patient agreeable. Clean catch urine sent for culture.   Routing to provider for final review. Patient agreeable to disposition. Will close encounter.

## 2018-05-25 ENCOUNTER — Ambulatory Visit: Admitting: Internal Medicine

## 2018-05-26 LAB — URINE CULTURE

## 2018-05-27 NOTE — Progress Notes (Signed)
Chief Complaint  Patient presents with  . Medication Refill    Pt present for Rx refills.     HPI: Nichole Smith 62 y.o. come in for Chronic disease management  Lat pv  5 21  Sees dr Sabra Heck for [facility]  Has a list and med fu concerns    Labs and chest  X ray .  Miller did physical   bp is 140/80   And lower . At home  Back on b blocker and diltiazem and apparently had inc bp after  Dec metfoprolol  No brady ?    HLD: on atrova  For  Imaging sx and  Pos apolipoprotein b   Recurring bladder infection and   Has   Vaginal   lichen  Planus .  Dex per dr Sabra Heck     Cough at night at times :    Has come back  Off and on     Due for c  Xray s/p carcinoid  .  Concern about   Lack of effect of osa rx for high hg  And  Apparently low pulse ox overnight tests.   Frustrated about having to  Take so manu meds intervention etc   S/p cervical  Surgery "cts" went away.   tryin gsome of diet that daughter is trying to lose weight   ROS: See pertinent positives and negatives per HPI.  Past Medical History:  Diagnosis Date  . Arthritis    knees, back  . Cancer ([place])   . Carcinoid tumor of lung 2012   right found incidentally on chest xray eval for r/o vasxculitis   . Carpal tunnel syndrome, bilateral   . Coronary artery calcification 07/23/2016  . Double vision    history, no current problem  . Dysrhythmia    irregular occassional, no current problem  . HNP (herniated nucleus pulposus), cervical 01/22/2011   C6-C7  Has seen dr Vertell Limber for this and Dr Tonita Cong  Positional numbness in hands no weakness    . Hypertension   . Hypothyroidism   . Leukocytoclastic vasculitis ([place]) 05/26/2011  . Obstructive sleep apnea 07/27/2014   NPSG 07/2014:  AHI 85/hr, optimal cpap 10cm.  Download 08/2014:  Good compliance, breakthru apnea on 10cm.  Changed to auto 5-12cm >> good control of AHI on f/u download ONO on CPAP:      . SUI (stress urinary incontinence, female)    h/o    Family History  Problem  Relation Age of Onset  . Stroke Mother        died age 15  . Hypertension Mother   . Coronary artery disease Father        died age 4  . Hypertension Father   . Parkinson's disease Brother   . Diabetes Brother   . Hypertension Brother   . Hypertension Maternal Uncle   . Colon cancer Neg Hx   . Rectal cancer Neg Hx   . Stomach cancer Neg Hx     Social History   Socioeconomic History  . Marital status: Married    Spouse name: Not on file  . Number of children: 3  . Years of education: Not on file  . Highest education level: Not on file  Occupational History  . Occupation: retied    Fish farm manager: [employer]  Social Needs  . Financial resource strain: Not on file  . Food insecurity:    Worry: Not on file    Inability: Not on file  .  Transportation needs:    Medical: Not on file    Non-medical: Not on file  Tobacco Use  . Smoking status: Never Smoker  . Smokeless tobacco: Never Used  Substance and Sexual Activity  . Alcohol use: Yes    Alcohol/week: 1.2 oz    Types: 2 Glasses of wine per week    Comment: occasional  . Drug use: No  . Sexual activity: Yes    Partners: Male    Birth control/protection: None, Post-menopausal  Lifestyle  . Physical activity:    Days per week: Not on file    Minutes per session: Not on file  . Stress: Not on file  Relationships  . Social connections:    Talks on phone: Not on file    Gets together: Not on file    Attends religious service: Not on file    Active member of club or organization: Not on file    Attends meetings of clubs or organizations: Not on file    Relationship status: Not on file  Other Topics Concern  . Not on file  Social History Narrative   hhof 2    2 Children at college and  beyond      [name]    Married    Retired age 16 2015    Outpatient Medications Prior to Visit  Medication Sig Dispense Refill  . aspirin EC 81 MG tablet Take 81 mg by mouth at bedtime.    [name]  atorvastatin (LIPITOR) 40 MG tablet Take 1 tablet (40 mg total) by mouth daily. 90 tablet 3  . betamethasone dipropionate (DIPROLENE) 0.05 % ointment Apply topically 2 (two) times daily. 45 g 2  . CALCIUM PO Take 1 tablet by mouth at bedtime.     [name] diltiazem (CARDIZEM CD) 180 MG 24 hr capsule Take 1 capsule (180 mg total) by mouth daily. (Patient taking differently: Take 180 mg by mouth at bedtime. ) 90 capsule 3  . hydrochlorothiazide (MICROZIDE) 12.5 MG capsule Take 1 capsule (12.5 mg total) by mouth daily. PLEASE SCHEDULE ANNUAL VISIT 90 capsule 0  . metoprolol succinate (TOPROL-XL) 50 MG 24 hr tablet Take 1 tablet (50 mg total) by mouth daily. 90 tablet 3  . Multiple Vitamins-Minerals (PRESERVISION AREDS 2 PO) Take 1 tablet by mouth 2 (two) times daily.    . naproxen sodium (ALEVE) 220 MG tablet Take 220 mg by mouth 2 (two) times daily as needed (for pain.).    [name] SYNTHROID 137 MCG tablet TAKE 1 TABLET DAILY 90 tablet 0   No facility-administered medications prior to visit.      EXAM:  BP (!) 142/80 (BP Location: Right Wrist, Patient Position: Sitting, Cuff Size: Normal)   Pulse 66   Temp 98.4 F (36.9 C) (Oral)   Wt 233 lb (105.7 kg)   LMP 02/12/2011   SpO2 95%   BMI 47.06 kg/m   Body mass index is 47.06 kg/m.  GENERAL: vitals reviewed and listed above, alert, oriented, appears well hydrated and in no acute distress HEENT: atraumatic, conjunctiva  clear, no obvious abnormalities on inspection of external nose and ears tm clear  OP : no lesion edema or exudate  NECK: no obvious masses on inspection palpation  LUNGS: clear to auscultation bilaterally, no wheezes, rales or rhonchi, good air movement CV: HRRR, no clubbing cyanosis or  peripheral edema nl cap refill  Abdomen:  Sof,t normal bowel sounds without hepatosplenomegaly, no guarding rebound or masses no CVA tenderness MS: moves all  extremities without noticeable focal  abnormality PSYCH: pleasant and cooperative, no  obvious depression or anxiety Lab Results  Component Value Date   WBC 8.6 12/21/2017   HGB 16.6 (H) 12/21/2017   HCT 48.9 (H) 12/21/2017   PLT 246 12/21/2017   GLUCOSE 112 (H) 12/21/2017   CHOL 109 04/30/2017   TRIG 58.0 04/30/2017   HDL 47.10 04/30/2017   LDLCALC 50 04/30/2017   ALT 21 04/30/2017   AST 20 04/30/2017   NA 138 12/21/2017   K 3.7 12/21/2017   CL 101 12/21/2017   CREATININE 0.67 12/21/2017   BUN 24 (H) 12/21/2017   CO2 25 12/21/2017   TSH 1.40 04/30/2017   INR 0.96 06/11/2011   BP Readings from Last 3 Encounters:  05/28/18 (!) 142/80  05/24/18 (!) 146/82  05/21/18 138/80    ASSESSMENT AND PLAN:  Discussed the following assessment and plan:  Essential hypertension - Plan: Basic metabolic panel, Hemoglobin A1c, CANCELED: Basic metabolic panel, CANCELED: CBC with Differential/Platelet, CANCELED: Hepatic function panel, CANCELED: Lipid panel, CANCELED: TSH, CANCELED: Hemoglobin A1c  Medication management - Plan: CANCELED: Basic metabolic panel, CANCELED: CBC with Differential/Platelet, CANCELED: Hepatic function panel, CANCELED: Lipid panel, CANCELED: TSH, CANCELED: Hemoglobin A1c  Thyroiditis, autoimmune - Plan: TSH, CANCELED: Basic metabolic panel, CANCELED: CBC with Differential/Platelet, CANCELED: Hepatic function panel, CANCELED: Lipid panel, CANCELED: TSH, CANCELED: Hemoglobin A1c  Hyperlipidemia, unspecified hyperlipidemia type - Plan: Lipid panel, CANCELED: Basic metabolic panel, CANCELED: CBC with Differential/Platelet, CANCELED: Hepatic function panel, CANCELED: Lipid panel, CANCELED: TSH, CANCELED: Hemoglobin A1c  Obstructive sleep apnea - Plan: CANCELED: Basic metabolic panel, CANCELED: CBC with Differential/Platelet, CANCELED: Hepatic function panel, CANCELED: Lipid panel, CANCELED: TSH, CANCELED: Hemoglobin A1c  Carcinoid tumor of lung - Plan: DG Chest 2 View, Hepatic function panel, CANCELED: Basic metabolic panel, CANCELED: CBC with  Differential/Platelet, CANCELED: Hepatic function panel, CANCELED: Lipid panel, CANCELED: TSH, CANCELED: Hemoglobin A1c  Cough, persistent - Plan: DG Chest 2 View, CBC with Differential/Platelet, CANCELED: Basic metabolic panel, CANCELED: CBC with Differential/Platelet, CANCELED: Hepatic function panel, CANCELED: Lipid panel, CANCELED: TSH, CANCELED: Hemoglobin A1c  BMI 45.0-49.9, adult (HCC)  Atherosclerotic plaque  Polycythemia, secondary  Hyperlipidemia LDL goal <70 Due labs monitoring  Morbid obesity  She is on b block and ccp ? May be different med   Combo ( has been off amlodipine changes)  consider change  to carvedilol or  Other  Weight loss will be helpful Will review  Record for help/ and plan fu   Cough med  requested at night to use prn   Aware of   Controlled  -Patient advised to return or notify health care team  if  new concerns arise.  Patient Instructions    Make sure  BP is at goal will  review record about all the other issues .   Will notify you  of labs when available.   After sleep  Changes .       Standley Brooking. Daxton Nydam M.D.

## 2018-05-28 ENCOUNTER — Encounter: Payer: Self-pay | Admitting: Internal Medicine

## 2018-05-28 ENCOUNTER — Ambulatory Visit (INDEPENDENT_AMBULATORY_CARE_PROVIDER_SITE_OTHER): Admitting: Internal Medicine

## 2018-05-28 VITALS — BP 142/80 | HR 66 | Temp 98.4°F | Wt 233.0 lb

## 2018-05-28 DIAGNOSIS — E785 Hyperlipidemia, unspecified: Secondary | ICD-10-CM

## 2018-05-28 DIAGNOSIS — I10 Essential (primary) hypertension: Secondary | ICD-10-CM | POA: Diagnosis not present

## 2018-05-28 DIAGNOSIS — Z6841 Body Mass Index (BMI) 40.0 and over, adult: Secondary | ICD-10-CM

## 2018-05-28 DIAGNOSIS — I7090 Unspecified atherosclerosis: Secondary | ICD-10-CM

## 2018-05-28 DIAGNOSIS — D3A090 Benign carcinoid tumor of the bronchus and lung: Secondary | ICD-10-CM | POA: Diagnosis not present

## 2018-05-28 DIAGNOSIS — E063 Autoimmune thyroiditis: Secondary | ICD-10-CM

## 2018-05-28 DIAGNOSIS — D751 Secondary polycythemia: Secondary | ICD-10-CM

## 2018-05-28 DIAGNOSIS — R053 Chronic cough: Secondary | ICD-10-CM

## 2018-05-28 DIAGNOSIS — Z79899 Other long term (current) drug therapy: Secondary | ICD-10-CM

## 2018-05-28 DIAGNOSIS — G4733 Obstructive sleep apnea (adult) (pediatric): Secondary | ICD-10-CM

## 2018-05-28 DIAGNOSIS — R05 Cough: Secondary | ICD-10-CM

## 2018-05-28 MED ORDER — HYDROCODONE-HOMATROPINE 5-1.5 MG/5ML PO SYRP: 5.0000 mL | ORAL_SOLUTION | Freq: Four times a day (QID) | ORAL | 0 refills | Status: AC | PRN

## 2018-05-28 NOTE — Patient Instructions (Addendum)
   Make sure  BP is at goal will  review record about all the other issues .   Will notify you  of labs when available.   After sleep  Changes .

## 2018-06-01 ENCOUNTER — Encounter: Payer: Self-pay | Admitting: Internal Medicine

## 2018-06-01 NOTE — Telephone Encounter (Signed)
Please advise Dr Regis Bill of another cough syrup that can be called in or OTC meds similar -- Delsym, Robitussin, etc... Thanks.

## 2018-06-02 MED ORDER — PROMETHAZINE-DM 6.25-15 MG/5ML PO SYRP: 5.0000 mL | ORAL_SOLUTION | Freq: Four times a day (QID) | ORAL | 0 refills | Status: DC | PRN

## 2018-06-02 NOTE — Telephone Encounter (Signed)
Not sure what to give  Sent in  premeth with dm  To try

## 2018-06-07 ENCOUNTER — Other Ambulatory Visit (INDEPENDENT_AMBULATORY_CARE_PROVIDER_SITE_OTHER)

## 2018-06-07 DIAGNOSIS — R053 Chronic cough: Secondary | ICD-10-CM

## 2018-06-07 DIAGNOSIS — D3A090 Benign carcinoid tumor of the bronchus and lung: Secondary | ICD-10-CM

## 2018-06-07 DIAGNOSIS — I10 Essential (primary) hypertension: Secondary | ICD-10-CM

## 2018-06-07 DIAGNOSIS — E063 Autoimmune thyroiditis: Secondary | ICD-10-CM | POA: Diagnosis not present

## 2018-06-07 DIAGNOSIS — E785 Hyperlipidemia, unspecified: Secondary | ICD-10-CM

## 2018-06-07 DIAGNOSIS — R05 Cough: Secondary | ICD-10-CM

## 2018-06-07 LAB — CBC WITH DIFFERENTIAL/PLATELET
BASOS ABS: 0 10*3/uL (ref 0.0–0.1)
BASOS PCT: 0.6 % (ref 0.0–3.0)
EOS ABS: 0.2 10*3/uL (ref 0.0–0.7)
Eosinophils Relative: 2.5 % (ref 0.0–5.0)
HCT: 44.7 % (ref 36.0–46.0)
Hemoglobin: 15.4 g/dL — ABNORMAL HIGH (ref 12.0–15.0)
LYMPHS ABS: 2.6 10*3/uL (ref 0.7–4.0)
Lymphocytes Relative: 35.1 % (ref 12.0–46.0)
MCHC: 34.3 g/dL (ref 30.0–36.0)
MCV: 91.9 fl (ref 78.0–100.0)
MONO ABS: 0.6 10*3/uL (ref 0.1–1.0)
Monocytes Relative: 8.5 % (ref 3.0–12.0)
NEUTROS ABS: 3.9 10*3/uL (ref 1.4–7.7)
Neutrophils Relative %: 53.3 % (ref 43.0–77.0)
Platelets: 228 10*3/uL (ref 150.0–400.0)
RBC: 4.86 Mil/uL (ref 3.87–5.11)
RDW: 13.2 % (ref 11.5–15.5)
WBC: 7.3 10*3/uL (ref 4.0–10.5)

## 2018-06-07 LAB — HEPATIC FUNCTION PANEL
ALBUMIN: 3.6 g/dL (ref 3.5–5.2)
ALK PHOS: 68 U/L (ref 39–117)
ALT: 23 U/L (ref 0–35)
AST: 18 U/L (ref 0–37)
BILIRUBIN DIRECT: 0.1 mg/dL (ref 0.0–0.3)
TOTAL PROTEIN: 6.3 g/dL (ref 6.0–8.3)
Total Bilirubin: 0.6 mg/dL (ref 0.2–1.2)

## 2018-06-07 LAB — BASIC METABOLIC PANEL
BUN: 20 mg/dL (ref 6–23)
CALCIUM: 9.1 mg/dL (ref 8.4–10.5)
CO2: 33 meq/L — AB (ref 19–32)
CREATININE: 0.73 mg/dL (ref 0.40–1.20)
Chloride: 101 mEq/L (ref 96–112)
GFR: 85.37 mL/min (ref >60.00)
GLUCOSE: 100 mg/dL — AB (ref 70–99)
Potassium: 4 mEq/L (ref 3.5–5.1)
Sodium: 141 mEq/L (ref 135–145)

## 2018-06-07 LAB — LIPID PANEL
CHOLESTEROL: 115 mg/dL (ref 0–200)
HDL: 50.1 mg/dL (ref >39.00)
LDL Cholesterol: 54 mg/dL (ref 0–99)
NonHDL: 64.64
Total CHOL/HDL Ratio: 2
Triglycerides: 54 mg/dL (ref 0.0–149.0)
VLDL: 10.8 mg/dL (ref 0.0–40.0)

## 2018-06-07 LAB — TSH: TSH: 3.66 u[IU]/mL (ref 0.35–4.50)

## 2018-06-07 LAB — HEMOGLOBIN A1C: Hgb A1c MFr Bld: 5.9 % (ref 4.6–6.5)

## 2018-06-08 ENCOUNTER — Encounter: Payer: Self-pay | Admitting: Pulmonary Disease

## 2018-06-21 ENCOUNTER — Encounter: Payer: Self-pay | Admitting: Internal Medicine

## 2018-06-21 ENCOUNTER — Ambulatory Visit (INDEPENDENT_AMBULATORY_CARE_PROVIDER_SITE_OTHER): Admitting: Internal Medicine

## 2018-06-21 VITALS — BP 144/90 | HR 82 | Temp 98.1°F | Wt 235.5 lb

## 2018-06-21 DIAGNOSIS — R0981 Nasal congestion: Secondary | ICD-10-CM | POA: Diagnosis not present

## 2018-06-21 DIAGNOSIS — A6920 Lyme disease, unspecified: Secondary | ICD-10-CM

## 2018-06-21 MED ORDER — DOXYCYCLINE HYCLATE 100 MG PO CAPS: 100.0000 mg | ORAL_CAPSULE | Freq: Two times a day (BID) | ORAL | 0 refills | Status: DC

## 2018-06-21 NOTE — Patient Instructions (Signed)
  This acts like EM the rash with   Lyme . ( erythema migrans)   empiric treatment as discussed . 10 - 14 days prefer  14 .

## 2018-06-21 NOTE — Progress Notes (Signed)
Chief Complaint  Patient presents with  . Insect Bite    Pt thinks she may have gotten bitten by a tick, spider or insect on her right thigh near her knee. Pt c/o itching, burning, pain and redness to the area for about 4 weeks. Seems to spread up her leg. Dull headache and feeling bad for the last 2-3 weeks - worse over the last 3 days. Very Large "bullseye" appearance measuring 10inch across.    HPI: Nichole Smith 62 y.o. come in for sda .  Didn't see bite but out in garden a lot .  Very itchy   Some pain at beginning  And now getting better but      Rash was less sx but nurse at church advised getting checked   Is now 3+ weeks  And rah is larger but less sx .  No outside travel no fever  Arthritis .  Has sinus sx also   going to beach  Soon .   Bigger than was but no better ROS: See pertinent positives and negatives per HPI.  Past Medical History:  Diagnosis Date  . Arthritis    knees, back  . Cancer ([place])   . Carcinoid tumor of lung 2012   right found incidentally on chest xray eval for r/o vasxculitis   . Carpal tunnel syndrome, bilateral   . Coronary artery calcification 07/23/2016  . Double vision    history, no current problem  . Dysrhythmia    irregular occassional, no current problem  . HNP (herniated nucleus pulposus), cervical 01/22/2011   C6-C7  Has seen dr Vertell Limber for this and Dr Tonita Cong  Positional numbness in hands no weakness    . Hypertension   . Hypothyroidism   . Leukocytoclastic vasculitis ([place]) 05/26/2011  . Obstructive sleep apnea 07/27/2014   NPSG 07/2014:  AHI 85/hr, optimal cpap 10cm.  Download 08/2014:  Good compliance, breakthru apnea on 10cm.  Changed to auto 5-12cm >> good control of AHI on f/u download ONO on CPAP:      . SUI (stress urinary incontinence, female)    h/o    Family History  Problem Relation Age of Onset  . Stroke Mother        died age 16  . Hypertension Mother   . Coronary artery disease Father        died age 2  .  Hypertension Father   . Parkinson's disease Brother   . Diabetes Brother   . Hypertension Brother   . Hypertension Maternal Uncle   . Colon cancer Neg Hx   . Rectal cancer Neg Hx   . Stomach cancer Neg Hx     Social History   Socioeconomic History  . Marital status: Married    Spouse name: Not on file  . Number of children: 3  . Years of education: Not on file  . Highest education level: Not on file  Occupational History  . Occupation: retied    Fish farm manager: [employer]  Social Needs  . Financial resource strain: Not on file  . Food insecurity:    Worry: Not on file    Inability: Not on file  . Transportation needs:    Medical: Not on file    Non-medical: Not on file  Tobacco Use  . Smoking status: Never Smoker  . Smokeless tobacco: Never Used  Substance and Sexual Activity  . Alcohol use: Yes    Alcohol/week: 1.2 oz    Types: 2  Glasses of wine per week    Comment: occasional  . Drug use: No  . Sexual activity: Yes    Partners: Male    Birth control/protection: None, Post-menopausal  Lifestyle  . Physical activity:    Days per week: Not on file    Minutes per session: Not on file  . Stress: Not on file  Relationships  . Social connections:    Talks on phone: Not on file    Gets together: Not on file    Attends religious service: Not on file    Active member of club or organization: Not on file    Attends meetings of clubs or organizations: Not on file    Relationship status: Not on file  Other Topics Concern  . Not on file  Social History Narrative   hhof 2    2 Children at college and  beyond      Nichole Smith    Married    Retired age 57 2015    Outpatient Medications Prior to Visit  Medication Sig Dispense Refill  . aspirin EC 81 MG tablet Take 81 mg by mouth at bedtime.    [name] atorvastatin (LIPITOR) 40 MG tablet Take 1 tablet (40 mg total) by mouth daily. 90 tablet 3  . betamethasone dipropionate (DIPROLENE) 0.05 % ointment  Apply topically 2 (two) times daily. 45 g 2  . CALCIUM PO Take 1 tablet by mouth at bedtime.     [name] diltiazem (CARDIZEM CD) 180 MG 24 hr capsule Take 1 capsule (180 mg total) by mouth daily. (Patient taking differently: Take 180 mg by mouth at bedtime. ) 90 capsule 3  . hydrochlorothiazide (MICROZIDE) 12.5 MG capsule Take 1 capsule (12.5 mg total) by mouth daily. PLEASE SCHEDULE ANNUAL VISIT 90 capsule 0  . loratadine (CLARITIN) 10 MG tablet Take 10 mg by mouth daily.    . metoprolol succinate (TOPROL-XL) 50 MG 24 hr tablet Take 1 tablet (50 mg total) by mouth daily. 90 tablet 3  . Multiple Vitamins-Minerals (PRESERVISION AREDS 2 PO) Take 1 tablet by mouth 2 (two) times daily.    . naproxen sodium (ALEVE) 220 MG tablet Take 220 mg by mouth 2 (two) times daily as needed (for pain.).    [name] promethazine-dextromethorphan (PROMETHAZINE-DM) 6.25-15 MG/5ML syrup Take 5 mLs by mouth 4 (four) times daily as needed for cough. 118 mL 0  . SYNTHROID 137 MCG tablet TAKE 1 TABLET DAILY 90 tablet 0   No facility-administered medications prior to visit.      EXAM:  BP (!) 144/90 (BP Location: Right Arm, Patient Position: Sitting, Cuff Size: Large)   Pulse 82   Temp 98.1 F (36.7 C) (Oral)   Wt 235 lb 8 oz (106.8 kg)   LMP 02/12/2011   BMI 47.57 kg/m   Body mass index is 47.57 kg/m.  GENERAL: vitals reviewed and listed above, alert, oriented, appears well hydrated and in no acute distress HEENT: atraumatic, conjunctiva  clear, no obvious abnormalities on inspection of external nose and ears NECK: no obvious masses on inspection palpation  LUNGS: clear to auscultation bilaterally, no wheezes, rales or rhonchi, good air movement CV: HRRR, no clubbing cyanosis or  peripheral edema nl cap refill  MS: moves all extremities without noticeable focal  Abnormality SKIN:  Large bullseye rash with centr irritation but no necrosis Fmb or tenderness   No adenopathy or acute joint swelling  PSYCH: pleasant and  cooperative, no obvious depression or anxiety  BP Readings from Last 3 Encounters:  06/21/18 (!) 144/90  05/28/18 (!) 142/80  05/24/18 (!) 146/82    ASSESSMENT AND PLAN:  Discussed the following assessment and plan:  Erythema migrans (Lyme disease)  Sinus congestion Highly suspicious ofr EM    Expectant management. And limitation of any serology not helpful because  treatment should begin .  Advise  2 weeks doxycycline  Bid  ( would cover sinusitis if needed also ) caution with sun sensitivity se etc  -Patient advised to return or notify health care team  if  new concerns arise.  Patient Instructions   This acts like EM the rash with   Lyme . ( erythema migrans)   empiric treatment as discussed . 10 - 14 days prefer  14 .        Standley Brooking. [place] M.D.

## 2018-06-22 ENCOUNTER — Encounter: Payer: Self-pay | Admitting: Pulmonary Disease

## 2018-06-22 NOTE — Telephone Encounter (Signed)
I have check Dr. Juanetta Gosling look at's, these results are in the cubby.

## 2018-06-22 NOTE — Telephone Encounter (Signed)
Received ONO that was performed on 7.9.2019. This has been placed in VS' look ats to review.  Will forward to [facility] and Dr. Halford Chessman to follow.

## 2018-06-22 NOTE — Telephone Encounter (Signed)
Called [facility] and spoke to [place] and was advised pt had a ONO on 06/08/2018. She is faxing this to the triage fax now. Will hold in triage to f/u.

## 2018-06-22 NOTE — Telephone Encounter (Signed)
I have not received ONO.  Please contact her DME to have this faxed over.

## 2018-06-28 ENCOUNTER — Other Ambulatory Visit: Payer: Self-pay | Admitting: Internal Medicine

## 2018-06-29 ENCOUNTER — Telehealth: Payer: Self-pay | Admitting: Pulmonary Disease

## 2018-06-29 NOTE — Telephone Encounter (Signed)
ONO with CPAP 06/08/18 >> test time 8 hrs 42 min.  Basal SpO2 93.6%, low SpO2 81%.  Spent 4.3 min with SpO2 < 88%.   Please let her know that oxygen level looked good with current CPAP settings.  She does not need supplemental oxygen at night.  She should continue using CPAP at night.

## 2018-06-29 NOTE — Telephone Encounter (Signed)
Called and spoke with patient regarding results.  Informed the patient of results and recommendations today. Pt verbalized understanding and denied any questions or concerns at this time.  Nothing further needed.  

## 2018-07-05 ENCOUNTER — Other Ambulatory Visit: Payer: Self-pay | Admitting: Internal Medicine

## 2018-07-07 ENCOUNTER — Encounter: Payer: Self-pay | Admitting: Pulmonary Disease

## 2018-07-09 NOTE — Telephone Encounter (Signed)
Can send script for trazodone 50 mg qhs prn.  She can start with 50 mg, and if she still has trouble sleeping then she can increase dose to 100 mg qhs prn.  Dispense 60 pills with 5 refills.

## 2018-07-13 NOTE — Telephone Encounter (Signed)
Please send script for trazodone 50 mg.  She can start 50 mg qhs and increase to 100 mg qhs as needed.

## 2018-07-17 ENCOUNTER — Encounter: Payer: Self-pay | Admitting: Obstetrics & Gynecology

## 2018-07-20 ENCOUNTER — Ambulatory Visit
Admission: RE | Admit: 2018-07-20 | Discharge: 2018-07-20 | Disposition: A | Discharge status: Home / Self Care | Source: Ambulatory Visit | Attending: Internal Medicine | Admitting: Internal Medicine

## 2018-07-20 DIAGNOSIS — R05 Cough: Secondary | ICD-10-CM

## 2018-07-20 DIAGNOSIS — R053 Chronic cough: Secondary | ICD-10-CM

## 2018-07-20 DIAGNOSIS — D3A090 Benign carcinoid tumor of the bronchus and lung: Secondary | ICD-10-CM

## 2018-07-21 ENCOUNTER — Encounter: Payer: Self-pay | Admitting: *Deleted

## 2018-07-30 ENCOUNTER — Other Ambulatory Visit: Payer: Self-pay | Admitting: *Deleted

## 2018-07-30 ENCOUNTER — Encounter: Payer: Self-pay | Admitting: Obstetrics & Gynecology

## 2018-08-03 ENCOUNTER — Ambulatory Visit (INDEPENDENT_AMBULATORY_CARE_PROVIDER_SITE_OTHER): Admitting: Obstetrics & Gynecology

## 2018-08-03 ENCOUNTER — Other Ambulatory Visit: Payer: Self-pay

## 2018-08-03 ENCOUNTER — Encounter: Payer: Self-pay | Admitting: Obstetrics & Gynecology

## 2018-08-03 VITALS — BP 146/86 | HR 68 | Resp 14 | Ht 59.25 in | Wt 236.0 lb

## 2018-08-03 DIAGNOSIS — M8580 Other specified disorders of bone density and structure, unspecified site: Secondary | ICD-10-CM

## 2018-08-03 DIAGNOSIS — N390 Urinary tract infection, site not specified: Secondary | ICD-10-CM

## 2018-08-03 MED ORDER — ESTRADIOL 0.1 MG/GM VA CREA
TOPICAL_CREAM | VAGINAL | 3 refills | Status: DC
Start: 2018-08-03 — End: 2019-04-13

## 2018-08-03 NOTE — Progress Notes (Signed)
Subjective:    73 yrs Married Caucasian G3P3  female here to discuss recent BMD obtained 07/26/17 showing osteopenia with t score -2.4 in left hip.  Does not have any hx of injury to this hip but pt states left leg is just a little shorter than her right leg.  Wonders if this is contributing.       She is also here to discuss recurrent UTI issues.  She's had 3 UTI's in the last six months.  Recommendations to decrease UTI frequency discussed.  She does have antibiotics on hand if needed.  Feel vaginal estrogen should be initiated.  Pt does not feel this is related to intercourse.  Osteoporosis Risk Factors  Nonmodifiable Personal Hx of fracture as an adult: no Caucasian race: yes Advanced age: no Female sex: yes Dementia: no Poor health/frailty: no  Potentially modifiable: Tobacco use: no Low body weight (<127 lbs): no Estrogen deficiency  early menopause (age <45) or bilateral ovariectomy: no  prolonged premenopausal amenorrhea (>1 yr): no Low calcium intake (lifelong): yes Alcohol use more than 2 drinks per day: no Recurrent falls: no  Current calcium and Vit D intake: MVI and 500mg  calcium with Vit D in it  Review of Systems A comprehensive review of systems was negative.     Objective:   PHYSICAL EXAM BP (!) 146/86 (BP Location: Right Arm, Patient Position: Sitting, Cuff Size: Large)   Pulse 68   Resp 14   Ht 4' 11.25" (1.505 m)   Wt 236 lb (107 kg)   LMP 02/12/2011   BMI 47.26 kg/m  General appearance: alert, cooperative and no distress  Imaging Bone Density: Spine T Score: -0.3, Hip T Score: -2.4 (left) and -1.5 (right)                              Assessment:   Osteopenia with T score -2.4 in her left hip Recurrent UTI   Plan:   1.  I do not think she should start any treatment at this time but should plan to repeat the BMD in 2 years. 2.  She will continue her calcium supplementation.  She did go for extended period of time without adequate calcium  intake. 3.  Will check Vit D and PTH with intact calcium today.   4.  Will start vaginal estrace 1 gram pv twice weekly.  Rx to pharmacy.  Has rx on hand if has UTI symptoms.  Consider urology referral if estrogen does not help.  ~25 minutes spent with patient >50% of time was in face to face discussion of above.

## 2018-08-04 LAB — PTH, INTACT AND CALCIUM
CALCIUM: 9.4 mg/dL (ref 8.7–10.3)
PTH: 30 pg/mL (ref 15–65)

## 2018-08-04 LAB — VITAMIN D 25 HYDROXY (VIT D DEFICIENCY, FRACTURES): Vit D, 25-Hydroxy: 32.5 ng/mL (ref 30.0–100.0)

## 2018-08-12 ENCOUNTER — Telehealth: Payer: Self-pay | Admitting: Obstetrics & Gynecology

## 2018-08-12 ENCOUNTER — Encounter: Payer: Self-pay | Admitting: Obstetrics & Gynecology

## 2018-08-12 NOTE — Telephone Encounter (Signed)
Patient sent the following correspondence through [place]. Routing to triage to assist patient with request.  Sorry, but its me again. I had symptoms of uti while out of town last week. Im so glad I had medicine. I have 2 more trips coming up and will be home for good on October 5. Please give me another refill to have on hand or maybe a preventive dose? I really appreciate it. We leave Sunday. When my traveling is over, if the problem continues, I will come in so we can talk. Thank you!

## 2018-08-13 ENCOUNTER — Other Ambulatory Visit: Payer: Self-pay | Admitting: Obstetrics & Gynecology

## 2018-08-13 MED ORDER — SULFAMETHOXAZOLE-TRIMETHOPRIM 800-160 MG PO TABS: 1.0000 | ORAL_TABLET | Freq: Two times a day (BID) | ORAL | 0 refills | Status: DC

## 2018-08-13 MED ORDER — NITROFURANTOIN MACROCRYSTAL 50 MG PO CAPS: 50.0000 mg | ORAL_CAPSULE | Freq: Every day | ORAL | 1 refills | Status: DC

## 2018-08-13 NOTE — Progress Notes (Signed)
rx for macrobid 50mg  daily and for bactrim DS bid x 3 days if has symptoms.

## 2018-08-13 NOTE — Telephone Encounter (Signed)
Spoke with patient, advised as seen below per Dr. Miller. Patient verbalizes understanding and is agreeable.   Encounter closed.  

## 2018-08-13 NOTE — Telephone Encounter (Signed)
Rx for macrobid 50mg  daily sent to pharmacy.  I want her to start taking this daily for prophylaxis and see if that stops the recurrent cystitis issues.  I will want her to take this for 6 months.  I also sent a prescription for bactrim DS bid x 3 days.  #6/0RF.  She should use this if she has another UTI.    She recently starting vaginal estrogen cream but it hasn't been enough time for this to make a difference.  The antibiotics will help in the short term.  I chose something that is safe to take in this manner (daily) and will not cause issues with resistance for other antibiotics.  Thanks.

## 2018-09-03 ENCOUNTER — Ambulatory Visit: Admitting: Cardiology

## 2018-09-06 ENCOUNTER — Ambulatory Visit: Admitting: Cardiology

## 2018-09-20 ENCOUNTER — Other Ambulatory Visit: Payer: Self-pay | Admitting: Cardiology

## 2018-09-25 ENCOUNTER — Other Ambulatory Visit: Payer: Self-pay | Admitting: Internal Medicine

## 2018-09-28 ENCOUNTER — Ambulatory Visit (INDEPENDENT_AMBULATORY_CARE_PROVIDER_SITE_OTHER)

## 2018-09-28 ENCOUNTER — Ambulatory Visit (INDEPENDENT_AMBULATORY_CARE_PROVIDER_SITE_OTHER): Admitting: Family Medicine

## 2018-09-28 VITALS — BP 144/88 | HR 67 | Temp 98.3°F

## 2018-09-28 DIAGNOSIS — M25561 Pain in right knee: Secondary | ICD-10-CM

## 2018-09-28 NOTE — Progress Notes (Signed)
Nichole Smith - 62 y.o. female MRN [id number]  Date of birth: 06/29/1956  SUBJECTIVE:  Including CC & ROS.  Chief Complaint  Patient presents with  . Leg Pain    felt a "snap" yesterday, a few days ago pain in back of R leg/knee started.    EDITA Smith is a 62 y.o. female that is presenting with right posterior knee pain.  The pain is occurring on the right posterior knee and localized to the area.  Is intermittent in nature.  Is worse with rising from a seated position.  Denies any mechanical symptoms.  Denies any effusion.  The pain is severe when it occurs.  She has not had any giving way.  Has had improvement with Aleve.    Review of Systems  Constitutional: Negative for fever.  HENT: Negative for congestion.   Respiratory: Negative for cough.   Cardiovascular: Negative for chest pain.  Gastrointestinal: Negative for abdominal pain.  Musculoskeletal: Positive for gait problem.  Skin: Negative for color change.  Neurological: Negative for weakness.  Hematological: Negative for adenopathy.  Psychiatric/Behavioral: Negative for agitation.    HISTORY: Past Medical, Surgical, Social, and Family History Reviewed & Updated per EMR.   Pertinent Historical Findings include:  Past Medical History:  Diagnosis Date  . Arthritis    knees, back  . Cancer ([place])   . Carcinoid tumor of lung 2012   right found incidentally on chest xray eval for r/o vasxculitis   . Carpal tunnel syndrome, bilateral   . Coronary artery calcification 07/23/2016  . Double vision    history, no current problem  . Dysrhythmia    irregular occassional, no current problem  . HNP (herniated nucleus pulposus), cervical 01/22/2011   C6-C7  Has seen dr Vertell Limber for this and Dr Tonita Cong  Positional numbness in hands no weakness    . Hypertension   . Hypothyroidism   . 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 Obstructive sleep apnea 07/27/2014   NPSG 07/2014:  AHI 85/hr, optimal cpap 10cm.  Download 08/2014:   Good compliance, breakthru apnea on 10cm.  Changed to auto 5-12cm >> good control of AHI on f/u download ONO on CPAP:      . SUI (stress urinary incontinence, female)    h/o    Past Surgical History:  Procedure Laterality Date  . ANTERIOR CERVICAL DECOMP/DISCECTOMY FUSION  12/24/2017   Procedure: Cervical five-six Cervical six-seven Anterior cervical decompression/discectomy/fusion;  Surgeon: Erline Levine, MD;  Location: [facility];  Service: Neurosurgery;;  . Wilmon Pali RELEASE Left 12/24/2017   Procedure: LEFT CARPAL TUNNEL RELEASE;  Surgeon: Erline Levine, MD;  Location: [facility];  Service: Neurosurgery;  Laterality: Left;  . CESAREAN SECTION     x 1 - twins  . COLONOSCOPY    . DILATATION & CURRETTAGE/HYSTEROSCOPY WITH RESECTOCOPE N/A 07/18/2013   Procedure: DILATATION & CURETTAGE/HYSTEROSCOPY WITH RESECTOCOPE;  Surgeon: Lyman Speller, MD;  Location: [facility];  Service: Gynecology;  Laterality: N/A;  mass resection  . Lung tumor removed     Rt lung carcinoid  . MOUTH SURGERY     pre-cancerous ulcer removed  . SHOULDER OPEN ROTATOR CUFF REPAIR  07/20/2010   Right  . WISDOM TOOTH EXTRACTION      Allergies  Allergen Reactions  . Neomycin Other (See Comments)    Inflammation--eye drop   . Ace Inhibitors Cough    Family History  Problem Relation Age of Onset  . Stroke Mother  died age 101  . Hypertension Mother   . Coronary artery disease Father        died age 63  . Hypertension Father   . Parkinson's disease Brother   . Diabetes Brother   . Hypertension Brother   . Hypertension Maternal Uncle   . Colon cancer Neg Hx   . Rectal cancer Neg Hx   . Stomach cancer Neg Hx      Social History   Socioeconomic History  . Marital status: Married    Spouse name: Not on file  . Number of children: 3  . Years of education: Not on file  . Highest education level: Not on file  Occupational History  . Occupation: retied    Fish farm manager: [employer]    Social Needs  . Financial resource strain: Not on file  . Food insecurity:    Worry: Not on file    Inability: Not on file  . Transportation needs:    Medical: Not on file    Non-medical: Not on file  Tobacco Use  . Smoking status: Never Smoker  . Smokeless tobacco: Never Used  Substance and Sexual Activity  . Alcohol use: Yes    Alcohol/week: 2.0 standard drinks    Types: 2 Glasses of wine per week    Comment: occasional  . Drug use: No  . Sexual activity: Yes    Partners: Male    Birth control/protection: None, Post-menopausal  Lifestyle  . Physical activity:    Days per week: Not on file    Minutes per session: Not on file  . Stress: Not on file  Relationships  . Social connections:    Talks on phone: Not on file    Gets together: Not on file    Attends religious service: Not on file    Active member of club or organization: Not on file    Attends meetings of clubs or organizations: Not on file    Relationship status: Not on file  . Intimate partner violence:    Fear of current or ex partner: Not on file    Emotionally abused: Not on file    Physically abused: Not on file    Forced sexual activity: Not on file  Other Topics Concern  . Not on file  Social History Narrative   hhof 2    2 Children at college and  beyond      Nichole Smith    Married    Retired age 49 2015     PHYSICAL EXAM:  VS: BP (!) 144/88 (BP Location: Left Arm, Patient Position: Sitting, Cuff Size: Large)   Pulse 67   Temp 98.3 F (36.8 C) (Oral)   LMP 02/12/2011   SpO2 95%  Physical Exam Gen: NAD, alert, cooperative with exam, well-appearing ENT: normal lips, normal nasal mucosa,  Eye: normal EOM, normal conjunctiva and lids CV:  no edema, +2 pedal pulses   Resp: no accessory muscle use, non-labored,  Skin: no rashes, no areas of induration  Neuro: normal tone, normal sensation to touch Psych:  normal insight, alert and oriented MSK:  Right knee: No effusion. Normal range  of motion. Normal strength resistance. No instability. No pain with patellar grind. Mild pain with resistance to knee flexion. Slight limp with walking. Neurovascularly intact  Limited ultrasound: Right knee:  No effusion in the suprapatellar pouch. Normal-appearing biceps femoris Normal-appearing lateral gastroc. Normal-appearing lateral joint line  Summary: Normal exam  Ultrasound and interpretation by Ysidro Evert  Raeford Razor, MD      ASSESSMENT & PLAN:   Acute pain of right knee Structurally her knee looks good.  It seems to be more of a hamstring issue as opposed to her knee.  Lateral gastroc does not have any structural changes either.  Could have had a snapping sensation between the biceps femoris and the lateral gastroc. -Counseled on supportive care and home exercise therapy. -Can take Aleve for pain. -If no improvement then would consider imaging and referral to physical therapy.   The above documentation has been reviewed and is accurate and complete. Clearance Coots, MD 09/28/2018, 5:21 PM>

## 2018-09-28 NOTE — Patient Instructions (Signed)
Good to see you  Please try ice  Please try the exercises  You can take aleve for the pain  Please see me back in 2-3 weeks if your pain hasn't improved.

## 2018-09-28 NOTE — Assessment & Plan Note (Signed)
Structurally her knee looks good.  It seems to be more of a hamstring issue as opposed to her knee.  Lateral gastroc does not have any structural changes either.  Could have had a snapping sensation between the biceps femoris and the lateral gastroc. -Counseled on supportive care and home exercise therapy. -Can take Aleve for pain. -If no improvement then would consider imaging and referral to physical therapy.

## 2018-11-01 ENCOUNTER — Encounter: Payer: Self-pay | Admitting: Cardiology

## 2018-11-01 ENCOUNTER — Ambulatory Visit (INDEPENDENT_AMBULATORY_CARE_PROVIDER_SITE_OTHER): Admitting: Cardiology

## 2018-11-01 VITALS — BP 138/86 | HR 64 | Ht 59.25 in | Wt 237.0 lb

## 2018-11-01 DIAGNOSIS — I10 Essential (primary) hypertension: Secondary | ICD-10-CM | POA: Diagnosis not present

## 2018-11-01 DIAGNOSIS — R079 Chest pain, unspecified: Secondary | ICD-10-CM

## 2018-11-01 DIAGNOSIS — I2584 Coronary atherosclerosis due to calcified coronary lesion: Secondary | ICD-10-CM

## 2018-11-01 DIAGNOSIS — E785 Hyperlipidemia, unspecified: Secondary | ICD-10-CM | POA: Diagnosis not present

## 2018-11-01 DIAGNOSIS — I2510 Atherosclerotic heart disease of native coronary artery without angina pectoris: Secondary | ICD-10-CM

## 2018-11-01 NOTE — Progress Notes (Signed)
Cardiology Office Note:   Date:  11/01/2018   ID:  [name], [name] July 05, 1956, MRN [id number]  PCP:  Burnis Medin, MD  Cardiologist:  Fransico Him, MD    Referring MD: Burnis Medin, MD   Chief Complaint  Patient presents with  . Hyperlipidemia  . Hypertension    History of Present Illness:    Nichole Smith is a 62 y.o. 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 Cyndia Bent, HTN, OSA on CPAP followed by Dr. Halford Chessman and coronary artery calcifications on chest CT as well as calcifications of the aortic arch and carotid bifurcation.  She had a nuclear stress test 08/2016 for risk stratification that was low risk with no ischemia.    Past Medical History:  Diagnosis Date  . Arthritis    knees, back  . Cancer ([place])   . Carcinoid tumor of lung 2012   right found incidentally on chest xray eval for r/o vasxculitis   . Carpal tunnel syndrome, bilateral   . Coronary artery calcification 07/23/2016  . Double vision    history, no current problem  . Dysrhythmia    irregular occassional, no current problem  . HNP (herniated nucleus pulposus), cervical 01/22/2011   C6-C7  Has seen dr Vertell Limber for this and Dr Tonita Cong  Positional numbness in hands no weakness    . Hypertension   . Hypothyroidism   . 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 Obstructive sleep apnea 07/27/2014   NPSG 07/2014:  AHI 85/hr, optimal cpap 10cm.  Download 08/2014:  Good compliance, breakthru apnea on 10cm.  Changed to auto 5-12cm >> good control of AHI on f/u download ONO on CPAP:      . SUI (stress urinary incontinence, female)    h/o    Past Surgical History:  Procedure Laterality Date  . ANTERIOR CERVICAL DECOMP/DISCECTOMY FUSION  12/24/2017   Procedure: Cervical five-six Cervical six-seven Anterior cervical decompression/discectomy/fusion;  Surgeon: Erline Levine, MD;  Location: [facility];  Service: Neurosurgery;;  . Wilmon Pali RELEASE Left 12/24/2017   Procedure: LEFT CARPAL TUNNEL RELEASE;  Surgeon: Erline Levine, MD;  Location: [facility];  Service: Neurosurgery;  Laterality: Left;  . CESAREAN SECTION     x 1 - twins  . COLONOSCOPY    . DILATATION & CURRETTAGE/HYSTEROSCOPY WITH RESECTOCOPE N/A 07/18/2013   Procedure: DILATATION & CURETTAGE/HYSTEROSCOPY WITH RESECTOCOPE;  Surgeon: Lyman Speller, MD;  Location: [facility];  Service: Gynecology;  Laterality: N/A;  mass resection  . Lung tumor removed     Rt lung carcinoid  . MOUTH SURGERY     pre-cancerous ulcer removed  . SHOULDER OPEN ROTATOR CUFF REPAIR  07/20/2010   Right  . WISDOM TOOTH EXTRACTION      Current Medications: Current Meds  Medication Sig  . aspirin EC 81 MG tablet Take 81 mg by mouth at bedtime.  [name] atorvastatin (LIPITOR) 40 MG tablet TAKE 1 TABLET DAILY  . CALCIUM PO Take 1 tablet by mouth at bedtime.   [name] diltiazem (CARDIZEM CD) 180 MG 24 hr capsule Take 1 capsule (180 mg total) by mouth daily. (Patient taking differently: Take 180 mg by mouth at bedtime. )  . estradiol (ESTRACE) 0.1 MG/GM vaginal cream 1 gram pv twice weekly  . hydrochlorothiazide (MICROZIDE) 12.5 MG capsule TAKE 1 CAPSULE DAILY.   SCHEDULE ANNUAL VISIT  . loratadine (CLARITIN) 10 MG tablet Take 10 mg by mouth daily.  . metoprolol succinate (TOPROL-XL)  50 MG 24 hr tablet Take 1 tablet (50 mg total) by mouth daily.  . Multiple Vitamins-Minerals (PRESERVISION AREDS 2 PO) Take 1 tablet by mouth 2 (two) times daily.  . naproxen sodium (ALEVE) 220 MG tablet Take 220 mg by mouth 2 (two) times daily as needed (for pain.).  [name] nitrofurantoin (MACRODANTIN) 50 MG capsule Take 1 capsule (50 mg total) by mouth daily.  . promethazine-dextromethorphan (PROMETHAZINE-DM) 6.25-15 MG/5ML syrup Take 5 mLs by mouth 4 (four) times daily as needed for cough.  . SYNTHROID 137 MCG tablet TAKE 1 TABLET DAILY     Allergies:   Neomycin and Ace inhibitors   Social History   Socioeconomic History  . Marital status: Married      Spouse name: Not on file  . Number of children: 3  . Years of education: Not on file  . Highest education level: Not on file  Occupational History  . Occupation: retied    Fish farm manager: [employer]  Social Needs  . Financial resource strain: Not on file  . Food insecurity:    Worry: Not on file    Inability: Not on file  . Transportation needs:    Medical: Not on file    Non-medical: Not on file  Tobacco Use  . Smoking status: Never Smoker  . Smokeless tobacco: Never Used  Substance and Sexual Activity  . Alcohol use: Yes    Alcohol/week: 2.0 standard drinks    Types: 2 Glasses of wine per week    Comment: occasional  . Drug use: No  . Sexual activity: Yes    Partners: Male    Birth control/protection: None, Post-menopausal  Lifestyle  . Physical activity:    Days per week: Not on file    Minutes per session: Not on file  . Stress: Not on file  Relationships  . Social connections:    Talks on phone: Not on file    Gets together: Not on file    Attends religious service: Not on file    Active member of club or organization: Not on file    Attends meetings of clubs or organizations: Not on file    Relationship status: Not on file  Other Topics Concern  . Not on file  Social History Narrative   hhof 2    2 Children at college and  beyond      Nichole Smith    Married    Retired age 43 2015     Family History: The patient's family history includes Coronary artery disease in her father; Diabetes in her brother; Hypertension in her brother, father, maternal uncle, and mother; Parkinson's disease in her brother; Stroke in her mother. There is no history of Colon cancer, Rectal cancer, or Stomach cancer.  ROS:   Please see the history of present illness.    ROS  All other systems reviewed and negative.   EKGs/Labs/Other Studies Reviewed:    The following studies were reviewed today: None  EKG:  EKG is  ordered today.  The ekg ordered today  demonstrates normal sinus rhythm with no ST changes and normal intervals at 64 bpm  Recent Labs: 06/07/2018: ALT 23; BUN 20; Creatinine, Ser 0.73; Hemoglobin 15.4; Platelets 228.0; Potassium 4.0; Sodium 141; TSH 3.66   Recent Lipid Panel    Component Value Date/Time   CHOL 115 06/07/2018 0739   CHOL 110 11/03/2016 0000   TRIG 54.0 06/07/2018 0739   TRIG 54 11/03/2016 0000   HDL 50.10  06/07/2018 0739   HDL 48 (L) 11/03/2016 0000   CHOLHDL 2 06/07/2018 0739   VLDL 10.8 06/07/2018 0739   LDLCALC 54 06/07/2018 0739   LDLCALC 49 11/03/2016 0000    Physical Exam:    VS:  BP 138/86   Pulse 64   Ht 4' 11.25" (1.505 m)   Wt 237 lb (107.5 kg)   LMP 02/12/2011   BMI 47.47 kg/m     Wt Readings from Last 3 Encounters:  11/01/18 237 lb (107.5 kg)  08/03/18 236 lb (107 kg)  06/21/18 235 lb 8 oz (106.8 kg)     GEN:  Well nourished, well developed in no acute distress HEENT: Normal NECK: No JVD; No carotid bruits LYMPHATICS: No lymphadenopathy CARDIAC: RRR, no murmurs, rubs, gallops RESPIRATORY:  Clear to auscultation without rales, wheezing or rhonchi  ABDOMEN: Soft, non-tender, non-distended MUSCULOSKELETAL:  No edema; No deformity  SKIN: Warm and dry NEUROLOGIC:  Alert and oriented x 3 PSYCHIATRIC:  Normal affect   ASSESSMENT:    1. Coronary artery calcification   2. Essential hypertension   3. Hyperlipidemia LDL goal <70   4. Severe obesity (BMI >= 40) (HCC)    PLAN:    In order of problems listed above:  1.  Coronary artery calcifications on chest CT -she occasionally has a pain in her left breast a few times a year with no associated symptoms or radiation of the pain.  She says in the back of her mind she worries that it could be her heart.  She has not had a stress test in 2 years.  I will get a Lexiscan Myoview to rule out ischemia.  She will continue on statin and aspirin 81 mg daily.    2.  HTN -BP is adequately controlled on exam today.  She will continue on HCTZ  12.5 mg daily, Toprol-XL 50 mg daily and Cardizem CD 180 mg daily.  Her creatinine was stable at 0.73 on 06/07/2018 and potassium was 4.  3.  Hyperlipidemia -her LDL goal is less than 70.  Her LDL was 54 on 06/07/2018 with a normal ALT.  She will continue on atorvastatin 40 mg daily.  4.  Severe Obesity - I have encouraged her to get into a routine exercise program and cut back on carbs and portions.    Medication Adjustments/Labs and Tests Ordered: Current medicines are reviewed at length with the patient today.  Concerns regarding medicines are outlined above.  No orders of the defined types were placed in this encounter.  No orders of the defined types were placed in this encounter.   Signed, Fransico Him, MD  11/01/2018 9:56 AM    Nichole Smith

## 2018-11-01 NOTE — Patient Instructions (Signed)
Medication Instructions:  Your physician recommends that you continue on your current medications as directed. Please refer to the Current Medication list given to you today.  If you need a refill on your cardiac medications before your next appointment, please call your pharmacy.   Lab work:  If you have labs (blood work) drawn today and your tests are completely normal, you will receive your results only by: [name] MyChart Message (if you have MyChart) OR . A paper copy in the mail If you have any lab test that is abnormal or we need to change your treatment, we will call you to review the results.  Testing/Procedures: Your physician has requested that you have a lexiscan myoview. For further information please visit [email or web address]. Please follow instruction sheet, as given.  Follow-Up: At [facility], you and your health needs are our priority.  As part of our continuing mission to provide you with exceptional heart care, we have created designated Provider Care Teams.  These Care Teams include your primary Cardiologist (physician) and Advanced Practice Providers (APPs -  Physician Assistants and Nurse Practitioners) who all work together to provide you with the care you need, when you need it. You will need a follow up appointment in 1 years.  Please call our office 2 months in advance to schedule this appointment.  You may see Fransico Him, MD or one of the following Advanced Practice Providers on your designated Care Team:   Russell, PA-C Melina Copa, PA-C . Ermalinda Barrios, PA-C

## 2018-11-11 ENCOUNTER — Other Ambulatory Visit: Payer: Self-pay | Admitting: Obstetrics & Gynecology

## 2018-11-22 ENCOUNTER — Telehealth (HOSPITAL_COMMUNITY): Payer: Self-pay | Admitting: *Deleted

## 2018-11-22 ENCOUNTER — Other Ambulatory Visit: Payer: Self-pay | Admitting: Cardiology

## 2018-11-22 NOTE — Telephone Encounter (Signed)
Patient given detailed instructions per Myocardial Perfusion Study Information Sheet for the test on 11/29/18 at 1300. Patient notified to arrive 15 minutes early and that it is imperative to arrive on time for appointment to keep from having the test rescheduled.  If you need to cancel or reschedule your appointment, please call the office within 24 hours of your appointment. . Patient verbalized understanding.Nichole Smith, Ranae Palms

## 2018-11-29 ENCOUNTER — Ambulatory Visit (HOSPITAL_COMMUNITY): Attending: Cardiology

## 2018-11-29 DIAGNOSIS — R079 Chest pain, unspecified: Secondary | ICD-10-CM | POA: Diagnosis not present

## 2018-11-29 MED ORDER — TECHNETIUM TC 99M TETROFOSMIN IV KIT
29.3000 | PACK | Freq: Once | INTRAVENOUS | Status: AC | PRN
  Administered 2018-11-29: 29.3 via INTRAVENOUS
  Filled 2018-11-29: qty 30

## 2018-11-29 MED ORDER — REGADENOSON 0.4 MG/5ML IV SOLN
0.4000 mg | Freq: Once | INTRAVENOUS | Status: AC
  Administered 2018-11-29: 0.4 mg via INTRAVENOUS

## 2018-11-30 ENCOUNTER — Ambulatory Visit (HOSPITAL_COMMUNITY): Attending: Cardiology

## 2018-11-30 LAB — MYOCARDIAL PERFUSION IMAGING
CHL CUP RESTING HR STRESS: 65 {beats}/min
CSEPPHR: 86 {beats}/min
LV sys vol: 61 mL
LVDIAVOL: 106 mL (ref 46–106)
SDS: 7
SRS: 0
SSS: 7
TID: 0.95

## 2018-11-30 MED ORDER — TECHNETIUM TC 99M TETROFOSMIN IV KIT
31.5000 | PACK | Freq: Once | INTRAVENOUS | Status: AC | PRN
  Administered 2018-11-30: 31.5 via INTRAVENOUS
  Filled 2018-11-30: qty 32

## 2018-12-02 ENCOUNTER — Telehealth: Payer: Self-pay

## 2018-12-02 DIAGNOSIS — R9439 Abnormal result of other cardiovascular function study: Secondary | ICD-10-CM

## 2018-12-02 NOTE — Telephone Encounter (Signed)
Spoke with the patient she expressed understanding about her stress test results and agreed to an echo to reassess EF. Message sent to [place].

## 2018-12-02 NOTE — Telephone Encounter (Signed)
-----   Message from Sueanne Margarita, MD sent at 11/30/2018  6:16 PM EST ----- These let patient know that this test showed normal blood flow to her heart.  The EF was read as mildly reduced but this can be falsely low on nuclear stress testing.  Please get a 2D echocardiogram to assess LV function further

## 2018-12-13 ENCOUNTER — Ambulatory Visit (HOSPITAL_COMMUNITY): Attending: Cardiovascular Disease

## 2018-12-13 ENCOUNTER — Other Ambulatory Visit: Payer: Self-pay

## 2018-12-13 DIAGNOSIS — R9439 Abnormal result of other cardiovascular function study: Secondary | ICD-10-CM | POA: Insufficient documentation

## 2018-12-19 ENCOUNTER — Other Ambulatory Visit: Payer: Self-pay | Admitting: Cardiology

## 2018-12-20 ENCOUNTER — Other Ambulatory Visit: Payer: Self-pay | Admitting: Cardiology

## 2018-12-26 ENCOUNTER — Other Ambulatory Visit: Payer: Self-pay | Admitting: Internal Medicine

## 2018-12-30 ENCOUNTER — Encounter (INDEPENDENT_AMBULATORY_CARE_PROVIDER_SITE_OTHER)

## 2019-01-10 ENCOUNTER — Ambulatory Visit (INDEPENDENT_AMBULATORY_CARE_PROVIDER_SITE_OTHER): Admitting: Family Medicine

## 2019-01-11 ENCOUNTER — Ambulatory Visit (INDEPENDENT_AMBULATORY_CARE_PROVIDER_SITE_OTHER): Admitting: Family Medicine

## 2019-01-12 ENCOUNTER — Ambulatory Visit (INDEPENDENT_AMBULATORY_CARE_PROVIDER_SITE_OTHER): Admitting: Family Medicine

## 2019-01-12 ENCOUNTER — Encounter (INDEPENDENT_AMBULATORY_CARE_PROVIDER_SITE_OTHER): Payer: Self-pay | Admitting: Family Medicine

## 2019-01-12 VITALS — BP 163/79 | HR 65 | Temp 97.6°F | Ht 60.0 in | Wt 238.0 lb

## 2019-01-12 DIAGNOSIS — Z1331 Encounter for screening for depression: Secondary | ICD-10-CM

## 2019-01-12 DIAGNOSIS — Z9189 Other specified personal risk factors, not elsewhere classified: Secondary | ICD-10-CM | POA: Diagnosis not present

## 2019-01-12 DIAGNOSIS — E66813 Obesity, class 3: Secondary | ICD-10-CM

## 2019-01-12 DIAGNOSIS — R0602 Shortness of breath: Secondary | ICD-10-CM | POA: Diagnosis not present

## 2019-01-12 DIAGNOSIS — I10 Essential (primary) hypertension: Secondary | ICD-10-CM

## 2019-01-12 DIAGNOSIS — R5383 Other fatigue: Secondary | ICD-10-CM

## 2019-01-12 DIAGNOSIS — R7303 Prediabetes: Secondary | ICD-10-CM

## 2019-01-12 DIAGNOSIS — Z0289 Encounter for other administrative examinations: Secondary | ICD-10-CM

## 2019-01-12 DIAGNOSIS — Z6841 Body Mass Index (BMI) 40.0 and over, adult: Secondary | ICD-10-CM

## 2019-01-13 LAB — COMPREHENSIVE METABOLIC PANEL
ALT: 26 IU/L (ref 0–32)
AST: 24 IU/L (ref 0–40)
Albumin/Globulin Ratio: 1.4 (ref 1.2–2.2)
Albumin: 4.2 g/dL (ref 3.8–4.8)
Alkaline Phosphatase: 91 IU/L (ref 39–117)
BUN/Creatinine Ratio: 34 — ABNORMAL HIGH (ref 12–28)
BUN: 22 mg/dL (ref 8–27)
Bilirubin Total: 0.6 mg/dL (ref 0.0–1.2)
CALCIUM: 9.5 mg/dL (ref 8.7–10.3)
CO2: 27 mmol/L (ref 20–29)
Chloride: 98 mmol/L (ref 96–106)
Creatinine, Ser: 0.64 mg/dL (ref 0.57–1.00)
GFR calc Af Amer: 109 mL/min/{1.73_m2} (ref >59)
GFR calc non Af Amer: 95 mL/min/{1.73_m2} (ref >59)
Globulin, Total: 3.1 g/dL (ref 1.5–4.5)
Glucose: 108 mg/dL — ABNORMAL HIGH (ref 65–99)
Potassium: 3.9 mmol/L (ref 3.5–5.2)
Sodium: 140 mmol/L (ref 134–144)
Total Protein: 7.3 g/dL (ref 6.0–8.5)

## 2019-01-13 LAB — INSULIN, RANDOM: INSULIN: 20.4 u[IU]/mL (ref 2.6–24.9)

## 2019-01-13 LAB — HEMOGLOBIN A1C
Est. average glucose Bld gHb Est-mCnc: 123 mg/dL
Hgb A1c MFr Bld: 5.9 % — ABNORMAL HIGH (ref 4.8–5.6)

## 2019-01-13 LAB — CBC WITH DIFFERENTIAL
BASOS ABS: 0.1 10*3/uL (ref 0.0–0.2)
Basos: 1 %
EOS (ABSOLUTE): 0.3 10*3/uL (ref 0.0–0.4)
Eos: 3 %
Hematocrit: 47.8 % — ABNORMAL HIGH (ref 34.0–46.6)
Hemoglobin: 16.3 g/dL — ABNORMAL HIGH (ref 11.1–15.9)
Immature Grans (Abs): 0 10*3/uL (ref 0.0–0.1)
Immature Granulocytes: 0 %
Lymphocytes Absolute: 2.3 10*3/uL (ref 0.7–3.1)
Lymphs: 30 %
MCH: 31.1 pg (ref 26.6–33.0)
MCHC: 34.1 g/dL (ref 31.5–35.7)
MCV: 91 fL (ref 79–97)
Monocytes Absolute: 0.6 10*3/uL (ref 0.1–0.9)
Monocytes: 8 %
NEUTROS PCT: 58 %
Neutrophils Absolute: 4.5 10*3/uL (ref 1.4–7.0)
RBC: 5.24 x10E6/uL (ref 3.77–5.28)
RDW: 12 % (ref 11.7–15.4)
WBC: 7.8 10*3/uL (ref 3.4–10.8)

## 2019-01-13 LAB — VITAMIN B12: Vitamin B-12: 452 pg/mL (ref 232–1245)

## 2019-01-13 LAB — T3: T3, Total: 141 ng/dL (ref 71–180)

## 2019-01-13 LAB — LIPID PANEL WITH LDL/HDL RATIO
Cholesterol, Total: 133 mg/dL (ref 100–199)
HDL: 57 mg/dL (ref >39)
LDL Calculated: 64 mg/dL (ref 0–99)
LDl/HDL Ratio: 1.1 ratio (ref 0.0–3.2)
Triglycerides: 62 mg/dL (ref 0–149)
VLDL Cholesterol Cal: 12 mg/dL (ref 5–40)

## 2019-01-13 LAB — T4, FREE: FREE T4: 1.39 ng/dL (ref 0.82–1.77)

## 2019-01-13 LAB — VITAMIN D 25 HYDROXY (VIT D DEFICIENCY, FRACTURES): Vit D, 25-Hydroxy: 33.1 ng/mL (ref 30.0–100.0)

## 2019-01-13 LAB — FOLATE: FOLATE: 17 ng/mL (ref >3.0)

## 2019-01-13 LAB — TSH: TSH: 2.49 u[IU]/mL (ref 0.450–4.500)

## 2019-01-13 NOTE — Progress Notes (Signed)
Office: [phone]  /  Fax: [phone]   Dear Dr. Regis Bill,   Thank you for referring Nichole Smith to our clinic. The following note includes my evaluation and treatment recommendations.  HPI:   Chief Complaint: OBESITY    ZARIN HAGMANN has been referred by Standley Brooking. Panosh, MD for consultation regarding her obesity and obesity related comorbidities.    Nichole Smith (MR# [id number]) is a 63 y.o. female who presents on 01/12/2019 for obesity evaluation and treatment. Current BMI is Body mass index is 46.48 kg/m.[name] has been struggling with her weight for many years and has been unsuccessful in either losing weight, maintaining weight loss, or reaching her healthy weight goal.     [name] attended our information session and states she is currently in the action stage of change and ready to dedicate time achieving and maintaining a healthier weight. Kaslyn is interested in becoming our patient and working on intensive lifestyle modifications including (but not limited to) diet, exercise and weight loss.    Tarin states her family eats meals together she struggles with family and or coworkers weight loss sabotage her desired weight loss is 63 lbs she has been heavy most of  her life she started gaining weight in her late 76's her heaviest weight ever was 238 lbs she is a picky eater and doesn't like to eat healthier foods  she has significant food cravings issues  she is frequently drinking liquids with calories she frequently makes poor food choices she has problems with excessive hunger  she frequently eats larger portions than normal  she struggles with emotional eating    Fatigue Viney feels her energy is lower than it should be. This has worsened with weight gain and has not worsened recently. Carmesha admits to daytime somnolence and  admits to waking up still tired. Patient has a history of obstructive sleep apnea with the use of CPAP. Patent has a history of symptoms  of daytime fatigue. Patient generally gets 6 hours of sleep per night, and states they generally have nightime awakenings. Snoring is not present. Apneic episodes are not present. Epworth Sleepiness Score is 7.  Dyspnea on exertion Janautica notes increasing shortness of breath with exercising and seems to be worsening over time with weight gain. She notes getting out of breath sooner with activity than she used to. This has not gotten worse recently. EKG-Normal sinus rhythm previously. Olayinka denies orthopnea.  Hypertension AKIAH BAUCH is a 63 y.o. female with hypertension. Kelseigh's blood pressure is elevated today. She denies chest pain, chest pressure, or headaches. She is working on weight loss to help control her blood pressure with the goal of decreasing her risk of heart attack and stroke. Jameka's blood pressure is not currently controlled.  Pre-Diabetes Cathalina has a diagnosis of pre-diabetes based on her elevated Hgb A1c at 5.9 in July 2019. She was informed this puts her at greater risk of developing diabetes. She is not taking metformin currently and notes carbohydrate cravings. She continues to work on diet and exercise to decrease risk of diabetes. She denies nausea or hypoglycemia.  At risk for diabetes Luria is at higher than average risk for developing diabetes due to her obesity and pre-diabetes. She currently denies polyuria or polydipsia.  Depression Screen Jannie's Food and Mood (modified PHQ-9) score was  Depression screen PHQ 2/9 01/12/2019  Decreased Interest 3  Down, Depressed, Hopeless 1  PHQ - 2 Score 4  Altered sleeping 2  Tired, decreased  energy 3  Change in appetite 1  Feeling bad or failure about yourself  3  Trouble concentrating 1  Moving slowly or fidgety/restless 0  Suicidal thoughts 0  PHQ-9 Score 14  Difficult doing work/chores Somewhat difficult  Some recent data might be hidden    ASSESSMENT AND PLAN:  Other fatigue - Plan: Vitamin B12, CBC With  Differential, Folate, T3, T4, free, TSH, VITAMIN D 25 Hydroxy (Vit-D Deficiency, Fractures)  Shortness of breath on exertion - Plan: CBC With Differential, Lipid Panel With LDL/HDL Ratio  Essential hypertension  Prediabetes - Plan: Comprehensive metabolic panel, Hemoglobin A1c, Insulin, random  Depression screening  At risk for diabetes mellitus  Class 3 severe obesity with serious comorbidity and body mass index (BMI) of 45.0 to 49.9 in adult, unspecified obesity type ([place])  PLAN:  Fatigue Corda was informed that her fatigue may be related to obesity, depression or many other causes. Labs will be ordered, and in the meanwhile Veyda has agreed to work on diet, exercise and weight loss to help with fatigue. Proper sleep hygiene was discussed including the need for 7-8 hours of quality sleep each night. A sleep study was not ordered based on symptoms and Epworth score.  Dyspnea on exertion Annleigh's shortness of breath appears to be obesity related and exercise induced. She has agreed to work on weight loss and gradually increase exercise to treat her exercise induced shortness of breath. If Marbeth follows our instructions and loses weight without improvement of her shortness of breath, we will plan to refer to pulmonology. We will monitor this condition regularly. Jerianne agrees to this plan.  Hypertension We discussed sodium restriction, working on healthy weight loss, and a regular exercise program as the means to achieve improved blood pressure control. Itzayanna agreed with this plan and agreed to follow up as directed. We will continue to monitor her blood pressure as well as her progress with the above lifestyle modifications. Marquise will continue her medications as prescribed and will watch for signs of hypotension as she continues her lifestyle modifications. We will check CMP today. Antwanette agrees to follow up with our clinic in 2 weeks.  Pre-Diabetes Jazmynn will continue to work on weight  loss, exercise, and decreasing simple carbohydrates in her diet to help decrease the risk of diabetes. We dicussed metformin including benefits and risks. She was informed that eating too many simple carbohydrates or too many calories at one sitting increases the likelihood of GI side effects. Nickol declined metformin for now and a prescription was not written today. We will check Hgb A1c and insulin today. Charnita agrees to follow up with our clinic in 2 weeks as directed to monitor her progress.  Diabetes risk counseling Omunique was given extended (15 minutes) diabetes prevention counseling today. She is 63 y.o. female and has risk factors for diabetes including obesity and pre-diabetes. We discussed intensive lifestyle modifications today with an emphasis on weight loss as well as increasing exercise and decreasing simple carbohydrates in her diet.  Depression Screen Nareh had a moderately positive depression screening. Depression is commonly associated with obesity and often results in emotional eating behaviors. We will monitor this closely and work on CBT to help improve the non-hunger eating patterns. Referral to Psychology may be required if no improvement is seen as she continues in our clinic.  Obesity Briya is currently in the action stage of change and her goal is to continue with weight loss efforts. I recommend Nelli begin the structured treatment plan  as follows:  She has agreed to follow the Category 3 plan Pearlean has been instructed to eventually work up to a goal of 150 minutes of combined cardio and strengthening exercise per week for weight loss and overall health benefits. We discussed the following Behavioral Modification Strategies today: increasing lean protein intake, increasing vegetables and work on meal planning and easy cooking plans, and planning for success   She was informed of the importance of frequent follow up visits to maximize her success with intensive lifestyle  modifications for her multiple health conditions. She was informed we would discuss her lab results at her next visit unless there is a critical issue that needs to be addressed sooner. Newell agreed to keep her next visit at the agreed upon time to discuss these results.  ALLERGIES: Allergies  Allergen Reactions  . Neomycin Other (See Comments)    Inflammation--eye drop   . Ace Inhibitors Cough    MEDICATIONS: Current Outpatient Medications on File Prior to Visit  Medication Sig Dispense Refill  . aspirin EC 81 MG tablet Take 81 mg by mouth at bedtime.    [name] atorvastatin (LIPITOR) 40 MG tablet TAKE 1 TABLET DAILY 90 tablet 3  . CALCIUM PO Take 1 tablet by mouth at bedtime.     [name] diltiazem (CARDIZEM CD) 180 MG 24 hr capsule Take 1 capsule (180 mg total) by mouth at bedtime. 90 capsule 3  . estradiol (ESTRACE) 0.1 MG/GM vaginal cream 1 gram pv twice weekly 42.5 g 3  . hydrochlorothiazide (MICROZIDE) 12.5 MG capsule TAKE 1 CAPSULE DAILY.   SCHEDULE ANNUAL VISIT 90 capsule 4  . Multiple Vitamins-Minerals (PRESERVISION AREDS 2 PO) Take 1 tablet by mouth 2 (two) times daily.    . naproxen sodium (ALEVE) 220 MG tablet Take 220 mg by mouth 2 (two) times daily as needed (for pain.).    [name] nitrofurantoin (MACRODANTIN) 50 MG capsule Take 1 capsule (50 mg total) by mouth daily. 90 capsule 1  . SYNTHROID 137 MCG tablet TAKE 1 TABLET DAILY 90 tablet 1  . TOPROL XL 50 MG 24 hr tablet TAKE 1 TABLET DAILY 90 tablet 3   No current facility-administered medications on file prior to visit.     PAST MEDICAL HISTORY: Past Medical History:  Diagnosis Date  . Arthritis    knees, back  . Cancer ([place])   . Carcinoid tumor of lung 2012   right found incidentally on chest xray eval for r/o vasxculitis   . Carpal tunnel syndrome, bilateral   . Coronary artery calcification 07/23/2016  . Double vision    history, no current problem  . Dysrhythmia    irregular occassional, no current problem  . Frequent UTI    . HNP (herniated nucleus pulposus), cervical 01/22/2011   C6-C7  Has seen dr Vertell Limber for this and Dr Tonita Cong  Positional numbness in hands no weakness    . Hypertension   . Hypothyroidism   . Infertility, female   . 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 Obstructive sleep apnea 07/27/2014   NPSG 07/2014:  AHI 85/hr, optimal cpap 10cm.  Download 08/2014:  Good compliance, breakthru apnea on 10cm.  Changed to auto 5-12cm >> good control of AHI on f/u download ONO on CPAP:      . Osteoarthritis   . PCOS (polycystic ovarian syndrome)   . SUI (stress urinary incontinence, female)    h/o    PAST SURGICAL HISTORY: Past Surgical History:  Procedure Laterality Date  . ANTERIOR CERVICAL  DECOMP/DISCECTOMY FUSION  12/24/2017   Procedure: Cervical five-six Cervical six-seven Anterior cervical decompression/discectomy/fusion;  Surgeon: Erline Levine, MD;  Location: [facility];  Service: Neurosurgery;;  . Wilmon Pali RELEASE Left 12/24/2017   Procedure: LEFT CARPAL TUNNEL RELEASE;  Surgeon: Erline Levine, MD;  Location: [facility];  Service: Neurosurgery;  Laterality: Left;  . CESAREAN SECTION     x 1 - twins  . COLONOSCOPY    . DILATATION & CURRETTAGE/HYSTEROSCOPY WITH RESECTOCOPE N/A 07/18/2013   Procedure: DILATATION & CURETTAGE/HYSTEROSCOPY WITH RESECTOCOPE;  Surgeon: Lyman Speller, MD;  Location: [facility];  Service: Gynecology;  Laterality: N/A;  mass resection  . Lung tumor removed     Rt lung carcinoid  . MOUTH SURGERY     pre-cancerous ulcer removed  . SHOULDER OPEN ROTATOR CUFF REPAIR  07/20/2010   Right  . WISDOM TOOTH EXTRACTION      SOCIAL HISTORY: Social History   Tobacco Use  . Smoking status: Never Smoker  . Smokeless tobacco: Never Used  Substance Use Topics  . Alcohol use: Yes    Alcohol/week: 2.0 standard drinks    Types: 2 Glasses of wine per week    Comment: occasional  . Drug use: No    FAMILY HISTORY: Family History  Problem Relation Age of Onset  . Stroke  Mother        died age 49  . Hypertension Mother   . Sudden death Mother   . Alcoholism Mother   . Coronary artery disease Father        died age 63  . Hypertension Father   . Liver disease Father   . Alcohol abuse Father   . Obesity Father   . Parkinson's disease Brother   . Diabetes Brother   . Hypertension Brother   . Hypertension Maternal Uncle   . Colon cancer Neg Hx   . Rectal cancer Neg Hx   . Stomach cancer Neg Hx     ROS: Review of Systems  Constitutional: Positive for malaise/fatigue. Negative for weight loss.       + Trouble sleeping  HENT: Positive for tinnitus.   Eyes:       + Wear glasses or contacts  Respiratory: Positive for cough and shortness of breath (with exertion).   Cardiovascular: Positive for palpitations. Negative for chest pain and orthopnea.       Negative chest pressure  Gastrointestinal: Negative for nausea.  Genitourinary: Positive for frequency.  Musculoskeletal:       + Muscle or joint pain + Muscle stiffness  Skin:       + Dryness  Neurological: Negative for headaches.  Endo/Heme/Allergies: Negative for polydipsia.       Negative hypoglycemia    PHYSICAL EXAM: Blood pressure (!) 163/79, pulse 65, temperature 97.6 F (36.4 C), temperature source Oral, height 5' (1.524 m), weight 238 lb (108 kg), last menstrual period 02/12/2011, SpO2 96 %. Body mass index is 46.48 kg/m. Physical Exam Vitals signs reviewed.  Constitutional:      Appearance: Normal appearance. She is obese.  HENT:     Head: Normocephalic and atraumatic.     Nose: Nose normal.  Eyes:     General: No scleral icterus.    Extraocular Movements: Extraocular movements intact.  Neck:     Musculoskeletal: Normal range of motion and neck supple.     Comments: No thyromegaly present Cardiovascular:     Rate and Rhythm: Normal rate and regular rhythm.     Pulses: Normal pulses.  Heart sounds: Normal heart sounds.  Pulmonary:     Effort: Pulmonary effort is  normal. No respiratory distress.     Breath sounds: Normal breath sounds.  Abdominal:     Palpations: Abdomen is soft.     Tenderness: There is no abdominal tenderness.     Comments: + Obesity  Musculoskeletal: Normal range of motion.     Right lower leg: No edema.     Left lower leg: No edema.  Neurological:     Mental Status: She is alert and oriented to person, place, and time.     Coordination: Coordination normal.  Psychiatric:        Mood and Affect: Mood normal.        Behavior: Behavior normal.     RECENT LABS AND TESTS: BMET    Component Value Date/Time   NA 140 01/12/2019 1040   K 3.9 01/12/2019 1040   CL 98 01/12/2019 1040   CO2 27 01/12/2019 1040   GLUCOSE 108 (H) 01/12/2019 1040   GLUCOSE 100 (H) 06/07/2018 0739   BUN 22 01/12/2019 1040   CREATININE 0.64 01/12/2019 1040   CALCIUM 9.5 01/12/2019 1040   GFRNONAA 95 01/12/2019 1040   GFRAA 109 01/12/2019 1040   Lab Results  Component Value Date   HGBA1C 5.9 (H) 01/12/2019   Lab Results  Component Value Date   INSULIN 20.4 01/12/2019   CBC    Component Value Date/Time   WBC 7.8 01/12/2019 1040   WBC 7.3 06/07/2018 0739   RBC 5.24 01/12/2019 1040   RBC 4.86 06/07/2018 0739   HGB 16.3 (H) 01/12/2019 1040   HGB 15.4 03/13/2014 1422   HCT 47.8 (H) 01/12/2019 1040   HCT 45.3 03/13/2014 1422   PLT 228.0 06/07/2018 0739   PLT 245 03/13/2014 1422   MCV 91 01/12/2019 1040   MCV 89.0 03/13/2014 1422   MCH 31.1 01/12/2019 1040   MCH 31.3 12/21/2017 1049   MCHC 34.1 01/12/2019 1040   MCHC 34.3 06/07/2018 0739   RDW 12.0 01/12/2019 1040   RDW 13.2 03/13/2014 1422   LYMPHSABS 2.3 01/12/2019 1040   LYMPHSABS 3.2 03/13/2014 1422   MONOABS 0.6 06/07/2018 0739   MONOABS 0.7 03/13/2014 1422   EOSABS 0.3 01/12/2019 1040   BASOSABS 0.1 01/12/2019 1040   BASOSABS 0.0 03/13/2014 1422   Iron/TIBC/Ferritin/ %Sat No results found for: IRON, TIBC, FERRITIN, IRONPCTSAT Lipid Panel     Component Value Date/Time     CHOL 133 01/12/2019 1040   CHOL 110 11/03/2016 0000   TRIG 62 01/12/2019 1040   TRIG 54 11/03/2016 0000   HDL 57 01/12/2019 1040   HDL 48 (L) 11/03/2016 0000   CHOLHDL 2 06/07/2018 0739   VLDL 10.8 06/07/2018 0739   LDLCALC 64 01/12/2019 1040   LDLCALC 49 11/03/2016 0000   Hepatic Function Panel     Component Value Date/Time   PROT 7.3 01/12/2019 1040   ALBUMIN 4.2 01/12/2019 1040   AST 24 01/12/2019 1040   ALT 26 01/12/2019 1040   ALKPHOS 91 01/12/2019 1040   BILITOT 0.6 01/12/2019 1040   BILIDIR 0.1 06/07/2018 0739   IBILI 0.4 09/08/2016 0749      Component Value Date/Time   TSH 2.490 01/12/2019 1040   TSH 3.66 06/07/2018 0739   TSH 1.40 04/30/2017 0838    ECG  shows NSR with a rate of 64 BPM INDIRECT CALORIMETER done today shows a VO2 of 252 and a REE of 1756.  Her calculated  basal metabolic rate is 6294 thus her basal metabolic rate is better than expected.       OBESITY BEHAVIORAL INTERVENTION VISIT  Today's visit was # 1   Starting weight: 238 lbs Starting date: 01/12/2019 Today's weight : 238 lbs  Today's date: 01/12/2019 Total lbs lost to date: 0    ASK: We discussed the diagnosis of obesity with Janifer Adie today and [name] agreed to give [name] permission to discuss obesity behavioral modification therapy today.  ASSESS: Pamalee has the diagnosis of obesity and her BMI today is 46.48 Jonnell is in the action stage of change   ADVISE: Donetta was educated on the multiple health risks of obesity as well as the benefit of weight loss to improve her health. She was advised of the need for long term treatment and the importance of lifestyle modifications to improve her current health and to decrease her risk of future health problems.  AGREE: Multiple dietary modification options and treatment options were discussed and  Isatou agreed to follow the recommendations documented in the above note.  ARRANGE: Elisabetta was educated on the importance of frequent  visits to treat obesity as outlined per CMS and USPSTF guidelines and agreed to schedule her next follow up appointment today.  I, Trixie Dredge, am acting as transcriptionist for Ilene Qua, MD  I have reviewed the above documentation for accuracy and completeness, and I agree with the above. - Ilene Qua, MD

## 2019-01-25 ENCOUNTER — Ambulatory Visit (INDEPENDENT_AMBULATORY_CARE_PROVIDER_SITE_OTHER): Admitting: Family Medicine

## 2019-01-26 ENCOUNTER — Ambulatory Visit (INDEPENDENT_AMBULATORY_CARE_PROVIDER_SITE_OTHER): Admitting: Family Medicine

## 2019-01-26 ENCOUNTER — Encounter (INDEPENDENT_AMBULATORY_CARE_PROVIDER_SITE_OTHER): Payer: Self-pay | Admitting: Family Medicine

## 2019-01-26 VITALS — BP 145/81 | HR 62 | Temp 97.9°F | Ht 60.0 in | Wt 236.0 lb

## 2019-01-26 DIAGNOSIS — Z9189 Other specified personal risk factors, not elsewhere classified: Secondary | ICD-10-CM

## 2019-01-26 DIAGNOSIS — Z6841 Body Mass Index (BMI) 40.0 and over, adult: Secondary | ICD-10-CM

## 2019-01-26 DIAGNOSIS — F3289 Other specified depressive episodes: Secondary | ICD-10-CM

## 2019-01-26 DIAGNOSIS — E559 Vitamin D deficiency, unspecified: Secondary | ICD-10-CM | POA: Diagnosis not present

## 2019-01-26 DIAGNOSIS — R7303 Prediabetes: Secondary | ICD-10-CM | POA: Diagnosis not present

## 2019-01-26 MED ORDER — VITAMIN D (ERGOCALCIFEROL) 1.25 MG (50000 UNIT) PO CAPS: 50000.0000 [IU] | ORAL_CAPSULE | ORAL | 0 refills | Status: DC

## 2019-01-27 NOTE — Progress Notes (Signed)
Office: [phone]  /  Fax: [phone]   HPI:   Chief Complaint: OBESITY Nichole Smith is here to discuss her progress with her obesity treatment plan. She is on the Category 3 plan and is following her eating plan approximately 90 % of the time. She states she is doing water aerobic, walking, and weight lifting for 30-60 minutes 5 times per week. Nichole Smith voices the 1st week she did perfectly, but struggled with a binge a few days ago, and has felt terribly since. She notes only occasionally hungry. She is eating slightly less than normal.  Her weight is 236 lb (107 kg) today and has had a weight loss of 2 pounds over a period of 2 weeks since her last visit. She has lost 2 lbs since starting treatment with [name].  Vitamin D Deficiency Nichole Smith has a diagnosis of vitamin D deficiency. She is not currently taking OTC Vit D. She notes fatigue and denies nausea, vomiting or muscle weakness.  Pre-Diabetes Nichole Smith has a diagnosis of pre-diabetes based on her elevated Hgb A1c, and this is not a new diagnosis. She was informed this puts her at greater risk of developing diabetes. She is not taking metformin currently and notes carbohydrate cravings continues to work on diet and exercise to decrease risk of diabetes. She denies nausea or hypoglycemia.  At risk for diabetes Nichole Smith is at higher than average risk for developing diabetes due to her obesity and pre-diabetes. She currently denies polyuria or polydipsia.  Depression with emotional eating behaviors Nichole Smith is struggling with with binge episodes, emotional eating and using food for comfort to the extent that it is negatively impacting her health. She often snacks when she is not hungry. Nichole Smith sometimes feels she is out of control and then feels guilty that she made poor food choices. She has been working on behavior modification techniques to help reduce her emotional eating and has been somewhat successful. She shows no sign of suicidal or homicidal  ideations.  Depression screen Nichole Smith 2/9 01/12/2019 04/21/2017 05/27/2015 02/22/2014  Decreased Interest 3 0 0 0  Down, Depressed, Hopeless 1 0 0 0  PHQ - 2 Score 4 0 0 0  Altered sleeping 2 - - -  Tired, decreased energy 3 - - -  Change in appetite 1 - - -  Feeling bad or failure about yourself  3 - - -  Trouble concentrating 1 - - -  Moving slowly or fidgety/restless 0 - - -  Suicidal thoughts 0 - - -  PHQ-9 Score 14 - - -  Difficult doing work/chores Somewhat difficult - - -  Some recent data might be hidden    ASSESSMENT AND PLAN:  Vitamin D deficiency - Plan: Vitamin D, Ergocalciferol, (DRISDOL) 1.25 MG (50000 UT) CAPS capsule  Prediabetes  Other depression - with emotional eating  At risk for diabetes mellitus  Class 3 severe obesity with serious comorbidity and body mass index (BMI) of 45.0 to 49.9 in adult, unspecified obesity type (HCC)  PLAN:  Vitamin D Deficiency Nichole Smith was informed that low vitamin D levels contributes to fatigue and are associated with obesity, breast, and colon cancer. Skylen agrees to start prescription Vit D @50 ,000 IU every week #4 with no refills. She will follow up for routine testing of vitamin D, at least 2-3 times per year. She was informed of the risk of over-replacement of vitamin D and agrees to not increase her dose unless she discusses this with [name] first. Yui agrees to follow up  with our clinic in 2 weeks.  Pre-Diabetes Nichole Smith will continue to work on weight loss, exercise, and decreasing simple carbohydrates in her diet to help decrease the risk of diabetes. We dicussed metformin including benefits and risks. She was informed that eating too many simple carbohydrates or too many calories at one sitting increases the likelihood of GI side effects. Katrina declined metformin for now and a prescription was not written today. Preeya agrees to follow up with our clinic in 2 weeks as directed to monitor her progress.  Diabetes risk counseling Nichole Smith  was given extended (30 minutes) diabetes prevention counseling today. She is 63 y.o. female and has risk factors for diabetes including obesity and pre-diabetes. We discussed intensive lifestyle modifications today with an emphasis on weight loss as well as increasing exercise and decreasing simple carbohydrates in her diet.  Depression with Emotional Eating Behaviors We discussed behavior modification techniques today to help Nichole Smith deal with her emotional eating and depression. We will refer to Dr. Mallie Mussel, our bariatric psychologist. Nichole Smith agrees to follow up with our clinic in 2 weeks.  Obesity Nichole Smith is currently in the action stage of change. As such, her goal is to continue with weight loss efforts She has agreed to follow the Category 3 plan Nichole Smith has been instructed to work up to a goal of 150 minutes of combined cardio and strengthening exercise per week for weight loss and overall health benefits. We discussed the following Behavioral Modification Strategies today: increasing lean protein intake, increasing vegetables, work on meal planning and easy cooking plans, better snacking choices, and planning for success   Nichole Smith has agreed to follow up with our clinic in 2 weeks. She was informed of the importance of frequent follow up visits to maximize her success with intensive lifestyle modifications for her multiple health conditions.  ALLERGIES: Allergies  Allergen Reactions  . Neomycin Other (See Comments)    Inflammation--eye drop   . Ace Inhibitors Cough    MEDICATIONS: Current Outpatient Medications on File Prior to Visit  Medication Sig Dispense Refill  . aspirin EC 81 MG tablet Take 81 mg by mouth at bedtime.    [name] atorvastatin (LIPITOR) 40 MG tablet TAKE 1 TABLET DAILY 90 tablet 3  . CALCIUM PO Take 1 tablet by mouth at bedtime.     [name] diltiazem (CARDIZEM CD) 180 MG 24 hr capsule Take 1 capsule (180 mg total) by mouth at bedtime. 90 capsule 3  . estradiol (ESTRACE) 0.1 MG/GM  vaginal cream 1 gram pv twice weekly 42.5 g 3  . hydrochlorothiazide (MICROZIDE) 12.5 MG capsule TAKE 1 CAPSULE DAILY.   SCHEDULE ANNUAL VISIT 90 capsule 4  . Multiple Vitamins-Minerals (PRESERVISION AREDS 2 PO) Take 1 tablet by mouth 2 (two) times daily.    . naproxen sodium (ALEVE) 220 MG tablet Take 220 mg by mouth 2 (two) times daily as needed (for pain.).    [name] nitrofurantoin (MACRODANTIN) 50 MG capsule Take 1 capsule (50 mg total) by mouth daily. 90 capsule 1  . SYNTHROID 137 MCG tablet TAKE 1 TABLET DAILY 90 tablet 1  . TOPROL XL 50 MG 24 hr tablet TAKE 1 TABLET DAILY 90 tablet 3   No current facility-administered medications on file prior to visit.     PAST MEDICAL HISTORY: Past Medical History:  Diagnosis Date  . Arthritis    knees, back  . Cancer ([place])   . Carcinoid tumor of lung 2012   right found incidentally on chest xray eval for r/o  vasxculitis   . Carpal tunnel syndrome, bilateral   . Coronary artery calcification 07/23/2016  . Double vision    history, no current problem  . Dysrhythmia    irregular occassional, no current problem  . Frequent UTI   . HNP (herniated nucleus pulposus), cervical 01/22/2011   C6-C7  Has seen dr Vertell Limber for this and Dr Tonita Cong  Positional numbness in hands no weakness    . Hypertension   . Hypothyroidism   . Infertility, female   . 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 Obstructive sleep apnea 07/27/2014   NPSG 07/2014:  AHI 85/hr, optimal cpap 10cm.  Download 08/2014:  Good compliance, breakthru apnea on 10cm.  Changed to auto 5-12cm >> good control of AHI on f/u download ONO on CPAP:      . Osteoarthritis   . PCOS (polycystic ovarian syndrome)   . SUI (stress urinary incontinence, female)    h/o    PAST SURGICAL HISTORY: Past Surgical History:  Procedure Laterality Date  . ANTERIOR CERVICAL DECOMP/DISCECTOMY FUSION  12/24/2017   Procedure: Cervical five-six Cervical six-seven Anterior cervical decompression/discectomy/fusion;   Surgeon: Erline Levine, MD;  Location: [place];  Service: Neurosurgery;;  . Wilmon Pali RELEASE Left 12/24/2017   Procedure: LEFT CARPAL TUNNEL RELEASE;  Surgeon: Erline Levine, MD;  Location: [place];  Service: Neurosurgery;  Laterality: Left;  . CESAREAN SECTION     x 1 - twins  . COLONOSCOPY    . DILATATION & CURRETTAGE/HYSTEROSCOPY WITH RESECTOCOPE N/A 07/18/2013   Procedure: DILATATION & CURETTAGE/HYSTEROSCOPY WITH RESECTOCOPE;  Surgeon: Lyman Speller, MD;  Location: [facility];  Service: Gynecology;  Laterality: N/A;  mass resection  . Lung tumor removed     Rt lung carcinoid  . MOUTH SURGERY     pre-cancerous ulcer removed  . SHOULDER OPEN ROTATOR CUFF REPAIR  07/20/2010   Right  . WISDOM TOOTH EXTRACTION      SOCIAL HISTORY: Social History   Tobacco Use  . Smoking status: Never Smoker  . Smokeless tobacco: Never Used  Substance Use Topics  . Alcohol use: Yes    Alcohol/week: 2.0 standard drinks    Types: 2 Glasses of wine per week    Comment: occasional  . Drug use: No    FAMILY HISTORY: Family History  Problem Relation Age of Onset  . Stroke Mother        died age 80  . Hypertension Mother   . Sudden death Mother   . Alcoholism Mother   . Coronary artery disease Father        died age 74  . Hypertension Father   . Liver disease Father   . Alcohol abuse Father   . Obesity Father   . Parkinson's disease Brother   . Diabetes Brother   . Hypertension Brother   . Hypertension Maternal Uncle   . Colon cancer Neg Hx   . Rectal cancer Neg Hx   . Stomach cancer Neg Hx     ROS: Review of Systems  Constitutional: Positive for malaise/fatigue and weight loss.  Gastrointestinal: Negative for nausea and vomiting.  Genitourinary: Negative for frequency.  Musculoskeletal:       Negative muscle weakness  Endo/Heme/Allergies: Negative for polydipsia.       Negative hypoglycemia  Psychiatric/Behavioral: Positive for depression. Negative for suicidal ideas.     PHYSICAL EXAM: Blood pressure (!) 145/81, pulse 62, temperature 97.9 F (36.6 C), temperature source Oral, height 5' (1.524 m), weight 236 lb (107 kg), last  menstrual period 02/12/2011, SpO2 98 %. Body mass index is 46.09 kg/m. Physical Exam Vitals signs reviewed.  Constitutional:      Appearance: Normal appearance. She is obese.  Cardiovascular:     Rate and Rhythm: Normal rate.     Pulses: Normal pulses.  Pulmonary:     Effort: Pulmonary effort is normal.     Breath sounds: Normal breath sounds.  Musculoskeletal: Normal range of motion.  Skin:    General: Skin is warm and dry.  Neurological:     Mental Status: She is alert and oriented to person, place, and time.  Psychiatric:        Mood and Affect: Mood normal.        Behavior: Behavior normal.     RECENT LABS AND TESTS: BMET    Component Value Date/Time   NA 140 01/12/2019 1040   K 3.9 01/12/2019 1040   CL 98 01/12/2019 1040   CO2 27 01/12/2019 1040   GLUCOSE 108 (H) 01/12/2019 1040   GLUCOSE 100 (H) 06/07/2018 0739   BUN 22 01/12/2019 1040   CREATININE 0.64 01/12/2019 1040   CALCIUM 9.5 01/12/2019 1040   GFRNONAA 95 01/12/2019 1040   GFRAA 109 01/12/2019 1040   Lab Results  Component Value Date   HGBA1C 5.9 (H) 01/12/2019   HGBA1C 5.9 06/07/2018   Lab Results  Component Value Date   INSULIN 20.4 01/12/2019   CBC    Component Value Date/Time   WBC 7.8 01/12/2019 1040   WBC 7.3 06/07/2018 0739   RBC 5.24 01/12/2019 1040   RBC 4.86 06/07/2018 0739   HGB 16.3 (H) 01/12/2019 1040   HGB 15.4 03/13/2014 1422   HCT 47.8 (H) 01/12/2019 1040   HCT 45.3 03/13/2014 1422   PLT 228.0 06/07/2018 0739   PLT 245 03/13/2014 1422   MCV 91 01/12/2019 1040   MCV 89.0 03/13/2014 1422   MCH 31.1 01/12/2019 1040   MCH 31.3 12/21/2017 1049   MCHC 34.1 01/12/2019 1040   MCHC 34.3 06/07/2018 0739   RDW 12.0 01/12/2019 1040   RDW 13.2 03/13/2014 1422   LYMPHSABS 2.3 01/12/2019 1040   LYMPHSABS 3.2 03/13/2014  1422   MONOABS 0.6 06/07/2018 0739   MONOABS 0.7 03/13/2014 1422   EOSABS 0.3 01/12/2019 1040   BASOSABS 0.1 01/12/2019 1040   BASOSABS 0.0 03/13/2014 1422   Iron/TIBC/Ferritin/ %Sat No results found for: IRON, TIBC, FERRITIN, IRONPCTSAT Lipid Panel     Component Value Date/Time   CHOL 133 01/12/2019 1040   CHOL 110 11/03/2016 0000   TRIG 62 01/12/2019 1040   TRIG 54 11/03/2016 0000   HDL 57 01/12/2019 1040   HDL 48 (L) 11/03/2016 0000   CHOLHDL 2 06/07/2018 0739   VLDL 10.8 06/07/2018 0739   LDLCALC 64 01/12/2019 1040   LDLCALC 49 11/03/2016 0000   Hepatic Function Panel     Component Value Date/Time   PROT 7.3 01/12/2019 1040   ALBUMIN 4.2 01/12/2019 1040   AST 24 01/12/2019 1040   ALT 26 01/12/2019 1040   ALKPHOS 91 01/12/2019 1040   BILITOT 0.6 01/12/2019 1040   BILIDIR 0.1 06/07/2018 0739   IBILI 0.4 09/08/2016 0749      Component Value Date/Time   TSH 2.490 01/12/2019 1040   TSH 3.66 06/07/2018 0739   TSH 1.40 04/30/2017 0838      OBESITY BEHAVIORAL INTERVENTION VISIT  Today's visit was # 2   Starting weight: 238 lbs Starting date: 01/12/2019 Today's weight : 236  lbs  Today's date: 01/26/2019 Total lbs lost to date: 2    01/26/2019  Height 5' (1.524 m)  Weight 236 lb (107 kg)  BMI (Calculated) 46.09  BLOOD PRESSURE - SYSTOLIC 978  BLOOD PRESSURE - DIASTOLIC 81   Body Fat % 47.8 %  Total Body Water (lbs) 83 lbs     ASK: We discussed the diagnosis of obesity with Nichole Smith today and Nichole Smith agreed to give [name] permission to discuss obesity behavioral modification therapy today.  ASSESS: Nichole Smith has the diagnosis of obesity and her BMI today is 46.09 Nichole Smith is in the action stage of change   ADVISE: Nichole Smith was educated on the multiple health risks of obesity as well as the benefit of weight loss to improve her health. She was advised of the need for long term treatment and the importance of lifestyle modifications to improve her current health  and to decrease her risk of future health problems.  AGREE: Multiple dietary modification options and treatment options were discussed and  Nichole Smith agreed to follow the recommendations documented in the above note.  ARRANGE: Harpreet was educated on the importance of frequent visits to treat obesity as outlined per CMS and USPSTF guidelines and agreed to schedule her next follow up appointment today.  I, Trixie Dredge, am acting as transcriptionist for Ilene Qua, MD  I have reviewed the above documentation for accuracy and completeness, and I agree with the above. - Ilene Qua, MD

## 2019-02-14 NOTE — Progress Notes (Unsigned)
Office: [phone]  /  Fax: [phone]    Date: 02/15/2019  Time Seen: *** Duration: *** Provider: Glennie Isle, PsyD Type of Session: Intake for Individual Therapy  Type of Contact: Face-to-face  Informed Consent:The provider's role was explained to Janifer Adie. The provider reviewed and discussed issues of confidentiality, privacy, and limits therein. In addition to verbal informed consent, written informed consent for psychological services was obtained from [place] prior to the initial intake interview. Written consent included information concerning the practice, financial arrangements, and confidentiality and patients' rights. Since the clinic is not a 24/7 crisis center, mental health emergency resources were shared in the form of a handout, and the provider explained MyChart, e-mail, voicemail, and/or other messaging systems should be utilized only for non-emergency reasons. Persephone verbally acknowledged understanding of the aforementioned, and agreed to use mental health emergency resources discussed if needed. Moreover, Shellee agreed information may be shared with other [facility] providers as needed for coordination of care, and written consent was obtained.   Chief Complaint: Nichole Smith was referred by Dr. Ilene Qua due to depression with emotional eating behaviors. Per the note for the visit with Dr. Ilene Qua on 01/26/2019, "[name] is struggling with with binge episodes, emotional eating and using food for comfort to the extent that it is negatively impacting her health. She often snacks when she is not hungry. [name] sometimes feels she is out of control and then feels guilty that she made poor food choices. She has been working on behavior modification techniques to help reduce her emotional eating and has been somewhat successful. She shows no sign of suicidal or homicidal ideations."  During today's appointment, Tonji reported ***  Coila  was asked to complete a questionnaire assessing various behaviors related to emotional eating. Tennelle endorsed the following: {gbmoodandfood:21755}.  HPI: Per the note for the initial visit with Dr. Ilene Qua on 01/12/2019, [name] described herself as a picky eater and she does not like to eat healthier foods. During the initial appointment with Dr. Ilene Qua, [name] further reported experiencing the following: significant food cravings issues , frequently drinking liquids with calories, frequently making poor food choices, frequently eating larger portions than normal , struggling with emotional eating and having problems with excessive hunger.   During today's appointment, [name] reported ***  Mental Status Examination: [name] arrived on time for the appointment. She presented as appropriately dressed and groomed. [name] appeared her stated age and demonstrated adequate orientation to time, place, person, and purpose of the appointment. She also demonstrated appropriate eye contact. No psychomotor abnormalities or behavioral peculiarities noted. Her mood was {gbmood:21757} with congruent affect. Her thought processes were logical, linear, and goal-directed. No hallucinations, delusions, bizarre thinking or behavior reported or observed. Judgment, insight, and impulse control appeared to be grossly intact. There was no evidence of paraphasias (i.e., errors in speech, gross mispronunciations, and word substitutions), repetition deficits, or disturbances in volume or prosody (i.e., rhythm and intonation). There was no evidence of attention or memory impairments. [name] denied current suicidal and homicidal ideation, plan, and intent.   The Mini-Mental State Examination, Second Edition (MMSE-2) was administered. The MMSE-2 briefly screens for cognitive dysfunction and overall mental status and assesses different cognitive domains: orientation, registration, attention and calculation, recall, and  language and praxis. [name] received *** out of 30 points possible on the MMSE-2, which is noted in the *** range.   Family & Psychosocial History: ***  Medical History: ***  Mental Health History: ***  [name] denied a trauma history, including {gbtrauma:22071} abuse, as well as neglect. [name] reported experiencing the following: {gbintakesxs:21966}  [name] reported experiencing worry thoughts regarding the following:   [name] denied experiencing the following: {gbsxs:21965}  She also denied history of and current suicidal ideation, plan, and intent; history of and current homicidal ideation, plan, and intent; and history of and current engagement in self-harm.  The following strengths were reported by [name]:*** The following strengths were observed by this provider: {gbstrengths:22223}.  Legal History: [name] {gblegal:22371} a history of legal involvement.   Structured Assessment Results: The Patient Health Questionnaire-9 (PHQ-9) is a self-report measure that assesses symptoms and severity of depression over the course of the last two weeks. [name] obtained a score of *** suggesting {GBPHQ9SEVERITY:21752}. [name] finds the endorsed symptoms to be {gbphq9difficulty:21754}.  The Generalized Anxiety Disorder-7 (GAD-7) is a brief self-report measure that assesses symptoms of anxiety over the course of the last two weeks. [name] obtained a score of *** suggesting {gbgad7severity:21753}.  Interventions: A chart review was conducted prior to the clinical intake interview. The MMSE-2***, PHQ-9, and GAD-7 were administered and a clinical intake interview was completed. In addition, [name] was asked to complete a Mood and Food questionnaire to assess various behaviors related to emotional eating. Throughout session, empathic reflections and validation was provided. Continuing treatment with this provider was discussed and a treatment goal was established. Psychoeducation regarding emotional versus  physical hunger was provided. [name] was given a handout to utilize between now and the next appointment to increase awareness of hunger patterns and subsequent eating. ***  Provisional DSM-5 Diagnosis: ***  Plan: Charisma appears able and willing to participate as evidenced by collaboration on a treatment goal, engagement in reciprocal conversation, and asking questions as needed for clarification. The next appointment will be scheduled in {gbweeks:21758}. The following treatment goal was established: {gbtxgoals:21759}. For the aforementioned goal, Sarann can benefit from biweekly individual therapy sessions that are brief in duration for approximately four to six sessions. The treatment modality will be individual therapeutic services, including an eclectic therapeutic approach utilizing techniques from Cognitive Behavioral Therapy, Patient Centered Therapy, Dialectical Behavior Therapy, Acceptance and Commitment Therapy, Interpersonal Therapy, and Cognitive Restructuring. Therapeutic approach will include various interventions as appropriate, such as validation, support, mindfulness, thought defusion, reframing, psychoeducation, values assessment, and role playing. This provider will regularly review the treatment plan and medical chart to keep informed of status changes. Marieli expressed understanding and agreement with the initial treatment plan of care.

## 2019-02-15 ENCOUNTER — Other Ambulatory Visit (INDEPENDENT_AMBULATORY_CARE_PROVIDER_SITE_OTHER): Payer: Self-pay | Admitting: Family Medicine

## 2019-02-15 ENCOUNTER — Encounter (INDEPENDENT_AMBULATORY_CARE_PROVIDER_SITE_OTHER): Payer: Self-pay

## 2019-02-15 ENCOUNTER — Ambulatory Visit (INDEPENDENT_AMBULATORY_CARE_PROVIDER_SITE_OTHER): Admitting: Psychology

## 2019-02-15 ENCOUNTER — Ambulatory Visit (INDEPENDENT_AMBULATORY_CARE_PROVIDER_SITE_OTHER): Admitting: Family Medicine

## 2019-02-15 DIAGNOSIS — E559 Vitamin D deficiency, unspecified: Secondary | ICD-10-CM

## 2019-02-20 ENCOUNTER — Encounter (INDEPENDENT_AMBULATORY_CARE_PROVIDER_SITE_OTHER): Payer: Self-pay | Admitting: Family Medicine

## 2019-02-21 ENCOUNTER — Other Ambulatory Visit (INDEPENDENT_AMBULATORY_CARE_PROVIDER_SITE_OTHER): Payer: Self-pay

## 2019-02-21 DIAGNOSIS — E559 Vitamin D deficiency, unspecified: Secondary | ICD-10-CM

## 2019-02-23 ENCOUNTER — Telehealth (INDEPENDENT_AMBULATORY_CARE_PROVIDER_SITE_OTHER): Payer: Self-pay | Admitting: Family Medicine

## 2019-02-23 NOTE — Telephone Encounter (Signed)
Patient states she is out of her Vit D.  She sent a My Chart msg last week and was told she needed to be scheduled.  She states she is and was scheduled for 4/1.  Pharmacy:  [facility] [address].

## 2019-02-28 ENCOUNTER — Encounter (INDEPENDENT_AMBULATORY_CARE_PROVIDER_SITE_OTHER): Payer: Self-pay

## 2019-02-28 ENCOUNTER — Ambulatory Visit (INDEPENDENT_AMBULATORY_CARE_PROVIDER_SITE_OTHER): Admitting: Psychology

## 2019-03-02 ENCOUNTER — Encounter (INDEPENDENT_AMBULATORY_CARE_PROVIDER_SITE_OTHER): Payer: Self-pay | Admitting: Family Medicine

## 2019-03-02 ENCOUNTER — Other Ambulatory Visit: Payer: Self-pay

## 2019-03-02 ENCOUNTER — Ambulatory Visit (INDEPENDENT_AMBULATORY_CARE_PROVIDER_SITE_OTHER): Admitting: Family Medicine

## 2019-03-02 DIAGNOSIS — E559 Vitamin D deficiency, unspecified: Secondary | ICD-10-CM | POA: Diagnosis not present

## 2019-03-02 DIAGNOSIS — Z6841 Body Mass Index (BMI) 40.0 and over, adult: Secondary | ICD-10-CM | POA: Diagnosis not present

## 2019-03-02 DIAGNOSIS — I10 Essential (primary) hypertension: Secondary | ICD-10-CM | POA: Diagnosis not present

## 2019-03-02 MED ORDER — VITAMIN D (ERGOCALCIFEROL) 1.25 MG (50000 UNIT) PO CAPS: 50000.0000 [IU] | ORAL_CAPSULE | ORAL | 0 refills | Status: DC

## 2019-03-03 NOTE — Progress Notes (Signed)
Office: [phone]  /  Fax: [phone] TeleHealth Visit:  Nichole Smith has verbally consented to this TeleHealth visit today. The patient is located at home, the provider is located at the [employer] [facility] office. The participants in this visit include the listed provider and patient and provider's assistant. The visit was conducted today via video on Facetime.  HPI:   Chief Complaint: OBESITY Nichole Smith is here to discuss her progress with her obesity treatment plan. She is on the Category 3 plan and is following her eating plan approximately 80 % of the time. She states she is doing cardio, walking, and yoga 6 days a week. Nichole Smith is weighing at home, and almost always daily. She is disappointed with 1 lb weight loss. She is logging on My[employer]itness Pal and is averaging around 1800 calories per day. She is doing some boredom eating.  We were unable to weigh the patient today for this TeleHealth visit. She feels as if she has lost 1 lb since her last visit. She has lost 2-3 lbs since starting treatment with [name].  Vitamin D Deficiency Nichole Smith has a diagnosis of vitamin D deficiency. She is currently taking prescription Vit D. She notes fatigue and denies nausea, vomiting or muscle weakness.  Hypertension Nichole Smith is a 63 y.o. female with hypertension. Nichole Smith's blood pressure is controlled. She denies chest pain, chest pressure, or headaches. She is working on weight loss to help control her blood pressure with the goal of decreasing her risk of heart attack and stroke.   ASSESSMENT AND PLAN:  Vitamin D deficiency - Plan: Vitamin D, Ergocalciferol, (DRISDOL) 1.25 MG (50000 UT) CAPS capsule  Essential hypertension  Class 3 severe obesity with serious comorbidity and body mass index (BMI) of 45.0 to 49.9 in adult, unspecified obesity type ([place])  PLAN:  Vitamin D Deficiency Nichole Smith was informed that low vitamin D levels contributes to fatigue and are associated with obesity,  breast, and colon cancer. Nichole Smith agrees to continue taking prescription Vit D @50 ,000 IU every week #4 and we will refill for 1 month. She will follow up for routine testing of vitamin D, at least 2-3 times per year. She was informed of the risk of over-replacement of vitamin D and agrees to not increase her dose unless she discusses this with [name] first. Nichole Smith agrees to follow up with our clinic in 2 weeks.  Hypertension We discussed sodium restriction, working on healthy weight loss, and a regular exercise program as the means to achieve improved blood pressure control. Nichole Smith agreed with this plan and agreed to follow up as directed. We will continue to monitor her blood pressure as well as her progress with the above lifestyle modifications. Nichole Smith agrees to continue her current medications and will watch for signs of hypotension as she continues her lifestyle modifications. Nichole Smith agrees to follow up with our clinic in 2 weeks.  Obesity Nichole Smith is currently in the action stage of change. As such, her goal is to continue with weight loss efforts She has agreed to keep a food journal with 1450-1600 calories and 100+ grams of protein daily or follow the Category 3 plan Nichole Smith has been instructed to work up to a goal of 150 minutes of combined cardio and strengthening exercise per week or continue physical activity 5 times per week for up to 1 hour of low impact activity for weight loss and overall health benefits. We discussed the following Behavioral Modification Strategies today: increasing lean protein intake, increasing vegetables,  work on [employer] and easy cooking plans, better snacking choices, planning for success, and keep a strict food journal   Nichole Smith has agreed to follow up with our clinic in 2 weeks. She was informed of the importance of frequent follow up visits to maximize her success with intensive lifestyle modifications for her multiple health conditions.  ALLERGIES: Allergies   Allergen Reactions  . Neomycin Other (See Comments)    Inflammation--eye drop   . Ace Inhibitors Cough    MEDICATIONS: Current Outpatient Medications on File Prior to Visit  Medication Sig Dispense Refill  . aspirin EC 81 MG tablet Take 81 mg by mouth at bedtime.    [name] atorvastatin (LIPITOR) 40 MG tablet TAKE 1 TABLET DAILY 90 tablet 3  . CALCIUM PO Take 1 tablet by mouth at bedtime.     [name] diltiazem (CARDIZEM CD) 180 MG 24 hr capsule Take 1 capsule (180 mg total) by mouth at bedtime. 90 capsule 3  . estradiol (ESTRACE) 0.1 MG/GM vaginal cream 1 gram pv twice weekly 42.5 g 3  . hydrochlorothiazide (MICROZIDE) 12.5 MG capsule TAKE 1 CAPSULE DAILY.   SCHEDULE ANNUAL VISIT 90 capsule 4  . Multiple Vitamins-Minerals (PRESERVISION AREDS 2 PO) Take 1 tablet by mouth 2 (two) times daily.    . naproxen sodium (ALEVE) 220 MG tablet Take 220 mg by mouth 2 (two) times daily as needed (for pain.).    [name] nitrofurantoin (MACRODANTIN) 50 MG capsule Take 1 capsule (50 mg total) by mouth daily. 90 capsule 1  . SYNTHROID 137 MCG tablet TAKE 1 TABLET DAILY 90 tablet 1  . TOPROL XL 50 MG 24 hr tablet TAKE 1 TABLET DAILY 90 tablet 3   No current facility-administered medications on file prior to visit.     PAST MEDICAL HISTORY: Past Medical History:  Diagnosis Date  . Arthritis    knees, back  . Cancer ([place])   . Carcinoid tumor of lung 2012   right found incidentally on chest xray eval for r/o vasxculitis   . Carpal tunnel syndrome, bilateral   . Coronary artery calcification 07/23/2016  . Double vision    history, no current problem  . Dysrhythmia    irregular occassional, no current problem  . Frequent UTI   . HNP (herniated nucleus pulposus), cervical 01/22/2011   C6-C7  Has seen dr Vertell Limber for this and Dr Tonita Cong  Positional numbness in hands no weakness    . Hypertension   . Hypothyroidism   . Infertility, female   . 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 Obstructive sleep apnea  07/27/2014   NPSG 07/2014:  AHI 85/hr, optimal cpap 10cm.  Download 08/2014:  Good compliance, breakthru apnea on 10cm.  Changed to auto 5-12cm >> good control of AHI on f/u download ONO on CPAP:      . Osteoarthritis   . PCOS (polycystic ovarian syndrome)   . SUI (stress urinary incontinence, female)    h/o    PAST SURGICAL HISTORY: Past Surgical History:  Procedure Laterality Date  . ANTERIOR CERVICAL DECOMP/DISCECTOMY FUSION  12/24/2017   Procedure: Cervical five-six Cervical six-seven Anterior cervical decompression/discectomy/fusion;  Surgeon: Erline Levine, MD;  Location: [facility];  Service: Neurosurgery;;  . Wilmon Pali RELEASE Left 12/24/2017   Procedure: LEFT CARPAL TUNNEL RELEASE;  Surgeon: Erline Levine, MD;  Location: [facility];  Service: Neurosurgery;  Laterality: Left;  . CESAREAN SECTION     x 1 - twins  . COLONOSCOPY    . DILATATION & CURRETTAGE/HYSTEROSCOPY  WITH RESECTOCOPE N/A 07/18/2013   Procedure: DILATATION & CURETTAGE/HYSTEROSCOPY WITH RESECTOCOPE;  Surgeon: Lyman Speller, MD;  Location: [facility];  Service: Gynecology;  Laterality: N/A;  mass resection  . Lung tumor removed     Rt lung carcinoid  . MOUTH SURGERY     pre-cancerous ulcer removed  . SHOULDER OPEN ROTATOR CUFF REPAIR  07/20/2010   Right  . WISDOM TOOTH EXTRACTION      SOCIAL HISTORY: Social History   Tobacco Use  . Smoking status: Never Smoker  . Smokeless tobacco: Never Used  Substance Use Topics  . Alcohol use: Yes    Alcohol/week: 2.0 standard drinks    Types: 2 Glasses of wine per week    Comment: occasional  . Drug use: No    FAMILY HISTORY: Family History  Problem Relation Age of Onset  . Stroke Mother        died age 52  . Hypertension Mother   . Sudden death Mother   . Alcoholism Mother   . Coronary artery disease Father        died age 65  . Hypertension Father   . Liver disease Father   . Alcohol abuse Father   . Obesity Father   . Parkinson's disease Brother   .  Diabetes Brother   . Hypertension Brother   . Hypertension Maternal Uncle   . Colon cancer Neg Hx   . Rectal cancer Neg Hx   . Stomach cancer Neg Hx     ROS: Review of Systems  Constitutional: Positive for malaise/fatigue and weight loss.  Cardiovascular: Negative for chest pain.       Negative chest pressure  Gastrointestinal: Negative for nausea and vomiting.  Musculoskeletal:       Negative muscle weakness  Neurological: Negative for headaches.    PHYSICAL EXAM: Pt in no acute distress  RECENT LABS AND TESTS: BMET    Component Value Date/Time   NA 140 01/12/2019 1040   K 3.9 01/12/2019 1040   CL 98 01/12/2019 1040   CO2 27 01/12/2019 1040   GLUCOSE 108 (H) 01/12/2019 1040   GLUCOSE 100 (H) 06/07/2018 0739   BUN 22 01/12/2019 1040   CREATININE 0.64 01/12/2019 1040   CALCIUM 9.5 01/12/2019 1040   GFRNONAA 95 01/12/2019 1040   GFRAA 109 01/12/2019 1040   Lab Results  Component Value Date   HGBA1C 5.9 (H) 01/12/2019   HGBA1C 5.9 06/07/2018   Lab Results  Component Value Date   INSULIN 20.4 01/12/2019   CBC    Component Value Date/Time   WBC 7.8 01/12/2019 1040   WBC 7.3 06/07/2018 0739   RBC 5.24 01/12/2019 1040   RBC 4.86 06/07/2018 0739   HGB 16.3 (H) 01/12/2019 1040   HGB 15.4 03/13/2014 1422   HCT 47.8 (H) 01/12/2019 1040   HCT 45.3 03/13/2014 1422   PLT 228.0 06/07/2018 0739   PLT 245 03/13/2014 1422   MCV 91 01/12/2019 1040   MCV 89.0 03/13/2014 1422   MCH 31.1 01/12/2019 1040   MCH 31.3 12/21/2017 1049   MCHC 34.1 01/12/2019 1040   MCHC 34.3 06/07/2018 0739   RDW 12.0 01/12/2019 1040   RDW 13.2 03/13/2014 1422   LYMPHSABS 2.3 01/12/2019 1040   LYMPHSABS 3.2 03/13/2014 1422   MONOABS 0.6 06/07/2018 0739   MONOABS 0.7 03/13/2014 1422   EOSABS 0.3 01/12/2019 1040   BASOSABS 0.1 01/12/2019 1040   BASOSABS 0.0 03/13/2014 1422   Iron/TIBC/Ferritin/ %Sat No results found  for: IRON, TIBC, FERRITIN, IRONPCTSAT Lipid Panel     Component  Value Date/Time   CHOL 133 01/12/2019 1040   CHOL 110 11/03/2016 0000   TRIG 62 01/12/2019 1040   TRIG 54 11/03/2016 0000   HDL 57 01/12/2019 1040   HDL 48 (L) 11/03/2016 0000   CHOLHDL 2 06/07/2018 0739   VLDL 10.8 06/07/2018 0739   LDLCALC 64 01/12/2019 1040   LDLCALC 49 11/03/2016 0000   Hepatic Function Panel     Component Value Date/Time   PROT 7.3 01/12/2019 1040   ALBUMIN 4.2 01/12/2019 1040   AST 24 01/12/2019 1040   ALT 26 01/12/2019 1040   ALKPHOS 91 01/12/2019 1040   BILITOT 0.6 01/12/2019 1040   BILIDIR 0.1 06/07/2018 0739   IBILI 0.4 09/08/2016 0749      Component Value Date/Time   TSH 2.490 01/12/2019 1040   TSH 3.66 06/07/2018 0739   TSH 1.40 04/30/2017 0838      I, Trixie Dredge, am acting as transcriptionist for Ilene Qua, MD  I have reviewed the above documentation for accuracy and completeness, and I agree with the above. - Ilene Qua, MD

## 2019-03-16 ENCOUNTER — Other Ambulatory Visit: Payer: Self-pay

## 2019-03-16 ENCOUNTER — Encounter (INDEPENDENT_AMBULATORY_CARE_PROVIDER_SITE_OTHER): Payer: Self-pay | Admitting: Family Medicine

## 2019-03-16 ENCOUNTER — Ambulatory Visit (INDEPENDENT_AMBULATORY_CARE_PROVIDER_SITE_OTHER): Admitting: Family Medicine

## 2019-03-16 DIAGNOSIS — R7303 Prediabetes: Secondary | ICD-10-CM | POA: Diagnosis not present

## 2019-03-16 DIAGNOSIS — Z6841 Body Mass Index (BMI) 40.0 and over, adult: Secondary | ICD-10-CM

## 2019-03-16 DIAGNOSIS — E559 Vitamin D deficiency, unspecified: Secondary | ICD-10-CM

## 2019-03-22 NOTE — Progress Notes (Signed)
Office: [phone]  /  Fax: [phone] TeleHealth Visit:  Nichole Smith has verbally consented to this TeleHealth visit today. The patient is located at home, the provider is located at the [employer] [facility] office. The participants in this visit include the listed provider and patient. The visit was conducted today via face time.  HPI:   Chief Complaint: OBESITY Nichole Smith is here to discuss her progress with her obesity treatment plan. She is on the keep a food journal with 1450-1600 calories and 100+ grams of protein daily or follow the Category 3 plan and is following her eating plan approximately 75 % of the time. She states she is walking and exercising with videos for 1 hour 5 times per week. Nichole Smith finds staying under 1600 calories to be challenging and hard to get any variety. She is getting takeout a couple of times a week. She is finding it hard to include takeout and stay within calories. She notes it was hard to track on Easter weekend. She notes her blood pressure was 140/70. We were unable to weigh the patient today for this TeleHealth visit. She feels as if she has gained 1 lb since her last visit. She has lost 2 lbs since starting treatment with [name].  Vitamin D Deficiency Nichole Smith has a diagnosis of vitamin D deficiency. She is currently taking prescription Vit D. She notes being more fatigued than previous, possibly due to stress of pandemic. She denies nausea, vomiting or muscle weakness.  Pre-Diabetes Nichole Smith has a diagnosis of pre-diabetes based on her elevated Hgb A1c and was informed this puts her at greater risk of developing diabetes. She is not on medications and notes carbohydrate cravings. She continues to work on diet and exercise to decrease risk of diabetes. She denies nausea or hypoglycemia.  ASSESSMENT AND PLAN:  Vitamin D deficiency  Prediabetes  Class 3 severe obesity with serious comorbidity and body mass index (BMI) of 45.0 to 49.9 in adult,  unspecified obesity type (Nichole Smith)  PLAN:  Vitamin D Deficiency Nichole Smith was informed that low vitamin D levels contributes to fatigue and are associated with obesity, breast, and colon cancer. Jacquilyn agrees to continue taking prescription Vit D @50 ,000 IU every week and will follow up for routine testing of vitamin D, at least 2-3 times per year. She was informed of the risk of over-replacement of vitamin D and agrees to not increase her dose unless she discusses this with [name] first. Nichole Smith agrees to follow up with our clinic in 2 weeks.  Pre-Diabetes Nichole Smith will continue to work on weight loss, exercise, and decreasing simple carbohydrates in her diet to help decrease the risk of diabetes. We dicussed metformin including benefits and risks. She was informed that eating too many simple carbohydrates or too many calories at one sitting increases the likelihood of GI side effects. Nichole Smith declined metformin for now and a prescription was not written today. We will retest labs in mid May. Madelein agrees to follow up with our clinic in 2 weeks as directed to monitor her progress.  Obesity Nichole Smith is currently in the action stage of change. As such, her goal is to continue with weight loss efforts She has agreed to keep a food journal with 1450-1600 calories and 100+ grams of protein daily Nichole Smith has been instructed to work up to a goal of 150 minutes of combined cardio and strengthening exercise per week for weight loss and overall health benefits. We discussed the following Behavioral Modification Strategies today: increasing  lean protein intake, decrease eating out, work on meal planning and easy cooking plans, emotional eating strategies, avoiding temptations, better snacking choices, and keep a strict food journal   Nichole Smith has agreed to follow up with our clinic in 2 weeks. She was informed of the importance of frequent follow up visits to maximize her success with intensive lifestyle modifications for her multiple  health conditions.  ALLERGIES: Allergies  Allergen Reactions  . Neomycin Other (See Comments)    Inflammation--eye drop   . Ace Inhibitors Cough    MEDICATIONS: Current Outpatient Medications on File Prior to Visit  Medication Sig Dispense Refill  . aspirin EC 81 MG tablet Take 81 mg by mouth at bedtime.    [name] atorvastatin (LIPITOR) 40 MG tablet TAKE 1 TABLET DAILY 90 tablet 3  . CALCIUM PO Take 1 tablet by mouth at bedtime.     [name] diltiazem (CARDIZEM CD) 180 MG 24 hr capsule Take 1 capsule (180 mg total) by mouth at bedtime. 90 capsule 3  . estradiol (ESTRACE) 0.1 MG/GM vaginal cream 1 gram pv twice weekly 42.5 g 3  . hydrochlorothiazide (MICROZIDE) 12.5 MG capsule TAKE 1 CAPSULE DAILY.   SCHEDULE ANNUAL VISIT 90 capsule 4  . Multiple Vitamins-Minerals (PRESERVISION AREDS 2 PO) Take 1 tablet by mouth 2 (two) times daily.    . naproxen sodium (ALEVE) 220 MG tablet Take 220 mg by mouth 2 (two) times daily as needed (for pain.).    [name] nitrofurantoin (MACRODANTIN) 50 MG capsule Take 1 capsule (50 mg total) by mouth daily. 90 capsule 1  . SYNTHROID 137 MCG tablet TAKE 1 TABLET DAILY 90 tablet 1  . TOPROL XL 50 MG 24 hr tablet TAKE 1 TABLET DAILY 90 tablet 3  . Vitamin D, Ergocalciferol, (DRISDOL) 1.25 MG (50000 UT) CAPS capsule Take 1 capsule (50,000 Units total) by mouth every 7 (seven) days. 4 capsule 0   No current facility-administered medications on file prior to visit.     PAST MEDICAL HISTORY: Past Medical History:  Diagnosis Date  . Arthritis    knees, back  . Cancer ([place])   . Carcinoid tumor of lung 2012   right found incidentally on chest xray eval for r/o vasxculitis   . Carpal tunnel syndrome, bilateral   . Coronary artery calcification 07/23/2016  . Double vision    history, no current problem  . Dysrhythmia    irregular occassional, no current problem  . Frequent UTI   . HNP (herniated nucleus pulposus), cervical 01/22/2011   C6-C7  Has seen dr Vertell Limber for this and Dr  Tonita Cong  Positional numbness in hands no weakness    . Hypertension   . Hypothyroidism   . Infertility, female   . 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 Obstructive sleep apnea 07/27/2014   NPSG 07/2014:  AHI 85/hr, optimal cpap 10cm.  Download 08/2014:  Good compliance, breakthru apnea on 10cm.  Changed to auto 5-12cm >> good control of AHI on f/u download ONO on CPAP:      . Osteoarthritis   . PCOS (polycystic ovarian syndrome)   . SUI (stress urinary incontinence, female)    h/o    PAST SURGICAL HISTORY: Past Surgical History:  Procedure Laterality Date  . ANTERIOR CERVICAL DECOMP/DISCECTOMY FUSION  12/24/2017   Procedure: Cervical five-six Cervical six-seven Anterior cervical decompression/discectomy/fusion;  Surgeon: Erline Levine, MD;  Location: [facility];  Service: Neurosurgery;;  . CARPAL TUNNEL RELEASE Left 12/24/2017   Procedure: LEFT CARPAL TUNNEL RELEASE;  Surgeon:  Erline Levine, MD;  Location: [facility];  Service: Neurosurgery;  Laterality: Left;  . CESAREAN SECTION     x 1 - twins  . COLONOSCOPY    . DILATATION & CURRETTAGE/HYSTEROSCOPY WITH RESECTOCOPE N/A 07/18/2013   Procedure: DILATATION & CURETTAGE/HYSTEROSCOPY WITH RESECTOCOPE;  Surgeon: Lyman Speller, MD;  Location: [facility];  Service: Gynecology;  Laterality: N/A;  mass resection  . Lung tumor removed     Rt lung carcinoid  . MOUTH SURGERY     pre-cancerous ulcer removed  . SHOULDER OPEN ROTATOR CUFF REPAIR  07/20/2010   Right  . WISDOM TOOTH EXTRACTION      SOCIAL HISTORY: Social History   Tobacco Use  . Smoking status: Never Smoker  . Smokeless tobacco: Never Used  Substance Use Topics  . Alcohol use: Yes    Alcohol/week: 2.0 standard drinks    Types: 2 Glasses of wine per week    Comment: occasional  . Drug use: No    FAMILY HISTORY: Family History  Problem Relation Age of Onset  . Stroke Mother        died age 8  . Hypertension Mother   . Sudden death Mother   . Alcoholism Mother   .  Coronary artery disease Father        died age 58  . Hypertension Father   . Liver disease Father   . Alcohol abuse Father   . Obesity Father   . Parkinson's disease Brother   . Diabetes Brother   . Hypertension Brother   . Hypertension Maternal Uncle   . Colon cancer Neg Hx   . Rectal cancer Neg Hx   . Stomach cancer Neg Hx     ROS: Review of Systems  Constitutional: Positive for malaise/fatigue. Negative for weight loss.  Gastrointestinal: Negative for nausea and vomiting.  Musculoskeletal:       Negative muscle weakness  Endo/Heme/Allergies:       Negative hypoglycemia    PHYSICAL EXAM: Pt in no acute distress  RECENT LABS AND TESTS: BMET    Component Value Date/Time   NA 140 01/12/2019 1040   K 3.9 01/12/2019 1040   CL 98 01/12/2019 1040   CO2 27 01/12/2019 1040   GLUCOSE 108 (H) 01/12/2019 1040   GLUCOSE 100 (H) 06/07/2018 0739   BUN 22 01/12/2019 1040   CREATININE 0.64 01/12/2019 1040   CALCIUM 9.5 01/12/2019 1040   GFRNONAA 95 01/12/2019 1040   GFRAA 109 01/12/2019 1040   Lab Results  Component Value Date   HGBA1C 5.9 (H) 01/12/2019   HGBA1C 5.9 06/07/2018   Lab Results  Component Value Date   INSULIN 20.4 01/12/2019   CBC    Component Value Date/Time   WBC 7.8 01/12/2019 1040   WBC 7.3 06/07/2018 0739   RBC 5.24 01/12/2019 1040   RBC 4.86 06/07/2018 0739   HGB 16.3 (H) 01/12/2019 1040   HGB 15.4 03/13/2014 1422   HCT 47.8 (H) 01/12/2019 1040   HCT 45.3 03/13/2014 1422   PLT 228.0 06/07/2018 0739   PLT 245 03/13/2014 1422   MCV 91 01/12/2019 1040   MCV 89.0 03/13/2014 1422   MCH 31.1 01/12/2019 1040   MCH 31.3 12/21/2017 1049   MCHC 34.1 01/12/2019 1040   MCHC 34.3 06/07/2018 0739   RDW 12.0 01/12/2019 1040   RDW 13.2 03/13/2014 1422   LYMPHSABS 2.3 01/12/2019 1040   LYMPHSABS 3.2 03/13/2014 1422   MONOABS 0.6 06/07/2018 0739   MONOABS 0.7 03/13/2014  1422   EOSABS 0.3 01/12/2019 1040   BASOSABS 0.1 01/12/2019 1040   BASOSABS 0.0  03/13/2014 1422   Iron/TIBC/Ferritin/ %Sat No results found for: IRON, TIBC, FERRITIN, IRONPCTSAT Lipid Panel     Component Value Date/Time   CHOL 133 01/12/2019 1040   CHOL 110 11/03/2016 0000   TRIG 62 01/12/2019 1040   TRIG 54 11/03/2016 0000   HDL 57 01/12/2019 1040   HDL 48 (L) 11/03/2016 0000   CHOLHDL 2 06/07/2018 0739   VLDL 10.8 06/07/2018 0739   LDLCALC 64 01/12/2019 1040   LDLCALC 49 11/03/2016 0000   Hepatic Function Panel     Component Value Date/Time   PROT 7.3 01/12/2019 1040   ALBUMIN 4.2 01/12/2019 1040   AST 24 01/12/2019 1040   ALT 26 01/12/2019 1040   ALKPHOS 91 01/12/2019 1040   BILITOT 0.6 01/12/2019 1040   BILIDIR 0.1 06/07/2018 0739   IBILI 0.4 09/08/2016 0749      Component Value Date/Time   TSH 2.490 01/12/2019 1040   TSH 3.66 06/07/2018 0739   TSH 1.40 04/30/2017 0838      I, Trixie Dredge, am acting as transcriptionist for Ilene Qua, MD  I have reviewed the above documentation for accuracy and completeness, and I agree with the above. - Ilene Qua, MD

## 2019-03-29 ENCOUNTER — Other Ambulatory Visit: Payer: Self-pay

## 2019-03-29 ENCOUNTER — Ambulatory Visit (INDEPENDENT_AMBULATORY_CARE_PROVIDER_SITE_OTHER): Admitting: Family Medicine

## 2019-03-29 ENCOUNTER — Encounter (INDEPENDENT_AMBULATORY_CARE_PROVIDER_SITE_OTHER): Payer: Self-pay | Admitting: Family Medicine

## 2019-03-29 DIAGNOSIS — Z6841 Body Mass Index (BMI) 40.0 and over, adult: Secondary | ICD-10-CM

## 2019-03-29 DIAGNOSIS — E559 Vitamin D deficiency, unspecified: Secondary | ICD-10-CM | POA: Diagnosis not present

## 2019-03-29 DIAGNOSIS — R7303 Prediabetes: Secondary | ICD-10-CM

## 2019-03-29 DIAGNOSIS — E66813 Obesity, class 3: Secondary | ICD-10-CM

## 2019-03-29 MED ORDER — VITAMIN D (ERGOCALCIFEROL) 1.25 MG (50000 UNIT) PO CAPS: 50000.0000 [IU] | ORAL_CAPSULE | ORAL | 0 refills | Status: DC

## 2019-03-30 NOTE — Progress Notes (Signed)
**Note Nichole-Identified via Obfuscation** Office: [phone]  /  Fax: [phone] TeleHealth Visit:  Nichole Smith has verbally consented to this TeleHealth visit today. The patient is located at home, the provider is located at the [employer] [facility] office. The participants in this visit include the listed provider and patient. The visit was conducted today via face time.  HPI:   Chief Complaint: OBESITY Nichole Smith is here to discuss her progress with her obesity treatment plan. She is on the keep a food journal with 1450-1600 calories and 100+ grams of protein daily and is following her eating plan approximately 90 % of the time. She states she is walking and doing exercise videos for 3 hours and 50 minutes per week. Nichole Smith is slightly disappointed with weight loss of 3 lbs in 2 weeks. She is feeling frustrated with lack of usage of her scale. She is breaking afternoon up to have a large snack around 3 pm and then dinner early. She states her blood pressure has been 140's/70's. We were unable to weigh the patient today for this TeleHealth visit. She feels as if she has lost 3 lbs since her last visit. She has lost 2-5 lbs since starting treatment with [name].  Vitamin D Deficiency Nichole Smith has a diagnosis of vitamin D deficiency. She is currently taking prescription Vit D. She notes fatigue and denies nausea, vomiting or muscle weakness.  Pre-Diabetes Nichole Smith has a diagnosis of pre-diabetes based on her elevated Hgb A1c of 5.9 on 01/12/2019. She was informed this puts her at greater risk of developing diabetes. She is not taking metformin currently and continues to work on diet and exercise to decrease risk of diabetes. She denies nausea or hypoglycemia.  ASSESSMENT AND PLAN:  Vitamin D deficiency - Plan: Vitamin D, Ergocalciferol, (DRISDOL) 1.25 MG (50000 UT) CAPS capsule  Prediabetes  Class 3 severe obesity with serious comorbidity and body mass index (BMI) of 45.0 to 49.9 in adult, unspecified obesity type (Nichole Smith)  PLAN:   Vitamin D Deficiency Nichole Smith was informed that low vitamin D levels contributes to fatigue and are associated with obesity, breast, and colon cancer. Nichole Smith agrees to continue taking prescription Vit D @50 ,000 IU every week #12 with no refills. She will follow up for routine testing of vitamin D, at least 2-3 times per year. She was informed of the risk of over-replacement of vitamin D and agrees to not increase her dose unless she discusses this with [name] first. Nichole Smith agrees to follow up with our clinic in 2 weeks.  Pre-Diabetes Nichole Smith will continue to work on weight loss, exercise, and decreasing simple carbohydrates in her diet to help decrease the risk of diabetes. We dicussed metformin including benefits and risks. She was informed that eating too many simple carbohydrates or too many calories at one sitting increases the likelihood of GI side effects. Nichole Smith declined metformin for now and a prescription was not written today. We will repeat labs in mid or late May. Nichole Smith agrees to follow up with our clinic in 2 weeks as directed to monitor her progress.  Obesity Nichole Smith is currently in the action stage of change. As such, her goal is to continue with weight loss efforts She has agreed to keep a food journal with 1450-1600 calories and 100+ grams of protein daily Nichole Smith has been instructed to work up to a goal of 150 minutes of combined cardio and strengthening exercise per week for weight loss and overall health benefits. We discussed the following Behavioral Modification Strategies today: increasing lean  protein intake, increasing vegetables and work on meal planning and easy cooking plans, keeping healthy foods in the home, better snacking choices, and planning for success   Nichole Smith has agreed to follow up with our clinic in 2 weeks. She was informed of the importance of frequent follow up visits to maximize her success with intensive lifestyle modifications for her multiple health conditions.   ALLERGIES: Allergies  Allergen Reactions  . Neomycin Other (See Comments)    Inflammation--eye drop   . Ace Inhibitors Cough    MEDICATIONS: Current Outpatient Medications on File Prior to Visit  Medication Sig Dispense Refill  . aspirin EC 81 MG tablet Take 81 mg by mouth at bedtime.    [name] atorvastatin (LIPITOR) 40 MG tablet TAKE 1 TABLET DAILY 90 tablet 3  . CALCIUM PO Take 1 tablet by mouth at bedtime.     [name] diltiazem (CARDIZEM CD) 180 MG 24 hr capsule Take 1 capsule (180 mg total) by mouth at bedtime. 90 capsule 3  . estradiol (ESTRACE) 0.1 MG/GM vaginal cream 1 gram pv twice weekly 42.5 g 3  . hydrochlorothiazide (MICROZIDE) 12.5 MG capsule TAKE 1 CAPSULE DAILY.   SCHEDULE ANNUAL VISIT 90 capsule 4  . Multiple Vitamins-Minerals (PRESERVISION AREDS 2 PO) Take 1 tablet by mouth 2 (two) times daily.    . naproxen sodium (ALEVE) 220 MG tablet Take 220 mg by mouth 2 (two) times daily as needed (for pain.).    [name] nitrofurantoin (MACRODANTIN) 50 MG capsule Take 1 capsule (50 mg total) by mouth daily. 90 capsule 1  . SYNTHROID 137 MCG tablet TAKE 1 TABLET DAILY 90 tablet 1  . TOPROL XL 50 MG 24 hr tablet TAKE 1 TABLET DAILY 90 tablet 3   No current facility-administered medications on file prior to visit.     PAST MEDICAL HISTORY: Past Medical History:  Diagnosis Date  . Arthritis    knees, back  . Cancer ([place])   . Carcinoid tumor of lung 2012   right found incidentally on chest xray eval for r/o vasxculitis   . Carpal tunnel syndrome, bilateral   . Coronary artery calcification 07/23/2016  . Double vision    history, no current problem  . Dysrhythmia    irregular occassional, no current problem  . Frequent UTI   . HNP (herniated nucleus pulposus), cervical 01/22/2011   C6-C7  Has seen dr Vertell Limber for this and Dr Tonita Cong  Positional numbness in hands no weakness    . Hypertension   . Hypothyroidism   . Infertility, female   . 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  Obstructive sleep apnea 07/27/2014   NPSG 07/2014:  AHI 85/hr, optimal cpap 10cm.  Download 08/2014:  Good compliance, breakthru apnea on 10cm.  Changed to auto 5-12cm >> good control of AHI on f/u download ONO on CPAP:      . Osteoarthritis   . PCOS (polycystic ovarian syndrome)   . SUI (stress urinary incontinence, female)    h/o    PAST SURGICAL HISTORY: Past Surgical History:  Procedure Laterality Date  . ANTERIOR CERVICAL DECOMP/DISCECTOMY FUSION  12/24/2017   Procedure: Cervical five-six Cervical six-seven Anterior cervical decompression/discectomy/fusion;  Surgeon: Erline Levine, MD;  Location: [facility];  Service: Neurosurgery;;  . Wilmon Pali RELEASE Left 12/24/2017   Procedure: LEFT CARPAL TUNNEL RELEASE;  Surgeon: Erline Levine, MD;  Location: [facility];  Service: Neurosurgery;  Laterality: Left;  . CESAREAN SECTION     x 1 - twins  . COLONOSCOPY    .  DILATATION & CURRETTAGE/HYSTEROSCOPY WITH RESECTOCOPE N/A 07/18/2013   Procedure: DILATATION & CURETTAGE/HYSTEROSCOPY WITH RESECTOCOPE;  Surgeon: Lyman Speller, MD;  Location: [facility];  Service: Gynecology;  Laterality: N/A;  mass resection  . Lung tumor removed     Rt lung carcinoid  . MOUTH SURGERY     pre-cancerous ulcer removed  . SHOULDER OPEN ROTATOR CUFF REPAIR  07/20/2010   Right  . WISDOM TOOTH EXTRACTION      SOCIAL HISTORY: Social History   Tobacco Use  . Smoking status: Never Smoker  . Smokeless tobacco: Never Used  Substance Use Topics  . Alcohol use: Yes    Alcohol/week: 2.0 standard drinks    Types: 2 Glasses of wine per week    Comment: occasional  . Drug use: No    FAMILY HISTORY: Family History  Problem Relation Age of Onset  . Stroke Mother        died age 71  . Hypertension Mother   . Sudden death Mother   . Alcoholism Mother   . Coronary artery disease Father        died age 62  . Hypertension Father   . Liver disease Father   . Alcohol abuse Father   . Obesity Father   . Parkinson's  disease Brother   . Diabetes Brother   . Hypertension Brother   . Hypertension Maternal Uncle   . Colon cancer Neg Hx   . Rectal cancer Neg Hx   . Stomach cancer Neg Hx     ROS: Review of Systems  Constitutional: Positive for malaise/fatigue and weight loss.  Gastrointestinal: Negative for nausea and vomiting.  Musculoskeletal:       Negative muscle weakness  Endo/Heme/Allergies:       Negative hypoglycemia    PHYSICAL EXAM: Pt in no acute distress  RECENT LABS AND TESTS: BMET    Component Value Date/Time   NA 140 01/12/2019 1040   K 3.9 01/12/2019 1040   CL 98 01/12/2019 1040   CO2 27 01/12/2019 1040   GLUCOSE 108 (H) 01/12/2019 1040   GLUCOSE 100 (H) 06/07/2018 0739   BUN 22 01/12/2019 1040   CREATININE 0.64 01/12/2019 1040   CALCIUM 9.5 01/12/2019 1040   GFRNONAA 95 01/12/2019 1040   GFRAA 109 01/12/2019 1040   Lab Results  Component Value Date   HGBA1C 5.9 (H) 01/12/2019   HGBA1C 5.9 06/07/2018   Lab Results  Component Value Date   INSULIN 20.4 01/12/2019   CBC    Component Value Date/Time   WBC 7.8 01/12/2019 1040   WBC 7.3 06/07/2018 0739   RBC 5.24 01/12/2019 1040   RBC 4.86 06/07/2018 0739   HGB 16.3 (H) 01/12/2019 1040   HGB 15.4 03/13/2014 1422   HCT 47.8 (H) 01/12/2019 1040   HCT 45.3 03/13/2014 1422   PLT 228.0 06/07/2018 0739   PLT 245 03/13/2014 1422   MCV 91 01/12/2019 1040   MCV 89.0 03/13/2014 1422   MCH 31.1 01/12/2019 1040   MCH 31.3 12/21/2017 1049   MCHC 34.1 01/12/2019 1040   MCHC 34.3 06/07/2018 0739   RDW 12.0 01/12/2019 1040   RDW 13.2 03/13/2014 1422   LYMPHSABS 2.3 01/12/2019 1040   LYMPHSABS 3.2 03/13/2014 1422   MONOABS 0.6 06/07/2018 0739   MONOABS 0.7 03/13/2014 1422   EOSABS 0.3 01/12/2019 1040   BASOSABS 0.1 01/12/2019 1040   BASOSABS 0.0 03/13/2014 1422   Iron/TIBC/Ferritin/ %Sat No results found for: IRON, TIBC, FERRITIN, IRONPCTSAT Lipid Panel  Component Value Date/Time   CHOL 133 01/12/2019 1040    CHOL 110 11/03/2016 0000   TRIG 62 01/12/2019 1040   TRIG 54 11/03/2016 0000   HDL 57 01/12/2019 1040   HDL 48 (L) 11/03/2016 0000   CHOLHDL 2 06/07/2018 0739   VLDL 10.8 06/07/2018 0739   LDLCALC 64 01/12/2019 1040   LDLCALC 49 11/03/2016 0000   Hepatic Function Panel     Component Value Date/Time   PROT 7.3 01/12/2019 1040   ALBUMIN 4.2 01/12/2019 1040   AST 24 01/12/2019 1040   ALT 26 01/12/2019 1040   ALKPHOS 91 01/12/2019 1040   BILITOT 0.6 01/12/2019 1040   BILIDIR 0.1 06/07/2018 0739   IBILI 0.4 09/08/2016 0749      Component Value Date/Time   TSH 2.490 01/12/2019 1040   TSH 3.66 06/07/2018 0739   TSH 1.40 04/30/2017 0838      I, Trixie Dredge, am acting as transcriptionist for Ilene Qua, MD  I have reviewed the above documentation for accuracy and completeness, and I agree with the above. - Ilene Qua, MD

## 2019-04-12 ENCOUNTER — Ambulatory Visit (INDEPENDENT_AMBULATORY_CARE_PROVIDER_SITE_OTHER): Admitting: Family Medicine

## 2019-04-12 ENCOUNTER — Other Ambulatory Visit: Payer: Self-pay

## 2019-04-12 ENCOUNTER — Encounter (INDEPENDENT_AMBULATORY_CARE_PROVIDER_SITE_OTHER): Payer: Self-pay | Admitting: Family Medicine

## 2019-04-12 DIAGNOSIS — E038 Other specified hypothyroidism: Secondary | ICD-10-CM

## 2019-04-12 DIAGNOSIS — R7303 Prediabetes: Secondary | ICD-10-CM | POA: Diagnosis not present

## 2019-04-12 DIAGNOSIS — Z6841 Body Mass Index (BMI) 40.0 and over, adult: Secondary | ICD-10-CM

## 2019-04-13 ENCOUNTER — Other Ambulatory Visit: Payer: Self-pay | Admitting: Obstetrics & Gynecology

## 2019-04-13 NOTE — Progress Notes (Signed)
Office: [phone]  /  Fax: [phone] TeleHealth Visit:  Nichole Smith has verbally consented to this TeleHealth visit today. The patient is located at home, the provider is located at the [employer] [facility] office. The participants in this visit include the listed provider and patient. The visit was conducted today via face time.  HPI:   Chief Complaint: OBESITY Nichole Smith is here to discuss her progress with her obesity treatment plan. She is on the keep a food journal with 1450-1600 calories and 100+ grams of protein daily and is following her eating plan approximately 75 % of the time. She states she is walking and exercising with videos. Nichole Smith voices frustration with struggling at 1500 calories and getting in 100 grams of protein. 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 She is trying hard to get a good amount of protein in at breakfast. She states her blood pressure is 140/70.  We were unable to weigh the patient today for this TeleHealth visit. She feels as if she has lost 1 lb since her last visit. She has lost 2-3 lbs since starting treatment with [name].  Pre-Diabetes Nichole Smith has a diagnosis of pre-diabetes based on her elevated Hgb A1c of 5.9 and insulin of 20.4. She was informed this puts her at greater risk of developing diabetes. She is not taking metformin currently and notes carbohydrate cravings. She continues to work on diet and exercise to decrease risk of diabetes. She denies nausea or hypoglycemia.  Hypothyroidism Nichole Smith has a diagnosis of hypothyroidism. She is currently on Synthroid. Last TSH was within normal limits. She denies hot or cold intolerance or palpitations.  ASSESSMENT AND PLAN:  Prediabetes  Other specified hypothyroidism  Class 3 severe obesity with serious comorbidity and body mass index (BMI) of 45.0 to 49.9 in adult, unspecified obesity type Nichole Smith)  PLAN:  Pre-Diabetes Nichole Smith will continue to work on weight loss,  exercise, and decreasing simple carbohydrates in her diet to help decrease the risk of diabetes. We dicussed metformin including benefits and risks. She was informed that eating too many simple carbohydrates or too many calories at one sitting increases the likelihood of GI side effects. Jaimee declined metformin for now and a prescription was not written today. We will repeat labs at her first in person appointment. Nichole Smith agrees to follow up with our clinic in 2 weeks as directed to monitor her progress.  Hypothyroidism Nichole Smith was informed of the importance of good thyroid control to help with weight loss efforts. She was also informed that supertheraputic thyroid levels are dangerous and will not improve weight loss results. Nichole Smith agrees to continue taking Synthroid, no refill needed, and she agrees to follow up with our clinic in 2 weeks.  Obesity Nichole Smith is currently in the action stage of change. As such, her goal is to continue with weight loss efforts She has agreed to keep a food journal with 1500 calories and 100+ grams of protein daily Nichole Smith has been instructed to work up to a goal of 150 minutes of combined cardio and strengthening exercise per week or continue walking and exercise videos for weight loss and overall health benefits. We discussed the following Behavioral Modification Strategies today: increasing lean protein intake, increasing vegetables and work on meal planning and easy cooking plans, keeping healthy foods in the home, and planning for success   Nichole Smith has agreed to follow up with our clinic in 2 weeks. She was informed of the importance of frequent follow up  visits to maximize her success with intensive lifestyle modifications for her multiple health conditions.  ALLERGIES: Allergies  Allergen Reactions  . Neomycin Other (See Comments)    Inflammation--eye drop   . Ace Inhibitors Cough    MEDICATIONS: Current Outpatient Medications on File Prior to Visit  Medication  Sig Dispense Refill  . aspirin EC 81 MG tablet Take 81 mg by mouth at bedtime.    [name] atorvastatin (LIPITOR) 40 MG tablet TAKE 1 TABLET DAILY 90 tablet 3  . CALCIUM PO Take 1 tablet by mouth at bedtime.     [name] diltiazem (CARDIZEM CD) 180 MG 24 hr capsule Take 1 capsule (180 mg total) by mouth at bedtime. 90 capsule 3  . estradiol (ESTRACE) 0.1 MG/GM vaginal cream 1 gram pv twice weekly 42.5 g 3  . hydrochlorothiazide (MICROZIDE) 12.5 MG capsule TAKE 1 CAPSULE DAILY.   SCHEDULE ANNUAL VISIT 90 capsule 4  . Multiple Vitamins-Minerals (PRESERVISION AREDS 2 PO) Take 1 tablet by mouth 2 (two) times daily.    . naproxen sodium (ALEVE) 220 MG tablet Take 220 mg by mouth 2 (two) times daily as needed (for pain.).    [name] nitrofurantoin (MACRODANTIN) 50 MG capsule Take 1 capsule (50 mg total) by mouth daily. 90 capsule 1  . SYNTHROID 137 MCG tablet TAKE 1 TABLET DAILY 90 tablet 1  . TOPROL XL 50 MG 24 hr tablet TAKE 1 TABLET DAILY 90 tablet 3  . Vitamin D, Ergocalciferol, (DRISDOL) 1.25 MG (50000 UT) CAPS capsule Take 1 capsule (50,000 Units total) by mouth every 7 (seven) days. 12 capsule 0   No current facility-administered medications on file prior to visit.     PAST MEDICAL HISTORY: Past Medical History:  Diagnosis Date  . Arthritis    knees, back  . Cancer ([place])   . Carcinoid tumor of lung 2012   right found incidentally on chest xray eval for r/o vasxculitis   . Carpal tunnel syndrome, bilateral   . Coronary artery calcification 07/23/2016  . Double vision    history, no current problem  . Dysrhythmia    irregular occassional, no current problem  . Frequent UTI   . HNP (herniated nucleus pulposus), cervical 01/22/2011   C6-C7  Has seen dr Vertell Limber for this and Dr Tonita Cong  Positional numbness in hands no weakness    . Hypertension   . Hypothyroidism   . Infertility, female   . 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 Obstructive sleep apnea 07/27/2014   NPSG 07/2014:  AHI 85/hr, optimal  cpap 10cm.  Download 08/2014:  Good compliance, breakthru apnea on 10cm.  Changed to auto 5-12cm >> good control of AHI on f/u download ONO on CPAP:      . Osteoarthritis   . PCOS (polycystic ovarian syndrome)   . SUI (stress urinary incontinence, female)    h/o    PAST SURGICAL HISTORY: Past Surgical History:  Procedure Laterality Date  . ANTERIOR CERVICAL DECOMP/DISCECTOMY FUSION  12/24/2017   Procedure: Cervical five-six Cervical six-seven Anterior cervical decompression/discectomy/fusion;  Surgeon: Erline Levine, MD;  Location: [facility];  Service: Neurosurgery;;  . Wilmon Pali RELEASE Left 12/24/2017   Procedure: LEFT CARPAL TUNNEL RELEASE;  Surgeon: Erline Levine, MD;  Location: [facility];  Service: Neurosurgery;  Laterality: Left;  . CESAREAN SECTION     x 1 - twins  . COLONOSCOPY    . DILATATION & CURRETTAGE/HYSTEROSCOPY WITH RESECTOCOPE N/A 07/18/2013   Procedure: DILATATION & CURETTAGE/HYSTEROSCOPY WITH RESECTOCOPE;  Surgeon: Lyman Speller,  MD;  Location: [facility];  Service: Gynecology;  Laterality: N/A;  mass resection  . Lung tumor removed     Rt lung carcinoid  . MOUTH SURGERY     pre-cancerous ulcer removed  . SHOULDER OPEN ROTATOR CUFF REPAIR  07/20/2010   Right  . WISDOM TOOTH EXTRACTION      SOCIAL HISTORY: Social History   Tobacco Use  . Smoking status: Never Smoker  . Smokeless tobacco: Never Used  Substance Use Topics  . Alcohol use: Yes    Alcohol/week: 2.0 standard drinks    Types: 2 Glasses of wine per week    Comment: occasional  . Drug use: No    FAMILY HISTORY: Family History  Problem Relation Age of Onset  . Stroke Mother        died age 73  . Hypertension Mother   . Sudden death Mother   . Alcoholism Mother   . Coronary artery disease Father        died age 70  . Hypertension Father   . Liver disease Father   . Alcohol abuse Father   . Obesity Father   . Parkinson's disease Brother   . Diabetes Brother   . Hypertension Brother   .  Hypertension Maternal Uncle   . Colon cancer Neg Hx   . Rectal cancer Neg Hx   . Stomach cancer Neg Hx     ROS: Review of Systems  Constitutional: Positive for weight loss.  Cardiovascular: Negative for palpitations.  Gastrointestinal: Negative for nausea.  Endo/Heme/Allergies:       Negative hypoglycemia Negative hot/cold intolerance    PHYSICAL EXAM: Pt in no acute distress  RECENT LABS AND TESTS: BMET    Component Value Date/Time   NA 140 01/12/2019 1040   K 3.9 01/12/2019 1040   CL 98 01/12/2019 1040   CO2 27 01/12/2019 1040   GLUCOSE 108 (H) 01/12/2019 1040   GLUCOSE 100 (H) 06/07/2018 0739   BUN 22 01/12/2019 1040   CREATININE 0.64 01/12/2019 1040   CALCIUM 9.5 01/12/2019 1040   GFRNONAA 95 01/12/2019 1040   GFRAA 109 01/12/2019 1040   Lab Results  Component Value Date   HGBA1C 5.9 (H) 01/12/2019   HGBA1C 5.9 06/07/2018   Lab Results  Component Value Date   INSULIN 20.4 01/12/2019   CBC    Component Value Date/Time   WBC 7.8 01/12/2019 1040   WBC 7.3 06/07/2018 0739   RBC 5.24 01/12/2019 1040   RBC 4.86 06/07/2018 0739   HGB 16.3 (H) 01/12/2019 1040   HGB 15.4 03/13/2014 1422   HCT 47.8 (H) 01/12/2019 1040   HCT 45.3 03/13/2014 1422   PLT 228.0 06/07/2018 0739   PLT 245 03/13/2014 1422   MCV 91 01/12/2019 1040   MCV 89.0 03/13/2014 1422   MCH 31.1 01/12/2019 1040   MCH 31.3 12/21/2017 1049   MCHC 34.1 01/12/2019 1040   MCHC 34.3 06/07/2018 0739   RDW 12.0 01/12/2019 1040   RDW 13.2 03/13/2014 1422   LYMPHSABS 2.3 01/12/2019 1040   LYMPHSABS 3.2 03/13/2014 1422   MONOABS 0.6 06/07/2018 0739   MONOABS 0.7 03/13/2014 1422   EOSABS 0.3 01/12/2019 1040   BASOSABS 0.1 01/12/2019 1040   BASOSABS 0.0 03/13/2014 1422   Iron/TIBC/Ferritin/ %Sat No results found for: IRON, TIBC, FERRITIN, IRONPCTSAT Lipid Panel     Component Value Date/Time   CHOL 133 01/12/2019 1040   CHOL 110 11/03/2016 0000   TRIG 62 01/12/2019 1040  TRIG 54 11/03/2016  0000   HDL 57 01/12/2019 1040   HDL 48 (L) 11/03/2016 0000   CHOLHDL 2 06/07/2018 0739   VLDL 10.8 06/07/2018 0739   LDLCALC 64 01/12/2019 1040   LDLCALC 49 11/03/2016 0000   Hepatic Function Panel     Component Value Date/Time   PROT 7.3 01/12/2019 1040   ALBUMIN 4.2 01/12/2019 1040   AST 24 01/12/2019 1040   ALT 26 01/12/2019 1040   ALKPHOS 91 01/12/2019 1040   BILITOT 0.6 01/12/2019 1040   BILIDIR 0.1 06/07/2018 0739   IBILI 0.4 09/08/2016 0749      Component Value Date/Time   TSH 2.490 01/12/2019 1040   TSH 3.66 06/07/2018 0739   TSH 1.40 04/30/2017 0838      I, Trixie Dredge, am acting as transcriptionist for Ilene Qua, MD  I have reviewed the above documentation for accuracy and completeness, and I agree with the above. - Ilene Qua, MD

## 2019-04-13 NOTE — Telephone Encounter (Signed)
Medication refill request: estrace vaginal cream Last AEX:  03-12-18 Next AEX: 05-27-2019 Last MMG (if hormonal medication request): 08-12-18 category a density birads 1:neg Refill authorized: please approve if appropriate.

## 2019-04-27 ENCOUNTER — Other Ambulatory Visit: Payer: Self-pay

## 2019-04-27 ENCOUNTER — Encounter (INDEPENDENT_AMBULATORY_CARE_PROVIDER_SITE_OTHER): Payer: Self-pay | Admitting: Family Medicine

## 2019-04-27 ENCOUNTER — Ambulatory Visit (INDEPENDENT_AMBULATORY_CARE_PROVIDER_SITE_OTHER): Admitting: Family Medicine

## 2019-04-27 DIAGNOSIS — E559 Vitamin D deficiency, unspecified: Secondary | ICD-10-CM | POA: Diagnosis not present

## 2019-04-27 DIAGNOSIS — Z6841 Body Mass Index (BMI) 40.0 and over, adult: Secondary | ICD-10-CM | POA: Diagnosis not present

## 2019-04-27 DIAGNOSIS — R7303 Prediabetes: Secondary | ICD-10-CM | POA: Diagnosis not present

## 2019-04-27 NOTE — Progress Notes (Signed)
Office: [phone]  /  Fax: [phone] TeleHealth Visit:  Nichole Smith has verbally consented to this TeleHealth visit today. The patient is located at home, the provider is located at the [facility] office. The participants in this visit include the listed provider and patient. The visit was conducted today via face time.  HPI:   Chief Complaint: OBESITY Nichole Smith is here to discuss her progress with her obesity treatment plan. She is on the keep a food journal with 1500 calories and 100+ grams of protein daily and is following her eating plan approximately 75 % of the time. She states she is walking and doing strength training for 250 minutes 1 time per week. [name] voices that last week her average calorie intake was 1588. Her weight is of 225 lbs today. She is very frustrated with lack of weight loss. She noticed she is hungrier at night, so she is budgeting calories for that. She feels she is being fairly accurate with weight loss. She states her blood pressure ranges between 120's and 150's over 60's and 70's. We were unable to weigh the patient today for this TeleHealth visit. She feels as if she has maintained her weight since her last visit. She has lost 2-3 lbs since starting treatment with [name].  Vitamin D Deficiency Nichole Smith has a diagnosis of vitamin D deficiency. She is currently taking prescription Vit D. She is noticing some improvement in energy. She denies nausea, vomiting or muscle weakness.  Pre-Diabetes Nichole Smith has a diagnosis of pre-diabetes based on her elevated Hgb A1c and was informed this puts her at greater risk of developing diabetes. She notes sugar cravings occasionally and she is not taking metformin currently. She continues to work on diet and exercise to decrease risk of diabetes. She denies nausea or hypoglycemia.  ASSESSMENT AND PLAN:  Vitamin D deficiency  Prediabetes  Class 3 severe obesity with serious comorbidity and body mass index (BMI)  of 45.0 to 49.9 in adult, unspecified obesity type ([place])  PLAN:  Vitamin D Deficiency Nichole Smith was informed that low vitamin D levels contributes to fatigue and are associated with obesity, breast, and colon cancer. Nichole Smith agrees to continue taking prescription Vit D @50 ,000 IU every week, no refill needed. She will follow up for routine testing of vitamin D, at least 2-3 times per year. She was informed of the risk of over-replacement of vitamin D and agrees to not increase her dose unless she discusses this with [name] first. Nichole Smith agrees to follow up with our clinic in 2 week with Dr. Leafy Ro.  Pre-Diabetes Nichole Smith will continue to work on weight loss, exercise, and decreasing simple carbohydrates in her diet to help decrease the risk of diabetes. We dicussed metformin including benefits and risks. She was informed that eating too many simple carbohydrates or too many calories at one sitting increases the likelihood of GI side effects. Nichole Smith declined metformin for now and a prescription was not written today. We will repeat labs at her first in person appointment. [name] agrees to follow up with our clinic in 2 weeks with Dr. Leafy Ro as directed to monitor her progress.  Obesity Nichole Smith is currently in the action stage of change. As such, her goal is to continue with weight loss efforts She has agreed to keep a food journal with 1500-1600 calories and 90+ grams of protein daily Nichole Smith has been instructed to work up to a goal of 150 minutes of combined cardio and strengthening exercise per week for weight  loss and overall health benefits. We discussed the following Behavioral Modification Strategies today: increasing lean protein intake, increasing vegetables and work on meal planning and easy cooking plans, keeping healthy foods in the home, and planning for success   Nichole Smith has agreed to follow up with our clinic in 2 weeks with Dr. Leafy Ro. She was informed of the importance of frequent follow up visits to  maximize her success with intensive lifestyle modifications for her multiple health conditions.  ALLERGIES: Allergies  Allergen Reactions  . Neomycin Other (See Comments)    Inflammation--eye drop   . Ace Inhibitors Cough    MEDICATIONS: Current Outpatient Medications on File Prior to Visit  Medication Sig Dispense Refill  . aspirin EC 81 MG tablet Take 81 mg by mouth at bedtime.    [name] atorvastatin (LIPITOR) 40 MG tablet TAKE 1 TABLET DAILY 90 tablet 3  . CALCIUM PO Take 1 tablet by mouth at bedtime.     [name] diltiazem (CARDIZEM CD) 180 MG 24 hr capsule Take 1 capsule (180 mg total) by mouth at bedtime. 90 capsule 3  . estradiol (ESTRACE VAGINAL) 0.1 MG/GM vaginal cream INSERT 1 GM VAGINALLY TWICE WEEKLY 42.5 g 0  . hydrochlorothiazide (MICROZIDE) 12.5 MG capsule TAKE 1 CAPSULE DAILY.   SCHEDULE ANNUAL VISIT 90 capsule 4  . Multiple Vitamins-Minerals (PRESERVISION AREDS 2 PO) Take 1 tablet by mouth 2 (two) times daily.    . naproxen sodium (ALEVE) 220 MG tablet Take 220 mg by mouth 2 (two) times daily as needed (for pain.).    [name] nitrofurantoin (MACRODANTIN) 50 MG capsule Take 1 capsule (50 mg total) by mouth daily. 90 capsule 1  . SYNTHROID 137 MCG tablet TAKE 1 TABLET DAILY 90 tablet 1  . TOPROL XL 50 MG 24 hr tablet TAKE 1 TABLET DAILY 90 tablet 3  . Vitamin D, Ergocalciferol, (DRISDOL) 1.25 MG (50000 UT) CAPS capsule Take 1 capsule (50,000 Units total) by mouth every 7 (seven) days. 12 capsule 0   No current facility-administered medications on file prior to visit.     PAST MEDICAL HISTORY: Past Medical History:  Diagnosis Date  . Arthritis    knees, back  . Cancer ([place])   . Carcinoid tumor of lung 2012   right found incidentally on chest xray eval for r/o vasxculitis   . Carpal tunnel syndrome, bilateral   . Coronary artery calcification 07/23/2016  . Double vision    history, no current problem  . Dysrhythmia    irregular occassional, no current problem  . Frequent UTI    . HNP (herniated nucleus pulposus), cervical 01/22/2011   C6-C7  Has seen dr Vertell Limber for this and Dr Tonita Cong  Positional numbness in hands no weakness    . Hypertension   . Hypothyroidism   . Infertility, female   . 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 Obstructive sleep apnea 07/27/2014   NPSG 07/2014:  AHI 85/hr, optimal cpap 10cm.  Download 08/2014:  Good compliance, breakthru apnea on 10cm.  Changed to auto 5-12cm >> good control of AHI on f/u download ONO on CPAP:      . Osteoarthritis   . PCOS (polycystic ovarian syndrome)   . SUI (stress urinary incontinence, female)    h/o    PAST SURGICAL HISTORY: Past Surgical History:  Procedure Laterality Date  . ANTERIOR CERVICAL DECOMP/DISCECTOMY FUSION  12/24/2017   Procedure: Cervical five-six Cervical six-seven Anterior cervical decompression/discectomy/fusion;  Surgeon: Erline Levine, MD;  Location: [facility];  Service: Neurosurgery;;  .  CARPAL TUNNEL RELEASE Left 12/24/2017   Procedure: LEFT CARPAL TUNNEL RELEASE;  Surgeon: Erline Levine, MD;  Location: [facility];  Service: Neurosurgery;  Laterality: Left;  . CESAREAN SECTION     x 1 - twins  . COLONOSCOPY    . DILATATION & CURRETTAGE/HYSTEROSCOPY WITH RESECTOCOPE N/A 07/18/2013   Procedure: DILATATION & CURETTAGE/HYSTEROSCOPY WITH RESECTOCOPE;  Surgeon: Lyman Speller, MD;  Location: [facility];  Service: Gynecology;  Laterality: N/A;  mass resection  . Lung tumor removed     Rt lung carcinoid  . MOUTH SURGERY     pre-cancerous ulcer removed  . SHOULDER OPEN ROTATOR CUFF REPAIR  07/20/2010   Right  . WISDOM TOOTH EXTRACTION      SOCIAL HISTORY: Social History   Tobacco Use  . Smoking status: Never Smoker  . Smokeless tobacco: Never Used  Substance Use Topics  . Alcohol use: Yes    Alcohol/week: 2.0 standard drinks    Types: 2 Glasses of wine per week    Comment: occasional  . Drug use: No    FAMILY HISTORY: Family History  Problem Relation Age of Onset  . Stroke Mother         died age 66  . Hypertension Mother   . Sudden death Mother   . Alcoholism Mother   . Coronary artery disease Father        died age 65  . Hypertension Father   . Liver disease Father   . Alcohol abuse Father   . Obesity Father   . Parkinson's disease Brother   . Diabetes Brother   . Hypertension Brother   . Hypertension Maternal Uncle   . Colon cancer Neg Hx   . Rectal cancer Neg Hx   . Stomach cancer Neg Hx     ROS: Review of Systems  Constitutional: Negative for weight loss.  Gastrointestinal: Negative for nausea and vomiting.  Musculoskeletal:       Negative muscle weakness  Endo/Heme/Allergies:       Negative hypoglycemia    PHYSICAL EXAM: Pt in no acute distress  RECENT LABS AND TESTS: BMET    Component Value Date/Time   NA 140 01/12/2019 1040   K 3.9 01/12/2019 1040   CL 98 01/12/2019 1040   CO2 27 01/12/2019 1040   GLUCOSE 108 (H) 01/12/2019 1040   GLUCOSE 100 (H) 06/07/2018 0739   BUN 22 01/12/2019 1040   CREATININE 0.64 01/12/2019 1040   CALCIUM 9.5 01/12/2019 1040   GFRNONAA 95 01/12/2019 1040   GFRAA 109 01/12/2019 1040   Lab Results  Component Value Date   HGBA1C 5.9 (H) 01/12/2019   HGBA1C 5.9 06/07/2018   Lab Results  Component Value Date   INSULIN 20.4 01/12/2019   CBC    Component Value Date/Time   WBC 7.8 01/12/2019 1040   WBC 7.3 06/07/2018 0739   RBC 5.24 01/12/2019 1040   RBC 4.86 06/07/2018 0739   HGB 16.3 (H) 01/12/2019 1040   HGB 15.4 03/13/2014 1422   HCT 47.8 (H) 01/12/2019 1040   HCT 45.3 03/13/2014 1422   PLT 228.0 06/07/2018 0739   PLT 245 03/13/2014 1422   MCV 91 01/12/2019 1040   MCV 89.0 03/13/2014 1422   MCH 31.1 01/12/2019 1040   MCH 31.3 12/21/2017 1049   MCHC 34.1 01/12/2019 1040   MCHC 34.3 06/07/2018 0739   RDW 12.0 01/12/2019 1040   RDW 13.2 03/13/2014 1422   LYMPHSABS 2.3 01/12/2019 1040   LYMPHSABS 3.2 03/13/2014 1422  MONOABS 0.6 06/07/2018 0739   MONOABS 0.7 03/13/2014 1422   EOSABS  0.3 01/12/2019 1040   BASOSABS 0.1 01/12/2019 1040   BASOSABS 0.0 03/13/2014 1422   Iron/TIBC/Ferritin/ %Sat No results found for: IRON, TIBC, FERRITIN, IRONPCTSAT Lipid Panel     Component Value Date/Time   CHOL 133 01/12/2019 1040   CHOL 110 11/03/2016 0000   TRIG 62 01/12/2019 1040   TRIG 54 11/03/2016 0000   HDL 57 01/12/2019 1040   HDL 48 (L) 11/03/2016 0000   CHOLHDL 2 06/07/2018 0739   VLDL 10.8 06/07/2018 0739   LDLCALC 64 01/12/2019 1040   LDLCALC 49 11/03/2016 0000   Hepatic Function Panel     Component Value Date/Time   PROT 7.3 01/12/2019 1040   ALBUMIN 4.2 01/12/2019 1040   AST 24 01/12/2019 1040   ALT 26 01/12/2019 1040   ALKPHOS 91 01/12/2019 1040   BILITOT 0.6 01/12/2019 1040   BILIDIR 0.1 06/07/2018 0739   IBILI 0.4 09/08/2016 0749      Component Value Date/Time   TSH 2.490 01/12/2019 1040   TSH 3.66 06/07/2018 0739   TSH 1.40 04/30/2017 0838      I, Trixie Dredge, am acting as transcriptionist for Ilene Qua, MD  I have reviewed the above documentation for accuracy and completeness, and I agree with the above. - Ilene Qua, MD

## 2019-05-09 ENCOUNTER — Ambulatory Visit (INDEPENDENT_AMBULATORY_CARE_PROVIDER_SITE_OTHER): Admitting: Family Medicine

## 2019-05-11 ENCOUNTER — Ambulatory Visit (INDEPENDENT_AMBULATORY_CARE_PROVIDER_SITE_OTHER): Admitting: Family Medicine

## 2019-05-16 ENCOUNTER — Other Ambulatory Visit: Payer: Self-pay

## 2019-05-16 ENCOUNTER — Ambulatory Visit (INDEPENDENT_AMBULATORY_CARE_PROVIDER_SITE_OTHER): Admitting: Family Medicine

## 2019-05-16 ENCOUNTER — Encounter (INDEPENDENT_AMBULATORY_CARE_PROVIDER_SITE_OTHER): Payer: Self-pay | Admitting: Family Medicine

## 2019-05-16 VITALS — BP 136/79 | HR 72 | Temp 97.7°F | Ht 60.0 in | Wt 223.0 lb

## 2019-05-16 DIAGNOSIS — Z9189 Other specified personal risk factors, not elsewhere classified: Secondary | ICD-10-CM | POA: Diagnosis not present

## 2019-05-16 DIAGNOSIS — R7303 Prediabetes: Secondary | ICD-10-CM

## 2019-05-16 DIAGNOSIS — I10 Essential (primary) hypertension: Secondary | ICD-10-CM | POA: Diagnosis not present

## 2019-05-16 DIAGNOSIS — Z6841 Body Mass Index (BMI) 40.0 and over, adult: Secondary | ICD-10-CM

## 2019-05-16 MED ORDER — METFORMIN HCL 500 MG PO TABS: 500.0000 mg | ORAL_TABLET | Freq: Every day | ORAL | 0 refills | Status: DC

## 2019-05-17 NOTE — Progress Notes (Signed)
Office: [phone]  /  Fax: [phone]   HPI:   Chief Complaint: OBESITY Nichole Smith is here to discuss her progress with her obesity treatment plan. She is keeping a food journal with 1500 to 1600 calories and 90+ grams of protein and is following her eating plan approximately 100 % of the time. She states she is walking, using weights, and in the pool 50 minutes 6 times per week. Nichole Smith's last visit in our office was 3 months ago. She has lost another 13 pounds since COVID19 isolation. She is struggling with hunger when she keeps her calories at 1500, but is doing well meeting her protein goal.  Her weight is 223 lb (101.2 kg) today and has had a weight loss of 13 pounds over a period of 3 months since her last visit. She has lost 15 lbs since starting treatment with [name].  Pre-Diabetes Nichole Smith has a diagnosis of pre-diabetes based on her elevated Hgb A1c and was informed this puts her at greater risk of developing diabetes. Nichole Smith has been doing well with diet and exercise, but is still struggling with polyphagia, even when she eats adequate calories and protein. She is not taking metformin currently and continues to work on diet and exercise to decrease risk of diabetes. She denies nausea, vomiting, or hypoglycemia.   At risk for diabetes Nichole Smith is at higher than average risk for developing diabetes due to her pre-diabetes and obesity.   Hypertension Nichole Smith is a 63 y.o. female with hypertension. Nichole Smith's blood pressure is improved with diet, exercise, and weight loss. She is working on weight loss to help control her blood pressure with the goal of decreasing her risk of heart attack and stroke. Nichole Smith denies chest pain or headache.  ASSESSMENT AND PLAN:  Prediabetes - Plan: metFORMIN (GLUCOPHAGE) 500 MG tablet  Essential hypertension  At risk for diabetes mellitus  Class 3 severe obesity with serious comorbidity and body mass index (BMI) of 40.0 to 44.9 in adult, unspecified  obesity type Nichole Smith)  PLAN:  Pre-Diabetes Nichole Smith will continue to work on weight loss, exercise, and decreasing simple carbohydrates in her diet to help decrease the risk of diabetes. She was informed that eating too many simple carbohydrates or too many calories at one sitting increases the likelihood of GI side effects. Nichole Smith agreed to start metformin 500 mg qAM #30 with no refills and a prescription was written today. Nichole Smith agreed to follow up with [name] as directed to monitor her progress in 2 weeks.  Diabetes risk counseling Nichole Smith was given extended (15 minutes) diabetes prevention counseling today. She is 63 y.o. female and has risk factors for diabetes including pre-diabetes and obesity. We discussed intensive lifestyle modifications today with an emphasis on weight loss as well as increasing exercise and decreasing simple carbohydrates in her diet.  Hypertension We discussed sodium restriction, working on healthy weight loss, and a regular exercise program as the means to achieve improved blood pressure control. We will continue to monitor her blood pressure as well as her progress with the above lifestyle modifications. She will continue her medications as prescribed, diet, and exercise. She will watch for signs of hypotension as she continues her lifestyle modifications. We will follow her progress. Nichole Smith agreed with this plan and agreed to follow up as directed.  Obesity Nichole Smith is currently in the action stage of change. As such, her goal is to continue with weight loss efforts. She has agreed to keep a food journal with 1500 to 1600  calories and 90+ grams of protein.  Nichole Smith has been instructed to work up to a goal of 150 minutes of combined cardio and strengthening exercise per week for weight loss and overall health benefits. We discussed the following Behavioral Modification Strategies today: increasing lean protein intake, increase H2O intake, and decreasing simple carbohydrates.   Nichole Smith  has agreed to follow up with our clinic in 2 weeks. She was informed of the importance of frequent follow up visits to maximize her success with intensive lifestyle modifications for her multiple health conditions.  ALLERGIES: Allergies  Allergen Reactions  . Neomycin Other (See Comments)    Inflammation--eye drop   . Ace Inhibitors Cough    MEDICATIONS: Current Outpatient Medications on File Prior to Visit  Medication Sig Dispense Refill  . aspirin EC 81 MG tablet Take 81 mg by mouth at bedtime.    [name] atorvastatin (LIPITOR) 40 MG tablet TAKE 1 TABLET DAILY 90 tablet 3  . CALCIUM PO Take 1 tablet by mouth at bedtime.     [name] diltiazem (CARDIZEM CD) 180 MG 24 hr capsule Take 1 capsule (180 mg total) by mouth at bedtime. 90 capsule 3  . estradiol (ESTRACE VAGINAL) 0.1 MG/GM vaginal cream INSERT 1 GM VAGINALLY TWICE WEEKLY 42.5 g 0  . hydrochlorothiazide (MICROZIDE) 12.5 MG capsule TAKE 1 CAPSULE DAILY.   SCHEDULE ANNUAL VISIT 90 capsule 4  . Multiple Vitamins-Minerals (PRESERVISION AREDS 2 PO) Take 1 tablet by mouth 2 (two) times daily.    . naproxen sodium (ALEVE) 220 MG tablet Take 220 mg by mouth 2 (two) times daily as needed (for pain.).    [name] nitrofurantoin (MACRODANTIN) 50 MG capsule Take 1 capsule (50 mg total) by mouth daily. 90 capsule 1  . SYNTHROID 137 MCG tablet TAKE 1 TABLET DAILY 90 tablet 1  . TOPROL XL 50 MG 24 hr tablet TAKE 1 TABLET DAILY 90 tablet 3  . Vitamin D, Ergocalciferol, (DRISDOL) 1.25 MG (50000 UT) CAPS capsule Take 1 capsule (50,000 Units total) by mouth every 7 (seven) days. 12 capsule 0   No current facility-administered medications on file prior to visit.     PAST MEDICAL HISTORY: Past Medical History:  Diagnosis Date  . Arthritis    knees, back  . Cancer ([place])   . Carcinoid tumor of lung 2012   right found incidentally on chest xray eval for r/o vasxculitis   . Carpal tunnel syndrome, bilateral   . Coronary artery calcification 07/23/2016  . Double  vision    history, no current problem  . Dysrhythmia    irregular occassional, no current problem  . Frequent UTI   . HNP (herniated nucleus pulposus), cervical 01/22/2011   C6-C7  Has seen dr Vertell Limber for this and Dr Tonita Cong  Positional numbness in hands no weakness    . Hypertension   . Hypothyroidism   . Infertility, female   . 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 Obstructive sleep apnea 07/27/2014   NPSG 07/2014:  AHI 85/hr, optimal cpap 10cm.  Download 08/2014:  Good compliance, breakthru apnea on 10cm.  Changed to auto 5-12cm >> good control of AHI on f/u download ONO on CPAP:      . Osteoarthritis   . PCOS (polycystic ovarian syndrome)   . SUI (stress urinary incontinence, female)    h/o    PAST SURGICAL HISTORY: Past Surgical History:  Procedure Laterality Date  . ANTERIOR CERVICAL DECOMP/DISCECTOMY FUSION  12/24/2017   Procedure: Cervical five-six Cervical six-seven Anterior cervical decompression/discectomy/fusion;  Surgeon: Erline Levine, MD;  Location: [place] [facility];  Service: Neurosurgery;;  . Wilmon [place] RELEASE Left 12/24/2017   Procedure: LEFT CARPAL TUNNEL RELEASE;  Surgeon: Erline Levine, MD;  Location: [facility];  Service: Neurosurgery;  Laterality: Left;  . CESAREAN SECTION     x 1 - twins  . COLONOSCOPY    . DILATATION & CURRETTAGE/HYSTEROSCOPY WITH RESECTOCOPE N/A 07/18/2013   Procedure: DILATATION & CURETTAGE/HYSTEROSCOPY WITH RESECTOCOPE;  Surgeon: Lyman Speller, MD;  Location: [facility];  Service: Gynecology;  Laterality: N/A;  mass resection  . Lung tumor removed     Rt lung carcinoid  . MOUTH SURGERY     pre-cancerous ulcer removed  . SHOULDER OPEN ROTATOR CUFF REPAIR  07/20/2010   Right  . WISDOM TOOTH EXTRACTION      SOCIAL HISTORY: Social History   Tobacco Use  . Smoking status: Never Smoker  . Smokeless tobacco: Never Used  Substance Use Topics  . Alcohol use: Yes    Alcohol/week: 2.0 standard drinks    Types: 2 Glasses of wine per week     Comment: occasional  . Drug use: No    FAMILY HISTORY: Family History  Problem Relation Age of Onset  . Stroke Mother        died age 79  . Hypertension Mother   . Sudden death Mother   . Alcoholism Mother   . Coronary artery disease Father        died age 80  . Hypertension Father   . Liver disease Father   . Alcohol abuse Father   . Obesity Father   . Parkinson's disease Brother   . Diabetes Brother   . Hypertension Brother   . Hypertension Maternal Uncle   . Colon cancer Neg Hx   . Rectal cancer Neg Hx   . Stomach cancer Neg Hx     ROS: Review of Systems  Endo/Heme/Allergies:       Positive for polyphagia.    PHYSICAL EXAM: Blood pressure 136/79, pulse 72, temperature 97.7 F (36.5 C), temperature source Oral, height 5' (1.524 m), weight 223 lb (101.2 kg), last menstrual period 02/12/2011, SpO2 98 %. Body mass index is 43.55 kg/m. Physical Exam  RECENT LABS AND TESTS: BMET    Component Value Date/Time   NA 140 01/12/2019 1040   K 3.9 01/12/2019 1040   CL 98 01/12/2019 1040   CO2 27 01/12/2019 1040   GLUCOSE 108 (H) 01/12/2019 1040   GLUCOSE 100 (H) 06/07/2018 0739   BUN 22 01/12/2019 1040   CREATININE 0.64 01/12/2019 1040   CALCIUM 9.5 01/12/2019 1040   GFRNONAA 95 01/12/2019 1040   GFRAA 109 01/12/2019 1040   Lab Results  Component Value Date   HGBA1C 5.9 (H) 01/12/2019   HGBA1C 5.9 06/07/2018   Lab Results  Component Value Date   INSULIN 20.4 01/12/2019   CBC    Component Value Date/Time   WBC 7.8 01/12/2019 1040   WBC 7.3 06/07/2018 0739   RBC 5.24 01/12/2019 1040   RBC 4.86 06/07/2018 0739   HGB 16.3 (H) 01/12/2019 1040   HGB 15.4 03/13/2014 1422   HCT 47.8 (H) 01/12/2019 1040   HCT 45.3 03/13/2014 1422   PLT 228.0 06/07/2018 0739   PLT 245 03/13/2014 1422   MCV 91 01/12/2019 1040   MCV 89.0 03/13/2014 1422   MCH 31.1 01/12/2019 1040   MCH 31.3 12/21/2017 1049   MCHC 34.1 01/12/2019 1040   MCHC 34.3 06/07/2018 0739  RDW 12.0  01/12/2019 1040   RDW 13.2 03/13/2014 1422   LYMPHSABS 2.3 01/12/2019 1040   LYMPHSABS 3.2 03/13/2014 1422   MONOABS 0.6 06/07/2018 0739   MONOABS 0.7 03/13/2014 1422   EOSABS 0.3 01/12/2019 1040   BASOSABS 0.1 01/12/2019 1040   BASOSABS 0.0 03/13/2014 1422   Iron/TIBC/Ferritin/ %Sat No results found for: IRON, TIBC, FERRITIN, IRONPCTSAT Lipid Panel     Component Value Date/Time   CHOL 133 01/12/2019 1040   CHOL 110 11/03/2016 0000   TRIG 62 01/12/2019 1040   TRIG 54 11/03/2016 0000   HDL 57 01/12/2019 1040   HDL 48 (L) 11/03/2016 0000   CHOLHDL 2 06/07/2018 0739   VLDL 10.8 06/07/2018 0739   LDLCALC 64 01/12/2019 1040   LDLCALC 49 11/03/2016 0000   Hepatic Function Panel     Component Value Date/Time   PROT 7.3 01/12/2019 1040   ALBUMIN 4.2 01/12/2019 1040   AST 24 01/12/2019 1040   ALT 26 01/12/2019 1040   ALKPHOS 91 01/12/2019 1040   BILITOT 0.6 01/12/2019 1040   BILIDIR 0.1 06/07/2018 0739   IBILI 0.4 09/08/2016 0749      Component Value Date/Time   TSH 2.490 01/12/2019 1040   TSH 3.66 06/07/2018 0739   TSH 1.40 04/30/2017 0838   Results for [name], BOSSMAN (MRN [id number]) as of 05/17/2019 15:15  Ref. Range 01/12/2019 10:40  Vitamin D, 25-Hydroxy Latest Ref Range: 30.0 - 100.0 ng/mL 33.1     OBESITY BEHAVIORAL INTERVENTION VISIT  Today's visit was # 8   Starting weight: 238 lbs Starting date: 01/12/19 Today's weight : Weight: 223 lb (101.2 kg)  Today's date: 05/16/2019 Total lbs lost to date: 15    05/16/2019  Height 5' (1.524 m)  Weight 223 lb (101.2 kg)  BMI (Calculated) 43.55  BLOOD PRESSURE - SYSTOLIC 408  BLOOD PRESSURE - DIASTOLIC 79   Body Fat % 14.4 %  Total Body Water (lbs) 80.8 lbs   ASK: We discussed the diagnosis of obesity with Nichole Smith today and Nichole Smith agreed to give [name] permission to discuss obesity behavioral modification therapy today.  ASSESS: Nichole Smith has the diagnosis of obesity and her BMI today is 43.55. Nichole Smith is in  the action stage of change.   ADVISE: Zula was educated on the multiple health risks of obesity as well as the benefit of weight loss to improve her health. She was advised of the need for long term treatment and the importance of lifestyle modifications to improve her current health and to decrease her risk of future health problems.  AGREE: Multiple dietary modification options and treatment options were discussed and Wynette agreed to follow the recommendations documented in the above note.  ARRANGE: Daren was educated on the importance of frequent visits to treat obesity as outlined per CMS and USPSTF guidelines and agreed to schedule her next follow up appointment today.  IMarcille Blanco, CMA, am acting as transcriptionist for Starlyn Skeans, MD I have reviewed the above documentation for accuracy and completeness, and I agree with the above. -Dennard Nip, MD

## 2019-05-23 ENCOUNTER — Encounter (INDEPENDENT_AMBULATORY_CARE_PROVIDER_SITE_OTHER): Payer: Self-pay | Admitting: Family Medicine

## 2019-05-23 NOTE — Telephone Encounter (Signed)
Please review

## 2019-05-24 ENCOUNTER — Other Ambulatory Visit (INDEPENDENT_AMBULATORY_CARE_PROVIDER_SITE_OTHER): Payer: Self-pay

## 2019-05-24 DIAGNOSIS — E559 Vitamin D deficiency, unspecified: Secondary | ICD-10-CM

## 2019-05-24 MED ORDER — VITAMIN D (ERGOCALCIFEROL) 1.25 MG (50000 UNIT) PO CAPS: 50000.0000 [IU] | ORAL_CAPSULE | ORAL | 0 refills | Status: DC

## 2019-05-24 NOTE — Telephone Encounter (Signed)
Ok to RF x 1

## 2019-05-27 ENCOUNTER — Encounter: Payer: Self-pay | Admitting: Obstetrics & Gynecology

## 2019-05-27 ENCOUNTER — Ambulatory Visit (INDEPENDENT_AMBULATORY_CARE_PROVIDER_SITE_OTHER): Admitting: Obstetrics & Gynecology

## 2019-05-27 ENCOUNTER — Other Ambulatory Visit: Payer: Self-pay

## 2019-05-27 VITALS — BP 146/92 | HR 64 | Temp 97.5°F | Ht 59.0 in | Wt 224.0 lb

## 2019-05-27 DIAGNOSIS — N393 Stress incontinence (female) (male): Secondary | ICD-10-CM | POA: Diagnosis not present

## 2019-05-27 DIAGNOSIS — Z01419 Encounter for gynecological examination (general) (routine) without abnormal findings: Secondary | ICD-10-CM | POA: Diagnosis not present

## 2019-05-27 MED ORDER — ESTRADIOL 0.1 MG/GM VA CREA: TOPICAL_CREAM | VAGINAL | 0 refills | Status: DC

## 2019-05-27 MED ORDER — ESTRADIOL 0.1 MG/GM VA CREA: TOPICAL_CREAM | VAGINAL | 3 refills | Status: DC

## 2019-05-27 NOTE — Patient Instructions (Signed)
Pneumococcal Conjugate Vaccine suspension for injection  What is this medicine?  PNEUMOCOCCAL VACCINE (NEU mo KOK al vak SEEN) is a vaccine used to prevent pneumococcus bacterial infections. These bacteria can cause serious infections like pneumonia, meningitis, and blood infections. This vaccine will lower your chance of getting pneumonia. If you do get pneumonia, it can make your symptoms milder and your illness shorter. This vaccine will not treat an infection and will not cause infection. This vaccine is recommended for infants and young children, adults with certain medical conditions, and adults 65 years or older.  This medicine may be used for other purposes; ask your health care provider or pharmacist if you have questions.  COMMON BRAND NAME(S): Prevnar, Prevnar 13  What should I tell my health care provider before I take this medicine?  They need to know if you have any of these conditions:  -bleeding problems  -fever  -immune system problems  -an unusual or allergic reaction to pneumococcal vaccine, diphtheria toxoid, other vaccines, latex, other medicines, foods, dyes, or preservatives  -pregnant or trying to get pregnant  -breast-feeding  How should I use this medicine?  This vaccine is for injection into a muscle. It is given by a health care professional.  A copy of Vaccine Information Statements will be given before each vaccination. Read this sheet carefully each time. The sheet may change frequently.  Talk to your pediatrician regarding the use of this medicine in children. While this drug may be prescribed for children as young as 6 weeks old for selected conditions, precautions do apply.  Overdosage: If you think you have taken too much of this medicine contact a poison control center or emergency room at once.  NOTE: This medicine is only for you. Do not share this medicine with others.  What if I miss a dose?  It is important not to miss your dose. Call your doctor or health care professional  if you are unable to keep an appointment.  What may interact with this medicine?  -medicines for cancer chemotherapy  -medicines that suppress your immune function  -steroid medicines like prednisone or cortisone  This list may not describe all possible interactions. Give your health care provider a list of all the medicines, herbs, non-prescription drugs, or dietary supplements you use. Also tell them if you smoke, drink alcohol, or use illegal drugs. Some items may interact with your medicine.  What should I watch for while using this medicine?  Mild fever and pain should go away in 3 days or less. Report any unusual symptoms to your doctor or health care professional.  What side effects may I notice from receiving this medicine?  Side effects that you should report to your doctor or health care professional as soon as possible:  -allergic reactions like skin rash, itching or hives, swelling of the face, lips, or tongue  -breathing problems  -confused  -fast or irregular heartbeat  -fever over 102 degrees F  -seizures  -unusual bleeding or bruising  -unusual muscle weakness  Side effects that usually do not require medical attention (report to your doctor or health care professional if they continue or are bothersome):  -aches and pains  -diarrhea  -fever of 102 degrees F or less  -headache  -irritable  -loss of appetite  -pain, tender at site where injected  -trouble sleeping  This list may not describe all possible side effects. Call your doctor for medical advice about side effects. You may report side effects to FDA   at 1-800-FDA-1088.  Where should I keep my medicine?  This does not apply. This vaccine is given in a clinic, pharmacy, doctor's office, or other health care setting and will not be stored at home.  NOTE: This sheet is a summary. It may not cover all possible information. If you have questions about this medicine, talk to your doctor, pharmacist, or health care provider.   2019 Elsevier/Gold  Standard (2014-08-24 10:27:27)

## 2019-05-27 NOTE — Progress Notes (Signed)
63 y.o. G2P2 Married White or Caucasian female here for annual exam.  Denies vaginal bleeding.    Having some issues with hearing/ringing in ears.    Does have intermittent vulvar itching.  Biopsy done 2018.    Patient's last menstrual period was 02/12/2011.          Sexually active: No.  The current method of family planning is post menopausal status.    Exercising: Yes.    walking, swimming, hand weights Smoker:  no  Health Maintenance: Pap:  03/09/17 Neg. HR HPV:neg   12/29/14 neg  History of abnormal Pap:  no MMG:  08/12/18 BIRADS1:neg  Colonoscopy:  09/10/15 polyps, follow up 5 years BMD:   07/26/18 osteopenia  TDaP:  2015 Pneumonia vaccine(s):  Unsure  Shingrix:   Completed  Hep C testing: 03/05/16 neg  Screening Labs: PCP   reports that she has never smoked. She has never used smokeless tobacco. She reports previous alcohol use. She reports that she does not use drugs.  Past Medical History:  Diagnosis Date  . Arthritis    knees, back  . Cancer ([place])   . Carcinoid tumor of lung 2012   right found incidentally on chest xray eval for r/o vasxculitis   . Carpal tunnel syndrome, bilateral   . Coronary artery calcification 07/23/2016  . Double vision    history, no current problem  . Dysrhythmia    irregular occassional, no current problem  . Frequent UTI   . HNP (herniated nucleus pulposus), cervical 01/22/2011   C6-C7  Has seen dr Vertell Limber for this and Dr Tonita Cong  Positional numbness in hands no weakness    . Hypertension   . Hypothyroidism   . Infertility, female   . 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 Obstructive sleep apnea 07/27/2014   NPSG 07/2014:  AHI 85/hr, optimal cpap 10cm.  Download 08/2014:  Good compliance, breakthru apnea on 10cm.  Changed to auto 5-12cm >> good control of AHI on f/u download ONO on CPAP:      . Osteoarthritis   . PCOS (polycystic ovarian syndrome)   . SUI (stress urinary incontinence, female)    h/o    Past Surgical History:   Procedure Laterality Date  . ANTERIOR CERVICAL DECOMP/DISCECTOMY FUSION  12/24/2017   Procedure: Cervical five-six Cervical six-seven Anterior cervical decompression/discectomy/fusion;  Surgeon: Erline Levine, MD;  Location: [facility] [facility];  Service: Neurosurgery;;  . Wilmon Pali RELEASE Left 12/24/2017   Procedure: LEFT CARPAL TUNNEL RELEASE;  Surgeon: Erline Levine, MD;  Location: [facility];  Service: Neurosurgery;  Laterality: Left;  . CESAREAN SECTION     x 1 - twins  . COLONOSCOPY    . DILATATION & CURRETTAGE/HYSTEROSCOPY WITH RESECTOCOPE N/A 07/18/2013   Procedure: DILATATION & CURETTAGE/HYSTEROSCOPY WITH RESECTOCOPE;  Surgeon: Lyman Speller, MD;  Location: [facility];  Service: Gynecology;  Laterality: N/A;  mass resection  . Lung tumor removed     Rt lung carcinoid  . MOUTH SURGERY     pre-cancerous ulcer removed  . SHOULDER OPEN ROTATOR CUFF REPAIR  07/20/2010   Right  . WISDOM TOOTH EXTRACTION      Current Outpatient Medications  Medication Sig Dispense Refill  . aspirin EC 81 MG tablet Take 81 mg by mouth at bedtime.    [name] atorvastatin (LIPITOR) 40 MG tablet TAKE 1 TABLET DAILY 90 tablet 3  . CALCIUM PO Take 1 tablet by mouth at bedtime.     [name] diltiazem (CARDIZEM CD) 180 MG 24 hr capsule  Take 1 capsule (180 mg total) by mouth at bedtime. 90 capsule 3  . estradiol (ESTRACE VAGINAL) 0.1 MG/GM vaginal cream INSERT 1 GM VAGINALLY TWICE WEEKLY 42.5 g 0  . hydrochlorothiazide (MICROZIDE) 12.5 MG capsule TAKE 1 CAPSULE DAILY.   SCHEDULE ANNUAL VISIT 90 capsule 4  . metFORMIN (GLUCOPHAGE) 500 MG tablet Take 1 tablet (500 mg total) by mouth daily with breakfast. 30 tablet 0  . methocarbamol (ROBAXIN) 750 MG tablet     . Multiple Vitamins-Minerals (PRESERVISION AREDS 2 PO) Take 1 tablet by mouth 2 (two) times daily.    . naproxen sodium (ALEVE) 220 MG tablet Take 220 mg by mouth 2 (two) times daily as needed (for pain.).    [name] SYNTHROID 137 MCG tablet TAKE 1 TABLET DAILY 90 tablet 1  . TOPROL XL  50 MG 24 hr tablet TAKE 1 TABLET DAILY 90 tablet 3  . Vitamin D, Ergocalciferol, (DRISDOL) 1.25 MG (50000 UT) CAPS capsule Take 1 capsule (50,000 Units total) by mouth every 7 (seven) days. 4 capsule 0   No current facility-administered medications for this visit.     Family History  Problem Relation Age of Onset  . Stroke Mother        died age 38  . Hypertension Mother   . Sudden death Mother   . Alcoholism Mother   . Coronary artery disease Father        died age 67  . Hypertension Father   . Liver disease Father   . Alcohol abuse Father   . Obesity Father   . Parkinson's disease Brother   . Diabetes Brother   . Hypertension Brother   . Hypertension Maternal Uncle   . Colon cancer Neg Hx   . Rectal cancer Neg Hx   . Stomach cancer Neg Hx     Review of Systems  Gastrointestinal: Positive for constipation.  Genitourinary:       Pelvic pressure  Incontinence   All other systems reviewed and are negative.   Exam:   BP (!) 146/92   Pulse 64   Temp (!) 97.5 F (36.4 C) (Temporal)   Ht 4\' 11"  (1.499 m)   Wt 224 lb (101.6 kg)   LMP 02/12/2011   BMI 45.24 kg/m    Height: 4\' 11"  (149.9 cm)  Ht Readings from Last 3 Encounters:  05/27/19 4\' 11"  (1.499 m)  05/16/19 5' (1.524 m)  01/26/19 5' (1.524 m)    General appearance: alert, cooperative and appears stated age Head: Normocephalic, without obvious abnormality, atraumatic Neck: no adenopathy, supple, symmetrical, trachea midline and thyroid normal to inspection and palpation Lungs: clear to auscultation bilaterally Breasts: normal appearance, no masses or tenderness Heart: regular rate and rhythm Abdomen: soft, non-tender; bowel sounds normal; no masses,  no organomegaly Extremities: extremities normal, atraumatic, no cyanosis or edema Skin: Skin color, texture, turgor normal. No rashes or lesions Lymph nodes: Cervical, supraclavicular, and axillary nodes normal. No abnormal inguinal nodes palpated Neurologic:  Grossly normal   Pelvic: External genitalia:  no lesions              Urethra:  normal appearing urethra with no masses, tenderness or lesions              Bartholins and Skenes: normal                 Vagina: normal appearing vagina with normal color and discharge, no lesions  Cervix: no lesions              Pap taken: No. Bimanual Exam:  Uterus:  normal size, contour, position, consistency, mobility, non-tender              Adnexa: normal adnexa and no mass, fullness, tenderness               Rectovaginal: Confirms               Anus:  normal sphincter tone, no lesions  Chaperone was present for exam.  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  Pt doing 3D MMG pap smear with neg HR HPV 4/18.  Not indicated today. Colonoscopy and BMD are UTD Referral to Dr. Matilde Sprang for possible surgical correction of urinary incontinence RF for estrace vaginal cream 1gm pv twice weekly.  #42.5 gram tube/3RF Lab work is done with Dr. Regis Bill Return annually or prn

## 2019-05-30 ENCOUNTER — Ambulatory Visit (INDEPENDENT_AMBULATORY_CARE_PROVIDER_SITE_OTHER): Admitting: Family Medicine

## 2019-05-30 ENCOUNTER — Other Ambulatory Visit: Payer: Self-pay

## 2019-05-30 ENCOUNTER — Encounter (INDEPENDENT_AMBULATORY_CARE_PROVIDER_SITE_OTHER): Payer: Self-pay | Admitting: Family Medicine

## 2019-05-30 VITALS — BP 117/74 | HR 67 | Temp 98.0°F | Ht 59.0 in | Wt 222.0 lb

## 2019-05-30 DIAGNOSIS — I10 Essential (primary) hypertension: Secondary | ICD-10-CM

## 2019-05-30 DIAGNOSIS — Z6841 Body Mass Index (BMI) 40.0 and over, adult: Secondary | ICD-10-CM

## 2019-05-30 DIAGNOSIS — R7303 Prediabetes: Secondary | ICD-10-CM

## 2019-05-30 DIAGNOSIS — Z9189 Other specified personal risk factors, not elsewhere classified: Secondary | ICD-10-CM

## 2019-05-30 MED ORDER — METFORMIN HCL 500 MG PO TABS: 500.0000 mg | ORAL_TABLET | Freq: Two times a day (BID) | ORAL | 0 refills | Status: DC

## 2019-05-30 NOTE — Progress Notes (Deleted)
Virtual Visit via Video Note  I connected with@ on 05/30/19 at  9:15 AM EDT by a video enabled telemedicine application and verified that I am speaking with the correct person using two identifiers. Location patient: home Location provider:work or home office Persons participating in the virtual visit: patient, provider  [place] national recommendations  regarding COVID 19 pandemic   video visit is advised over in office visit for this patient.  Patient aware  of the limitations of evaluation and management by telemedicine and  availability of in person appointments. and agreed to proceed.   HPI: Nichole Smith presents for video visit    ROS: See pertinent positives and negatives per HPI.  Past Medical History:  Diagnosis Date  . Arthritis    knees, back  . Cancer ([place])   . Carcinoid tumor of lung 2012   right found incidentally on chest xray eval for r/o vasxculitis   . Carpal tunnel syndrome, bilateral   . Coronary artery calcification 07/23/2016  . Double vision    history, no current problem  . Dysrhythmia    irregular occassional, no current problem  . Frequent UTI   . HNP (herniated nucleus pulposus), cervical 01/22/2011   C6-C7  Has seen dr Vertell Limber for this and Dr Tonita Cong  Positional numbness in hands no weakness    . Hypertension   . Hypothyroidism   . Infertility, female   . 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 Obstructive sleep apnea 07/27/2014   NPSG 07/2014:  AHI 85/hr, optimal cpap 10cm.  Download 08/2014:  Good compliance, breakthru apnea on 10cm.  Changed to auto 5-12cm >> good control of AHI on f/u download ONO on CPAP:      . Osteoarthritis   . PCOS (polycystic ovarian syndrome)   . SUI (stress urinary incontinence, female)    h/o    Past Surgical History:  Procedure Laterality Date  . ANTERIOR CERVICAL DECOMP/DISCECTOMY FUSION  12/24/2017   Procedure: Cervical five-six Cervical six-seven Anterior cervical decompression/discectomy/fusion;  Surgeon:  Erline Levine, MD;  Location: [facility];  Service: Neurosurgery;;  . Wilmon Pali RELEASE Left 12/24/2017   Procedure: LEFT CARPAL TUNNEL RELEASE;  Surgeon: Erline Levine, MD;  Location: [place];  Service: Neurosurgery;  Laterality: Left;  . CESAREAN SECTION     x 1 - twins  . COLONOSCOPY    . DILATATION & CURRETTAGE/HYSTEROSCOPY WITH RESECTOCOPE N/A 07/18/2013   Procedure: DILATATION & CURETTAGE/HYSTEROSCOPY WITH RESECTOCOPE;  Surgeon: Lyman Speller, MD;  Location: [facility];  Service: Gynecology;  Laterality: N/A;  mass resection  . Lung tumor removed     Rt lung carcinoid  . MOUTH SURGERY     pre-cancerous ulcer removed  . SHOULDER OPEN ROTATOR CUFF REPAIR  07/20/2010   Right  . WISDOM TOOTH EXTRACTION      Family History  Problem Relation Age of Onset  . Stroke Mother        died age 71  . Hypertension Mother   . Sudden death Mother   . Alcoholism Mother   . Coronary artery disease Father        died age 59  . Hypertension Father   . Liver disease Father   . Alcohol abuse Father   . Obesity Father   . Parkinson's disease Brother   . Diabetes Brother   . Hypertension Brother   . Hypertension Maternal Uncle   . Colon cancer Neg Hx   . Rectal cancer Neg Hx   . Stomach cancer Neg  Hx     Social History   Tobacco Use  . Smoking status: Never Smoker  . Smokeless tobacco: Never Used  Substance Use Topics  . Alcohol use: Not Currently  . Drug use: No      Current Outpatient Medications:  .  aspirin EC 81 MG tablet, Take 81 mg by mouth at bedtime., Disp: , Rfl:  .  atorvastatin (LIPITOR) 40 MG tablet, TAKE 1 TABLET DAILY, Disp: 90 tablet, Rfl: 3 .  CALCIUM PO, Take 1 tablet by mouth at bedtime. , Disp: , Rfl:  .  diltiazem (CARDIZEM CD) 180 MG 24 hr capsule, Take 1 capsule (180 mg total) by mouth at bedtime., Disp: 90 capsule, Rfl: 3 .  estradiol (ESTRACE VAGINAL) 0.1 MG/GM vaginal cream, INSERT 1 GM VAGINALLY TWICE WEEKLY, Disp: 42.5 g, Rfl: 3 .  hydrochlorothiazide  (MICROZIDE) 12.5 MG capsule, TAKE 1 CAPSULE DAILY.   SCHEDULE ANNUAL VISIT, Disp: 90 capsule, Rfl: 4 .  metFORMIN (GLUCOPHAGE) 500 MG tablet, Take 1 tablet (500 mg total) by mouth daily with breakfast., Disp: 30 tablet, Rfl: 0 .  methocarbamol (ROBAXIN) 750 MG tablet, , Disp: , Rfl:  .  Multiple Vitamins-Minerals (PRESERVISION AREDS 2 PO), Take 1 tablet by mouth 2 (two) times daily., Disp: , Rfl:  .  naproxen sodium (ALEVE) 220 MG tablet, Take 220 mg by mouth 2 (two) times daily as needed (for pain.)., Disp: , Rfl:  .  SYNTHROID 137 MCG tablet, TAKE 1 TABLET DAILY, Disp: 90 tablet, Rfl: 1 .  TOPROL XL 50 MG 24 hr tablet, TAKE 1 TABLET DAILY, Disp: 90 tablet, Rfl: 3 .  Vitamin D, Ergocalciferol, (DRISDOL) 1.25 MG (50000 UT) CAPS capsule, Take 1 capsule (50,000 Units total) by mouth every 7 (seven) days., Disp: 4 capsule, Rfl: 0  EXAM: BP Readings from Last 3 Encounters:  05/27/19 (!) 146/92  05/16/19 136/79  01/26/19 (!) 145/81    VITALS per patient if applicable:  GENERAL: alert, oriented, appears well and in no acute distress  HEENT: atraumatic, conjunttiva clear, no obvious abnormalities on inspection of external nose and ears  NECK: normal movements of the head and neck  LUNGS: on inspection no signs of respiratory distress, breathing rate appears normal, no obvious gross SOB, gasping or wheezing  CV: no obvious cyanosis  MS: moves all visible extremities without noticeable abnormality  PSYCH/NEURO: pleasant and cooperative, no obvious depression or anxiety, speech and thought processing grossly intact Lab Results  Component Value Date   WBC 7.8 01/12/2019   HGB 16.3 (H) 01/12/2019   HCT 47.8 (H) 01/12/2019   PLT 228.0 06/07/2018   GLUCOSE 108 (H) 01/12/2019   CHOL 133 01/12/2019   TRIG 62 01/12/2019   HDL 57 01/12/2019   LDLCALC 64 01/12/2019   ALT 26 01/12/2019   AST 24 01/12/2019   NA 140 01/12/2019   K 3.9 01/12/2019   CL 98 01/12/2019   CREATININE 0.64  01/12/2019   BUN 22 01/12/2019   CO2 27 01/12/2019   TSH 2.490 01/12/2019   INR 0.96 06/11/2011   HGBA1C 5.9 (H) 01/12/2019    ASSESSMENT AND PLAN:  Discussed the following assessment and plan:  No diagnosis found.  Counseled.   Expectant management and discussion of plan and treatment with opportunity to ask questions and all were answered. The patient agreed with the plan and demonstrated an understanding of the instructions.   Advised to call back or seek an in-person evaluation if worsening  or having  further concerns .  I provided *** minutes of non-face-to-face time during this encounter.   Shanon Ace, MD

## 2019-05-31 ENCOUNTER — Ambulatory Visit: Admitting: Internal Medicine

## 2019-05-31 ENCOUNTER — Ambulatory Visit (INDEPENDENT_AMBULATORY_CARE_PROVIDER_SITE_OTHER): Admitting: Internal Medicine

## 2019-05-31 ENCOUNTER — Encounter: Payer: Self-pay | Admitting: Internal Medicine

## 2019-05-31 DIAGNOSIS — E063 Autoimmune thyroiditis: Secondary | ICD-10-CM

## 2019-05-31 DIAGNOSIS — Z79899 Other long term (current) drug therapy: Secondary | ICD-10-CM | POA: Diagnosis not present

## 2019-05-31 DIAGNOSIS — I10 Essential (primary) hypertension: Secondary | ICD-10-CM

## 2019-05-31 DIAGNOSIS — Z6841 Body Mass Index (BMI) 40.0 and over, adult: Secondary | ICD-10-CM

## 2019-05-31 DIAGNOSIS — E66813 Obesity, class 3: Secondary | ICD-10-CM

## 2019-05-31 NOTE — Progress Notes (Signed)
Virtual Visit via Video Note  I connected with@ on 05/31/19 at  2:30 PM EDT by a video enabled telemedicine application and verified that I am speaking with the correct person using two identifiers. Location patient: home Location provider:work  office Persons participating in the virtual visit: patient, provider  [place] national recommendations  regarding COVID 19 pandemic   video visit is advised over in office visit for this patient.  Patient aware  of the limitations of evaluation and management by telemedicine and  availability of in person appointments. and agreed to proceed.   HPI: Nichole Smith presents for video visit  Fu last seena year ago but has multiple med conditions and specialists and checking in   THyroid no change med   Obesity in  Specialty clinic an doing ok  18 pounds  Since feb felt to be on track but  Not fast just recently put on metformin      To  Help but no diabetic   CV  Dec metoprolol  bp   Had gyne exam dr Sabra Heck  Now on VIt d    Has more energy ? If from bits   OSA cpap not sure helping .   Sociasl sitting grandchildren 3 and 5 yos  At this time uncertain when school starts in fall  ROS: See pertinent positives and negatives per HPI.  Past Medical History:  Diagnosis Date  . Arthritis    knees, back  . Cancer ([place])   . Carcinoid tumor of lung 2012   right found incidentally on chest xray eval for r/o vasxculitis   . Carpal tunnel syndrome, bilateral   . Coronary artery calcification 07/23/2016  . Double vision    history, no current problem  . Dysrhythmia    irregular occassional, no current problem  . Frequent UTI   . HNP (herniated nucleus pulposus), cervical 01/22/2011   C6-C7  Has seen dr Vertell Limber for this and Dr Tonita Cong  Positional numbness in hands no weakness    . Hypertension   . Hypothyroidism   . Infertility, female   . 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 Obstructive sleep apnea 07/27/2014   NPSG 07/2014:  AHI 85/hr,  optimal cpap 10cm.  Download 08/2014:  Good compliance, breakthru apnea on 10cm.  Changed to auto 5-12cm >> good control of AHI on f/u download ONO on CPAP:      . Osteoarthritis   . PCOS (polycystic ovarian syndrome)   . SUI (stress urinary incontinence, female)    h/o    Past Surgical History:  Procedure Laterality Date  . ANTERIOR CERVICAL DECOMP/DISCECTOMY FUSION  12/24/2017   Procedure: Cervical five-six Cervical six-seven Anterior cervical decompression/discectomy/fusion;  Surgeon: Erline Levine, MD;  Location: [facility];  Service: Neurosurgery;;  . Wilmon Pali RELEASE Left 12/24/2017   Procedure: LEFT CARPAL TUNNEL RELEASE;  Surgeon: Erline Levine, MD;  Location: [facility];  Service: Neurosurgery;  Laterality: Left;  . CESAREAN SECTION     x 1 - twins  . COLONOSCOPY    . DILATATION & CURRETTAGE/HYSTEROSCOPY WITH RESECTOCOPE N/A 07/18/2013   Procedure: DILATATION & CURETTAGE/HYSTEROSCOPY WITH RESECTOCOPE;  Surgeon: Lyman Speller, MD;  Location: [facility];  Service: Gynecology;  Laterality: N/A;  mass resection  . Lung tumor removed     Rt lung carcinoid  . MOUTH SURGERY     pre-cancerous ulcer removed  . SHOULDER OPEN ROTATOR CUFF REPAIR  07/20/2010   Right  . WISDOM TOOTH EXTRACTION  Family History  Problem Relation Age of Onset  . Stroke Mother        died age 37  . Hypertension Mother   . Sudden death Mother   . Alcoholism Mother   . Coronary artery disease Father        died age 24  . Hypertension Father   . Liver disease Father   . Alcohol abuse Father   . Obesity Father   . Parkinson's disease Brother   . Diabetes Brother   . Hypertension Brother   . Hypertension Maternal Uncle   . Colon cancer Neg Hx   . Rectal cancer Neg Hx   . Stomach cancer Neg Hx     Social History   Tobacco Use  . Smoking status: Never Smoker  . Smokeless tobacco: Never Used  Substance Use Topics  . Alcohol use: Not Currently  . Drug use: No      Current Outpatient  Medications:  .  aspirin EC 81 MG tablet, Take 81 mg by mouth at bedtime., Disp: , Rfl:  .  atorvastatin (LIPITOR) 40 MG tablet, TAKE 1 TABLET DAILY, Disp: 90 tablet, Rfl: 3 .  CALCIUM PO, Take 1 tablet by mouth at bedtime. , Disp: , Rfl:  .  diltiazem (CARDIZEM CD) 180 MG 24 hr capsule, Take 1 capsule (180 mg total) by mouth at bedtime., Disp: 90 capsule, Rfl: 3 .  estradiol (ESTRACE VAGINAL) 0.1 MG/GM vaginal cream, INSERT 1 GM VAGINALLY TWICE WEEKLY, Disp: 42.5 g, Rfl: 3 .  hydrochlorothiazide (MICROZIDE) 12.5 MG capsule, TAKE 1 CAPSULE DAILY.   SCHEDULE ANNUAL VISIT, Disp: 90 capsule, Rfl: 4 .  metFORMIN (GLUCOPHAGE) 500 MG tablet, Take 1 tablet (500 mg total) by mouth 2 (two) times daily with a meal., Disp: 60 tablet, Rfl: 0 .  methocarbamol (ROBAXIN) 750 MG tablet, , Disp: , Rfl:  .  Multiple Vitamins-Minerals (PRESERVISION AREDS 2 PO), Take 1 tablet by mouth 2 (two) times daily., Disp: , Rfl:  .  naproxen sodium (ALEVE) 220 MG tablet, Take 220 mg by mouth 2 (two) times daily as needed (for pain.)., Disp: , Rfl:  .  SYNTHROID 137 MCG tablet, TAKE 1 TABLET DAILY, Disp: 90 tablet, Rfl: 1 .  TOPROL XL 50 MG 24 hr tablet, TAKE 1 TABLET DAILY, Disp: 90 tablet, Rfl: 3 .  Vitamin D, Ergocalciferol, (DRISDOL) 1.25 MG (50000 UT) CAPS capsule, Take 1 capsule (50,000 Units total) by mouth every 7 (seven) days., Disp: 4 capsule, Rfl: 0  EXAM: BP Readings from Last 3 Encounters:  05/30/19 117/74  05/27/19 (!) 146/92  05/16/19 136/79   Wt Readings from Last 3 Encounters:  05/30/19 222 lb (100.7 kg)  05/27/19 224 lb (101.6 kg)  05/16/19 223 lb (101.2 kg)    VITALS per patient if applicable: GENERAL: alert, oriented, appears well and in no acute distress HEENT: atraumatic, conjunttiva clear, no obvious abnormalities on inspection of external nose and ears NECK: normal movements of the head and neck LUNGS: on inspection no signs of respiratory distress, breathing rate appears normal, no obvious  gross SOB, gasping or wheezing CV: no obvious cyanosis PSYCH/NEURO: pleasant and cooperative, no obvious depression or anxiety, speech and thought processing grossly intact Lab Results  Component Value Date   WBC 7.8 01/12/2019   HGB 16.3 (H) 01/12/2019   HCT 47.8 (H) 01/12/2019   PLT 228.0 06/07/2018   GLUCOSE 108 (H) 01/12/2019   CHOL 133 01/12/2019   TRIG 62 01/12/2019   HDL 57 01/12/2019  LDLCALC 64 01/12/2019   ALT 26 01/12/2019   AST 24 01/12/2019   NA 140 01/12/2019   K 3.9 01/12/2019   CL 98 01/12/2019   CREATININE 0.64 01/12/2019   BUN 22 01/12/2019   CO2 27 01/12/2019   TSH 2.490 01/12/2019   INR 0.96 06/11/2011   HGBA1C 5.9 (H) 01/12/2019    ASSESSMENT AND PLAN:  Discussed the following assessment and plan:    ICD-10-CM   1. Medication management  Z79.899   2. Essential hypertension  I10   3. 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 Class 3 severe obesity with serious comorbidity and body mass index (BMI) of 40.0 to 44.9 in adult, unspecified obesity type ([place])  E66.01    Z68.41    under treatment program    Counseled.  Plan  cpx in September or there abouts   .   Expectant management and discussion of plan and treatment with opportunity to ask questions and all were answered. The patient agreed with the plan and demonstrated an understanding of the instructions.   Advised to call back or seek an in-person evaluation if worsening  or having  further concerns . Shanon Ace, MD

## 2019-05-31 NOTE — Progress Notes (Signed)
Office: [phone]  /  Fax: [phone]   HPI:   Chief Complaint: OBESITY Nichole Smith is here to discuss her progress with her obesity treatment plan. She is on the  keep a food journal with 1500-1600 calories and 90g of protein daily and is following her eating plan approximately 80-90 % of the time. She states she is exercising by walking for 105 minutes, yoga for 30 minutes, and swimming for 90 minutes over the last 2 weeks.  Nichole Smith continues to well with weight loss. She likes the freedom of journaling gives her and is doing well with meeting her protein and vegetables goals.   Her weight is 222 lb (100.7 kg) today and has had a weight loss of 1 pounds over a period of 2 weeks since her last visit. She has lost 16 lbs since starting treatment with [name].  Pre-Diabetes Nichole Smith has a diagnosis of prediabetes based on her elevated HgA1c and was informed this puts her at greater risk of developing diabetes. She is taking metformin currently and continues to work on diet and exercise to decrease risk of diabetes. She denies nausea or hypoglycemia. She admits to still struggling with polyphagia.   Hypertension Nichole Smith is a 63 y.o. female with hypertension.  Nichole Smith denies chest pain, headache, dizziness, or shortness of breath on exertion. She is working weight loss to help control her blood pressure with the goal of decreasing her risk of heart attack and stroke. Nichole Smith blood pressure was increased at GYN last week but is concerned today.   At risk for diabetes Nichole Smith is at higher than averagerisk for developing diabetes due to her obesity. She currently denies polyuria or polydipsia.  ASSESSMENT AND PLAN:  Prediabetes - Plan: metFORMIN (GLUCOPHAGE) 500 MG tablet  Essential hypertension  At risk for diabetes mellitus  Class 3 severe obesity with serious comorbidity and body mass index (BMI) of 40.0 to 44.9 in adult, unspecified obesity type Nichole Smith)  PLAN: Pre-Diabetes Nichole Smith  will continue to work on weight loss, exercise, and decreasing simple carbohydrates in her diet to help decrease the risk of diabetes. We dicussed metformin including benefits and risks. She was informed that eating too many simple carbohydrates or too many calories at one sitting increases the likelihood of GI side effects. Nichole Smith agreed to increase Metformin 500 mg BID #60 with no refill. Nichole Smith agreed to follow up with [name] as directed to monitor her progress.  Hypertension We discussed sodium restriction, working on healthy weight loss, and a regular exercise program as the means to achieve improved blood pressure control. Nichole Smith agreed with this plan and agreed to follow up as directed. We will continue to monitor her blood pressure as well as her progress with the above lifestyle modifications. She will continue her medications as prescribed and will watch for signs of hypotension as she continues her lifestyle modifications.  Diabetes risk counselling Nichole Smith was given extended (15 minutes) diabetes prevention counseling today. She is 63 y.o. female and has risk factors for diabetes including obesity. We discussed intensive lifestyle modifications today with an emphasis on weight loss as well as increasing exercise and decreasing simple carbohydrates in her diet.  Obesity Nichole Smith is currently in the action stage of change. As such, her goal is to continue with weight loss efforts She has agreed to keep a food journal with 1500-1600 calories and 90g of protein daily.  Nichole Smith has been instructed to work up to a goal of 150 minutes of combined cardio and  strengthening exercise per week for weight loss and overall health benefits. We discussed the following Behavioral Modification Stratagies today:travel eating strategies, holiday eating strategies, increasing lean protein intake and holiday eating strategies.    Nichole Smith has agreed to follow up with our clinic in 2-3 weeks. She was informed of the importance  of frequent follow up visits to maximize her success with intensive lifestyle modifications for her multiple health conditions.  ALLERGIES: Allergies  Allergen Reactions  . Neomycin Other (See Comments)    Inflammation--eye drop   . Ace Inhibitors Cough    MEDICATIONS: Current Outpatient Medications on File Prior to Visit  Medication Sig Dispense Refill  . aspirin EC 81 MG tablet Take 81 mg by mouth at bedtime.    [name] atorvastatin (LIPITOR) 40 MG tablet TAKE 1 TABLET DAILY 90 tablet 3  . CALCIUM PO Take 1 tablet by mouth at bedtime.     [name] diltiazem (CARDIZEM CD) 180 MG 24 hr capsule Take 1 capsule (180 mg total) by mouth at bedtime. 90 capsule 3  . estradiol (ESTRACE VAGINAL) 0.1 MG/GM vaginal cream INSERT 1 GM VAGINALLY TWICE WEEKLY 42.5 g 3  . hydrochlorothiazide (MICROZIDE) 12.5 MG capsule TAKE 1 CAPSULE DAILY.   SCHEDULE ANNUAL VISIT 90 capsule 4  . methocarbamol (ROBAXIN) 750 MG tablet     . Multiple Vitamins-Minerals (PRESERVISION AREDS 2 PO) Take 1 tablet by mouth 2 (two) times daily.    . naproxen sodium (ALEVE) 220 MG tablet Take 220 mg by mouth 2 (two) times daily as needed (for pain.).    [name] SYNTHROID 137 MCG tablet TAKE 1 TABLET DAILY 90 tablet 1  . TOPROL XL 50 MG 24 hr tablet TAKE 1 TABLET DAILY 90 tablet 3  . Vitamin D, Ergocalciferol, (DRISDOL) 1.25 MG (50000 UT) CAPS capsule Take 1 capsule (50,000 Units total) by mouth every 7 (seven) days. 4 capsule 0   No current facility-administered medications on file prior to visit.     PAST MEDICAL HISTORY: Past Medical History:  Diagnosis Date  . Arthritis    knees, back  . Cancer ([place])   . Carcinoid tumor of lung 2012   right found incidentally on chest xray eval for r/o vasxculitis   . Carpal tunnel syndrome, bilateral   . Coronary artery calcification 07/23/2016  . Double vision    history, no current problem  . Dysrhythmia    irregular occassional, no current problem  . Frequent UTI   . HNP (herniated nucleus  pulposus), cervical 01/22/2011   C6-C7  Has seen dr Vertell Limber for this and Dr Tonita Cong  Positional numbness in hands no weakness    . Hypertension   . Hypothyroidism   . Infertility, female   . 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 Obstructive sleep apnea 07/27/2014   NPSG 07/2014:  AHI 85/hr, optimal cpap 10cm.  Download 08/2014:  Good compliance, breakthru apnea on 10cm.  Changed to auto 5-12cm >> good control of AHI on f/u download ONO on CPAP:      . Osteoarthritis   . PCOS (polycystic ovarian syndrome)   . SUI (stress urinary incontinence, female)    h/o    PAST SURGICAL HISTORY: Past Surgical History:  Procedure Laterality Date  . ANTERIOR CERVICAL DECOMP/DISCECTOMY FUSION  12/24/2017   Procedure: Cervical five-six Cervical six-seven Anterior cervical decompression/discectomy/fusion;  Surgeon: Erline Levine, MD;  Location: [facility];  Service: Neurosurgery;;  . Wilmon Pali RELEASE Left 12/24/2017   Procedure: LEFT CARPAL TUNNEL RELEASE;  Surgeon: Erline Levine,  MD;  Location: [facility];  Service: Neurosurgery;  Laterality: Left;  . CESAREAN SECTION     x 1 - twins  . COLONOSCOPY    . DILATATION & CURRETTAGE/HYSTEROSCOPY WITH RESECTOCOPE N/A 07/18/2013   Procedure: DILATATION & CURETTAGE/HYSTEROSCOPY WITH RESECTOCOPE;  Surgeon: Lyman Speller, MD;  Location: [facility];  Service: Gynecology;  Laterality: N/A;  mass resection  . Lung tumor removed     Rt lung carcinoid  . MOUTH SURGERY     pre-cancerous ulcer removed  . SHOULDER OPEN ROTATOR CUFF REPAIR  07/20/2010   Right  . WISDOM TOOTH EXTRACTION      SOCIAL HISTORY: Social History   Tobacco Use  . Smoking status: Never Smoker  . Smokeless tobacco: Never Used  Substance Use Topics  . Alcohol use: Not Currently  . Drug use: No    FAMILY HISTORY: Family History  Problem Relation Age of Onset  . Stroke Mother        died age 48  . Hypertension Mother   . Sudden death Mother   . Alcoholism Mother   . Coronary artery  disease Father        died age 8  . Hypertension Father   . Liver disease Father   . Alcohol abuse Father   . Obesity Father   . Parkinson's disease Brother   . Diabetes Brother   . Hypertension Brother   . Hypertension Maternal Uncle   . Colon cancer Neg Hx   . Rectal cancer Neg Hx   . Stomach cancer Neg Hx     ROS: Review of Systems  Constitutional: Positive for weight loss.  Respiratory: Negative for shortness of breath.   Cardiovascular: Negative for chest pain.  Gastrointestinal: Negative for nausea and vomiting.  Neurological: Negative for dizziness and headaches.  Endo/Heme/Allergies: Negative for polydipsia.       Negative for polyuria Negative for hypoglycemia    PHYSICAL EXAM: Blood pressure 117/74, pulse 67, temperature 98 F (36.7 C), temperature source Oral, height 4\' 11"  (1.499 m), weight 222 lb (100.7 kg), last menstrual period 02/12/2011, SpO2 93 %. Body mass index is 44.84 kg/m. Physical Exam Vitals signs reviewed.  Constitutional:      Appearance: Normal appearance. She is obese.  HENT:     Head: Normocephalic.     Nose: Nose normal.  Neck:     Musculoskeletal: Normal range of motion.  Cardiovascular:     Rate and Rhythm: Normal rate.  Pulmonary:     Effort: Pulmonary effort is normal.  Musculoskeletal: Normal range of motion.  Skin:    General: Skin is warm and dry.  Neurological:     Mental Status: She is alert and oriented to person, place, and time.  Psychiatric:        Mood and Affect: Mood normal.        Behavior: Behavior normal.     RECENT LABS AND TESTS: BMET    Component Value Date/Time   NA 140 01/12/2019 1040   K 3.9 01/12/2019 1040   CL 98 01/12/2019 1040   CO2 27 01/12/2019 1040   GLUCOSE 108 (H) 01/12/2019 1040   GLUCOSE 100 (H) 06/07/2018 0739   BUN 22 01/12/2019 1040   CREATININE 0.64 01/12/2019 1040   CALCIUM 9.5 01/12/2019 1040   GFRNONAA 95 01/12/2019 1040   GFRAA 109 01/12/2019 1040   Lab Results   Component Value Date   HGBA1C 5.9 (H) 01/12/2019   HGBA1C 5.9 06/07/2018   Lab Results  Component Value Date   INSULIN 20.4 01/12/2019   CBC    Component Value Date/Time   WBC 7.8 01/12/2019 1040   WBC 7.3 06/07/2018 0739   RBC 5.24 01/12/2019 1040   RBC 4.86 06/07/2018 0739   HGB 16.3 (H) 01/12/2019 1040   HGB 15.4 03/13/2014 1422   HCT 47.8 (H) 01/12/2019 1040   HCT 45.3 03/13/2014 1422   PLT 228.0 06/07/2018 0739   PLT 245 03/13/2014 1422   MCV 91 01/12/2019 1040   MCV 89.0 03/13/2014 1422   MCH 31.1 01/12/2019 1040   MCH 31.3 12/21/2017 1049   MCHC 34.1 01/12/2019 1040   MCHC 34.3 06/07/2018 0739   RDW 12.0 01/12/2019 1040   RDW 13.2 03/13/2014 1422   LYMPHSABS 2.3 01/12/2019 1040   LYMPHSABS 3.2 03/13/2014 1422   MONOABS 0.6 06/07/2018 0739   MONOABS 0.7 03/13/2014 1422   EOSABS 0.3 01/12/2019 1040   BASOSABS 0.1 01/12/2019 1040   BASOSABS 0.0 03/13/2014 1422   Iron/TIBC/Ferritin/ %Sat No results found for: IRON, TIBC, FERRITIN, IRONPCTSAT Lipid Panel     Component Value Date/Time   CHOL 133 01/12/2019 1040   CHOL 110 11/03/2016 0000   TRIG 62 01/12/2019 1040   TRIG 54 11/03/2016 0000   HDL 57 01/12/2019 1040   HDL 48 (L) 11/03/2016 0000   CHOLHDL 2 06/07/2018 0739   VLDL 10.8 06/07/2018 0739   LDLCALC 64 01/12/2019 1040   LDLCALC 49 11/03/2016 0000   Hepatic Function Panel     Component Value Date/Time   PROT 7.3 01/12/2019 1040   ALBUMIN 4.2 01/12/2019 1040   AST 24 01/12/2019 1040   ALT 26 01/12/2019 1040   ALKPHOS 91 01/12/2019 1040   BILITOT 0.6 01/12/2019 1040   BILIDIR 0.1 06/07/2018 0739   IBILI 0.4 09/08/2016 0749      Component Value Date/Time   TSH 2.490 01/12/2019 1040   TSH 3.66 06/07/2018 0739   TSH 1.40 04/30/2017 0838      OBESITY BEHAVIORAL INTERVENTION VISIT  Today's visit was # 9   Starting weight: 238 lb Starting date: 01/12/19 Today's weight : Weight: 222 lb (100.7 kg)  Today's date: 05/30/19 Total lbs lost to  date: 16 lbs At least 15 minutes were spent on discussing the following behavioral intervention visit.   ASK: We discussed the diagnosis of obesity with Nichole Smith today and Nichole Smith agreed to give [name] permission to discuss obesity behavioral modification therapy today.  ASSESS: Nichole Smith has the diagnosis of obesity and her BMI today is 44.81.  Nichole Smith is in the action stage of change   ADVISE: Nichole Smith was educated on the multiple health risks of obesity as well as the benefit of weight loss to improve her health. She was advised of the need for long term treatment and the importance of lifestyle modifications to improve her current health and to decrease her risk of future health problems.  AGREE: Multiple dietary modification options and treatment options were discussed and  Jackie agreed to follow the recommendations documented in the above note.  ARRANGE: Nikia was educated on the importance of frequent visits to treat obesity as outlined per CMS and USPSTF guidelines and agreed to schedule her next follow up appointment today.  I, Renee Ramus, am acting as transcriptionist for Dennard Nip, MD   I have reviewed the above documentation for accuracy and completeness, and I agree with the above. -Dennard Nip, MD

## 2019-06-06 ENCOUNTER — Encounter (INDEPENDENT_AMBULATORY_CARE_PROVIDER_SITE_OTHER): Payer: Self-pay | Admitting: Family Medicine

## 2019-06-06 NOTE — Telephone Encounter (Signed)
Please review

## 2019-06-19 ENCOUNTER — Encounter: Payer: Self-pay | Admitting: Obstetrics & Gynecology

## 2019-06-20 ENCOUNTER — Encounter (INDEPENDENT_AMBULATORY_CARE_PROVIDER_SITE_OTHER): Payer: Self-pay | Admitting: Family Medicine

## 2019-06-20 ENCOUNTER — Ambulatory Visit (INDEPENDENT_AMBULATORY_CARE_PROVIDER_SITE_OTHER): Admitting: Family Medicine

## 2019-06-20 ENCOUNTER — Other Ambulatory Visit: Payer: Self-pay

## 2019-06-20 DIAGNOSIS — Z6841 Body Mass Index (BMI) 40.0 and over, adult: Secondary | ICD-10-CM

## 2019-06-20 DIAGNOSIS — E559 Vitamin D deficiency, unspecified: Secondary | ICD-10-CM

## 2019-06-20 DIAGNOSIS — R7303 Prediabetes: Secondary | ICD-10-CM

## 2019-06-20 MED ORDER — VITAMIN D (ERGOCALCIFEROL) 1.25 MG (50000 UNIT) PO CAPS: 50000.0000 [IU] | ORAL_CAPSULE | ORAL | 0 refills | Status: DC

## 2019-06-20 MED ORDER — METFORMIN HCL 500 MG PO TABS: 500.0000 mg | ORAL_TABLET | Freq: Two times a day (BID) | ORAL | 0 refills | Status: DC

## 2019-06-21 NOTE — Progress Notes (Signed)
Office: [phone]  /  Fax: [phone] TeleHealth Visit:  [name] has verbally consented to this TeleHealth visit today. The patient is located at home, the provider is located at the [facility] office. The participants in this visit include the listed provider and patient. The visit was conducted today via telephone call (FaceTime failed - changed to telephone call).  HPI:   Chief Complaint: OBESITY Nichole Smith is here to discuss her progress with her obesity treatment plan. She is keeping a food journal with 1500-1600 calories and 90 grams of protein and is following her eating plan approximately 100% of the time. She states she is pool walking 45-75 minutes 6 times per week. Nichole Smith feels she has lost another 2 lbs since her last visit. She was on vacation and did a lot of hiking in the mountains and continued to keep a food journal. We were unable to weigh the patient today for this TeleHealth visit. She feels as if she has lost 3 lbs since her last visit. She has lost 16 lbs since starting treatment with Nichole Smith.  Pre-Diabetes Nichole Smith has a diagnosis of prediabetes based on her elevated Hgb A1c and was informed this puts her at greater risk of developing diabetes. She has been tolerating metformin well but increased simple carbs this weekend and has struggled with diarrhea, which is loose 3-4 times per day and non-bloody. She also just got back from vacation in the mountains. She continues to work on diet and exercise to decrease risk of diabetes. She denies nausea or hypoglycemia.  Vitamin D deficiency Nichole Smith has a diagnosis of Vitamin D deficiency, which is not yet at goal. She is currently stable on prescription Vit D and denies nausea, vomiting or muscle weakness.  ASSESSMENT AND PLAN:  Prediabetes - Plan: metFORMIN (GLUCOPHAGE) 500 MG tablet  Vitamin D deficiency - Plan: Vitamin D, Ergocalciferol, (DRISDOL) 1.25 MG (50000 UT) CAPS capsule  Class 3 severe obesity with  serious comorbidity and body mass index (BMI) of 45.0 to 49.9 in adult, unspecified obesity type ([place])  PLAN:  Pre-Diabetes [name] will continue to work on weight loss, exercise, and decreasing simple carbohydrates in her diet to help decrease the risk of diabetes. We dicussed metformin including benefits and risks. She was informed that eating too many simple carbohydrates or too many calories at one sitting increases the likelihood of GI side effects. [name] was given a refill on her metformin 500 mg #60 with 0 refills and agrees to follow-up with our clinic in 3 weeks. She was instructed to hold the metformin until symptoms improve. She will call if no improvement in 1-2 days. She will increase her water intake.  Vitamin D Deficiency [name] was informed that low Vitamin D levels contributes to fatigue and are associated with obesity, breast, and colon cancer. She agrees to continue to take prescription Vit D @ 50,000 IU every week #13 with 0 refills and will follow-up for routine testing of Vitamin D, at least 2-3 times per year. She was informed of the risk of over-replacement of Vitamin D and agrees to not increase her dose unless she discusses this with Nichole Smith first. [name] agrees to follow-up with our clinic in 3 weeks.  Obesity Nichole Smith is currently in the action stage of change. As such, her goal is to continue with weight loss efforts. She has agreed to keep a food journal with 1500-1600 calories and 90+ grams of protein.  Nichole Smith has been instructed to work up to a  goal of 150 minutes of combined cardio and strengthening exercise per week for weight loss and overall health benefits. We discussed the following Behavioral Modification Strategies today: increase H20 intake and travel eating strategies.  Nichole Smith has agreed to follow-up with our clinic in 3 weeks. She was informed of the importance of frequent follow-up visits to maximize her success with intensive lifestyle modifications for her multiple  health conditions.  ALLERGIES: Allergies  Allergen Reactions  . Neomycin Other (See Comments)    Inflammation--eye drop   . Ace Inhibitors Cough    MEDICATIONS: Current Outpatient Medications on File Prior to Visit  Medication Sig Dispense Refill  . aspirin EC 81 MG tablet Take 81 mg by mouth at bedtime.    [name] atorvastatin (LIPITOR) 40 MG tablet TAKE 1 TABLET DAILY 90 tablet 3  . CALCIUM PO Take 1 tablet by mouth at bedtime.     [name] diltiazem (CARDIZEM CD) 180 MG 24 hr capsule Take 1 capsule (180 mg total) by mouth at bedtime. 90 capsule 3  . estradiol (ESTRACE VAGINAL) 0.1 MG/GM vaginal cream INSERT 1 GM VAGINALLY TWICE WEEKLY 42.5 g 3  . hydrochlorothiazide (MICROZIDE) 12.5 MG capsule TAKE 1 CAPSULE DAILY.   SCHEDULE ANNUAL VISIT 90 capsule 4  . methocarbamol (ROBAXIN) 750 MG tablet     . Multiple Vitamins-Minerals (PRESERVISION AREDS 2 PO) Take 1 tablet by mouth 2 (two) times daily.    . naproxen sodium (ALEVE) 220 MG tablet Take 220 mg by mouth 2 (two) times daily as needed (for pain.).    [name] SYNTHROID 137 MCG tablet TAKE 1 TABLET DAILY 90 tablet 1  . TOPROL XL 50 MG 24 hr tablet TAKE 1 TABLET DAILY 90 tablet 3   No current facility-administered medications on file prior to visit.     PAST MEDICAL HISTORY: Past Medical History:  Diagnosis Date  . Arthritis    knees, back  . Cancer ([place])   . Carcinoid tumor of lung 2012   right found incidentally on chest xray eval for r/o vasxculitis   . Carpal tunnel syndrome, bilateral   . Coronary artery calcification 07/23/2016  . Double vision    history, no current problem  . Dysrhythmia    irregular occassional, no current problem  . Frequent UTI   . HNP (herniated nucleus pulposus), cervical 01/22/2011   C6-C7  Has seen dr Vertell Limber for this and Dr Tonita Cong  Positional numbness in hands no weakness    . Hypertension   . Hypothyroidism   . Infertility, female   . 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 Obstructive sleep apnea  07/27/2014   NPSG 07/2014:  AHI 85/hr, optimal cpap 10cm.  Download 08/2014:  Good compliance, breakthru apnea on 10cm.  Changed to auto 5-12cm >> good control of AHI on f/u download ONO on CPAP:      . Osteoarthritis   . PCOS (polycystic ovarian syndrome)   . SUI (stress urinary incontinence, female)    h/o    PAST SURGICAL HISTORY: Past Surgical History:  Procedure Laterality Date  . ANTERIOR CERVICAL DECOMP/DISCECTOMY FUSION  12/24/2017   Procedure: Cervical five-six Cervical six-seven Anterior cervical decompression/discectomy/fusion;  Surgeon: Erline Levine, MD;  Location: [facility];  Service: Neurosurgery;;  . Wilmon Pali RELEASE Left 12/24/2017   Procedure: LEFT CARPAL TUNNEL RELEASE;  Surgeon: Erline Levine, MD;  Location: [facility];  Service: Neurosurgery;  Laterality: Left;  . CESAREAN SECTION     x 1 - twins  . COLONOSCOPY    .  DILATATION & CURRETTAGE/HYSTEROSCOPY WITH RESECTOCOPE N/A 07/18/2013   Procedure: DILATATION & CURETTAGE/HYSTEROSCOPY WITH RESECTOCOPE;  Surgeon: Lyman Speller, MD;  Location: [facility];  Service: Gynecology;  Laterality: N/A;  mass resection  . Lung tumor removed     Rt lung carcinoid  . MOUTH SURGERY     pre-cancerous ulcer removed  . SHOULDER OPEN ROTATOR CUFF REPAIR  07/20/2010   Right  . WISDOM TOOTH EXTRACTION      SOCIAL HISTORY: Social History   Tobacco Use  . Smoking status: Never Smoker  . Smokeless tobacco: Never Used  Substance Use Topics  . Alcohol use: Not Currently  . Drug use: No    FAMILY HISTORY: Family History  Problem Relation Age of Onset  . Stroke Mother        died age 27  . Hypertension Mother   . Sudden death Mother   . Alcoholism Mother   . Coronary artery disease Father        died age 73  . Hypertension Father   . Liver disease Father   . Alcohol abuse Father   . Obesity Father   . Parkinson's disease Brother   . Diabetes Brother   . Hypertension Brother   . Hypertension Maternal Uncle   . Colon cancer  Neg Hx   . Rectal cancer Neg Hx   . Stomach cancer Neg Hx    ROS: Review of Systems  Gastrointestinal: Positive for diarrhea. Negative for nausea and vomiting.  Musculoskeletal:       Negative for muscle weakness.   PHYSICAL EXAM: Pt in no acute distress  RECENT LABS AND TESTS: BMET    Component Value Date/Time   NA 140 01/12/2019 1040   K 3.9 01/12/2019 1040   CL 98 01/12/2019 1040   CO2 27 01/12/2019 1040   GLUCOSE 108 (H) 01/12/2019 1040   GLUCOSE 100 (H) 06/07/2018 0739   BUN 22 01/12/2019 1040   CREATININE 0.64 01/12/2019 1040   CALCIUM 9.5 01/12/2019 1040   GFRNONAA 95 01/12/2019 1040   GFRAA 109 01/12/2019 1040   Lab Results  Component Value Date   HGBA1C 5.9 (H) 01/12/2019   HGBA1C 5.9 06/07/2018   Lab Results  Component Value Date   INSULIN 20.4 01/12/2019   CBC    Component Value Date/Time   WBC 7.8 01/12/2019 1040   WBC 7.3 06/07/2018 0739   RBC 5.24 01/12/2019 1040   RBC 4.86 06/07/2018 0739   HGB 16.3 (H) 01/12/2019 1040   HGB 15.4 03/13/2014 1422   HCT 47.8 (H) 01/12/2019 1040   HCT 45.3 03/13/2014 1422   PLT 228.0 06/07/2018 0739   PLT 245 03/13/2014 1422   MCV 91 01/12/2019 1040   MCV 89.0 03/13/2014 1422   MCH 31.1 01/12/2019 1040   MCH 31.3 12/21/2017 1049   MCHC 34.1 01/12/2019 1040   MCHC 34.3 06/07/2018 0739   RDW 12.0 01/12/2019 1040   RDW 13.2 03/13/2014 1422   LYMPHSABS 2.3 01/12/2019 1040   LYMPHSABS 3.2 03/13/2014 1422   MONOABS 0.6 06/07/2018 0739   MONOABS 0.7 03/13/2014 1422   EOSABS 0.3 01/12/2019 1040   BASOSABS 0.1 01/12/2019 1040   BASOSABS 0.0 03/13/2014 1422   Iron/TIBC/Ferritin/ %Sat No results found for: IRON, TIBC, FERRITIN, IRONPCTSAT Lipid Panel     Component Value Date/Time   CHOL 133 01/12/2019 1040   CHOL 110 11/03/2016 0000   TRIG 62 01/12/2019 1040   TRIG 54 11/03/2016 0000   HDL 57 01/12/2019 1040  HDL 48 (L) 11/03/2016 0000   CHOLHDL 2 06/07/2018 0739   VLDL 10.8 06/07/2018 0739   LDLCALC  64 01/12/2019 1040   LDLCALC 49 11/03/2016 0000   Hepatic Function Panel     Component Value Date/Time   PROT 7.3 01/12/2019 1040   ALBUMIN 4.2 01/12/2019 1040   AST 24 01/12/2019 1040   ALT 26 01/12/2019 1040   ALKPHOS 91 01/12/2019 1040   BILITOT 0.6 01/12/2019 1040   BILIDIR 0.1 06/07/2018 0739   IBILI 0.4 09/08/2016 0749      Component Value Date/Time   TSH 2.490 01/12/2019 1040   TSH 3.66 06/07/2018 0739   TSH 1.40 04/30/2017 0838   Results for SHAUNETTE, GASSNER (MRN [id number]) as of 06/21/2019 11:39  Ref. Range 01/12/2019 10:40  Vitamin D, 25-Hydroxy Latest Ref Range: 30.0 - 100.0 ng/mL 33.1   I, [name] Song, am acting as Location manager for Dennard Nip, MD I have reviewed the above documentation for accuracy and completeness, and I agree with the above. -Dennard Nip, MD

## 2019-07-11 ENCOUNTER — Ambulatory Visit (INDEPENDENT_AMBULATORY_CARE_PROVIDER_SITE_OTHER): Admitting: Family Medicine

## 2019-07-11 ENCOUNTER — Other Ambulatory Visit: Payer: Self-pay

## 2019-07-11 ENCOUNTER — Encounter (INDEPENDENT_AMBULATORY_CARE_PROVIDER_SITE_OTHER): Payer: Self-pay | Admitting: Family Medicine

## 2019-07-11 VITALS — BP 122/70 | HR 63 | Temp 98.1°F | Ht 59.0 in | Wt 219.0 lb

## 2019-07-11 DIAGNOSIS — Z9189 Other specified personal risk factors, not elsewhere classified: Secondary | ICD-10-CM

## 2019-07-11 DIAGNOSIS — R7303 Prediabetes: Secondary | ICD-10-CM | POA: Diagnosis not present

## 2019-07-11 DIAGNOSIS — Z6841 Body Mass Index (BMI) 40.0 and over, adult: Secondary | ICD-10-CM

## 2019-07-12 NOTE — Progress Notes (Signed)
Office: [phone]  /  Fax: [phone]   HPI:   Chief Complaint: OBESITY Nichole Smith is here to discuss her progress with her obesity treatment plan. She is keeping a food journal with 1500-1600 calories and 90+ grams of protein and is following her eating plan approximately 100% of the time. She states she is exercising in the pool/walking 45 minutes 4 times per week. Nichole Smith continues to do well with weight loss. She is disappointed it isn't much weight loss but is still glad she is in the right direction. She has questions about higher protein foods she can eat for snacks to help her meet her protein goals. Her weight is 219 lb (99.3 kg) today and has had a weight loss of 3 pounds over a period of 6 weeks since her last visit. She has lost 19 lbs since starting treatment with [name].  Pre-Diabetes Nichole Smith has a diagnosis of prediabetes based on her elevated Hgb A1c and was informed this puts her at greater risk of developing diabetes. Last A1c was elevated at 5.9 on 01/12/2019. Nichole Smith is doing well on her diet prescription and is working on decreasing simple carbs and trying to increase her activity.  She is taking metformin currently and continues to work on diet and exercise to decrease risk of diabetes.  At risk for diabetes Nichole Smith is at higher than average risk for developing diabetes due to her obesity. She currently denies polyuria or polydipsia.  ASSESSMENT AND PLAN:  Prediabetes  At risk for diabetes mellitus  Class 3 severe obesity with serious comorbidity and body mass index (BMI) of 40.0 to 44.9 in adult, unspecified obesity type [facility])  PLAN:  Pre-Diabetes Nichole Smith will continue to work on weight loss, exercise, and decreasing simple carbohydrates in her diet to help decrease the risk of diabetes. We dicussed metformin including benefits and risks. She was informed that eating too many simple carbohydrates or too many calories at one sitting increases the likelihood of GI side effects.  Nichole Smith will continue diet and exercise. She will have labs rechecked in 1 month.  Diabetes risk counseling Nichole Smith was given extended (15 minutes) diabetes prevention counseling today. She is 63 y.o. female and has risk factors for diabetes including obesity. We discussed intensive lifestyle modifications today with an emphasis on weight loss as well as increasing exercise and decreasing simple carbohydrates in her diet.  Obesity Nichole Smith is currently in the action stage of change. As such, her goal is to continue with weight loss efforts. She has agreed to keep a food journal with 1500-1600 calories and 90+ grams of protein daily. Nichole Smith has been instructed to work up to a goal of 150 minutes of combined cardio and strengthening exercise per week for weight loss and overall health benefits. We discussed the following Behavioral Modification Strategies today: increasing lean protein intake, decreasing simple carbohydrates, better snacking choices, and keep a strict food journal.  Nichole Smith has agreed to follow-up with our clinic in 3 weeks. She was informed of the importance of frequent follow-up visits to maximize her success with intensive lifestyle modifications for her multiple health conditions.  ALLERGIES: Allergies  Allergen Reactions  . Neomycin Other (See Comments)    Inflammation--eye drop   . Ace Inhibitors Cough    MEDICATIONS: Current Outpatient Medications on File Prior to Visit  Medication Sig Dispense Refill  . aspirin EC 81 MG tablet Take 81 mg by mouth at bedtime.    [name] atorvastatin (LIPITOR) 40 MG tablet TAKE 1 TABLET DAILY 90  tablet 3  . CALCIUM PO Take 1 tablet by mouth at bedtime.     [name] diltiazem (CARDIZEM CD) 180 MG 24 hr capsule Take 1 capsule (180 mg total) by mouth at bedtime. 90 capsule 3  . estradiol (ESTRACE VAGINAL) 0.1 MG/GM vaginal cream INSERT 1 GM VAGINALLY TWICE WEEKLY 42.5 g 3  . hydrochlorothiazide (MICROZIDE) 12.5 MG capsule TAKE 1 CAPSULE DAILY.   SCHEDULE  ANNUAL VISIT 90 capsule 4  . metFORMIN (GLUCOPHAGE) 500 MG tablet Take 1 tablet (500 mg total) by mouth 2 (two) times daily with a meal. 60 tablet 0  . methocarbamol (ROBAXIN) 750 MG tablet     . Multiple Vitamins-Minerals (PRESERVISION AREDS 2 PO) Take 1 tablet by mouth 2 (two) times daily.    . naproxen sodium (ALEVE) 220 MG tablet Take 220 mg by mouth 2 (two) times daily as needed (for pain.).    [name] SYNTHROID 137 MCG tablet TAKE 1 TABLET DAILY 90 tablet 1  . TOPROL XL 50 MG 24 hr tablet TAKE 1 TABLET DAILY 90 tablet 3  . Vitamin D, Ergocalciferol, (DRISDOL) 1.25 MG (50000 UT) CAPS capsule Take 1 capsule (50,000 Units total) by mouth every 7 (seven) days. 13 capsule 0   No current facility-administered medications on file prior to visit.     PAST MEDICAL HISTORY: Past Medical History:  Diagnosis Date  . Arthritis    knees, back  . Cancer ([place])   . Carcinoid tumor of lung 2012   right found incidentally on chest xray eval for r/o vasxculitis   . Carpal tunnel syndrome, bilateral   . Coronary artery calcification 07/23/2016  . Double vision    history, no current problem  . Dysrhythmia    irregular occassional, no current problem  . Frequent UTI   . HNP (herniated nucleus pulposus), cervical 01/22/2011   C6-C7  Has seen dr Vertell Limber for this and Dr Tonita Cong  Positional numbness in hands no weakness    . Hypertension   . Hypothyroidism   . Infertility, female   . 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 Obstructive sleep apnea 07/27/2014   NPSG 07/2014:  AHI 85/hr, optimal cpap 10cm.  Download 08/2014:  Good compliance, breakthru apnea on 10cm.  Changed to auto 5-12cm >> good control of AHI on f/u download ONO on CPAP:      . Osteoarthritis   . PCOS (polycystic ovarian syndrome)   . SUI (stress urinary incontinence, female)    h/o    PAST SURGICAL HISTORY: Past Surgical History:  Procedure Laterality Date  . ANTERIOR CERVICAL DECOMP/DISCECTOMY FUSION  12/24/2017   Procedure:  Cervical five-six Cervical six-seven Anterior cervical decompression/discectomy/fusion;  Surgeon: Erline Levine, MD;  Location: [facility];  Service: Neurosurgery;;  . Wilmon Pali RELEASE Left 12/24/2017   Procedure: LEFT CARPAL TUNNEL RELEASE;  Surgeon: Erline Levine, MD;  Location: [facility];  Service: Neurosurgery;  Laterality: Left;  . CESAREAN SECTION     x 1 - twins  . COLONOSCOPY    . DILATATION & CURRETTAGE/HYSTEROSCOPY WITH RESECTOCOPE N/A 07/18/2013   Procedure: DILATATION & CURETTAGE/HYSTEROSCOPY WITH RESECTOCOPE;  Surgeon: Lyman Speller, MD;  Location: [facility];  Service: Gynecology;  Laterality: N/A;  mass resection  . Lung tumor removed     Rt lung carcinoid  . MOUTH SURGERY     pre-cancerous ulcer removed  . SHOULDER OPEN ROTATOR CUFF REPAIR  07/20/2010   Right  . WISDOM TOOTH EXTRACTION      SOCIAL HISTORY: Social History  Tobacco Use  . Smoking status: Never Smoker  . Smokeless tobacco: Never Used  Substance Use Topics  . Alcohol use: Not Currently  . Drug use: No    FAMILY HISTORY: Family History  Problem Relation Age of Onset  . Stroke Mother        died age 22  . Hypertension Mother   . Sudden death Mother   . Alcoholism Mother   . Coronary artery disease Father        died age 66  . Hypertension Father   . Liver disease Father   . Alcohol abuse Father   . Obesity Father   . Parkinson's disease Brother   . Diabetes Brother   . Hypertension Brother   . Hypertension Maternal Uncle   . Colon cancer Neg Hx   . Rectal cancer Neg Hx   . Stomach cancer Neg Hx    ROS: ROS none noted.  PHYSICAL EXAM: Blood pressure 122/70, pulse 63, temperature 98.1 F (36.7 C), temperature source Oral, height 4\' 11"  (1.499 m), weight 219 lb (99.3 kg), last menstrual period 02/12/2011, SpO2 94 %. Body mass index is 44.23 kg/m. Physical Exam Vitals signs reviewed.  Constitutional:      Appearance: Normal appearance. She is obese.  Cardiovascular:     Rate and  Rhythm: Normal rate.     Pulses: Normal pulses.  Pulmonary:     Effort: Pulmonary effort is normal.     Breath sounds: Normal breath sounds.  Musculoskeletal: Normal range of motion.  Skin:    General: Skin is warm and dry.  Neurological:     Mental Status: She is alert and oriented to person, place, and time.  Psychiatric:        Behavior: Behavior normal.   RECENT LABS AND TESTS: BMET    Component Value Date/Time   NA 140 01/12/2019 1040   K 3.9 01/12/2019 1040   CL 98 01/12/2019 1040   CO2 27 01/12/2019 1040   GLUCOSE 108 (H) 01/12/2019 1040   GLUCOSE 100 (H) 06/07/2018 0739   BUN 22 01/12/2019 1040   CREATININE 0.64 01/12/2019 1040   CALCIUM 9.5 01/12/2019 1040   GFRNONAA 95 01/12/2019 1040   GFRAA 109 01/12/2019 1040   Lab Results  Component Value Date   HGBA1C 5.9 (H) 01/12/2019   HGBA1C 5.9 06/07/2018   Lab Results  Component Value Date   INSULIN 20.4 01/12/2019   CBC    Component Value Date/Time   WBC 7.8 01/12/2019 1040   WBC 7.3 06/07/2018 0739   RBC 5.24 01/12/2019 1040   RBC 4.86 06/07/2018 0739   HGB 16.3 (H) 01/12/2019 1040   HGB 15.4 03/13/2014 1422   HCT 47.8 (H) 01/12/2019 1040   HCT 45.3 03/13/2014 1422   PLT 228.0 06/07/2018 0739   PLT 245 03/13/2014 1422   MCV 91 01/12/2019 1040   MCV 89.0 03/13/2014 1422   MCH 31.1 01/12/2019 1040   MCH 31.3 12/21/2017 1049   MCHC 34.1 01/12/2019 1040   MCHC 34.3 06/07/2018 0739   RDW 12.0 01/12/2019 1040   RDW 13.2 03/13/2014 1422   LYMPHSABS 2.3 01/12/2019 1040   LYMPHSABS 3.2 03/13/2014 1422   MONOABS 0.6 06/07/2018 0739   MONOABS 0.7 03/13/2014 1422   EOSABS 0.3 01/12/2019 1040   BASOSABS 0.1 01/12/2019 1040   BASOSABS 0.0 03/13/2014 1422   Iron/TIBC/Ferritin/ %Sat No results found for: IRON, TIBC, FERRITIN, IRONPCTSAT Lipid Panel     Component Value Date/Time   CHOL  133 01/12/2019 1040   CHOL 110 11/03/2016 0000   TRIG 62 01/12/2019 1040   TRIG 54 11/03/2016 0000   HDL 57 01/12/2019  1040   HDL 48 (L) 11/03/2016 0000   CHOLHDL 2 06/07/2018 0739   VLDL 10.8 06/07/2018 0739   LDLCALC 64 01/12/2019 1040   LDLCALC 49 11/03/2016 0000   Hepatic Function Panel     Component Value Date/Time   PROT 7.3 01/12/2019 1040   ALBUMIN 4.2 01/12/2019 1040   AST 24 01/12/2019 1040   ALT 26 01/12/2019 1040   ALKPHOS 91 01/12/2019 1040   BILITOT 0.6 01/12/2019 1040   BILIDIR 0.1 06/07/2018 0739   IBILI 0.4 09/08/2016 0749      Component Value Date/Time   TSH 2.490 01/12/2019 1040   TSH 3.66 06/07/2018 0739   TSH 1.40 04/30/2017 0838   Results for JERONICA, STLOUIS (MRN [id number]) as of 07/12/2019 16:17  Ref. Range 01/12/2019 10:40  Vitamin D, 25-Hydroxy Latest Ref Range: 30.0 - 100.0 ng/mL 33.1   OBESITY BEHAVIORAL INTERVENTION VISIT  Today's visit was #11  Starting weight: 238 lbs Starting date: 01/12/2019 Today's weight: 219 lbs  Today's date: 07/11/2019 Total lbs lost to date: 19    07/11/2019  Height 4\' 11"  (1.499 m)  Weight 219 lb (99.3 kg)  BMI (Calculated) 44.21  BLOOD PRESSURE - SYSTOLIC 854  BLOOD PRESSURE - DIASTOLIC 70   Body Fat % 62.7 %  Total Body Water (lbs) 78.6 lbs   ASK: We discussed the diagnosis of obesity with Nichole Smith today and Nichole Smith agreed to give [name] permission to discuss obesity behavioral modification therapy today.  ASSESS: Nichole Smith has the diagnosis of obesity and her BMI today is 44.2. Nichole Smith is in the action stage of change.   ADVISE: Nichole Smith was educated on the multiple health risks of obesity as well as the benefit of weight loss to improve her health. She was advised of the need for long term treatment and the importance of lifestyle modifications to improve her current health and to decrease her risk of future health problems.  AGREE: Multiple dietary modification options and treatment options were discussed and  Nichole Smith agreed to follow the recommendations documented in the above note.  ARRANGE: Nichole Smith was educated on the  importance of frequent visits to treat obesity as outlined per CMS and USPSTF guidelines and agreed to schedule her next follow up appointment today.  I, [name] Song, am acting as Location manager for Dennard Nip, MD  I have reviewed the above documentation for accuracy and completeness, and I agree with the above. -Dennard Nip, MD

## 2019-07-13 ENCOUNTER — Other Ambulatory Visit: Payer: Self-pay

## 2019-07-13 ENCOUNTER — Encounter: Payer: Self-pay | Admitting: Pulmonary Disease

## 2019-07-13 ENCOUNTER — Ambulatory Visit (INDEPENDENT_AMBULATORY_CARE_PROVIDER_SITE_OTHER): Admitting: Pulmonary Disease

## 2019-07-13 DIAGNOSIS — G4709 Other insomnia: Secondary | ICD-10-CM

## 2019-07-13 DIAGNOSIS — G4733 Obstructive sleep apnea (adult) (pediatric): Secondary | ICD-10-CM

## 2019-07-13 NOTE — Patient Instructions (Signed)
Call or email if you find out you can get a new CPAP machine now  Try avoiding rest periods during the day  Try to limit your time in bed  Follow up in 1 year

## 2019-07-13 NOTE — Progress Notes (Signed)
Nichole Smith, Nichole Smith, Nichole Smith  Chief Complaint  Patient presents with  . Sleep Apnea    Constitutional:  LMP 02/12/2011   Deferred  Past Medical History:  Urine incontinence, PCOS, OA, Leukocytoclastic vasculitis, Hypothyroidism, HTN, Coronary calcification, Carcinoid tumor of lung 2012  Brief Summary:  Nichole Smith is a 63 y.o. female with obstructive sleep apnea.  Virtual Visit via Telephone Note  I connected with Nichole Smith on 07/13/19 at 10:15 AM EDT by telephone Nichole verified that I am speaking with the correct person using two identifiers.  Location: Patient: home  Provider: medical office   I discussed the limitations, risks, security Nichole privacy concerns of performing an evaluation Nichole management service by telephone Nichole the availability of in person appointments. I also discussed with the patient that there may be a patient responsible charge related to this service. The patient expressed understanding Nichole agreed to proceed.  Uses CPAP nightly.  No issues with mask fit.  Goes to bed at 1030 pm.  Wakes up at 6 am.  Gets up several times to use the bathroom, then has trouble falling asleep.  She is enrolled in weight management program with [facility].  She is eating more often because she is walking up too early.  She was seen by urology for bladder issues.     Physical Exam:   Deferred  Assessment/Plan:   Obstructive sleep apnea. - she is compliant with CPAP Nichole reports benefit - continue CPAP 16 cm H2O  Sleep maintenance insomnia. - mostly related to nocturia >> f/u with urology - also discussed sleep restriction  Obesity. - enrolled in [facility] weight management program   Patient Instructions  Call or email if you find out you can get a new CPAP machine now  Try avoiding rest periods during the day  Try to limit your time in bed  Follow up in 1 year   I discussed the assessment Nichole treatment plan with the patient. The  patient was provided an opportunity to ask questions Nichole all were answered. The patient agreed with the plan Nichole demonstrated an understanding of the instructions.   The patient was advised to call back or seek an in-person evaluation if the symptoms worsen or if the condition fails to improve as anticipated.  I provided 22 minutes of non-face-to-face time during this encounter.  Chesley Mires, MD Kennedy Smith/Nichole Smith Pager: [phone] 07/13/2019, 11:01 AM  Flow Sheet      Sleep tests:  PSG 07/10/14 >> AHI 85 ONO with CPAP 04/12/18 >>test time 6 hrs 34 min. Average SpO2 90%, low SpO2 82%. Spent 43.5 min with SpO2 <88%. ONO with CPAP 06/08/18 >> test time 8 hrs 42 min.  Basal SpO2 93.6%, low SpO2 81%.  Spent 4.3 min with SpO2 < 88%. CPAP 06/12/19 to 07/11/19 >> used on 30 of 30 nights with average 7 hrs 43 min.  Average AHI 0.4 with CPAP 16 cm H2O  Cardiac tests:  Echo 05/26/14 >> EF 65 to 70%  Medications:   Allergies as of 07/13/2019      Reactions   Neomycin Other (See Comments)   Inflammation--eye drop   Ace Inhibitors Cough      Medication List       Accurate as of July 13, 2019 11:01 AM. If you have any questions, ask your nurse or doctor.        aspirin EC 81 MG tablet Take 81 mg by mouth at bedtime.   atorvastatin  40 MG tablet Commonly known as: LIPITOR TAKE 1 TABLET DAILY   CALCIUM PO Take 1 tablet by mouth at bedtime.   diltiazem 180 MG 24 hr capsule Commonly known as: CARDIZEM CD Take 1 capsule (180 mg total) by mouth at bedtime.   estradiol 0.1 MG/GM vaginal cream Commonly known as: ESTRACE VAGINAL INSERT 1 GM VAGINALLY TWICE WEEKLY   hydrochlorothiazide 12.5 MG capsule Commonly known as: MICROZIDE TAKE 1 CAPSULE DAILY.   SCHEDULE ANNUAL VISIT   metFORMIN 500 MG tablet Commonly known as: GLUCOPHAGE Take 1 tablet (500 mg total) by mouth 2 (two) times daily with a meal.   methocarbamol 750 MG tablet Commonly known as: ROBAXIN    naproxen sodium 220 MG tablet Commonly known as: ALEVE Take 220 mg by mouth 2 (two) times daily as needed (for pain.).   PRESERVISION AREDS 2 PO Take 1 tablet by mouth 2 (two) times daily.   Synthroid 137 MCG tablet Generic drug: levothyroxine TAKE 1 TABLET DAILY   Toprol XL 50 MG 24 hr tablet Generic drug: metoprolol succinate TAKE 1 TABLET DAILY   Vitamin D (Ergocalciferol) 1.25 MG (50000 UT) Caps capsule Commonly known as: DRISDOL Take 1 capsule (50,000 Units total) by mouth every 7 (seven) days.       Past Surgical History:  She  has a past surgical history that includes Cesarean section; Shoulder open rotator cuff repair (07/20/2010); Lung tumor removed; Dilatation & currettage/hysteroscopy with resectoscope (N/A, 07/18/2013); Wisdom tooth extraction; Mouth surgery; Colonoscopy; Anterior cervical decomp/discectomy fusion (12/24/2017); Nichole Carpal tunnel release (Left, 12/24/2017).  Family History:  Her family history includes Alcohol abuse in her father; Alcoholism in her mother; Coronary artery disease in her father; Diabetes in her brother; Hypertension in her brother, father, maternal uncle, Nichole mother; Liver disease in her father; Obesity in her father; Parkinson's disease in her brother; Stroke in her mother; Sudden death in her mother.  Social History:  She  reports that she has never smoked. She has never used smokeless tobacco. She reports previous alcohol use. She reports that she does not use drugs.

## 2019-07-18 DIAGNOSIS — D3A090 Benign carcinoid tumor of the bronchus and lung: Secondary | ICD-10-CM

## 2019-07-20 NOTE — Telephone Encounter (Signed)
Please arrange chest x ray order at location of patient choice  Dx   Carcinoid  Tumor of lung

## 2019-08-03 ENCOUNTER — Telehealth (INDEPENDENT_AMBULATORY_CARE_PROVIDER_SITE_OTHER): Payer: Medicare Other | Admitting: Family Medicine

## 2019-08-03 ENCOUNTER — Other Ambulatory Visit: Payer: Self-pay

## 2019-08-03 ENCOUNTER — Encounter (INDEPENDENT_AMBULATORY_CARE_PROVIDER_SITE_OTHER): Payer: Self-pay | Admitting: Family Medicine

## 2019-08-03 DIAGNOSIS — Z6841 Body Mass Index (BMI) 40.0 and over, adult: Secondary | ICD-10-CM | POA: Diagnosis not present

## 2019-08-03 DIAGNOSIS — I10 Essential (primary) hypertension: Secondary | ICD-10-CM

## 2019-08-03 DIAGNOSIS — R7303 Prediabetes: Secondary | ICD-10-CM

## 2019-08-03 MED ORDER — METFORMIN HCL 500 MG PO TABS: 500.0000 mg | ORAL_TABLET | Freq: Two times a day (BID) | ORAL | 0 refills | Status: DC

## 2019-08-04 NOTE — Progress Notes (Signed)
Office: [phone]  /  Fax: [phone] TeleHealth Visit:  Nichole Smith has verbally consented to this TeleHealth visit today. The patient is located at home, the provider is located at the [facility]. The participants in this visit include the listed provider and patient. The visit was conducted today via FaceTime.  HPI:   Chief Complaint: OBESITY Nichole Smith is here to discuss her progress with her obesity treatment plan. She is keeping a food journal with 1500-1600 calories and 90 grams of protein and is following her eating plan approximately 50% of the time. She states she is exercising 0 minutes 0 times per week. Nichole Smith is in the process of selling her house and hasn't been able to meal plan as well. She has increased eating out, but is still mindful of her food choices and is still trying to journal. She feels she has maintained her weight. We were unable to weigh the patient today for this TeleHealth visit. She feels as if she has maintained her weight since her last visit. She has lost 19 lbs since starting treatment with [name].  Pre-Diabetes Nichole Smith has a diagnosis of prediabetes based on her elevated Hgb A1c and was informed this puts her at greater risk of developing diabetes. She is stable on metformin currently and continues to work on diet and exercise to decrease risk of diabetes. She denies nausea, vomiting, or hypoglycemia.  Hypertension [name] is a 63 y.o. female with hypertension and has been stable on medications in the past. [name] denies chest pain, headache, or dizziness. She is working weight loss to help control her blood pressure with the goal of decreasing her risk of heart attack and stroke. [name] does not check her blood pressure at home.   ASSESSMENT AND PLAN:  Prediabetes - Plan: metFORMIN (GLUCOPHAGE) 500 MG tablet  Essential hypertension  Class 3 severe obesity with serious comorbidity and body mass index (BMI) of 45.0  to 49.9 in adult, unspecified obesity type ([place])  PLAN:  Pre-Diabetes Nichole Smith will continue to work on weight loss, exercise, and decreasing simple carbohydrates in her diet to help decrease the risk of diabetes. We dicussed metformin including benefits and risks. She was informed that eating too many simple carbohydrates or too many calories at one sitting increases the likelihood of GI side effects. Nichole Smith was given a refill on her metformin 500 mg #60 with 0 refills and agrees to follow-up with our clinic in 2-3 weeks. She will have labs checked at her next visit.  Hypertension We discussed sodium restriction, working on healthy weight loss, and a regular exercise program as the means to achieve improved blood pressure control. Nichole Smith agreed with this plan and agreed to follow up as directed. We will continue to monitor her blood pressure as well as her progress with the above lifestyle modifications. She will continue her diet, exercise, and medications as prescribed and will watch for signs of hypotension as she continues her lifestyle modifications. Will her check blood pressure at her next in-office visit.  Obesity Nichole Smith is currently in the action stage of change. As such, her goal is to maintain her weight for now. She has agreed to keep a food journal with 1500-1600 calories and 90 grams of protein. Nichole Smith has been instructed to work up to a goal of 150 minutes of combined cardio and strengthening exercise per week for weight loss and overall health benefits. We discussed the following Behavioral Modification Strategies today: increasing lean protein  intake and increase H20 intake.  Nichole Smith has agreed to follow-up with our clinic in 2-3 weeks. She was informed of the importance of frequent follow-up visits to maximize her success with intensive lifestyle modifications for her multiple health conditions.  ALLERGIES: Allergies  Allergen Reactions  . Neomycin Other (See Comments)     Inflammation--eye drop   . Ace Inhibitors Cough    MEDICATIONS: Current Outpatient Medications on File Prior to Visit  Medication Sig Dispense Refill  . aspirin EC 81 MG tablet Take 81 mg by mouth at bedtime.    Nichole Smith atorvastatin (LIPITOR) 40 MG tablet TAKE 1 TABLET DAILY 90 tablet 3  . CALCIUM PO Take 1 tablet by mouth at bedtime.     Nichole Smith diltiazem (CARDIZEM CD) 180 MG 24 hr capsule Take 1 capsule (180 mg total) by mouth at bedtime. 90 capsule 3  . estradiol (ESTRACE VAGINAL) 0.1 MG/GM vaginal cream INSERT 1 GM VAGINALLY TWICE WEEKLY 42.5 g 3  . hydrochlorothiazide (MICROZIDE) 12.5 MG capsule TAKE 1 CAPSULE DAILY.   SCHEDULE ANNUAL VISIT 90 capsule 4  . methocarbamol (ROBAXIN) 750 MG tablet     . Multiple Vitamins-Minerals (PRESERVISION AREDS 2 PO) Take 1 tablet by mouth 2 (two) times daily.    . naproxen sodium (ALEVE) 220 MG tablet Take 220 mg by mouth 2 (two) times daily as needed (for pain.).    Nichole Smith SYNTHROID 137 MCG tablet TAKE 1 TABLET DAILY 90 tablet 1  . TOPROL XL 50 MG 24 hr tablet TAKE 1 TABLET DAILY 90 tablet 3  . Vitamin D, Ergocalciferol, (DRISDOL) 1.25 MG (50000 UT) CAPS capsule Take 1 capsule (50,000 Units total) by mouth every 7 (seven) days. 13 capsule 0   No current facility-administered medications on file prior to visit.     PAST MEDICAL HISTORY: Past Medical History:  Diagnosis Date  . Arthritis    knees, back  . Cancer ([place])   . Carcinoid tumor of lung 2012   right found incidentally on chest xray eval for r/o vasxculitis   . Carpal tunnel syndrome, bilateral   . Coronary artery calcification 07/23/2016  . Double vision    history, no current problem  . Dysrhythmia    irregular occassional, no current problem  . Frequent UTI   . HNP (herniated nucleus pulposus), cervical 01/22/2011   C6-C7  Has seen dr Vertell Limber for this and Dr Tonita Cong  Positional numbness in hands no weakness    . Hypertension   . Hypothyroidism   . Infertility, female   . Leukocytoclastic  vasculitis ([place]) 05/26/2011  . Obstructive sleep apnea 07/27/2014   NPSG 07/2014:  AHI 85/hr, optimal cpap 10cm.  Download 08/2014:  Good compliance, breakthru apnea on 10cm.  Changed to auto 5-12cm >> good control of AHI on f/u download ONO on CPAP:      . Osteoarthritis   . PCOS (polycystic ovarian syndrome)   . SUI (stress urinary incontinence, female)    h/o    PAST SURGICAL HISTORY: Past Surgical History:  Procedure Laterality Date  . ANTERIOR CERVICAL DECOMP/DISCECTOMY FUSION  12/24/2017   Procedure: Cervical five-six Cervical six-seven Anterior cervical decompression/discectomy/fusion;  Surgeon: Erline Levine, MD;  Location: [facility];  Service: Neurosurgery;;  . Wilmon Pali RELEASE Left 12/24/2017   Procedure: LEFT CARPAL TUNNEL RELEASE;  Surgeon: Erline Levine, MD;  Location: [facility];  Service: Neurosurgery;  Laterality: Left;  . CESAREAN SECTION     x 1 - twins  . COLONOSCOPY    . DILATATION &  CURRETTAGE/HYSTEROSCOPY WITH RESECTOCOPE N/A 07/18/2013   Procedure: DILATATION & CURETTAGE/HYSTEROSCOPY WITH RESECTOCOPE;  Surgeon: Lyman Speller, MD;  Location: [facility];  Service: Gynecology;  Laterality: N/A;  mass resection  . Lung tumor removed     Rt lung carcinoid  . MOUTH SURGERY     pre-cancerous ulcer removed  . SHOULDER OPEN ROTATOR CUFF REPAIR  07/20/2010   Right  . WISDOM TOOTH EXTRACTION      SOCIAL HISTORY: Social History   Tobacco Use  . Smoking status: Never Smoker  . Smokeless tobacco: Never Used  Substance Use Topics  . Alcohol use: Not Currently  . Drug use: No    FAMILY HISTORY: Family History  Problem Relation Age of Onset  . Stroke Mother        died age 52  . Hypertension Mother   . Sudden death Mother   . Alcoholism Mother   . Coronary artery disease Father        died age 44  . Hypertension Father   . Liver disease Father   . Alcohol abuse Father   . Obesity Father   . Parkinson's disease Brother   . Diabetes Brother   . Hypertension  Brother   . Hypertension Maternal Uncle   . Colon cancer Neg Hx   . Rectal cancer Neg Hx   . Stomach cancer Neg Hx    ROS: Review of Systems  Cardiovascular: Negative for chest pain.  Gastrointestinal: Negative for nausea and vomiting.  Neurological: Negative for dizziness and headaches.  Endo/Heme/Allergies:       Negative for hypoglycemia.   PHYSICAL EXAM: Pt in no acute distress  RECENT LABS AND TESTS: BMET    Component Value Date/Time   NA 140 01/12/2019 1040   K 3.9 01/12/2019 1040   CL 98 01/12/2019 1040   CO2 27 01/12/2019 1040   GLUCOSE 108 (H) 01/12/2019 1040   GLUCOSE 100 (H) 06/07/2018 0739   BUN 22 01/12/2019 1040   CREATININE 0.64 01/12/2019 1040   CALCIUM 9.5 01/12/2019 1040   GFRNONAA 95 01/12/2019 1040   GFRAA 109 01/12/2019 1040   Lab Results  Component Value Date   HGBA1C 5.9 (H) 01/12/2019   HGBA1C 5.9 06/07/2018   Lab Results  Component Value Date   INSULIN 20.4 01/12/2019   CBC    Component Value Date/Time   WBC 7.8 01/12/2019 1040   WBC 7.3 06/07/2018 0739   RBC 5.24 01/12/2019 1040   RBC 4.86 06/07/2018 0739   HGB 16.3 (H) 01/12/2019 1040   HGB 15.4 03/13/2014 1422   HCT 47.8 (H) 01/12/2019 1040   HCT 45.3 03/13/2014 1422   PLT 228.0 06/07/2018 0739   PLT 245 03/13/2014 1422   MCV 91 01/12/2019 1040   MCV 89.0 03/13/2014 1422   MCH 31.1 01/12/2019 1040   MCH 31.3 12/21/2017 1049   MCHC 34.1 01/12/2019 1040   MCHC 34.3 06/07/2018 0739   RDW 12.0 01/12/2019 1040   RDW 13.2 03/13/2014 1422   LYMPHSABS 2.3 01/12/2019 1040   LYMPHSABS 3.2 03/13/2014 1422   MONOABS 0.6 06/07/2018 0739   MONOABS 0.7 03/13/2014 1422   EOSABS 0.3 01/12/2019 1040   BASOSABS 0.1 01/12/2019 1040   BASOSABS 0.0 03/13/2014 1422   Iron/TIBC/Ferritin/ %Sat No results found for: IRON, TIBC, FERRITIN, IRONPCTSAT Lipid Panel     Component Value Date/Time   CHOL 133 01/12/2019 1040   CHOL 110 11/03/2016 0000   TRIG 62 01/12/2019 1040   TRIG 54  11/03/2016 0000   HDL 57 01/12/2019 1040   HDL 48 (L) 11/03/2016 0000   CHOLHDL 2 06/07/2018 0739   VLDL 10.8 06/07/2018 0739   LDLCALC 64 01/12/2019 1040   LDLCALC 49 11/03/2016 0000   Hepatic Function Panel     Component Value Date/Time   PROT 7.3 01/12/2019 1040   ALBUMIN 4.2 01/12/2019 1040   AST 24 01/12/2019 1040   ALT 26 01/12/2019 1040   ALKPHOS 91 01/12/2019 1040   BILITOT 0.6 01/12/2019 1040   BILIDIR 0.1 06/07/2018 0739   IBILI 0.4 09/08/2016 0749      Component Value Date/Time   TSH 2.490 01/12/2019 1040   TSH 3.66 06/07/2018 0739   TSH 1.40 04/30/2017 0838   Results for SENAYA, DICENSO (MRN [id number]) as of 08/04/2019 14:18  Ref. Range 01/12/2019 10:40  Vitamin D, 25-Hydroxy Latest Ref Range: 30.0 - 100.0 ng/mL 33.1   I, [name] Song, am acting as Location manager for Dennard Nip, MD I have reviewed the above documentation for accuracy and completeness, and I agree with the above. -Dennard Nip, MD

## 2019-08-05 ENCOUNTER — Ambulatory Visit
Admission: RE | Admit: 2019-08-05 | Discharge: 2019-08-05 | Disposition: A | Discharge status: Home / Self Care | Payer: Medicare Other | Source: Ambulatory Visit | Attending: Internal Medicine | Admitting: Internal Medicine

## 2019-08-05 DIAGNOSIS — D3A090 Benign carcinoid tumor of the bronchus and lung: Secondary | ICD-10-CM

## 2019-08-15 LAB — HM MAMMOGRAPHY

## 2019-08-22 ENCOUNTER — Ambulatory Visit (INDEPENDENT_AMBULATORY_CARE_PROVIDER_SITE_OTHER): Payer: Medicare Other | Admitting: Family Medicine

## 2019-08-22 ENCOUNTER — Other Ambulatory Visit: Payer: Self-pay

## 2019-08-22 VITALS — BP 118/77 | HR 60 | Temp 98.0°F | Ht 59.0 in | Wt 216.6 lb

## 2019-08-22 DIAGNOSIS — E7849 Other hyperlipidemia: Secondary | ICD-10-CM | POA: Diagnosis not present

## 2019-08-22 DIAGNOSIS — E559 Vitamin D deficiency, unspecified: Secondary | ICD-10-CM | POA: Diagnosis not present

## 2019-08-22 DIAGNOSIS — E038 Other specified hypothyroidism: Secondary | ICD-10-CM | POA: Diagnosis not present

## 2019-08-22 DIAGNOSIS — Z6841 Body Mass Index (BMI) 40.0 and over, adult: Secondary | ICD-10-CM | POA: Diagnosis not present

## 2019-08-22 DIAGNOSIS — R7303 Prediabetes: Secondary | ICD-10-CM

## 2019-08-22 MED ORDER — METFORMIN HCL 500 MG PO TABS: 500.0000 mg | ORAL_TABLET | Freq: Two times a day (BID) | ORAL | 0 refills | Status: DC

## 2019-08-23 LAB — T3: T3, Total: 113 ng/dL (ref 71–180)

## 2019-08-23 LAB — COMPREHENSIVE METABOLIC PANEL
ALT: 25 IU/L (ref 0–32)
AST: 21 IU/L (ref 0–40)
Albumin/Globulin Ratio: 1.3 (ref 1.2–2.2)
Albumin: 4 g/dL (ref 3.8–4.8)
Alkaline Phosphatase: 81 IU/L (ref 39–117)
BUN/Creatinine Ratio: 34 — ABNORMAL HIGH (ref 12–28)
BUN: 23 mg/dL (ref 8–27)
Bilirubin Total: 0.5 mg/dL (ref 0.0–1.2)
CO2: 26 mmol/L (ref 20–29)
Calcium: 9.2 mg/dL (ref 8.7–10.3)
Chloride: 102 mmol/L (ref 96–106)
Creatinine, Ser: 0.68 mg/dL (ref 0.57–1.00)
GFR calc Af Amer: 107 mL/min/{1.73_m2} (ref >59)
GFR calc non Af Amer: 93 mL/min/{1.73_m2} (ref >59)
Globulin, Total: 3.1 g/dL (ref 1.5–4.5)
Glucose: 106 mg/dL — ABNORMAL HIGH (ref 65–99)
Potassium: 4.3 mmol/L (ref 3.5–5.2)
Sodium: 142 mmol/L (ref 134–144)
Total Protein: 7.1 g/dL (ref 6.0–8.5)

## 2019-08-23 LAB — HEMOGLOBIN A1C
Est. average glucose Bld gHb Est-mCnc: 114 mg/dL
Hgb A1c MFr Bld: 5.6 % (ref 4.8–5.6)

## 2019-08-23 LAB — INSULIN, RANDOM: INSULIN: 21.5 u[IU]/mL (ref 2.6–24.9)

## 2019-08-23 LAB — LIPID PANEL WITH LDL/HDL RATIO
Cholesterol, Total: 130 mg/dL (ref 100–199)
HDL: 60 mg/dL (ref >39)
LDL Chol Calc (NIH): 58 mg/dL (ref 0–99)
LDL/HDL Ratio: 1 ratio (ref 0.0–3.2)
Triglycerides: 55 mg/dL (ref 0–149)
VLDL Cholesterol Cal: 12 mg/dL (ref 5–40)

## 2019-08-23 LAB — VITAMIN D 25 HYDROXY (VIT D DEFICIENCY, FRACTURES): Vit D, 25-Hydroxy: 58 ng/mL (ref 30.0–100.0)

## 2019-08-23 LAB — TSH: TSH: 1.22 u[IU]/mL (ref 0.450–4.500)

## 2019-08-23 LAB — T4, FREE: Free T4: 1.63 ng/dL (ref 0.82–1.77)

## 2019-08-23 NOTE — Progress Notes (Signed)
Office: [phone]  /  Fax: [phone]   HPI:   Chief Complaint: OBESITY Nichole Smith is here to discuss her progress with her obesity treatment plan. She is on the keep a food journal with 1500 calories and 90 grams of protein daily plan and she is following her eating plan approximately 40 % of the time. She states she is swimming and walking 20 minutes 7 times per week. Cathye has been off her plan for a few weeks due to packing to move. She recently bought a new house. She has not been journaling or meal planning well due to life circumstances. Her weight is 216 lb 9.6 oz (98.2 kg) today and has had a weight loss of 3 pounds since her last in-office visit. She has lost 22 lbs since starting treatment with [name].  Pre-Diabetes Verlon has a diagnosis of prediabetes based on her elevated Hgb A1c and was informed this puts her at greater risk of developing diabetes. Ayshia is on metformin two times daily. She is unsure if it helps. Narda often forgets her lunch dose. Her last A1c was at 5.9 on 01/12/19. Johnica continues to work on diet and exercise to decrease risk of diabetes. She denies nausea or hypoglycemia.  Vitamin D deficiency Sejal has a diagnosis of vitamin D deficiency. Her last level was not at goal as of 01/12/19 and was 33.1 Laketa is currently taking vit D and she denies nausea, vomiting or muscle weakness.  Hyperlipidemia Tamme has hyperlipidemia and she is on statin. She has been trying to improve her cholesterol levels with intensive lifestyle modification including a low saturated fat diet, exercise and weight loss. She denies any chest pain or shortness of breath. The 10-year ASCVD risk score Mikey Bussing DC Brooke Bonito., et al., 2013) is: 4.3%   Values used to calculate the score:     Age: 13 years     Sex: Female     Is Non-Hispanic African American: No     Diabetic: No     Tobacco smoker: No     Systolic Blood Pressure: 361 mmHg     Is BP treated: Yes     HDL Cholesterol: 60 mg/dL     Total  Cholesterol: 130 mg/dL   Hypothyroidism Megin has a diagnosis of hypothyroidism. She is stable on 137 mcg of Synthroid daily. She reports hot flashes and denies palpitations.  ASSESSMENT AND PLAN:  Vitamin D deficiency - Plan: VITAMIN D 25 Hydroxy (Vit-D Deficiency, Fractures)  Prediabetes - Plan: metFORMIN (GLUCOPHAGE) 500 MG tablet, Comprehensive metabolic panel, Hemoglobin A1c, Insulin, random  Other specified hypothyroidism - Plan: T3, T4, free, TSH  Other hyperlipidemia - Plan: Lipid Panel With LDL/HDL Ratio  Class 3 severe obesity with serious comorbidity and body mass index (BMI) of 40.0 to 44.9 in adult, unspecified obesity type ([place])  PLAN:  Pre-Diabetes Mysty will continue to work on weight loss, exercise, and decreasing simple carbohydrates in her diet to help decrease the risk of diabetes. We dicussed metformin including benefits and risks. She was informed that eating too many simple carbohydrates or too many calories at one sitting increases the likelihood of GI side effects. We will check A1c, fasting insulin and fasting glucose today. Kariel agrees to continue metformin 500 mg two times daily with a meal #60 with no refills and follow up with [name] as directed to monitor her progress.  Vitamin D Deficiency Berniece was informed that low vitamin D levels contributes to fatigue and are associated with obesity, breast, and  colon cancer. Jaquelinne will continue to take prescription Vit D @50 ,000 IU every week and she will follow up for routine testing of vitamin D, at least 2-3 times per year. She was informed of the risk of over-replacement of vitamin D and agrees to not increase her dose unless she discusses this with [name] first. We will check vitamin D level today and Aviah agrees to follow up as directed.  Hyperlipidemia Creasie was informed of the American Heart Association Guidelines emphasizing intensive lifestyle modifications as the first line treatment for hyperlipidemia. We  discussed many lifestyle modifications today in depth, and Kennetta will continue to work on decreasing saturated fats such as fatty red meat, butter and many fried foods. She will also increase vegetables and lean protein in her diet and continue to work on exercise and weight loss efforts. We will check fasting lipid panel today and she will follow up as directed.  Hypothyroidism Chasiti was informed of the importance of good thyroid control to help with weight loss efforts. She was also informed that supertherapeutic thyroid levels are dangerous and will not improve weight loss results. We will check thyroid panel today and Kaisy will follow up as directed.  Obesity Jakeline is currently in the action stage of change. As such, her goal is to continue with weight loss efforts She has agreed to keep a food journal with 1500 to 1600 calories and 90 grams of protein daily Amirah will continue her current exercise regimen for weight loss and overall health benefits. We discussed the following Behavioral Modification Strategies today: planning for success, keep a strict food journal and increasing lean protein intake  Delphine has agreed to follow up with our clinic in 2 to 3 weeks. She was informed of the importance of frequent follow up visits to maximize her success with intensive lifestyle modifications for her multiple health conditions.  ALLERGIES: Allergies  Allergen Reactions  . Neomycin Other (See Comments)    Inflammation--eye drop   . Ace Inhibitors Cough    MEDICATIONS: Current Outpatient Medications on File Prior to Visit  Medication Sig Dispense Refill  . aspirin EC 81 MG tablet Take 81 mg by mouth at bedtime.    [name] atorvastatin (LIPITOR) 40 MG tablet TAKE 1 TABLET DAILY 90 tablet 3  . CALCIUM PO Take 1 tablet by mouth at bedtime.     [name] diltiazem (CARDIZEM CD) 180 MG 24 hr capsule Take 1 capsule (180 mg total) by mouth at bedtime. 90 capsule 3  . estradiol (ESTRACE VAGINAL) 0.1 MG/GM  vaginal cream INSERT 1 GM VAGINALLY TWICE WEEKLY 42.5 g 3  . hydrochlorothiazide (MICROZIDE) 12.5 MG capsule TAKE 1 CAPSULE DAILY.   SCHEDULE ANNUAL VISIT 90 capsule 4  . methocarbamol (ROBAXIN) 750 MG tablet     . Multiple Vitamins-Minerals (PRESERVISION AREDS 2 PO) Take 1 tablet by mouth 2 (two) times daily.    . naproxen sodium (ALEVE) 220 MG tablet Take 220 mg by mouth 2 (two) times daily as needed (for pain.).    [name] SYNTHROID 137 MCG tablet TAKE 1 TABLET DAILY 90 tablet 1  . TOPROL XL 50 MG 24 hr tablet TAKE 1 TABLET DAILY 90 tablet 3  . Vitamin D, Ergocalciferol, (DRISDOL) 1.25 MG (50000 UT) CAPS capsule Take 1 capsule (50,000 Units total) by mouth every 7 (seven) days. 13 capsule 0   No current facility-administered medications on file prior to visit.     PAST MEDICAL HISTORY: Past Medical History:  Diagnosis Date  . Arthritis  knees, back  . Cancer ([place])   . Carcinoid tumor of lung 2012   right found incidentally on chest xray eval for r/o vasxculitis   . Carpal tunnel syndrome, bilateral   . Coronary artery calcification 07/23/2016  . Double vision    history, no current problem  . Dysrhythmia    irregular occassional, no current problem  . Frequent UTI   . HNP (herniated nucleus pulposus), cervical 01/22/2011   C6-C7  Has seen dr Vertell Limber for this and Dr Tonita Cong  Positional numbness in hands no weakness    . Hypertension   . Hypothyroidism   . Infertility, female   . 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 Obstructive sleep apnea 07/27/2014   NPSG 07/2014:  AHI 85/hr, optimal cpap 10cm.  Download 08/2014:  Good compliance, breakthru apnea on 10cm.  Changed to auto 5-12cm >> good control of AHI on f/u download ONO on CPAP:      . Osteoarthritis   . PCOS (polycystic ovarian syndrome)   . SUI (stress urinary incontinence, female)    h/o    PAST SURGICAL HISTORY: Past Surgical History:  Procedure Laterality Date  . ANTERIOR CERVICAL DECOMP/DISCECTOMY FUSION  12/24/2017    Procedure: Cervical five-six Cervical six-seven Anterior cervical decompression/discectomy/fusion;  Surgeon: Erline Levine, MD;  Location: [facility];  Service: Neurosurgery;;  . Wilmon Pali RELEASE Left 12/24/2017   Procedure: LEFT CARPAL TUNNEL RELEASE;  Surgeon: Erline Levine, MD;  Location: [facility];  Service: Neurosurgery;  Laterality: Left;  . CESAREAN SECTION     x 1 - twins  . COLONOSCOPY    . DILATATION & CURRETTAGE/HYSTEROSCOPY WITH RESECTOCOPE N/A 07/18/2013   Procedure: DILATATION & CURETTAGE/HYSTEROSCOPY WITH RESECTOCOPE;  Surgeon: Lyman Speller, MD;  Location: [facility];  Service: Gynecology;  Laterality: N/A;  mass resection  . Lung tumor removed     Rt lung carcinoid  . MOUTH SURGERY     pre-cancerous ulcer removed  . SHOULDER OPEN ROTATOR CUFF REPAIR  07/20/2010   Right  . WISDOM TOOTH EXTRACTION      SOCIAL HISTORY: Social History   Tobacco Use  . Smoking status: Never Smoker  . Smokeless tobacco: Never Used  Substance Use Topics  . Alcohol use: Not Currently  . Drug use: No    FAMILY HISTORY: Family History  Problem Relation Age of Onset  . Stroke Mother        died age 108  . Hypertension Mother   . Sudden death Mother   . Alcoholism Mother   . Coronary artery disease Father        died age 14  . Hypertension Father   . Liver disease Father   . Alcohol abuse Father   . Obesity Father   . Parkinson's disease Brother   . Diabetes Brother   . Hypertension Brother   . Hypertension Maternal Uncle   . Colon cancer Neg Hx   . Rectal cancer Neg Hx   . Stomach cancer Neg Hx     ROS: Review of Systems  Constitutional: Positive for weight loss.  Respiratory: Negative for shortness of breath.   Cardiovascular: Negative for chest pain and palpitations.  Gastrointestinal: Negative for nausea and vomiting.  Musculoskeletal:       Negative for muscle weakness  Endo/Heme/Allergies:       Negative for hypoglycemia Positive for hot flashes    PHYSICAL  EXAM: Blood pressure 118/77, pulse 60, temperature 98 F (36.7 C), temperature source Oral, height 4\' 11"  (1.499  m), weight 216 lb 9.6 oz (98.2 kg), last menstrual period 02/12/2011, SpO2 97 %. Body mass index is 43.75 kg/m. Physical Exam Vitals signs reviewed.  Constitutional:      Appearance: Normal appearance. She is well-developed. She is obese.  Cardiovascular:     Rate and Rhythm: Normal rate.  Pulmonary:     Effort: Pulmonary effort is normal.  Musculoskeletal: Normal range of motion.  Skin:    General: Skin is warm and dry.  Neurological:     Mental Status: She is alert and oriented to person, place, and time.  Psychiatric:        Mood and Affect: Mood normal.        Behavior: Behavior normal.     RECENT LABS AND TESTS: BMET    Component Value Date/Time   NA 142 08/22/2019 0825   K 4.3 08/22/2019 0825   CL 102 08/22/2019 0825   CO2 26 08/22/2019 0825   GLUCOSE 106 (H) 08/22/2019 0825   GLUCOSE 100 (H) 06/07/2018 0739   BUN 23 08/22/2019 0825   CREATININE 0.68 08/22/2019 0825   CALCIUM 9.2 08/22/2019 0825   GFRNONAA 93 08/22/2019 0825   GFRAA 107 08/22/2019 0825   Lab Results  Component Value Date   HGBA1C 5.6 08/22/2019   HGBA1C 5.9 (H) 01/12/2019   HGBA1C 5.9 06/07/2018   Lab Results  Component Value Date   INSULIN 21.5 08/22/2019   INSULIN 20.4 01/12/2019   CBC    Component Value Date/Time   WBC 7.8 01/12/2019 1040   WBC 7.3 06/07/2018 0739   RBC 5.24 01/12/2019 1040   RBC 4.86 06/07/2018 0739   HGB 16.3 (H) 01/12/2019 1040   HGB 15.4 03/13/2014 1422   HCT 47.8 (H) 01/12/2019 1040   HCT 45.3 03/13/2014 1422   PLT 228.0 06/07/2018 0739   PLT 245 03/13/2014 1422   MCV 91 01/12/2019 1040   MCV 89.0 03/13/2014 1422   MCH 31.1 01/12/2019 1040   MCH 31.3 12/21/2017 1049   MCHC 34.1 01/12/2019 1040   MCHC 34.3 06/07/2018 0739   RDW 12.0 01/12/2019 1040   RDW 13.2 03/13/2014 1422   LYMPHSABS 2.3 01/12/2019 1040   LYMPHSABS 3.2 03/13/2014 1422    MONOABS 0.6 06/07/2018 0739   MONOABS 0.7 03/13/2014 1422   EOSABS 0.3 01/12/2019 1040   BASOSABS 0.1 01/12/2019 1040   BASOSABS 0.0 03/13/2014 1422   Iron/TIBC/Ferritin/ %Sat No results found for: IRON, TIBC, FERRITIN, IRONPCTSAT Lipid Panel     Component Value Date/Time   CHOL 130 08/22/2019 0825   CHOL 110 11/03/2016 0000   TRIG 55 08/22/2019 0825   TRIG 54 11/03/2016 0000   HDL 60 08/22/2019 0825   HDL 48 (L) 11/03/2016 0000   CHOLHDL 2 06/07/2018 0739   VLDL 10.8 06/07/2018 0739   LDLCALC 64 01/12/2019 1040   LDLCALC 49 11/03/2016 0000   Hepatic Function Panel     Component Value Date/Time   PROT 7.1 08/22/2019 0825   ALBUMIN 4.0 08/22/2019 0825   AST 21 08/22/2019 0825   ALT 25 08/22/2019 0825   ALKPHOS 81 08/22/2019 0825   BILITOT 0.5 08/22/2019 0825   BILIDIR 0.1 06/07/2018 0739   IBILI 0.4 09/08/2016 0749      Component Value Date/Time   TSH 1.220 08/22/2019 0825   TSH 2.490 01/12/2019 1040   TSH 3.66 06/07/2018 0739     Ref. Range 01/12/2019 10:40  Vitamin D, 25-Hydroxy Latest Ref Range: 30.0 - 100.0 ng/mL 33.1  OBESITY BEHAVIORAL INTERVENTION VISIT  Today's visit was # 13   Starting weight: 238 lbs Starting date: 01/12/2019 Today's weight : 216 lbs Today's date: 08/22/2019 Total lbs lost to date: 22    08/22/2019  Height 4\' 11"  (1.499 m)  Weight 216 lb 9.6 oz (98.2 kg)  BMI (Calculated) 43.72  BLOOD PRESSURE - SYSTOLIC 159  BLOOD PRESSURE - DIASTOLIC 77   Body Fat % 47.0 %  Total Body Water (lbs) 77 lbs    ASK: We discussed the diagnosis of obesity with Janifer Adie today and Belky agreed to give [name] permission to discuss obesity behavioral modification therapy today.  ASSESS: Cathlin has the diagnosis of obesity and her BMI today is 43.72 Khaniya is in the action stage of change   ADVISE: Zoie was educated on the multiple health risks of obesity as well as the benefit of weight loss to improve her health. She was advised of the  need for long term treatment and the importance of lifestyle modifications to improve her current health and to decrease her risk of future health problems.  AGREE: Multiple dietary modification options and treatment options were discussed and  Javon agreed to follow the recommendations documented in the above note.  ARRANGE: Adell was educated on the importance of frequent visits to treat obesity as outlined per CMS and USPSTF guidelines and agreed to schedule her next follow up appointment today.  I, Doreene Nest, am acting as transcriptionist for [employer], FNP-C  I have reviewed the above documentation for accuracy and completeness, and I agree with the above.  - Monterio Bob, FNP-C.

## 2019-08-24 ENCOUNTER — Other Ambulatory Visit: Payer: Self-pay | Admitting: Internal Medicine

## 2019-08-25 ENCOUNTER — Encounter (INDEPENDENT_AMBULATORY_CARE_PROVIDER_SITE_OTHER): Payer: Self-pay | Admitting: Family Medicine

## 2019-08-25 DIAGNOSIS — R7303 Prediabetes: Secondary | ICD-10-CM | POA: Insufficient documentation

## 2019-08-25 DIAGNOSIS — E559 Vitamin D deficiency, unspecified: Secondary | ICD-10-CM | POA: Insufficient documentation

## 2019-08-26 ENCOUNTER — Encounter (INDEPENDENT_AMBULATORY_CARE_PROVIDER_SITE_OTHER): Payer: Self-pay | Admitting: Family Medicine

## 2019-08-29 NOTE — Telephone Encounter (Signed)
Please advise 

## 2019-08-31 ENCOUNTER — Encounter: Payer: Self-pay | Admitting: Internal Medicine

## 2019-09-01 ENCOUNTER — Encounter: Payer: Self-pay | Admitting: Internal Medicine

## 2019-09-07 ENCOUNTER — Encounter (INDEPENDENT_AMBULATORY_CARE_PROVIDER_SITE_OTHER): Payer: Self-pay | Admitting: Family Medicine

## 2019-09-14 ENCOUNTER — Telehealth (INDEPENDENT_AMBULATORY_CARE_PROVIDER_SITE_OTHER): Payer: Medicare Other | Admitting: Family Medicine

## 2019-09-14 ENCOUNTER — Encounter (INDEPENDENT_AMBULATORY_CARE_PROVIDER_SITE_OTHER): Payer: Self-pay | Admitting: Family Medicine

## 2019-09-14 ENCOUNTER — Other Ambulatory Visit: Payer: Self-pay

## 2019-09-14 DIAGNOSIS — R7303 Prediabetes: Secondary | ICD-10-CM

## 2019-09-14 DIAGNOSIS — E66813 Obesity, class 3: Secondary | ICD-10-CM

## 2019-09-14 DIAGNOSIS — E559 Vitamin D deficiency, unspecified: Secondary | ICD-10-CM | POA: Diagnosis not present

## 2019-09-14 DIAGNOSIS — Z6841 Body Mass Index (BMI) 40.0 and over, adult: Secondary | ICD-10-CM | POA: Diagnosis not present

## 2019-09-14 MED ORDER — VITAMIN D (ERGOCALCIFEROL) 1.25 MG (50000 UNIT) PO CAPS: 50000.0000 [IU] | ORAL_CAPSULE | ORAL | 0 refills | Status: DC

## 2019-09-19 NOTE — Progress Notes (Signed)
Office: [phone]  /  Fax: [phone] TeleHealth Visit:  Nichole Smith has verbally consented to this TeleHealth visit today. The patient is located at home, the provider is located at the [facility] office. The participants in this visit include the listed provider and patient and any and all parties involved. The visit was conducted today via FaceTime.  HPI:   Chief Complaint: OBESITY Nichole Smith is here to discuss her progress with her obesity treatment plan. She is on the keep a food journal with 1500 to 1600 calories and 90 grams of protein daily plan and she has not been following her plan. She states she is exercising 0 minutes 0 times per week. Nichole Smith reports that her weight is 217 pounds at home. She has gained 1 pound. She has been moving to another home and she has been eating quite a bit of fast food. Nichole Smith plans on getting back to journaling this weekend. She is worried she will fall off the plan as she has in the past due to too many stressors. We were unable to weigh the patient today for this TeleHealth visit. She feels as if she has gained weight since her last visit ( 217 lbs 09/14/19) . She has lost 22 lbs since starting treatment with [name].  Vitamin D deficiency Nichole Smith has a diagnosis of vitamin D deficiency. Her last vitamin D level was at 58 on 08/22/19, and she is now at goal. [name] is currently taking vit D and she denies nausea, vomiting or muscle weakness.  Pre-Diabetes Nichole Smith has a diagnosis of prediabetes based on her elevated Hgb A1c and was informed this puts her at greater risk of developing diabetes. Her last A1c decreased to 5.5, from 5.9 eight months ago. She is on metformin, but she does not feel it helps with polyphagia. She continues to work on diet and exercise to decrease risk of diabetes. Nichole Smith denies hypoglycemia.  ASSESSMENT AND PLAN:  Vitamin D deficiency - Plan: Vitamin D, Ergocalciferol, (DRISDOL) 1.25 MG (50000 UT) CAPS capsule   Prediabetes  Class 3 severe obesity with serious comorbidity and body mass index (BMI) of 40.0 to 44.9 in adult, unspecified obesity type ([place])  PLAN:  Vitamin D Deficiency Nichole Smith was informed that low vitamin D levels contributes to fatigue and are associated with obesity, breast, and colon cancer. Nichole Smith agrees to continue to take prescription Vit D @50 ,000 IU every week #13 with no refills and she will follow up for routine testing of vitamin D, at least 2-3 times per year. She was informed of the risk of over-replacement of vitamin D and agrees to not increase her dose unless she discusses this with [name] first. Nichole Smith agrees to follow up with our clinic in 2 weeks. Nichole Smith will continue prescription vitamin D for a few months, and then we will switch to OTC vitamin D.  Pre-Diabetes Nichole Smith will continue to work on weight loss, exercise, and decreasing simple carbohydrates in her diet to help decrease the risk of diabetes.  Nichole Smith will continue metformin and follow up with [name] as directed to monitor her progress.  Obesity Nichole Smith is currently in the action stage of change. As such, her goal is to continue with weight loss efforts She has agreed to keep a food journal with 1500 to 1600 calories and 90 grams of protein daily Nichole Smith has been instructed to work up to a goal of 150 minutes of combined cardio and strengthening exercise per week for weight loss and overall health  benefits. We discussed the following Behavioral Modification Strategies today: planning for success, keep a strict food journal, better snacking choices and decrease eating out  Nichole Smith will be fluid restricted to 45 ounces per day starting 09/19/19, for one month per nephrology for testing.  Nichole Smith has agreed to follow up with our clinic in 2 weeks. She was informed of the importance of frequent follow up visits to maximize her success with intensive lifestyle modifications for her multiple health conditions.  ALLERGIES: Allergies   Allergen Reactions  . Neomycin Other (See Comments)    Inflammation--eye drop   . Ace Inhibitors Cough    MEDICATIONS: Current Outpatient Medications on File Prior to Visit  Medication Sig Dispense Refill  . aspirin EC 81 MG tablet Take 81 mg by mouth at bedtime.    [name] atorvastatin (LIPITOR) 40 MG tablet TAKE 1 TABLET DAILY 90 tablet 3  . CALCIUM PO Take 1 tablet by mouth at bedtime.     [name] diltiazem (CARDIZEM CD) 180 MG 24 hr capsule Take 1 capsule (180 mg total) by mouth at bedtime. 90 capsule 3  . estradiol (ESTRACE VAGINAL) 0.1 MG/GM vaginal cream INSERT 1 GM VAGINALLY TWICE WEEKLY 42.5 g 3  . hydrochlorothiazide (MICROZIDE) 12.5 MG capsule TAKE 1 CAPSULE DAILY.   SCHEDULE ANNUAL VISIT 90 capsule 4  . metFORMIN (GLUCOPHAGE) 500 MG tablet Take 1 tablet (500 mg total) by mouth 2 (two) times daily with a meal. 60 tablet 0  . methocarbamol (ROBAXIN) 750 MG tablet     . Multiple Vitamins-Minerals (PRESERVISION AREDS 2 PO) Take 1 tablet by mouth 2 (two) times daily.    . naproxen sodium (ALEVE) 220 MG tablet Take 220 mg by mouth 2 (two) times daily as needed (for pain.).    [name] SYNTHROID 137 MCG tablet TAKE 1 TABLET DAILY 90 tablet 3  . TOPROL XL 50 MG 24 hr tablet TAKE 1 TABLET DAILY 90 tablet 3   No current facility-administered medications on file prior to visit.     PAST MEDICAL HISTORY: Past Medical History:  Diagnosis Date  . Arthritis    knees, back  . Cancer ([place])   . Carcinoid tumor of lung 2012   right found incidentally on chest xray eval for r/o vasxculitis   . Carpal tunnel syndrome, bilateral   . Coronary artery calcification 07/23/2016  . Double vision    history, no current problem  . Dysrhythmia    irregular occassional, no current problem  . Frequent UTI   . HNP (herniated nucleus pulposus), cervical 01/22/2011   C6-C7  Has seen dr Vertell Limber for this and Dr Tonita Cong  Positional numbness in hands no weakness    . Hypertension   . Hypothyroidism   . Infertility, female    . 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 Obstructive sleep apnea 07/27/2014   NPSG 07/2014:  AHI 85/hr, optimal cpap 10cm.  Download 08/2014:  Good compliance, breakthru apnea on 10cm.  Changed to auto 5-12cm >> good control of AHI on f/u download ONO on CPAP:      . Osteoarthritis   . PCOS (polycystic ovarian syndrome)   . SUI (stress urinary incontinence, female)    h/o    PAST SURGICAL HISTORY: Past Surgical History:  Procedure Laterality Date  . ANTERIOR CERVICAL DECOMP/DISCECTOMY FUSION  12/24/2017   Procedure: Cervical five-six Cervical six-seven Anterior cervical decompression/discectomy/fusion;  Surgeon: Erline Levine, MD;  Location: [facility];  Service: Neurosurgery;;  . Wilmon Pali RELEASE Left 12/24/2017   Procedure:  LEFT CARPAL TUNNEL RELEASE;  Surgeon: Erline Levine, MD;  Location: [facility];  Service: Neurosurgery;  Laterality: Left;  . CESAREAN SECTION     x 1 - twins  . COLONOSCOPY    . DILATATION & CURRETTAGE/HYSTEROSCOPY WITH RESECTOCOPE N/A 07/18/2013   Procedure: DILATATION & CURETTAGE/HYSTEROSCOPY WITH RESECTOCOPE;  Surgeon: Lyman Speller, MD;  Location: [facility];  Service: Gynecology;  Laterality: N/A;  mass resection  . Lung tumor removed     Rt lung carcinoid  . MOUTH SURGERY     pre-cancerous ulcer removed  . SHOULDER OPEN ROTATOR CUFF REPAIR  07/20/2010   Right  . WISDOM TOOTH EXTRACTION      SOCIAL HISTORY: Social History   Tobacco Use  . Smoking status: Never Smoker  . Smokeless tobacco: Never Used  Substance Use Topics  . Alcohol use: Not Currently  . Drug use: No    FAMILY HISTORY: Family History  Problem Relation Age of Onset  . Stroke Mother        died age 58  . Hypertension Mother   . Sudden death Mother   . Alcoholism Mother   . Coronary artery disease Father        died age 27  . Hypertension Father   . Liver disease Father   . Alcohol abuse Father   . Obesity Father   . Parkinson's disease Brother   . Diabetes Brother    . Hypertension Brother   . Hypertension Maternal Uncle   . Colon cancer Neg Hx   . Rectal cancer Neg Hx   . Stomach cancer Neg Hx     ROS: Review of Systems  Constitutional: Negative for weight loss.  Gastrointestinal: Negative for nausea and vomiting.  Musculoskeletal:       Negative for muscle weakness  Endo/Heme/Allergies:       Positive for polyphagia Negative for hypoglycemia    PHYSICAL EXAM: Pt in no acute distress  RECENT LABS AND TESTS: BMET    Component Value Date/Time   NA 142 08/22/2019 0825   K 4.3 08/22/2019 0825   CL 102 08/22/2019 0825   CO2 26 08/22/2019 0825   GLUCOSE 106 (H) 08/22/2019 0825   GLUCOSE 100 (H) 06/07/2018 0739   BUN 23 08/22/2019 0825   CREATININE 0.68 08/22/2019 0825   CALCIUM 9.2 08/22/2019 0825   GFRNONAA 93 08/22/2019 0825   GFRAA 107 08/22/2019 0825   Lab Results  Component Value Date   HGBA1C 5.6 08/22/2019   HGBA1C 5.9 (H) 01/12/2019   HGBA1C 5.9 06/07/2018   Lab Results  Component Value Date   INSULIN 21.5 08/22/2019   INSULIN 20.4 01/12/2019   CBC    Component Value Date/Time   WBC 7.8 01/12/2019 1040   WBC 7.3 06/07/2018 0739   RBC 5.24 01/12/2019 1040   RBC 4.86 06/07/2018 0739   HGB 16.3 (H) 01/12/2019 1040   HGB 15.4 03/13/2014 1422   HCT 47.8 (H) 01/12/2019 1040   HCT 45.3 03/13/2014 1422   PLT 228.0 06/07/2018 0739   PLT 245 03/13/2014 1422   MCV 91 01/12/2019 1040   MCV 89.0 03/13/2014 1422   MCH 31.1 01/12/2019 1040   MCH 31.3 12/21/2017 1049   MCHC 34.1 01/12/2019 1040   MCHC 34.3 06/07/2018 0739   RDW 12.0 01/12/2019 1040   RDW 13.2 03/13/2014 1422   LYMPHSABS 2.3 01/12/2019 1040   LYMPHSABS 3.2 03/13/2014 1422   MONOABS 0.6 06/07/2018 0739   MONOABS 0.7 03/13/2014 1422  EOSABS 0.3 01/12/2019 1040   BASOSABS 0.1 01/12/2019 1040   BASOSABS 0.0 03/13/2014 1422   Iron/TIBC/Ferritin/ %Sat No results found for: IRON, TIBC, FERRITIN, IRONPCTSAT Lipid Panel     Component Value Date/Time    CHOL 130 08/22/2019 0825   CHOL 110 11/03/2016 0000   TRIG 55 08/22/2019 0825   TRIG 54 11/03/2016 0000   HDL 60 08/22/2019 0825   HDL 48 (L) 11/03/2016 0000   CHOLHDL 2 06/07/2018 0739   VLDL 10.8 06/07/2018 0739   LDLCALC 58 08/22/2019 0825   LDLCALC 49 11/03/2016 0000   Hepatic Function Panel     Component Value Date/Time   PROT 7.1 08/22/2019 0825   ALBUMIN 4.0 08/22/2019 0825   AST 21 08/22/2019 0825   ALT 25 08/22/2019 0825   ALKPHOS 81 08/22/2019 0825   BILITOT 0.5 08/22/2019 0825   BILIDIR 0.1 06/07/2018 0739   IBILI 0.4 09/08/2016 0749      Component Value Date/Time   TSH 1.220 08/22/2019 0825   TSH 2.490 01/12/2019 1040   TSH 3.66 06/07/2018 0739     Ref. Range 08/22/2019 08:25  Vitamin D, 25-Hydroxy Latest Ref Range: 30.0 - 100.0 ng/mL 58.0    I, Doreene Nest, am acting as Location manager for [employer], FNP-C I have reviewed the above documentation for accuracy and completeness, and I agree with the above.  - Pegeen Stiger, FNP-C.

## 2019-09-19 NOTE — Progress Notes (Signed)
Chief Complaint  Patient presents with   Annual Exam    Pt wants to talk about her ears she states that they have been bugging her     HPI: Nichole Smith 63 y.o. comes in today for welcome to  Preventive Medicare exam/ wellness visit .    She has a number of medical problems followed by various specialists.  .  Vision : Retina macular degeneration   [facility]  reintal specilist .   OSASood.   No change  No resp sx now   GU sees urologist on new medication   Weight management.  recnetly  Metformin.    losinf 1/2 # per week or so    Wax in right ear bothering her .   Blood pressures been okay she sees Dr. Radford Pax once a year.  She has elevated high risk lipids.  HLD  meds   She had a recent chest x-ray surveillance that was normal.  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  Thyroid medicine daily labs were done before visit.   Health Maintenance  Topic Date Due   HIV Screening  08/27/1971   INFLUENZA VACCINE  07/02/2019   PNA vac Low Risk Adult (1 of 2 - PCV13) 08/28/2019   PAP SMEAR-Modifier  03/09/2020   COLONOSCOPY  09/09/2020   MAMMOGRAM  08/14/2021   TETANUS/TDAP  01/31/2024   DEXA SCAN  Completed   Hepatitis C Screening  Completed   Health Maintenance Review LIFESTYLE:  Exercise:   10 k steps dog and child care  Tobacco/ETS:n Alcohol: n Sugar beverages:n Sleep: good  Drug use: no HH:  2   And child care for  grandkids     Hearing:  Wax in right ear   Otherwise good   Vision:  No limitations at present . galsses Last eye check UTD has MD early?  To fu in a few months   Safety:  Has smoke detector and wears seat belts.   No excess sun exposure. Sees dentist regularly.  Falls: n  Advance directive :  Reviewed  Has one.  Memory: Felt to be good  , no concern from her or her family.  Depression: No anhedonia unusual crying or depressive symptoms  Nutrition: Eats well balanced diet; adequate calcium and vitamin D.  No swallowing chewing problems.  Injury: no major injuries in the last six months.  Other healthcare providers:  Reviewed today .   Preventive parameters: up-to-date  Reviewed   ADLS:   There are no problems or need for assistance  driving, feeding, obtaining food, dressing, toileting and bathing, managing money using phone. She is independent.   ROS:  GEN/ HEENT: No fever, significant weight changes sweats headaches vision problems hearing changes, CV/ PULM; No chest pain shortness of breath cough, syncope,edema  change in exercise tolerance. GI /GU: No adominal pain, vomiting, change in bowel habits. No blood in the stool. No significant GU symptoms. SKIN/HEME: ,no acute skin rashes suspicious lesions or bleeding. No lymphadenopathy, nodules, masses.  NEURO/ PSYCH:  No neurologic signs such as weakness numbness. No depression anxiety. IMM/ Allergy: No unusual infections.  Allergy .   REST of 12 system review negative except as per HPI   Past Medical History:  Diagnosis Date   Arthritis    knees, back   Cancer ([place])    Carcinoid tumor of lung 2012   right found incidentally on chest xray eval for r/o vasxculitis    Carpal  tunnel syndrome, bilateral    Coronary artery calcification 07/23/2016   Double vision    history, no current problem   Dysrhythmia    irregular occassional, no current problem   Frequent UTI    HNP (herniated nucleus pulposus), cervical 01/22/2011   C6-C7  Has seen dr Vertell Limber for this and Dr Tonita Cong  Positional numbness in hands no weakness     Hypertension    Hypothyroidism    Infertility, female    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  Download 08/2014:  Good compliance, breakthru apnea on 10cm.  Changed to auto 5-12cm >> good control of AHI on f/u download ONO on CPAP:       Osteoarthritis    PCOS (polycystic ovarian syndrome)    SUI (stress urinary  incontinence, female)    h/o    Family History  Problem Relation Age of Onset   Stroke Mother        died age 61   Hypertension Mother    Sudden death Mother    Alcoholism Mother    Coronary artery disease Father        died age 36   Hypertension Father    Liver disease Father    Alcohol abuse Father    Obesity Father    Parkinson's disease Brother    Diabetes Brother    Hypertension Brother    Hypertension Maternal Uncle    Colon cancer Neg Hx    Rectal cancer Neg Hx    Stomach cancer Neg Hx     Social History   Socioeconomic History   Marital status: Married    Spouse name: Nichole Smith   Number of children: 2   Years of education: Not on file   Highest education level: Not on file  Occupational History   Occupation: retired    Fish farm manager: Therapist, music strain: Not on file   Food insecurity    Worry: Not on file    Inability: Not on Lexicographer needs    Medical: Not on file    Non-medical: Not on file  Tobacco Use   Smoking status: Never Smoker   Smokeless tobacco: Never Used  Substance and Sexual Activity   Alcohol use: Not Currently   Drug use: No   Sexual activity: Not Currently    Partners: Male    Birth control/protection: Post-menopausal  Lifestyle   Physical activity    Days per week: Not on file    Minutes per session: Not on file   Stress: Not on file  Relationships   Social connections    Talks on phone: Not on file    Gets together: Not on file    Attends religious service: Not on file    Active member of club or organization: Not on file    Attends meetings of clubs or organizations: Not on file    Relationship status: Not on file  Other Topics Concern   Not on file  Social History Narrative   hhof 2    2 Children at college and  beyond      [name]    Married    Retired age 29 2015   Recently moved taking care of grandchildren during  the week.    Outpatient Encounter Medications as of 09/21/2019  Medication Sig   aspirin EC 81 MG tablet Take  81 mg by mouth at bedtime.   atorvastatin (LIPITOR) 40 MG tablet TAKE 1 TABLET DAILY   CALCIUM PO Take 1 tablet by mouth at bedtime.    diltiazem (CARDIZEM CD) 180 MG 24 hr capsule Take 1 capsule (180 mg total) by mouth at bedtime.   estradiol (ESTRACE VAGINAL) 0.1 MG/GM vaginal cream INSERT 1 GM VAGINALLY TWICE WEEKLY   hydrochlorothiazide (MICROZIDE) 12.5 MG capsule TAKE 1 CAPSULE DAILY.   SCHEDULE ANNUAL VISIT   metFORMIN (GLUCOPHAGE) 500 MG tablet Take 1 tablet (500 mg total) by mouth 2 (two) times daily with a meal.   methocarbamol (ROBAXIN) 750 MG tablet    mirabegron ER (MYRBETRIQ) 50 MG TB24 tablet Take 50 mg by mouth daily.   Multiple Vitamins-Minerals (PRESERVISION AREDS 2 PO) Take 1 tablet by mouth 2 (two) times daily.   naproxen sodium (ALEVE) 220 MG tablet Take 220 mg by mouth 2 (two) times daily as needed (for pain.).   SYNTHROID 137 MCG tablet TAKE 1 TABLET DAILY   TOPROL XL 50 MG 24 hr tablet TAKE 1 TABLET DAILY   Vitamin D, Ergocalciferol, (DRISDOL) 1.25 MG (50000 UT) CAPS capsule Take 1 capsule (50,000 Units total) by mouth every 7 (seven) days.   No facility-administered encounter medications on file as of 09/21/2019.     EXAM:  BP 130/72 (BP Location: Right Arm, Patient Position: Sitting, Cuff Size: Normal)    Pulse 70    Temp 98.2 F (36.8 C) (Temporal)    Ht 4\' 11"  (1.499 m)    Wt 217 lb 12.8 oz (98.8 kg)    LMP 02/12/2011    SpO2 97%    BMI 43.99 kg/m   Body mass index is 43.99 kg/m.  Physical Exam: Vital signs reviewed vision specialist  DZH:GDJM is a well-developed well-nourished alert cooperative   who appears stated age in no acute distress.  HEENT: normocephalic atraumatic , Eyes: PERRL EOM's full, conjunctiva clear, Nares: , Ears: no deformity EAC's right TM occluded by brown wax after irrigation TM intact minimal erythema of  canal.  Patient states she hears better.  TMs with normal landmarks. Mouth: clear OP, Mast NECK: supple without masses, thyromegaly or bruits. CHEST/PULM:  Clear to auscultation and percussion breath sounds equal no wheeze , rales or rhonchi. No chest wall deformities or tenderness. CV: PMI is nondisplaced, S1 S2 no gallops, murmurs, rubs. Peripheral pulses are present no JVD .  ABDOMEN: Bowel sounds normal nontender  No guard or rebound, no hepato splenomegal no CVA tenderness.   Extremtities:  No clubbing cyanosis or edema, no acute joint swelling or redness no focal atrophy NEURO:  Oriented x3, cranial nerves 3-12 appear to be intact, no obvious focal weakness,gait within normal limits no abnormal reflexes or asymmetrical SKIN: No acute rashes normal turgor, color, no bruising or petechiae. PSYCH: Oriented, good eye contact, no obvious depression anxiety, cognition and judgment appear normal. LN: no cervical axillary i adenopathy No noted deficits in memory, attention, and speech.   Lab Results  Component Value Date   WBC 7.8 01/12/2019   HGB 16.3 (H) 01/12/2019   HCT 47.8 (H) 01/12/2019   PLT 228.0 06/07/2018   GLUCOSE 106 (H) 08/22/2019   CHOL 130 08/22/2019   TRIG 55 08/22/2019   HDL 60 08/22/2019   LDLCALC 58 08/22/2019   ALT 25 08/22/2019   AST 21 08/22/2019   NA 142 08/22/2019   K 4.3 08/22/2019   CL 102 08/22/2019   CREATININE 0.68 08/22/2019   BUN  23 08/22/2019   CO2 26 08/22/2019   TSH 1.220 08/22/2019   INR 0.96 06/11/2011   HGBA1C 5.6 08/22/2019    ASSESSMENT AND PLAN:  Discussed the following assessment and plan:  Welcome to Medicare preventive visit  Need for immunization against influenza - Plan: Flu Vaccine QUAD High Dose(Fluad)  Medication management  Essential hypertension  Right ear impacted cerumen  BMI 45.0-49.9, adult (HCC)  Thyroiditis, autoimmune  Class 3 severe obesity with serious comorbidity and body mass index (BMI) of 40.0 to 44.9  in adult, unspecified obesity type ([place])  Polycythemia, secondary Return in about 1 year (around 09/20/2020) for preventive /cpx and medications. Ear wash today . prevnar 13  Soon and then pneumovax 23 in a year .  Continue weight management  Discussed lab work and reviewed health care maintenance Patient Care Team: Kawana Hegel, Standley Brooking, MD as PCP - General Sueanne Margarita, MD as PCP - Cardiology (Cardiology) Jari Pigg, MD (Dermatology) Megan Salon, MD as Attending Physician (Gynecology) Earlie Server, MD as Attending Physician (Orthopedic Surgery)  Patient Instructions   Can come back for  prevnar 13 immunization and then next year get the pneumovax 23.   Have pharmacy or mail away contact us for refills   . Wt Readings from Last 3 Encounters:  09/21/19 217 lb 12.8 oz (98.8 kg)  08/22/19 216 lb 9.6 oz (98.2 kg)  07/11/19 219 lb (99.3 kg)      Health Maintenance, Female Adopting a healthy lifestyle and getting preventive care are important in promoting health and wellness. Ask your health care provider about:  The right schedule for you to have regular tests and exams.  Things you can do on your own to prevent diseases and keep yourself healthy. What should I know about diet, weight, and exercise? Eat a healthy diet   Eat a diet that includes plenty of vegetables, fruits, low-fat dairy products, and lean protein.  Do not eat a lot of foods that are high in solid fats, added sugars, or sodium. Maintain a healthy weight Body mass index (BMI) is used to identify weight problems. It estimates body fat based on height and weight. Your health care provider can help determine your BMI and help you achieve or maintain a healthy weight. Get regular exercise Get regular exercise. This is one of the most important things you can do for your health. Most adults should:  Exercise for at least 150 minutes each week. The exercise should increase your heart rate and make you sweat  (moderate-intensity exercise).  Do strengthening exercises at least twice a week. This is in addition to the moderate-intensity exercise.  Spend less time sitting. Even light physical activity can be beneficial. Watch cholesterol and blood lipids Have your blood tested for lipids and cholesterol at 65 years of age, then have this test every 5 years. Have your cholesterol levels checked more often if:  Your lipid or cholesterol levels are high.  You are older than 65 years of age.  You are at high risk for heart disease. What should I know about cancer screening? Depending on your health history and family history, you may need to have cancer screening at various ages. This may include screening for:  Breast cancer.  Cervical cancer.  Colorectal cancer.  Skin cancer.  Lung cancer. What should I know about heart disease, diabetes, and high blood pressure? Blood pressure and heart disease  High blood pressure causes heart disease and increases the risk of stroke. This is  more likely to develop in people who have high blood pressure readings, are of African descent, or are overweight.  Have your blood pressure checked: ? Every 3-5 years if you are 43-61 years of age. ? Every year if you are 58 years old or older. Diabetes Have regular diabetes screenings. This checks your fasting blood sugar level. Have the screening done:  Once every three years after age 34 if you are at a normal weight and have a low risk for diabetes.  More often and at a younger age if you are overweight or have a high risk for diabetes. What should I know about preventing infection? Hepatitis B If you have a higher risk for hepatitis B, you should be screened for this virus. Talk with your health care provider to find out if you are at risk for hepatitis B infection. Hepatitis C Testing is recommended for:  Everyone born from [birth year] through 1967.  Anyone with known risk factors for hepatitis  C. Sexually transmitted infections (STIs)  Get screened for STIs, including gonorrhea and chlamydia, if: ? You are sexually active and are younger than 65 years of age. ? You are older than 65 years of age and your health care provider tells you that you are at risk for this type of infection. ? Your sexual activity has changed since you were last screened, and you are at increased risk for chlamydia or gonorrhea. Ask your health care provider if you are at risk.  Ask your health care provider about whether you are at high risk for HIV. Your health care provider may recommend a prescription medicine to help prevent HIV infection. If you choose to take medicine to prevent HIV, you should first get tested for HIV. You should then be tested every 3 months for as long as you are taking the medicine. Pregnancy  If you are about to stop having your period (premenopausal) and you may become pregnant, seek counseling before you get pregnant.  Take 400 to 800 micrograms (mcg) of folic acid every day if you become pregnant.  Ask for birth control (contraception) if you want to prevent pregnancy. Osteoporosis and menopause Osteoporosis is a disease in which the bones lose minerals and strength with aging. This can result in bone fractures. If you are 72 years old or older, or if you are at risk for osteoporosis and fractures, ask your health care provider if you should:  Be screened for bone loss.  Take a calcium or vitamin D supplement to lower your risk of fractures.  Be given hormone replacement therapy (HRT) to treat symptoms of menopause. Follow these instructions at home: Lifestyle  Do not use any products that contain nicotine or tobacco, such as cigarettes, e-cigarettes, and chewing tobacco. If you need help quitting, ask your health care provider.  Do not use street drugs.  Do not share needles.  Ask your health care provider for help if you need support or information about quitting  drugs. Alcohol use  Do not drink alcohol if: ? Your health care provider tells you not to drink. ? You are pregnant, may be pregnant, or are planning to become pregnant.  If you drink alcohol: ? Limit how much you use to 0-1 drink a day. ? Limit intake if you are breastfeeding.  Be aware of how much alcohol is in your drink. In the [place] one drink equals one 12 oz bottle of beer (355 mL), one 5 oz glass of wine (148 mL), or one  1 oz glass of hard liquor (44 mL). General instructions  Schedule regular health, dental, and eye exams.  Stay current with your vaccines.  Tell your health care provider if: ? You often feel depressed. ? You have ever been abused or do not feel safe at home. Summary  Adopting a healthy lifestyle and getting preventive care are important in promoting health and wellness.  Follow your health care provider's instructions about healthy diet, exercising, and getting tested or screened for diseases.  Follow your health care provider's instructions on monitoring your cholesterol and blood pressure. This information is not intended to replace advice given to you by your health care provider. Make sure you discuss any questions you have with your health care provider. Document Released: 06/02/2011 Document Revised: 11/10/2018 Document Reviewed: 11/10/2018 Elsevier Patient Education  [place].    Lab Results  Component Value Date   WBC 7.8 01/12/2019   HGB 16.3 (H) 01/12/2019   HCT 47.8 (H) 01/12/2019   PLT 228.0 06/07/2018   GLUCOSE 106 (H) 08/22/2019   CHOL 130 08/22/2019   TRIG 55 08/22/2019   HDL 60 08/22/2019   LDLCALC 58 08/22/2019   ALT 25 08/22/2019   AST 21 08/22/2019   NA 142 08/22/2019   K 4.3 08/22/2019   CL 102 08/22/2019   CREATININE 0.68 08/22/2019   BUN 23 08/22/2019   CO2 26 08/22/2019   TSH 1.220 08/22/2019   INR 0.96 06/11/2011   HGBA1C 5.6 08/22/2019      Valdis Bevill K. Terrika Zuver M.D.

## 2019-09-21 ENCOUNTER — Other Ambulatory Visit: Payer: Self-pay

## 2019-09-21 ENCOUNTER — Encounter: Payer: Self-pay | Admitting: Internal Medicine

## 2019-09-21 ENCOUNTER — Ambulatory Visit (INDEPENDENT_AMBULATORY_CARE_PROVIDER_SITE_OTHER): Payer: Medicare Other | Admitting: Internal Medicine

## 2019-09-21 VITALS — BP 130/72 | HR 70 | Temp 98.2°F | Ht 59.0 in | Wt 217.8 lb

## 2019-09-21 DIAGNOSIS — Z23 Encounter for immunization: Secondary | ICD-10-CM | POA: Diagnosis not present

## 2019-09-21 DIAGNOSIS — Z0000 Encounter for general adult medical examination without abnormal findings: Secondary | ICD-10-CM

## 2019-09-21 DIAGNOSIS — Z6841 Body Mass Index (BMI) 40.0 and over, adult: Secondary | ICD-10-CM

## 2019-09-21 DIAGNOSIS — E063 Autoimmune thyroiditis: Secondary | ICD-10-CM

## 2019-09-21 DIAGNOSIS — D751 Secondary polycythemia: Secondary | ICD-10-CM

## 2019-09-21 DIAGNOSIS — I10 Essential (primary) hypertension: Secondary | ICD-10-CM

## 2019-09-21 DIAGNOSIS — Z79899 Other long term (current) drug therapy: Secondary | ICD-10-CM | POA: Diagnosis not present

## 2019-09-21 DIAGNOSIS — H6121 Impacted cerumen, right ear: Secondary | ICD-10-CM | POA: Diagnosis not present

## 2019-09-21 NOTE — Patient Instructions (Signed)
Can come back for  prevnar 13 immunization and then next year get the pneumovax 23.   Have pharmacy or mail away contact us for refills   . Wt Readings from Last 3 Encounters:  09/21/19 217 lb 12.8 oz (98.8 kg)  08/22/19 216 lb 9.6 oz (98.2 kg)  07/11/19 219 lb (99.3 kg)      Health Maintenance, Female Adopting a healthy lifestyle and getting preventive care are important in promoting health and wellness. Ask your health care provider about:  The right schedule for you to have regular tests and exams.  Things you can do on your own to prevent diseases and keep yourself healthy. What should I know about diet, weight, and exercise? Eat a healthy diet   Eat a diet that includes plenty of vegetables, fruits, low-fat dairy products, and lean protein.  Do not eat a lot of foods that are high in solid fats, added sugars, or sodium. Maintain a healthy weight Body mass index (BMI) is used to identify weight problems. It estimates body fat based on height and weight. Your health care provider can help determine your BMI and help you achieve or maintain a healthy weight. Get regular exercise Get regular exercise. This is one of the most important things you can do for your health. Most adults should:  Exercise for at least 150 minutes each week. The exercise should increase your heart rate and make you sweat (moderate-intensity exercise).  Do strengthening exercises at least twice a week. This is in addition to the moderate-intensity exercise.  Spend less time sitting. Even light physical activity can be beneficial. Watch cholesterol and blood lipids Have your blood tested for lipids and cholesterol at 65 years of age, then have this test every 5 years. Have your cholesterol levels checked more often if:  Your lipid or cholesterol levels are high.  You are older than 65 years of age.  You are at high risk for heart disease. What should I know about cancer screening? Depending on your  health history and family history, you may need to have cancer screening at various ages. This may include screening for:  Breast cancer.  Cervical cancer.  Colorectal cancer.  Skin cancer.  Lung cancer. What should I know about heart disease, diabetes, and high blood pressure? Blood pressure and heart disease  High blood pressure causes heart disease and increases the risk of stroke. This is more likely to develop in people who have high blood pressure readings, are of African descent, or are overweight.  Have your blood pressure checked: ? Every 3-5 years if you are 17-69 years of age. ? Every year if you are 44 years old or older. Diabetes Have regular diabetes screenings. This checks your fasting blood sugar level. Have the screening done:  Once every three years after age 18 if you are at a normal weight and have a low risk for diabetes.  More often and at a younger age if you are overweight or have a high risk for diabetes. What should I know about preventing infection? Hepatitis B If you have a higher risk for hepatitis B, you should be screened for this virus. Talk with your health care provider to find out if you are at risk for hepatitis B infection. Hepatitis C Testing is recommended for:  Everyone born from [birth year] through 1967.  Anyone with known risk factors for hepatitis C. Sexually transmitted infections (STIs)  Get screened for STIs, including gonorrhea and chlamydia, if: ? You are  sexually active and are younger than 65 years of age. ? You are older than 65 years of age and your health care provider tells you that you are at risk for this type of infection. ? Your sexual activity has changed since you were last screened, and you are at increased risk for chlamydia or gonorrhea. Ask your health care provider if you are at risk.  Ask your health care provider about whether you are at high risk for HIV. Your health care provider may recommend a prescription  medicine to help prevent HIV infection. If you choose to take medicine to prevent HIV, you should first get tested for HIV. You should then be tested every 3 months for as long as you are taking the medicine. Pregnancy  If you are about to stop having your period (premenopausal) and you may become pregnant, seek counseling before you get pregnant.  Take 400 to 800 micrograms (mcg) of folic acid every day if you become pregnant.  Ask for birth control (contraception) if you want to prevent pregnancy. Osteoporosis and menopause Osteoporosis is a disease in which the bones lose minerals and strength with aging. This can result in bone fractures. If you are 10 years old or older, or if you are at risk for osteoporosis and fractures, ask your health care provider if you should:  Be screened for bone loss.  Take a calcium or vitamin D supplement to lower your risk of fractures.  Be given hormone replacement therapy (HRT) to treat symptoms of menopause. Follow these instructions at home: Lifestyle  Do not use any products that contain nicotine or tobacco, such as cigarettes, e-cigarettes, and chewing tobacco. If you need help quitting, ask your health care provider.  Do not use street drugs.  Do not share needles.  Ask your health care provider for help if you need support or information about quitting drugs. Alcohol use  Do not drink alcohol if: ? Your health care provider tells you not to drink. ? You are pregnant, may be pregnant, or are planning to become pregnant.  If you drink alcohol: ? Limit how much you use to 0-1 drink a day. ? Limit intake if you are breastfeeding.  Be aware of how much alcohol is in your drink. In the [place] one drink equals one 12 oz bottle of beer (355 mL), one 5 oz glass of wine (148 mL), or one 1 oz glass of hard liquor (44 mL). General instructions  Schedule regular health, dental, and eye exams.  Stay current with your vaccines.  Tell your health  care provider if: ? You often feel depressed. ? You have ever been abused or do not feel safe at home. Summary  Adopting a healthy lifestyle and getting preventive care are important in promoting health and wellness.  Follow your health care provider's instructions about healthy diet, exercising, and getting tested or screened for diseases.  Follow your health care provider's instructions on monitoring your cholesterol and blood pressure. This information is not intended to replace advice given to you by your health care provider. Make sure you discuss any questions you have with your health care provider. Document Released: 06/02/2011 Document Revised: 11/10/2018 Document Reviewed: 11/10/2018 Elsevier Patient Education  [employer].    Lab Results  Component Value Date   WBC 7.8 01/12/2019   HGB 16.3 (H) 01/12/2019   HCT 47.8 (H) 01/12/2019   PLT 228.0 06/07/2018   GLUCOSE 106 (H) 08/22/2019   CHOL 130 08/22/2019  TRIG 55 08/22/2019   HDL 60 08/22/2019   LDLCALC 58 08/22/2019   ALT 25 08/22/2019   AST 21 08/22/2019   NA 142 08/22/2019   K 4.3 08/22/2019   CL 102 08/22/2019   CREATININE 0.68 08/22/2019   BUN 23 08/22/2019   CO2 26 08/22/2019   TSH 1.220 08/22/2019   INR 0.96 06/11/2011   HGBA1C 5.6 08/22/2019

## 2019-09-28 ENCOUNTER — Ambulatory Visit (INDEPENDENT_AMBULATORY_CARE_PROVIDER_SITE_OTHER): Payer: Medicare Other | Admitting: Family Medicine

## 2019-09-28 ENCOUNTER — Encounter (INDEPENDENT_AMBULATORY_CARE_PROVIDER_SITE_OTHER): Payer: Self-pay | Admitting: Family Medicine

## 2019-09-28 ENCOUNTER — Other Ambulatory Visit: Payer: Self-pay

## 2019-09-28 VITALS — BP 125/76 | HR 62 | Temp 98.1°F | Ht 59.0 in | Wt 216.0 lb

## 2019-09-28 DIAGNOSIS — Z6841 Body Mass Index (BMI) 40.0 and over, adult: Secondary | ICD-10-CM

## 2019-09-28 DIAGNOSIS — R7303 Prediabetes: Secondary | ICD-10-CM | POA: Diagnosis not present

## 2019-09-29 ENCOUNTER — Other Ambulatory Visit: Payer: Self-pay | Admitting: Internal Medicine

## 2019-09-29 NOTE — Progress Notes (Addendum)
Office: [phone]  /  Fax: [phone]   HPI:   Chief Complaint: OBESITY Nichole Smith is here to discuss her progress with her obesity treatment plan. She is on the keep a food journal with 1600 calories and 90 grams of protein plan and she is following her eating plan approximately 75 % of the time. She states she is walking 30 to 40 minutes 5 times per week. Nichole Smith has been moving over the past few weeks, and has been somewhat off the plan. She is doing physical therapy for urinary stress incontinence and fluid is restricted to 45 ounces daily for a month. Her weight is 216 lb (98 kg) today and she has maintained weight since her last visit. She has lost 22 lbs since starting treatment with [name].  Pre-Diabetes Nichole Smith has a diagnosis of prediabetes based on her elevated Hgb A1c and was informed this puts her at greater risk of developing diabetes. She is now remembering to take the second dose of metformin. Nichole Smith does not think it helps with polyphagia. She continues to work on diet and exercise to decrease risk of diabetes.  Lab Results  Component Value Date   HGBA1C 5.6 08/22/2019    ASSESSMENT AND PLAN:  Prediabetes  Class 3 severe obesity with serious comorbidity and body mass index (BMI) of 40.0 to 44.9 in adult, unspecified obesity type Nichole Smith)  PLAN:  Pre-Diabetes Nichole Smith will continue to work on weight loss, exercise, and decreasing simple carbohydrates in her diet to help decrease the risk of diabetes. We dicussed metformin including benefits and risks.  Nichole Smith will continue metformin for now and a prescription was not written today. Nichole Smith agreed to follow up with [name] as directed to monitor her progress.  Obesity Nichole Smith is currently in the action stage of change. As such, her goal is to continue with weight loss efforts She has agreed to keep a food journal with 1500 to 1600 calories and 90 grams of protein daily Nichole Smith will do water aerobics 2 times per week for weight loss and  overall health benefits. We discussed the following Behavioral Modification Strategies today: dealing with family or coworker sabotage  Handouts for recipes were given to patient today.  Nichole Smith has agreed to follow up with our clinic in 3 weeks. She was informed of the importance of frequent follow up visits to maximize her success with intensive lifestyle modifications for her multiple health conditions.  ALLERGIES: Allergies  Allergen Reactions   Neomycin Other (See Comments)    Inflammation--eye drop    Ace Inhibitors Cough    MEDICATIONS: Current Outpatient Medications on File Prior to Visit  Medication Sig Dispense Refill   aspirin EC 81 MG tablet Take 81 mg by mouth at bedtime.     atorvastatin (LIPITOR) 40 MG tablet TAKE 1 TABLET DAILY 90 tablet 3   CALCIUM PO Take 1 tablet by mouth at bedtime.      diltiazem (CARDIZEM CD) 180 MG 24 hr capsule Take 1 capsule (180 mg total) by mouth at bedtime. 90 capsule 3   estradiol (ESTRACE VAGINAL) 0.1 MG/GM vaginal cream INSERT 1 GM VAGINALLY TWICE WEEKLY 42.5 g 3   hydrochlorothiazide (MICROZIDE) 12.5 MG capsule TAKE 1 CAPSULE DAILY.   SCHEDULE ANNUAL VISIT 90 capsule 4   metFORMIN (GLUCOPHAGE) 500 MG tablet Take 1 tablet (500 mg total) by mouth 2 (two) times daily with a meal. 60 tablet 0   methocarbamol (ROBAXIN) 750 MG tablet      mirabegron ER (MYRBETRIQ) 50 MG  TB24 tablet Take 50 mg by mouth daily.     Multiple Vitamins-Minerals (PRESERVISION AREDS 2 PO) Take 1 tablet by mouth 2 (two) times daily.     naproxen sodium (ALEVE) 220 MG tablet Take 220 mg by mouth 2 (two) times daily as needed (for pain.).     SYNTHROID 137 MCG tablet TAKE 1 TABLET DAILY 90 tablet 3   TOPROL XL 50 MG 24 hr tablet TAKE 1 TABLET DAILY 90 tablet 3   Vitamin D, Ergocalciferol, (DRISDOL) 1.25 MG (50000 UT) CAPS capsule Take 1 capsule (50,000 Units total) by mouth every 7 (seven) days. 13 capsule 0   No current facility-administered medications  on file prior to visit.     PAST MEDICAL HISTORY: Past Medical History:  Diagnosis Date   Arthritis    knees, back   Cancer ([place])    Carcinoid tumor of lung 2012   right found incidentally on chest xray eval for r/o vasxculitis    Carpal tunnel syndrome, bilateral    Coronary artery calcification 07/23/2016   Double vision    history, no current problem   Dysrhythmia    irregular occassional, no current problem   Frequent UTI    HNP (herniated nucleus pulposus), cervical 01/22/2011   C6-C7  Has seen dr Vertell Limber for this and Dr Tonita Cong  Positional numbness in hands no weakness     Hypertension    Hypothyroidism    Infertility, female    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  Download 08/2014:  Good compliance, breakthru apnea on 10cm.  Changed to auto 5-12cm >> good control of AHI on f/u download ONO on CPAP:       Osteoarthritis    PCOS (polycystic ovarian syndrome)    SUI (stress urinary incontinence, female)    h/o    PAST SURGICAL HISTORY: Past Surgical History:  Procedure Laterality Date   ANTERIOR CERVICAL DECOMP/DISCECTOMY FUSION  12/24/2017   Procedure: Cervical five-six Cervical six-seven Anterior cervical decompression/discectomy/fusion;  Surgeon: Erline Levine, MD;  Location: [place];  Service: Neurosurgery;;   CARPAL TUNNEL RELEASE Left 12/24/2017   Procedure: LEFT CARPAL TUNNEL RELEASE;  Surgeon: Erline Levine, MD;  Location: [place];  Service: Neurosurgery;  Laterality: Left;   CESAREAN SECTION     x 1 - twins   COLONOSCOPY     DILATATION & CURRETTAGE/HYSTEROSCOPY WITH RESECTOCOPE N/A 07/18/2013   Procedure: [place];  Surgeon: Lyman Speller, MD;  Location: [facility];  Service: Gynecology;  Laterality: N/A;  mass resection   Lung tumor removed     Rt lung carcinoid   MOUTH SURGERY     pre-cancerous ulcer removed     SHOULDER OPEN ROTATOR CUFF REPAIR  07/20/2010   Right   WISDOM TOOTH EXTRACTION      SOCIAL HISTORY: Social History   Tobacco Use   Smoking status: Never Smoker   Smokeless tobacco: Never Used  Substance Use Topics   Alcohol use: Not Currently   Drug use: No    FAMILY HISTORY: Family History  Problem Relation Age of Onset   Stroke Mother        died age 25   Hypertension Mother    Sudden death Mother    Alcoholism Mother    Coronary artery disease Father        died age 63   Hypertension Father    Liver disease Father  Alcohol abuse Father    Obesity Father    Parkinson's disease Brother    Diabetes Brother    Hypertension Brother    Hypertension Maternal Uncle    Colon cancer Neg Hx    Rectal cancer Neg Hx    Stomach cancer Neg Hx     ROS: Review of Systems  Constitutional: Negative for weight loss.  Endo/Heme/Allergies:       Positive for polyphagia    PHYSICAL EXAM: Blood pressure 125/76, pulse 62, temperature 98.1 F (36.7 C), temperature source Oral, height 4\' 11"  (1.499 m), weight 216 lb (98 kg), last menstrual period 02/12/2011, SpO2 97 %. Body mass index is 43.63 kg/m. Physical Exam Vitals signs reviewed.  Constitutional:      Appearance: Normal appearance. She is well-developed. She is obese.  Cardiovascular:     Rate and Rhythm: Normal rate.  Pulmonary:     Effort: Pulmonary effort is normal.  Musculoskeletal: Normal range of motion.  Skin:    General: Skin is warm and dry.  Neurological:     Mental Status: She is alert and oriented to person, place, and time.  Psychiatric:        Mood and Affect: Mood normal.        Behavior: Behavior normal.     RECENT LABS AND TESTS: BMET    Component Value Date/Time   NA 142 08/22/2019 0825   K 4.3 08/22/2019 0825   CL 102 08/22/2019 0825   CO2 26 08/22/2019 0825   GLUCOSE 106 (H) 08/22/2019 0825   GLUCOSE 100 (H) 06/07/2018 0739   BUN 23 08/22/2019 0825    CREATININE 0.68 08/22/2019 0825   CALCIUM 9.2 08/22/2019 0825   GFRNONAA 93 08/22/2019 0825   GFRAA 107 08/22/2019 0825   Lab Results  Component Value Date   HGBA1C 5.6 08/22/2019   HGBA1C 5.9 (H) 01/12/2019   HGBA1C 5.9 06/07/2018   Lab Results  Component Value Date   INSULIN 21.5 08/22/2019   INSULIN 20.4 01/12/2019   CBC    Component Value Date/Time   WBC 7.8 01/12/2019 1040   WBC 7.3 06/07/2018 0739   RBC 5.24 01/12/2019 1040   RBC 4.86 06/07/2018 0739   HGB 16.3 (H) 01/12/2019 1040   HGB 15.4 03/13/2014 1422   HCT 47.8 (H) 01/12/2019 1040   HCT 45.3 03/13/2014 1422   PLT 228.0 06/07/2018 0739   PLT 245 03/13/2014 1422   MCV 91 01/12/2019 1040   MCV 89.0 03/13/2014 1422   MCH 31.1 01/12/2019 1040   MCH 31.3 12/21/2017 1049   MCHC 34.1 01/12/2019 1040   MCHC 34.3 06/07/2018 0739   RDW 12.0 01/12/2019 1040   RDW 13.2 03/13/2014 1422   LYMPHSABS 2.3 01/12/2019 1040   LYMPHSABS 3.2 03/13/2014 1422   MONOABS 0.6 06/07/2018 0739   MONOABS 0.7 03/13/2014 1422   EOSABS 0.3 01/12/2019 1040   BASOSABS 0.1 01/12/2019 1040   BASOSABS 0.0 03/13/2014 1422   Iron/TIBC/Ferritin/ %Sat No results found for: IRON, TIBC, FERRITIN, IRONPCTSAT Lipid Panel     Component Value Date/Time   CHOL 130 08/22/2019 0825   CHOL 110 11/03/2016 0000   TRIG 55 08/22/2019 0825   TRIG 54 11/03/2016 0000   HDL 60 08/22/2019 0825   HDL 48 (L) 11/03/2016 0000   CHOLHDL 2 06/07/2018 0739   VLDL 10.8 06/07/2018 0739   LDLCALC 58 08/22/2019 0825   LDLCALC 49 11/03/2016 0000   Hepatic Function Panel     Component Value Date/Time  PROT 7.1 08/22/2019 0825   ALBUMIN 4.0 08/22/2019 0825   AST 21 08/22/2019 0825   ALT 25 08/22/2019 0825   ALKPHOS 81 08/22/2019 0825   BILITOT 0.5 08/22/2019 0825   BILIDIR 0.1 06/07/2018 0739   IBILI 0.4 09/08/2016 0749      Component Value Date/Time   TSH 1.220 08/22/2019 0825   TSH 2.490 01/12/2019 1040   TSH 3.66 06/07/2018 0739     Ref. Range  08/22/2019 08:25  Vitamin D, 25-Hydroxy Latest Ref Range: 30.0 - 100.0 ng/mL 58.0    OBESITY BEHAVIORAL INTERVENTION VISIT  Today's visit was # 15   Starting weight: 238 lbs Starting date: 01/12/2019 Today's weight : 216 lbs Today's date: 09/28/2019 Total lbs lost to date: 22  At least 15 minutes were spent on discussing the following behavioral intervention visit.     09/28/2019  Height 4\' 11"  (1.499 m)  Weight 216 lb (98 kg)  BMI (Calculated) 43.6  BLOOD PRESSURE - SYSTOLIC 212  BLOOD PRESSURE - DIASTOLIC 76   Body Fat % 24.8 %  Total Body Water (lbs) 78.4 lbs    ASK: We discussed the diagnosis of obesity with Nichole Smith today and Nichole Smith agreed to give [name] permission to discuss obesity behavioral modification therapy today.  ASSESS: Nichole Smith has the diagnosis of obesity and her BMI today is 43.6 Nichole Smith is in the action stage of change   ADVISE: Nichole Smith was educated on the multiple health risks of obesity as well as the benefit of weight loss to improve her health. She was advised of the need for long term treatment and the importance of lifestyle modifications to improve her current health and to decrease her risk of future health problems.  AGREE: Multiple dietary modification options and treatment options were discussed and  Nichole Smith agreed to follow the recommendations documented in the above note.  ARRANGE: Nichole Smith was educated on the importance of frequent visits to treat obesity as outlined per CMS and USPSTF guidelines and agreed to schedule her next follow up appointment today.  I, Doreene Nest, am acting as transcriptionist for [employer], FNP-C  I have reviewed the above documentation for accuracy and completeness, and I agree with the above.  - Kohei Antonellis, FNP-C.

## 2019-10-03 ENCOUNTER — Encounter (INDEPENDENT_AMBULATORY_CARE_PROVIDER_SITE_OTHER): Payer: Self-pay | Admitting: Family Medicine

## 2019-10-03 DIAGNOSIS — R7303 Prediabetes: Secondary | ICD-10-CM

## 2019-10-03 MED ORDER — METFORMIN HCL 500 MG PO TABS: 500.0000 mg | ORAL_TABLET | Freq: Two times a day (BID) | ORAL | 0 refills | Status: DC

## 2019-10-05 ENCOUNTER — Other Ambulatory Visit: Payer: Self-pay

## 2019-10-05 ENCOUNTER — Ambulatory Visit (INDEPENDENT_AMBULATORY_CARE_PROVIDER_SITE_OTHER): Payer: Medicare Other

## 2019-10-05 DIAGNOSIS — Z23 Encounter for immunization: Secondary | ICD-10-CM | POA: Diagnosis not present

## 2019-10-18 ENCOUNTER — Encounter (INDEPENDENT_AMBULATORY_CARE_PROVIDER_SITE_OTHER): Payer: Self-pay

## 2019-10-19 ENCOUNTER — Ambulatory Visit (INDEPENDENT_AMBULATORY_CARE_PROVIDER_SITE_OTHER): Payer: Medicare Other | Admitting: Family Medicine

## 2019-10-19 ENCOUNTER — Other Ambulatory Visit: Payer: Self-pay

## 2019-10-19 ENCOUNTER — Encounter (INDEPENDENT_AMBULATORY_CARE_PROVIDER_SITE_OTHER): Payer: Self-pay | Admitting: Family Medicine

## 2019-10-19 VITALS — BP 129/74 | HR 62 | Temp 98.4°F | Ht 59.0 in | Wt 215.0 lb

## 2019-10-19 DIAGNOSIS — Z6841 Body Mass Index (BMI) 40.0 and over, adult: Secondary | ICD-10-CM

## 2019-10-19 DIAGNOSIS — R7303 Prediabetes: Secondary | ICD-10-CM | POA: Diagnosis not present

## 2019-10-19 DIAGNOSIS — I10 Essential (primary) hypertension: Secondary | ICD-10-CM

## 2019-10-19 MED ORDER — METFORMIN HCL 500 MG PO TABS: 500.0000 mg | ORAL_TABLET | Freq: Two times a day (BID) | ORAL | 0 refills | Status: DC

## 2019-10-23 NOTE — Progress Notes (Signed)
Office: [phone]  /  Fax: [phone]   HPI:   Chief Complaint: OBESITY Nichole Smith is here to discuss her progress with her obesity treatment plan. She is on the keep a food journal with 1500 to 1600 calories and 90 grams of protein daily plan and she is following her eating plan approximately 80 % of the time. She states she is walking and swimming 7 times per week for a total of 245 minutes. [name] has socialized more over the last few weeks which has caused her to eat more and have more alcohol. She is now back on the plan. [name] has had some days that she has gone over with calories up to 2100 in  Day. Her weight is 215 lb (97.5 kg) today and has had a weight loss of 1 pound over a period of 3 weeks since her last visit. She has lost 23 lbs since starting treatment with [name].  Pre-Diabetes Nichole Smith has a diagnosis of prediabetes based on her elevated Hgb A1c and was informed this puts her at greater risk of developing diabetes. Nichole Smith reports polyphagia throughout the day. She is on metformin two times daily. Nichole Smith continues to work on diet and exercise to decrease risk of diabetes.  Lab Results  Component Value Date   HGBA1C 5.6 08/22/2019    Hypertension NAZARENE BUNNING is a 63 y.o. female with hypertension. Nichole Smith's blood pressure is well controlled. Nichole Smith denies chest pain or shortness of breath on exertion. She is working weight loss to help control her blood pressure with the goal of decreasing her risk of heart attack and stroke.  BP Readings from Last 3 Encounters:  09/28/19 125/76  09/21/19 130/72  08/22/19 118/77     ASSESSMENT AND PLAN:  Prediabetes - Plan: metFORMIN (GLUCOPHAGE) 500 MG tablet  Essential hypertension  Class 3 severe obesity with serious comorbidity and body mass index (BMI) of 40.0 to 44.9 in adult, unspecified obesity type ([place])  PLAN:  Pre-Diabetes Nichole Smith will continue to work on weight loss, exercise, and decreasing simple carbohydrates  in her diet to help decrease the risk of diabetes.  Nichole Smith agreed to continue metformin 500 mg BID #180 with no refills and follow up with [name] as directed to monitor her progress. We discussed starting Victoza and she will research the drug and consider it.    Hypertension We discussed sodium restriction, working on healthy weight loss, and a regular exercise program as the means to achieve improved blood pressure control. Nichole Smith agreed with this plan and agreed to follow up as directed. We will continue to monitor her blood pressure as well as her progress with the above lifestyle modifications. Nichole Smith will continue all of her medications as prescribed and she will watch for signs of hypotension as she continues her lifestyle modifications.  Obesity Nichole Smith is currently in the action stage of change. As such, her goal is to continue with weight loss efforts She has agreed to keep a food journal with 1500 to 1600 calories and 90 grams of protein daily Nichole Smith will continue her current exercise regimen for weight loss and overall health benefits. We discussed the following Behavioral Modification Strategies today: planning for success, keep a strict food journal, increasing lean protein intake and decreasing simple carbohydrates   Holiday recipes were provided to patient today.  Nichole Smith has agreed to follow up with our clinic in 3 weeks. She was informed of the importance of frequent follow up visits to maximize her success with intensive  lifestyle modifications for her multiple health conditions.  ALLERGIES: Allergies  Allergen Reactions  . Nichole Smith Other (See Comments)    Inflammation--eye drop   . Ace Inhibitors Cough    MEDICATIONS: Current Outpatient Medications on File Prior to Visit  Medication Sig Dispense Refill  . aspirin EC 81 MG tablet Take 81 mg by mouth at bedtime.    Nichole Smith atorvastatin (LIPITOR) 40 MG tablet TAKE 1 TABLET DAILY 90 tablet 3  . CALCIUM PO Take 1 tablet by mouth at  bedtime.     Nichole Smith diltiazem (CARDIZEM CD) 180 MG 24 hr capsule Take 1 capsule (180 mg total) by mouth at bedtime. 90 capsule 3  . estradiol (ESTRACE VAGINAL) 0.1 MG/GM vaginal cream INSERT 1 GM VAGINALLY TWICE WEEKLY 42.5 g 3  . hydrochlorothiazide (MICROZIDE) 12.5 MG capsule TAKE 1 CAPSULE DAILY.   SCHEDULE ANNUAL VISIT 90 capsule 3  . methocarbamol (ROBAXIN) 750 MG tablet     . mirabegron ER (MYRBETRIQ) 50 MG TB24 tablet Take 50 mg by mouth daily.    . Multiple Vitamins-Minerals (PRESERVISION AREDS 2 PO) Take 1 tablet by mouth 2 (two) times daily.    . naproxen sodium (ALEVE) 220 MG tablet Take 220 mg by mouth 2 (two) times daily as needed (for pain.).    Nichole Smith SYNTHROID 137 MCG tablet TAKE 1 TABLET DAILY 90 tablet 3  . TOPROL XL 50 MG 24 hr tablet TAKE 1 TABLET DAILY 90 tablet 3  . Vitamin D, Ergocalciferol, (DRISDOL) 1.25 MG (50000 UT) CAPS capsule Take 1 capsule (50,000 Units total) by mouth every 7 (seven) days. 13 capsule 0   No current facility-administered medications on file prior to visit.     PAST MEDICAL HISTORY: Past Medical History:  Diagnosis Date  . Arthritis    knees, back  . Cancer ([place])   . Carcinoid tumor of lung 2012   right found incidentally on chest xray eval for r/o vasxculitis   . Carpal tunnel syndrome, bilateral   . Coronary artery calcification 07/23/2016  . Double vision    history, no current problem  . Dysrhythmia    irregular occassional, no current problem  . Frequent UTI   . HNP (herniated nucleus pulposus), cervical 01/22/2011   C6-C7  Has seen dr Vertell Limber for this and Dr Tonita Cong  Positional numbness in hands no weakness    . Hypertension   . Hypothyroidism   . Infertility, female   . 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 Obstructive sleep apnea 07/27/2014   NPSG 07/2014:  AHI 85/hr, optimal cpap 10cm.  Download 08/2014:  Good compliance, breakthru apnea on 10cm.  Changed to auto 5-12cm >> good control of AHI on f/u download ONO on CPAP:      .  Osteoarthritis   . PCOS (polycystic ovarian syndrome)   . SUI (stress urinary incontinence, female)    h/o    PAST SURGICAL HISTORY: Past Surgical History:  Procedure Laterality Date  . ANTERIOR CERVICAL DECOMP/DISCECTOMY FUSION  12/24/2017   Procedure: Cervical five-six Cervical six-seven Anterior cervical decompression/discectomy/fusion;  Surgeon: Erline Levine, MD;  Location: [facility];  Service: Neurosurgery;;  . Wilmon Pali RELEASE Left 12/24/2017   Procedure: LEFT CARPAL TUNNEL RELEASE;  Surgeon: Erline Levine, MD;  Location: [facility];  Service: Neurosurgery;  Laterality: Left;  . CESAREAN SECTION     x 1 - twins  . COLONOSCOPY    . DILATATION & CURRETTAGE/HYSTEROSCOPY WITH RESECTOCOPE N/A 07/18/2013   Procedure: DILATATION & CURETTAGE/HYSTEROSCOPY WITH RESECTOCOPE;  Surgeon:  Lyman Speller, MD;  Location: [facility];  Service: Gynecology;  Laterality: N/A;  mass resection  . Lung tumor removed     Rt lung carcinoid  . MOUTH SURGERY     pre-cancerous ulcer removed  . SHOULDER OPEN ROTATOR CUFF REPAIR  07/20/2010   Right  . WISDOM TOOTH EXTRACTION      SOCIAL HISTORY: Social History   Tobacco Use  . Smoking status: Never Smoker  . Smokeless tobacco: Never Used  Substance Use Topics  . Alcohol use: Not Currently  . Drug use: No    FAMILY HISTORY: Family History  Problem Relation Age of Onset  . Stroke Mother        died age 73  . Hypertension Mother   . Sudden death Mother   . Alcoholism Mother   . Coronary artery disease Father        died age 31  . Hypertension Father   . Liver disease Father   . Alcohol abuse Father   . Obesity Father   . Parkinson's disease Brother   . Diabetes Brother   . Hypertension Brother   . Hypertension Maternal Uncle   . Colon cancer Neg Hx   . Rectal cancer Neg Hx   . Stomach cancer Neg Hx     ROS: Review of Systems  Constitutional: Positive for weight loss.  Respiratory: Negative for shortness of breath (on exertion).    Cardiovascular: Negative for chest pain.  Endo/Heme/Allergies:       Positive for polyphagia    PHYSICAL EXAM: Pulse 62, temperature 98.4 F (36.9 C), temperature source Oral, height 4\' 11"  (1.499 m), weight 215 lb (97.5 kg), last menstrual period 02/12/2011, SpO2 96 %. Body mass index is 43.42 kg/m. Physical Exam Vitals signs reviewed.  Constitutional:      Appearance: Normal appearance. She is well-developed. She is obese.  Cardiovascular:     Rate and Rhythm: Normal rate.  Pulmonary:     Effort: Pulmonary effort is normal.  Musculoskeletal: Normal range of motion.  Skin:    General: Skin is warm and dry.  Neurological:     Mental Status: She is alert and oriented to person, place, and time.  Psychiatric:        Mood and Affect: Mood normal.        Behavior: Behavior normal.     RECENT LABS AND TESTS: BMET    Component Value Date/Time   NA 142 08/22/2019 0825   K 4.3 08/22/2019 0825   CL 102 08/22/2019 0825   CO2 26 08/22/2019 0825   GLUCOSE 106 (H) 08/22/2019 0825   GLUCOSE 100 (H) 06/07/2018 0739   BUN 23 08/22/2019 0825   CREATININE 0.68 08/22/2019 0825   CALCIUM 9.2 08/22/2019 0825   GFRNONAA 93 08/22/2019 0825   GFRAA 107 08/22/2019 0825   Lab Results  Component Value Date   HGBA1C 5.6 08/22/2019   HGBA1C 5.9 (H) 01/12/2019   HGBA1C 5.9 06/07/2018   Lab Results  Component Value Date   INSULIN 21.5 08/22/2019   INSULIN 20.4 01/12/2019   CBC    Component Value Date/Time   WBC 7.8 01/12/2019 1040   WBC 7.3 06/07/2018 0739   RBC 5.24 01/12/2019 1040   RBC 4.86 06/07/2018 0739   HGB 16.3 (H) 01/12/2019 1040   HGB 15.4 03/13/2014 1422   HCT 47.8 (H) 01/12/2019 1040   HCT 45.3 03/13/2014 1422   PLT 228.0 06/07/2018 0739   PLT 245 03/13/2014 1422  MCV 91 01/12/2019 1040   MCV 89.0 03/13/2014 1422   MCH 31.1 01/12/2019 1040   MCH 31.3 12/21/2017 1049   MCHC 34.1 01/12/2019 1040   MCHC 34.3 06/07/2018 0739   RDW 12.0 01/12/2019 1040   RDW  13.2 03/13/2014 1422   LYMPHSABS 2.3 01/12/2019 1040   LYMPHSABS 3.2 03/13/2014 1422   MONOABS 0.6 06/07/2018 0739   MONOABS 0.7 03/13/2014 1422   EOSABS 0.3 01/12/2019 1040   BASOSABS 0.1 01/12/2019 1040   BASOSABS 0.0 03/13/2014 1422   Iron/TIBC/Ferritin/ %Sat No results found for: IRON, TIBC, FERRITIN, IRONPCTSAT Lipid Panel     Component Value Date/Time   CHOL 130 08/22/2019 0825   CHOL 110 11/03/2016 0000   TRIG 55 08/22/2019 0825   TRIG 54 11/03/2016 0000   HDL 60 08/22/2019 0825   HDL 48 (L) 11/03/2016 0000   CHOLHDL 2 06/07/2018 0739   VLDL 10.8 06/07/2018 0739   LDLCALC 58 08/22/2019 0825   LDLCALC 49 11/03/2016 0000   Hepatic Function Panel     Component Value Date/Time   PROT 7.1 08/22/2019 0825   ALBUMIN 4.0 08/22/2019 0825   AST 21 08/22/2019 0825   ALT 25 08/22/2019 0825   ALKPHOS 81 08/22/2019 0825   BILITOT 0.5 08/22/2019 0825   BILIDIR 0.1 06/07/2018 0739   IBILI 0.4 09/08/2016 0749      Component Value Date/Time   TSH 1.220 08/22/2019 0825   TSH 2.490 01/12/2019 1040   TSH 3.66 06/07/2018 0739     Ref. Range 08/22/2019 08:25  Vitamin D, 25-Hydroxy Latest Ref Range: 30.0 - 100.0 ng/mL 58.0    OBESITY BEHAVIORAL INTERVENTION VISIT  Today's visit was # 16   Starting weight: 238 lbs Starting date: 01/12/2019 Today's weight : 215 lbs Today's date: 10/19/2019 Total lbs lost to date: 23    10/19/2019  Height 4\' 11"  (1.499 m)  Weight 215 lb (97.5 kg)  BMI (Calculated) 43.4   Body Fat % 51.3 %  Total Body Water (lbs) 77.4 lbs    ASK: We discussed the diagnosis of obesity with Janifer Adie today and Shabana agreed to give [name] permission to discuss obesity behavioral modification therapy today.  ASSESS: Abbigaile has the diagnosis of obesity and her BMI today is 43.4 Lyndal is in the action stage of change   ADVISE: Dicy was educated on the multiple health risks of obesity as well as the benefit of weight loss to improve her health. She was  advised of the need for long term treatment and the importance of lifestyle modifications to improve her current health and to decrease her risk of future health problems.  AGREE: Multiple dietary modification options and treatment options were discussed and  Karmel agreed to follow the recommendations documented in the above note.  ARRANGE: Tyja was educated on the importance of frequent visits to treat obesity as outlined per CMS and USPSTF guidelines and agreed to schedule her next follow up appointment today.  Corey Skains, am acting as Location manager for [employer], FNP-C.  I have reviewed the above documentation for accuracy and completeness, and I agree with the above.  - Blenda Wisecup, FNP-C.

## 2019-10-24 ENCOUNTER — Encounter (INDEPENDENT_AMBULATORY_CARE_PROVIDER_SITE_OTHER): Payer: Self-pay | Admitting: Family Medicine

## 2019-10-25 ENCOUNTER — Other Ambulatory Visit: Payer: Self-pay | Admitting: Cardiology

## 2019-10-30 ENCOUNTER — Encounter (INDEPENDENT_AMBULATORY_CARE_PROVIDER_SITE_OTHER): Payer: Self-pay | Admitting: Family Medicine

## 2019-10-31 NOTE — Telephone Encounter (Signed)
Please advise 

## 2019-11-10 ENCOUNTER — Encounter (INDEPENDENT_AMBULATORY_CARE_PROVIDER_SITE_OTHER): Payer: Self-pay | Admitting: Family Medicine

## 2019-11-10 ENCOUNTER — Telehealth (INDEPENDENT_AMBULATORY_CARE_PROVIDER_SITE_OTHER): Payer: Medicare Other | Admitting: Family Medicine

## 2019-11-10 ENCOUNTER — Other Ambulatory Visit: Payer: Self-pay

## 2019-11-10 DIAGNOSIS — Z6841 Body Mass Index (BMI) 40.0 and over, adult: Secondary | ICD-10-CM

## 2019-11-10 DIAGNOSIS — R7303 Prediabetes: Secondary | ICD-10-CM

## 2019-11-14 NOTE — Progress Notes (Signed)
Office: [phone]  /  Fax: [phone] TeleHealth Visit:  Nichole Smith has verbally consented to this TeleHealth visit today. The patient is located at home, the provider is located at the [facility] office. The participants in this visit include the listed provider and patient and any and all parties involved. The visit was conducted today via FaceTime.  HPI:  Chief Complaint: OBESITY Nichole Smith is here to discuss her progress with her obesity treatment plan. She is on the keep a food journal with 1500 to 1600 calories and 100 grams of protein daily plan and states she is following her eating plan approximately 80 % of the time. She states she is walking and swimming 30 minutes 4 times per week.  Nichole Smith is eating about 1700 calories a day. She is struggling with having sweets in the home from baking cookies. She had also had some pasta recently. (Weight 215 lbs. 11/10/19).  Pre-Diabetes Nichole Smith has a diagnosis of prediabetes. She is still considering the Victoza as we previously discussed. She has increased Metformin to one in the morning and two at lunch. She denies nausea or vomiting. She admits to flatulence with Metformin.   Lab Results  Component Value Date   HGBA1C 5.6 08/22/2019    ASSESSMENT AND PLAN:  Prediabetes  Class 3 severe obesity with serious comorbidity and body mass index (BMI) of 40.0 to 44.9 in adult, unspecified obesity type ([place])  PLAN:  Pre-Diabetes Nichole Smith may increase Metformin to one in the morning and two at lunch (500 mg). No refill is needed. She will continue to work on weight loss, exercise, and decreasing simple carbohydrates to help decrease the risk of diabetes.   Obesity Nichole Smith is currently in the action stage of change. As such, her goal is to continue with weight loss efforts She has agreed to keep a food journal with 1500 to 1600 calories and 100 grams of protein daily Nichole Smith will continue current exercise regimen for weight loss  and overall health benefits. We discussed the following Behavioral Modification Strategies today: planning for success, celebration eating strategies, decreasing simple carbohydrates  and avoiding temptations  We discussed starting back on the Category 3 plan after the first of the year. We also discussed the low carb plan. Handout for low carb was sent to patient via MyChart.  Nichole Smith has agreed to follow up with our clinic in 3 weeks. She was informed of the importance of frequent follow up visits to maximize her success with intensive lifestyle modifications for her multiple health conditions.  ALLERGIES: Allergies  Allergen Reactions  . Neomycin Other (See Comments)    Inflammation--eye drop   . Ace Inhibitors Cough    MEDICATIONS: Current Outpatient Medications on File Prior to Visit  Medication Sig Dispense Refill  . aspirin EC 81 MG tablet Take 81 mg by mouth at bedtime.    [name] atorvastatin (LIPITOR) 40 MG tablet TAKE 1 TABLET DAILY 90 tablet 3  . CALCIUM PO Take 1 tablet by mouth at bedtime.     [name] diltiazem (CARDIZEM CD) 180 MG 24 hr capsule Take 1 capsule (180 mg total) by mouth at bedtime. Please keep upcoming appt in December with Dr. Radford Pax for future refills. Thank you 90 capsule 0  . estradiol (ESTRACE VAGINAL) 0.1 MG/GM vaginal cream INSERT 1 GM VAGINALLY TWICE WEEKLY 42.5 g 3  . hydrochlorothiazide (MICROZIDE) 12.5 MG capsule TAKE 1 CAPSULE DAILY.   SCHEDULE ANNUAL VISIT 90 capsule 3  . metFORMIN (GLUCOPHAGE) 500 MG  tablet Take 1 tablet (500 mg total) by mouth 2 (two) times daily with a meal. 180 tablet 0  . methocarbamol (ROBAXIN) 750 MG tablet     . mirabegron ER (MYRBETRIQ) 50 MG TB24 tablet Take 50 mg by mouth daily.    . Multiple Vitamins-Minerals (PRESERVISION AREDS 2 PO) Take 1 tablet by mouth 2 (two) times daily.    . naproxen sodium (ALEVE) 220 MG tablet Take 220 mg by mouth 2 (two) times daily as needed (for pain.).    [name] SYNTHROID 137 MCG tablet TAKE 1 TABLET DAILY  90 tablet 3  . TOPROL XL 50 MG 24 hr tablet TAKE 1 TABLET DAILY 90 tablet 3  . Vitamin D, Ergocalciferol, (DRISDOL) 1.25 MG (50000 UT) CAPS capsule Take 1 capsule (50,000 Units total) by mouth every 7 (seven) days. 13 capsule 0   No current facility-administered medications on file prior to visit.    PAST MEDICAL HISTORY: Past Medical History:  Diagnosis Date  . Arthritis    knees, back  . Cancer ([place])   . Carcinoid tumor of lung 2012   right found incidentally on chest xray eval for r/o vasxculitis   . Carpal tunnel syndrome, bilateral   . Coronary artery calcification 07/23/2016  . Double vision    history, no current problem  . Dysrhythmia    irregular occassional, no current problem  . Frequent UTI   . HNP (herniated nucleus pulposus), cervical 01/22/2011   C6-C7  Has seen dr Vertell Limber for this and Dr Tonita Cong  Positional numbness in hands no weakness    . Hypertension   . Hypothyroidism   . Infertility, female   . 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 Obstructive sleep apnea 07/27/2014   NPSG 07/2014:  AHI 85/hr, optimal cpap 10cm.  Download 08/2014:  Good compliance, breakthru apnea on 10cm.  Changed to auto 5-12cm >> good control of AHI on f/u download ONO on CPAP:      . Osteoarthritis   . PCOS (polycystic ovarian syndrome)   . SUI (stress urinary incontinence, female)    h/o    PAST SURGICAL HISTORY: Past Surgical History:  Procedure Laterality Date  . ANTERIOR CERVICAL DECOMP/DISCECTOMY FUSION  12/24/2017   Procedure: Cervical five-six Cervical six-seven Anterior cervical decompression/discectomy/fusion;  Surgeon: Erline Levine, MD;  Location: [facility];  Service: Neurosurgery;;  . Wilmon Pali RELEASE Left 12/24/2017   Procedure: LEFT CARPAL TUNNEL RELEASE;  Surgeon: Erline Levine, MD;  Location: [facility];  Service: Neurosurgery;  Laterality: Left;  . CESAREAN SECTION     x 1 - twins  . COLONOSCOPY    . DILATATION & CURRETTAGE/HYSTEROSCOPY WITH RESECTOCOPE N/A 07/18/2013    Procedure: DILATATION & CURETTAGE/HYSTEROSCOPY WITH RESECTOCOPE;  Surgeon: Lyman Speller, MD;  Location: [facility];  Service: Gynecology;  Laterality: N/A;  mass resection  . Lung tumor removed     Rt lung carcinoid  . MOUTH SURGERY     pre-cancerous ulcer removed  . SHOULDER OPEN ROTATOR CUFF REPAIR  07/20/2010   Right  . WISDOM TOOTH EXTRACTION      SOCIAL HISTORY: Social History   Tobacco Use  . Smoking status: Never Smoker  . Smokeless tobacco: Never Used  Substance Use Topics  . Alcohol use: Not Currently  . Drug use: No    FAMILY HISTORY: Family History  Problem Relation Age of Onset  . Stroke Mother        died age 38  . Hypertension Mother   . Sudden death  Mother   . Alcoholism Mother   . Coronary artery disease Father        died age 25  . Hypertension Father   . Liver disease Father   . Alcohol abuse Father   . Obesity Father   . Parkinson's disease Brother   . Diabetes Brother   . Hypertension Brother   . Hypertension Maternal Uncle   . Colon cancer Neg Hx   . Rectal cancer Neg Hx   . Stomach cancer Neg Hx     ROS: Review of Systems  Constitutional: Negative for weight loss.  Gastrointestinal: Negative for nausea and vomiting.       Positive for flatulence    PHYSICAL EXAM: Last menstrual period 02/12/2011. There is no height or weight on file to calculate BMI. Physical Exam  RECENT LABS AND TESTS: BMET    Component Value Date/Time   NA 142 08/22/2019 0825   K 4.3 08/22/2019 0825   CL 102 08/22/2019 0825   CO2 26 08/22/2019 0825   GLUCOSE 106 (H) 08/22/2019 0825   GLUCOSE 100 (H) 06/07/2018 0739   BUN 23 08/22/2019 0825   CREATININE 0.68 08/22/2019 0825   CALCIUM 9.2 08/22/2019 0825   GFRNONAA 93 08/22/2019 0825   GFRAA 107 08/22/2019 0825   Lab Results  Component Value Date   HGBA1C 5.6 08/22/2019   HGBA1C 5.9 (H) 01/12/2019   HGBA1C 5.9 06/07/2018   Lab Results  Component Value Date   INSULIN 21.5 08/22/2019   INSULIN  20.4 01/12/2019   CBC    Component Value Date/Time   WBC 7.8 01/12/2019 1040   WBC 7.3 06/07/2018 0739   RBC 5.24 01/12/2019 1040   RBC 4.86 06/07/2018 0739   HGB 16.3 (H) 01/12/2019 1040   HGB 15.4 03/13/2014 1422   HCT 47.8 (H) 01/12/2019 1040   HCT 45.3 03/13/2014 1422   PLT 228.0 06/07/2018 0739   PLT 245 03/13/2014 1422   MCV 91 01/12/2019 1040   MCV 89.0 03/13/2014 1422   MCH 31.1 01/12/2019 1040   MCH 31.3 12/21/2017 1049   MCHC 34.1 01/12/2019 1040   MCHC 34.3 06/07/2018 0739   RDW 12.0 01/12/2019 1040   RDW 13.2 03/13/2014 1422   LYMPHSABS 2.3 01/12/2019 1040   LYMPHSABS 3.2 03/13/2014 1422   MONOABS 0.6 06/07/2018 0739   MONOABS 0.7 03/13/2014 1422   EOSABS 0.3 01/12/2019 1040   BASOSABS 0.1 01/12/2019 1040   BASOSABS 0.0 03/13/2014 1422   Iron/TIBC/Ferritin/ %Sat No results found for: IRON, TIBC, FERRITIN, IRONPCTSAT Lipid Panel     Component Value Date/Time   CHOL 130 08/22/2019 0825   CHOL 110 11/03/2016 0000   TRIG 55 08/22/2019 0825   TRIG 54 11/03/2016 0000   HDL 60 08/22/2019 0825   HDL 48 (L) 11/03/2016 0000   CHOLHDL 2 06/07/2018 0739   VLDL 10.8 06/07/2018 0739   LDLCALC 58 08/22/2019 0825   LDLCALC 49 11/03/2016 0000   Hepatic Function Panel     Component Value Date/Time   PROT 7.1 08/22/2019 0825   ALBUMIN 4.0 08/22/2019 0825   AST 21 08/22/2019 0825   ALT 25 08/22/2019 0825   ALKPHOS 81 08/22/2019 0825   BILITOT 0.5 08/22/2019 0825   BILIDIR 0.1 06/07/2018 0739   IBILI 0.4 09/08/2016 0749      Component Value Date/Time   TSH 1.220 08/22/2019 0825   TSH 2.490 01/12/2019 1040   TSH 3.66 06/07/2018 0739     OBESITY BEHAVIORAL INTERVENTION VISIT DOCUMENTATION  FOR INSURANCE (~15 minutes)    ASK: We discussed the diagnosis of obesity with Nichole Smith today and Nichole Smith agreed to give [name] permission to discuss obesity behavioral modification therapy today.  ASSESS: Nichole Smith has the diagnosis of obesity  Nichole Smith is in the action  stage of change   ADVISE: Nichole Smith was educated on the multiple health risks of obesity as well as the benefit of weight loss to improve her health. She was advised of the need for long term treatment and the importance of lifestyle modifications to improve her current health and to decrease her risk of future health problems.  AGREE: Multiple dietary modification options and treatment options were discussed and  Nichole Smith agreed to follow the recommendations documented in the above note.  ARRANGE: Nichole Smith was educated on the importance of frequent visits to treat obesity as outlined per CMS and USPSTF guidelines and agreed to schedule her next follow up appointment today.   Corey Skains, am acting as Location manager for [employer], FNP-C.  I have reviewed the above documentation for accuracy and completeness, and I agree with the above.  - Caedyn Raygoza, FNP-C.

## 2019-11-15 ENCOUNTER — Telehealth (INDEPENDENT_AMBULATORY_CARE_PROVIDER_SITE_OTHER): Payer: Medicare Other | Admitting: Family Medicine

## 2019-11-16 ENCOUNTER — Ambulatory Visit: Payer: Medicare Other | Admitting: Cardiology

## 2019-11-22 ENCOUNTER — Other Ambulatory Visit: Payer: Self-pay | Admitting: Cardiology

## 2019-12-02 HISTORY — PX: COLONOSCOPY: SHX174

## 2019-12-08 ENCOUNTER — Ambulatory Visit (INDEPENDENT_AMBULATORY_CARE_PROVIDER_SITE_OTHER): Payer: Medicare Other | Admitting: Family Medicine

## 2019-12-08 ENCOUNTER — Encounter (INDEPENDENT_AMBULATORY_CARE_PROVIDER_SITE_OTHER): Payer: Self-pay | Admitting: Family Medicine

## 2019-12-08 ENCOUNTER — Other Ambulatory Visit: Payer: Self-pay

## 2019-12-08 VITALS — BP 124/76 | HR 59 | Temp 98.2°F | Ht 59.0 in | Wt 218.0 lb

## 2019-12-08 DIAGNOSIS — E559 Vitamin D deficiency, unspecified: Secondary | ICD-10-CM

## 2019-12-08 DIAGNOSIS — Z6841 Body Mass Index (BMI) 40.0 and over, adult: Secondary | ICD-10-CM | POA: Diagnosis not present

## 2019-12-08 DIAGNOSIS — R7303 Prediabetes: Secondary | ICD-10-CM | POA: Diagnosis not present

## 2019-12-08 MED ORDER — METFORMIN HCL 500 MG PO TABS: 500.0000 mg | ORAL_TABLET | Freq: Three times a day (TID) | ORAL | 0 refills | Status: DC

## 2019-12-08 MED ORDER — VITAMIN D (ERGOCALCIFEROL) 1.25 MG (50000 UNIT) PO CAPS: 50000.0000 [IU] | ORAL_CAPSULE | ORAL | 0 refills | Status: DC

## 2019-12-13 NOTE — Progress Notes (Signed)
Chief Complaint:   OBESITY Nichole Smith is here to discuss her progress with her obesity treatment plan along with follow-up of her obesity related diagnoses. Nichole Smith is keeping a food journal of 1500 calories and 100 grams of protein daily.Nichole Smith states she is walking and swimming 230 minutes weekly.  Today's visit was #: 18 Starting weight: 238 lbs Starting date: 01/12/2019 Today's weight: 218 lbs Today's date: 12/08/2019 Total lbs lost to date: 20 Total lbs lost since last in-office visit: 0  Interim History:  was off the plan over the holidays. She reports she tended to graze because treats were available. She reports being very much back on the plan the last three days. She does admit to some self sabotage which she is working on. She would like to eat more plant-based foods.  Subjective:   Prediabetes  Nichole Smith reports polyphagia is better this week. She did notice increased cravings for carbs over the holidays.  Vitamin D deficiency Nichole Smith's vitamin D level is at goal. She is still on prescription vitamin D weekly.   Assessment/Plan:   Prediabetes  Nichole Smith will continue to work on weight loss, exercise, and decreasing simple carbohydrates to help decrease the risk of diabetes. Nichole Smith agrees to continue metformin 500 mg three times daily with meals #270 with no refills. We will check A1c at the next visit.  Vitamin D deficiency  Low Vitamin D level contributes to fatigue and are associated with obesity, breast, and colon cancer. Nichole Smith agrees to continue to take prescription Vitamin D @50 ,000 IU every week #4 with no refills and she will follow-up for routine testing of vitamin D, at least 2-3 times per year to avoid over-replacement. We will check vitamin D level at the next visit.  Obesity Nichole Smith is currently in the action stage of change. As such, her goal is to continue with weight loss efforts. She has agreed to on the Category 3 Plan or keeping a food journal and  adhering to recommended goals of 1500 calories and 100 grams of protein daily.   Nichole Smith will continue her current exercise regimen, plus add resistance exercise 2 to 3 days per week.  We discussed the following behavioral modification strategies today: decreasing simple carbohydrates and planning for success. I provided vegetarian plan as a guide. We discussed high protein vegetarian foods.  Nichole Smith has agreed to follow-up with our clinic in 3 weeks. She was informed of the importance of frequent follow-up visits to maximize her success with intensive lifestyle modifications for her multiple health conditions.   Objective:   Blood pressure 124/76, pulse (!) 59, temperature 98.2 F (36.8 C), temperature source Oral, height 4\' 11"  (1.499 m), weight 218 lb (98.9 kg), last menstrual period 02/12/2011, SpO2 96 %. Body mass index is 44.03 kg/m.  General: Cooperative, alert, well developed, in no acute distress. HEENT: Conjunctivae and lids unremarkable. Neck: No thyromegaly.  Cardiovascular: Regular rhythm.  Lungs: Normal work of breathing. Extremities: No edema.  Neurologic: No focal deficits.   Lab Results  Component Value Date   CREATININE 0.68 08/22/2019   BUN 23 08/22/2019   NA 142 08/22/2019   K 4.3 08/22/2019   CL 102 08/22/2019   CO2 26 08/22/2019   Lab Results  Component Value Date   ALT 25 08/22/2019   AST 21 08/22/2019   ALKPHOS 81 08/22/2019   BILITOT 0.5 08/22/2019   Lab Results  Component Value Date   HGBA1C 5.6 08/22/2019   HGBA1C 5.9 (  H) 01/12/2019   HGBA1C 5.9 06/07/2018   Lab Results  Component Value Date   INSULIN 21.5 08/22/2019   INSULIN 20.4 01/12/2019   Lab Results  Component Value Date   TSH 1.220 08/22/2019   Lab Results  Component Value Date   CHOL 130 08/22/2019   HDL 60 08/22/2019   LDLCALC 58 08/22/2019   TRIG 55 08/22/2019   CHOLHDL 2 06/07/2018   Lab Results  Component Value Date   WBC 7.8 01/12/2019   HGB 16.3 (H)  01/12/2019   HCT 47.8 (H) 01/12/2019   MCV 91 01/12/2019   PLT 228.0 06/07/2018   No results found for: IRON, TIBC, FERRITIN  Obesity Behavioral Intervention Documentation for Insurance:   Approximately 15 minutes were spent on the discussion below.  ASK: We discussed the diagnosis of obesity with Nichole Smith today and Nichole Smith agreed to give [name] permission to discuss obesity behavioral modification therapy today.  ASSESS: Nichole Smith has the diagnosis of obesity and her BMI today is 44.01Marcie Smith is in the action stage of change.   ADVISE: Nichole Smith was educated on the multiple health risks of obesity as well as the benefit of weight loss to improve her health. She was advised of the need for long term treatment and the importance of lifestyle modifications to improve her current health and to decrease her risk of future health problems.  AGREE: Multiple dietary modification options and treatment options were discussed and Nichole Smith agreed to follow the recommendations documented in the above note.  ARRANGE: Nichole Smith was educated on the importance of frequent visits to treat obesity as outlined per CMS and USPSTF guidelines and agreed to schedule her next follow up appointment today.  Attestation Statements:   Reviewed by clinician on day of visit: allergies, medications, problem list, medical history, surgical history, family history, social history, and previous encounter notes.  Nichole Smith, am acting as Location manager for Nichole Schwab, FNP-C.  I have reviewed the above documentation for accuracy and completeness, and I agree with the above. -  Nichole Fick, FNP

## 2019-12-14 ENCOUNTER — Encounter (INDEPENDENT_AMBULATORY_CARE_PROVIDER_SITE_OTHER): Payer: Self-pay | Admitting: Family Medicine

## 2019-12-26 ENCOUNTER — Ambulatory Visit: Payer: Medicare Other | Attending: Internal Medicine

## 2019-12-26 ENCOUNTER — Other Ambulatory Visit: Payer: Medicare Other

## 2019-12-26 DIAGNOSIS — Z20822 Contact with and (suspected) exposure to covid-19: Secondary | ICD-10-CM

## 2019-12-27 LAB — NOVEL CORONAVIRUS, NAA: SARS-CoV-2, NAA: NOT DETECTED

## 2019-12-29 ENCOUNTER — Ambulatory Visit (INDEPENDENT_AMBULATORY_CARE_PROVIDER_SITE_OTHER): Payer: Medicare Other | Admitting: Family Medicine

## 2019-12-31 ENCOUNTER — Ambulatory Visit: Payer: Medicare Other

## 2020-01-02 ENCOUNTER — Other Ambulatory Visit: Payer: Self-pay

## 2020-01-02 ENCOUNTER — Ambulatory Visit (INDEPENDENT_AMBULATORY_CARE_PROVIDER_SITE_OTHER): Payer: Medicare Other | Admitting: Physician Assistant

## 2020-01-02 ENCOUNTER — Encounter (INDEPENDENT_AMBULATORY_CARE_PROVIDER_SITE_OTHER): Payer: Self-pay | Admitting: Physician Assistant

## 2020-01-02 VITALS — BP 134/76 | HR 53 | Temp 98.4°F | Ht 59.0 in | Wt 212.0 lb

## 2020-01-02 DIAGNOSIS — Z6841 Body Mass Index (BMI) 40.0 and over, adult: Secondary | ICD-10-CM | POA: Diagnosis not present

## 2020-01-02 DIAGNOSIS — E7849 Other hyperlipidemia: Secondary | ICD-10-CM

## 2020-01-02 DIAGNOSIS — E038 Other specified hypothyroidism: Secondary | ICD-10-CM

## 2020-01-02 DIAGNOSIS — R7303 Prediabetes: Secondary | ICD-10-CM

## 2020-01-02 DIAGNOSIS — E559 Vitamin D deficiency, unspecified: Secondary | ICD-10-CM

## 2020-01-02 NOTE — Progress Notes (Signed)
Chief Complaint:   OBESITY Nichole Smith is here to discuss her progress with her obesity treatment plan along with follow-up of her obesity related diagnoses. Nichole Smith is journaling/Category 3 and states she is following her eating plan approximately 95% of the time. Nichole Smith states she is walking 30 minutes 7 times per week.  Today's visit was #: 29 Starting weight: 238 lbs Starting date: 01/12/2019 Today's weight: 212 lbs Today's date: 01/02/2020 Total lbs lost to date: 26 Total lbs lost since last in-office visit: 6  Interim History: Nichole Smith was able to get back on track with her plan over the last few weeks. She is exceeding her protein goal daily and staying around 1600 calories.  Subjective:   Prediabetes. Meggan has a diagnosis of prediabetes based on her elevated HgA1c and was informed this puts her at greater risk of developing diabetes. She continues to work on diet and exercise to decrease her risk of diabetes. She denies nausea, vomiting, or diarrhea. Nichole Smith is on metformin. She reports evening hunger, but is not taking her metformin after dinner.  Lab Results  Component Value Date   HGBA1C 5.6 08/22/2019   Lab Results  Component Value Date   INSULIN 21.5 08/22/2019   INSULIN 20.4 01/12/2019   Vitamin D deficiency. Nichole Smith is on Vitamin D. No nausea, vomiting, or muscle weakness. Last Vitamin D level was 58.0 on 08/22/2019.  Other hyperlipidemia. Nichole Smith is on atorvastatin. No chest pain or myalgias. Last labs were within normal range.   Lab Results  Component Value Date   CHOL 130 08/22/2019   HDL 60 08/22/2019   LDLCALC 58 08/22/2019   TRIG 55 08/22/2019   CHOLHDL 2 06/07/2018   Lab Results  Component Value Date   ALT 25 08/22/2019   AST 21 08/22/2019   ALKPHOS 81 08/22/2019   BILITOT 0.5 08/22/2019   The 10-year ASCVD risk score Nichole Bussing DC Jr., Nichole al., 2013) is: 6.3%   Values used to calculate the score:     Age: 64 years     Sex: Female     Is Non-Hispanic  African American: No     Diabetic: No     Tobacco smoker: No     Systolic Blood Pressure: 952 mmHg     Is BP treated: Yes     HDL Cholesterol: 60 mg/dL     Total Cholesterol: 130 mg/dL  Other specified hypothyroidism. Nichole Smith is on Synthroid. No heat/cold intolerance.  Lab Results  Component Value Date   TSH 1.220 08/22/2019   Assessment/Plan:   Prediabetes. Anayla will continue her medications, work on weight loss, exercise, and decreasing simple carbohydrates to help decrease the risk of diabetes. Comprehensive metabolic panel, Hemoglobin A1c, Insulin, random labs ordered.  Vitamin D deficiency. Low Vitamin D level contributes to fatigue and are associated with obesity, breast, and colon cancer. She agrees to continue to take Vitamin D and will have routine testing of VITAMIN D 25 Hydroxy (Vit-D Deficiency, Fractures) level checked today.  Other hyperlipidemia. Cardiovascular risk and specific lipid/LDL goals reviewed.  We discussed several lifestyle modifications today and Riti will continue her medications, work on diet, exercise and weight loss efforts. Orders and follow up as documented in patient record. Lipid Panel With LDL/HDL Ratio levels ordered today.  Counseling Intensive lifestyle modifications are the first line treatment for this issue. . Dietary changes: Increase soluble fiber. Decrease simple carbohydrates. . Exercise changes: Moderate to vigorous-intensity aerobic activity 150 minutes per week if tolerated. [name]  Lipid-lowering medications: see documented in medical record.    Other specified hypothyroidism. Patient with long-standing hypothyroidism, on levothyroxine therapy. She appears euthyroid. Orders and follow up as documented in patient record. Nichole Smith will continue her medication as prescribed. T3, T4, free, TSH levels ordered today.  Counseling . Good thyroid control is important for overall health. Supratherapeutic thyroid levels are dangerous and will not improve  weight loss results. . The correct way to take levothyroxine is fasting, with water, separated by at least 30 minutes from breakfast, and separated by more than 4 hours from calcium, iron, multivitamins, acid reflux medications (PPIs).    Class 3 severe obesity with serious comorbidity and body mass index (BMI) of 40.0 to 44.9 in adult, unspecified obesity type ([place]).  Nichole Smith is currently in the action stage of change. As such, her goal is to continue with weight loss efforts. She has agreed to keeping a food journal and adhering to recommended goals of 1500-1600 calories and 95 grams of protein daily.  Exercise goals: Older adults should follow the adult guidelines. When older adults cannot meet the adult guidelines, they should be as physically active as their abilities and conditions will allow.   Behavioral modification strategies: meal planning and cooking strategies and keeping a strict food journal.  Tammatha has agreed to follow-up with our clinic in 3 weeks. She was informed of the importance of frequent follow-up visits to maximize her success with intensive lifestyle modifications for her multiple health conditions.   Nichole Smith was informed we would discuss her lab results at her next visit unless there is a critical issue that needs to be addressed sooner. Nichole Smith agreed to keep her next visit at the agreed upon time to discuss these results.  Objective:   Blood pressure 134/76, pulse (!) 53, temperature 98.4 F (36.9 C), temperature source Oral, height 4\' 11"  (1.499 m), weight 212 lb (96.2 kg), last menstrual period 02/12/2011, SpO2 95 %. Body mass index is 42.82 kg/m.  General: Cooperative, alert, well developed, in no acute distress. HEENT: Conjunctivae and lids unremarkable. Cardiovascular: Regular rhythm.  Lungs: Normal work of breathing. Neurologic: No focal deficits.   Lab Results  Component Value Date   CREATININE 0.68 08/22/2019   BUN 23 08/22/2019   NA 142 08/22/2019   K  4.3 08/22/2019   CL 102 08/22/2019   CO2 26 08/22/2019   Lab Results  Component Value Date   ALT 25 08/22/2019   AST 21 08/22/2019   ALKPHOS 81 08/22/2019   BILITOT 0.5 08/22/2019   Lab Results  Component Value Date   HGBA1C 5.6 08/22/2019   HGBA1C 5.9 (H) 01/12/2019   HGBA1C 5.9 06/07/2018   Lab Results  Component Value Date   INSULIN 21.5 08/22/2019   INSULIN 20.4 01/12/2019   Lab Results  Component Value Date   TSH 1.220 08/22/2019   Lab Results  Component Value Date   CHOL 130 08/22/2019   HDL 60 08/22/2019   LDLCALC 58 08/22/2019   TRIG 55 08/22/2019   CHOLHDL 2 06/07/2018   Lab Results  Component Value Date   WBC 7.8 01/12/2019   HGB 16.3 (H) 01/12/2019   HCT 47.8 (H) 01/12/2019   MCV 91 01/12/2019   PLT 228.0 06/07/2018   No results found for: IRON, TIBC, FERRITIN  Obesity Behavioral Intervention Documentation for Insurance:   Approximately 15 minutes were spent on the discussion below.  ASK: We discussed the diagnosis of obesity with Nichole Smith today and Nichole Smith agreed to give [name]  permission to discuss obesity behavioral modification therapy today.  ASSESS: Nichole Smith has the diagnosis of obesity and her BMI today is 42.8. Nichole Smith is in the action stage of change.   ADVISE: Nichole Smith was educated on the multiple health risks of obesity as well as the benefit of weight loss to improve her health. She was advised of the need for long term treatment and the importance of lifestyle modifications to improve her current health and to decrease her risk of future health problems.  AGREE: Multiple dietary modification options and treatment options were discussed and Nichole Smith agreed to follow the recommendations documented in the above note.  ARRANGE: Nichole Smith was educated on the importance of frequent visits to treat obesity as outlined per CMS and USPSTF guidelines and agreed to schedule her next follow up appointment today.  Attestation Statements:   Reviewed by clinician on  day of visit: allergies, medications, problem list, medical history, surgical history, family history, social history, and previous encounter notes.  [name], am acting as transcriptionist for Abby Potash, PA-C  I have reviewed the above documentation for accuracy and completeness, and I agree with the above. Abby Potash, PA-C

## 2020-01-03 LAB — COMPREHENSIVE METABOLIC PANEL
ALT: 25 IU/L (ref 0–32)
AST: 23 IU/L (ref 0–40)
Albumin/Globulin Ratio: 1.3 (ref 1.2–2.2)
Albumin: 4 g/dL (ref 3.8–4.8)
Alkaline Phosphatase: 79 IU/L (ref 39–117)
BUN/Creatinine Ratio: 33 — ABNORMAL HIGH (ref 12–28)
BUN: 21 mg/dL (ref 8–27)
Bilirubin Total: 0.4 mg/dL (ref 0.0–1.2)
CO2: 27 mmol/L (ref 20–29)
Calcium: 9.7 mg/dL (ref 8.7–10.3)
Chloride: 101 mmol/L (ref 96–106)
Creatinine, Ser: 0.64 mg/dL (ref 0.57–1.00)
GFR calc Af Amer: 108 mL/min/{1.73_m2} (ref >59)
GFR calc non Af Amer: 94 mL/min/{1.73_m2} (ref >59)
Globulin, Total: 3 g/dL (ref 1.5–4.5)
Glucose: 95 mg/dL (ref 65–99)
Potassium: 4.3 mmol/L (ref 3.5–5.2)
Sodium: 142 mmol/L (ref 134–144)
Total Protein: 7 g/dL (ref 6.0–8.5)

## 2020-01-03 LAB — HEMOGLOBIN A1C
Est. average glucose Bld gHb Est-mCnc: 111 mg/dL
Hgb A1c MFr Bld: 5.5 % (ref 4.8–5.6)

## 2020-01-03 LAB — LIPID PANEL WITH LDL/HDL RATIO
Cholesterol, Total: 117 mg/dL (ref 100–199)
HDL: 53 mg/dL (ref >39)
LDL Chol Calc (NIH): 51 mg/dL (ref 0–99)
LDL/HDL Ratio: 1 ratio (ref 0.0–3.2)
Triglycerides: 57 mg/dL (ref 0–149)
VLDL Cholesterol Cal: 13 mg/dL (ref 5–40)

## 2020-01-03 LAB — T3: T3, Total: 98 ng/dL (ref 71–180)

## 2020-01-03 LAB — VITAMIN D 25 HYDROXY (VIT D DEFICIENCY, FRACTURES): Vit D, 25-Hydroxy: 63.9 ng/mL (ref 30.0–100.0)

## 2020-01-03 LAB — T4, FREE: Free T4: 1.7 ng/dL (ref 0.82–1.77)

## 2020-01-03 LAB — INSULIN, RANDOM: INSULIN: 13.5 u[IU]/mL (ref 2.6–24.9)

## 2020-01-03 LAB — TSH: TSH: 0.746 u[IU]/mL (ref 0.450–4.500)

## 2020-01-11 ENCOUNTER — Ambulatory Visit: Payer: Medicare Other

## 2020-01-12 ENCOUNTER — Encounter: Payer: Self-pay | Admitting: Cardiology

## 2020-01-12 ENCOUNTER — Other Ambulatory Visit: Payer: Self-pay

## 2020-01-12 ENCOUNTER — Ambulatory Visit
Admission: RE | Admit: 2020-01-12 | Discharge: 2020-01-12 | Disposition: A | Discharge status: Home / Self Care | Payer: Self-pay | Source: Ambulatory Visit | Attending: Cardiology | Admitting: Cardiology

## 2020-01-12 ENCOUNTER — Ambulatory Visit (INDEPENDENT_AMBULATORY_CARE_PROVIDER_SITE_OTHER): Payer: Medicare Other | Admitting: Cardiology

## 2020-01-12 VITALS — BP 132/80 | HR 62 | Ht 59.0 in | Wt 217.0 lb

## 2020-01-12 DIAGNOSIS — I2510 Atherosclerotic heart disease of native coronary artery without angina pectoris: Secondary | ICD-10-CM

## 2020-01-12 DIAGNOSIS — E785 Hyperlipidemia, unspecified: Secondary | ICD-10-CM | POA: Diagnosis not present

## 2020-01-12 DIAGNOSIS — I10 Essential (primary) hypertension: Secondary | ICD-10-CM

## 2020-01-12 DIAGNOSIS — I2584 Coronary atherosclerosis due to calcified coronary lesion: Secondary | ICD-10-CM

## 2020-01-12 DIAGNOSIS — E119 Type 2 diabetes mellitus without complications: Secondary | ICD-10-CM

## 2020-01-12 DIAGNOSIS — R002 Palpitations: Secondary | ICD-10-CM

## 2020-01-12 NOTE — Patient Instructions (Signed)
Medication Instructions:  Your physician recommends that you continue on your current medications as directed. Please refer to the Current Medication list given to you today.  *If you need a refill on your cardiac medications before your next appointment, please call your pharmacy*  Testing/Procedures: Your physician has requested that you have Calcium CT Scan.  Follow-Up: At [facility], you and your health needs are our priority.  As part of our continuing mission to provide you with exceptional heart care, we have created designated Provider Care Teams.  These Care Teams include your primary Cardiologist (physician) and Advanced Practice Providers (APPs -  Physician Assistants and Nurse Practitioners) who all work together to provide you with the care you need, when you need it.  Your next appointment:   1 year(s)  The format for your next appointment:   In Person  Provider:   You may see Fransico Him, MD or one of the following Advanced Practice Providers on your designated Care Team:    Melina Copa, PA-C  Ermalinda Barrios, PA-C

## 2020-01-12 NOTE — Progress Notes (Signed)
Cardiology Office Note:    Date:  01/12/2020   ID:  Pernell, Dikes [birth year], MRN [id number]  PCP:  Burnis Medin, MD  Cardiologist:  Fransico Him, MD    Referring MD: Burnis Medin, MD   Chief Complaint  Patient presents with  . Follow-up    Coronary calcifications, HTN, HLD, DM, obesity    History of Present Illness:    Nichole Smith is a 64 y.o. 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 Cyndia Bent, HTN, OSA on CPAP followed by Dr. Daisey Must coronary artery calcifications on chest CT as well as calcifications of the aortic arch and carotid bifurcation. She had a nuclear stress test 08/2016 for risk stratification that was low risk with no ischemia.   She is here today for followup and is doing well but does have several issues she wants to discuss.  She tells me that she had an episode of chest pain since she saw me last that was sharp and stabbing and only lasted a short period of time.  It was nonexertional with no associated sx of N/diaphoresis or SOB.  There was no radiation of the pain. She also has been having a fluttering in her chest that she is concerned about.    She had 2 episodes in 2019, 3 episodes in 2020 and her last one was 11/28/2019 when her heart started racing out of the blue.  She check her HR on her BP monitor and her HR was 140bpm with a Bp of 145/32mmHg.  It lasted about 20 minutes and then HR went back to 70bpm.  She has not had any further episodes since then.  She wore an event monitor in the past but never had any palpitations when wearing it.    She denies any SOB, DOE, PND, orthopnea, LE edema, dizziness or syncope. She is compliant with her meds and is tolerating meds with no SE.    Past Medical History:  Diagnosis Date  . Arthritis    knees, back  . Cancer ([place])   . Carcinoid tumor of lung 2012   right found incidentally on chest xray eval for r/o vasxculitis   . Carpal tunnel syndrome,  bilateral   . Coronary artery calcification 07/23/2016  . Double vision    history, no current problem  . Dysrhythmia    irregular occassional, no current problem  . Frequent UTI   . HNP (herniated nucleus pulposus), cervical 01/22/2011   C6-C7  Has seen dr Vertell Limber for this and Dr Tonita Cong  Positional numbness in hands no weakness    . Hypertension   . Hypothyroidism   . Infertility, female   . 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 Obstructive sleep apnea 07/27/2014   NPSG 07/2014:  AHI 85/hr, optimal cpap 10cm.  Download 08/2014:  Good compliance, breakthru apnea on 10cm.  Changed to auto 5-12cm >> good control of AHI on f/u download ONO on CPAP:      . Osteoarthritis   . PCOS (polycystic ovarian syndrome)   . SUI (stress urinary incontinence, female)    h/o    Past Surgical History:  Procedure Laterality Date  . ANTERIOR CERVICAL DECOMP/DISCECTOMY FUSION  12/24/2017   Procedure: Cervical five-six Cervical six-seven Anterior cervical decompression/discectomy/fusion;  Surgeon: Erline Levine, MD;  Location: [facility];  Service: Neurosurgery;;  . Wilmon Pali RELEASE Left 12/24/2017   Procedure: LEFT CARPAL TUNNEL RELEASE;  Surgeon: Erline Levine, MD;  Location: [facility] [place] [facility];  Service: Neurosurgery;  Laterality: Left;  . CESAREAN SECTION     x 1 - twins  . COLONOSCOPY    . DILATATION & CURRETTAGE/HYSTEROSCOPY WITH RESECTOCOPE N/A 07/18/2013   Procedure: DILATATION & CURETTAGE/HYSTEROSCOPY WITH RESECTOCOPE;  Surgeon: Lyman Speller, MD;  Location: [facility];  Service: Gynecology;  Laterality: N/A;  mass resection  . Lung tumor removed     Rt lung carcinoid  . MOUTH SURGERY     pre-cancerous ulcer removed  . SHOULDER OPEN ROTATOR CUFF REPAIR  07/20/2010   Right  . WISDOM TOOTH EXTRACTION      Current Medications: Current Meds  Medication Sig  . aspirin EC 81 MG tablet Take 81 mg by mouth at bedtime.  [name] atorvastatin (LIPITOR) 40 MG tablet Take 1 tablet (40 mg total) by mouth daily.  Please keep upcoming appt for future refills. Thank you  . CALCIUM PO Take 1 tablet by mouth at bedtime.   [name] diltiazem (CARDIZEM CD) 180 MG 24 hr capsule Take 1 capsule (180 mg total) by mouth at bedtime. Please keep upcoming appt in December with Dr. Radford Pax for future refills. Thank you  . estradiol (ESTRACE VAGINAL) 0.1 MG/GM vaginal cream INSERT 1 GM VAGINALLY TWICE WEEKLY  . hydrochlorothiazide (MICROZIDE) 12.5 MG capsule TAKE 1 CAPSULE DAILY.   SCHEDULE ANNUAL VISIT  . metFORMIN (GLUCOPHAGE) 500 MG tablet Take 1 tablet (500 mg total) by mouth 3 (three) times daily with meals.  . metoprolol succinate (TOPROL-XL) 50 MG 24 hr tablet TAKE 1 TABLET DAILY (NEED APPOINTMENT FOR FUTURE REFILLS)  . Multiple Vitamins-Minerals (PRESERVISION AREDS 2 PO) Take 1 tablet by mouth 2 (two) times daily.  . naproxen sodium (ALEVE) 220 MG tablet Take 220 mg by mouth 2 (two) times daily as needed (for pain.).  [name] oxybutynin (DITROPAN-XL) 10 MG 24 hr tablet Take 10 mg by mouth daily.  [name] SYNTHROID 137 MCG tablet TAKE 1 TABLET DAILY  . Vitamin D, Ergocalciferol, (DRISDOL) 1.25 MG (50000 UT) CAPS capsule Take 1 capsule (50,000 Units total) by mouth every 7 (seven) days.     Allergies:   Neomycin and Ace inhibitors   Social History   Socioeconomic History  . Marital status: Married    Spouse name: [name]  . Number of children: 2  . Years of education: Not on file  . Highest education level: Not on file  Occupational History  . 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 Smoking status: Never Smoker  . Smokeless tobacco: Never Used  Substance and Sexual Activity  . Alcohol use: Not Currently  . Drug use: No  . Sexual activity: Not Currently    Partners: Male    Birth control/protection: Post-menopausal  Other Topics Concern  . Not on file  Social History Narrative   hhof 2    2 Children at college and  beyond      [name]    Married    Retired age 1 2015    Recently moved taking care of grandchildren during the week.   Social Determinants of Health   Financial Resource Strain:   . Difficulty of Paying Living Expenses: Not on file  Food Insecurity:   . Worried About Charity fundraiser in the Last Year: Not on file  . Ran Out of Food in the Last Year: Not on file  Transportation Needs:   . Lack of Transportation (Medical): Not on file  . Lack  of Transportation (Non-Medical): Not on file  Physical Activity:   . Days of Exercise per Week: Not on file  . Minutes of Exercise per Session: Not on file  Stress:   . Feeling of Stress : Not on file  Social Connections:   . Frequency of Communication with Friends and Family: Not on file  . Frequency of Social Gatherings with Friends and Family: Not on file  . Attends Religious Services: Not on file  . Active Member of Clubs or Organizations: Not on file  . Attends Archivist Meetings: Not on file  . Marital Status: Not on file     Family History: The patient's family history includes Alcohol abuse in her father; Alcoholism in her mother; Coronary artery disease in her father; Diabetes in her brother; Hypertension in her brother, father, maternal uncle, and mother; Liver disease in her father; Obesity in her father; Parkinson's disease in her brother; Stroke in her mother; Sudden death in her mother. There is no history of Colon cancer, Rectal cancer, or Stomach cancer.  ROS:   Please see the history of present illness.    ROS  All other systems reviewed and negative.   EKGs/Labs/Other Studies Reviewed:    The following studies were reviewed today: EKG, past office notes from PCP and outside labs from [facility]  EKG:  EKG is  ordered today.  The ekg ordered today demonstrates NSR at 62bpm with low voltage in the precordial leads and no ST changes  Recent Labs: 01/02/2020: ALT 25; BUN 21; Creatinine, Ser 0.64; Potassium 4.3; Sodium 142; TSH 0.746   Recent Lipid Panel    Component  Value Date/Time   CHOL 117 01/02/2020 1155   CHOL 110 11/03/2016 0000   TRIG 57 01/02/2020 1155   TRIG 54 11/03/2016 0000   HDL 53 01/02/2020 1155   HDL 48 (L) 11/03/2016 0000   CHOLHDL 2 06/07/2018 0739   VLDL 10.8 06/07/2018 0739   LDLCALC 51 01/02/2020 1155   LDLCALC 49 11/03/2016 0000    Physical Exam:    VS:  BP 132/80   Pulse 62   Ht 4\' 11"  (1.499 m)   Wt 217 lb (98.4 kg)   LMP 02/12/2011   BMI 43.83 kg/m     Wt Readings from Last 3 Encounters:  01/12/20 217 lb (98.4 kg)  01/02/20 212 lb (96.2 kg)  12/08/19 218 lb (98.9 kg)     GEN:  Well nourished, well developed in no acute distress HEENT: Normal NECK: No JVD; No carotid bruits LYMPHATICS: No lymphadenopathy CARDIAC: RRR, no murmurs, rubs, gallops RESPIRATORY:  Clear to auscultation without rales, wheezing or rhonchi  ABDOMEN: Soft, non-tender, non-distended MUSCULOSKELETAL:  No edema; No deformity  SKIN: Warm and dry NEUROLOGIC:  Alert and oriented x 3 PSYCHIATRIC:  Normal affect   ASSESSMENT:    1. Coronary artery calcification   2. Essential hypertension   3. Hyperlipidemia LDL goal <70   4. Morbid obesity ([place])   5. DM type 2, goal HbA1c < 7% (HCC)    PLAN:    In order of problems listed above:  1.  Coronary artery calcifications/Aortic atherosclerosis -she has had 1 episode of atypical CP that was sharp and stabbing and short lived with no associated sx and was nonexertional. -she tells me she can exercise with no problems with CP or SOB -given that she did have some coronary calcification on Chest CT, I will get a coronary calcium score to help risk assess -her CP  sx are very atypical and likely not cardiac - I told her to call me if she had any further episodes.  She had a nuclear stress test in 2019 that showed no ischemia -continue statin -BP controlled  2.  HTN -BP controlled  -continue Cardizem CD 180mg  daily, HCTZ 12.5mg  daily, Toprol XL 50mg  daily -labs were personally reviewed from  [facility] showing a Creatinine 0.64 and K+ 4.3 from 01/2020  3.  HLD -LDL goal < 70 -LDL reviewed from [facility] and was 51 in Feb 2021 -ALT was normal at 25 -continue atorvastatin 40mg  daily  4.  Morbid Obesity -I have encouraged her to get into a routine exercise program and cut back on carbs and portions.   5.  Type 2 DM -followed by PCP -continue Metformin 500mg  TID -HbA1C was 5.5% earlier this month after review on [facility]  6.  Palpitations -they only occur about 2-3 times a year and have not been able to catch them on a heart monitor -I have recommended that she buy the [employer] Kardia monitor to try to catch them and send me the rhythm strips   Medication Adjustments/Labs and Tests Ordered: Current medicines are reviewed at length with the patient today.  Concerns regarding medicines are outlined above.  Orders Placed This Encounter  Procedures  . EKG 12-Lead   No orders of the defined types were placed in this encounter.   Signed, Fransico Him, MD  01/12/2020 2:35 PM    [place]

## 2020-01-23 ENCOUNTER — Telehealth: Payer: Self-pay

## 2020-01-23 ENCOUNTER — Encounter (INDEPENDENT_AMBULATORY_CARE_PROVIDER_SITE_OTHER): Payer: Self-pay | Admitting: Physician Assistant

## 2020-01-23 ENCOUNTER — Ambulatory Visit (INDEPENDENT_AMBULATORY_CARE_PROVIDER_SITE_OTHER): Payer: Medicare Other | Admitting: Physician Assistant

## 2020-01-23 ENCOUNTER — Other Ambulatory Visit: Payer: Self-pay

## 2020-01-23 VITALS — BP 134/80 | HR 57 | Temp 98.4°F | Ht 59.0 in | Wt 211.0 lb

## 2020-01-23 DIAGNOSIS — Z6841 Body Mass Index (BMI) 40.0 and over, adult: Secondary | ICD-10-CM

## 2020-01-23 DIAGNOSIS — I2510 Atherosclerotic heart disease of native coronary artery without angina pectoris: Secondary | ICD-10-CM

## 2020-01-23 DIAGNOSIS — E559 Vitamin D deficiency, unspecified: Secondary | ICD-10-CM | POA: Diagnosis not present

## 2020-01-23 DIAGNOSIS — I2584 Coronary atherosclerosis due to calcified coronary lesion: Secondary | ICD-10-CM

## 2020-01-23 MED ORDER — VITAMIN D (ERGOCALCIFEROL) 1.25 MG (50000 UNIT) PO CAPS: 50000.0000 [IU] | ORAL_CAPSULE | ORAL | 0 refills | Status: DC

## 2020-01-23 NOTE — Progress Notes (Signed)
Chief Complaint:   OBESITY Nichole Smith is here to discuss her progress with her obesity treatment plan along with follow-up of her obesity related diagnoses. Nichole Smith is on keeping a food journal and adhering to recommended goals of 1500-1600 calories and 95 grams of protein and states she is following her eating plan approximately 50% of the time. Nichole Smith states she is walking, swimming for 30 minutes 7 times per week.  Today's visit was #: 20 Starting weight: 238 lbs Starting date: 01/12/2019 Today's weight: 211 lbs Today's date: 01/23/2020 Total lbs lost to date: 27 lbs Total lbs lost since last in-office visit: 1 lb  Nichole Smith reports a stressful last few weeks and states she overate her calories on some days.  She is bored with some of the foods and is asking about new ideas.  Subjective:   1. Vitamin D deficiency Nichole Smith Vitamin D level was 63.9 on 01/02/2020. She is currently taking vit D. She denies nausea, vomiting or muscle weakness.  Assessment/Plan:   1. Vitamin D deficiency Low Vitamin D level contributes to fatigue and are associated with obesity, breast, and colon cancer. She agrees to continue to take prescription Vitamin D @50 ,000 IU every week and will follow-up for routine testing of Vitamin D, at least 2-3 times per year to avoid over-replacement. - Vitamin D, Ergocalciferol, (DRISDOL) 1.25 MG (50000 UNIT) CAPS capsule; Take 1 capsule (50,000 Units total) by mouth every 7 (seven) days.  Dispense: 4 capsule; Refill: 0  2. Class 3 severe obesity with serious comorbidity and body mass index (BMI) of 40.0 to 44.9 in adult, unspecified obesity type Nichole Smith is currently in the action stage of change. As such, her goal is to continue with weight loss efforts. She has agreed to keeping a food journal and adhering to recommended goals of 1600 calories and 100 grams of protein.   Exercise goals: As is  Behavioral modification strategies: meal planning and cooking  strategies and keeping healthy foods in the home.  Nichole Smith has agreed to follow-up with our clinic in 3 weeks. She was informed of the importance of frequent follow-up visits to maximize her success with intensive lifestyle modifications for her multiple health conditions.   Objective:   Blood pressure 134/80, pulse (!) 57, temperature 98.4 F (36.9 C), temperature source Oral, height 4\' 11"  (1.499 m), weight 211 lb (95.7 kg), last menstrual period 02/12/2011, SpO2 96 %. Body mass index is 42.62 kg/m.  General: Cooperative, alert, well developed, in no acute distress. HEENT: Conjunctivae and lids unremarkable. Cardiovascular: Regular rhythm.  Lungs: Normal work of breathing. Neurologic: No focal deficits.   Lab Results  Component Value Date   CREATININE 0.64 01/02/2020   BUN 21 01/02/2020   NA 142 01/02/2020   K 4.3 01/02/2020   CL 101 01/02/2020   CO2 27 01/02/2020   Lab Results  Component Value Date   ALT 25 01/02/2020   AST 23 01/02/2020   ALKPHOS 79 01/02/2020   BILITOT 0.4 01/02/2020   Lab Results  Component Value Date   HGBA1C 5.5 01/02/2020   HGBA1C 5.6 08/22/2019   HGBA1C 5.9 (H) 01/12/2019   HGBA1C 5.9 06/07/2018   Lab Results  Component Value Date   INSULIN 13.5 01/02/2020   INSULIN 21.5 08/22/2019   INSULIN 20.4 01/12/2019   Lab Results  Component Value Date   TSH 0.746 01/02/2020   Lab Results  Component Value Date   CHOL 117 01/02/2020   HDL 53 01/02/2020  LDLCALC 51 01/02/2020   TRIG 57 01/02/2020   CHOLHDL 2 06/07/2018   Lab Results  Component Value Date   WBC 7.8 01/12/2019   HGB 16.3 (H) 01/12/2019   HCT 47.8 (H) 01/12/2019   MCV 91 01/12/2019   PLT 228.0 06/07/2018   Obesity Behavioral Intervention Documentation for Insurance:   Approximately 15 minutes were spent on the discussion below.  ASK: We discussed the diagnosis of obesity with Nichole Smith today and Nichole Smith agreed to give [name] permission to discuss obesity behavioral modification  therapy today.  ASSESS: Nichole Smith has the diagnosis of obesity and her BMI today is 42.8. Nichole Smith is in the action stage of change.   ADVISE: Nichole Smith was educated on the multiple health risks of obesity as well as the benefit of weight loss to improve her health. She was advised of the need for long term treatment and the importance of lifestyle modifications to improve her current health and to decrease her risk of future health problems.  AGREE: Multiple dietary modification options and treatment options were discussed and Nichole Smith agreed to follow the recommendations documented in the above note.  ARRANGE: Nichole Smith was educated on the importance of frequent visits to treat obesity as outlined per CMS and USPSTF guidelines and agreed to schedule her next follow up appointment today.  Attestation Statements:   Reviewed by clinician on day of visit: allergies, medications, problem list, medical history, surgical history, family history, social history, and previous encounter notes.  I, Water quality scientist, CMA, am acting as Location manager for [employer], PA-C.  I have reviewed the above documentation for accuracy and completeness, and I agree with the above. Abby Potash, PA-C

## 2020-01-23 NOTE — Telephone Encounter (Signed)
The patient has been notified of the result and verbalized understanding.  All questions (if any) were answered. Antonieta Iba, RN 01/23/2020 2:42 PM

## 2020-01-23 NOTE — Telephone Encounter (Signed)
-----   Message from Sueanne Margarita, MD sent at 01/23/2020  1:29 PM EST ----- Coronary Ca++ score very high with portends a high risk for future cardiac events.  See telephone note to get copy of last FLP, CMET and HbA1C.  Get Lexiscan myoview to rule out ischemia

## 2020-02-01 ENCOUNTER — Telehealth (HOSPITAL_COMMUNITY): Payer: Self-pay | Admitting: *Deleted

## 2020-02-01 ENCOUNTER — Encounter (HOSPITAL_COMMUNITY): Payer: Self-pay | Admitting: *Deleted

## 2020-02-01 NOTE — Telephone Encounter (Signed)
Patient given detailed instructions per Myocardial Perfusion Study Information Sheet for the test on 02/08/2020 at 1300. Patient notified to arrive 15 minutes early and that it is imperative to arrive on time for appointment to keep from having the test rescheduled.  If you need to cancel or reschedule your appointment, please call the office within 24 hours of your appointment. . Patient verbalized understanding.[place]  [place] letter sent with instructions

## 2020-02-08 ENCOUNTER — Other Ambulatory Visit: Payer: Self-pay

## 2020-02-08 ENCOUNTER — Encounter (HOSPITAL_COMMUNITY): Payer: Medicare Other

## 2020-02-08 ENCOUNTER — Ambulatory Visit (HOSPITAL_COMMUNITY): Payer: Medicare Other | Attending: Cardiology

## 2020-02-08 DIAGNOSIS — I2584 Coronary atherosclerosis due to calcified coronary lesion: Secondary | ICD-10-CM | POA: Insufficient documentation

## 2020-02-08 DIAGNOSIS — I2510 Atherosclerotic heart disease of native coronary artery without angina pectoris: Secondary | ICD-10-CM | POA: Insufficient documentation

## 2020-02-08 MED ORDER — REGADENOSON 0.4 MG/5ML IV SOLN
0.4000 mg | Freq: Once | INTRAVENOUS | Status: AC
  Administered 2020-02-08: 0.4 mg via INTRAVENOUS

## 2020-02-08 MED ORDER — TECHNETIUM TC 99M TETROFOSMIN IV KIT
32.7000 | PACK | Freq: Once | INTRAVENOUS | Status: AC | PRN
  Administered 2020-02-08: 32.7 via INTRAVENOUS
  Filled 2020-02-08: qty 33

## 2020-02-09 ENCOUNTER — Ambulatory Visit (HOSPITAL_COMMUNITY): Payer: Medicare Other

## 2020-02-09 ENCOUNTER — Ambulatory Visit (HOSPITAL_COMMUNITY): Payer: Medicare Other | Attending: Cardiovascular Disease

## 2020-02-09 LAB — MYOCARDIAL PERFUSION IMAGING
LV dias vol: 79 mL (ref 46–106)
LV sys vol: 30 mL
Peak HR: 93 {beats}/min
Rest HR: 68 {beats}/min
SDS: 0
SRS: 0
SSS: 0
TID: 1.03

## 2020-02-09 MED ORDER — TECHNETIUM TC 99M TETROFOSMIN IV KIT
31.3000 | PACK | Freq: Once | INTRAVENOUS | Status: AC | PRN
  Administered 2020-02-09: 31.3 via INTRAVENOUS
  Filled 2020-02-09: qty 32

## 2020-02-12 ENCOUNTER — Other Ambulatory Visit: Payer: Self-pay | Admitting: Cardiology

## 2020-02-13 ENCOUNTER — Encounter (INDEPENDENT_AMBULATORY_CARE_PROVIDER_SITE_OTHER): Payer: Self-pay | Admitting: Family Medicine

## 2020-02-13 ENCOUNTER — Other Ambulatory Visit: Payer: Self-pay

## 2020-02-13 ENCOUNTER — Ambulatory Visit (INDEPENDENT_AMBULATORY_CARE_PROVIDER_SITE_OTHER): Payer: Medicare Other | Admitting: Family Medicine

## 2020-02-13 VITALS — BP 118/72 | HR 57 | Temp 97.8°F | Ht 59.0 in | Wt 209.0 lb

## 2020-02-13 DIAGNOSIS — K5909 Other constipation: Secondary | ICD-10-CM

## 2020-02-13 DIAGNOSIS — Z6841 Body Mass Index (BMI) 40.0 and over, adult: Secondary | ICD-10-CM | POA: Diagnosis not present

## 2020-02-13 NOTE — Progress Notes (Signed)
Chief Complaint:   OBESITY Nichole Smith is here to discuss her progress with her obesity treatment plan along with follow-up of her obesity related diagnoses. Nichole Smith is on keeping a food journal and adhering to recommended goals of 1600 calories and 100 grams of protein and states she is following her eating plan approximately 95% of the time. Nichole Smith states she is walking, swimming, doing yoga, and strength training for 40 minutes 7 times per week.  Today's visit was #: 21 Starting weight: 238 lbs Starting date: 01/12/2019 Today's weight: 209 lbs Today's date: 02/13/2020 Total lbs lost to date: 29 Total lbs lost since last in-office visit: 2  Interim History: Nichole Smith is meeting her protein goals and calorie goals consistently. She is doing a great job adhering to there plan. She is frustrated with slow weight loss. She has lost almost 30 lbs over the past year but feels that it should be more for the effort she has put into it.   Subjective:   1. Other constipation Nichole Smith denies blood in her stool. She notes harder stools despite no changes in oral intake. She is eating plenty of fiber and drinking adequate water.  Assessment/Plan:   1. Other constipation Nichole Smith was informed that a decrease in bowel movement frequency is normal while losing weight, but stools should not be hard or painful. Nichole Smith is to add stool softener daily. Orders and follow up as documented in patient record.   2. Class 3 severe obesity with serious comorbidity and body mass index (BMI) of 40.0 to 44.9 in adult, unspecified obesity type [facility]) Nichole Smith is currently in the action stage of change. As such, her goal is to continue with weight loss efforts. She has agreed to keeping a food journal and adhering to recommended goals of 1500-1600 calories and 100 grams of protein daily.   Encouragement was provided today.  Exercise goals: As is.  Behavioral modification strategies: better snacking choices and planning for  success.  Nichole Smith has agreed to follow-up with our clinic in 3 weeks. She was informed of the importance of frequent follow-up visits to maximize her success with intensive lifestyle modifications for her multiple health conditions.   Objective:   Blood pressure 118/72, pulse (!) 57, temperature 97.8 F (36.6 C), temperature source Oral, height 4\' 11"  (1.499 m), weight 209 lb (94.8 kg), last menstrual period 02/12/2011, SpO2 96 %. Body mass index is 42.21 kg/m.  General: Cooperative, alert, well developed, in no acute distress. HEENT: Conjunctivae and lids unremarkable. Cardiovascular: Regular rhythm.  Lungs: Normal work of breathing. Neurologic: No focal deficits.   Lab Results  Component Value Date   CREATININE 0.64 01/02/2020   BUN 21 01/02/2020   NA 142 01/02/2020   K 4.3 01/02/2020   CL 101 01/02/2020   CO2 27 01/02/2020   Lab Results  Component Value Date   ALT 25 01/02/2020   AST 23 01/02/2020   ALKPHOS 79 01/02/2020   BILITOT 0.4 01/02/2020   Lab Results  Component Value Date   HGBA1C 5.5 01/02/2020   HGBA1C 5.6 08/22/2019   HGBA1C 5.9 (H) 01/12/2019   HGBA1C 5.9 06/07/2018   Lab Results  Component Value Date   INSULIN 13.5 01/02/2020   INSULIN 21.5 08/22/2019   INSULIN 20.4 01/12/2019   Lab Results  Component Value Date   TSH 0.746 01/02/2020   Lab Results  Component Value Date   CHOL 117 01/02/2020   HDL 53 01/02/2020   LDLCALC 51 01/02/2020   TRIG  57 01/02/2020   CHOLHDL 2 06/07/2018   Lab Results  Component Value Date   WBC 7.8 01/12/2019   HGB 16.3 (H) 01/12/2019   HCT 47.8 (H) 01/12/2019   MCV 91 01/12/2019   PLT 228.0 06/07/2018   No results found for: IRON, TIBC, FERRITIN  Obesity Behavioral Intervention Documentation for Insurance:   Approximately 15 minutes were spent on the discussion below.  ASK: We discussed the diagnosis of obesity with Nichole Smith today and Nichole Smith agreed to give [name] permission to discuss obesity behavioral  modification therapy today.  ASSESS: Nichole Smith has the diagnosis of obesity and her BMI today is 42.19. Nichole Smith is in the action stage of change.   ADVISE: Nichole Smith was educated on the multiple health risks of obesity as well as the benefit of weight loss to improve her health. She was advised of the need for long term treatment and the importance of lifestyle modifications to improve her current health and to decrease her risk of future health problems.  AGREE: Multiple dietary modification options and treatment options were discussed and Nichole Smith agreed to follow the recommendations documented in the above note.  ARRANGE: Nichole Smith was educated on the importance of frequent visits to treat obesity as outlined per CMS and USPSTF guidelines and agreed to schedule her next follow up appointment today.  Attestation Statements:   Reviewed by clinician on day of visit: allergies, medications, problem list, medical history, surgical history, family history, social history, and previous encounter notes.   [name], am acting as Location manager for [employer], FNP-C.  I have reviewed the above documentation for accuracy and completeness, and I agree with the above. - [name], FNP

## 2020-02-14 ENCOUNTER — Telehealth: Payer: Self-pay

## 2020-02-14 NOTE — Telephone Encounter (Signed)
The patient has been notified of the result and verbalized understanding.  All questions (if any) were answered. Patient is not having any further chest pain.  Patient is still very concerned about her high calcium score seen on CT scan and what the next steps for her are. Requesting to speak with Dr. Radford Pax directly regarding this. I set her up for a virtual appt on 03/31

## 2020-02-14 NOTE — Telephone Encounter (Signed)
-----   Message from Sueanne Margarita, MD sent at 02/09/2020  5:16 PM EST ----- Please find out if she is having any further chest pain

## 2020-02-14 NOTE — Telephone Encounter (Signed)
  Patient Consent for Virtual Visit         Nichole Smith has provided verbal consent on 02/14/2020 for a virtual visit (video or telephone).   CONSENT FOR VIRTUAL VISIT FOR:  Nichole Smith  By participating in this virtual visit I agree to the following:  I hereby voluntarily request, consent and authorize [facility] and its employed or contracted physicians, physician assistants, nurse practitioners or other licensed health care professionals (the Practitioner), to provide me with telemedicine health care services (the "Services") as deemed necessary by the treating Practitioner. I acknowledge and consent to receive the Services by the Practitioner via telemedicine. I understand that the telemedicine visit will involve communicating with the Practitioner through live audiovisual communication technology and the disclosure of certain medical information by electronic transmission. I acknowledge that I have been given the opportunity to request an in-person assessment or other available alternative prior to the telemedicine visit and am voluntarily participating in the telemedicine visit.  I understand that I have the right to withhold or withdraw my consent to the use of telemedicine in the course of my care at any time, without affecting my right to future care or treatment, and that the Practitioner or I may terminate the telemedicine visit at any time. I understand that I have the right to inspect all information obtained and/or recorded in the course of the telemedicine visit and may receive copies of available information for a reasonable fee.  I understand that some of the potential risks of receiving the Services via telemedicine include:  [name] Delay or interruption in medical evaluation due to technological equipment failure or disruption; . Information transmitted may not be sufficient (e.g. poor resolution of images) to allow for appropriate medical decision making by the  Practitioner; and/or  . In rare instances, security protocols could fail, causing a breach of personal health information.  Furthermore, I acknowledge that it is my responsibility to provide information about my medical history, conditions and care that is complete and accurate to the best of my ability. I acknowledge that Practitioner's advice, recommendations, and/or decision may be based on factors not within their control, such as incomplete or inaccurate data provided by me or distortions of diagnostic images or specimens that may result from electronic transmissions. I understand that the practice of medicine is not an exact science and that Practitioner makes no warranties or guarantees regarding treatment outcomes. I acknowledge that a copy of this consent can be made available to me via my patient portal ([facility]), or I can request a printed copy by calling the office of [facility].    I understand that my insurance will be billed for this visit.   I have read or had this consent read to me. . I understand the contents of this consent, which adequately explains the benefits and risks of the Services being provided via telemedicine.  . I have been provided ample opportunity to ask questions regarding this consent and the Services and have had my questions answered to my satisfaction. . I give my informed consent for the services to be provided through the use of telemedicine in my medical care

## 2020-02-29 ENCOUNTER — Encounter: Payer: Self-pay | Admitting: Cardiology

## 2020-02-29 ENCOUNTER — Telehealth (INDEPENDENT_AMBULATORY_CARE_PROVIDER_SITE_OTHER): Payer: Medicare Other | Admitting: Cardiology

## 2020-02-29 ENCOUNTER — Encounter (INDEPENDENT_AMBULATORY_CARE_PROVIDER_SITE_OTHER): Payer: Self-pay | Admitting: Family Medicine

## 2020-02-29 VITALS — BP 132/73 | HR 61 | Ht 59.0 in | Wt 209.0 lb

## 2020-02-29 DIAGNOSIS — I2584 Coronary atherosclerosis due to calcified coronary lesion: Secondary | ICD-10-CM

## 2020-02-29 DIAGNOSIS — E785 Hyperlipidemia, unspecified: Secondary | ICD-10-CM | POA: Diagnosis not present

## 2020-02-29 DIAGNOSIS — R002 Palpitations: Secondary | ICD-10-CM

## 2020-02-29 DIAGNOSIS — I10 Essential (primary) hypertension: Secondary | ICD-10-CM | POA: Diagnosis not present

## 2020-02-29 DIAGNOSIS — I2510 Atherosclerotic heart disease of native coronary artery without angina pectoris: Secondary | ICD-10-CM

## 2020-02-29 NOTE — Progress Notes (Signed)
Virtual Visit via Telephone Note   This visit type was conducted due to national recommendations for restrictions regarding the COVID-19 Pandemic (e.g. social distancing) in an effort to limit this patient's exposure and mitigate transmission in our community.  Due to her co-morbid illnesses, this patient is at least at moderate risk for complications without adequate follow up.  This format is felt to be most appropriate for this patient at this time.  The patient did not have access to video technology/had technical difficulties with video requiring transitioning to audio format only (telephone).  All issues noted in this document were discussed and addressed.  No physical exam could be performed with this format.  Please refer to the patient's chart for her  consent to telehealth for [facility]  Evaluation Performed:  Follow-up visit  This visit type was conducted due to national recommendations for restrictions regarding the COVID-19 Pandemic (e.g. social distancing).  This format is felt to be most appropriate for this patient at this time.  All issues noted in this document were discussed and addressed.  No physical exam was performed (except for noted visual exam findings with Video Visits).  Please refer to the patient's chart (MyChart message for video visits and phone note for telephone visits) for the patient's consent to telehealth for [facility].  Date:  02/29/2020   ID:  Nichole, Smith [birth year], MRN [id number]  Patient Location:  Home  Provider location:   [place]  PCP:  Burnis Medin, MD  Cardiologist:  Fransico Him, MD  Electrophysiologist:  None   Chief Complaint:  Coronary calcifications, HTN, HLD, DM  History of Present Illness:    Nichole Smith is a 64 y.o. female who presents via audio/video conferencing for a telehealth visit today.    Nichole Smith is a 64 y.o. 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 Cyndia Bent, HTN, OSA on CPAP followed by Dr. Daisey Must coronary artery calcifications on chest CT as well as calcifications of the aortic arch and carotid bifurcation. She had a nuclear stress test 08/2018 that was low risk with no ischemia.  I saw her recently and she had complained of some atypical CP along with palpitations.  Her CP was felt to be noncardiac.  She also complained of palpitations that only occurred 2-3 times a year and I recommended that she purchase an Chief of Staff monitor.  She underwent coronary Ca score and was very elevated at 1272.  She is here today for followup and is doing well.  She denies any chest pain or pressure, SOB, DOE, PND, orthopnea, LE edema, dizziness, palpitations or syncope. She is compliant with her meds and is tolerating meds with no SE.    The patient does not have symptoms concerning for COVID-19 infection (fever, chills, cough, or new shortness of breath).   Prior CV studies:   The following studies were reviewed today:  none  Past Medical History:  Diagnosis Date  . Arthritis    knees, back  . Cancer ([place])   . Carcinoid tumor of lung 2012   right found incidentally on chest xray eval for r/o vasxculitis   . Carpal tunnel syndrome, bilateral   . Coronary artery calcification 07/23/2016  . Double vision    history, no current problem  . Dysrhythmia    irregular occassional, no current problem  . Frequent UTI   . HNP (herniated nucleus pulposus), cervical 01/22/2011  C6-C7  Has seen dr Vertell Limber for this and Dr Tonita Cong  Positional numbness in hands no weakness    . Hypertension   . Hypothyroidism   . Infertility, female   . 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 Obstructive sleep apnea 07/27/2014   NPSG 07/2014:  AHI 85/hr, optimal cpap 10cm.  Download 08/2014:  Good compliance, breakthru apnea on 10cm.  Changed to auto 5-12cm >> good control of AHI on f/u download ONO on CPAP:      . Osteoarthritis   . PCOS (polycystic  ovarian syndrome)   . SUI (stress urinary incontinence, female)    h/o   Past Surgical History:  Procedure Laterality Date  . ANTERIOR CERVICAL DECOMP/DISCECTOMY FUSION  12/24/2017   Procedure: Cervical five-six Cervical six-seven Anterior cervical decompression/discectomy/fusion;  Surgeon: Erline Levine, MD;  Location: [facility];  Service: Neurosurgery;;  . Wilmon Pali RELEASE Left 12/24/2017   Procedure: LEFT CARPAL TUNNEL RELEASE;  Surgeon: Erline Levine, MD;  Location: [facility];  Service: Neurosurgery;  Laterality: Left;  . CESAREAN SECTION     x 1 - twins  . COLONOSCOPY    . DILATATION & CURRETTAGE/HYSTEROSCOPY WITH RESECTOCOPE N/A 07/18/2013   Procedure: DILATATION & CURETTAGE/HYSTEROSCOPY WITH RESECTOCOPE;  Surgeon: Lyman Speller, MD;  Location: [facility];  Service: Gynecology;  Laterality: N/A;  mass resection  . Lung tumor removed     Rt lung carcinoid  . MOUTH SURGERY     pre-cancerous ulcer removed  . SHOULDER OPEN ROTATOR CUFF REPAIR  07/20/2010   Right  . WISDOM TOOTH EXTRACTION       Current Meds  Medication Sig  . aspirin EC 81 MG tablet Take 81 mg by mouth at bedtime.  [name] atorvastatin (LIPITOR) 40 MG tablet Take 1 tablet (40 mg total) by mouth daily. Please keep upcoming appt for future refills. Thank you  . CALCIUM PO Take 1 tablet by mouth at bedtime.   [name] diltiazem (CARDIZEM CD) 180 MG 24 hr capsule Take 1 capsule (180 mg total) by mouth daily.  [name] estradiol (ESTRACE VAGINAL) 0.1 MG/GM vaginal cream INSERT 1 GM VAGINALLY TWICE WEEKLY  . hydrochlorothiazide (MICROZIDE) 12.5 MG capsule TAKE 1 CAPSULE DAILY.   SCHEDULE ANNUAL VISIT  . metFORMIN (GLUCOPHAGE) 500 MG tablet Take 1 tablet (500 mg total) by mouth 3 (three) times daily with meals.  . metoprolol succinate (TOPROL-XL) 50 MG 24 hr tablet TAKE 1 TABLET DAILY (NEED APPOINTMENT FOR FUTURE REFILLS)  . Multiple Vitamins-Minerals (PRESERVISION AREDS 2 PO) Take 1 tablet by mouth 2 (two) times daily.  . naproxen sodium  (ALEVE) 220 MG tablet Take 220 mg by mouth 2 (two) times daily as needed (for pain.).  [name] oxybutynin (DITROPAN-XL) 10 MG 24 hr tablet Take 10 mg by mouth daily.  [name] SYNTHROID 137 MCG tablet TAKE 1 TABLET DAILY  . Vitamin D, Ergocalciferol, (DRISDOL) 1.25 MG (50000 UNIT) CAPS capsule Take 1 capsule (50,000 Units total) by mouth every 7 (seven) days.     Allergies:   Neomycin and Ace inhibitors   Social History   Tobacco Use  . Smoking status: Never Smoker  . Smokeless tobacco: Never Used  Substance Use Topics  . Alcohol use: Not Currently  . Drug use: No     Family Hx: The patient's family history includes Alcohol abuse in her father; Alcoholism in her mother; Coronary artery disease in her father; Diabetes in her brother; Hypertension in her brother, father, maternal uncle, and mother; Liver disease in her father; Obesity  in her father; Parkinson's disease in her brother; Stroke in her mother; Sudden death in her mother. There is no history of Colon cancer, Rectal cancer, or Stomach cancer.  ROS:   Please see the history of present illness.     All other systems reviewed and are negative.   Labs/Other Tests and Data Reviewed:    Recent Labs: 01/02/2020: ALT 25; BUN 21; Creatinine, Ser 0.64; Potassium 4.3; Sodium 142; TSH 0.746   Recent Lipid Panel Lab Results  Component Value Date/Time   CHOL 117 01/02/2020 11:55 AM   CHOL 110 11/03/2016 12:00 AM   TRIG 57 01/02/2020 11:55 AM   TRIG 54 11/03/2016 12:00 AM   HDL 53 01/02/2020 11:55 AM   HDL 48 (L) 11/03/2016 12:00 AM   CHOLHDL 2 06/07/2018 07:39 AM   LDLCALC 51 01/02/2020 11:55 AM   LDLCALC 49 11/03/2016 12:00 AM    Wt Readings from Last 3 Encounters:  02/29/20 209 lb (94.8 kg)  02/13/20 209 lb (94.8 kg)  02/08/20 217 lb (98.4 kg)     Objective:    Vital Signs:  BP 132/73   Pulse 61   Ht 4\' 11"  (7.564 m)   Wt 209 lb (94.8 kg)   LMP 02/12/2011   BMI 42.21 kg/m     ASSESSMENT & PLAN:    1.  Coronary artery  calcifications/Aortic atherosclerosis -She has not had any further chest pain or SOB -nuclear stress test showed no ischemia earlier this month -coronary calcium score very high at 1272 -continue statin and ASA  2.  HTN -BP controlled  -continue Cardizem CD 180mg  daily, HCTZ 12.5mg  daily, Toprol XL 50mg  daily -labs were personally reviewed from [facility] showing a Creatinine 0.64 and K+ 4.3 from 01/2020  3.  HLD -LDL goal < 70 -LDL reviewed from [facility] and was 51 in Feb 2021 -ALT was normal at 25 -continue atorvastatin 40mg  daily  4.  Morbid Obesity -she has lost over 30lbs with the [facility] healthy weight loss program -she is continuing on her diet and exercise  5.  Palpitations -they only occur about 2-3 times a year and have not been able to catch them on a heart monitor -she has bought the [employer] monitor to try to catch them and send me the rhythm strips  COVID-19 Education: The signs and symptoms of COVID-19 were discussed with the patient and how to seek care for testing (follow up with PCP or arrange E-visit).  The importance of social distancing was discussed today.  Patient Risk:   After full review of this patient's clinical status, I feel that they are at least moderate risk at this time.  Time:   Today, I have spent 20 minutes on telemedicine discussing medical problems including coronary calcifications, HLD, Palpitations, HTN and reviewing patient's chart including outside labs from PCP, coronary calcium score.  Medication Adjustments/Labs and Tests Ordered: Current medicines are reviewed at length with the patient today.  Concerns regarding medicines are outlined above.  Tests Ordered: No orders of the defined types were placed in this encounter.  Medication Changes: No orders of the defined types were placed in this encounter.   Disposition:  Follow up in 1 year(s)  Signed, Fransico Him, MD  02/29/2020 8:16 AM    [facility]

## 2020-02-29 NOTE — Patient Instructions (Signed)
Medication Instructions:  Your physician recommends that you continue on your current medications as directed. Please refer to the Current Medication list given to you today.  *If you need a refill on your cardiac medications before your next appointment, please call your pharmacy*   Follow-Up: At [facility], you and your health needs are our priority.  As part of our continuing mission to provide you with exceptional heart care, we have created designated Provider Care Teams.  These Care Teams include your primary Cardiologist (physician) and Advanced Practice Providers (APPs -  Physician Assistants and Nurse Practitioners) who all work together to provide you with the care you need, when you need it.  Your next appointment:   1 year(s)  The format for your next appointment:   Either In Person or Virtual  Provider:   Fransico Him, MD

## 2020-03-01 NOTE — Telephone Encounter (Signed)
Please advise 

## 2020-03-04 ENCOUNTER — Other Ambulatory Visit: Payer: Self-pay | Admitting: Cardiology

## 2020-03-05 ENCOUNTER — Other Ambulatory Visit: Payer: Self-pay

## 2020-03-05 ENCOUNTER — Ambulatory Visit (INDEPENDENT_AMBULATORY_CARE_PROVIDER_SITE_OTHER): Payer: Medicare Other | Admitting: Family Medicine

## 2020-03-05 ENCOUNTER — Encounter (INDEPENDENT_AMBULATORY_CARE_PROVIDER_SITE_OTHER): Payer: Self-pay | Admitting: Family Medicine

## 2020-03-05 VITALS — BP 122/74 | HR 54 | Temp 97.8°F | Ht 59.0 in | Wt 213.0 lb

## 2020-03-05 DIAGNOSIS — E559 Vitamin D deficiency, unspecified: Secondary | ICD-10-CM

## 2020-03-05 DIAGNOSIS — E8881 Metabolic syndrome: Secondary | ICD-10-CM

## 2020-03-05 DIAGNOSIS — Z6841 Body Mass Index (BMI) 40.0 and over, adult: Secondary | ICD-10-CM

## 2020-03-05 DIAGNOSIS — E88819 Insulin resistance, unspecified: Secondary | ICD-10-CM

## 2020-03-05 MED ORDER — VITAMIN D (ERGOCALCIFEROL) 1.25 MG (50000 UNIT) PO CAPS: 50000.0000 [IU] | ORAL_CAPSULE | ORAL | 0 refills | Status: DC

## 2020-03-05 NOTE — Progress Notes (Signed)
Chief Complaint:   OBESITY Nichole Smith is here to discuss her progress with her obesity treatment plan along with follow-up of her obesity related diagnoses. Nichole Smith is on keeping a food journal and adhering to recommended goals of 1500-1600 calories and 100 grams of protein daily and states she is following her eating plan approximately 50% of the time. Nichole Smith states she is walking, doing water aerobics, and strength training for 30 minutes 7 times per week.  Today's visit was #: 22 Starting weight: 238 lbs Starting date: 01/12/2019 Today's weight: 213 lbs Today's date: 03/05/2020 Total lbs lost to date: 25 Total lbs lost since last in-office visit: 0  Interim History: Nichole Smith was very much off track over the past several days due to increased carbohydrate intake (Easter candy and food). She plans on starting fresh today. Bioimpedance scale shows >3.0 lbs of water weight gain.  Subjective:   1. Insulin resistance Nichole Smith feels metformin is causing stomach upset and gas. She notes polyphagia. She has stopped the metformin and feels GI discomfort has improved. She would like to continue a smaller dose of metformin however. She has been on 500 mg TID. Lab Results  Component Value Date   HGBA1C 5.5 01/02/2020    2. Vitamin D deficiency Nichole Smith's Vit D level is at goal. She is taking prescription Vit D weekly for 1 month.  Assessment/Plan:   1. Insulin resistance Nichole Smith will continue to work on weight loss, exercise, and decreasing simple carbohydrates to help decrease the risk of diabetes. She will try taking metformin BID, no refill needed. Nichole Smith agreed to follow-up with [name] as directed to closely monitor her progress.  2. Vitamin D deficiency Low Vitamin D level contributes to fatigue and are associated with obesity, breast, and colon cancer. We will refill prescription Vitamin D for 3 months. Nichole Smith will follow-up for routine testing of Vitamin D, at least 2-3 times per year to avoid  over-replacement.  - Vitamin D, Ergocalciferol, (DRISDOL) 1.25 MG (50000 UNIT) CAPS capsule; Take 1 capsule (50,000 Units total) by mouth every 7 (seven) days.  Dispense: 12 capsule; Refill: 0  3. Class 3 severe obesity with serious comorbidity and body mass index (BMI) of 40.0 to 44.9 in adult, unspecified obesity type Nichole Smith) Nichole Smith is currently in the action stage of change. As such, her goal is to continue with weight loss efforts. She has agreed to keeping a food journal and adhering to recommended goals of 1500-1600 calories and 100 grams of protein daily.   Exercise goals: As is.  Behavioral modification strategies: increasing lean protein intake, decreasing simple carbohydrates and keeping a strict food journal.  Nichole Smith has agreed to follow-up with our clinic in 3 weeks. She was informed of the importance of frequent follow-up visits to maximize her success with intensive lifestyle modifications for her multiple health conditions.   Objective:   Blood pressure 122/74, pulse (!) 54, temperature 97.8 F (36.6 C), temperature source Oral, height 4\' 11"  (1.499 m), weight 213 lb (96.6 kg), last menstrual period 02/12/2011, SpO2 98 %. Body mass index is 43.02 kg/m.  General: Cooperative, alert, well developed, in no acute distress. HEENT: Conjunctivae and lids unremarkable. Cardiovascular: Regular rhythm.  Lungs: Normal work of breathing. Neurologic: No focal deficits.   Lab Results  Component Value Date   CREATININE 0.64 01/02/2020   BUN 21 01/02/2020   NA 142 01/02/2020   K 4.3 01/02/2020   CL 101 01/02/2020   CO2 27 01/02/2020   Lab Results  Component Value Date   ALT 25 01/02/2020   AST 23 01/02/2020   ALKPHOS 79 01/02/2020   BILITOT 0.4 01/02/2020   Lab Results  Component Value Date   HGBA1C 5.5 01/02/2020   HGBA1C 5.6 08/22/2019   HGBA1C 5.9 (H) 01/12/2019   HGBA1C 5.9 06/07/2018   Lab Results  Component Value Date   INSULIN 13.5 01/02/2020   INSULIN 21.5  08/22/2019   INSULIN 20.4 01/12/2019   Lab Results  Component Value Date   TSH 0.746 01/02/2020   Lab Results  Component Value Date   CHOL 117 01/02/2020   HDL 53 01/02/2020   LDLCALC 51 01/02/2020   TRIG 57 01/02/2020   CHOLHDL 2 06/07/2018   Lab Results  Component Value Date   WBC 7.8 01/12/2019   HGB 16.3 (H) 01/12/2019   HCT 47.8 (H) 01/12/2019   MCV 91 01/12/2019   PLT 228.0 06/07/2018   No results found for: IRON, TIBC, FERRITIN  Obesity Behavioral Intervention Documentation for Insurance:   Approximately 15 minutes were spent on the discussion below.  ASK: We discussed the diagnosis of obesity with Nichole Smith today and Nichole Smith agreed to give [name] permission to discuss obesity behavioral modification therapy today.  ASSESS: Nichole Smith has the diagnosis of obesity and her BMI today is 23. Nichole Smith is in the action stage of change.   ADVISE: Marsheila was educated on the multiple health risks of obesity as well as the benefit of weight loss to improve her health. She was advised of the need for long term treatment and the importance of lifestyle modifications to improve her current health and to decrease her risk of future health problems.  AGREE: Multiple dietary modification options and treatment options were discussed and Katerina agreed to follow the recommendations documented in the above note.  ARRANGE: Marianela was educated on the importance of frequent visits to treat obesity as outlined per CMS and USPSTF guidelines and agreed to schedule her next follow up appointment today.  Attestation Statements:   Reviewed by clinician on day of visit: allergies, medications, problem list, medical history, surgical history, family history, social history, and previous encounter notes.   [name], am acting as Location manager for [employer], FNP-C.  I have reviewed the above documentation for accuracy and completeness, and I agree with the above. -  [name], FNP

## 2020-03-10 ENCOUNTER — Encounter: Payer: Self-pay | Admitting: Obstetrics & Gynecology

## 2020-03-11 ENCOUNTER — Other Ambulatory Visit: Payer: Self-pay | Admitting: Obstetrics & Gynecology

## 2020-03-11 MED ORDER — BETAMETHASONE DIPROPIONATE 0.05 % EX OINT: TOPICAL_OINTMENT | CUTANEOUS | 1 refills | Status: DC

## 2020-03-11 MED ORDER — ESTRADIOL 0.1 MG/GM VA CREA: TOPICAL_CREAM | VAGINAL | 3 refills | Status: DC

## 2020-03-26 ENCOUNTER — Ambulatory Visit (INDEPENDENT_AMBULATORY_CARE_PROVIDER_SITE_OTHER): Payer: Medicare Other | Admitting: Family Medicine

## 2020-04-03 ENCOUNTER — Ambulatory Visit (INDEPENDENT_AMBULATORY_CARE_PROVIDER_SITE_OTHER): Payer: Medicare Other | Admitting: Family Medicine

## 2020-04-03 ENCOUNTER — Encounter (INDEPENDENT_AMBULATORY_CARE_PROVIDER_SITE_OTHER): Payer: Self-pay | Admitting: Family Medicine

## 2020-04-03 ENCOUNTER — Other Ambulatory Visit: Payer: Self-pay

## 2020-04-03 VITALS — BP 128/58 | HR 61 | Temp 98.1°F | Ht 59.0 in | Wt 211.0 lb

## 2020-04-03 DIAGNOSIS — I10 Essential (primary) hypertension: Secondary | ICD-10-CM | POA: Diagnosis not present

## 2020-04-03 DIAGNOSIS — Z6841 Body Mass Index (BMI) 40.0 and over, adult: Secondary | ICD-10-CM

## 2020-04-03 DIAGNOSIS — E8881 Metabolic syndrome: Secondary | ICD-10-CM | POA: Diagnosis not present

## 2020-04-03 MED ORDER — RYBELSUS 3 MG PO TABS: 3.0000 mg | ORAL_TABLET | Freq: Every day | ORAL | 0 refills | Status: DC

## 2020-04-03 NOTE — Progress Notes (Signed)
Chief Complaint:   OBESITY Nichole Smith is here to discuss her progress with her obesity treatment plan along with follow-up of her obesity related diagnoses. Nichole Smith is on keeping a food journal and adhering to recommended goals of 1500-1600 calories and 100 grams of protein daily and states she is following her eating plan approximately 70% of the time. Nichole Smith states she is walking, swimming, and lifting weights for 30 minutes 7 times per week.  Today's visit was #: 23 Starting weight: 238 lbs Starting date: 01/12/2019 Today's weight: 211 lbs Today's date: 04/03/2020 Total lbs lost to date: 27 Total lbs lost since last in-office visit: 2  Interim History: Nichole Smith went to the beach recently and ate off the plan. She is journaling consistently, but she has also had recent social functions which got her off the plan.  Subjective:   1. Insulin resistance Nichole Smith went off of metformin due to diarrhea. She started back on with 1 tablet per day and seems to be tolerating it ok.   2. Essential hypertension Nichole Smith's blood pressure is well controlled on Cardizem, hydrochlorothiazide, and metoprolol. BP Readings from Last 3 Encounters:  04/03/20 (!) 128/58  03/05/20 122/74  02/29/20 132/73    Assessment/Plan:   1. Insulin resistance Nichole Smith will continue to work on weight loss, exercise, and decreasing simple carbohydrates to help decrease the risk of diabetes. Nichole Smith agreed to start Rybelsus 3 mg q AM with no refills; she will continue metformin with 1 tablet per day, then stop if she is starts Rybelsus. We are unsure if Medicare will pay for it. Nichole Smith agreed to follow-up with [name] as directed to closely monitor her progress.  - Semaglutide (RYBELSUS) 3 MG TABS; Take 3 mg by mouth daily before breakfast.  Dispense: 30 tablet; Refill: 0  2. Essential hypertension Nichole Smith will continue her medications, and will continue to work on healthy weight loss and exercise to improve blood pressure control. We will  watch for signs of hypotension as she continues her lifestyle modifications.  3. Class 3 severe obesity with serious comorbidity and body mass index (BMI) of 40.0 to 44.9 in adult, unspecified obesity type [facility]) Nichole Smith is currently in the action stage of change. As such, her goal is to continue with weight loss efforts. She has agreed to keeping a food journal and adhering to recommended goals of 1500-1600 calories and 100 grams of protein daily.   Exercise goals: As is.  Behavioral modification strategies: increasing lean protein intake, decreasing simple carbohydrates and keeping a strict food journal.  Nichole Smith has agreed to follow-up with our clinic in 3 weeks. She was informed of the importance of frequent follow-up visits to maximize her success with intensive lifestyle modifications for her multiple health conditions.   Objective:   Blood pressure (!) 128/58, pulse 61, temperature 98.1 F (36.7 C), temperature source Oral, height 4\' 11"  (1.499 m), weight 211 lb (95.7 kg), last menstrual period 02/12/2011, SpO2 95 %. Body mass index is 42.62 kg/m.  General: Cooperative, alert, well developed, in no acute distress. HEENT: Conjunctivae and lids unremarkable. Cardiovascular: Regular rhythm.  Lungs: Normal work of breathing. Neurologic: No focal deficits.   Lab Results  Component Value Date   CREATININE 0.64 01/02/2020   BUN 21 01/02/2020   NA 142 01/02/2020   K 4.3 01/02/2020   CL 101 01/02/2020   CO2 27 01/02/2020   Lab Results  Component Value Date   ALT 25 01/02/2020   AST 23 01/02/2020   ALKPHOS 79  01/02/2020   BILITOT 0.4 01/02/2020   Lab Results  Component Value Date   HGBA1C 5.5 01/02/2020   HGBA1C 5.6 08/22/2019   HGBA1C 5.9 (H) 01/12/2019   HGBA1C 5.9 06/07/2018   Lab Results  Component Value Date   INSULIN 13.5 01/02/2020   INSULIN 21.5 08/22/2019   INSULIN 20.4 01/12/2019   Lab Results  Component Value Date   TSH 0.746 01/02/2020   Lab Results    Component Value Date   CHOL 117 01/02/2020   HDL 53 01/02/2020   LDLCALC 51 01/02/2020   TRIG 57 01/02/2020   CHOLHDL 2 06/07/2018   Lab Results  Component Value Date   WBC 7.8 01/12/2019   HGB 16.3 (H) 01/12/2019   HCT 47.8 (H) 01/12/2019   MCV 91 01/12/2019   PLT 228.0 06/07/2018   No results found for: IRON, TIBC, FERRITIN  Obesity Behavioral Intervention Documentation for Insurance:   Approximately 15 minutes were spent on the discussion below.  ASK: We discussed the diagnosis of obesity with [name] today and Novali agreed to give [name] permission to discuss obesity behavioral modification therapy today.  ASSESS: Ailene has the diagnosis of obesity and her BMI today is 42.59. Siona is in the action stage of change.   ADVISE: Taylour was educated on the multiple health risks of obesity as well as the benefit of weight loss to improve her health. She was advised of the need for long term treatment and the importance of lifestyle modifications to improve her current health and to decrease her risk of future health problems.  AGREE: Multiple dietary modification options and treatment options were discussed and Shaquetta agreed to follow the recommendations documented in the above note.  ARRANGE: Larken was educated on the importance of frequent visits to treat obesity as outlined per CMS and USPSTF guidelines and agreed to schedule her next follow up appointment today.  Attestation Statements:   Reviewed by clinician on day of visit: allergies, medications, problem list, medical history, surgical history, family history, social history, and previous encounter notes.   [name], am acting as Location manager for [employer], FNP-C.  I have reviewed the above documentation for accuracy and completeness, and I agree with the above. -  [name], FNP

## 2020-04-04 ENCOUNTER — Encounter (INDEPENDENT_AMBULATORY_CARE_PROVIDER_SITE_OTHER): Payer: Self-pay

## 2020-04-10 ENCOUNTER — Ambulatory Visit (INDEPENDENT_AMBULATORY_CARE_PROVIDER_SITE_OTHER): Payer: Medicare Other

## 2020-04-10 ENCOUNTER — Ambulatory Visit (INDEPENDENT_AMBULATORY_CARE_PROVIDER_SITE_OTHER): Payer: Medicare Other | Admitting: Sports Medicine

## 2020-04-10 ENCOUNTER — Other Ambulatory Visit: Payer: Self-pay

## 2020-04-10 ENCOUNTER — Encounter: Payer: Self-pay | Admitting: Sports Medicine

## 2020-04-10 VITALS — BP 155/76 | HR 65 | Temp 97.7°F | Resp 16

## 2020-04-10 DIAGNOSIS — M205X9 Other deformities of toe(s) (acquired), unspecified foot: Secondary | ICD-10-CM

## 2020-04-10 DIAGNOSIS — M2141 Flat foot [pes planus] (acquired), right foot: Secondary | ICD-10-CM

## 2020-04-10 DIAGNOSIS — M2142 Flat foot [pes planus] (acquired), left foot: Secondary | ICD-10-CM | POA: Diagnosis not present

## 2020-04-10 DIAGNOSIS — M19071 Primary osteoarthritis, right ankle and foot: Secondary | ICD-10-CM

## 2020-04-10 DIAGNOSIS — M79671 Pain in right foot: Secondary | ICD-10-CM

## 2020-04-10 DIAGNOSIS — I2584 Coronary atherosclerosis due to calcified coronary lesion: Secondary | ICD-10-CM

## 2020-04-10 DIAGNOSIS — M19072 Primary osteoarthritis, left ankle and foot: Secondary | ICD-10-CM

## 2020-04-10 DIAGNOSIS — M79672 Pain in left foot: Secondary | ICD-10-CM

## 2020-04-10 DIAGNOSIS — I2510 Atherosclerotic heart disease of native coronary artery without angina pectoris: Secondary | ICD-10-CM

## 2020-04-10 NOTE — Progress Notes (Signed)
Subjective: Nichole Smith is a 64 y.o. female patient who presents to office for evaluation of right and left foot pain. Patient complains of progressive pain especially over the last several years in both feet that has gradually gotten worse especially over the big toe joints that radiates sometimes to the top of the foot across right is worse reports that she did go to a sports medicine doctor in the past that recommended some Spenco insoles and changing up her shoes which helps but sometimes after walking she has some achy pain over the top of the foot and the joints feel tight unable to bend the big toes.  Patient denies any increase in redness warmth swelling or drainage. Denies injury/trip/fall/sprain/any causative factors.   Admits to seeing a sports medicine doctor in the past for history of Achilles tears.  Review of Systems  All other systems reviewed and are negative.   Patient Active Problem List   Diagnosis Date Noted  . Insulin resistance 03/05/2020  . Vitamin D deficiency 08/25/2019  . Prediabetes 08/25/2019  . Acute pain of right knee 09/28/2018  . Lichen planus 97/67/3419  . Cervical stenosis of spinal canal 12/24/2017  . Hyperlipidemia LDL goal <70 08/26/2017  . Heart palpitations 08/26/2017  . Hammertoe of right foot 01/06/2017  . Coronary artery calcification 07/23/2016  . Periodic limb movements of sleep 06/18/2016  . Atherosclerotic plaque 06/11/2016  . Piriformis syndrome of left side 10/24/2015  . Loss of transverse plantar arch of left foot 09/12/2015  . 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 Obstructive sleep apnea 07/27/2014  . Midline low back pain without sciatica 06/20/2014  . Polycythemia, secondary 03/22/2014  . Severe obesity (BMI >= 40) ([place]) 02/22/2014  . Elevated hemoglobin ([place]) 02/22/2014  . Left shoulder pain 02/04/2013  . 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 HNP (herniated nucleus pulposus), cervical 01/22/2011  . Visit for preventive  health examination 01/22/2011  . PAIN IN JOINT, MULTIPLE SITES 01/21/2010  . Hypothyroidism 10/16/2008  . Class 3 severe obesity with serious comorbidity and body mass index (BMI) of 40.0 to 44.9 in adult ([place]) 01/07/2008  . PALPITATIONS, RECURRENT 01/07/2008  . Essential hypertension 06/25/2007  . OSTEOARTHRITIS 06/25/2007    Current Outpatient Medications on File Prior to Visit  Medication Sig Dispense Refill  . aspirin EC 81 MG tablet Take 81 mg by mouth at bedtime.    [name] atorvastatin (LIPITOR) 40 MG tablet Take 1 tablet (40 mg total) by mouth daily. 90 tablet 3  . betamethasone dipropionate (DIPROLENE) 0.05 % ointment Do not use more than 5 days in a row or more than 7 days each month. 45 g 1  . CALCIUM PO Take 1 tablet by mouth at bedtime.     [name] diltiazem (CARDIZEM CD) 180 MG 24 hr capsule Take 1 capsule (180 mg total) by mouth daily. 90 capsule 3  . estradiol (ESTRACE VAGINAL) 0.1 MG/GM vaginal cream INSERT 1 GM VAGINALLY TWICE WEEKLY 42.5 g 3  . hydrochlorothiazide (MICROZIDE) 12.5 MG capsule TAKE 1 CAPSULE DAILY.   SCHEDULE ANNUAL VISIT 90 capsule 3  . metFORMIN (GLUCOPHAGE) 500 MG tablet Take 1 tablet (500 mg total) by mouth 3 (three) times daily with meals. 270 tablet 0  . metoprolol succinate (TOPROL-XL) 50 MG 24 hr tablet TAKE 1 TABLET DAILY (NEED APPOINTMENT FOR FUTURE REFILLS) 90 tablet 3  . Multiple Vitamins-Minerals (PRESERVISION AREDS 2 PO) Take 1 tablet by mouth 2 (two) times daily.    . naproxen sodium (ALEVE) 220  MG tablet Take 220 mg by mouth 2 (two) times daily as needed (for pain.).    [name] oxybutynin (DITROPAN-XL) 10 MG 24 hr tablet Take 10 mg by mouth daily.    [name] SYNTHROID 137 MCG tablet TAKE 1 TABLET DAILY 90 tablet 3  . Vitamin D, Ergocalciferol, (DRISDOL) 1.25 MG (50000 UNIT) CAPS capsule Take 1 capsule (50,000 Units total) by mouth every 7 (seven) days. 12 capsule 0   No current facility-administered medications on file prior to visit.    Allergies  Allergen  Reactions  . Neomycin Other (See Comments)    Inflammation--eye drop   . Ace Inhibitors Cough    Objective:  General: Alert and oriented x3 in no acute distress  Dermatology: No open lesions bilateral lower extremities, no webspace macerations, no ecchymosis bilateral, all nails x 10 are well manicured.  Vascular: Dorsalis Pedis and Posterior Tibial pedal pulses palpable, Capillary Fill Time 3 seconds,(+) pedal hair growth bilateral, no edema bilateral lower extremities, Temperature gradient within normal limits.  Neurology: Johney Maine sensation intact via light touch bilateral.   Musculoskeletal: Mild tenderness with palpation at first metatarsophalangeal joints with decreased first metatarsal range of motion right>Left with functional limitus noted on weightbearing exam as well as pes planus foot type. Strength within normal limits in all groups bilateral.   Gait: Antalgic gait  Xrays  Right and left foot   Impression: Squaring of the first metatarsal head consistent with changes of hallux limitus, midtarsal breech supportive of pes planus, calcaneal heel spurs.  No other acute findings.  Assessment and Plan: Problem List Items Addressed This Visit    None    Visit Diagnoses    Pain in both feet    -  Primary   Relevant Orders   DG Foot Complete Right   DG Foot Complete Left   Hallux limitus, unspecified laterality       Primary osteoarthritis of both feet       Pes planus of both feet           -Complete examination performed -Xrays reviewed -Discussed treatment options for hallux limitus and bilateral foot pain -Recommend good supportive shoes daily for foot type and over-the-counter super feet orthotics advised patient she may benefit from custom orthotics as well -Recommend rest ice elevation as well as soaking and topical pain creams or rubs as needed -Recommend over-the-counter Tylenol arthritis as needed -Patient to return to office as needed or sooner if condition  worsens.  Landis Martins, DPM

## 2020-04-10 NOTE — Patient Instructions (Addendum)
For tennis shoes recommend:  Kandy Garrison Ascis New balance HOKA Can be purchased at [employer] or [employer]  Vionic  SAS Can be purchased at [employer] or [employer]   For work shoes recommend: [employer] Work [employer]  Can be purchased at a variety of places or Engineer, maintenance (IT)   For casual shoes recommend: Vionic  Can be purchased at [employer] or [place] recommend: Power Steps Can be purchased in office/[employer] and [place] Can be purchased at [employer] sports or [employer] Can be purchased at [employer]

## 2020-04-12 ENCOUNTER — Other Ambulatory Visit: Payer: Self-pay | Admitting: Sports Medicine

## 2020-04-12 DIAGNOSIS — M19071 Primary osteoarthritis, right ankle and foot: Secondary | ICD-10-CM

## 2020-04-12 DIAGNOSIS — M2142 Flat foot [pes planus] (acquired), left foot: Secondary | ICD-10-CM

## 2020-04-12 DIAGNOSIS — M205X9 Other deformities of toe(s) (acquired), unspecified foot: Secondary | ICD-10-CM

## 2020-04-12 DIAGNOSIS — M2141 Flat foot [pes planus] (acquired), right foot: Secondary | ICD-10-CM

## 2020-04-23 ENCOUNTER — Ambulatory Visit (INDEPENDENT_AMBULATORY_CARE_PROVIDER_SITE_OTHER): Payer: Medicare Other | Admitting: Family Medicine

## 2020-04-23 ENCOUNTER — Other Ambulatory Visit: Payer: Self-pay

## 2020-04-23 ENCOUNTER — Encounter (INDEPENDENT_AMBULATORY_CARE_PROVIDER_SITE_OTHER): Payer: Self-pay | Admitting: Family Medicine

## 2020-04-23 VITALS — BP 125/76 | HR 65 | Temp 98.0°F | Ht 59.0 in | Wt 216.0 lb

## 2020-04-23 DIAGNOSIS — Z6841 Body Mass Index (BMI) 40.0 and over, adult: Secondary | ICD-10-CM

## 2020-04-23 DIAGNOSIS — E8881 Metabolic syndrome: Secondary | ICD-10-CM

## 2020-04-23 DIAGNOSIS — E559 Vitamin D deficiency, unspecified: Secondary | ICD-10-CM

## 2020-04-23 MED ORDER — OZEMPIC (0.25 OR 0.5 MG/DOSE) 2 MG/1.5ML ~~LOC~~ SOPN: 0.2500 mg | PEN_INJECTOR | SUBCUTANEOUS | 0 refills | Status: DC

## 2020-04-23 NOTE — Progress Notes (Signed)
Chief Complaint:   OBESITY Nichole Smith is here to discuss her progress with her obesity treatment plan along with follow-up of her obesity related diagnoses. Nichole Smith is on keeping a food journal and adhering to recommended goals of 1500-1600 calories and 100 grams of protein daily and states she is following her eating plan approximately 50% of the time. Nichole Smith states she is walking for 15 minutes 7 times per week.  Today's visit was #: 24 Starting weight: 238 lbs Starting date: 01/12/2019 Today's weight: 216 lbs Today's date: 04/23/2020 Total lbs lost to date: 22 Total lbs lost since last in-office visit: 0  Interim History: Nichole Smith notes increased socializing over the past few weeks and she has been off the plan. She notes excessive hunger. She reports she has been eating too many strawberries and drinking too much diet soda and wine.  Subjective:   1. Insulin resistance Nichole Smith has a diagnosis of insulin resistance based on her elevated fasting insulin level >5. She is on metformin 500 mg BID but still has polyphagia. Rybelsus which was prescribed at last OV was not covered by insurance.   Lab Results  Component Value Date   INSULIN 13.5 01/02/2020   INSULIN 21.5 08/22/2019   INSULIN 20.4 01/12/2019   Lab Results  Component Value Date   HGBA1C 5.5 01/02/2020   2. Vitamin D deficiency Nichole Smith's last Vit D level is at goal at 63.9. She is on prescription Vit D.  Assessment/Plan:   1. Insulin resistance Nichole Smith will continue to work on weight loss, exercise, and decreasing simple carbohydrates to help decrease the risk of diabetes. Nichole Smith agreed to start Ozempic at 0.25 mg SubQ q weekly #1 pen with no refills, and she will increase metformin to 500 mg 2 tablets at lunch and 1 tablet at dinner.  Nichole Smith agreed to follow-up with [name] as directed to closely monitor her progress.  - Semaglutide,0.25 or 0.5MG /DOS, (OZEMPIC, 0.25 OR 0.5 MG/DOSE,) 2 MG/1.5ML SOPN; Inject 0.25 mg into the skin once a  week.  Dispense: 1 pen; Refill: 0  2. Vitamin D deficiency Low Vitamin D level contributes to fatigue and are associated with obesity, breast, and colon cancer. Nichole Smith will follow-up for routine testing of Vitamin D, at least 2-3 times per year to avoid over-replacement. We will recheck labs at her next office visit.  3. Class 3 severe obesity with serious comorbidity and body mass index (BMI) of 40.0 to 44.9 in adult, unspecified obesity type Nichole Smith) Nichole Smith is currently in the action stage of change. As such, her goal is to continue with weight loss efforts. She has agreed to keeping a food journal and adhering to recommended goals of 1500-1600 calories and 100 grams of protein daily.   We discussed decreasing carbohydrates overall.  Exercise goals: As is.  Behavioral modification strategies: decreasing simple carbohydrates.  Nichole Smith has agreed to follow-up with our clinic in 3 weeks. She was informed of the importance of frequent follow-up visits to maximize her success with intensive lifestyle modifications for her multiple health conditions.   Objective:   Blood pressure 125/76, pulse 65, temperature 98 F (36.7 C), temperature source Oral, height 4\' 11"  (1.499 m), weight 216 lb (98 kg), last menstrual period 02/12/2011, SpO2 96 %. Body mass index is 43.63 kg/m.  General: Cooperative, alert, well developed, in no acute distress. HEENT: Conjunctivae and lids unremarkable. Cardiovascular: Regular rhythm.  Lungs: Normal work of breathing. Neurologic: No focal deficits.   Lab Results  Component Value Date  CREATININE 0.64 01/02/2020   BUN 21 01/02/2020   NA 142 01/02/2020   K 4.3 01/02/2020   CL 101 01/02/2020   CO2 27 01/02/2020   Lab Results  Component Value Date   ALT 25 01/02/2020   AST 23 01/02/2020   ALKPHOS 79 01/02/2020   BILITOT 0.4 01/02/2020   Lab Results  Component Value Date   HGBA1C 5.5 01/02/2020   HGBA1C 5.6 08/22/2019   HGBA1C 5.9 (H) 01/12/2019   HGBA1C  5.9 06/07/2018   Lab Results  Component Value Date   INSULIN 13.5 01/02/2020   INSULIN 21.5 08/22/2019   INSULIN 20.4 01/12/2019   Lab Results  Component Value Date   TSH 0.746 01/02/2020   Lab Results  Component Value Date   CHOL 117 01/02/2020   HDL 53 01/02/2020   LDLCALC 51 01/02/2020   TRIG 57 01/02/2020   CHOLHDL 2 06/07/2018   Lab Results  Component Value Date   WBC 7.8 01/12/2019   HGB 16.3 (H) 01/12/2019   HCT 47.8 (H) 01/12/2019   MCV 91 01/12/2019   PLT 228.0 06/07/2018   No results found for: IRON, TIBC, FERRITIN  Obesity Behavioral Intervention Documentation for Insurance:   Approximately 15 minutes were spent on the discussion below.  ASK: We discussed the diagnosis of obesity with [name] today and [name] agreed to give [name] permission to discuss obesity behavioral modification therapy today.  ASSESS: [name] has the diagnosis of obesity and her BMI today is 43.6. [name] is in the action stage of change.   ADVISE: [name] was educated on the multiple health risks of obesity as well as the benefit of weight loss to improve her health. She was advised of the need for long term treatment and the importance of lifestyle modifications to improve her current health and to decrease her risk of future health problems.  AGREE: Multiple dietary modification options and treatment options were discussed and [name] agreed to follow the recommendations documented in the above note.  ARRANGE: [name] was educated on the importance of frequent visits to treat obesity as outlined per CMS and USPSTF guidelines and agreed to schedule her next follow up appointment today.  Attestation Statements:   Reviewed by clinician on day of visit: allergies, medications, problem list, medical history, surgical history, family history, social history, and previous encounter notes.   [name], am acting as Location manager for [employer], FNP-C.  I have reviewed the above  documentation for accuracy and completeness, and I agree with the above. -  [name], FNP

## 2020-04-24 ENCOUNTER — Encounter (INDEPENDENT_AMBULATORY_CARE_PROVIDER_SITE_OTHER): Payer: Self-pay | Admitting: Family Medicine

## 2020-04-26 ENCOUNTER — Encounter (INDEPENDENT_AMBULATORY_CARE_PROVIDER_SITE_OTHER): Payer: Self-pay

## 2020-04-26 ENCOUNTER — Other Ambulatory Visit (INDEPENDENT_AMBULATORY_CARE_PROVIDER_SITE_OTHER): Payer: Self-pay | Admitting: Family Medicine

## 2020-04-26 DIAGNOSIS — E88819 Insulin resistance, unspecified: Secondary | ICD-10-CM

## 2020-04-26 DIAGNOSIS — E8881 Metabolic syndrome: Secondary | ICD-10-CM

## 2020-04-26 MED ORDER — TRULICITY 0.75 MG/0.5ML ~~LOC~~ SOAJ: 0.7500 mg | SUBCUTANEOUS | 0 refills | Status: DC

## 2020-04-26 NOTE — Addendum Note (Signed)
Addended by: [name] on: 04/26/2020 03:56 PM   Modules accepted: Orders

## 2020-05-21 ENCOUNTER — Ambulatory Visit (INDEPENDENT_AMBULATORY_CARE_PROVIDER_SITE_OTHER): Payer: Medicare Other | Admitting: Family Medicine

## 2020-05-23 ENCOUNTER — Encounter: Payer: Self-pay | Admitting: Internal Medicine

## 2020-05-23 ENCOUNTER — Other Ambulatory Visit: Payer: Self-pay

## 2020-05-23 ENCOUNTER — Telehealth (INDEPENDENT_AMBULATORY_CARE_PROVIDER_SITE_OTHER): Payer: Medicare Other | Admitting: Internal Medicine

## 2020-05-23 VITALS — BP 140/75 | HR 63 | Temp 96.8°F | Ht 59.0 in | Wt 211.0 lb

## 2020-05-23 DIAGNOSIS — J0100 Acute maxillary sinusitis, unspecified: Secondary | ICD-10-CM | POA: Diagnosis not present

## 2020-05-23 DIAGNOSIS — I2510 Atherosclerotic heart disease of native coronary artery without angina pectoris: Secondary | ICD-10-CM | POA: Diagnosis not present

## 2020-05-23 DIAGNOSIS — I2584 Coronary atherosclerosis due to calcified coronary lesion: Secondary | ICD-10-CM

## 2020-05-23 DIAGNOSIS — J069 Acute upper respiratory infection, unspecified: Secondary | ICD-10-CM | POA: Diagnosis not present

## 2020-05-23 MED ORDER — AMOXICILLIN-POT CLAVULANATE 875-125 MG PO TABS: 1.0000 | ORAL_TABLET | Freq: Two times a day (BID) | ORAL | 0 refills | Status: DC

## 2020-05-23 NOTE — Progress Notes (Signed)
Virtual Visit via Video Note  I connected with@ on 05/23/20 at  2:30 PM EDT by a video enabled telemedicine application and verified that I am speaking with the correct person using two identifiers. Location patient: home Location provider:work  office Persons participating in the virtual visit: patient, provider  [place] national recommendations  regarding COVID 19 pandemic   video visit is advised over in office visit for this patient.  Patient aware  of the limitations of evaluation and management by telemedicine and  availability of in person appointments. and agreed to proceed.   HPI: Nichole Smith presents for video visit  8th day   Of a cold   Worse every day  Right side  Face  Like a sinus infection still clear  Discharge.   sense of smell gone .  Monday test covid negative and had vaccine  Cough since yesterday  Throat  .  No fever   Body aches.  Worse part is right cheek pain and sensitivity   And congestion min relief with sudafed and afrin.    No resp infedtion cold for a while  ROS: See pertinent positives and negatives per HPI.  Past Medical History:  Diagnosis Date  . Arthritis    knees, back  . Cancer ([place])   . Carcinoid tumor of lung 2012   right found incidentally on chest xray eval for r/o vasxculitis   . Carpal tunnel syndrome, bilateral   . Coronary artery calcification 07/23/2016  . Double vision    history, no current problem  . Dysrhythmia    irregular occassional, no current problem  . Frequent UTI   . HNP (herniated nucleus pulposus), cervical 01/22/2011   C6-C7  Has seen dr Vertell Limber for this and Dr Tonita Cong  Positional numbness in hands no weakness    . Hypertension   . Hypothyroidism   . Infertility, female   . 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 Obstructive sleep apnea 07/27/2014   NPSG 07/2014:  AHI 85/hr, optimal cpap 10cm.  Download 08/2014:  Good compliance, breakthru apnea on 10cm.  Changed to auto 5-12cm >> good control of AHI on f/u  download ONO on CPAP:      . Osteoarthritis   . PCOS (polycystic ovarian syndrome)   . SUI (stress urinary incontinence, female)    h/o    Past Surgical History:  Procedure Laterality Date  . ANTERIOR CERVICAL DECOMP/DISCECTOMY FUSION  12/24/2017   Procedure: Cervical five-six Cervical six-seven Anterior cervical decompression/discectomy/fusion;  Surgeon: Erline Levine, MD;  Location: [facility];  Service: Neurosurgery;;  . Wilmon Pali RELEASE Left 12/24/2017   Procedure: LEFT CARPAL TUNNEL RELEASE;  Surgeon: Erline Levine, MD;  Location: [facility];  Service: Neurosurgery;  Laterality: Left;  . CESAREAN SECTION     x 1 - twins  . COLONOSCOPY    . DILATATION & CURRETTAGE/HYSTEROSCOPY WITH RESECTOCOPE N/A 07/18/2013   Procedure: DILATATION & CURETTAGE/HYSTEROSCOPY WITH RESECTOCOPE;  Surgeon: Lyman Speller, MD;  Location: [facility];  Service: Gynecology;  Laterality: N/A;  mass resection  . Lung tumor removed     Rt lung carcinoid  . MOUTH SURGERY     pre-cancerous ulcer removed  . SHOULDER OPEN ROTATOR CUFF REPAIR  07/20/2010   Right  . WISDOM TOOTH EXTRACTION      Family History  Problem Relation Age of Onset  . Stroke Mother        died age 73  . Hypertension Mother   . Sudden death Mother   .  Alcoholism Mother   . Coronary artery disease Father        died age 46  . Hypertension Father   . Liver disease Father   . Alcohol abuse Father   . Obesity Father   . Parkinson's disease Brother   . Diabetes Brother   . Hypertension Brother   . Hypertension Maternal Uncle   . Colon cancer Neg Hx   . Rectal cancer Neg Hx   . Stomach cancer Neg Hx     Social History   Tobacco Use  . Smoking status: Never Smoker  . Smokeless tobacco: Never Used  Vaping Use  . Vaping Use: Never used  Substance Use Topics  . Alcohol use: Not Currently  . Drug use: No      Current Outpatient Medications:  .  aspirin EC 81 MG tablet, Take 81 mg by mouth at bedtime., Disp: , Rfl:  .   atorvastatin (LIPITOR) 40 MG tablet, Take 1 tablet (40 mg total) by mouth daily., Disp: 90 tablet, Rfl: 3 .  betamethasone dipropionate (DIPROLENE) 0.05 % ointment, Do not use more than 5 days in a row or more than 7 days each month., Disp: 45 g, Rfl: 1 .  CALCIUM PO, Take 1 tablet by mouth at bedtime. , Disp: , Rfl:  .  diltiazem (CARDIZEM CD) 180 MG 24 hr capsule, Take 1 capsule (180 mg total) by mouth daily., Disp: 90 capsule, Rfl: 3 .  Dulaglutide (TRULICITY) 1.91 YN/8.2NF SOPN, Inject 0.75 mg into the skin once a week., Disp: 4 pen, Rfl: 0 .  estradiol (ESTRACE VAGINAL) 0.1 MG/GM vaginal cream, INSERT 1 GM VAGINALLY TWICE WEEKLY, Disp: 42.5 g, Rfl: 3 .  hydrochlorothiazide (MICROZIDE) 12.5 MG capsule, TAKE 1 CAPSULE DAILY.   SCHEDULE ANNUAL VISIT, Disp: 90 capsule, Rfl: 3 .  metoprolol succinate (TOPROL-XL) 50 MG 24 hr tablet, TAKE 1 TABLET DAILY (NEED APPOINTMENT FOR FUTURE REFILLS), Disp: 90 tablet, Rfl: 3 .  Multiple Vitamins-Minerals (PRESERVISION AREDS 2 PO), Take 1 tablet by mouth 2 (two) times daily., Disp: , Rfl:  .  naproxen sodium (ALEVE) 220 MG tablet, Take 220 mg by mouth 2 (two) times daily as needed (for pain.)., Disp: , Rfl:  .  SYNTHROID 137 MCG tablet, TAKE 1 TABLET DAILY, Disp: 90 tablet, Rfl: 3 .  Vitamin D, Ergocalciferol, (DRISDOL) 1.25 MG (50000 UNIT) CAPS capsule, Take 1 capsule (50,000 Units total) by mouth every 7 (seven) days., Disp: 12 capsule, Rfl: 0 .  amoxicillin-clavulanate (AUGMENTIN) 875-125 MG tablet, Take 1 tablet by mouth every 12 (twelve) hours. For sinusitis, Disp: 14 tablet, Rfl: 0 .  metFORMIN (GLUCOPHAGE) 500 MG tablet, Take 1 tablet (500 mg total) by mouth 3 (three) times daily with meals. (Patient not taking: Reported on 05/23/2020), Disp: 270 tablet, Rfl: 0  EXAM: BP Readings from Last 3 Encounters:  05/23/20 140/75  04/23/20 125/76  04/10/20 (!) 155/76    VITALS per patient if applicable:  GENERAL: alert, oriented, appears well and in no acute  distress very congested no facial edema  Tender shen touches right cheek maxilla area   HEENT: atraumatic, conjunttiva clear, no obvious abnormalities on inspection of external nose and ears NECK: normal movements of the head and neck LUNGS: on inspection no signs of respiratory distress, breathing rate appears normal, no obvious gross SOB, gasping or wheezing CV: no obvious cyanosis  PSYCH/NEURO: pleasant and cooperative, no obvious depression or anxiety, speech and thought processing grossly intact   ASSESSMENT AND PLAN:  Discussed  the following assessment and plan:    ICD-10-CM   1. Acute maxillary sinusitis, recurrence not specified  J01.00   2. URI, acute  P01.4    Complicated viral uri  Loss of smell  Neg covid  In vaccinated individual  Counseled.  Sinus hygiene add antibiotic  As planned 5-7 days   Expectant management and discussion of plan and treatment with opportunity to ask questions and all were answered. The patient agreed with the plan and demonstrated an understanding of the instructions.   Advised to call back or seek an in-person evaluation if worsening  or having  further concerns . Return if symptoms worsen or fail to improve as expected.    Shanon Ace, MD

## 2020-05-29 ENCOUNTER — Ambulatory Visit (INDEPENDENT_AMBULATORY_CARE_PROVIDER_SITE_OTHER): Payer: Medicare Other | Admitting: Family Medicine

## 2020-05-29 ENCOUNTER — Other Ambulatory Visit: Payer: Self-pay

## 2020-05-29 ENCOUNTER — Encounter (INDEPENDENT_AMBULATORY_CARE_PROVIDER_SITE_OTHER): Payer: Self-pay | Admitting: Family Medicine

## 2020-05-29 VITALS — BP 115/75 | HR 69 | Temp 97.8°F | Ht 59.0 in | Wt 213.0 lb

## 2020-05-29 DIAGNOSIS — Z6841 Body Mass Index (BMI) 40.0 and over, adult: Secondary | ICD-10-CM | POA: Diagnosis not present

## 2020-05-29 DIAGNOSIS — E8881 Metabolic syndrome: Secondary | ICD-10-CM | POA: Diagnosis not present

## 2020-05-29 DIAGNOSIS — I10 Essential (primary) hypertension: Secondary | ICD-10-CM

## 2020-05-29 MED ORDER — TRULICITY 0.75 MG/0.5ML ~~LOC~~ SOAJ: 1.5000 mg | SUBCUTANEOUS | 0 refills | Status: DC

## 2020-05-30 ENCOUNTER — Encounter (INDEPENDENT_AMBULATORY_CARE_PROVIDER_SITE_OTHER): Payer: Self-pay | Admitting: Family Medicine

## 2020-05-30 ENCOUNTER — Ambulatory Visit (INDEPENDENT_AMBULATORY_CARE_PROVIDER_SITE_OTHER): Payer: Medicare Other | Admitting: Internal Medicine

## 2020-05-30 ENCOUNTER — Encounter: Payer: Self-pay | Admitting: Internal Medicine

## 2020-05-30 VITALS — BP 124/84 | HR 72 | Temp 98.0°F | Ht 59.0 in | Wt 216.6 lb

## 2020-05-30 DIAGNOSIS — I2584 Coronary atherosclerosis due to calcified coronary lesion: Secondary | ICD-10-CM | POA: Diagnosis not present

## 2020-05-30 DIAGNOSIS — I2510 Atherosclerotic heart disease of native coronary artery without angina pectoris: Secondary | ICD-10-CM

## 2020-05-30 DIAGNOSIS — M25551 Pain in right hip: Secondary | ICD-10-CM

## 2020-05-30 DIAGNOSIS — G8929 Other chronic pain: Secondary | ICD-10-CM

## 2020-05-30 DIAGNOSIS — M7918 Myalgia, other site: Secondary | ICD-10-CM

## 2020-05-30 NOTE — Progress Notes (Signed)
Chief Complaint  Patient presents with  . Leg Pain    right leg, almost hard to walk, has been going on for a while    HPI: Nichole Smith 64 y.o. come in for  Worsening  problem see above  Pain righ buttocks and radiating to hip and oacc to groin and down leg part way  After sitting a while and  Some time in groin  goin up stairs is difficult and hard to walk  Up an incline  pyriformis  Exercises didn't help .  She had done this years ago and  Restarted byt cthe stretching is pain ful and not  helping No falling   New injury .  To go on trip out [place] to [place] in [place]  In September and wants to be able to walk and reasonably hike without such pain .    ROS: See pertinent positives and negatives per HPI. Sinus infection is a lot better   Past Medical History:  Diagnosis Date  . Arthritis    knees, back  . Cancer ([place])   . Carcinoid tumor of lung 2012   right found incidentally on chest xray eval for r/o vasxculitis   . Carpal tunnel syndrome, bilateral   . Coronary artery calcification 07/23/2016  . Double vision    history, no current problem  . Dysrhythmia    irregular occassional, no current problem  . Frequent UTI   . HNP (herniated nucleus pulposus), cervical 01/22/2011   C6-C7  Has seen dr Vertell Limber for this and Dr Tonita Cong  Positional numbness in hands no weakness    . Hypertension   . Hypothyroidism   . Infertility, female   . 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 Obstructive sleep apnea 07/27/2014   NPSG 07/2014:  AHI 85/hr, optimal cpap 10cm.  Download 08/2014:  Good compliance, breakthru apnea on 10cm.  Changed to auto 5-12cm >> good control of AHI on f/u download ONO on CPAP:      . Osteoarthritis   . PCOS (polycystic ovarian syndrome)   . SUI (stress urinary incontinence, female)    h/o    Family History  Problem Relation Age of Onset  . Stroke Mother        died age 72  . Hypertension Mother   . Sudden death Mother   . Alcoholism Mother   .  Coronary artery disease Father        died age 59  . Hypertension Father   . Liver disease Father   . Alcohol abuse Father   . Obesity Father   . Parkinson's disease Brother   . Diabetes Brother   . Hypertension Brother   . Hypertension Maternal Uncle   . Colon cancer Neg Hx   . Rectal cancer Neg Hx   . Stomach cancer Neg Hx     Social History   Socioeconomic History  . Marital status: Married    Spouse name: [name]  . Number of children: 2  . Years of education: Not on file  . Highest education level: Not on file  Occupational History  . 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 Smoking status: Never Smoker  . Smokeless tobacco: Never Used  Vaping Use  . Vaping Use: Never used  Substance and Sexual Activity  . Alcohol use: Not Currently  . Drug use: No  . Sexual activity: Not Currently    Partners: Male    Birth control/protection:  Post-menopausal  Other Topics Concern  . Not on file  Social History Narrative   hhof 2    2 Children at college and  beyond      [name]    Married    Retired age 11 2015   Recently moved taking care of grandchildren during the week.   Social Determinants of Health   Financial Resource Strain:   . Difficulty of Paying Living Expenses:   Food Insecurity:   . Worried About Charity fundraiser in the Last Year:   . Arboriculturist in the Last Year:   Transportation Needs:   . Film/video editor (Medical):   [name] Lack of Transportation (Non-Medical):   Physical Activity:   . Days of Exercise per Week:   . Minutes of Exercise per Session:   Stress:   . Feeling of Stress :   Social Connections:   . Frequency of Communication with Friends and Family:   . Frequency of Social Gatherings with Friends and Family:   . Attends Religious Services:   . Active Member of Clubs or Organizations:   . Attends Archivist Meetings:   [name] Marital Status:     Outpatient Medications Prior  to Visit  Medication Sig Dispense Refill  . aspirin EC 81 MG tablet Take 81 mg by mouth at bedtime.    [name] atorvastatin (LIPITOR) 40 MG tablet Take 1 tablet (40 mg total) by mouth daily. 90 tablet 3  . betamethasone dipropionate (DIPROLENE) 0.05 % ointment Do not use more than 5 days in a row or more than 7 days each month. 45 g 1  . CALCIUM PO Take 1 tablet by mouth at bedtime.     [name] diltiazem (CARDIZEM CD) 180 MG 24 hr capsule Take 1 capsule (180 mg total) by mouth daily. 90 capsule 3  . Dulaglutide (TRULICITY) 9.48 NI/6.2VO SOPN Inject 1 mL (1.5 mg total) into the skin once a week. 12 pen 0  . estradiol (ESTRACE VAGINAL) 0.1 MG/GM vaginal cream INSERT 1 GM VAGINALLY TWICE WEEKLY 42.5 g 3  . hydrochlorothiazide (MICROZIDE) 12.5 MG capsule TAKE 1 CAPSULE DAILY.   SCHEDULE ANNUAL VISIT 90 capsule 3  . Multiple Vitamins-Minerals (PRESERVISION AREDS 2 PO) Take 1 tablet by mouth 2 (two) times daily.    . naproxen sodium (ALEVE) 220 MG tablet Take 220 mg by mouth 2 (two) times daily as needed (for pain.).    [name] SYNTHROID 137 MCG tablet TAKE 1 TABLET DAILY 90 tablet 3  . Vitamin D, Ergocalciferol, (DRISDOL) 1.25 MG (50000 UNIT) CAPS capsule Take 1 capsule (50,000 Units total) by mouth every 7 (seven) days. 12 capsule 0   No facility-administered medications prior to visit.     EXAM:  BP 124/84   Pulse 72   Temp 98 F (36.7 C) (Temporal)   Ht 4\' 11"  (1.499 m)   Wt 216 lb 9.6 oz (98.2 kg)   LMP 02/12/2011   SpO2 95%   BMI 43.75 kg/m   Body mass index is 43.75 kg/m.  GENERAL: vitals reviewed and listed above, alert, oriented, appears well hydrated and in no acute distress HEENT: atraumatic, conjunctiva  clear, no obvious abnormalities on inspection of external nose and ears OP : masked  NECK: no obvious masses on inspection palpation  CV: HRRR, no clubbing cyanosis or  peripheral edema nl cap refill  MS: moves all extremities without noticeable focal  Abnormality  Poss pain righ lower  back bupp lateral  hip an groin area  Mild limp toe heel normal    PSYCH: pleasant and cooperative, no obvious depression or anxiety  BP Readings from Last 3 Encounters:  05/30/20 124/84  05/29/20 115/75  05/23/20 140/75    ASSESSMENT AND PLAN:  Discussed the following assessment and plan:  Hip pain, chronic, right - Plan: DG HIP UNILAT WITH PELVIS MIN 4 VIEWS RIGHT, Ambulatory referral to Sports Medicine  Right buttock pain - Plan: DG HIP UNILAT WITH PELVIS MIN 4 VIEWS RIGHT, Ambulatory referral to Sports Medicine Pyriformis exercises hurt and seem to be worse   Check hip x ray and refer to dr Tamala Julian   Wants to be able to hike on her vacation planned in September .  Get hip x ray and refer back to Dr Tamala Julian ( she can call also)   -Patient advised to return or notify health care team  if  new concerns arise.  Patient Instructions  Get x ray  As discussed and will do referral to dr Tamala Julian whom you have seen before.    Piriformis Syndrome  Piriformis syndrome is a condition that can cause pain and numbness in your buttocks and down the back of your leg. Piriformis syndrome happens when the small muscle that connects the base of your spine to your hip (piriformis muscle) presses on the nerve that runs down the back of your leg (sciatic nerve). The piriformis muscle helps your hip rotate and helps to bring your leg back and out. It also helps shift your weight to keep you stable while you are walking. The sciatic nerve runs under or through the piriformis muscle. Damage to the piriformis muscle can cause spasms that put pressure on the nerve below. This causes pain and discomfort while sitting and moving. The pain may feel as if it begins in the buttock and spreads (radiates) down your hip and thigh. What are the causes? This condition is caused by pressure on the sciatic nerve from the piriformis muscle. The piriformis muscle can get irritated with overuse, especially if other hip muscles are  weak and the piriformis muscle has to do extra work. Piriformis syndrome can also occur after an injury, like a fall onto your buttocks. What increases the risk? You are more likely to develop this condition if you:  Are a woman.  Sit for long periods of time.  Are a cyclist.  Have weak buttocks muscles (gluteal muscles). What are the signs or symptoms? Symptoms of this condition include:  Pain, tingling, or numbness that starts in the buttock and runs down the back of your leg (sciatica).  Pain in the groin or thigh area. Your symptoms may get worse:  The longer you sit.  When you walk, run, or climb stairs.  When straining to have a bowel movement. How is this diagnosed? This condition is diagnosed based on your symptoms, medical history, and physical exam.  During the exam, your health care provider may: ? Move your leg into different positions to check for pain. ? Press on the muscles of your hip and buttock to see if that increases your symptoms.  You may also have tests, including: ? Imaging tests such as X-rays, MRI, or ultrasound. ? Electromyogram (EMG). This test measures electrical signals sent by your nerves into the muscles. ? Nerve conduction study. This test measures how well electrical signals pass through your nerves. How is this treated? This condition may be treated by:  Stopping all activities that cause pain or make  your condition worse.  Applying ice or using heat therapy.  Taking medicines to reduce pain and swelling.  Taking a muscle relaxer (muscle relaxant) to stop muscle spasms.  Doing range-of-motion and strengthening exercises (physical therapy) as told by your health care provider.  Massaging the area.  Having acupuncture.  Getting an injection of medicine in the piriformis muscle. Your health care provider will choose the medicine based on your condition. He or she may inject: ? An anti-inflammatory medicine (steroid) to reduce  swelling. ? A numbing medicine (local anesthetic) to block the pain. ? Botulinum toxin. The toxin blocks nerve impulses to specific muscles to reduce muscle tension. In rare cases, you may need surgery to cut the muscle and release pressure on the nerve if other treatments do not work. Follow these instructions at home: Activity  Do not sit for long periods. Get up and walk around every 20 minutes or as often as told by your health care provider. ? When driving long distances, make sure to take frequent stops to get up and stretch.  Use a cushion when you sit on hard surfaces.  Do exercises as told by your health care provider.  Return to your normal activities as told by your health care provider. Ask your health care provider what activities are safe for you. Managing pain, stiffness, and swelling      If directed, apply heat to the affected area as often as told by your health care provider. Use the heat source that your health care provider recommends, such as a moist heat pack or a heating pad. ? Place a towel between your skin and the heat source. ? Leave the heat on for 20-30 minutes. ? Remove the heat if your skin turns bright red. This is especially important if you are unable to feel pain, heat, or cold. You may have a greater risk of getting burned.  If directed, put ice on the injured area. ? Put ice in a plastic bag. ? Place a towel between your skin and the bag. ? Leave the ice on for 20 minutes, 2-3 times a day. General instructions  Take over-the-counter and prescription medicines only as told by your health care provider.  Ask your health care provider if the medicine prescribed to you requires you to avoid driving or using heavy machinery.  You may need to take actions to prevent or treat constipation, such as: ? Drink enough fluid to keep your urine pale yellow. ? Take over-the-counter or prescription medicines. ? Eat foods that are high in fiber, such as  beans, whole grains, and fresh fruits and vegetables. ? Limit foods that are high in fat and processed sugars, such as fried or sweet foods.  Keep all follow-up visits as told by your health care provider. This is important. How is this prevented?  Do not sit for longer than 20 minutes at a time. When you sit, choose padded surfaces.  Warm up and stretch before being active.  Cool down and stretch after being active.  Give your body time to rest between periods of activity.  Make sure to use equipment that fits you.  Maintain physical fitness, including: ? Strength. ? Flexibility. Contact a health care provider if:  Your pain and stiffness continue or get worse.  Your leg or hip becomes weak.  You have changes in your bowel function or bladder function. Summary  Piriformis syndrome is a condition that can cause pain, tingling, and numbness in your buttocks and down  the back of your leg.  You may try applying heat or ice to relieve the pain.  Do not sit for long periods. Get up and walk around every 20 minutes or as often as told by your health care provider. This information is not intended to replace advice given to you by your health care provider. Make sure you discuss any questions you have with your health care provider. Document Revised: 03/10/2019 Document Reviewed: 07/14/2018 Elsevier Patient Education  2020 [place] Kemauri Musa M.D.

## 2020-05-30 NOTE — Progress Notes (Signed)
Chief Complaint:   OBESITY Nichole Smith is here to discuss her progress with her obesity treatment plan along with follow-up of her obesity related diagnoses. Nichole Smith is on keeping a food journal and adhering to recommended goals of 1500-1600 calories and 100 grams of protein daily and states she is following her eating plan approximately 50% of the time. Nichole Smith states she is walking for 25-30 minutes 7 times per week.  Today's visit was #: 25 Starting weight: 238 lbs Starting date: 01/12/2019 Today's weight: 213 lbs Today's date: 05/29/2020 Total lbs lost to date: 25 Total lbs lost since last in-office visit: 3  Interim History: Nichole Smith has been ill with a sinus infection. She has met her protein goals. She is trying to be more consistent with the meal plan/journaling.  Subjective:   1. Insulin resistance Nichole Smith has a diagnosis of insulin resistance based on her elevated fasting insulin level >5. She was started on Trulicity at her last office visit. She feels this may be helping with polyphagia. She denies nausea or diarrhea. She continues to work on diet and exercise to decrease her risk of diabetes.  Lab Results  Component Value Date   INSULIN 13.5 01/02/2020   INSULIN 21.5 08/22/2019   INSULIN 20.4 01/12/2019   Lab Results  Component Value Date   HGBA1C 5.5 01/02/2020   2. Essential hypertension Nichole Smith's blood pressure is well controlled with Cardizem, metoprolol, and hydrochlorothiazide.   BP Readings from Last 3 Encounters:  05/29/20 115/75  05/23/20 140/75  04/23/20 125/76   Lab Results  Component Value Date   CREATININE 0.64 01/02/2020   CREATININE 0.68 08/22/2019   CREATININE 0.64 01/12/2019    Assessment/Plan:   1. Insulin resistance Nichole Smith will continue to work on weight loss, exercise, and decreasing simple carbohydrates to help decrease the risk of diabetes. Nichole Smith agreed to increase Trulicity 1.5 mg weekly. Nichole Smith agreed to follow-up with [name] as directed to closely  monitor her progress.  - Dulaglutide (TRULICITY) 2.19 XJ/8.8TG SOPN; Inject 1 mL (1.5 mg total) into the skin once a week.  Dispense: 12 pen; Refill: 0  2. Essential hypertension Nichole Smith will continue all of her medications, and will continue working on healthy weight loss and exercise to improve blood pressure control. We will watch for signs of hypotension as she continues her lifestyle modifications.  3. Class 3 severe obesity with serious comorbidity and body mass index (BMI) of 40.0 to 44.9 in adult, unspecified obesity type Nichole Smith) Nichole Smith is currently in the action stage of change. As such, her goal is to continue with weight loss efforts. She has agreed to keeping a food journal and adhering to recommended goals of 1500-1600 calories and 100 grams of protein daily.   Exercise goals: As is.  Behavioral modification strategies: decreasing simple carbohydrates.  Nichole Smith has agreed to follow-up with our clinic in 4 weeks. She was informed of the importance of frequent follow-up visits to maximize her success with intensive lifestyle modifications for her multiple health conditions.   Objective:   Blood pressure 115/75, pulse 69, temperature 97.8 F (36.6 C), temperature source Oral, height 4' 11"  (1.499 m), weight 213 lb (96.6 kg), last menstrual period 02/12/2011, SpO2 99 %. Body mass index is 43.02 kg/m.  General: Cooperative, alert, well developed, in no acute distress. HEENT: Conjunctivae and lids unremarkable. Cardiovascular: Regular rhythm.  Lungs: Normal work of breathing. Neurologic: No focal deficits.   Lab Results  Component Value Date   CREATININE 0.64 01/02/2020   BUN  21 01/02/2020   NA 142 01/02/2020   K 4.3 01/02/2020   CL 101 01/02/2020   CO2 27 01/02/2020   Lab Results  Component Value Date   ALT 25 01/02/2020   AST 23 01/02/2020   ALKPHOS 79 01/02/2020   BILITOT 0.4 01/02/2020   Lab Results  Component Value Date   HGBA1C 5.5 01/02/2020   HGBA1C 5.6  08/22/2019   HGBA1C 5.9 (H) 01/12/2019   HGBA1C 5.9 06/07/2018   Lab Results  Component Value Date   INSULIN 13.5 01/02/2020   INSULIN 21.5 08/22/2019   INSULIN 20.4 01/12/2019   Lab Results  Component Value Date   TSH 0.746 01/02/2020   Lab Results  Component Value Date   CHOL 117 01/02/2020   HDL 53 01/02/2020   LDLCALC 51 01/02/2020   TRIG 57 01/02/2020   CHOLHDL 2 06/07/2018   Lab Results  Component Value Date   WBC 7.8 01/12/2019   HGB 16.3 (H) 01/12/2019   HCT 47.8 (H) 01/12/2019   MCV 91 01/12/2019   PLT 228.0 06/07/2018   No results found for: IRON, TIBC, FERRITIN  Obesity Behavioral Intervention Documentation for Insurance:   Approximately 15 minutes were spent on the discussion below.  ASK: We discussed the diagnosis of obesity with Nichole Smith today and Nichole Smith agreed to give [name] permission to discuss obesity behavioral modification therapy today.  ASSESS: Nichole Smith has the diagnosis of obesity and her BMI today is 30. Nichole Smith is in the action stage of change.   ADVISE: Nichole Smith was educated on the multiple health risks of obesity as well as the benefit of weight loss to improve her health. She was advised of the need for long term treatment and the importance of lifestyle modifications to improve her current health and to decrease her risk of future health problems.  AGREE: Multiple dietary modification options and treatment options were discussed and Nichole Smith agreed to follow the recommendations documented in the above note.  ARRANGE: Nichole Smith was educated on the importance of frequent visits to treat obesity as outlined per CMS and USPSTF guidelines and agreed to schedule her next follow up appointment today.  Attestation Statements:   Reviewed by clinician on day of visit: allergies, medications, problem list, medical history, surgical history, family history, social history, and previous encounter notes.   [name], am acting as Location manager for [employer], FNP-C.  I have reviewed the above documentation for accuracy and completeness, and I agree with the above. -  [name], FNP

## 2020-05-30 NOTE — Patient Instructions (Signed)
Get x ray  As discussed and will do referral to dr Tamala Julian whom you have seen before.    Piriformis Syndrome  Piriformis syndrome is a condition that can cause pain and numbness in your buttocks and down the back of your leg. Piriformis syndrome happens when the small muscle that connects the base of your spine to your hip (piriformis muscle) presses on the nerve that runs down the back of your leg (sciatic nerve). The piriformis muscle helps your hip rotate and helps to bring your leg back and out. It also helps shift your weight to keep you stable while you are walking. The sciatic nerve runs under or through the piriformis muscle. Damage to the piriformis muscle can cause spasms that put pressure on the nerve below. This causes pain and discomfort while sitting and moving. The pain may feel as if it begins in the buttock and spreads (radiates) down your hip and thigh. What are the causes? This condition is caused by pressure on the sciatic nerve from the piriformis muscle. The piriformis muscle can get irritated with overuse, especially if other hip muscles are weak and the piriformis muscle has to do extra work. Piriformis syndrome can also occur after an injury, like a fall onto your buttocks. What increases the risk? You are more likely to develop this condition if you:  Are a woman.  Sit for long periods of time.  Are a cyclist.  Have weak buttocks muscles (gluteal muscles). What are the signs or symptoms? Symptoms of this condition include:  Pain, tingling, or numbness that starts in the buttock and runs down the back of your leg (sciatica).  Pain in the groin or thigh area. Your symptoms may get worse:  The longer you sit.  When you walk, run, or climb stairs.  When straining to have a bowel movement. How is this diagnosed? This condition is diagnosed based on your symptoms, medical history, and physical exam.  During the exam, your health care provider may: ? Move your  leg into different positions to check for pain. ? Press on the muscles of your hip and buttock to see if that increases your symptoms.  You may also have tests, including: ? Imaging tests such as X-rays, MRI, or ultrasound. ? Electromyogram (EMG). This test measures electrical signals sent by your nerves into the muscles. ? Nerve conduction study. This test measures how well electrical signals pass through your nerves. How is this treated? This condition may be treated by:  Stopping all activities that cause pain or make your condition worse.  Applying ice or using heat therapy.  Taking medicines to reduce pain and swelling.  Taking a muscle relaxer (muscle relaxant) to stop muscle spasms.  Doing range-of-motion and strengthening exercises (physical therapy) as told by your health care provider.  Massaging the area.  Having acupuncture.  Getting an injection of medicine in the piriformis muscle. Your health care provider will choose the medicine based on your condition. He or she may inject: ? An anti-inflammatory medicine (steroid) to reduce swelling. ? A numbing medicine (local anesthetic) to block the pain. ? Botulinum toxin. The toxin blocks nerve impulses to specific muscles to reduce muscle tension. In rare cases, you may need surgery to cut the muscle and release pressure on the nerve if other treatments do not work. Follow these instructions at home: Activity  Do not sit for long periods. Get up and walk around every 20 minutes or as often as told by your health  care provider. ? When driving long distances, make sure to take frequent stops to get up and stretch.  Use a cushion when you sit on hard surfaces.  Do exercises as told by your health care provider.  Return to your normal activities as told by your health care provider. Ask your health care provider what activities are safe for you. Managing pain, stiffness, and swelling      If directed, apply heat to  the affected area as often as told by your health care provider. Use the heat source that your health care provider recommends, such as a moist heat pack or a heating pad. ? Place a towel between your skin and the heat source. ? Leave the heat on for 20-30 minutes. ? Remove the heat if your skin turns bright red. This is especially important if you are unable to feel pain, heat, or cold. You may have a greater risk of getting burned.  If directed, put ice on the injured area. ? Put ice in a plastic bag. ? Place a towel between your skin and the bag. ? Leave the ice on for 20 minutes, 2-3 times a day. General instructions  Take over-the-counter and prescription medicines only as told by your health care provider.  Ask your health care provider if the medicine prescribed to you requires you to avoid driving or using heavy machinery.  You may need to take actions to prevent or treat constipation, such as: ? Drink enough fluid to keep your urine pale yellow. ? Take over-the-counter or prescription medicines. ? Eat foods that are high in fiber, such as beans, whole grains, and fresh fruits and vegetables. ? Limit foods that are high in fat and processed sugars, such as fried or sweet foods.  Keep all follow-up visits as told by your health care provider. This is important. How is this prevented?  Do not sit for longer than 20 minutes at a time. When you sit, choose padded surfaces.  Warm up and stretch before being active.  Cool down and stretch after being active.  Give your body time to rest between periods of activity.  Make sure to use equipment that fits you.  Maintain physical fitness, including: ? Strength. ? Flexibility. Contact a health care provider if:  Your pain and stiffness continue or get worse.  Your leg or hip becomes weak.  You have changes in your bowel function or bladder function. Summary  Piriformis syndrome is a condition that can cause pain, tingling,  and numbness in your buttocks and down the back of your leg.  You may try applying heat or ice to relieve the pain.  Do not sit for long periods. Get up and walk around every 20 minutes or as often as told by your health care provider. This information is not intended to replace advice given to you by your health care provider. Make sure you discuss any questions you have with your health care provider. Document Revised: 03/10/2019 Document Reviewed: 07/14/2018 Elsevier Patient Education  [place].

## 2020-05-31 ENCOUNTER — Encounter (INDEPENDENT_AMBULATORY_CARE_PROVIDER_SITE_OTHER): Payer: Self-pay | Admitting: Family Medicine

## 2020-05-31 MED ORDER — TRULICITY 1.5 MG/0.5ML ~~LOC~~ SOAJ
1.5000 mg | SUBCUTANEOUS | 0 refills | Status: DC
Start: 2020-05-31 — End: 2020-06-27

## 2020-05-31 NOTE — Addendum Note (Signed)
Addended by: [name] on: 05/31/2020 10:32 AM   Modules accepted: Orders

## 2020-06-01 ENCOUNTER — Other Ambulatory Visit: Payer: Self-pay | Admitting: Internal Medicine

## 2020-06-01 ENCOUNTER — Ambulatory Visit
Admission: RE | Admit: 2020-06-01 | Discharge: 2020-06-01 | Disposition: A | Discharge status: Home / Self Care | Payer: Medicare Other | Source: Ambulatory Visit | Attending: Internal Medicine | Admitting: Internal Medicine

## 2020-06-01 DIAGNOSIS — G8929 Other chronic pain: Secondary | ICD-10-CM

## 2020-06-01 DIAGNOSIS — M7918 Myalgia, other site: Secondary | ICD-10-CM

## 2020-06-01 DIAGNOSIS — M25551 Pain in right hip: Secondary | ICD-10-CM

## 2020-06-01 NOTE — Progress Notes (Signed)
X ray is normal this is reassuring  for no hip arthritis   Proceed with seeing  sports med

## 2020-06-06 ENCOUNTER — Other Ambulatory Visit: Payer: Self-pay

## 2020-06-06 ENCOUNTER — Ambulatory Visit (INDEPENDENT_AMBULATORY_CARE_PROVIDER_SITE_OTHER): Payer: Medicare Other | Admitting: Family Medicine

## 2020-06-06 VITALS — BP 122/84 | HR 78 | Ht 59.0 in | Wt 217.0 lb

## 2020-06-06 DIAGNOSIS — M7061 Trochanteric bursitis, right hip: Secondary | ICD-10-CM

## 2020-06-06 DIAGNOSIS — I2510 Atherosclerotic heart disease of native coronary artery without angina pectoris: Secondary | ICD-10-CM

## 2020-06-06 DIAGNOSIS — G5701 Lesion of sciatic nerve, right lower limb: Secondary | ICD-10-CM

## 2020-06-06 DIAGNOSIS — I2584 Coronary atherosclerosis due to calcified coronary lesion: Secondary | ICD-10-CM | POA: Diagnosis not present

## 2020-06-06 NOTE — Progress Notes (Signed)
    Subjective:    CC: R leg pain  I, Molly Weber, LAT, ATC, am serving as scribe for Dr. Lynne Leader.  HPI: Pt is a 64 y/o female presenting w/ c/o pain running from her R glute/hip and into her groin and R leg. Pain radiates from right glute into the hamstring and quad. Pain is constant with varying intensities. Sitting for prolonged periods increases her pain. Pain improves with movement. Did 13k steps yesterday and a water aerobics class. Has done some stretching but has not had any relief. Is using Aleve for pain.  Pain is mostly located in the lateral hip.  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   Objective:    Vitals:   06/06/20 0855  BP: 122/84  Pulse: 78  SpO2: 98%   General: Well Developed, well nourished, and in no acute distress.   MSK: Right hip normal-appearing normal motion.   Tender palpation greater trochanter. Some pain with crossover stretch in figure 4 stretch. Decrease strength hip abduction 4/5.  External rotation slightly decreased 4+/5.  Both produce pain. Antalgic gait.  Lab and Radiology Results EXAM: DG HIP (WITH OR WITHOUT PELVIS) 2-3V RIGHT  COMPARISON:  None.  FINDINGS: Both hips appear symmetric and normal. Normal joint space on the right. No osteophytes. No sign of avascular necrosis or other focal lesion. Other bones of the pelvis appear negative.  IMPRESSION: Negative radiographs.   Electronically Signed   By: Nelson Chimes M.D.   On: 06/01/2020 15:50 I, Lynne Leader, personally (independently) visualized and performed the interpretation of the images attached in  this note.    Impression and Recommendations:    Assessment and Plan: 64 y.o. female with right lateral hip and buttocks pain due to hip abductor tendinopathy/trochanteric bursitis.  May have a component of piriformis syndrome as well.  Plan for home exercise program teaching in clinic today by me as well as referral to physical therapy.  Check back in 6 weeks or so.  Patient notes that she has an important trip coming up in September where should be doing a lot of hiking and wants to be pain-free by then.  I think this is a reasonable goal.  If not better would consider injection.[name]  PDMP not reviewed this encounter. Orders Placed This Encounter  Procedures  . Ambulatory referral to Physical Therapy    Referral Priority:   Routine    Referral Type:   Physical Medicine    Referral Reason:   Specialty Services Required    Requested Specialty:   Physical Therapy    Number of Visits Requested:   1   No orders of the defined types were placed in this encounter.   Discussed warning signs or symptoms. Please see discharge instructions. Patient expresses understanding.   The above documentation has been reviewed and is accurate and complete Lynne Leader, M.D.

## 2020-06-06 NOTE — Patient Instructions (Signed)
Thank you for coming in today.  Plan for PT. Recheck in about 6 weeks. If needed can be seen sooner.  I expect that you will do very well with this.  Please let me know if you have any problems.

## 2020-06-07 ENCOUNTER — Encounter: Payer: Self-pay | Admitting: Family Medicine

## 2020-06-08 ENCOUNTER — Other Ambulatory Visit: Payer: Self-pay

## 2020-06-08 ENCOUNTER — Ambulatory Visit (INDEPENDENT_AMBULATORY_CARE_PROVIDER_SITE_OTHER): Payer: Medicare Other | Admitting: Obstetrics & Gynecology

## 2020-06-08 ENCOUNTER — Telehealth: Payer: Self-pay

## 2020-06-08 ENCOUNTER — Encounter: Payer: Self-pay | Admitting: Obstetrics & Gynecology

## 2020-06-08 VITALS — BP 120/70 | HR 70 | Temp 97.7°F | Resp 16 | Wt 215.0 lb

## 2020-06-08 DIAGNOSIS — I2584 Coronary atherosclerosis due to calcified coronary lesion: Secondary | ICD-10-CM | POA: Diagnosis not present

## 2020-06-08 DIAGNOSIS — N390 Urinary tract infection, site not specified: Secondary | ICD-10-CM | POA: Diagnosis not present

## 2020-06-08 DIAGNOSIS — R300 Dysuria: Secondary | ICD-10-CM | POA: Diagnosis not present

## 2020-06-08 DIAGNOSIS — I2510 Atherosclerotic heart disease of native coronary artery without angina pectoris: Secondary | ICD-10-CM | POA: Diagnosis not present

## 2020-06-08 LAB — POCT URINALYSIS DIPSTICK
Bilirubin, UA: NEGATIVE
Blood, UA: NEGATIVE
Glucose, UA: NEGATIVE
Ketones, UA: NEGATIVE
Nitrite, UA: NEGATIVE
Protein, UA: NEGATIVE
Urobilinogen, UA: NEGATIVE E.U./dL — AB
pH, UA: 5 (ref 5.0–8.0)

## 2020-06-08 MED ORDER — SULFAMETHOXAZOLE-TRIMETHOPRIM 800-160 MG PO TABS: 1.0000 | ORAL_TABLET | Freq: Two times a day (BID) | ORAL | 1 refills | Status: DC

## 2020-06-08 NOTE — Telephone Encounter (Signed)
AEX 05/2019 H/O UTIs , last +, 07/2018  Spoke with pt. Pt reports having UTI sx since yesterday afternoon of urinary urgency, frequency, and bladder spasms. Pt denies fever, chills, back pain or vaginal discharge, odor or burning with urination. Pt states thinks sx appeared due to decreased H2O and increased of sodas lately. Pt states last UTI was 2 years ago in 2019. Pt states did take AZO x 1 yesterday. Advised not to take more due to urinary testing at appt. Pt agreeable.  Pt advised to have OV for further evaluation. Pt agreeable. Pt scheduled with Dr Sabra Heck for [place] today 7/9 at 330 pm. Pt verbalized understanding and is agreeable.  CPS neg.   Routing to Dr Sabra Heck.  Encounter closed.

## 2020-06-08 NOTE — Telephone Encounter (Signed)
Patient is calling regarding needing appointment for UTI. Need triage to assist.

## 2020-06-08 NOTE — Progress Notes (Signed)
GYNECOLOGY  VISIT  CC:   Painful urination  HPI: 64 y.o. G2P2 Married White or Caucasian female here for dysuria that started yesterday.  She is having increased urinary urgency, urinary frequency and bladder spasms.  She took AZO and this didn't do anything for the urinary urgency.  Has not seen any blood in her urine.    poct urine-wbc tr  GYNECOLOGIC HISTORY: Patient's last menstrual period was 02/12/2011. Contraception: post menopausal Menopausal hormone therapy: estrace cream  Patient Active Problem List   Diagnosis Date Noted  . Insulin resistance 03/05/2020  . Vitamin D deficiency 08/25/2019  . Prediabetes 08/25/2019  . Acute pain of right knee 09/28/2018  . Lichen planus 21/30/8657  . Cervical stenosis of spinal canal 12/24/2017  . Hyperlipidemia LDL goal <70 08/26/2017  . Heart palpitations 08/26/2017  . Hammertoe of right foot 01/06/2017  . Coronary artery calcification 07/23/2016  . Periodic limb movements of sleep 06/18/2016  . Atherosclerotic plaque 06/11/2016  . Piriformis syndrome of left side 10/24/2015  . Loss of transverse plantar arch of left foot 09/12/2015  . 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 Obstructive sleep apnea 07/27/2014  . Midline low back pain without sciatica 06/20/2014  . Polycythemia, secondary 03/22/2014  . Severe obesity (BMI >= 40) ([place]) 02/22/2014  . Elevated hemoglobin ([place]) 02/22/2014  . Left shoulder pain 02/04/2013  . 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 HNP (herniated nucleus pulposus), cervical 01/22/2011  . Visit for preventive health examination 01/22/2011  . PAIN IN JOINT, MULTIPLE SITES 01/21/2010  . Hypothyroidism 10/16/2008  . Class 3 severe obesity with serious comorbidity and body mass index (BMI) of 40.0 to 44.9 in adult ([place]) 01/07/2008  . PALPITATIONS, RECURRENT 01/07/2008  . Essential hypertension 06/25/2007  . OSTEOARTHRITIS 06/25/2007    Past Medical History:  Diagnosis Date  . Arthritis    knees, back  .  Cancer ([place])   . Carcinoid tumor of lung 2012   right found incidentally on chest xray eval for r/o vasxculitis   . Carpal tunnel syndrome, bilateral   . Coronary artery calcification 07/23/2016  . Double vision    history, no current problem  . Dysrhythmia    irregular occassional, no current problem  . Frequent UTI   . HNP (herniated nucleus pulposus), cervical 01/22/2011   C6-C7  Has seen dr Vertell Limber for this and Dr Tonita Cong  Positional numbness in hands no weakness    . Hypertension   . Hypothyroidism   . Infertility, female   . 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 Obstructive sleep apnea 07/27/2014   NPSG 07/2014:  AHI 85/hr, optimal cpap 10cm.  Download 08/2014:  Good compliance, breakthru apnea on 10cm.  Changed to auto 5-12cm >> good control of AHI on f/u download ONO on CPAP:      . Osteoarthritis   . PCOS (polycystic ovarian syndrome)   . SUI (stress urinary incontinence, female)    h/o    Past Surgical History:  Procedure Laterality Date  . ANTERIOR CERVICAL DECOMP/DISCECTOMY FUSION  12/24/2017   Procedure: Cervical five-six Cervical six-seven Anterior cervical decompression/discectomy/fusion;  Surgeon: Erline Levine, MD;  Location: [facility];  Service: Neurosurgery;;  . Wilmon Pali RELEASE Left 12/24/2017   Procedure: LEFT CARPAL TUNNEL RELEASE;  Surgeon: Erline Levine, MD;  Location: [facility];  Service: Neurosurgery;  Laterality: Left;  . CESAREAN SECTION     x 1 - twins  . COLONOSCOPY    . DILATATION & CURRETTAGE/HYSTEROSCOPY WITH RESECTOCOPE N/A 07/18/2013   Procedure:  [place];  Surgeon: Lyman Speller, MD;  Location: [facility];  Service: Gynecology;  Laterality: N/A;  mass resection  . Lung tumor removed     Rt lung carcinoid  . MOUTH SURGERY     pre-cancerous ulcer removed  . SHOULDER OPEN ROTATOR CUFF REPAIR  07/20/2010   Right  . WISDOM TOOTH EXTRACTION      MEDS:   Current Outpatient Medications on File Prior to  Visit  Medication Sig Dispense Refill  . aspirin EC 81 MG tablet Take 81 mg by mouth at bedtime.    [name] atorvastatin (LIPITOR) 40 MG tablet Take 1 tablet (40 mg total) by mouth daily. 90 tablet 3  . betamethasone dipropionate (DIPROLENE) 0.05 % ointment Do not use more than 5 days in a row or more than 7 days each month. 45 g 1  . CALCIUM PO Take 1 tablet by mouth at bedtime.     [name] diltiazem (CARDIZEM CD) 180 MG 24 hr capsule Take 1 capsule (180 mg total) by mouth daily. 90 capsule 3  . Dulaglutide (TRULICITY) 1.5 UT/6.5YY SOPN Inject 0.5 mLs (1.5 mg total) into the skin once a week. 4 pen 0  . estradiol (ESTRACE VAGINAL) 0.1 MG/GM vaginal cream INSERT 1 GM VAGINALLY TWICE WEEKLY 42.5 g 3  . hydrochlorothiazide (MICROZIDE) 12.5 MG capsule TAKE 1 CAPSULE DAILY.   SCHEDULE ANNUAL VISIT 90 capsule 3  . metoprolol succinate (TOPROL-XL) 50 MG 24 hr tablet Take 50 mg by mouth daily. Take with or immediately following a meal.    . Multiple Vitamins-Minerals (PRESERVISION AREDS 2 PO) Take 1 tablet by mouth 2 (two) times daily.    . naproxen sodium (ALEVE) 220 MG tablet Take 220 mg by mouth 2 (two) times daily as needed (for pain.).    [name] SYNTHROID 137 MCG tablet TAKE 1 TABLET DAILY 90 tablet 3  . Vitamin D, Ergocalciferol, (DRISDOL) 1.25 MG (50000 UNIT) CAPS capsule Take 1 capsule (50,000 Units total) by mouth every 7 (seven) days. 12 capsule 0   No current facility-administered medications on file prior to visit.    ALLERGIES: Neomycin and Ace inhibitors  Family History  Problem Relation Age of Onset  . Stroke Mother        died age 20  . Hypertension Mother   . Sudden death Mother   . Alcoholism Mother   . Coronary artery disease Father        died age 23  . Hypertension Father   . Liver disease Father   . Alcohol abuse Father   . Obesity Father   . Parkinson's disease Brother   . Diabetes Brother   . Hypertension Brother   . Hypertension Maternal Uncle   . Colon cancer Neg Hx   .  Rectal cancer Neg Hx   . Stomach cancer Neg Hx     SH:  Married, non smoker  Review of Systems  Constitutional: Negative.   HENT: Negative.   Eyes: Negative.   Respiratory: Negative.   Cardiovascular: Negative.   Gastrointestinal: Negative.   Endocrine: Negative.   Genitourinary: Positive for dysuria and frequency.  Musculoskeletal: Negative.   Skin: Negative.   Allergic/Immunologic: Negative.   Neurological: Negative.   Hematological: Negative.   Psychiatric/Behavioral: Negative.     PHYSICAL EXAMINATION:    BP 120/70   Pulse 70   Resp 16   Wt 215 lb (97.5 kg)   LMP 02/12/2011   BMI 43.42 kg/m  General appearance: alert, cooperative and appears stated age Flank:  No CVA tenderness normal; no masses,  no organomegaly Lymph:  no inguinal LAD noted  Pelvic: External genitalia:  no lesions              Urethra:  normal appearing urethra with no masses, tenderness or lesions              Bartholins and Skenes: normal                 Vagina: normal appearing vagina with normal color and discharge, no lesions              Cervix: no lesions              Bimanual Exam:  Uterus:  normal size, contour, position, consistency, mobility, non-tender              Adnexa: no mass, fullness, tenderness  Chaperone, Royal Hawthorn, CMA, was present for exam.  Assessment: Probable cystitis with dysuria, urgency and frequency  Plan: Urine micro and culture pending Bactrim DS BID x 3 days to pharmacy.  Results will be called or sent via mychart to pt

## 2020-06-11 LAB — URINE CULTURE

## 2020-06-12 MED ORDER — METHOCARBAMOL 500 MG PO TABS: 500.0000 mg | ORAL_TABLET | Freq: Three times a day (TID) | ORAL | 0 refills | Status: DC

## 2020-06-12 NOTE — Addendum Note (Signed)
Addended by: Gregor Hams on: 06/12/2020 12:29 PM   Modules accepted: Orders

## 2020-06-20 NOTE — Telephone Encounter (Signed)
I dont know   Either  This is not a medical ?s  It is possible that sprots medicine referrered her   Please send this to administrative stafff that can help

## 2020-06-23 ENCOUNTER — Other Ambulatory Visit (INDEPENDENT_AMBULATORY_CARE_PROVIDER_SITE_OTHER): Payer: Self-pay | Admitting: Family Medicine

## 2020-06-27 ENCOUNTER — Other Ambulatory Visit: Payer: Self-pay

## 2020-06-27 ENCOUNTER — Ambulatory Visit (INDEPENDENT_AMBULATORY_CARE_PROVIDER_SITE_OTHER): Payer: Medicare Other | Admitting: Family Medicine

## 2020-06-27 ENCOUNTER — Encounter (INDEPENDENT_AMBULATORY_CARE_PROVIDER_SITE_OTHER): Payer: Self-pay | Admitting: Family Medicine

## 2020-06-27 VITALS — BP 130/82 | HR 61 | Temp 97.9°F | Ht 59.0 in | Wt 212.0 lb

## 2020-06-27 DIAGNOSIS — Z6841 Body Mass Index (BMI) 40.0 and over, adult: Secondary | ICD-10-CM

## 2020-06-27 DIAGNOSIS — R7303 Prediabetes: Secondary | ICD-10-CM

## 2020-06-27 DIAGNOSIS — D582 Other hemoglobinopathies: Secondary | ICD-10-CM | POA: Diagnosis not present

## 2020-06-27 DIAGNOSIS — E559 Vitamin D deficiency, unspecified: Secondary | ICD-10-CM

## 2020-06-27 DIAGNOSIS — E038 Other specified hypothyroidism: Secondary | ICD-10-CM | POA: Diagnosis not present

## 2020-06-27 MED ORDER — TRULICITY 1.5 MG/0.5ML ~~LOC~~ SOAJ: 1.5000 mg | SUBCUTANEOUS | 1 refills | Status: DC

## 2020-06-27 NOTE — Progress Notes (Signed)
Chief Complaint:   OBESITY Nichole Smith is here to discuss her progress with her obesity treatment plan along with follow-up of her obesity related diagnoses. Nichole Smith is on keeping a food journal and adhering to recommended goals of 1500-1600 calories and 100 grams of protein daily and states she is following her eating plan approximately 80% of the time. Nichole Smith states she is walking and doing water aerobics for 14 minutes 7 times per week.  Today's visit was #: 53 Starting weight: 238 lbs Starting date: 01/12/2019 Today's weight: 212 lbs Today's date: 06/27/2020 Total lbs lost to date: 26 Total lbs lost since last in-office visit: 1  Interim History: Nichole Smith notes doing well most days but she has had a few days of being up to 2,500 on calories due to social functions. She journals very consistently. She is having back pain that is preventing exercise.  Subjective:   1. Pre-diabetes Nichole Smith has a diagnosis of pre-diabetes based on her elevated Hgb A1c and was informed this puts her at greater risk of developing diabetes. Nichole Smith's Trulicity increased to 1.5 mg weekly. She feels it is starting to helping with hunger. She has some constipation. She continues to work on diet and exercise to decrease her risk of diabetes. She denies nausea. A1c was 5.9 back in Feb 2020.  Lab Results  Component Value Date   HGBA1C 5.5 01/02/2020   Lab Results  Component Value Date   INSULIN 13.5 01/02/2020   INSULIN 21.5 08/22/2019   INSULIN 20.4 01/12/2019   2. Vitamin D deficiency Nichole Smith is on prescription Vit D, and last Vit D level was at goal.  3. Elevated hemoglobin (Nichole Smith) Nichole Smith has had chemically high hemoglobin. Last hemoglobin was 16.3. She has seen Hematology in the past but she has needed treatment.  4. Other specified hypothyroidism Nichole Smith is on Synthroid 137 mcg. She feels stable on 137 mcg. She denies palpitations or heat/cold intolerance.   Lab Results  Component Value Date   TSH 0.746  01/02/2020   Assessment/Plan:   1. Pre-diabetes Nichole Smith will continue to work on weight loss, exercise, and decreasing simple carbohydrates to help decrease the risk of diabetes. We will check labs today, and we will refill Trulicity for 1 month.  - Dulaglutide (TRULICITY) 1.5 IR/5.1OA SOPN; Inject 0.5 mLs (1.5 mg total) into the skin once a week.  Dispense: 4 pen; Refill: 0 - Comprehensive metabolic panel - Hemoglobin A1c - Insulin, random   2. Vitamin D deficiency Low Vitamin D level contributes to fatigue and are associated with obesity, breast, and colon cancer. Takari agreed to discontinue prescription Vitamin D, and start OTC Vit D 2,000 IU daily. She will follow-up for routine testing of Vitamin D, at least 2-3 times per year to avoid over-replacement. We will check labs today.  - VITAMIN D 25 Hydroxy (Vit-D Deficiency, Fractures)  3. Elevated hemoglobin ([facility]) We will check labs today, and Nichole Smith will follow up as directed.  - Comprehensive metabolic panel - CBC with Differential/Platelet  4. Other specified hypothyroidism Patient with long-standing hypothyroidism, Nichole Smith will continue levothyroxine 137 mcg. She appears euthyroid. We will check labs today. Orders and follow up as documented in patient record.  - T3 - T4, free - TSH  5. Class 3 severe obesity with serious comorbidity and body mass index (BMI) of 40.0 to 44.9 in adult, unspecified obesity type Nichole Smith) Nichole Smith is currently in the action stage of change. As such, her goal is to continue with weight loss efforts. She  has agreed to keeping a food journal and adhering to recommended goals of 1500-1600 calories and 100 grams of protein daily.   Exercise goals: As is.  Behavioral modification strategies: decreasing simple carbohydrates.  Nichole Smith has agreed to follow-up with our clinic in 3 weeks.   Nichole Smith was informed we would discuss her lab results at her next visit unless there is a critical issue that needs to be  addressed sooner. Nichole Smith agreed to keep her next visit at the agreed upon time to discuss these results.  Objective:   Blood pressure (!) 130/82, pulse 61, temperature 97.9 F (36.6 C), temperature source Oral, height 4\' 11"  (1.499 m), weight (!) 212 lb (96.2 kg), last menstrual period 02/12/2011, SpO2 96 %. Body mass index is 42.82 kg/m.  General: Cooperative, alert, well developed, in no acute distress. HEENT: Conjunctivae and lids unremarkable. Cardiovascular: Regular rhythm.  Lungs: Normal work of breathing. Neurologic: No focal deficits.   Lab Results  Component Value Date   CREATININE 0.64 01/02/2020   BUN 21 01/02/2020   NA 142 01/02/2020   K 4.3 01/02/2020   CL 101 01/02/2020   CO2 27 01/02/2020   Lab Results  Component Value Date   ALT 25 01/02/2020   AST 23 01/02/2020   ALKPHOS 79 01/02/2020   BILITOT 0.4 01/02/2020   Lab Results  Component Value Date   HGBA1C 5.5 01/02/2020   HGBA1C 5.6 08/22/2019   HGBA1C 5.9 (H) 01/12/2019   HGBA1C 5.9 06/07/2018   Lab Results  Component Value Date   INSULIN 13.5 01/02/2020   INSULIN 21.5 08/22/2019   INSULIN 20.4 01/12/2019   Lab Results  Component Value Date   TSH 0.746 01/02/2020   Lab Results  Component Value Date   CHOL 117 01/02/2020   HDL 53 01/02/2020   LDLCALC 51 01/02/2020   TRIG 57 01/02/2020   CHOLHDL 2 06/07/2018   Lab Results  Component Value Date   WBC 7.8 01/12/2019   HGB 16.3 (H) 01/12/2019   HCT 47.8 (H) 01/12/2019   MCV 91 01/12/2019   PLT 228.0 06/07/2018   No results found for: IRON, TIBC, FERRITIN  Obesity Behavioral Intervention Documentation for Insurance:   Approximately 15 minutes were spent on the discussion below.  ASK: We discussed the diagnosis of obesity with Nichole Smith today and Nichole Smith agreed to give [name] permission to discuss obesity behavioral modification therapy today.  ASSESS: Nichole Smith has the diagnosis of obesity and her BMI today is 42.8. Nichole Smith is in the action stage of  change.   ADVISE: Nichole Smith was educated on the multiple health risks of obesity as well as the benefit of weight loss to improve her health. She was advised of the need for long term treatment and the importance of lifestyle modifications to improve her current health and to decrease her risk of future health problems.  AGREE: Multiple dietary modification options and treatment options were discussed and Nichole Smith agreed to follow the recommendations documented in the above note.  ARRANGE: Nichole Smith was educated on the importance of frequent visits to treat obesity as outlined per CMS and USPSTF guidelines and agreed to schedule her next follow up appointment today.  Attestation Statements:   Reviewed by clinician on day of visit: allergies, medications, problem list, medical history, surgical history, family history, social history, and previous encounter notes.   [name], am acting as Location manager for [employer], FNP-C.  I have reviewed the above documentation for accuracy and completeness, and I agree with the  above. -  [name], FNP

## 2020-06-28 LAB — COMPREHENSIVE METABOLIC PANEL
ALT: 28 IU/L (ref 0–32)
AST: 27 IU/L (ref 0–40)
Albumin/Globulin Ratio: 1.5 (ref 1.2–2.2)
Albumin: 4.1 g/dL (ref 3.8–4.8)
Alkaline Phosphatase: 74 IU/L (ref 48–121)
BUN/Creatinine Ratio: 34 — ABNORMAL HIGH (ref 12–28)
BUN: 21 mg/dL (ref 8–27)
Bilirubin Total: 0.4 mg/dL (ref 0.0–1.2)
CO2: 26 mmol/L (ref 20–29)
Calcium: 9.3 mg/dL (ref 8.7–10.3)
Chloride: 101 mmol/L (ref 96–106)
Creatinine, Ser: 0.62 mg/dL (ref 0.57–1.00)
GFR calc Af Amer: 109 mL/min/{1.73_m2} (ref >59)
GFR calc non Af Amer: 95 mL/min/{1.73_m2} (ref >59)
Globulin, Total: 2.7 g/dL (ref 1.5–4.5)
Glucose: 82 mg/dL (ref 65–99)
Potassium: 4.5 mmol/L (ref 3.5–5.2)
Sodium: 140 mmol/L (ref 134–144)
Total Protein: 6.8 g/dL (ref 6.0–8.5)

## 2020-06-28 LAB — CBC WITH DIFFERENTIAL/PLATELET
Basophils Absolute: 0 10*3/uL (ref 0.0–0.2)
Basos: 0 %
EOS (ABSOLUTE): 0.2 10*3/uL (ref 0.0–0.4)
Eos: 3 %
Hematocrit: 47.9 % — ABNORMAL HIGH (ref 34.0–46.6)
Hemoglobin: 15.9 g/dL (ref 11.1–15.9)
Immature Grans (Abs): 0 10*3/uL (ref 0.0–0.1)
Immature Granulocytes: 0 %
Lymphocytes Absolute: 2 10*3/uL (ref 0.7–3.1)
Lymphs: 25 %
MCH: 31.2 pg (ref 26.6–33.0)
MCHC: 33.2 g/dL (ref 31.5–35.7)
MCV: 94 fL (ref 79–97)
Monocytes Absolute: 0.6 10*3/uL (ref 0.1–0.9)
Monocytes: 7 %
Neutrophils Absolute: 5.3 10*3/uL (ref 1.4–7.0)
Neutrophils: 65 %
Platelets: 243 10*3/uL (ref 150–450)
RBC: 5.09 x10E6/uL (ref 3.77–5.28)
RDW: 12.1 % (ref 11.7–15.4)
WBC: 8.1 10*3/uL (ref 3.4–10.8)

## 2020-06-28 LAB — HEMOGLOBIN A1C
Est. average glucose Bld gHb Est-mCnc: 108 mg/dL
Hgb A1c MFr Bld: 5.4 % (ref 4.8–5.6)

## 2020-06-28 LAB — T3: T3, Total: 114 ng/dL (ref 71–180)

## 2020-06-28 LAB — TSH: TSH: 4.26 u[IU]/mL (ref 0.450–4.500)

## 2020-06-28 LAB — VITAMIN D 25 HYDROXY (VIT D DEFICIENCY, FRACTURES): Vit D, 25-Hydroxy: 51.9 ng/mL (ref 30.0–100.0)

## 2020-06-28 LAB — INSULIN, RANDOM: INSULIN: 13.7 u[IU]/mL (ref 2.6–24.9)

## 2020-06-28 LAB — T4, FREE: Free T4: 1.45 ng/dL (ref 0.82–1.77)

## 2020-07-19 ENCOUNTER — Ambulatory Visit (INDEPENDENT_AMBULATORY_CARE_PROVIDER_SITE_OTHER): Payer: Medicare Other | Admitting: Family Medicine

## 2020-07-19 ENCOUNTER — Encounter (INDEPENDENT_AMBULATORY_CARE_PROVIDER_SITE_OTHER): Payer: Self-pay | Admitting: Family Medicine

## 2020-07-19 ENCOUNTER — Other Ambulatory Visit: Payer: Self-pay

## 2020-07-19 VITALS — BP 121/77 | HR 60 | Temp 97.6°F | Ht 59.0 in | Wt 211.0 lb

## 2020-07-19 DIAGNOSIS — E559 Vitamin D deficiency, unspecified: Secondary | ICD-10-CM | POA: Diagnosis not present

## 2020-07-19 DIAGNOSIS — Z6841 Body Mass Index (BMI) 40.0 and over, adult: Secondary | ICD-10-CM | POA: Diagnosis not present

## 2020-07-19 DIAGNOSIS — E038 Other specified hypothyroidism: Secondary | ICD-10-CM | POA: Diagnosis not present

## 2020-07-19 NOTE — Progress Notes (Addendum)
Chief Complaint:   OBESITY Nichole Smith is here to discuss her progress with her obesity treatment plan along with follow-up of her obesity related diagnoses. Nichole Smith is on keeping a food journal and adhering to recommended goals of 1500-1600 calories and 100 grams of protein daily and states she is following her eating plan approximately 80% of the time. Nichole Smith states she is doing weights, physical therapy, and swimming for 20 minutes 5 times per week.  Today's visit was #: 27 Starting weight: 238 lbs Starting date: 01/12/2019 Today's weight: 211 lbs Today's date: 07/19/2020 Total lbs lost to date: 27 Total lbs lost since last in-office visit: 1  Interim History: Nichole Smith is journaling consistently and in detail. She is averaging between 1500-1700 calories per day and meets her protein goals. She is quite frustrated with very slow weight loss despite good adherence to plan.  She has been unable to walk or exercise as much due to right hip pain.  Subjective:   1. Vitamin D deficiency Nichole Smith is on OTC Vit D 2,000 IU daily and calcium with D. Last Vit D level was 51.9.  2. Other specified hypothyroidism Nichole Smith notes hair loss recently and increased constipation. Her last TSH has trended up to 4.260, which is high for her. Nichole Smith is currently taking 137 mch of levothyroxine daily. TSH Order: [id number] Status:  Final result Visible to patient:  Yes (seen) Next appt:  07/26/2020 at 08:30 AM in Gynecology Nichole Salon, Nichole Smith) Dx:  Other specified hypothyroidism  0 Result Notes  Ref Range & Units 3 wk ago 6 mo ago 11 mo ago 1 yr ago  TSH 0.450 - 4.500 uIU/mL 4.260  0.746  1.220  2.490         Assessment/Plan:   1. Vitamin D deficiency Low Vitamin D level contributes to fatigue and are associated with obesity, breast, and colon cancer. Nichole Smith to increase OTC Vit D to 4,000 IU daily and will follow-up for routine testing of Vitamin D, at least 2-3 times per year to avoid over-replacement.  2.  Other specified hypothyroidism We will continue to monitor. Nichole Smith, and she will continue levothyroxine 137 mcg daily.   3. Class 3 severe obesity with serious comorbidity and body mass index (BMI) of 40.0 to 44.9 in adult, unspecified obesity type Nichole Smith) Nichole Smith is currently in the action stage of change. As such, her goal is to continue with weight loss efforts. She has Smith to keeping a food journal and adhering to recommended goals of 1300-1500 calories and 85-90 grams of protein daily.   Exercise goals: Increase exercise if possible.  Behavioral modification strategies: increasing lean protein intake, decreasing simple carbohydrates and keeping a strict food journal. Given slow weight loss, I will reduce calorie and protein parameters to 1300-1500 cal/day and 85-90 grams protein.   Nichole Smith has Smith to follow-up with our clinic in 4 weeks. She was informed of the importance of frequent follow-up visits to maximize her success with intensive lifestyle modifications for her multiple health conditions.   Objective:   Blood pressure 121/77, pulse 60, temperature 97.6 F (36.4 C), temperature source Oral, height 4\' 11"  (1.499 m), weight 211 lb (95.7 kg), last menstrual period 02/12/2011, SpO2 98 %. Body mass index is 42.62 kg/m.  General: Cooperative, alert, well developed, in no acute distress. HEENT: Conjunctivae and lids unremarkable. Cardiovascular: Regular rhythm.  Lungs: Normal work of breathing. Neurologic: No focal deficits.   Lab Results  Component Value Date   CREATININE 0.62 06/27/2020   BUN 21 06/27/2020   NA 140 06/27/2020   K 4.5 06/27/2020   CL 101 06/27/2020   CO2 26 06/27/2020   Lab Results  Component Value Date   ALT 28 06/27/2020   AST 27 06/27/2020   ALKPHOS 74 06/27/2020   BILITOT 0.4 06/27/2020   Lab Results  Component Value Date   HGBA1C 5.4 06/27/2020   HGBA1C 5.5 01/02/2020   HGBA1C 5.6 08/22/2019    HGBA1C 5.9 (H) 01/12/2019   HGBA1C 5.9 06/07/2018   Lab Results  Component Value Date   INSULIN 13.7 06/27/2020   INSULIN 13.5 01/02/2020   INSULIN 21.5 08/22/2019   INSULIN 20.4 01/12/2019   Lab Results  Component Value Date   TSH 4.260 06/27/2020   Lab Results  Component Value Date   CHOL 117 01/02/2020   HDL 53 01/02/2020   LDLCALC 51 01/02/2020   TRIG 57 01/02/2020   CHOLHDL 2 06/07/2018   Lab Results  Component Value Date   WBC 8.1 06/27/2020   HGB 15.9 06/27/2020   HCT 47.9 (H) 06/27/2020   MCV 94 06/27/2020   PLT 243 06/27/2020   No results found for: IRON, TIBC, FERRITIN  Obesity Behavioral Intervention Documentation for Insurance:   Approximately 15 minutes were spent on the discussion below.  ASK: We discussed the diagnosis of obesity with Nichole Smith today and Nichole Smith Smith to give [name] permission to discuss obesity behavioral modification therapy today.  ASSESS: Nichole Smith has the diagnosis of obesity and her BMI today is 42.59. Nichole Smith is in the action stage of change.   ADVISE: Nichole Smith was educated on the multiple health risks of obesity as well as the benefit of weight loss to improve her health. She was advised of the need for long term treatment and the importance of lifestyle modifications to improve her current health and to decrease her risk of future health problems.  AGREE: Multiple dietary modification options and treatment options were discussed and Nichole Smith Smith to follow the recommendations documented in the above note.  ARRANGE: Nichole Smith was educated on the importance of frequent visits to treat obesity as outlined per CMS and USPSTF guidelines and Smith to schedule her next follow up appointment today.  Attestation Statements:   Reviewed by clinician on day of visit: allergies, medications, problem list, medical history, surgical history, family history, social history, and previous encounter notes.   [name], am acting as Location manager for  [employer], FNP-C.  I have reviewed the above documentation for accuracy and completeness, and I agree with the above. -  [name], FNP

## 2020-07-23 NOTE — Progress Notes (Deleted)
64 y.o. G2P2 Married White or Caucasian female here for annual exam.    Patient's last menstrual period was 02/12/2011.          Sexually active: {yes no:314532}  The current method of family planning is post menopausal status.    Exercising: {yes no:314532}  {types:19826} Smoker:  {YES NO:22349}  Health Maintenance: Pap:  03-09-17 neg HPV HR neg History of abnormal Pap:  no MMG:  08-15-2019 category a density birads 1:neg Colonoscopy:  09-10-15 polyps f/u 66yrs BMD:   07-26-18 osteopenia TDaP:  2015 Pneumonia vaccine(s):  2020 Shingrix:   Done 2018 Hep C testing: neg 2017 Screening Labs: ***   reports that she has never smoked. She has never used smokeless tobacco. She reports previous alcohol use. She reports that she does not use drugs.  Past Medical History:  Diagnosis Date  . Arthritis    knees, back  . Cancer ([place])   . Carcinoid tumor of lung 2012   right found incidentally on chest xray eval for r/o vasxculitis   . Carpal tunnel syndrome, bilateral   . Coronary artery calcification 07/23/2016  . Double vision    history, no current problem  . Dysrhythmia    irregular occassional, no current problem  . Frequent UTI   . HNP (herniated nucleus pulposus), cervical 01/22/2011   C6-C7  Has seen dr Vertell Limber for this and Dr Tonita Cong  Positional numbness in hands no weakness    . Hypertension   . Hypothyroidism   . Infertility, female   . 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 Obstructive sleep apnea 07/27/2014   NPSG 07/2014:  AHI 85/hr, optimal cpap 10cm.  Download 08/2014:  Good compliance, breakthru apnea on 10cm.  Changed to auto 5-12cm >> good control of AHI on f/u download ONO on CPAP:      . Osteoarthritis   . PCOS (polycystic ovarian syndrome)   . SUI (stress urinary incontinence, female)    h/o    Past Surgical History:  Procedure Laterality Date  . ANTERIOR CERVICAL DECOMP/DISCECTOMY FUSION  12/24/2017   Procedure: Cervical five-six Cervical six-seven Anterior  cervical decompression/discectomy/fusion;  Surgeon: Erline Levine, MD;  Location: [facility];  Service: Neurosurgery;;  . Wilmon Pali RELEASE Left 12/24/2017   Procedure: LEFT CARPAL TUNNEL RELEASE;  Surgeon: Erline Levine, MD;  Location: [facility];  Service: Neurosurgery;  Laterality: Left;  . CESAREAN SECTION     x 1 - twins  . COLONOSCOPY    . DILATATION & CURRETTAGE/HYSTEROSCOPY WITH RESECTOCOPE N/A 07/18/2013   Procedure: DILATATION & CURETTAGE/HYSTEROSCOPY WITH RESECTOCOPE;  Surgeon: Lyman Speller, MD;  Location: [facility];  Service: Gynecology;  Laterality: N/A;  mass resection  . Lung tumor removed     Rt lung carcinoid  . MOUTH SURGERY     pre-cancerous ulcer removed  . SHOULDER OPEN ROTATOR CUFF REPAIR  07/20/2010   Right  . WISDOM TOOTH EXTRACTION      Current Outpatient Medications  Medication Sig Dispense Refill  . aspirin EC 81 MG tablet Take 81 mg by mouth at bedtime.    [name] atorvastatin (LIPITOR) 40 MG tablet Take 1 tablet (40 mg total) by mouth daily. 90 tablet 3  . betamethasone dipropionate (DIPROLENE) 0.05 % ointment Do not use more than 5 days in a row or more than 7 days each month. 45 g 1  . CALCIUM PO Take 1 tablet by mouth at bedtime.     [name] diltiazem (CARDIZEM CD) 180 MG 24 hr capsule Take  1 capsule (180 mg total) by mouth daily. 90 capsule 3  . Dulaglutide (TRULICITY) 1.5 LK/4.4WN SOPN Inject 0.5 mLs (1.5 mg total) into the skin once a week. 4 pen 1  . estradiol (ESTRACE VAGINAL) 0.1 MG/GM vaginal cream INSERT 1 GM VAGINALLY TWICE WEEKLY 42.5 g 3  . hydrochlorothiazide (MICROZIDE) 12.5 MG capsule TAKE 1 CAPSULE DAILY.   SCHEDULE ANNUAL VISIT 90 capsule 3  . methocarbamol (ROBAXIN) 500 MG tablet Take 1 tablet (500 mg total) by mouth 3 (three) times daily. 90 tablet 0  . metoprolol succinate (TOPROL-XL) 50 MG 24 hr tablet Take 50 mg by mouth daily. Take with or immediately following a meal.    . Multiple Vitamins-Minerals (PRESERVISION AREDS 2 PO) Take 1 tablet by mouth 2  (two) times daily.    . naproxen sodium (ALEVE) 220 MG tablet Take 220 mg by mouth 2 (two) times daily as needed (for pain.).    [name] SYNTHROID 137 MCG tablet TAKE 1 TABLET DAILY 90 tablet 3   No current facility-administered medications for this visit.    Family History  Problem Relation Age of Onset  . Stroke Mother        died age 33  . Hypertension Mother   . Sudden death Mother   . Alcoholism Mother   . Coronary artery disease Father        died age 8  . Hypertension Father   . Liver disease Father   . Alcohol abuse Father   . Obesity Father   . Parkinson's disease Brother   . Diabetes Brother   . Hypertension Brother   . Hypertension Maternal Uncle   . Colon cancer Neg Hx   . Rectal cancer Neg Hx   . Stomach cancer Neg Hx     Review of Systems  Exam:   LMP 02/12/2011      General appearance: alert, cooperative and appears stated age Head: Normocephalic, without obvious abnormality, atraumatic Neck: no adenopathy, supple, symmetrical, trachea midline and thyroid {EXAM; THYROID:18604} Lungs: clear to auscultation bilaterally Breasts: {Exam; breast:13139::"normal appearance, no masses or tenderness"} Heart: regular rate and rhythm Abdomen: soft, non-tender; bowel sounds normal; no masses,  no organomegaly Extremities: extremities normal, atraumatic, no cyanosis or edema Skin: Skin color, texture, turgor normal. No rashes or lesions Lymph nodes: Cervical, supraclavicular, and axillary nodes normal. No abnormal inguinal nodes palpated Neurologic: Grossly normal   Pelvic: External genitalia:  no lesions              Urethra:  normal appearing urethra with no masses, tenderness or lesions              Bartholins and Skenes: normal                 Vagina: normal appearing vagina with normal color and discharge, no lesions              Cervix: {exam; cervix:14595}              Pap taken: {yes no:314532} Bimanual Exam:  Uterus:  {exam; uterus:12215}               Adnexa: {exam; adnexa:12223}               Rectovaginal: Confirms               Anus:  normal sphincter tone, no lesions  Chaperone, ***Terence Lux, CMA, was present for exam.  A:  Well Woman with normal exam  P:   {  plan; gyn:5269::"mammogram","pap smear","return annually or prn"}

## 2020-07-26 ENCOUNTER — Other Ambulatory Visit (HOSPITAL_COMMUNITY)
Admission: RE | Admit: 2020-07-26 | Discharge: 2020-07-26 | Disposition: A | Discharge status: Home / Self Care | Payer: Medicare Other | Source: Ambulatory Visit | Attending: Obstetrics & Gynecology | Admitting: Obstetrics & Gynecology

## 2020-07-26 ENCOUNTER — Other Ambulatory Visit: Payer: Self-pay

## 2020-07-26 ENCOUNTER — Encounter: Payer: Self-pay | Admitting: Obstetrics & Gynecology

## 2020-07-26 ENCOUNTER — Ambulatory Visit (INDEPENDENT_AMBULATORY_CARE_PROVIDER_SITE_OTHER): Payer: Medicare Other | Admitting: Obstetrics & Gynecology

## 2020-07-26 ENCOUNTER — Ambulatory Visit: Payer: Medicare Other | Admitting: Obstetrics & Gynecology

## 2020-07-26 VITALS — BP 120/78 | HR 68 | Resp 16 | Ht 59.0 in | Wt 213.0 lb

## 2020-07-26 DIAGNOSIS — Z01419 Encounter for gynecological examination (general) (routine) without abnormal findings: Secondary | ICD-10-CM | POA: Diagnosis not present

## 2020-07-26 DIAGNOSIS — Z87440 Personal history of urinary (tract) infections: Secondary | ICD-10-CM | POA: Diagnosis not present

## 2020-07-26 DIAGNOSIS — Z124 Encounter for screening for malignant neoplasm of cervix: Secondary | ICD-10-CM | POA: Diagnosis present

## 2020-07-26 DIAGNOSIS — I2510 Atherosclerotic heart disease of native coronary artery without angina pectoris: Secondary | ICD-10-CM

## 2020-07-26 DIAGNOSIS — B009 Herpesviral infection, unspecified: Secondary | ICD-10-CM | POA: Diagnosis not present

## 2020-07-26 DIAGNOSIS — I2584 Coronary atherosclerosis due to calcified coronary lesion: Secondary | ICD-10-CM | POA: Diagnosis not present

## 2020-07-26 DIAGNOSIS — M8580 Other specified disorders of bone density and structure, unspecified site: Secondary | ICD-10-CM

## 2020-07-26 MED ORDER — ESTRADIOL 0.1 MG/GM VA CREA: TOPICAL_CREAM | VAGINAL | 3 refills | Status: DC

## 2020-07-26 NOTE — Progress Notes (Signed)
64 y.o. G2P2 Married White or Caucasian female here for breast and pelvic exam.  I am also following her for chronic vulvar itching that has been biopsied and also am following her BMDs.  Has a lot going on in her personal life.  Daughter, Mickel Baas, is a Pharmacist, hospital who just went back to school.  A child in her class was just diagnosed with Covid but only the four children who sit around this child are on quarantine.  Pt is anxious about daughter health and safety this year.   Daughter had Covid vaccination in May.    Having some low back pain issues.  She is seeing a PT.  Now having constipation issues.  Medication changes have likely contributed.  She is going to have a colonoscopy this fall.  It is due in October.  Pt is going to call for this apt.    Saw urologist about urinary incontinence.  Has tried some medications.  Has not felt these are very successful for her.    Denies vaginal bleeding.    Patient's last menstrual period was 02/12/2011.          Sexually active: No.  H/O STD:  no  Health Maintenance: PCP:  [facility].  Last wellness appt was 09/2019.  Pt is going to see Dr. Regis Bill before end of year.  Pt did have blood work in 09/2019.   Vaccines are up to date:  yes Colonoscopy:  10/16, follow up due 5 years.  Pt planning to have this year MMG:  08/15/2019 BMD:  07-26-18 osteopenia.  Plan to repeat next year.  T score in left hip was -2.4. Last pap smear:  03/09/2017.  Neg with neg HR HPV.  H/o abnormal pap smear:  no    reports that she has never smoked. She has never used smokeless tobacco. She reports previous alcohol use. She reports that she does not use drugs.  Past Medical History:  Diagnosis Date   Arthritis    knees, back   Bursitis    Cancer ([place])    Carcinoid tumor of lung 2012   right found incidentally on chest xray eval for r/o vasxculitis    Carpal tunnel syndrome, bilateral    Coronary artery calcification 07/23/2016   Double vision    history, no current  problem   Dysrhythmia    irregular occassional, no current problem   Frequent UTI    HNP (herniated nucleus pulposus), cervical 01/22/2011   C6-C7  Has seen dr Vertell Limber for this and Dr Tonita Cong  Positional numbness in hands no weakness     Hypertension    Hypothyroidism    Infertility, female    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  Download 08/2014:  Good compliance, breakthru apnea on 10cm.  Changed to auto 5-12cm >> good control of AHI on f/u download ONO on CPAP:       Osteoarthritis    PCOS (polycystic ovarian syndrome)    SUI (stress urinary incontinence, female)    h/o    Past Surgical History:  Procedure Laterality Date   ANTERIOR CERVICAL DECOMP/DISCECTOMY FUSION  12/24/2017   Procedure: Cervical five-six Cervical six-seven Anterior cervical decompression/discectomy/fusion;  Surgeon: Erline Levine, MD;  Location: [facility];  Service: Neurosurgery;;   CARPAL TUNNEL RELEASE Left 12/24/2017   Procedure: LEFT CARPAL TUNNEL RELEASE;  Surgeon: Erline Levine, MD;  Location: [facility];  Service: Neurosurgery;  Laterality: Left;  CESAREAN SECTION     x 1 - twins   COLONOSCOPY     DILATATION & CURRETTAGE/HYSTEROSCOPY WITH RESECTOCOPE N/A 07/18/2013   Procedure: [place];  Surgeon: Lyman Speller, MD;  Location: [facility];  Service: Gynecology;  Laterality: N/A;  mass resection   Lung tumor removed     Rt lung carcinoid   MOUTH SURGERY     pre-cancerous ulcer removed   SHOULDER OPEN ROTATOR CUFF REPAIR  07/20/2010   Right   WISDOM TOOTH EXTRACTION      Current Outpatient Medications  Medication Sig Dispense Refill   aspirin EC 81 MG tablet Take 81 mg by mouth at bedtime.     atorvastatin (LIPITOR) 40 MG tablet Take 1 tablet (40 mg total) by mouth daily. 90 tablet 3   augmented betamethasone dipropionate (DIPROLENE-AF) 0.05 % ointment  Apply topically.     CALCIUM PO Take 1 tablet by mouth at bedtime.      diltiazem (CARDIZEM CD) 180 MG 24 hr capsule Take 1 capsule (180 mg total) by mouth daily. 90 capsule 3   Dulaglutide (TRULICITY) 1.5 ZO/1.0RU SOPN Inject 0.5 mLs (1.5 mg total) into the skin once a week. 4 pen 1   estradiol (ESTRACE VAGINAL) 0.1 MG/GM vaginal cream INSERT 1 GM VAGINALLY TWICE WEEKLY 42.5 g 3   hydrochlorothiazide (MICROZIDE) 12.5 MG capsule TAKE 1 CAPSULE DAILY.   SCHEDULE ANNUAL VISIT 90 capsule 3   methocarbamol (ROBAXIN) 500 MG tablet Take 1 tablet (500 mg total) by mouth 3 (three) times daily. 90 tablet 0   metoprolol succinate (TOPROL-XL) 50 MG 24 hr tablet Take 50 mg by mouth daily. Take with or immediately following a meal.     Multiple Vitamins-Minerals (PRESERVISION AREDS 2 PO) Take 1 tablet by mouth 2 (two) times daily.     naproxen sodium (ALEVE) 220 MG tablet Take 220 mg by mouth 2 (two) times daily as needed (for pain.).     SYNTHROID 137 MCG tablet TAKE 1 TABLET DAILY 90 tablet 3   No current facility-administered medications for this visit.    Family History  Problem Relation Age of Onset   Stroke Mother        died age 16   Hypertension Mother    Sudden death Mother    Alcoholism Mother    Coronary artery disease Father        died age 34   Hypertension Father    Liver disease Father    Alcohol abuse Father    Obesity Father    Parkinson's disease Brother    Diabetes Brother    Hypertension Brother    Hypertension Maternal Uncle    Colon cancer Neg Hx    Rectal cancer Neg Hx    Stomach cancer Neg Hx     Review of Systems  All other systems reviewed and are negative.   Exam:   BP 120/78    Pulse 68    Resp 16    Ht 4\' 11"  (1.499 m)    Wt 213 lb (96.6 kg)    LMP 02/12/2011    BMI 43.02 kg/m   Height: 4\' 11"  (149.9 cm)  General appearance: alert, cooperative and appears stated age Breasts: normal appearance, no masses or tenderness Abdomen:  soft, non-tender; bowel sounds normal; no masses,  no organomegaly Lymph nodes: Cervical, supraclavicular, and axillary nodes normal.  No abnormal inguinal nodes palpated Neurologic: Grossly normal  Pelvic: External genitalia:  no lesions  Urethra:  normal appearing urethra with no masses, tenderness or lesions              Bartholins and Skenes: normal                 Vagina: normal appearing vagina with normal color and discharge, no lesions              Cervix: no lesions              Pap taken: Yes.   Bimanual Exam:  Uterus:  normal size, contour, position, consistency, mobility, non-tender              Adnexa: normal adnexa and no mass, fullness, tenderness               Rectovaginal: Confirms               Anus:  normal sphincter tone, no lesions  Chaperone, Royal Hawthorn, CMA, was present for exam.  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 Regis Bill  P:   Mammogram guidelines reviewed.  Doing yearly. pap smear obtained today RF for estrace vaginal cream 1 gram pv twice weekly.  #42.5 gram/3RF Colonoscopy will be done this fall BMD discussed.  Recommended for this year but pt would like to wait one more year.  Feel this is reasonable.    32 minutes spent in total with pt return annually or prn

## 2020-07-30 ENCOUNTER — Other Ambulatory Visit: Payer: Self-pay

## 2020-07-30 LAB — CYTOLOGY - PAP: Diagnosis: NEGATIVE

## 2020-07-31 ENCOUNTER — Encounter: Payer: Self-pay | Admitting: Internal Medicine

## 2020-07-31 ENCOUNTER — Ambulatory Visit (INDEPENDENT_AMBULATORY_CARE_PROVIDER_SITE_OTHER): Payer: Medicare Other | Admitting: Internal Medicine

## 2020-07-31 VITALS — BP 128/78 | HR 77 | Temp 98.4°F | Ht 59.0 in | Wt 214.8 lb

## 2020-07-31 DIAGNOSIS — Z6841 Body Mass Index (BMI) 40.0 and over, adult: Secondary | ICD-10-CM

## 2020-07-31 DIAGNOSIS — I2510 Atherosclerotic heart disease of native coronary artery without angina pectoris: Secondary | ICD-10-CM

## 2020-07-31 DIAGNOSIS — E063 Autoimmune thyroiditis: Secondary | ICD-10-CM | POA: Diagnosis not present

## 2020-07-31 DIAGNOSIS — D3A090 Benign carcinoid tumor of the bronchus and lung: Secondary | ICD-10-CM | POA: Diagnosis not present

## 2020-07-31 DIAGNOSIS — G4733 Obstructive sleep apnea (adult) (pediatric): Secondary | ICD-10-CM | POA: Diagnosis not present

## 2020-07-31 DIAGNOSIS — I2584 Coronary atherosclerosis due to calcified coronary lesion: Secondary | ICD-10-CM

## 2020-07-31 LAB — T4, FREE: Free T4: 1.3 ng/dL (ref 0.8–1.8)

## 2020-07-31 LAB — TSH: TSH: 0.98 mIU/L (ref 0.40–4.50)

## 2020-07-31 NOTE — Patient Instructions (Signed)
Lab  And chest x ray  .  Today

## 2020-07-31 NOTE — Progress Notes (Signed)
Chief Complaint  Patient presents with  . Medication Management    Doing well  . Hypothyroidism    HPI: Nichole Smith 64 y.o. come in for Chronic disease management   Weight management  Difficult lowering calories   Off metformin  Now on trulicity with some helps Cards routine  bp ok  Gyne had fu  Back issues causes  Limited walking   Bursitis .  Asks about  nsaids   Risk benefit asks about  Tumeric and FO  Hair shedding ? If  Tsh.    Could be effecting   Last check was tsh uln  Needs routine  Yearly fu c xray cause of hx of carcinoid pulm tumor   ROS: See pertinent positives and negatives per HPI.  Past Medical History:  Diagnosis Date  . Arthritis    knees, back  . Bursitis   . Cancer ([place])   . Carcinoid tumor of lung 2012   right found incidentally on chest xray eval for r/o vasxculitis   . Carpal tunnel syndrome, bilateral   . Coronary artery calcification 07/23/2016  . Double vision    history, no current problem  . Dysrhythmia    irregular occassional, no current problem  . Frequent UTI   . HNP (herniated nucleus pulposus), cervical 01/22/2011   C6-C7  Has seen dr Vertell Limber for this and Dr Tonita Cong  Positional numbness in hands no weakness    . Hypertension   . Hypothyroidism   . Infertility, female   . 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 Obstructive sleep apnea 07/27/2014   NPSG 07/2014:  AHI 85/hr, optimal cpap 10cm.  Download 08/2014:  Good compliance, breakthru apnea on 10cm.  Changed to auto 5-12cm >> good control of AHI on f/u download ONO on CPAP:      . Osteoarthritis   . PCOS (polycystic ovarian syndrome)   . SUI (stress urinary incontinence, female)    h/o    Family History  Problem Relation Age of Onset  . Stroke Mother        died age 42  . Hypertension Mother   . Sudden death Mother   . Alcoholism Mother   . Coronary artery disease Father        died age 31  . Hypertension Father   . Liver disease Father   . Alcohol abuse Father     . Obesity Father   . Parkinson's disease Brother   . Diabetes Brother   . Hypertension Brother   . Hypertension Maternal Uncle   . Colon cancer Neg Hx   . Rectal cancer Neg Hx   . Stomach cancer Neg Hx     Social History   Socioeconomic History  . Marital status: Married    Spouse name: [name]  . Number of children: 2  . Years of education: Not on file  . Highest education level: Not on file  Occupational History  . 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 Smoking status: Never Smoker  . Smokeless tobacco: Never Used  Vaping Use  . Vaping Use: Never used  Substance and Sexual Activity  . Alcohol use: Not Currently  . Drug use: No  . Sexual activity: Not Currently    Partners: Male    Birth control/protection: Post-menopausal  Other Topics Concern  . Not on file  Social History Narrative   hhof 2    2 Children at college and  beyond  Church organist    Married    Retired age 66 2015   Recently moved taking care of grandchildren during the week.   Social Determinants of Health   Financial Resource Strain:   . Difficulty of Paying Living Expenses: Not on file  Food Insecurity:   . Worried About Charity fundraiser in the Last Year: Not on file  . Ran Out of Food in the Last Year: Not on file  Transportation Needs:   . Lack of Transportation (Medical): Not on file  . Lack of Transportation (Non-Medical): Not on file  Physical Activity:   . Days of Exercise per Week: Not on file  . Minutes of Exercise per Session: Not on file  Stress:   . Feeling of Stress : Not on file  Social Connections:   . Frequency of Communication with Friends and Family: Not on file  . Frequency of Social Gatherings with Friends and Family: Not on file  . Attends Religious Services: Not on file  . Active Member of Clubs or Organizations: Not on file  . Attends Archivist Meetings: Not on file  . Marital Status: Not on file     Outpatient Medications Prior to Visit  Medication Sig Dispense Refill  . aspirin EC 81 MG tablet Take 81 mg by mouth at bedtime.    [name] atorvastatin (LIPITOR) 40 MG tablet Take 1 tablet (40 mg total) by mouth daily. 90 tablet 3  . augmented betamethasone dipropionate (DIPROLENE-AF) 0.05 % ointment Apply topically.    [name] CALCIUM PO Take 1 tablet by mouth at bedtime.     [name] diltiazem (CARDIZEM CD) 180 MG 24 hr capsule Take 1 capsule (180 mg total) by mouth daily. 90 capsule 3  . Dulaglutide (TRULICITY) 1.5 UX/3.2GM SOPN Inject 0.5 mLs (1.5 mg total) into the skin once a week. 4 pen 1  . estradiol (ESTRACE VAGINAL) 0.1 MG/GM vaginal cream INSERT 1 GM VAGINALLY TWICE WEEKLY 42.5 g 3  . hydrochlorothiazide (MICROZIDE) 12.5 MG capsule TAKE 1 CAPSULE DAILY.   SCHEDULE ANNUAL VISIT 90 capsule 3  . methocarbamol (ROBAXIN) 500 MG tablet Take 1 tablet (500 mg total) by mouth 3 (three) times daily. 90 tablet 0  . metoprolol succinate (TOPROL-XL) 50 MG 24 hr tablet Take 50 mg by mouth daily. Take with or immediately following a meal.    . Multiple Vitamins-Minerals (PRESERVISION AREDS 2 PO) Take 1 tablet by mouth 2 (two) times daily.    . naproxen sodium (ALEVE) 220 MG tablet Take 220 mg by mouth 2 (two) times daily as needed (for pain.).    [name] SYNTHROID 137 MCG tablet TAKE 1 TABLET DAILY 90 tablet 3   No facility-administered medications prior to visit.     EXAM:  BP 128/78   Pulse 77   Temp 98.4 F (36.9 C) (Oral)   Ht 4\' 11"  (1.499 m)   Wt 214 lb 12.8 oz (97.4 kg)   LMP 02/12/2011   SpO2 98%   BMI 43.38 kg/m   Body mass index is 43.38 kg/m.  GENERAL: vitals reviewed and listed above, alert, oriented, appears well hydrated and in no acute distress HEENT: atraumatic, conjunctiva  clear, no obvious abnormalities on inspection of external nose and ears OP : masked  NECK: no obvious masses on inspection palpation  LUNGS: clear to auscultation bilaterally, no wheezes, rales or rhonchi, good  air movement CV: HRRR, no clubbing cyanosis or  peripheral edema nl cap refill  MS: moves all extremities  without noticeable focal  abnormality PSYCH: pleasant and cooperative, no obvious depression or anxiety Lab Results  Component Value Date   WBC 8.1 06/27/2020   HGB 15.9 06/27/2020   HCT 47.9 (H) 06/27/2020   PLT 243 06/27/2020   GLUCOSE 82 06/27/2020   CHOL 117 01/02/2020   TRIG 57 01/02/2020   HDL 53 01/02/2020   LDLCALC 51 01/02/2020   ALT 28 06/27/2020   AST 27 06/27/2020   NA 140 06/27/2020   K 4.5 06/27/2020   CL 101 06/27/2020   CREATININE 0.62 06/27/2020   BUN 21 06/27/2020   CO2 26 06/27/2020   TSH 4.260 06/27/2020   INR 0.96 06/11/2011   HGBA1C 5.4 06/27/2020   BP Readings from Last 3 Encounters:  07/31/20 128/78  07/26/20 120/78  07/19/20 121/77    ASSESSMENT AND PLAN:  Discussed the following assessment and plan:  Thyroiditis, autoimmune - Plan: TSH, T4, free, T4, free, TSH  Carcinoid tumor of lung, unspecified whether malignant - yearly chest x ray  - Plan: DG Chest 2 View  Class 3 severe obesity with serious comorbidity and body mass index (BMI) of 40.0 to 44.9 in adult, unspecified obesity type ([place])  Obstructive sleep apnea Tumeric  Ok to try  Flu shot pending  Repeat tsh   Ok today   Has been in better range in past   consider changing dosing   32 minutes  Review counsel discussion  -Patient advised to return or notify health care team  if  new concerns arise.  Patient Instructions  Lab  And chest x ray  .  Today   Standley Brooking. [facility] M.D.

## 2020-08-01 ENCOUNTER — Other Ambulatory Visit: Payer: Self-pay | Admitting: Internal Medicine

## 2020-08-01 NOTE — Telephone Encounter (Signed)
Since better   This could just be a one time  Results   And not confirmed  So keep going with current  Dosing

## 2020-08-01 NOTE — Progress Notes (Signed)
Tsh in better range   no change in dosing advised

## 2020-08-03 ENCOUNTER — Ambulatory Visit
Admission: RE | Admit: 2020-08-03 | Discharge: 2020-08-03 | Disposition: A | Discharge status: Home / Self Care | Payer: Medicare Other | Source: Ambulatory Visit | Attending: Internal Medicine | Admitting: Internal Medicine

## 2020-08-03 DIAGNOSIS — D3A090 Benign carcinoid tumor of the bronchus and lung: Secondary | ICD-10-CM

## 2020-08-03 NOTE — Progress Notes (Signed)
No acute disease

## 2020-08-07 ENCOUNTER — Other Ambulatory Visit: Payer: Self-pay

## 2020-08-07 ENCOUNTER — Ambulatory Visit (AMBULATORY_SURGERY_CENTER): Payer: Self-pay | Admitting: *Deleted

## 2020-08-07 VITALS — Ht 59.0 in | Wt 216.0 lb

## 2020-08-07 DIAGNOSIS — Z86010 Personal history of colonic polyps: Secondary | ICD-10-CM

## 2020-08-07 MED ORDER — SUPREP BOWEL PREP KIT 17.5-3.13-1.6 GM/177ML PO SOLN: 1.0000 | Freq: Once | ORAL | 0 refills | Status: AC

## 2020-08-07 NOTE — Progress Notes (Signed)
cov vax x 2   No egg or soy allergy known to patient  No issues with past sedation with any surgeries or procedures no intubation problems in the past  No FH of Malignant Hyperthermia No diet pills per patient No home 02 use per patient  No blood thinners per patient  Pt states  issues with constipation - OCC Stool softener and uses Fiber- not daily- goes every other day and now soft and more normal  No A fib or A flutter  EMMI video to pt or via [place] 19 guidelines implemented in PV today with Pt and RN   Due to the COVID-19 pandemic we are asking patients to follow these guidelines. Please only bring one care partner. Please be aware that your care partner may wait in the car in the parking lot or if they feel like they will be too hot to wait in the car, they may wait in the lobby on the 4th floor. All care partners are required to wear a mask the entire time (we do not have any that we can provide them), they need to practice social distancing, and we will do a Covid check for all patient's and care partners when you arrive. Also we will check their temperature and your temperature. If the care partner waits in their car they need to stay in the parking lot the entire time and we will call them on their cell phone when the patient is ready for discharge so they can bring the car to the front of the building. Also all patient's will need to wear a mask into building.

## 2020-08-15 ENCOUNTER — Ambulatory Visit (INDEPENDENT_AMBULATORY_CARE_PROVIDER_SITE_OTHER): Payer: Medicare Other | Admitting: Family Medicine

## 2020-08-15 ENCOUNTER — Encounter (INDEPENDENT_AMBULATORY_CARE_PROVIDER_SITE_OTHER): Payer: Self-pay | Admitting: Family Medicine

## 2020-08-15 ENCOUNTER — Other Ambulatory Visit: Payer: Self-pay

## 2020-08-15 VITALS — BP 120/67 | HR 67 | Temp 98.2°F | Ht 59.0 in | Wt 210.0 lb

## 2020-08-15 DIAGNOSIS — E66813 Obesity, class 3: Secondary | ICD-10-CM

## 2020-08-15 DIAGNOSIS — R7303 Prediabetes: Secondary | ICD-10-CM | POA: Diagnosis not present

## 2020-08-15 DIAGNOSIS — Z6841 Body Mass Index (BMI) 40.0 and over, adult: Secondary | ICD-10-CM | POA: Diagnosis not present

## 2020-08-15 DIAGNOSIS — I10 Essential (primary) hypertension: Secondary | ICD-10-CM | POA: Diagnosis not present

## 2020-08-15 MED ORDER — TRULICITY 1.5 MG/0.5ML ~~LOC~~ SOAJ: 1.5000 mg | SUBCUTANEOUS | 0 refills | Status: DC

## 2020-08-16 ENCOUNTER — Encounter (INDEPENDENT_AMBULATORY_CARE_PROVIDER_SITE_OTHER): Payer: Self-pay | Admitting: Family Medicine

## 2020-08-16 NOTE — Progress Notes (Signed)
Chief Complaint:   OBESITY Page is here to discuss her progress with her obesity treatment plan along with follow-up of her obesity related diagnoses. Nichole Smith is on keeping a food journal and adhering to recommended goals of 1300-1500 calories and 85-90 grams of protein daily and states she is following her eating plan approximately 50% of the time. Nichole Smith states she is doing water aerobics, strengthening, and walking for 30 minutes 7 times per week.  Today's visit was #: 28 Starting weight: 238 lbs Starting date: 01/12/2019 Today's weight: 210 lbs Today's date: 08/15/2020 Total lbs lost to date: 28 Total lbs lost since last in-office visit: 1  Interim History: Nichole Smith is averaging between 1500-1600 calories per day. She feels that the Trulicity is helping with polyphagia. She keeps track of her calories and protein diligently. She has had decrease in muscle mass which was 107.8 lb of muscle in Aug 2020 and today 100.6 lbs.  Subjective:   1. Pre-diabetes Nichole Smith is on Trulicity for appetite. Last A1c was 5.4 and fasting insulin was 13.7. She continues to work on diet and exercise to decrease her risk of diabetes.   Lab Results  Component Value Date   HGBA1C 5.4 06/27/2020   Lab Results  Component Value Date   INSULIN 13.7 06/27/2020   INSULIN 13.5 01/02/2020   INSULIN 21.5 08/22/2019   INSULIN 20.4 01/12/2019   2. Essential hypertension Nichole Smith's blood pressure is well controlled on Cardizem, hydrochlorothiazide, and metoprolol. Cardiovascular ROS: no chest pain or dyspnea on exertion.  BP Readings from Last 3 Encounters:  08/15/20 120/67  07/31/20 128/78  07/26/20 120/78   Lab Results  Component Value Date   CREATININE 0.62 06/27/2020   CREATININE 0.64 01/02/2020   CREATININE 0.68 08/22/2019   Assessment/Plan:   1. Pre-diabetes We will refill Trulicity with a 90 day supply, with no refill.   - Dulaglutide (TRULICITY) 1.5 DE/0.8XK SOPN; Inject 1.5 mg into the skin once  a week.  Dispense: 7 mL; Refill: 0  2. Essential hypertension Nichole Smith will continue all her anti-hypertensives.  3. Class 3 severe obesity with serious comorbidity and body mass index (BMI) of 40.0 to 44.9 in adult, unspecified obesity type [facility]) Nichole Smith is currently in the action stage of change. As such, her goal is to continue with weight loss efforts. She has agreed to keeping a food journal and adhering to recommended goals of 1200-1300 calories and 80 grams of protein daily.   Nichole Smith's weight loss is extremely slow. We will try reducing calorie goal.  Exercise goals: As is.  Behavioral modification strategies: planning for success.  Jona has agreed to follow-up with our clinic in 3 weeks with Nichole Potash, Nichole Smith. She would like to see someone different for new ideas. She was informed of the importance of frequent follow-up visits to maximize her success with intensive lifestyle modifications for her multiple health conditions.   Objective:   Blood pressure 120/67, pulse 67, temperature 98.2 F (36.8 C), temperature source Oral, height 4\' 11"  (1.499 m), weight 210 lb (95.3 kg), last menstrual period 02/12/2011, SpO2 96 %. Body mass index is 42.41 kg/m.  General: Cooperative, alert, well developed, in no acute distress. HEENT: Conjunctivae and lids unremarkable. Cardiovascular: Regular rhythm.  Lungs: Normal work of breathing. Neurologic: No focal deficits.   Lab Results  Component Value Date   CREATININE 0.62 06/27/2020   BUN 21 06/27/2020   NA 140 06/27/2020   K 4.5 06/27/2020   CL 101 06/27/2020  CO2 26 06/27/2020   Lab Results  Component Value Date   ALT 28 06/27/2020   AST 27 06/27/2020   ALKPHOS 74 06/27/2020   BILITOT 0.4 06/27/2020   Lab Results  Component Value Date   HGBA1C 5.4 06/27/2020   HGBA1C 5.5 01/02/2020   HGBA1C 5.6 08/22/2019   HGBA1C 5.9 (H) 01/12/2019   HGBA1C 5.9 06/07/2018   Lab Results  Component Value Date   INSULIN 13.7 06/27/2020    INSULIN 13.5 01/02/2020   INSULIN 21.5 08/22/2019   INSULIN 20.4 01/12/2019   Lab Results  Component Value Date   TSH 0.98 07/31/2020   Lab Results  Component Value Date   CHOL 117 01/02/2020   HDL 53 01/02/2020   LDLCALC 51 01/02/2020   TRIG 57 01/02/2020   CHOLHDL 2 06/07/2018   Lab Results  Component Value Date   WBC 8.1 06/27/2020   HGB 15.9 06/27/2020   HCT 47.9 (H) 06/27/2020   MCV 94 06/27/2020   PLT 243 06/27/2020   No results found for: IRON, TIBC, FERRITIN  Obesity Behavioral Intervention:   Approximately 15 minutes were spent on the discussion below.  ASK: We discussed the diagnosis of obesity with Nichole Smith today and Nichole Smith agreed to give [name] permission to discuss obesity behavioral modification therapy today.  ASSESS: Nichole Smith has the diagnosis of obesity and her BMI today is 42.39. Nichole Smith is in the action stage of change.   ADVISE: Nichole Smith was educated on the multiple health risks of obesity as well as the benefit of weight loss to improve her health. She was advised of the need for long term treatment and the importance of lifestyle modifications to improve her current health and to decrease her risk of future health problems.  AGREE: Multiple dietary modification options and treatment options were discussed and Nichole Smith agreed to follow the recommendations documented in the above note.  ARRANGE: Nichole Smith was educated on the importance of frequent visits to treat obesity as outlined per CMS and USPSTF guidelines and agreed to schedule her next follow up appointment today.  Attestation Statements:   Reviewed by clinician on day of visit: allergies, medications, problem list, medical history, surgical history, family history, social history, and previous encounter notes.   [name], am acting as Location manager for [employer], FNP-C.  I have reviewed the above documentation for accuracy and completeness, and I agree with the above. -  [name],  FNP

## 2020-08-21 LAB — HM MAMMOGRAPHY

## 2020-09-03 NOTE — Telephone Encounter (Signed)
Ok to get flu vaccine and pneumovax 23 at same time  sooner than Nov 4   The one year  Is only to wait for better immunity and  3 weeks shouldn't make a  Clinical difference

## 2020-09-05 ENCOUNTER — Encounter (INDEPENDENT_AMBULATORY_CARE_PROVIDER_SITE_OTHER): Payer: Self-pay | Admitting: Physician Assistant

## 2020-09-05 ENCOUNTER — Ambulatory Visit (INDEPENDENT_AMBULATORY_CARE_PROVIDER_SITE_OTHER): Payer: Medicare Other | Admitting: Physician Assistant

## 2020-09-05 ENCOUNTER — Other Ambulatory Visit: Payer: Self-pay

## 2020-09-05 VITALS — BP 117/65 | HR 66 | Temp 98.2°F | Ht 59.0 in | Wt 213.0 lb

## 2020-09-05 DIAGNOSIS — R7303 Prediabetes: Secondary | ICD-10-CM

## 2020-09-05 DIAGNOSIS — E7849 Other hyperlipidemia: Secondary | ICD-10-CM | POA: Diagnosis not present

## 2020-09-05 DIAGNOSIS — Z6841 Body Mass Index (BMI) 40.0 and over, adult: Secondary | ICD-10-CM | POA: Diagnosis not present

## 2020-09-05 NOTE — Progress Notes (Signed)
Chief Complaint:   OBESITY Nichole Smith is here to discuss her progress with her obesity treatment plan along with follow-up of her obesity related diagnoses. Nichole Smith is keeping a food journal and adhering to recommended goals of 1300-1500 calories and 85-90 grams of protein and states she is following her eating plan approximately 100% of the time. Nichole Smith states she is walking/swimming 40 minutes 7 times per week.  Today's visit was #: 49 Starting weight: 238 lbs Starting date: 01/12/2019 Today's weight: 213 lbs Today's date: 09/05/2020 Total lbs lost to date: 25 Total lbs lost since last in-office visit: 0  Interim History: Nichole Smith is hitting 1700 calories most days recently due to eating out, celebrations, or just feeling hungry, especially after lunch. [place].  Subjective:   Prediabetes. Nichole Smith has a diagnosis of prediabetes based on her elevated HgA1c and was informed this puts her at greater risk of developing diabetes. She continues to work on diet and exercise to decrease her risk of diabetes. She denies nausea or hypoglycemia. Nichole Smith is on Trulicity weekly, which is helping decrease her hunger slightly. She continues to be hungry in the afternoon. Metformin did not help her hunger.  Lab Results  Component Value Date   HGBA1C 5.4 06/27/2020   Lab Results  Component Value Date   INSULIN 13.7 06/27/2020   INSULIN 13.5 01/02/2020   INSULIN 21.5 08/22/2019   INSULIN 20.4 01/12/2019   Other hyperlipidemia. Nichole Smith is on Lipitor and denies chest pain or myalgias. She is exercising more often.  Lab Results  Component Value Date   CHOL 117 01/02/2020   HDL 53 01/02/2020   LDLCALC 51 01/02/2020   TRIG 57 01/02/2020   CHOLHDL 2 06/07/2018   Lab Results  Component Value Date   ALT 28 06/27/2020   AST 27 06/27/2020   ALKPHOS 74 06/27/2020   BILITOT 0.4 06/27/2020   The ASCVD Risk score ([place] Mel., et al., 2013) failed to calculate  for the following reasons:   The valid total cholesterol range is 130 to 320 mg/dL  Assessment/Plan:   Prediabetes. Nichole Smith will continue to work on weight loss, exercise, and decreasing simple carbohydrates to help decrease the risk of diabetes. She will continue Trulicity as directed.   Other hyperlipidemia. Cardiovascular risk and specific lipid/LDL goals reviewed.  We discussed several lifestyle modifications today and Nichole Smith will continue to work on diet, exercise and weight loss efforts. Orders and follow up as documented in patient record. She will continue her medication as directed.   Counseling Intensive lifestyle modifications are the first line treatment for this issue.  Dietary changes: Increase soluble fiber. Decrease simple carbohydrates.  Exercise changes: Moderate to vigorous-intensity aerobic activity 150 minutes per week if tolerated.  Lipid-lowering medications: see documented in medical record.  Class 3 severe obesity with serious comorbidity and body mass index (BMI) of 40.0 to 44.9 in adult, unspecified obesity type (Nichole Smith).  Nichole Smith is currently in the action stage of change. As such, her goal is to continue with weight loss efforts. She has agreed to keeping a food journal and adhering to recommended goals of 1200-1300 calories and 80 grams of protein daily. She will add low carb wraps and increase lunch protein.  Exercise goals: Older adults should follow the adult guidelines. When older adults cannot meet the adult guidelines, they should be as physically active as their abilities and conditions will allow.   Behavioral modification strategies: meal planning and  cooking strategies and travel eating strategies.  Nichole Smith has agreed to follow-up with our clinic in 3 weeks. She was informed of the importance of frequent follow-up visits to maximize her success with intensive lifestyle modifications for her multiple health conditions.   Objective:   Blood pressure 117/65,  pulse 66, temperature 98.2 F (36.8 C), height 4\' 11"  (1.499 m), weight 213 lb (96.6 kg), last menstrual period 02/12/2011, SpO2 96 %. Body mass index is 43.02 kg/m.  General: Cooperative, alert, well developed, in no acute distress. HEENT: Conjunctivae and lids unremarkable. Cardiovascular: Regular rhythm.  Lungs: Normal work of breathing. Neurologic: No focal deficits.   Lab Results  Component Value Date   CREATININE 0.62 06/27/2020   BUN 21 06/27/2020   NA 140 06/27/2020   K 4.5 06/27/2020   CL 101 06/27/2020   CO2 26 06/27/2020   Lab Results  Component Value Date   ALT 28 06/27/2020   AST 27 06/27/2020   ALKPHOS 74 06/27/2020   BILITOT 0.4 06/27/2020   Lab Results  Component Value Date   HGBA1C 5.4 06/27/2020   HGBA1C 5.5 01/02/2020   HGBA1C 5.6 08/22/2019   HGBA1C 5.9 (H) 01/12/2019   HGBA1C 5.9 06/07/2018   Lab Results  Component Value Date   INSULIN 13.7 06/27/2020   INSULIN 13.5 01/02/2020   INSULIN 21.5 08/22/2019   INSULIN 20.4 01/12/2019   Lab Results  Component Value Date   TSH 0.98 07/31/2020   Lab Results  Component Value Date   CHOL 117 01/02/2020   HDL 53 01/02/2020   LDLCALC 51 01/02/2020   TRIG 57 01/02/2020   CHOLHDL 2 06/07/2018   Lab Results  Component Value Date   WBC 8.1 06/27/2020   HGB 15.9 06/27/2020   HCT 47.9 (H) 06/27/2020   MCV 94 06/27/2020   PLT 243 06/27/2020   No results found for: IRON, TIBC, FERRITIN  Obesity Behavioral Intervention:   Approximately 15 minutes were spent on the discussion below.  ASK: We discussed the diagnosis of obesity with Nichole Smith today and Nichole Smith agreed to give [name] permission to discuss obesity behavioral modification therapy today.  ASSESS: Nichole Smith has the diagnosis of obesity and her BMI today is 43.1. Nichole Smith is in the action stage of change.   ADVISE: Nichole Smith was educated on the multiple health risks of obesity as well as the benefit of weight loss to improve her health. She was advised of  the need for long term treatment and the importance of lifestyle modifications to improve her current health and to decrease her risk of future health problems.  AGREE: Multiple dietary modification options and treatment options were discussed and Nichole Smith agreed to follow the recommendations documented in the above note.  ARRANGE: Nichole Smith was educated on the importance of frequent visits to treat obesity as outlined per CMS and USPSTF guidelines and agreed to schedule her next follow up appointment today.  Attestation Statements:   Reviewed by clinician on day of visit: allergies, medications, problem list, medical history, surgical history, family history, social history, and previous encounter notes.  [name], am acting as transcriptionist for Abby Potash, PA-C   I have reviewed the above documentation for accuracy and completeness, and I agree with the above. Abby Potash, PA-C

## 2020-09-06 ENCOUNTER — Encounter: Payer: Medicare Other | Admitting: Gastroenterology

## 2020-09-10 ENCOUNTER — Other Ambulatory Visit: Payer: Self-pay

## 2020-09-10 ENCOUNTER — Ambulatory Visit (INDEPENDENT_AMBULATORY_CARE_PROVIDER_SITE_OTHER): Payer: Medicare Other

## 2020-09-10 DIAGNOSIS — Z23 Encounter for immunization: Secondary | ICD-10-CM | POA: Diagnosis not present

## 2020-09-10 NOTE — Progress Notes (Signed)
Per orders of Dr. Regis Bill, injection of Pneumovax23 given by Wyvonne Lenz. Patient tolerated injection well.

## 2020-09-25 ENCOUNTER — Ambulatory Visit (AMBULATORY_SURGERY_CENTER): Payer: Medicare Other | Admitting: Gastroenterology

## 2020-09-25 ENCOUNTER — Telehealth: Payer: Self-pay | Admitting: Internal Medicine

## 2020-09-25 ENCOUNTER — Other Ambulatory Visit: Payer: Self-pay

## 2020-09-25 ENCOUNTER — Encounter: Payer: Self-pay | Admitting: Gastroenterology

## 2020-09-25 VITALS — BP 125/60 | HR 61 | Temp 96.6°F | Resp 14 | Ht 59.0 in | Wt 216.0 lb

## 2020-09-25 DIAGNOSIS — Z86010 Personal history of colonic polyps: Secondary | ICD-10-CM | POA: Diagnosis not present

## 2020-09-25 DIAGNOSIS — K635 Polyp of colon: Secondary | ICD-10-CM

## 2020-09-25 DIAGNOSIS — D122 Benign neoplasm of ascending colon: Secondary | ICD-10-CM

## 2020-09-25 DIAGNOSIS — D125 Benign neoplasm of sigmoid colon: Secondary | ICD-10-CM

## 2020-09-25 MED ORDER — SODIUM CHLORIDE 0.9 % IV SOLN: 500.0000 mL | Freq: Once | INTRAVENOUS | Status: DC

## 2020-09-25 NOTE — Progress Notes (Signed)
pt tolerated well. VSS. awake and to recovery. Report given to RN.  

## 2020-09-25 NOTE — Patient Instructions (Signed)
Handouts given:   Hemorrhoids,  Diverticulosis, Polyps Resume previous diet Continue current medications Await pathology results Repeat colonoscopy in 3-5 years depending on results  YOU HAD AN ENDOSCOPIC PROCEDURE TODAY AT [facility]:   Refer to the procedure report that was given to you for any specific questions about what was found during the examination.  If the procedure report does not answer your questions, please call your gastroenterologist to clarify.  If you requested that your care partner not be given the details of your procedure findings, then the procedure report has been included in a sealed envelope for you to review at your convenience later.  YOU SHOULD EXPECT: Some feelings of bloating in the abdomen. Passage of more gas than usual.  Walking can help get rid of the air that was put into your GI tract during the procedure and reduce the bloating. If you had a lower endoscopy (such as a colonoscopy or flexible sigmoidoscopy) you may notice spotting of blood in your stool or on the toilet paper. If you underwent a bowel prep for your procedure, you may not have a normal bowel movement for a few days.  Please Note:  You might notice some irritation and congestion in your nose or some drainage.  This is from the oxygen used during your procedure.  There is no need for concern and it should clear up in a day or so.  SYMPTOMS TO REPORT IMMEDIATELY:   Following lower endoscopy (colonoscopy or flexible sigmoidoscopy):  Excessive amounts of blood in the stool  Significant tenderness or worsening of abdominal pains  Swelling of the abdomen that is new, acute  Fever of 100F or higher  For urgent or emergent issues, a gastroenterologist can be reached at any hour by calling [phone]. Do not use MyChart messaging for urgent concerns.    DIET:  We do recommend a small meal at first, but then you may proceed to your regular diet.  Drink plenty of fluids but you  should avoid alcoholic beverages for 24 hours.  ACTIVITY:  You should plan to take it easy for the rest of today and you should NOT DRIVE or use heavy machinery until tomorrow (because of the sedation medicines used during the test).    FOLLOW UP: Our staff will call the number listed on your records 48-72 hours following your procedure to check on you and address any questions or concerns that you may have regarding the information given to you following your procedure. If we do not reach you, we will leave a message.  We will attempt to reach you two times.  During this call, we will ask if you have developed any symptoms of COVID 19. If you develop any symptoms (ie: fever, flu-like symptoms, shortness of breath, cough etc.) before then, please call [phone].  If you test positive for Covid 19 in the 2 weeks post procedure, please call and report this information to [name].    If any biopsies were taken you will be contacted by phone or by letter within the next 1-3 weeks.  Please call us at [phone] if you have not heard about the biopsies in 3 weeks.    SIGNATURES/CONFIDENTIALITY: You and/or your care partner have signed paperwork which will be entered into your electronic medical record.  These signatures attest to the fact that that the information above on your After Visit Summary has been reviewed and is understood.  Full responsibility of the confidentiality of this discharge  information lies with you and/or your care-partner.

## 2020-09-25 NOTE — Progress Notes (Signed)
Called to room to assist during endoscopic procedure.  Patient ID and intended procedure confirmed with present staff. Received instructions for my participation in the procedure from the performing physician.  

## 2020-09-25 NOTE — Op Note (Signed)
[name] Patient Name: Nichole Smith Procedure Date: 09/25/2020 10:11 AM MRN: [id number] Endoscopist: Mauri Pole , MD Age: 64 Referring MD:  Date of Birth: 02/25/1956 Gender: Female Account #: [id number] Procedure:                Colonoscopy Indications:              High risk colon cancer surveillance: Personal                            history of colonic polyps Medicines:                Monitored Anesthesia Care Procedure:                Pre-Anesthesia Assessment:                           - Prior to the procedure, a History and Physical                            was performed, and patient medications and                            allergies were reviewed. The patient's tolerance of                            previous anesthesia was also reviewed. The risks                            and benefits of the procedure and the sedation                            options and risks were discussed with the patient.                            All questions were answered, and informed consent                            was obtained. Prior Anticoagulants: The patient has                            taken no previous anticoagulant or antiplatelet                            agents. ASA Grade Assessment: II - A patient with                            mild systemic disease. After reviewing the risks                            and benefits, the patient was deemed in                            satisfactory condition to undergo the procedure.  After obtaining informed consent, the colonoscope                            was passed under direct vision. Throughout the                            procedure, the patient's blood pressure, pulse, and                            oxygen saturations were monitored continuously. The                            Colonoscope was introduced through the anus and                            advanced to the the cecum,  identified by                            appendiceal orifice and ileocecal valve. The                            colonoscopy was performed without difficulty. The                            patient tolerated the procedure well. The quality                            of the bowel preparation was adequate. The                            ileocecal valve, appendiceal orifice, and rectum                            were photographed. Scope In: 10:23:51 AM Scope Out: 10:41:05 AM Scope Withdrawal Time: 0 hours 12 minutes 33 seconds  Total Procedure Duration: 0 hours 17 minutes 14 seconds  Findings:                 The perianal and digital rectal examinations were                            normal.                           Two sessile polyps were found in the sigmoid colon                            and proximal ascending colon. The polyps were 5 to                            10 mm in size. These polyps were removed with a                            cold snare. Resection and retrieval were complete.  Scattered small and large-mouthed diverticula were                            found in the sigmoid colon.                           Non-bleeding internal hemorrhoids were found during                            retroflexion. The hemorrhoids were medium-sized. Complications:            No immediate complications. Estimated Blood Loss:     Estimated blood loss was minimal. Impression:               - Two 5 to 10 mm polyps in the sigmoid colon and in                            the proximal ascending colon, removed with a cold                            snare. Resected and retrieved.                           - Moderate diverticulosis in the sigmoid colon.                           - Non-bleeding internal hemorrhoids. Recommendation:           - Patient has a contact number available for                            emergencies. The signs and symptoms of potential                             delayed complications were discussed with the                            patient. Return to normal activities tomorrow.                            Written discharge instructions were provided to the                            patient.                           - Resume previous diet.                           - Continue present medications.                           - Await pathology results.                           - Repeat colonoscopy in 3 - 5 years for  surveillance based on pathology results. Mauri Pole, MD 09/25/2020 10:48:13 AM This report has been signed electronically.

## 2020-09-25 NOTE — Telephone Encounter (Signed)
Left message for patient to call back and schedule Medicare Annual Wellness Visit (AWV) either virtually or in office.  WELCOME TO MCR EXAM 09/21/19; please schedule AWV-I at anytime with [facility] Nurse Health Advisor 2.  This should be a 45 minute visit.

## 2020-09-25 NOTE — Progress Notes (Signed)
Pt's states no medical or surgical changes since previsit or office visit. 

## 2020-09-27 ENCOUNTER — Telehealth: Payer: Self-pay | Admitting: *Deleted

## 2020-09-27 NOTE — Telephone Encounter (Signed)
1. Have you developed a fever since your procedure? no  2.   Have you had an respiratory symptoms (SOB or cough) since your procedure? no  3.   Have you tested positive for COVID 19 since your procedure no  4.   Have you had any family members/close contacts diagnosed with the COVID 19 since your procedure?  no   If yes to any of these questions please route to Joylene John, RN and Joella Prince, RN Follow up Call-  Call back number 09/25/2020  Post procedure Call Back phone  # [phone]  Permission to leave phone message Yes  Some recent data might be hidden     Patient questions:  Do you have a fever, pain , or abdominal swelling? No. Pain Score  0 *  Have you tolerated food without any problems? Yes.    Have you been able to return to your normal activities? Yes.    Do you have any questions about your discharge instructions: Diet   No. Medications  No. Follow up visit  No.  Do you have questions or concerns about your Care? No.  Actions: * If pain score is 4 or above: No action needed, pain <4.

## 2020-09-27 NOTE — Telephone Encounter (Signed)
No answer for post procedure call back. Left message for patient to call with questions or concerns. 

## 2020-09-30 ENCOUNTER — Other Ambulatory Visit: Payer: Self-pay | Admitting: Internal Medicine

## 2020-10-01 ENCOUNTER — Ambulatory Visit (INDEPENDENT_AMBULATORY_CARE_PROVIDER_SITE_OTHER): Payer: Medicare Other | Admitting: Physician Assistant

## 2020-10-01 ENCOUNTER — Other Ambulatory Visit: Payer: Self-pay

## 2020-10-01 ENCOUNTER — Encounter (INDEPENDENT_AMBULATORY_CARE_PROVIDER_SITE_OTHER): Payer: Self-pay | Admitting: Physician Assistant

## 2020-10-01 VITALS — BP 114/74 | HR 69 | Temp 98.5°F | Ht 59.0 in | Wt 210.0 lb

## 2020-10-01 DIAGNOSIS — R7303 Prediabetes: Secondary | ICD-10-CM | POA: Diagnosis not present

## 2020-10-01 DIAGNOSIS — I10 Essential (primary) hypertension: Secondary | ICD-10-CM | POA: Diagnosis not present

## 2020-10-01 DIAGNOSIS — Z6841 Body Mass Index (BMI) 40.0 and over, adult: Secondary | ICD-10-CM

## 2020-10-01 NOTE — Progress Notes (Signed)
Chief Complaint:   OBESITY Nichole Smith is here to discuss her progress with her obesity treatment plan along with follow-up of her obesity related diagnoses. Nichole Smith is keeping a food journal and adhering to recommended goals of 1200-1300 calories and 80 grams of protein and states she is following her eating plan approximately 100% of the time. Nichole Smith states she is walking/strengthening/swimming 20 minutes 6 times per week.  Today's visit was #: 30 Starting weight: 238 lbs Starting date: 01/12/2019 Today's weight: 210 lbs Today's date: 10/01/2020 Total lbs lost to date: 28 Total lbs lost since last in-office visit: 3  Interim History: Nichole Smith reports that [place] the [place] and did not follow the plan during that time. She is now back on plan and has lost weight in the last 10 days. She states she is excessively hungry during the day.  Subjective:   Essential hypertension. Nichole Smith is on Cardizem, Microzide, and Toprol. Blood pressure is controlled. She is followed by her PCP.  BP Readings from Last 3 Encounters:  10/01/20 114/74  09/25/20 125/60  09/05/20 117/65   Lab Results  Component Value Date   CREATININE 0.62 06/27/2020   CREATININE 0.64 01/02/2020   CREATININE 0.68 08/22/2019   Pre-diabetes. Nichole Smith has a diagnosis of prediabetes based on her elevated HgA1c and was informed this puts her at greater risk of developing diabetes. She continues to work on diet and exercise to decrease her risk of diabetes. Nichole Smith is on Trulicity. No nausea, vomiting, or diarrhea. She continues to reports excessive hunger.  Lab Results  Component Value Date   HGBA1C 5.4 06/27/2020   Lab Results  Component Value Date   INSULIN 13.7 06/27/2020   INSULIN 13.5 01/02/2020   INSULIN 21.5 08/22/2019   INSULIN 20.4 01/12/2019   Assessment/Plan:   Essential hypertension. Nichole Smith is working on healthy weight loss and exercise to improve blood pressure control. We will watch for signs of  hypotension as she continues her lifestyle modifications. She will continue her medications as directed and follow-up with her PCP as scheduled.  Pre-diabetes. Nichole Smith will continue to work on weight loss, exercise, and decreasing simple carbohydrates to help decrease the risk of diabetes. She will continue her medication as directed.   Class 3 severe obesity with serious comorbidity and body mass index (BMI) of 40.0 to 44.9 in adult, unspecified obesity type (Nichole Smith).  Nichole Smith is currently in the action stage of change. As such, her goal is to continue with weight loss efforts. She has agreed to keeping a food journal and adhering to recommended goals of 1300-1400 calories and 90 grams of protein daily.   IC will be checked at the time of her next visit.  Exercise goals: Older adults should follow the adult guidelines. When older adults cannot meet the adult guidelines, they should be as physically active as their abilities and conditions will allow.   Behavioral modification strategies: meal planning and cooking strategies and keeping healthy foods in the home.  Nichole Smith has agreed to follow-up with our clinic in 2-3 weeks. She was informed of the importance of frequent follow-up visits to maximize her success with intensive lifestyle modifications for her multiple health conditions.   Objective:   Blood pressure 114/74, pulse 69, temperature 98.5 F (36.9 C), height 4\' 11"  (1.499 m), weight 210 lb (95.3 kg), last menstrual period 02/12/2011, SpO2 96 %. Body mass index is 42.41 kg/m.  General: Cooperative, alert, well developed, in no acute distress. HEENT: Conjunctivae and  lids unremarkable. Cardiovascular: Regular rhythm.  Lungs: Normal work of breathing. Neurologic: No focal deficits.   Lab Results  Component Value Date   CREATININE 0.62 06/27/2020   BUN 21 06/27/2020   NA 140 06/27/2020   K 4.5 06/27/2020   CL 101 06/27/2020   CO2 26 06/27/2020   Lab Results  Component Value Date     ALT 28 06/27/2020   AST 27 06/27/2020   ALKPHOS 74 06/27/2020   BILITOT 0.4 06/27/2020   Lab Results  Component Value Date   HGBA1C 5.4 06/27/2020   HGBA1C 5.5 01/02/2020   HGBA1C 5.6 08/22/2019   HGBA1C 5.9 (H) 01/12/2019   HGBA1C 5.9 06/07/2018   Lab Results  Component Value Date   INSULIN 13.7 06/27/2020   INSULIN 13.5 01/02/2020   INSULIN 21.5 08/22/2019   INSULIN 20.4 01/12/2019   Lab Results  Component Value Date   TSH 0.98 07/31/2020   Lab Results  Component Value Date   CHOL 117 01/02/2020   HDL 53 01/02/2020   LDLCALC 51 01/02/2020   TRIG 57 01/02/2020   CHOLHDL 2 06/07/2018   Lab Results  Component Value Date   WBC 8.1 06/27/2020   HGB 15.9 06/27/2020   HCT 47.9 (H) 06/27/2020   MCV 94 06/27/2020   PLT 243 06/27/2020   No results found for: IRON, TIBC, FERRITIN  Obesity Behavioral Intervention:   Approximately 15 minutes were spent on the discussion below.  ASK: We discussed the diagnosis of obesity with Nichole Smith today and Nichole Smith agreed to give [name] permission to discuss obesity behavioral modification therapy today.  ASSESS: Nichole Smith has the diagnosis of obesity and her BMI today is 42.4. Nichole Smith is in the action stage of change.   ADVISE: Nichole Smith was educated on the multiple health risks of obesity as well as the benefit of weight loss to improve her health. She was advised of the need for long term treatment and the importance of lifestyle modifications to improve her current health and to decrease her risk of future health problems.  AGREE: Multiple dietary modification options and treatment options were discussed and Nichole Smith agreed to follow the recommendations documented in the above note.  ARRANGE: Nichole Smith was educated on the importance of frequent visits to treat obesity as outlined per CMS and USPSTF guidelines and agreed to schedule her next follow up appointment today.  Attestation Statements:   Reviewed by clinician on day of visit: allergies,  medications, problem list, medical history, surgical history, family history, social history, and previous encounter notes.  Nichole Smith, am acting as transcriptionist for Abby Potash, PA-C   I have reviewed the above documentation for accuracy and completeness, and I agree with the above. Abby Potash, PA-C

## 2020-10-02 ENCOUNTER — Ambulatory Visit (INDEPENDENT_AMBULATORY_CARE_PROVIDER_SITE_OTHER): Payer: Medicare Other

## 2020-10-02 DIAGNOSIS — Z0000 Encounter for general adult medical examination without abnormal findings: Secondary | ICD-10-CM | POA: Diagnosis not present

## 2020-10-02 DIAGNOSIS — Z780 Asymptomatic menopausal state: Secondary | ICD-10-CM

## 2020-10-02 NOTE — Patient Instructions (Signed)
Nichole Smith , Thank you for taking time to come for your Medicare Wellness Visit. I appreciate your ongoing commitment to your health goals. Please review the following plan we discussed and let me know if I can assist you in the future.   Screening recommendations/referrals: Colonoscopy: Up to date, next due 09/25/2025 Mammogram: Up to date, next due 08/21/2021 Bone Density: Currently due for a repeat Bone Density, orders placed this visit Recommended yearly ophthalmology/optometry visit for glaucoma screening and checkup Recommended yearly dental visit for hygiene and checkup  Vaccinations: Influenza vaccine: Up to date, next due fall 2022 Pneumococcal vaccine: Completed series Tdap vaccine: Up to date, next due 01/31/2024 Shingles vaccine: Completed series     Advanced directives: Please bring copies of your advanced medical directives into the office so that we may scan them into your chart.  Conditions/risks identified: None   Next appointment: 10/14/2021 @ 9:45 am with [facility] for Medicare Wellness visit    Preventive Care 30 Years and Older, Female Preventive care refers to lifestyle choices and visits with your health care provider that can promote health and wellness. What does preventive care include?  A yearly physical exam. This is also called an annual well check.  Dental exams once or twice a year.  Routine eye exams. Ask your health care provider how often you should have your eyes checked.  Personal lifestyle choices, including:  Daily care of your teeth and gums.  Regular physical activity.  Eating a healthy diet.  Avoiding tobacco and drug use.  Limiting alcohol use.  Practicing safe sex.  Taking low-dose aspirin every day.  Taking vitamin and mineral supplements as recommended by your health care provider. What happens during an annual well check? The services and screenings done by your health care provider during your  annual well check will depend on your age, overall health, lifestyle risk factors, and family history of disease. Counseling  Your health care provider may ask you questions about your:  Alcohol use.  Tobacco use.  Drug use.  Emotional well-being.  Home and relationship well-being.  Sexual activity.  Eating habits.  History of falls.  Memory and ability to understand (cognition).  Work and work Statistician.  Reproductive health. Screening  You may have the following tests or measurements:  Height, weight, and BMI.  Blood pressure.  Lipid and cholesterol levels. These may be checked every 5 years, or more frequently if you are over 65 years old.  Skin check.  Lung cancer screening. You may have this screening every year starting at age 15 if you have a 30-pack-year history of smoking and currently smoke or have quit within the past 15 years.  Fecal occult blood test (FOBT) of the stool. You may have this test every year starting at age 65.  Flexible sigmoidoscopy or colonoscopy. You may have a sigmoidoscopy every 5 years or a colonoscopy every 10 years starting at age 72.  Hepatitis C blood test.  Hepatitis B blood test.  Sexually transmitted disease (STD) testing.  Diabetes screening. This is done by checking your blood sugar (glucose) after you have not eaten for a while (fasting). You may have this done every 1-3 years.  Bone density scan. This is done to screen for osteoporosis. You may have this done starting at age 79.  Mammogram. This may be done every 1-2 years. Talk to your health care provider about how often you should have regular mammograms. Talk with your health care provider about your  test results, treatment options, and if necessary, the need for more tests. Vaccines  Your health care provider may recommend certain vaccines, such as:  Influenza vaccine. This is recommended every year.  Tetanus, diphtheria, and acellular pertussis (Tdap, Td)  vaccine. You may need a Td booster every 10 years.  Zoster vaccine. You may need this after age 56.  Pneumococcal 13-valent conjugate (PCV13) vaccine. One dose is recommended after age 38.  Pneumococcal polysaccharide (PPSV23) vaccine. One dose is recommended after age 60. Talk to your health care provider about which screenings and vaccines you need and how often you need them. This information is not intended to replace advice given to you by your health care provider. Make sure you discuss any questions you have with your health care provider. Document Released: 12/14/2015 Document Revised: 08/06/2016 Document Reviewed: 09/18/2015 Elsevier Interactive Patient Education  2017 [place] Prevention in the Home Falls can cause injuries. They can happen to people of all ages. There are many things you can do to make your home safe and to help prevent falls. What can I do on the outside of my home?  Regularly fix the edges of walkways and driveways and fix any cracks.  Remove anything that might make you trip as you walk through a door, such as a raised step or threshold.  Trim any bushes or trees on the path to your home.  Use bright outdoor lighting.  Clear any walking paths of anything that might make someone trip, such as rocks or tools.  Regularly check to see if handrails are loose or broken. Make sure that both sides of any steps have handrails.  Any raised decks and porches should have guardrails on the edges.  Have any leaves, snow, or ice cleared regularly.  Use sand or salt on walking paths during winter.  Clean up any spills in your garage right away. This includes oil or grease spills. What can I do in the bathroom?  Use night lights.  Install grab bars by the toilet and in the tub and shower. Do not use towel bars as grab bars.  Use non-skid mats or decals in the tub or shower.  If you need to sit down in the shower, use a plastic, non-slip  stool.  Keep the floor dry. Clean up any water that spills on the floor as soon as it happens.  Remove soap buildup in the tub or shower regularly.  Attach bath mats securely with double-sided non-slip rug tape.  Do not have throw rugs and other things on the floor that can make you trip. What can I do in the bedroom?  Use night lights.  Make sure that you have a light by your bed that is easy to reach.  Do not use any sheets or blankets that are too big for your bed. They should not hang down onto the floor.  Have a firm chair that has side arms. You can use this for support while you get dressed.  Do not have throw rugs and other things on the floor that can make you trip. What can I do in the kitchen?  Clean up any spills right away.  Avoid walking on wet floors.  Keep items that you use a lot in easy-to-reach places.  If you need to reach something above you, use a strong step stool that has a grab bar.  Keep electrical cords out of the way.  Do not use floor polish or wax that  makes floors slippery. If you must use wax, use non-skid floor wax.  Do not have throw rugs and other things on the floor that can make you trip. What can I do with my stairs?  Do not leave any items on the stairs.  Make sure that there are handrails on both sides of the stairs and use them. Fix handrails that are broken or loose. Make sure that handrails are as long as the stairways.  Check any carpeting to make sure that it is firmly attached to the stairs. Fix any carpet that is loose or worn.  Avoid having throw rugs at the top or bottom of the stairs. If you do have throw rugs, attach them to the floor with carpet tape.  Make sure that you have a light switch at the top of the stairs and the bottom of the stairs. If you do not have them, ask someone to add them for you. What else can I do to help prevent falls?  Wear shoes that:  Do not have high heels.  Have rubber bottoms.  Are  comfortable and fit you well.  Are closed at the toe. Do not wear sandals.  If you use a stepladder:  Make sure that it is fully opened. Do not climb a closed stepladder.  Make sure that both sides of the stepladder are locked into place.  Ask someone to hold it for you, if possible.  Clearly mark and make sure that you can see:  Any grab bars or handrails.  First and last steps.  Where the edge of each step is.  Use tools that help you move around (mobility aids) if they are needed. These include:  Canes.  Walkers.  Scooters.  Crutches.  Turn on the lights when you go into a dark area. Replace any light bulbs as soon as they burn out.  Set up your furniture so you have a clear path. Avoid moving your furniture around.  If any of your floors are uneven, fix them.  If there are any pets around you, be aware of where they are.  Review your medicines with your doctor. Some medicines can make you feel dizzy. This can increase your chance of falling. Ask your doctor what other things that you can do to help prevent falls. This information is not intended to replace advice given to you by your health care provider. Make sure you discuss any questions you have with your health care provider. Document Released: 09/13/2009 Document Revised: 04/24/2016 Document Reviewed: 12/22/2014 Elsevier Interactive Patient Education  2017 [employer].

## 2020-10-02 NOTE — Progress Notes (Signed)
Subjective:   Nichole Smith is a 64 y.o. female who presents for Medicare Annual (Subsequent) preventive examination.  I connected with Nichole Smith today by telephone and verified that I am speaking with the correct person using two identifiers. Location patient: home Location provider: work Persons participating in the virtual visit: patient, provider.   I discussed the limitations, risks, security and privacy concerns of performing an evaluation and management service by telephone and the availability of in person appointments. I also discussed with the patient that there may be a patient responsible charge related to this service. The patient expressed understanding and verbally consented to this telephonic visit.    Interactive audio and video telecommunications were attempted between this provider and patient, however failed, due to patient having technical difficulties OR patient did not have access to video capability.  We continued and completed visit with audio only.      Review of Systems    N/A Cardiac Risk Factors include: advanced age (>30men, >15 women);hypertension;dyslipidemia     Objective:    Today's Vitals   10/02/20 0949  PainSc: 2    There is no height or weight on file to calculate BMI.  Advanced Directives 10/02/2020 12/24/2017 06/01/2016 08/27/2015 07/07/2014 07/11/2013  Does Patient Have a Medical Advance Directive? Yes No Yes No;Yes Patient has advance directive, copy not in chart Patient has advance directive, copy not in chart  Type of Advance Directive [place];Living will - [place];Living will Living will;Healthcare Power of Attorney Living will -  Does patient want to make changes to medical advance directive? No - Patient declined - No - Patient declined - - -  Copy of [place] in Chart? No - copy requested - No - copy requested No - copy requested - -  Would patient like information on  creating a medical advance directive? - No - Patient declined - - - -  Pre-existing out of facility DNR order (yellow form or pink MOST form) - - - - No No    Current Medications (verified) Outpatient Encounter Medications as of 10/02/2020  Medication Sig  . aspirin EC 81 MG tablet Take 81 mg by mouth at bedtime.  [name] atorvastatin (LIPITOR) 40 MG tablet Take 1 tablet (40 mg total) by mouth daily.  [name] augmented betamethasone dipropionate (DIPROLENE-AF) 0.05 % ointment Apply topically.  [name] CALCIUM PO Take 1 tablet by mouth at bedtime.   . Cholecalciferol (VITAMIN D3) 50 MCG (2000 UT) TABS Take by mouth.  . diltiazem (CARDIZEM CD) 180 MG 24 hr capsule Take 1 capsule (180 mg total) by mouth daily.  . Dulaglutide (TRULICITY) 1.5 FX/8.3AN SOPN Inject 1.5 mg into the skin once a week.  . estradiol (ESTRACE VAGINAL) 0.1 MG/GM vaginal cream INSERT 1 GM VAGINALLY TWICE WEEKLY  . hydrochlorothiazide (MICROZIDE) 12.5 MG capsule TAKE 1 CAPSULE DAILY.   SCHEDULE ANNUAL VISIT  . metoprolol succinate (TOPROL-XL) 50 MG 24 hr tablet Take 50 mg by mouth daily. Take with or immediately following a meal.  . Multiple Vitamins-Minerals (PRESERVISION AREDS 2 PO) Take 1 tablet by mouth 2 (two) times daily.  . naproxen sodium (ALEVE) 220 MG tablet Take 220 mg by mouth 2 (two) times daily as needed (for pain.).  [name] SYNTHROID 137 MCG tablet TAKE 1 TABLET DAILY  . TURMERIC PO Take by mouth daily.  . methocarbamol (ROBAXIN) 500 MG tablet Take 1 tablet (500 mg total) by mouth 3 (three) times daily. (Patient not  taking: Reported on 10/02/2020)   No facility-administered encounter medications on file as of 10/02/2020.    Allergies (verified) Neomycin and Ace inhibitors   History: Past Medical History:  Diagnosis Date  . Arthritis    knees, back  . Bursitis    hips - almost gone per pt   . Cancer ([place])   . Carcinoid tumor of lung 2012   right found incidentally on chest xray eval for r/o vasxculitis   . Carpal tunnel  syndrome, bilateral    bilat but surgery to correct   . Coronary artery calcification 07/23/2016  . Double vision    history, no current problem  . Dysrhythmia    irregular occassional, no current problem  . Frequent UTI   . HNP (herniated nucleus pulposus), cervical 01/22/2011   C6-C7  Has seen dr Vertell Limber for this and Dr Tonita Cong  Positional numbness in hands no weakness    . Hypertension   . Hypothyroidism   . Infertility, female   . 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 Obstructive sleep apnea 07/27/2014   NPSG 07/2014:  AHI 85/hr, optimal cpap 10cm.  Download 08/2014:  Good compliance, breakthru apnea on 10cm.  Changed to auto 5-12cm >> good control of AHI on f/u download ONO on CPAP:      . Osteoarthritis   . PCOS (polycystic ovarian syndrome)   . Sleep apnea    wears cpap   . SUI (stress urinary incontinence, female)    h/o   Past Surgical History:  Procedure Laterality Date  . ANTERIOR CERVICAL DECOMP/DISCECTOMY FUSION  12/24/2017   Procedure: Cervical five-six Cervical six-seven Anterior cervical decompression/discectomy/fusion;  Surgeon: Erline Levine, MD;  Location: [facility];  Service: Neurosurgery;;  . Wilmon Pali RELEASE Left 12/24/2017   Procedure: LEFT CARPAL TUNNEL RELEASE;  Surgeon: Erline Levine, MD;  Location: [facility];  Service: Neurosurgery;  Laterality: Left;  . CESAREAN SECTION     x 1 - twins  . COLONOSCOPY    . DILATATION & CURRETTAGE/HYSTEROSCOPY WITH RESECTOCOPE N/A 07/18/2013   Procedure: DILATATION & CURETTAGE/HYSTEROSCOPY WITH RESECTOCOPE;  Surgeon: Lyman Speller, MD;  Location: [facility];  Service: Gynecology;  Laterality: N/A;  mass resection  . Lung tumor removed     Rt lung carcinoid  . MOUTH SURGERY     pre-cancerous ulcer removed  . POLYPECTOMY    . SHOULDER OPEN ROTATOR CUFF REPAIR  07/20/2010   Right  . WISDOM TOOTH EXTRACTION     Family History  Problem Relation Age of Onset  . Stroke Mother        died age 19  . Hypertension Mother     . Sudden death Mother   . Alcoholism Mother   . Coronary artery disease Father        died age 25  . Hypertension Father   . Liver disease Father   . Alcohol abuse Father   . Obesity Father   . Parkinson's disease Brother   . Diabetes Brother   . Hypertension Brother   . Hypertension Maternal Uncle   . Colon cancer Neg Hx   . Rectal cancer Neg Hx   . Stomach cancer Neg Hx   . Colon polyps Neg Hx   . Esophageal cancer Neg Hx    Social History   Socioeconomic History  . Marital status: Married    Spouse name: [name]  . Number of children: 2  . Years of education: Not on file  . Highest education level: Not on  file  Occupational History  . 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 Smoking status: Never Smoker  . Smokeless tobacco: Never Used  Vaping Use  . Vaping Use: Never used  Substance and Sexual Activity  . Alcohol use: Not Currently  . Drug use: No  . Sexual activity: Not Currently    Partners: Male    Birth control/protection: Post-menopausal  Other Topics Concern  . Not on file  Social History Narrative   hhof 2    2 Children at college and  beyond      [name]    Married    Retired age 5 2015   Recently moved taking care of grandchildren during the week.   Social Determinants of Health   Financial Resource Strain: Low Risk   . Difficulty of Paying Living Expenses: Not hard at all  Food Insecurity: No Food Insecurity  . Worried About Charity fundraiser in the Last Year: Never true  . Ran Out of Food in the Last Year: Never true  Transportation Needs: No Transportation Needs  . Lack of Transportation (Medical): No  . Lack of Transportation (Non-Medical): No  Physical Activity: Sufficiently Active  . Days of Exercise per Week: 6 days  . Minutes of Exercise per Session: 30 min  Stress: No Stress Concern Present  . Feeling of Stress : Not at all  Social Connections: Socially Integrated  . Frequency of  Communication with Friends and Family: More than three times a week  . Frequency of Social Gatherings with Friends and Family: More than three times a week  . Attends Religious Services: More than 4 times per year  . Active Member of Clubs or Organizations: Yes  . Attends Archivist Meetings: More than 4 times per year  . Marital Status: Married    Tobacco Counseling Counseling given: Not Answered   Clinical Intake:  Pre-visit preparation completed: Yes  Pain : 0-10 Pain Score: 2  Pain Type: Chronic pain Pain Location: Back (hip) Pain Orientation: Lower Pain Descriptors / Indicators: Aching Pain Onset: More than a month ago Pain Frequency: Intermittent Pain Relieving Factors: Exercise  Pain Relieving Factors: Exercise  Nutritional Risks: None Diabetes: No  How often do you need to have someone help you when you read instructions, pamphlets, or other written materials from your doctor or pharmacy?: 1 - Never What is the last grade level you completed in school?: College  Diabetic?No  Interpreter Needed?: No  Information entered by :: [place] of Daily Living In your present state of health, do you have any difficulty performing the following activities: 10/02/2020  Hearing? N  Vision? N  Difficulty concentrating or making decisions? N  Walking or climbing stairs? N  Dressing or bathing? N  Doing errands, shopping? N  Preparing Food and eating ? N  Using the Toilet? N  In the past six months, have you accidently leaked urine? Y  Comment Has issues with bladder leakage  Do you have problems with loss of bowel control? N  Managing your Medications? N  Managing your Finances? N  Housekeeping or managing your Housekeeping? N  Some recent data might be hidden    Patient Care Team: Panosh, Standley Brooking, MD as PCP - General Sueanne Margarita, MD as PCP - Cardiology (Cardiology) Jari Pigg, MD (Dermatology) Megan Salon, MD as Attending  Physician (Gynecology) Earlie Server, MD as Attending Physician (Orthopedic Surgery)  Indicate  any recent Medical Services you may have received from other than [facility] providers in the past year (date may be approximate).     Assessment:   This is a routine wellness examination for Nichole Smith.  Hearing/Vision screen  Hearing Screening   125Hz  250Hz  500Hz  1000Hz  2000Hz  3000Hz  4000Hz  6000Hz  8000Hz   Right ear:           Left ear:           Vision Screening Comments: Patient states gets eyes checked 3x per year due to Macular Degeneration   Dietary issues and exercise activities discussed: Current Exercise Habits: Home exercise routine, Time (Minutes): 30, Frequency (Times/Week): 6, Weekly Exercise (Minutes/Week): 180, Intensity: Mild, Exercise limited by: None identified  Goals    . Patient Stated     I will continue to exercise 6 days per week for 30 minutes     . Weight (lb) < 200 lb (90.7 kg)      Depression Screen PHQ 2/9 Scores 10/02/2020 09/21/2019 01/12/2019 04/21/2017 05/27/2015 02/22/2014  PHQ - 2 Score 0 0 4 0 0 0  PHQ- 9 Score 0 - 14 - - -  Exception Documentation - - Medical reason - - -    Fall Risk Fall Risk  10/02/2020 09/21/2019 04/21/2017  Falls in the past year? 0 0 No  Number falls in past yr: 0 0 -  Injury with Fall? 0 0 -  Risk for fall due to : No Fall Risks - -  Follow up Falls evaluation completed;Falls prevention discussed Falls evaluation completed -    Any stairs in or around the home? No  If so, are there any without handrails? No  Home free of loose throw rugs in walkways, pet beds, electrical cords, etc? Yes  Adequate lighting in your home to reduce risk of falls? Yes   ASSISTIVE DEVICES UTILIZED TO PREVENT FALLS:  Life alert? No  Use of a cane, walker or w/c? No  Grab bars in the bathroom? Yes  Shower chair or bench in shower? Yes  Elevated toilet seat or a handicapped toilet? No     Cognitive Function:  Cognition within normal limits based on  direct observation. Screening not indicated      Immunizations Immunization History  Administered Date(s) Administered  . Fluad Quad(high Dose 65+) 09/21/2019, 09/10/2020  . Influenza Split 10/17/2011, 08/12/2012, 08/01/2013  . Influenza, Quadrivalent, Recombinant, Inj, Pf 08/12/2018  . Influenza,inj,Quad PF,6+ Mos 09/04/2014, 08/27/2015, 08/01/2016, 08/10/2017  . Influenza-Unspecified 08/30/2018  . Moderna SARS-COVID-2 Vaccination 12/30/2019, 01/27/2020, 09/22/2020  . Pneumococcal Conjugate-13 10/05/2019  . Pneumococcal Polysaccharide-23 09/10/2020  . Td 12/01/1997, 10/16/2008  . Tdap 01/30/2014  . Zoster 05/25/2012  . Zoster Recombinat (Shingrix) 01/12/2017, 04/02/2017    TDAP status: Up to date Flu Vaccine status: Up to date Pneumococcal vaccine status: Up to date Covid-19 vaccine status: Completed vaccines  Qualifies for Shingles Vaccine? Yes   Zostavax completed Yes   Shingrix Completed?: Yes  Screening Tests Health Maintenance  Topic Date Due  . URINE MICROALBUMIN  Never done  . MAMMOGRAM  08/21/2022  . TETANUS/TDAP  01/31/2024  . COLONOSCOPY  09/25/2025  . INFLUENZA VACCINE  Completed  . DEXA SCAN  Completed  . COVID-19 Vaccine  Completed  . Hepatitis C Screening  Completed  . PNA vac Low Risk Adult  Completed    Health Maintenance  Health Maintenance Due  Topic Date Due  . URINE MICROALBUMIN  Never done    Colorectal cancer screening: Completed 09/25/2020. Repeat every  5 years Mammogram status: Completed 08/21/2020. Repeat every year Bone Density status: Completed 07/26/2018. Results reflect: Bone density results: OSTEOPENIA. Repeat every 2 years.  Lung Cancer Screening: (Low Dose CT Chest recommended if Age 1-80 years, 30 pack-year currently smoking OR have quit w/in 15years.) does not qualify.   Lung Cancer Screening Referral: N/A   Additional Screening:  Hepatitis C Screening: does qualify; Completed 0405/2017  Vision Screening: Recommended  annual ophthalmology exams for early detection of glaucoma and other disorders of the eye. Is the patient up to date with their annual eye exam?  Yes  Who is the provider or what is the name of the office in which the patient attends annual eye exams? Dr. Jerline Pain, Dr. Robyne Askew specialist) If pt is not established with a provider, would they like to be referred to a provider to establish care? No .   Dental Screening: Recommended annual dental exams for proper oral hygiene  Community Resource Referral / Chronic Care Management: CRR required this visit?  No   CCM required this visit?  No      Plan:     I have personally reviewed and noted the following in the patient's chart:   . Medical and social history . Use of alcohol, tobacco or illicit drugs  . Current medications and supplements . Functional ability and status . Nutritional status . Physical activity . Advanced directives . List of other physicians . Hospitalizations, surgeries, and ER visits in previous 12 months . Vitals . Screenings to include cognitive, depression, and falls . Referrals and appointments  In addition, I have reviewed and discussed with patient certain preventive protocols, quality metrics, and best practice recommendations. A written personalized care plan for preventive services as well as general preventive health recommendations were provided to patient.     Ofilia Neas, LPN   01/05/8526   Nurse Notes: None

## 2020-10-15 ENCOUNTER — Encounter: Payer: Self-pay | Admitting: Gastroenterology

## 2020-10-22 ENCOUNTER — Encounter (INDEPENDENT_AMBULATORY_CARE_PROVIDER_SITE_OTHER): Payer: Self-pay | Admitting: Physician Assistant

## 2020-10-22 ENCOUNTER — Other Ambulatory Visit: Payer: Self-pay

## 2020-10-22 ENCOUNTER — Ambulatory Visit (INDEPENDENT_AMBULATORY_CARE_PROVIDER_SITE_OTHER): Payer: Medicare Other | Admitting: Physician Assistant

## 2020-10-22 VITALS — BP 122/73 | HR 62 | Temp 97.8°F | Ht 59.0 in | Wt 208.0 lb

## 2020-10-22 DIAGNOSIS — Z6841 Body Mass Index (BMI) 40.0 and over, adult: Secondary | ICD-10-CM | POA: Diagnosis not present

## 2020-10-22 DIAGNOSIS — R0602 Shortness of breath: Secondary | ICD-10-CM | POA: Diagnosis not present

## 2020-10-22 DIAGNOSIS — E559 Vitamin D deficiency, unspecified: Secondary | ICD-10-CM | POA: Diagnosis not present

## 2020-10-22 DIAGNOSIS — E8881 Metabolic syndrome: Secondary | ICD-10-CM

## 2020-10-22 DIAGNOSIS — R7303 Prediabetes: Secondary | ICD-10-CM

## 2020-10-22 DIAGNOSIS — E119 Type 2 diabetes mellitus without complications: Secondary | ICD-10-CM | POA: Diagnosis not present

## 2020-10-22 MED ORDER — TRULICITY 1.5 MG/0.5ML ~~LOC~~ SOAJ: 1.5000 mg | SUBCUTANEOUS | 0 refills | Status: DC

## 2020-10-22 NOTE — Telephone Encounter (Signed)
Last OV with [name] 

## 2020-10-23 ENCOUNTER — Other Ambulatory Visit (INDEPENDENT_AMBULATORY_CARE_PROVIDER_SITE_OTHER): Payer: Self-pay | Admitting: Family Medicine

## 2020-10-23 DIAGNOSIS — E119 Type 2 diabetes mellitus without complications: Secondary | ICD-10-CM

## 2020-10-23 NOTE — Telephone Encounter (Signed)
This patient was last seen by Abby Potash, PA-C, and currently has an upcoming appt scheduled on 11/13/20 with her.

## 2020-10-23 NOTE — Telephone Encounter (Signed)
Nichole Smith

## 2020-10-23 NOTE — Telephone Encounter (Signed)
Yes to sending it like she needs please

## 2020-10-23 NOTE — Progress Notes (Addendum)
Chief Complaint:   OBESITY Nichole Smith is here to discuss her progress with her obesity treatment plan along with follow-up of her obesity related diagnoses. Nichole Smith is keeping a food journal and adhering to recommended goals of 1300-1400 calories and 90 grams of protein and states she is following her eating plan approximately 90% of the time. Nichole Smith states she is walking/strength training/swimming 45 minutes 7 times per week.  Today's visit was #: 78 Starting weight: 238 lbs Starting date: 01/12/2019 Today's weight: 208 lbs Today's date: 10/22/2020 Total lbs lost to date: 30 Total lbs lost since last in-office visit: 2  Interim History: Nichole Smith reports her average calories were about 1500 the past 2 weeks with average protein around 100 grams daily. She continues to be hungry throughout the day.  Subjective:   Prediabetes. Nichole Smith is on Trulicity 1.5 mg and continues to endorse hunger.   Lab Results  Component Value Date   HGBA1C 5.4 06/27/2020   HGBA1C 5.5 01/02/2020   HGBA1C 5.6 08/22/2019   Lab Results  Component Value Date   LDLCALC 51 01/02/2020   CREATININE 0.62 06/27/2020   Lab Results  Component Value Date   INSULIN 13.7 06/27/2020   INSULIN 13.5 01/02/2020   INSULIN 21.5 08/22/2019   INSULIN 20.4 01/12/2019   Vitamin D deficiency. Nichole Smith is on Vitamin D 2,000 units daily. Last Vitamin D was at goal at 51.9.   Ref. Range 06/27/2020 11:23  Vitamin D, 25-Hydroxy Latest Ref Range: 30.0 - 100.0 ng/mL 51.9   SOB (shortness of breath). Nichole Smith reports shortness of breath with exertion. No chest pain or lightheadedness.  Assessment/Plan:   Prediabetes. Refill was given for Dulaglutide (TRULICITY) 1.5 QM/0.8QP SOPN  #3 with 0 refills.  Vitamin D deficiency. Low Vitamin D level contributes to fatigue and are associated with obesity, breast, and colon cancer. She agrees to continue to take OTC Vitamin D as directed and will follow-up for routine testing of Vitamin D,  at least 2-3 times per year to avoid over-replacement.  SOB (shortness of breath). IC will be checked today.  Class 3 severe obesity with serious comorbidity and body mass index (BMI) of 40.0 to 44.9 in adult, unspecified obesity type (Nichole Smith).  Nichole Smith is currently in the action stage of change. As such, her goal is to continue with weight loss efforts. She has agreed to keeping a food journal and adhering to recommended goals of 1400-1500 calories and 95 grams of protein daily.   IC will be checked at her next office visit.  Exercise goals: Older adults should follow the adult guidelines. When older adults cannot meet the adult guidelines, they should be as physically active as their abilities and conditions will allow.   Behavioral modification strategies: keeping healthy foods in the home and planning for success.  Nichole Smith has agreed to follow-up with our clinic in 3 weeks. She was informed of the importance of frequent follow-up visits to maximize her success with intensive lifestyle modifications for her multiple health conditions.   Objective:   Blood pressure 122/73, pulse 62, temperature 97.8 F (36.6 C), height 4\' 11"  (1.499 m), weight 208 lb (94.3 kg), last menstrual period 02/12/2011, SpO2 97 %. Body mass index is 42.01 kg/m.  General: Cooperative, alert, well developed, in no acute distress. HEENT: Conjunctivae and lids unremarkable. Cardiovascular: Regular rhythm.  Lungs: Normal work of breathing. Neurologic: No focal deficits.   Lab Results  Component Value Date   CREATININE 0.62 06/27/2020   BUN 21  06/27/2020   NA 140 06/27/2020   K 4.5 06/27/2020   CL 101 06/27/2020   CO2 26 06/27/2020   Lab Results  Component Value Date   ALT 28 06/27/2020   AST 27 06/27/2020   ALKPHOS 74 06/27/2020   BILITOT 0.4 06/27/2020   Lab Results  Component Value Date   HGBA1C 5.4 06/27/2020   HGBA1C 5.5 01/02/2020   HGBA1C 5.6 08/22/2019   HGBA1C 5.9 (H) 01/12/2019   HGBA1C 5.9  06/07/2018   Lab Results  Component Value Date   INSULIN 13.7 06/27/2020   INSULIN 13.5 01/02/2020   INSULIN 21.5 08/22/2019   INSULIN 20.4 01/12/2019   Lab Results  Component Value Date   TSH 0.98 07/31/2020   Lab Results  Component Value Date   CHOL 117 01/02/2020   HDL 53 01/02/2020   LDLCALC 51 01/02/2020   TRIG 57 01/02/2020   CHOLHDL 2 06/07/2018   Lab Results  Component Value Date   WBC 8.1 06/27/2020   HGB 15.9 06/27/2020   HCT 47.9 (H) 06/27/2020   MCV 94 06/27/2020   PLT 243 06/27/2020   No results found for: IRON, TIBC, FERRITIN  Obesity Behavioral Intervention:   Approximately 15 minutes were spent on the discussion below.  ASK: We discussed the diagnosis of obesity with [name] today and [name] agreed to give [name] permission to discuss obesity behavioral modification therapy today.  ASSESS: [name] has the diagnosis of obesity and her BMI today is 42.1. [name] is in the action stage of change.   ADVISE: [name] was educated on the multiple health risks of obesity as well as the benefit of weight loss to improve her health. She was advised of the need for long term treatment and the importance of lifestyle modifications to improve her current health and to decrease her risk of future health problems.  AGREE: Multiple dietary modification options and treatment options were discussed and [name] agreed to follow the recommendations documented in the above note.  ARRANGE: [name] was educated on the importance of frequent visits to treat obesity as outlined per CMS and USPSTF guidelines and agreed to schedule her next follow up appointment today.  Attestation Statements:   Reviewed by clinician on day of visit: allergies, medications, problem list, medical history, surgical history, family history, social history, and previous encounter notes.  [name], am acting as transcriptionist for Abby Potash, PA-C   I have reviewed the above documentation for  accuracy and completeness, and I agree with the above. Abby Potash, PA-C

## 2020-10-23 NOTE — Telephone Encounter (Signed)
Yes Mrs. [name] and thank you.

## 2020-10-28 MED ORDER — TRULICITY 1.5 MG/0.5ML ~~LOC~~ SOAJ: 1.5000 mg | SUBCUTANEOUS | 0 refills | Status: DC

## 2020-10-30 ENCOUNTER — Other Ambulatory Visit: Payer: Self-pay

## 2020-10-30 ENCOUNTER — Ambulatory Visit (INDEPENDENT_AMBULATORY_CARE_PROVIDER_SITE_OTHER): Payer: Medicare Other | Admitting: Orthotics

## 2020-10-30 DIAGNOSIS — M2141 Flat foot [pes planus] (acquired), right foot: Secondary | ICD-10-CM | POA: Diagnosis not present

## 2020-10-30 DIAGNOSIS — M2142 Flat foot [pes planus] (acquired), left foot: Secondary | ICD-10-CM | POA: Diagnosis not present

## 2020-10-30 DIAGNOSIS — M19072 Primary osteoarthritis, left ankle and foot: Secondary | ICD-10-CM

## 2020-10-30 DIAGNOSIS — M205X9 Other deformities of toe(s) (acquired), unspecified foot: Secondary | ICD-10-CM

## 2020-10-30 DIAGNOSIS — M19071 Primary osteoarthritis, right ankle and foot: Secondary | ICD-10-CM | POA: Diagnosis not present

## 2020-10-30 NOTE — Progress Notes (Signed)
Cast for cmfo to address hallux limitus: plan on reverse mortons and hug arc.

## 2020-11-13 ENCOUNTER — Ambulatory Visit (INDEPENDENT_AMBULATORY_CARE_PROVIDER_SITE_OTHER): Payer: Medicare Other | Admitting: Physician Assistant

## 2020-11-13 ENCOUNTER — Other Ambulatory Visit: Payer: Self-pay

## 2020-11-13 ENCOUNTER — Encounter (INDEPENDENT_AMBULATORY_CARE_PROVIDER_SITE_OTHER): Payer: Self-pay | Admitting: Physician Assistant

## 2020-11-13 VITALS — BP 137/74 | HR 67 | Temp 98.3°F | Ht 59.0 in | Wt 209.0 lb

## 2020-11-13 DIAGNOSIS — E785 Hyperlipidemia, unspecified: Secondary | ICD-10-CM | POA: Diagnosis not present

## 2020-11-13 DIAGNOSIS — I10 Essential (primary) hypertension: Secondary | ICD-10-CM

## 2020-11-13 DIAGNOSIS — E1169 Type 2 diabetes mellitus with other specified complication: Secondary | ICD-10-CM | POA: Diagnosis not present

## 2020-11-13 DIAGNOSIS — R7303 Prediabetes: Secondary | ICD-10-CM

## 2020-11-13 DIAGNOSIS — Z6841 Body Mass Index (BMI) 40.0 and over, adult: Secondary | ICD-10-CM | POA: Diagnosis not present

## 2020-11-14 NOTE — Progress Notes (Addendum)
Chief Complaint:   OBESITY SHAKENYA STONEBERG is here to discuss her progress with her obesity treatment plan along with follow-up of her obesity related diagnoses. Lular is keeping a food journal and adhering to recommended goals of 1400-1500 calories and 95 grams of protein and states she is following her eating plan approximately 50% of the time. Jane states she is walking/swimming/strength training 25 minutes 7 times per week.  Today's visit was #: 73 Starting weight: 238 lbs Starting date: 01/12/2019 Today's weight: 209 lbs Today's date: 11/13/2020 Total lbs lost to date: 29 Total lbs lost since last in-office visit: 0  Interim History: Aribelle states that she did not eat on plan on Thanksgiving, a few days after Thanksgiving, indulged in a milkshake after her freezer stopped working, and indulged when she went out with friends last night. She is doing a great job tracking her food. Her calorie and protein goals were increased after checking her IC last visit and her hunger is better controlled.  Subjective:   Prediabetes. Rodolfo is on Trulicity 1.5 mg weekly and denies polyphagia. No hypoglycemia.   Lab Results  Component Value Date   HGBA1C 5.4 06/27/2020   HGBA1C 5.5 01/02/2020   HGBA1C 5.6 08/22/2019   Lab Results  Component Value Date   LDLCALC 51 01/02/2020   CREATININE 0.62 06/27/2020   Lab Results  Component Value Date   INSULIN 13.7 06/27/2020   INSULIN 13.5 01/02/2020   INSULIN 21.5 08/22/2019   INSULIN 20.4 01/12/2019   Essential hypertension. Blood pressure is slightly elevated today. Artice is followed by her PCP and is on metoprolol and HCTZ. No  chest pain or headache.   BP Readings from Last 3 Encounters:  11/13/20 137/74  10/22/20 122/73  10/01/20 114/74   Lab Results  Component Value Date   CREATININE 0.62 06/27/2020   CREATININE 0.64 01/02/2020   CREATININE 0.68 08/22/2019    Assessment/Plan:   Prediabetes. Jenafer is working on healthy  weight loss and exercise. She is taking Trulicity. Shagun will continue her medication as directed.   Essential hypertension. Chia is working on healthy weight loss and exercise to improve blood pressure control. We will watch for signs of hypotension as she continues her lifestyle modifications. She will continue her medications as directed. Will monitor blood pressure at each visit.  Class 3 severe obesity with serious comorbidity and body mass index (BMI) of 40.0 to 44.9 in adult, unspecified obesity type ([place]).  Felecity is currently in the action stage of change. As such, her goal is to continue with weight loss efforts. She has agreed to keeping a food journal and adhering to recommended goals of 1400-1500 calories and 95 grams of protein daily.   Exercise goals: Older adults should follow the adult guidelines. When older adults cannot meet the adult guidelines, they should be as physically active as their abilities and conditions will allow.   Behavioral modification strategies: planning for success and keeping a strict food journal.  Keeley has agreed to follow-up with our clinic in 3 weeks. She was informed of the importance of frequent follow-up visits to maximize her success with intensive lifestyle modifications for her multiple health conditions.   Objective:   Blood pressure 137/74, pulse 67, temperature 98.3 F (36.8 C), height 4\' 11"  (1.499 m), weight 209 lb (94.8 kg), last menstrual period 02/12/2011, SpO2 96 %. Body mass index is 42.21 kg/m.  General: Cooperative, alert, well developed, in no acute distress. HEENT: Conjunctivae and lids  unremarkable. Cardiovascular: Regular rhythm.  Lungs: Normal work of breathing. Neurologic: No focal deficits.   Lab Results  Component Value Date   CREATININE 0.62 06/27/2020   BUN 21 06/27/2020   NA 140 06/27/2020   K 4.5 06/27/2020   CL 101 06/27/2020   CO2 26 06/27/2020   Lab Results  Component Value Date   ALT 28 06/27/2020    AST 27 06/27/2020   ALKPHOS 74 06/27/2020   BILITOT 0.4 06/27/2020   Lab Results  Component Value Date   HGBA1C 5.4 06/27/2020   HGBA1C 5.5 01/02/2020   HGBA1C 5.6 08/22/2019   HGBA1C 5.9 (H) 01/12/2019   HGBA1C 5.9 06/07/2018   Lab Results  Component Value Date   INSULIN 13.7 06/27/2020   INSULIN 13.5 01/02/2020   INSULIN 21.5 08/22/2019   INSULIN 20.4 01/12/2019   Lab Results  Component Value Date   TSH 0.98 07/31/2020   Lab Results  Component Value Date   CHOL 117 01/02/2020   HDL 53 01/02/2020   LDLCALC 51 01/02/2020   TRIG 57 01/02/2020   CHOLHDL 2 06/07/2018   Lab Results  Component Value Date   WBC 8.1 06/27/2020   HGB 15.9 06/27/2020   HCT 47.9 (H) 06/27/2020   MCV 94 06/27/2020   PLT 243 06/27/2020   No results found for: IRON, TIBC, FERRITIN  Obesity Behavioral Intervention:   Approximately 15 minutes were spent on the discussion below.  ASK: We discussed the diagnosis of obesity with [name] today and Harlene agreed to give [name] permission to discuss obesity behavioral modification therapy today.  ASSESS: Berry has the diagnosis of obesity and her BMI today is 42.3. Shaleka is in the action stage of change.   ADVISE: Xolani was educated on the multiple health risks of obesity as well as the benefit of weight loss to improve her health. She was advised of the need for long term treatment and the importance of lifestyle modifications to improve her current health and to decrease her risk of future health problems.  AGREE: Multiple dietary modification options and treatment options were discussed and Zierra agreed to follow the recommendations documented in the above note.  ARRANGE: Alonda was educated on the importance of frequent visits to treat obesity as outlined per CMS and USPSTF guidelines and agreed to schedule her next follow up appointment today.  Attestation Statements:   Reviewed by clinician on day of visit: allergies, medications, problem list,  medical history, surgical history, family history, social history, and previous encounter notes.  [name], am acting as transcriptionist for Abby Potash, PA-C   I have reviewed the above documentation for accuracy and completeness, and I agree with the above. Abby Potash, PA-C

## 2020-12-04 ENCOUNTER — Ambulatory Visit: Payer: Medicare Other | Admitting: Orthotics

## 2020-12-04 ENCOUNTER — Other Ambulatory Visit: Payer: Self-pay

## 2020-12-04 DIAGNOSIS — M2141 Flat foot [pes planus] (acquired), right foot: Secondary | ICD-10-CM

## 2020-12-04 DIAGNOSIS — M19071 Primary osteoarthritis, right ankle and foot: Secondary | ICD-10-CM

## 2020-12-04 DIAGNOSIS — M205X9 Other deformities of toe(s) (acquired), unspecified foot: Secondary | ICD-10-CM

## 2020-12-04 DIAGNOSIS — M2142 Flat foot [pes planus] (acquired), left foot: Secondary | ICD-10-CM

## 2020-12-04 NOTE — Progress Notes (Signed)
Patient came into today to be cast for [employer] [facility]. Upon recommendation of Dr.  Patient presents with Goals are Plan vendor

## 2020-12-05 ENCOUNTER — Ambulatory Visit (INDEPENDENT_AMBULATORY_CARE_PROVIDER_SITE_OTHER): Payer: Medicare Other | Admitting: Family Medicine

## 2020-12-10 ENCOUNTER — Ambulatory Visit: Payer: Medicare Other | Admitting: Pulmonary Disease

## 2020-12-12 ENCOUNTER — Ambulatory Visit (INDEPENDENT_AMBULATORY_CARE_PROVIDER_SITE_OTHER): Payer: Medicare Other | Admitting: Family Medicine

## 2020-12-12 ENCOUNTER — Other Ambulatory Visit: Payer: Self-pay

## 2020-12-12 ENCOUNTER — Encounter (INDEPENDENT_AMBULATORY_CARE_PROVIDER_SITE_OTHER): Payer: Self-pay | Admitting: Family Medicine

## 2020-12-12 VITALS — BP 136/74 | HR 63 | Temp 97.9°F | Ht 59.0 in | Wt 210.0 lb

## 2020-12-12 DIAGNOSIS — I10 Essential (primary) hypertension: Secondary | ICD-10-CM | POA: Diagnosis not present

## 2020-12-12 DIAGNOSIS — Z6841 Body Mass Index (BMI) 40.0 and over, adult: Secondary | ICD-10-CM | POA: Diagnosis not present

## 2020-12-12 DIAGNOSIS — R7303 Prediabetes: Secondary | ICD-10-CM | POA: Diagnosis not present

## 2020-12-16 ENCOUNTER — Other Ambulatory Visit: Payer: Self-pay | Admitting: Cardiology

## 2020-12-16 NOTE — Progress Notes (Unsigned)
Chief Complaint:   OBESITY Nichole Smith is here to discuss her progress with her obesity treatment plan along with follow-up of her obesity related diagnoses. Nichole Smith is on keeping a food journal and adhering to recommended goals of 1400-1500 calories and 95 g protein and states she is following her eating plan approximately 75% of the time. Nichole Smith states she is walking, water aerobics, strength training 15-20 minutes 7 times per week.  Today's visit was #: 36 Starting weight: 238 lbs Starting date: 01/12/2019 Today's weight: 210 lbs Today's date: 12/12/2020 Total lbs lost to date: 28 lbs Total lbs lost since last in-office visit: +1 lb Total weight loss percentage to date: -11.76%  Interim History: "I went nuts over Christmas." "Ate what I wanted." Nichole Smith is happy with 1 lb weight gain as she was up six pounds and lost 5 since the new year. She averages 1536 calories a day and is well over 100 grams of protein a day. She is tracking daily. She is starting to move more now that she has orthotics for foot pain.  Assessment/Plan:   1. Essential hypertension Nichole Smith reports home blood pressure readings 120's/70's. She is prescribed HCTZ, Cardizem, and metoprolol. Review: taking medications as instructed, no medication side effects noted, no chest pain on exertion, no dyspnea on exertion, no swelling of ankles.   BP Readings from Last 3 Encounters:  12/12/20 136/74  11/13/20 137/74  10/22/20 122/73   Plan: Nichole Smith is working on healthy weight loss and exercise to improve blood pressure control. We will watch for signs of hypotension as she continues her lifestyle modifications.  2. Pre-diabetes Nichole Smith has a diagnosis of prediabetes based on her elevated HgA1c and was informed this puts her at greater risk of developing diabetes. She continues to work on diet and exercise to decrease her risk of diabetes. She denies nausea or hypoglycemia. Nichole Smith highest A1c was 5.9 on 01/12/2019. She is not on  medication.   Lab Results  Component Value Date   HGBA1C 5.4 06/27/2020   Lab Results  Component Value Date   INSULIN 13.7 06/27/2020   INSULIN 13.5 01/02/2020   INSULIN 21.5 08/22/2019   INSULIN 20.4 01/12/2019   Plan: Nichole Smith will continue to work on weight loss, exercise, and decreasing simple carbohydrates to help decrease the risk of diabetes.   3. Class 3 severe obesity with serious comorbidity and body mass index (BMI) of 40.0 to 44.9 in adult, unspecified obesity type Nichole Smith) Nichole Smith is currently in the action stage of change. As such, her goal is to continue with weight loss efforts. She has agreed to keeping a food journal and adhering to recommended goals of 1400-1500 calories and 95 g protein.   Exercise goals: Increase as tolerated to 150 minutes a week, plus 2 days a week of strength training.  Behavioral modification strategies: no skipping meals, meal planning and cooking strategies, keeping healthy foods in the home, avoiding temptations and planning for success.  Nichole Smith has agreed to follow-up with our clinic in 2-3 weeks with Nichole Smith or Nichole Smith. She was informed of the importance of frequent follow-up visits to maximize her success with intensive lifestyle modifications for her multiple health conditions.    Objective:   Blood pressure 136/74, pulse 63, temperature 97.9 F (36.6 C), height 4\' 11"  (1.499 m), weight 210 lb (95.3 kg), last menstrual period 02/12/2011, SpO2 96 %. Body mass index is 42.41 kg/m.  General: Cooperative, alert, well developed, in no acute distress. HEENT: Conjunctivae and lids unremarkable.  Cardiovascular: Regular rhythm.  Lungs: Normal work of breathing. Neurologic: No focal deficits.   Lab Results  Component Value Date   CREATININE 0.62 06/27/2020   BUN 21 06/27/2020   NA 140 06/27/2020   K 4.5 06/27/2020   CL 101 06/27/2020   CO2 26 06/27/2020   Lab Results  Component Value Date   ALT 28 06/27/2020   AST 27 06/27/2020   ALKPHOS 74  06/27/2020   BILITOT 0.4 06/27/2020   Lab Results  Component Value Date   HGBA1C 5.4 06/27/2020   HGBA1C 5.5 01/02/2020   HGBA1C 5.6 08/22/2019   HGBA1C 5.9 (H) 01/12/2019   HGBA1C 5.9 06/07/2018   Lab Results  Component Value Date   INSULIN 13.7 06/27/2020   INSULIN 13.5 01/02/2020   INSULIN 21.5 08/22/2019   INSULIN 20.4 01/12/2019   Lab Results  Component Value Date   TSH 0.98 07/31/2020   Lab Results  Component Value Date   CHOL 117 01/02/2020   HDL 53 01/02/2020   LDLCALC 51 01/02/2020   TRIG 57 01/02/2020   CHOLHDL 2 06/07/2018   Lab Results  Component Value Date   WBC 8.1 06/27/2020   HGB 15.9 06/27/2020   HCT 47.9 (H) 06/27/2020   MCV 94 06/27/2020   PLT 243 06/27/2020   No results found for: IRON, TIBC, FERRITIN  Obesity Behavioral Intervention:   Approximately 15 minutes were spent on the discussion below.  ASK: We discussed the diagnosis of obesity with Nichole Smith today and Nichole Smith agreed to give [name] permission to discuss obesity behavioral modification therapy today.  ASSESS: Nichole Smith has the diagnosis of obesity and her BMI today is 42.5. Nichole Smith is in the action stage of change.   ADVISE: Nichole Smith was educated on the multiple health risks of obesity as well as the benefit of weight loss to improve her health. She was advised of the need for long term treatment and the importance of lifestyle modifications to improve her current health and to decrease her risk of future health problems.  AGREE: Multiple dietary modification options and treatment options were discussed and Nichole Smith agreed to follow the recommendations documented in the above note.  ARRANGE: Nichole Smith was educated on the importance of frequent visits to treat obesity as outlined per CMS and USPSTF guidelines and agreed to schedule her next follow up appointment today.  Attestation Statements:   Reviewed by clinician on day of visit: allergies, medications, problem list, medical history, surgical  history, family history, social history, and previous encounter notes.  Nichole Smith, am acting as Location manager for [employer], DO.  I have reviewed the above documentation for accuracy and completeness, and I agree with the above. Marjory Sneddon, D.O.  The [place] was signed into law in 2016 which includes the topic of electronic health records.  This provides immediate access to information in MyChart.  This includes consultation notes, operative notes, office notes, lab results and pathology reports.  If you have any questions about what you read please let us know at your next visit so we can discuss your concerns and take corrective action if need be.  We are right here with you.

## 2020-12-25 ENCOUNTER — Other Ambulatory Visit: Payer: Self-pay

## 2020-12-25 ENCOUNTER — Ambulatory Visit (INDEPENDENT_AMBULATORY_CARE_PROVIDER_SITE_OTHER): Payer: Medicare Other | Admitting: Pulmonary Disease

## 2020-12-25 ENCOUNTER — Encounter: Payer: Self-pay | Admitting: Pulmonary Disease

## 2020-12-25 VITALS — BP 124/68 | HR 77 | Temp 98.0°F | Ht 59.0 in | Wt 217.4 lb

## 2020-12-25 DIAGNOSIS — Z0000 Encounter for general adult medical examination without abnormal findings: Secondary | ICD-10-CM

## 2020-12-25 DIAGNOSIS — G4733 Obstructive sleep apnea (adult) (pediatric): Secondary | ICD-10-CM | POA: Diagnosis not present

## 2020-12-25 DIAGNOSIS — Z6841 Body Mass Index (BMI) 40.0 and over, adult: Secondary | ICD-10-CM

## 2020-12-25 NOTE — Assessment & Plan Note (Addendum)
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  Continue to follow with PCP.

## 2020-12-25 NOTE — Assessment & Plan Note (Signed)
Currently working with [facility] weight management center.     Plan:  Keep up the hard work working with [facility]. Continue with PCP

## 2020-12-25 NOTE — Patient Instructions (Addendum)
You were seen today by Nichole Rinne, NP  for:   1. Obstructive sleep apnea  We will order new CPAP DME [place] for 1 year  Keep up the hard work with using your CPAP you are doing a great job contact us with any questions   We recommend that you continue using your CPAP daily >>>Keep up the hard work using your device >>> Goal should be wearing this for the entire night that you are sleeping, at least 4 to 6 hours  Remember:  . Do not drive or operate heavy machinery if tired or drowsy.  . Please notify the supply company and office if you are unable to use your device regularly due to missing supplies or machine being broken.  . Work on maintaining a healthy weight and following your recommended nutrition plan  . Maintain proper daily exercise and movement  . Maintaining proper use of your device can also help improve management of other chronic illnesses such as: Blood pressure, blood sugars, and weight management.   BiPAP/ CPAP Cleaning:  >>>Clean weekly, with Dawn soap, and bottle brush.  Set up to air dry. >>> Wipe mask out daily with wet wipe or towelette    2. Healthcare maintenance  Great job being vaccinated COVID-19  3. BMI 43.91  Keep up the hard work working with medical [facility] as well as primary care  You are doing great  Follow Up:    Return in about 1 year (around 12/25/2021), or if symptoms worsen or fail to improve, for Follow up with Dr. Halford Chessman, Follow up with Wyn Quaker FNP-C.   Notification of test results are managed in the following manner: If there are  any recommendations or changes to the  plan of care discussed in office today,  we will contact you and let you know what they are. If you do not hear from [name], then your results are normal and you can view them through your  MyChart account , or a letter will be sent to you. Thank you again for trusting [name] with your care  - Thank you, [name]    It is flu season:    >>> Best ways to protect herself from the flu: Receive the yearly flu vaccine, practice good hand hygiene washing with soap and also using hand sanitizer when available, eat a nutritious meals, get adequate rest, hydrate appropriately       Please contact the office if your symptoms worsen or you have concerns that you are not improving.   Thank you for choosing [facility] for your healthcare, and for allowing [name] to partner with you on your healthcare journey. I am thankful to be able to provide care to you today.   Wyn Quaker FNP-C    Sleep Apnea Sleep apnea affects breathing during sleep. It causes breathing to stop for a short time or to become shallow. It can also increase the risk of:  Heart attack.  Stroke.  Being very overweight (obese).  Diabetes.  Heart failure.  Irregular heartbeat. The goal of treatment is to help you breathe normally again. What are the causes? There are three kinds of sleep apnea:  Obstructive sleep apnea. This is caused by a blocked or collapsed airway.  Central sleep apnea. This happens when the brain does not send the right signals to the muscles that control breathing.  Mixed sleep apnea. This is a combination of obstructive and central sleep apnea. The most common  cause of this condition is a collapsed or blocked airway. This can happen if:  Your throat muscles are too relaxed.  Your tongue and tonsils are too large.  You are overweight.  Your airway is too small.   What increases the risk?  Being overweight.  Smoking.  Having a small airway.  Being older.  Being female.  Drinking alcohol.  Taking medicines to calm yourself (sedatives or tranquilizers).  Having family members with the condition. What are the signs or symptoms?  Trouble staying asleep.  Being sleepy or tired during the day.  Getting angry a lot.  Loud snoring.  Headaches in the morning.  Not being able to focus your mind  (concentrate).  Forgetting things.  Less interest in sex.  Mood swings.  Personality changes.  Feelings of sadness (depression).  Waking up a lot during the night to pee (urinate).  Dry mouth.  Sore throat. How is this diagnosed?  Your medical history.  A physical exam.  A test that is done when you are sleeping (sleep study). The test is most often done in a sleep lab but may also be done at home. How is this treated?  Sleeping on your side.  Using a medicine to get rid of mucus in your nose (decongestant).  Avoiding the use of alcohol, medicines to help you relax, or certain pain medicines (narcotics).  Losing weight, if needed.  Changing your diet.  Not smoking.  Using a machine to open your airway while you sleep, such as: ? An oral appliance. This is a mouthpiece that shifts your lower jaw forward. ? A CPAP device. This device blows air through a mask when you breathe out (exhale). ? An EPAP device. This has valves that you put in each nostril. ? A BPAP device. This device blows air through a mask when you breathe in (inhale) and breathe out.  Having surgery if other treatments do not work. It is important to get treatment for sleep apnea. Without treatment, it can lead to:  High blood pressure.  Coronary artery disease.  In men, not being able to have an erection (impotence).  Reduced thinking ability.   Follow these instructions at home: Lifestyle  Make changes that your doctor recommends.  Eat a healthy diet.  Lose weight if needed.  Avoid alcohol, medicines to help you relax, and some pain medicines.  Do not use any products that contain nicotine or tobacco, such as cigarettes, e-cigarettes, and chewing tobacco. If you need help quitting, ask your doctor. General instructions  Take over-the-counter and prescription medicines only as told by your doctor.  If you were given a machine to use while you sleep, use it only as told by your  doctor.  If you are having surgery, make sure to tell your doctor you have sleep apnea. You may need to bring your device with you.  Keep all follow-up visits as told by your doctor. This is important. Contact a doctor if:  The machine that you were given to use during sleep bothers you or does not seem to be working.  You do not get better.  You get worse. Get help right away if:  Your chest hurts.  You have trouble breathing in enough air.  You have an uncomfortable feeling in your back, arms, or stomach.  You have trouble talking.  One side of your body feels weak.  A part of your face is hanging down. These symptoms may be an emergency. Do not wait  to see if the symptoms will go away. Get medical help right away. Call your local emergency services (911 in the [place] Do not drive yourself to the hospital. Summary  This condition affects breathing during sleep.  The most common cause is a collapsed or blocked airway.  The goal of treatment is to help you breathe normally while you sleep. This information is not intended to replace advice given to you by your health care provider. Make sure you discuss any questions you have with your health care provider. Document Revised: 09/03/2018 Document Reviewed: 07/13/2018 Elsevier Patient Education  [place].

## 2020-12-25 NOTE — Assessment & Plan Note (Addendum)
11/24/2020 - 12/23/2020-CPAP compliance report; 30 out of last 30 days used, average usage 7 hours and 52 minutes, CPAP set pressure of 16, AHI 0.4   Plan:  Maintain CPAP 16 cm H2O Rx sent for new CPAP device CPAP supplies reordered

## 2020-12-25 NOTE — Progress Notes (Signed)
@Patient  ID: Nichole Smith, female    DOB: 07/14/1956, 65 y.o.   MRN: [id number]  Chief Complaint  Patient presents with  . Follow-up    Would like new CPAP machine     Referring provider: Burnis Medin, MD  HPI: 65 y.o. female with obstructive sleep apnea.   PMH: Urine incontinence, PCOS, OA, Leukocytoclastic vasculitis, Hypothyroidism, HTN, Coronary calcification, Carcinoid tumor of lung 2012, Class 3 severe obesity, palpitations, HTN, polycythemia, hypothyroidism & osteoarthritis.   Smoker/ Smoking History: Never smoked Maintenance:  None Pt of: Dr. Halford Chessman  12/25/2020  - 65 y.o. female with OSA presents for 1 year follow up. Last office visit, 07/2019 via telephone with Dr. Halford Chessman, plan from visit was call or email if you find out you can get a new CPAP machine, continue CPAP 16 cm H2O, avoiding rest periods during the day and limit her time in bed, enrolled in [facility] weight management program, and f /u with urology.  Maintained on CPAP, no concerns today.   Compliance report:  11/24/2020 - 12/23/2020-CPAP compliance report; 30 out of last 30 days used, average usage 7 hours and 52 minutes, CPAP set pressure of 16, AHI 0.4  Requesting order for new CPAP and refill for supplies. Will address today.   Tests:    WALK:  SIX MIN WALK 03/22/2014  Supplimental Oxygen during Test? (L/min) No    Imaging: 08/03/20-CXR-  1.No radiographic evidence of acute cardiopulmonary disease. 2. Aortic atherosclerosis.   Lab Results:  CBC    Component Value Date/Time   WBC 8.1 06/27/2020 1123   WBC 7.3 06/07/2018 0739   RBC 5.09 06/27/2020 1123   RBC 4.86 06/07/2018 0739   HGB 15.9 06/27/2020 1123   HGB 15.4 03/13/2014 1422   HCT 47.9 (H) 06/27/2020 1123   HCT 45.3 03/13/2014 1422   PLT 243 06/27/2020 1123   MCV 94 06/27/2020 1123   MCV 89.0 03/13/2014 1422   MCH 31.2 06/27/2020 1123   MCH 31.3 12/21/2017 1049   MCHC 33.2 06/27/2020 1123   MCHC 34.3 06/07/2018 0739   RDW 12.1  06/27/2020 1123   RDW 13.2 03/13/2014 1422   LYMPHSABS 2.0 06/27/2020 1123   LYMPHSABS 3.2 03/13/2014 1422   MONOABS 0.6 06/07/2018 0739   MONOABS 0.7 03/13/2014 1422   EOSABS 0.2 06/27/2020 1123   BASOSABS 0.0 06/27/2020 1123   BASOSABS 0.0 03/13/2014 1422    BMET    Component Value Date/Time   NA 140 06/27/2020 1123   K 4.5 06/27/2020 1123   CL 101 06/27/2020 1123   CO2 26 06/27/2020 1123   GLUCOSE 82 06/27/2020 1123   GLUCOSE 100 (H) 06/07/2018 0739   BUN 21 06/27/2020 1123   CREATININE 0.62 06/27/2020 1123   CALCIUM 9.3 06/27/2020 1123   GFRNONAA 95 06/27/2020 1123   GFRAA 109 06/27/2020 1123    BNP No results found for: BNP  ProBNP No results found for: PROBNP   Pulmonary Problems: Carcinoid tumor of lung OSA    Allergies  Allergen Reactions  . Neomycin Other (See Comments)    Inflammation--eye drop   . Ace Inhibitors Cough    Immunization History  Administered Date(s) Administered  . Fluad Quad(high Dose 65+) 09/21/2019, 09/10/2020  . Influenza Split 10/17/2011, 08/12/2012, 08/01/2013  . Influenza, Quadrivalent, Recombinant, Inj, Pf 08/12/2018  . Influenza,inj,Quad PF,6+ Mos 09/04/2014, 08/27/2015, 08/01/2016, 08/10/2017  . Influenza-Unspecified 08/30/2018  . Moderna Sars-Covid-2 Vaccination 12/30/2019, 01/27/2020, 09/22/2020  . Pneumococcal Conjugate-13 10/05/2019  . Pneumococcal Polysaccharide-23 09/10/2020  .  Td 12/01/1997, 10/16/2008  . Tdap 01/30/2014  . Zoster 05/25/2012  . Zoster Recombinat (Shingrix) 01/12/2017, 04/02/2017    Past Medical History:  Diagnosis Date  . Arthritis    knees, back  . Bursitis    hips - almost gone per pt   . Cancer ([place])   . Carcinoid tumor of lung 2012   right found incidentally on chest xray eval for r/o vasxculitis   . Carpal tunnel syndrome, bilateral    bilat but surgery to correct   . Coronary artery calcification 07/23/2016  . Double vision    history, no current problem  . Dysrhythmia     irregular occassional, no current problem  . Frequent UTI   . HNP (herniated nucleus pulposus), cervical 01/22/2011   C6-C7  Has seen dr Vertell Limber for this and Dr Tonita Cong  Positional numbness in hands no weakness    . Hypertension   . Hypothyroidism   . Infertility, female   . Leukocytoclastic vasculitis ([place]) 05/26/2011  . Obstructive sleep apnea 07/27/2014   NPSG 07/2014:  AHI 85/hr, optimal cpap 10cm.  Download 08/2014:  Good compliance, breakthru apnea on 10cm.  Changed to auto 5-12cm >> good control of AHI on f/u download ONO on CPAP:      . Osteoarthritis   . PCOS (polycystic ovarian syndrome)   . Sleep apnea    wears cpap   . SUI (stress urinary incontinence, female)    h/o    Tobacco History: Social History   Tobacco Use  Smoking Status Never Smoker  Smokeless Tobacco Never Used   Counseling given: Not Answered   Continue to not smoke  Outpatient Encounter Medications as of 12/25/2020  Medication Sig  . aspirin EC 81 MG tablet Take 81 mg by mouth at bedtime.  [name] atorvastatin (LIPITOR) 40 MG tablet Take 1 tablet (40 mg total) by mouth daily.  [name] augmented betamethasone dipropionate (DIPROLENE-AF) 0.05 % ointment Apply topically.  [name] CALCIUM PO Take 1 tablet by mouth at bedtime.   . Cholecalciferol (VITAMIN D3) 50 MCG (2000 UT) TABS Take by mouth.  . diltiazem (CARDIZEM CD) 180 MG 24 hr capsule Take 1 capsule (180 mg total) by mouth daily.  . Dulaglutide (TRULICITY) 1.5 QQ/5.9DG SOPN Inject 1.5 mg into the skin once a week.  . estradiol (ESTRACE VAGINAL) 0.1 MG/GM vaginal cream INSERT 1 GM VAGINALLY TWICE WEEKLY  . hydrochlorothiazide (MICROZIDE) 12.5 MG capsule TAKE 1 CAPSULE DAILY.   SCHEDULE ANNUAL VISIT  . methocarbamol (ROBAXIN) 500 MG tablet Take 1 tablet (500 mg total) by mouth 3 (three) times daily.  . metoprolol succinate (TOPROL-XL) 50 MG 24 hr tablet TAKE 1 TABLET DAILY (NEED APPOINTMENT FOR FUTURE REFILLS)  . Multiple Vitamins-Minerals (PRESERVISION AREDS 2 PO) Take 1  tablet by mouth 2 (two) times daily.  . naproxen sodium (ALEVE) 220 MG tablet Take 220 mg by mouth 2 (two) times daily as needed (for pain.).  [name] SYNTHROID 137 MCG tablet TAKE 1 TABLET DAILY  . TURMERIC PO Take by mouth daily.   No facility-administered encounter medications on file as of 12/25/2020.     Review of Systems  Review of Systems  Constitutional: Negative.   HENT: Negative for congestion, nosebleeds and rhinorrhea.   Eyes: Negative.   Respiratory: Negative.   Cardiovascular: Negative.   Gastrointestinal: Negative.   Endocrine: Negative.   Skin: Negative.   Neurological: Negative.   Hematological: Negative.   Psychiatric/Behavioral: Negative.      Physical Exam  BP 124/68 (BP  Location: Left Arm, Cuff Size: Normal)   Pulse 77   Temp 98 F (36.7 C) (Other (Comment)) Comment (Src): wrist  Ht 4\' 11"  (1.499 m)   Wt 217 lb 6.4 oz (98.6 kg)   LMP 02/12/2011   SpO2 97% Comment: Room air  BMI 43.91 kg/m   Wt Readings from Last 5 Encounters:  12/25/20 217 lb 6.4 oz (98.6 kg)  12/12/20 210 lb (95.3 kg)  11/13/20 209 lb (94.8 kg)  10/22/20 208 lb (94.3 kg)  10/01/20 210 lb (95.3 kg)    BMI Readings from Last 5 Encounters:  12/25/20 43.91 kg/m  12/12/20 42.41 kg/m  11/13/20 42.21 kg/m  10/22/20 42.01 kg/m  10/01/20 42.41 kg/m     Physical Exam Constitutional:      Appearance: Normal appearance.  HENT:     Head: Normocephalic.     Nose: Nose normal.  Cardiovascular:     Rate and Rhythm: Normal rate and regular rhythm.     Pulses: Normal pulses.  Pulmonary:     Breath sounds: Normal breath sounds.  Musculoskeletal:        General: Normal range of motion.  Skin:    General: Skin is warm and dry.  Neurological:     General: No focal deficit present.     Mental Status: She is alert.  Psychiatric:        Mood and Affect: Mood normal.       Assessment & Plan:   Obstructive sleep apnea 11/24/2020 - 12/23/2020-CPAP compliance report; 30 out of  last 30 days used, average usage 7 hours and 52 minutes, CPAP set pressure of 16, AHI 0.4   Plan:  Maintain CPAP 16 cm H2O Rx sent for new CPAP device CPAP supplies reordered   Healthcare maintenance Great job on being vaccinated against Covid-19.  Continue to follow with PCP.  Severe obesity (BMI >= 40) Currently working with [facility] weight management center.     Plan:  Keep up the hard work working with [facility]. Continue with PCP    Return in about 1 year (around 12/25/2021), or if symptoms worsen or fail to improve, for Follow up with Dr. Halford Chessman, Follow up with Wyn Quaker FNP-C.   Tula Nakayama, RN 12/25/2020   This appointment required 32 minutes of patient care (this includes precharting, chart review, review of results, face-to-face care, etc.).

## 2020-12-26 ENCOUNTER — Telehealth: Payer: Self-pay | Admitting: Pulmonary Disease

## 2020-12-26 DIAGNOSIS — G4733 Obstructive sleep apnea (adult) (pediatric): Secondary | ICD-10-CM

## 2020-12-26 NOTE — Telephone Encounter (Signed)
Order for new cpap sent to [facility] yesterday with note to keep current settings.  I received a message back from [place] at [facility] this morning asking for pressure for pap to be added to order.

## 2020-12-26 NOTE — Telephone Encounter (Signed)
BPM what is the settings for the cpap?  They will need a new order with the settings sent in to [facility].    CPAP set pressure of 16, AHI 0.4  Is this the correct settings?  This was in your note.  Thanks

## 2020-12-26 NOTE — Telephone Encounter (Signed)
Yes this is correct   Nichole Smith

## 2020-12-26 NOTE — Telephone Encounter (Signed)
New order has been placed. 

## 2021-01-02 ENCOUNTER — Encounter (INDEPENDENT_AMBULATORY_CARE_PROVIDER_SITE_OTHER): Payer: Self-pay | Admitting: Family Medicine

## 2021-01-02 ENCOUNTER — Ambulatory Visit (INDEPENDENT_AMBULATORY_CARE_PROVIDER_SITE_OTHER): Payer: Medicare Other | Admitting: Family Medicine

## 2021-01-02 ENCOUNTER — Other Ambulatory Visit: Payer: Self-pay

## 2021-01-02 VITALS — BP 127/77 | HR 68 | Temp 97.6°F | Ht 59.0 in | Wt 211.0 lb

## 2021-01-02 DIAGNOSIS — R7303 Prediabetes: Secondary | ICD-10-CM | POA: Diagnosis not present

## 2021-01-02 DIAGNOSIS — Z6841 Body Mass Index (BMI) 40.0 and over, adult: Secondary | ICD-10-CM | POA: Diagnosis not present

## 2021-01-03 NOTE — Progress Notes (Signed)
Chief Complaint:   OBESITY Nichole Smith is here to discuss her progress with her obesity treatment plan along with follow-up of her obesity related diagnoses. Nichole Smith is on keeping a food journal and adhering to recommended goals of 1400-1500 calories and 95 grams of protein daily and states she is following her eating plan approximately 80% of the time. Nichole Smith states she is doing cardio and in the pool for 250 minutes 6-7 times per week.  Today's visit was #: 39 Starting weight: 238 lbs Starting date: 01/12/2019 Today's weight: 211 lbs Today's date: 01/02/2021 Total lbs lost to date: 27 Total lbs lost since last in-office visit: 0  Interim History: Nichole Smith is getting >95 grams of protein daily. She does average 1500 calories per day but she is striving to do 1400 calories per day. Her weight loss has been very slow and actually plateaued for the past year. RMR was 1854 10/22/20. 1500 cal per day should result in slow weight loss.  She is very detailed and journals consistently. She wants to build strength and be able to do planks. Her hunger is a consistent issue for her.  Subjective:   1. Pre-diabetes Nichole Smith is currently on Trulicity, but she is unsure if it helps with appetite. Last A1c was 5.4 (previously 5.9). She has been on metformin but did not find this to be particularly beneficial for polyphagia. .    Lab Results  Component Value Date   HGBA1C 5.4 06/27/2020   Lab Results  Component Value Date   INSULIN 13.7 06/27/2020   INSULIN 13.5 01/02/2020   INSULIN 21.5 08/22/2019   INSULIN 20.4 01/12/2019   Assessment/Plan:   1. Pre-diabetes Nichole Smith will continue Trulicity. We discussed Topamax briefly and may consider this for appetite and cravings. She has no history of nephrolithiasis.   2. Class 3 severe obesity with serious comorbidity and body mass index (BMI) of 40.0 to 44.9 in adult, unspecified obesity type Nichole Smith) Nichole Smith is currently in the action stage of change. As such, her  goal is to continue with weight loss efforts. She has agreed to the Category 3 Plan or keeping a food journal and adhering to recommended goals of 1400-1500 calories and 95 grams of protein daily.   Handout was given today: 100 calories snacks.   Exercise goals: As is.  Behavioral modification strategies: decreasing simple carbohydrates and planning for success.  Nichole Smith has agreed to follow-up with our clinic in 3 weeks.  Objective:   Blood pressure 127/77, pulse 68, temperature 97.6 F (36.4 C), height 4\' 11"  (1.499 m), weight 211 lb (95.7 kg), last menstrual period 02/12/2011, SpO2 97 %. Body mass index is 42.62 kg/m.  General: Cooperative, alert, well developed, in no acute distress. HEENT: Conjunctivae and lids unremarkable. Cardiovascular: Regular rhythm.  Lungs: Normal work of breathing. Neurologic: No focal deficits.   Lab Results  Component Value Date   CREATININE 0.62 06/27/2020   BUN 21 06/27/2020   NA 140 06/27/2020   K 4.5 06/27/2020   CL 101 06/27/2020   CO2 26 06/27/2020   Lab Results  Component Value Date   ALT 28 06/27/2020   AST 27 06/27/2020   ALKPHOS 74 06/27/2020   BILITOT 0.4 06/27/2020   Lab Results  Component Value Date   HGBA1C 5.4 06/27/2020   HGBA1C 5.5 01/02/2020   HGBA1C 5.6 08/22/2019   HGBA1C 5.9 (H) 01/12/2019   HGBA1C 5.9 06/07/2018   Lab Results  Component Value Date   INSULIN 13.7 06/27/2020  INSULIN 13.5 01/02/2020   INSULIN 21.5 08/22/2019   INSULIN 20.4 01/12/2019   Lab Results  Component Value Date   TSH 0.98 07/31/2020   Lab Results  Component Value Date   CHOL 117 01/02/2020   HDL 53 01/02/2020   LDLCALC 51 01/02/2020   TRIG 57 01/02/2020   CHOLHDL 2 06/07/2018   Lab Results  Component Value Date   WBC 8.1 06/27/2020   HGB 15.9 06/27/2020   HCT 47.9 (H) 06/27/2020   MCV 94 06/27/2020   PLT 243 06/27/2020   No results found for: IRON, TIBC, FERRITIN  Obesity Behavioral Intervention:   Approximately 15  minutes were spent on the discussion below.  ASK: We discussed the diagnosis of obesity with Nichole Smith today and Nichole Smith agreed to give [name] permission to discuss obesity behavioral modification therapy today.  ASSESS: Nichole Smith has the diagnosis of obesity and her BMI today is 42.59. Nichole Smith is in the action stage of change.   ADVISE: Nichole Smith was educated on the multiple health risks of obesity as well as the benefit of weight loss to improve her health. She was advised of the need for long term treatment and the importance of lifestyle modifications to improve her current health and to decrease her risk of future health problems.  AGREE: Multiple dietary modification options and treatment options were discussed and Nichole Smith agreed to follow the recommendations documented in the above note.  ARRANGE: Nichole Smith was educated on the importance of frequent visits to treat obesity as outlined per CMS and USPSTF guidelines and agreed to schedule her next follow up appointment today.  Attestation Statements:   Reviewed by clinician on day of visit: allergies, medications, problem list, medical history, surgical history, family history, social history, and previous encounter notes.   [name], am acting as Location manager for [employer], FNP-C.  I have reviewed the above documentation for accuracy and completeness, and I agree with the above. -  [name], FNP

## 2021-01-07 ENCOUNTER — Encounter (INDEPENDENT_AMBULATORY_CARE_PROVIDER_SITE_OTHER): Payer: Self-pay | Admitting: Family Medicine

## 2021-01-10 ENCOUNTER — Telehealth: Payer: Self-pay | Admitting: Cardiology

## 2021-01-10 DIAGNOSIS — I10 Essential (primary) hypertension: Secondary | ICD-10-CM

## 2021-01-10 NOTE — Telephone Encounter (Signed)
Patient states she transmitted an EKG from her [employer] device and she would like to make Dr. Radford Pax aware. She requested a call back to confirm that it was received.

## 2021-01-10 NOTE — Telephone Encounter (Signed)
Please see below MyChart message.

## 2021-01-14 ENCOUNTER — Other Ambulatory Visit: Payer: Self-pay | Admitting: Cardiology

## 2021-01-15 ENCOUNTER — Telehealth: Payer: Self-pay | Admitting: Cardiology

## 2021-01-15 MED ORDER — CHLORTHALIDONE 25 MG PO TABS
25.0000 mg | ORAL_TABLET | Freq: Every day | ORAL | 0 refills | Status: DC
Start: 2021-01-15 — End: 2021-01-15

## 2021-01-15 MED ORDER — CHLORTHALIDONE 25 MG PO TABS: 25.0000 mg | ORAL_TABLET | Freq: Every day | ORAL | 3 refills | Status: DC

## 2021-01-15 MED ORDER — CHLORTHALIDONE 25 MG PO TABS
25.0000 mg | ORAL_TABLET | Freq: Every day | ORAL | 3 refills | Status: DC
Start: 2021-01-15 — End: 2021-11-07

## 2021-01-15 NOTE — Telephone Encounter (Signed)
Patient's prescription was sent to the incorrect pharmacy. She would like to have it transferred to the pharmacy listed below. Please assist.   *STAT* If patient is at the pharmacy, call can be transferred to refill team.   1. Which medications need to be refilled? (please list name of each medication and dose if known) Chlorthalidone 25mg    2. Which pharmacy/location (including street and city if local pharmacy) is medication to be sent to? [facility] [address] [place]  3. Do they need a 30 day or 90 day supply? 90 day supply

## 2021-01-15 NOTE — Telephone Encounter (Signed)
Pt's medication was sent to pt's pharmacy as requested. Confirmation received.  °

## 2021-01-22 ENCOUNTER — Other Ambulatory Visit: Payer: Self-pay

## 2021-01-22 ENCOUNTER — Other Ambulatory Visit: Payer: Medicare Other | Admitting: *Deleted

## 2021-01-22 DIAGNOSIS — I10 Essential (primary) hypertension: Secondary | ICD-10-CM

## 2021-01-22 LAB — BASIC METABOLIC PANEL
BUN/Creatinine Ratio: 35 — ABNORMAL HIGH (ref 12–28)
BUN: 24 mg/dL (ref 8–27)
CO2: 27 mmol/L (ref 20–29)
Calcium: 9.7 mg/dL (ref 8.7–10.3)
Chloride: 98 mmol/L (ref 96–106)
Creatinine, Ser: 0.68 mg/dL (ref 0.57–1.00)
GFR calc Af Amer: 105 mL/min/{1.73_m2} (ref >59)
GFR calc non Af Amer: 91 mL/min/{1.73_m2} (ref >59)
Glucose: 89 mg/dL (ref 65–99)
Potassium: 3.9 mmol/L (ref 3.5–5.2)
Sodium: 140 mmol/L (ref 134–144)

## 2021-01-23 ENCOUNTER — Ambulatory Visit (INDEPENDENT_AMBULATORY_CARE_PROVIDER_SITE_OTHER): Payer: Medicare Other | Admitting: Family Medicine

## 2021-01-23 ENCOUNTER — Encounter (INDEPENDENT_AMBULATORY_CARE_PROVIDER_SITE_OTHER): Payer: Self-pay | Admitting: Family Medicine

## 2021-01-23 ENCOUNTER — Other Ambulatory Visit: Payer: Self-pay

## 2021-01-23 VITALS — BP 132/79 | HR 66 | Temp 98.0°F | Ht 59.0 in | Wt 208.0 lb

## 2021-01-23 DIAGNOSIS — Z6841 Body Mass Index (BMI) 40.0 and over, adult: Secondary | ICD-10-CM | POA: Diagnosis not present

## 2021-01-23 DIAGNOSIS — I10 Essential (primary) hypertension: Secondary | ICD-10-CM | POA: Diagnosis not present

## 2021-01-23 DIAGNOSIS — R7303 Prediabetes: Secondary | ICD-10-CM | POA: Diagnosis not present

## 2021-01-23 MED ORDER — METOPROLOL SUCCINATE ER 50 MG PO TB24: ORAL_TABLET | ORAL | 0 refills | Status: DC

## 2021-01-23 MED ORDER — TRULICITY 3 MG/0.5ML ~~LOC~~ SOAJ: 3.0000 mg | SUBCUTANEOUS | 0 refills | Status: DC

## 2021-01-23 NOTE — Addendum Note (Signed)
Addended by: Carter Kitten D on: 01/23/2021 07:16 AM   Modules accepted: Orders

## 2021-01-24 NOTE — Progress Notes (Signed)
Chief Complaint:   OBESITY Nichole Smith is here to discuss her progress with her obesity treatment plan along with follow-up of her obesity related diagnoses. Nichole Smith is on the Category 3 Plan or keeping a food journal and adhering to recommended goals of 1400-1500 calories and 95 grams of protein daily and states she is following her eating plan approximately 75% of the time. [name] states she is doing cardio and strengthening for 30 minutes 7 times per week.  Today's visit was #: 17 Starting weight: 238 lbs Starting date: 01/12/2019 Today's weight: 208 lbs Today's date: 01/23/2021 Total lbs lost to date: 30 Total lbs lost since last in-office visit: 3  Interim History: [name] had a day that calories intake was  >2500  but still lost 3 lbs today. She is struggling with hunger most of the time. She is averaging 1600 calories per day, but she strives for 1400 calories daily. She is very consistent with journaling.  Subjective:   1. Pre-diabetes Nichole Smith notes polyphagia, and she is on Trulicity 1.5 mg weekly.  2. Essential hypertension Nichole Smith's blood pressure is well controlled on chlorthalidone and metoprolol. She has recently had an occurrence of aFib which she captured with an at home heart rhythm device.  Assessment/Plan:   1. Pre-diabetes Increase dose of Trulicity to 3.0 mg weekly for 1 month.  - Dulaglutide (TRULICITY) 3 WP/7.9YI SOPN; Inject 3 mg as directed once a week.  Dispense: 6 mL; Refill: 0  2. Essential hypertension Anivea will follow up with Cardiology on 03/04/2021, and will continue her medications.   3. Class 3 severe obesity with serious comorbidity and body mass index (BMI) of 40.0 to 44.9 in adult, unspecified obesity type Nichole Smith) Nichole Smith is currently in the action stage of change. As such, her goal is to continue with weight loss efforts. She has agreed to keeping a food journal and adhering to recommended goals of 1400-1500 calories and 95 grams of protein daily.    Exercise goals: As is.  Behavioral modification strategies: planning for success.  Nichole Smith has agreed to follow-up with our clinic in 3 weeks.  Objective:   Blood pressure 132/79, pulse 66, temperature 98 F (36.7 C), height 4\' 11"  (1.499 m), weight 208 lb (94.3 kg), last menstrual period 02/12/2011, SpO2 98 %. Body mass index is 42.01 kg/m.  General: Cooperative, alert, well developed, in no acute distress. HEENT: Conjunctivae and lids unremarkable. Cardiovascular: Regular rhythm.  Lungs: Normal work of breathing. Neurologic: No focal deficits.   Lab Results  Component Value Date   CREATININE 0.68 01/22/2021   BUN 24 01/22/2021   NA 140 01/22/2021   K 3.9 01/22/2021   CL 98 01/22/2021   CO2 27 01/22/2021   Lab Results  Component Value Date   ALT 28 06/27/2020   AST 27 06/27/2020   ALKPHOS 74 06/27/2020   BILITOT 0.4 06/27/2020   Lab Results  Component Value Date   HGBA1C 5.4 06/27/2020   HGBA1C 5.5 01/02/2020   HGBA1C 5.6 08/22/2019   HGBA1C 5.9 (H) 01/12/2019   HGBA1C 5.9 06/07/2018   Lab Results  Component Value Date   INSULIN 13.7 06/27/2020   INSULIN 13.5 01/02/2020   INSULIN 21.5 08/22/2019   INSULIN 20.4 01/12/2019   Lab Results  Component Value Date   TSH 0.98 07/31/2020   Lab Results  Component Value Date   CHOL 117 01/02/2020   HDL 53 01/02/2020   LDLCALC 51 01/02/2020   TRIG 57 01/02/2020   CHOLHDL 2  06/07/2018   Lab Results  Component Value Date   WBC 8.1 06/27/2020   HGB 15.9 06/27/2020   HCT 47.9 (H) 06/27/2020   MCV 94 06/27/2020   PLT 243 06/27/2020   No results found for: IRON, TIBC, FERRITIN  Obesity Behavioral Intervention:   Approximately 15 minutes were spent on the discussion below.  ASK: We discussed the diagnosis of obesity with Nichole Smith today and Nichole Smith agreed to give [name] permission to discuss obesity behavioral modification therapy today.  ASSESS: [name] has the diagnosis of obesity and her BMI today is 41.99. [name]  is in the action stage of change.   ADVISE: [name] was educated on the multiple health risks of obesity as well as the benefit of weight loss to improve her health. She was advised of the need for long term treatment and the importance of lifestyle modifications to improve her current health and to decrease her risk of future health problems.  AGREE: Multiple dietary modification options and treatment options were discussed and Nichole Smith agreed to follow the recommendations documented in the above note.  ARRANGE: Nichole Smith was educated on the importance of frequent visits to treat obesity as outlined per CMS and USPSTF guidelines and agreed to schedule her next follow up appointment today.  Attestation Statements:   Reviewed by clinician on day of visit: allergies, medications, problem list, medical history, surgical history, family history, social history, and previous encounter notes.   Nichole Smith, am acting as Location manager for [employer], FNP-C.  I have reviewed the above documentation for accuracy and completeness, and I agree with the above. -  Nichole Fick, FNP

## 2021-01-28 ENCOUNTER — Encounter (INDEPENDENT_AMBULATORY_CARE_PROVIDER_SITE_OTHER): Payer: Self-pay | Admitting: Family Medicine

## 2021-02-05 ENCOUNTER — Other Ambulatory Visit: Payer: Self-pay | Admitting: Cardiology

## 2021-02-12 ENCOUNTER — Ambulatory Visit (INDEPENDENT_AMBULATORY_CARE_PROVIDER_SITE_OTHER): Payer: Medicare Other | Admitting: Family Medicine

## 2021-02-12 ENCOUNTER — Other Ambulatory Visit: Payer: Self-pay

## 2021-02-12 ENCOUNTER — Encounter (INDEPENDENT_AMBULATORY_CARE_PROVIDER_SITE_OTHER): Payer: Self-pay | Admitting: Family Medicine

## 2021-02-12 VITALS — BP 135/70 | HR 56 | Temp 98.0°F | Ht 59.0 in | Wt 207.0 lb

## 2021-02-12 DIAGNOSIS — Z6841 Body Mass Index (BMI) 40.0 and over, adult: Secondary | ICD-10-CM | POA: Diagnosis not present

## 2021-02-12 DIAGNOSIS — R7303 Prediabetes: Secondary | ICD-10-CM | POA: Diagnosis not present

## 2021-02-14 NOTE — Progress Notes (Signed)
Chief Complaint:   OBESITY Nichole Smith is here to discuss her progress with her obesity treatment plan along with follow-up of her obesity related diagnoses. Nichole Smith is on keeping a food journal and adhering to recommended goals of 1400-1500 calories and 95 grams of protein and states she is following her eating plan approximately 50-100% of the time. Nichole Smith states she is doing cardio and strengthening for 30 minutes 7 times per week.  Today's visit was #: 44 Starting weight: 238 lbs Starting date: 01/12/2019 Today's weight: 207 lbs Today's date: 02/12/2021 Total lbs lost to date: 31 lbs Total lbs lost since last in-office visit: 1 lb  Interim History: Nichole Smith says her calories averaged higher than she would like last week (1600 calories).  She has averaged closer to 1400 calories this week, which is her goal.  She has recently broken her plateau and has lost 4 pounds over the last 5-6 weeks.  Weight loss has been quite slow for her, but she is down 31 pounds overall.  She is very disciplined with journaling and has no problem meeting protein goals.  She craves sweets.  Subjective:   1. Pre-diabetes Trulicity dose increased to 3.0 mg weekly at last office visit.  She is unsure if increased dose of Trulicity is helping with appetite.  Last fasting insulin was elevated at 13.7.  A1c is now 5.4, but has been as high as 5.9.  Lab Results  Component Value Date   HGBA1C 5.4 06/27/2020   Lab Results  Component Value Date   INSULIN 13.7 06/27/2020   INSULIN 13.5 01/02/2020   INSULIN 21.5 08/22/2019   INSULIN 20.4 01/12/2019   Assessment/Plan:   1. Pre-diabetes Continue Trulicity 3.0 mg weekly.  2. Class 3 severe obesity with serious comorbidity and body mass index (BMI) of 40.0 to 44.9 in adult, unspecified obesity type [facility])  Nichole Smith is currently in the action stage of change. As such, her goal is to continue with weight loss efforts. She has agreed to keeping a food journal and adhering to  recommended goals of 1400-1500 calories and 95 grams of protein.   Exercise goals: As is.  Behavioral modification strategies: decreasing simple carbohydrates and keeping a strict food journal.  Nichole Smith has agreed to follow-up with our clinic in 3 weeks with Nichole Marble, NP.   Objective:   Blood pressure 135/70, pulse (!) 56, temperature 98 F (36.7 C), height 4\' 11"  (1.499 m), weight 207 lb (93.9 kg), last menstrual period 02/12/2011, SpO2 96 %. Body mass index is 41.81 kg/m.  General: Cooperative, alert, well developed, in no acute distress. HEENT: Conjunctivae and lids unremarkable. Cardiovascular: Regular rhythm.  Lungs: Normal work of breathing. Neurologic: No focal deficits.   Lab Results  Component Value Date   CREATININE 0.68 01/22/2021   BUN 24 01/22/2021   NA 140 01/22/2021   K 3.9 01/22/2021   CL 98 01/22/2021   CO2 27 01/22/2021   Lab Results  Component Value Date   ALT 28 06/27/2020   AST 27 06/27/2020   ALKPHOS 74 06/27/2020   BILITOT 0.4 06/27/2020   Lab Results  Component Value Date   HGBA1C 5.4 06/27/2020   HGBA1C 5.5 01/02/2020   HGBA1C 5.6 08/22/2019   HGBA1C 5.9 (H) 01/12/2019   HGBA1C 5.9 06/07/2018   Lab Results  Component Value Date   INSULIN 13.7 06/27/2020   INSULIN 13.5 01/02/2020   INSULIN 21.5 08/22/2019   INSULIN 20.4 01/12/2019   Lab Results  Component Value  Date   TSH 0.98 07/31/2020   Lab Results  Component Value Date   CHOL 117 01/02/2020   HDL 53 01/02/2020   LDLCALC 51 01/02/2020   TRIG 57 01/02/2020   CHOLHDL 2 06/07/2018   Lab Results  Component Value Date   WBC 8.1 06/27/2020   HGB 15.9 06/27/2020   HCT 47.9 (H) 06/27/2020   MCV 94 06/27/2020   PLT 243 06/27/2020   Obesity Behavioral Intervention:   Approximately 15 minutes were spent on the discussion below.  ASK: We discussed the diagnosis of obesity with [name] today and Tariyah agreed to give [name] permission to discuss obesity behavioral modification  therapy today.  ASSESS: Kealy has the diagnosis of obesity and her BMI today is 41.9. Jazmina is in the action stage of change.   ADVISE: Caysie was educated on the multiple health risks of obesity as well as the benefit of weight loss to improve her health. She was advised of the need for long term treatment and the importance of lifestyle modifications to improve her current health and to decrease her risk of future health problems.  AGREE: Multiple dietary modification options and treatment options were discussed and Dima agreed to follow the recommendations documented in the above note.  ARRANGE: Leeya was educated on the importance of frequent visits to treat obesity as outlined per CMS and USPSTF guidelines and agreed to schedule her next follow up appointment today.  Attestation Statements:   Reviewed by clinician on day of visit: allergies, medications, problem list, medical history, surgical history, family history, social history, and previous encounter notes.  I, Water quality scientist, CMA, am acting as Location manager for [employer], [place].  I have reviewed the above documentation for accuracy and completeness, and I agree with the above. -  [name], FNP

## 2021-02-17 ENCOUNTER — Encounter (INDEPENDENT_AMBULATORY_CARE_PROVIDER_SITE_OTHER): Payer: Self-pay | Admitting: Family Medicine

## 2021-02-24 ENCOUNTER — Other Ambulatory Visit: Payer: Self-pay | Admitting: Internal Medicine

## 2021-03-04 ENCOUNTER — Ambulatory Visit (INDEPENDENT_AMBULATORY_CARE_PROVIDER_SITE_OTHER): Payer: Medicare Other | Admitting: Cardiology

## 2021-03-04 ENCOUNTER — Encounter: Payer: Self-pay | Admitting: Cardiology

## 2021-03-04 ENCOUNTER — Other Ambulatory Visit: Payer: Self-pay

## 2021-03-04 VITALS — BP 138/90 | HR 61 | Ht 59.0 in | Wt 210.0 lb

## 2021-03-04 DIAGNOSIS — I480 Paroxysmal atrial fibrillation: Secondary | ICD-10-CM

## 2021-03-04 DIAGNOSIS — I2510 Atherosclerotic heart disease of native coronary artery without angina pectoris: Secondary | ICD-10-CM | POA: Diagnosis not present

## 2021-03-04 DIAGNOSIS — I10 Essential (primary) hypertension: Secondary | ICD-10-CM | POA: Diagnosis not present

## 2021-03-04 DIAGNOSIS — E785 Hyperlipidemia, unspecified: Secondary | ICD-10-CM | POA: Diagnosis not present

## 2021-03-04 DIAGNOSIS — I2584 Coronary atherosclerosis due to calcified coronary lesion: Secondary | ICD-10-CM

## 2021-03-04 NOTE — Progress Notes (Signed)
Date:  03/04/2021   ID:  Oaklyn, Jakubek 07-12-1956, MRN [id number]   PCP:  Burnis Medin, MD  Cardiologist:  Fransico Him, MD  Electrophysiologist:  None   Chief Complaint:  Coronary calcifications, HTN, HLD, DM  History of Present Illness:    Nichole Smith is a 65 y.o. 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 Cyndia Bent, HTN, OSA on CPAP followed by Dr. Daisey Must coronary artery calcifications on chest CT as well as calcifications of the aortic arch and carotid bifurcation. She had a nuclear stress test 08/2018 that was low risk with no ischemia.She underwent coronary Ca score and was very elevated at 1272.  She is here today for followup and is doing well.  She denies any chest pain or pressure, SOB, DOE, PND, orthopnea, LE edema, dizziness, palpitations or syncope. She is compliant with her meds and is tolerating meds with no SE.    Prior CV studies:   The following studies were reviewed today:  none  Past Medical History:  Diagnosis Date  . Arthritis    knees, back  . Bursitis    hips - almost gone per pt   . Cancer ([place])   . Carcinoid tumor of lung 2012   right found incidentally on chest xray eval for r/o vasxculitis   . Carpal tunnel syndrome, bilateral    bilat but surgery to correct   . Coronary artery calcification 07/23/2016  . Double vision    history, no current problem  . Dysrhythmia    irregular occassional, no current problem  . Frequent UTI   . HNP (herniated nucleus pulposus), cervical 01/22/2011   C6-C7  Has seen dr Vertell Limber for this and Dr Tonita Cong  Positional numbness in hands no weakness    . Hypertension   . Hypothyroidism   . Infertility, female   . 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 Obstructive sleep apnea 07/27/2014   NPSG 07/2014:  AHI 85/hr, optimal cpap 10cm.  Download 08/2014:  Good compliance, breakthru apnea on 10cm.  Changed to auto 5-12cm >> good control of AHI on f/u  download ONO on CPAP:      . Osteoarthritis   . PCOS (polycystic ovarian syndrome)   . Sleep apnea    wears cpap   . SUI (stress urinary incontinence, female)    h/o   Past Surgical History:  Procedure Laterality Date  . ANTERIOR CERVICAL DECOMP/DISCECTOMY FUSION  12/24/2017   Procedure: Cervical five-six Cervical six-seven Anterior cervical decompression/discectomy/fusion;  Surgeon: Erline Levine, MD;  Location: [place];  Service: Neurosurgery;;  . Wilmon Pali RELEASE Left 12/24/2017   Procedure: LEFT CARPAL TUNNEL RELEASE;  Surgeon: Erline Levine, MD;  Location: [place];  Service: Neurosurgery;  Laterality: Left;  . CESAREAN SECTION     x 1 - twins  . COLONOSCOPY    . DILATATION & CURRETTAGE/HYSTEROSCOPY WITH RESECTOCOPE N/A 07/18/2013   Procedure: DILATATION & CURETTAGE/HYSTEROSCOPY WITH RESECTOCOPE;  Surgeon: Lyman Speller, MD;  Location: [facility];  Service: Gynecology;  Laterality: N/A;  mass resection  . Lung tumor removed     Rt lung carcinoid  . MOUTH SURGERY     pre-cancerous ulcer removed  . POLYPECTOMY    . SHOULDER OPEN ROTATOR CUFF REPAIR  07/20/2010   Right  . WISDOM TOOTH EXTRACTION       Current Meds  Medication Sig  . aspirin EC 81 MG tablet Take 81 mg by  mouth at bedtime.  [name] atorvastatin (LIPITOR) 40 MG tablet Take 1 tablet (40 mg total) by mouth daily.  [name] augmented betamethasone dipropionate (DIPROLENE-AF) 0.05 % ointment Apply topically.  [name] CALCIUM PO Take 1 tablet by mouth at bedtime.   . chlorthalidone (HYGROTON) 25 MG tablet Take 1 tablet (25 mg total) by mouth daily.  . Cholecalciferol (VITAMIN D3) 50 MCG (2000 UT) TABS Take by mouth.  . diltiazem (CARDIZEM CD) 180 MG 24 hr capsule Take 1 capsule (180 mg total) by mouth daily. Please keep upcoming appt for future refills.  . Dulaglutide (TRULICITY) 3 TG/6.2IR SOPN Inject 3 mg as directed once a week.  . estradiol (ESTRACE VAGINAL) 0.1 MG/GM vaginal cream INSERT 1 GM VAGINALLY TWICE WEEKLY  .  methocarbamol (ROBAXIN) 500 MG tablet Take 1 tablet (500 mg total) by mouth 3 (three) times daily.  . metoprolol succinate (TOPROL-XL) 50 MG 24 hr tablet TAKE 1 TABLET DAILY. Please keep upcoming appt in April 2022 with Dr. Radford Pax for future refills. Thank you  . Multiple Vitamins-Minerals (PRESERVISION AREDS 2 PO) Take 1 tablet by mouth 2 (two) times daily.  . naproxen sodium (ALEVE) 220 MG tablet Take 220 mg by mouth 2 (two) times daily as needed (for pain.).  [name] SYNTHROID 137 MCG tablet TAKE 1 TABLET DAILY  . TURMERIC PO Take by mouth daily.  . Vibegron (GEMTESA) 75 MG TABS Take by mouth.     Allergies:   Neomycin and Ace inhibitors   Social History   Tobacco Use  . Smoking status: Never Smoker  . Smokeless tobacco: Never Used  Vaping Use  . Vaping Use: Never used  Substance Use Topics  . Alcohol use: Not Currently  . Drug use: No     Family Hx: The patient's family history includes Alcohol abuse in her father; Alcoholism in her mother; Coronary artery disease in her father; Diabetes in her brother; Hypertension in her brother, father, maternal uncle, and mother; Liver disease in her father; Obesity in her father; Parkinson's disease in her brother; Stroke in her mother; Sudden death in her mother. There is no history of Colon cancer, Rectal cancer, Stomach cancer, Colon polyps, or Esophageal cancer.  ROS:   Please see the history of present illness.     All other systems reviewed and are negative.   Labs/Other Tests and Data Reviewed:    Recent Labs: 06/27/2020: ALT 28; Hemoglobin 15.9; Platelets 243 07/31/2020: TSH 0.98 01/22/2021: BUN 24; Creatinine, Ser 0.68; Potassium 3.9; Sodium 140   Recent Lipid Panel Lab Results  Component Value Date/Time   CHOL 117 01/02/2020 11:55 AM   CHOL 110 11/03/2016 12:00 AM   TRIG 57 01/02/2020 11:55 AM   TRIG 54 11/03/2016 12:00 AM   HDL 53 01/02/2020 11:55 AM   HDL 48 (L) 11/03/2016 12:00 AM   CHOLHDL 2 06/07/2018 07:39 AM   LDLCALC  51 01/02/2020 11:55 AM   LDLCALC 49 11/03/2016 12:00 AM    Wt Readings from Last 3 Encounters:  03/04/21 210 lb (95.3 kg)  02/12/21 207 lb (93.9 kg)  01/23/21 208 lb (94.3 kg)    EKG was performed and showed NSR with LAFB  Objective:    Vital Signs:  BP 138/90   Pulse 61   Ht 4\' 11"  (1.499 m)   Wt 210 lb (95.3 kg)   LMP 02/12/2011   SpO2 96%   BMI 42.41 kg/m    GEN: Well nourished, well developed in no acute distress HEENT: Normal NECK: No  JVD; No carotid bruits LYMPHATICS: No lymphadenopathy CARDIAC:RRR, no murmurs, rubs, gallops RESPIRATORY:  Clear to auscultation without rales, wheezing or rhonchi  ABDOMEN: Soft, non-tender, non-distended MUSCULOSKELETAL:  No edema; No deformity  SKIN: Warm and dry NEUROLOGIC:  Alert and oriented x 3 PSYCHIATRIC:  Normal affect    ASSESSMENT & PLAN:    1.  Coronary artery calcifications/Aortic atherosclerosis -nuclear stress test showed no ischemia 01/2020 -coronary calcium score very high at 1272 -she denies any anginal symptoms -continue statin and ASA  2.  HTN -BP fairly well controlled  -continue Cardizem CD 180mg  daily, Chlorthalidone 25mg  daily, Toprol XL 50mg  daily -labs were personally reviewed from [facility] showing a Creatinine 0.64 and K+ 4.3 from 01/2020  3.  HLD -LDL goal < 70 -continue atorvastatin 40mg  daily -check NMR panel and ALT  4.  Morbid Obesity -she is continuing on her diet and exercise  5.  PAF -She has had palpitations for years but never been able to catch them -she has a [employer] and just had an event in February and showed atrial fibrillation for about 20 minutes -her CHADS2VASC 2 is 72 (female, HTN, age >88) -we discussed starting DOAC and would like to be seen in [place] clinic to discuss since her palpitations are very infrequent (last episode was over a year ago before her one this pas Feb)   Medication Adjustments/Labs and Tests Ordered: Current medicines are reviewed at length with  the patient today.  Concerns regarding medicines are outlined above.  Tests Ordered: No orders of the defined types were placed in this encounter.  Medication Changes: No orders of the defined types were placed in this encounter.   Disposition:  Follow up in 1 year(s)  Signed, Fransico Him, MD  03/04/2021 8:38 AM    [facility]

## 2021-03-04 NOTE — Patient Instructions (Signed)
Medication Instructions:  Your physician recommends that you continue on your current medications as directed. Please refer to the Current Medication list given to you today.  *If you need a refill on your cardiac medications before your next appointment, please call your pharmacy*   Lab Work: TODAY: NMR and ALT If you have labs (blood work) drawn today and your tests are completely normal, you will receive your results only by: [name] MyChart Message (if you have MyChart) OR . A paper copy in the mail If you have any lab test that is abnormal or we need to change your treatment, we will call you to review the results.  Follow-Up: At [facility], you and your health needs are our priority.  As part of our continuing mission to provide you with exceptional heart care, we have created designated Provider Care Teams.  These Care Teams include your primary Cardiologist (physician) and Advanced Practice Providers (APPs -  Physician Assistants and Nurse Practitioners) who all work together to provide you with the care you need, when you need it.  We recommend signing up for the patient portal called "MyChart".  Sign up information is provided on this After Visit Summary.  MyChart is used to connect with patients for Virtual Visits (Telemedicine).  Patients are able to view lab/test results, encounter notes, upcoming appointments, etc.  Non-urgent messages can be sent to your provider as well.   To learn more about what you can do with MyChart, go to [email or web address].    Your next appointment:   1 year(s)  The format for your next appointment:   In Person  Provider:   You may see Fransico Him, MD or one of the following Advanced Practice Providers on your designated Care Team:    Melina Copa, PA-C  Ermalinda Barrios, PA-C   Other Instructions You have been referred to see the Atrial Fibrillation Clinic  AFIB CLINIC INFORMATION: The AFib Clinic is located in the Heart and Vascular  Specialty Clinics at [facility]. Parking instructions/directions: [address] (off [address]). When you pull in to Entrance C, there is an underground parking garage to your right. Take the elevators to the first floor. Follow the signs to the Heart and Vascular Specialty Clinics. You will see registration at the end of the hallway.  Phone number: [phone]

## 2021-03-04 NOTE — Addendum Note (Signed)
Addended by: Antonieta Iba on: 03/04/2021 08:42 AM   Modules accepted: Orders

## 2021-03-04 NOTE — Addendum Note (Signed)
Addended by: Antonieta Iba on: 03/04/2021 04:05 PM   Modules accepted: Orders

## 2021-03-05 ENCOUNTER — Encounter (INDEPENDENT_AMBULATORY_CARE_PROVIDER_SITE_OTHER): Payer: Self-pay | Admitting: Adult Health

## 2021-03-05 ENCOUNTER — Ambulatory Visit (INDEPENDENT_AMBULATORY_CARE_PROVIDER_SITE_OTHER): Payer: Medicare Other | Admitting: Adult Health

## 2021-03-05 VITALS — BP 125/76 | HR 70 | Temp 98.4°F | Ht 59.0 in | Wt 207.0 lb

## 2021-03-05 DIAGNOSIS — E8881 Metabolic syndrome: Secondary | ICD-10-CM | POA: Diagnosis not present

## 2021-03-05 DIAGNOSIS — Z6841 Body Mass Index (BMI) 40.0 and over, adult: Secondary | ICD-10-CM

## 2021-03-05 DIAGNOSIS — I10 Essential (primary) hypertension: Secondary | ICD-10-CM

## 2021-03-05 DIAGNOSIS — I2584 Coronary atherosclerosis due to calcified coronary lesion: Secondary | ICD-10-CM | POA: Diagnosis not present

## 2021-03-05 DIAGNOSIS — I2510 Atherosclerotic heart disease of native coronary artery without angina pectoris: Secondary | ICD-10-CM

## 2021-03-05 LAB — NMR, LIPOPROFILE
Cholesterol, Total: 131 mg/dL (ref 100–199)
HDL Particle Number: 38.6 umol/L (ref >=30.5)
HDL-C: 59 mg/dL (ref >39)
LDL Particle Number: 865 nmol/L (ref <1000)
LDL Size: 21.7 nm (ref >20.5)
LDL-C (NIH Calc): 60 mg/dL (ref 0–99)
LP-IR Score: 29 (ref <=45)
Small LDL Particle Number: 354 nmol/L (ref <=527)
Triglycerides: 57 mg/dL (ref 0–149)

## 2021-03-05 LAB — ALT: ALT: 26 IU/L (ref 0–32)

## 2021-03-06 NOTE — Progress Notes (Signed)
Chief Complaint:   OBESITY Nichole Smith is here to discuss her progress with her obesity treatment plan along with follow-up of her obesity related diagnoses. [name] is on keeping a food journal and adhering to recommended goals of 1400-1500 calories and 95 protein and states she is following her eating plan approximately 50% of the time. [name] states she is walking, strength training, and pool 30 minutes 7 times per week.  Today's visit was #: 53 Starting weight: 238 lbs Starting date: 01/12/2019 Today's weight: 207 lbs Today's date: 03/05/2021 Total lbs lost to date: 31 lbs Total lbs lost since last in-office visit: 0  Interim History: When she tracks intake, [name] will consume on average 1529 cal/day and 97-138 grams of protein/day. She tracks intake 100% of the time. Her Trulicity dose was increased to 3 mg once a week. She reports chronic constipation, no increase sx's since using GLP-1.  Subjective:   1. Essential hypertension Nichole Smith's BP/HR are stable at OV. She is on [place] 25 mg QD, diltiazem 180 mg QD, and metoprolol succinate 50 mg QD.  BP Readings from Last 3 Encounters:  03/05/21 125/76  03/04/21 138/90  02/12/21 135/70    2. Insulin resistance Hanaan's last 2 insulin levels have been above goal of 5. She is on Trulicity 3 mg once a week and tolerating it well.  Lab Results  Component Value Date   INSULIN 13.7 06/27/2020   INSULIN 13.5 01/02/2020   INSULIN 21.5 08/22/2019   INSULIN 20.4 01/12/2019   Lab Results  Component Value Date   HGBA1C 5.4 06/27/2020    Assessment/Plan:   1. Essential hypertension Nichole Smith is working on healthy weight loss and exercise to improve blood pressure control. We will watch for signs of hypotension as she continues her lifestyle modifications. Continue current anti-hypertensive regimen.  2. Insulin resistance Nichole Smith will continue to work on weight loss, exercise, and decreasing simple carbohydrates to help decrease the risk of  diabetes. Nichole Smith agreed to follow-up with [name] as directed to closely monitor her progress. Continue GLP-1 and journaling program.  3. Class 3 severe obesity with serious comorbidity and body mass index (BMI) of 40.0 to 44.9 in adult, unspecified obesity type (HCC) Nichole Smith is currently in the action stage of change. As such, her goal is to continue with weight loss efforts. She has agreed to keeping a food journal and adhering to recommended goals of 1400-1500 calories and 95 g protein.   Exercise goals: As is  Behavioral modification strategies: increasing lean protein intake, decreasing simple carbohydrates, no skipping meals, meal planning and cooking strategies, planning for success and keeping a strict food journal.  Song has agreed to follow-up with our clinic in 4 weeks. She was informed of the importance of frequent follow-up visits to maximize her success with intensive lifestyle modifications for her multiple health conditions.   Objective:   Blood pressure 125/76, pulse 70, temperature 98.4 F (36.9 C), height 4\' 11"  (1.499 m), weight 207 lb (93.9 kg), last menstrual period 02/12/2011, SpO2 95 %. Body mass index is 41.81 kg/m.  General: Cooperative, alert, well developed, in no acute distress. HEENT: Conjunctivae and lids unremarkable. Cardiovascular: Regular rhythm.  Lungs: Normal work of breathing. Neurologic: No focal deficits.   Lab Results  Component Value Date   CREATININE 0.68 01/22/2021   BUN 24 01/22/2021   NA 140 01/22/2021   K 3.9 01/22/2021   CL 98 01/22/2021   CO2 27 01/22/2021   Lab Results  Component Value Date  ALT 26 03/04/2021   AST 27 06/27/2020   ALKPHOS 74 06/27/2020   BILITOT 0.4 06/27/2020   Lab Results  Component Value Date   HGBA1C 5.4 06/27/2020   HGBA1C 5.5 01/02/2020   HGBA1C 5.6 08/22/2019   HGBA1C 5.9 (H) 01/12/2019   HGBA1C 5.9 06/07/2018   Lab Results  Component Value Date   INSULIN 13.7 06/27/2020   INSULIN 13.5 01/02/2020    INSULIN 21.5 08/22/2019   INSULIN 20.4 01/12/2019   Lab Results  Component Value Date   TSH 0.98 07/31/2020   Lab Results  Component Value Date   CHOL 117 01/02/2020   HDL 53 01/02/2020   LDLCALC 51 01/02/2020   TRIG 57 01/02/2020   CHOLHDL 2 06/07/2018   Lab Results  Component Value Date   WBC 8.1 06/27/2020   HGB 15.9 06/27/2020   HCT 47.9 (H) 06/27/2020   MCV 94 06/27/2020   PLT 243 06/27/2020    Attestation Statements:   Reviewed by clinician on day of visit: allergies, medications, problem list, medical history, surgical history, family history, social history, and previous encounter notes.  Time spent on visit including pre-visit chart review and post-visit care and charting was 32 minutes.   Coral Ceo, am acting as Location manager for Mina Marble, NP.  I have reviewed the above documentation for accuracy and completeness, and I agree with the above. -  Tenea Sens d. Mikayla Chiusano, NP-C

## 2021-03-13 ENCOUNTER — Other Ambulatory Visit: Payer: Self-pay

## 2021-03-13 ENCOUNTER — Ambulatory Visit (INDEPENDENT_AMBULATORY_CARE_PROVIDER_SITE_OTHER): Payer: Medicare Other | Admitting: Obstetrics & Gynecology

## 2021-03-13 ENCOUNTER — Encounter (HOSPITAL_BASED_OUTPATIENT_CLINIC_OR_DEPARTMENT_OTHER): Payer: Self-pay | Admitting: Obstetrics & Gynecology

## 2021-03-13 ENCOUNTER — Other Ambulatory Visit (HOSPITAL_BASED_OUTPATIENT_CLINIC_OR_DEPARTMENT_OTHER)
Admission: RE | Admit: 2021-03-13 | Discharge: 2021-03-13 | Disposition: A | Discharge status: Home / Self Care | Payer: Medicare Other | Source: Ambulatory Visit | Attending: Obstetrics & Gynecology | Admitting: Obstetrics & Gynecology

## 2021-03-13 VITALS — BP 124/72 | Ht 59.0 in | Wt 212.0 lb

## 2021-03-13 DIAGNOSIS — I2584 Coronary atherosclerosis due to calcified coronary lesion: Secondary | ICD-10-CM

## 2021-03-13 DIAGNOSIS — N3281 Overactive bladder: Secondary | ICD-10-CM | POA: Diagnosis not present

## 2021-03-13 DIAGNOSIS — I2510 Atherosclerotic heart disease of native coronary artery without angina pectoris: Secondary | ICD-10-CM

## 2021-03-13 DIAGNOSIS — N3000 Acute cystitis without hematuria: Secondary | ICD-10-CM

## 2021-03-13 MED ORDER — SULFAMETHOXAZOLE-TRIMETHOPRIM 800-160 MG PO TABS: 1.0000 | ORAL_TABLET | Freq: Two times a day (BID) | ORAL | 1 refills | Status: DC

## 2021-03-13 NOTE — Progress Notes (Signed)
GYNECOLOGY  VISIT  CC:   Follow up after UTI  HPI: 65 y.o. G2P2 Married White or Caucasian female here for follow up after having a UTI.  She reports this started a little more atypically this time.  She started with some mild change in temperature by 1 degree, vaginal irritation and itching symptoms, some groin pains, rectal pressure, and then a lot of fatigue.  This occurred for about a week and gradually progressed until she started having the more typical symptoms.  She had bactrim ds bid x 3 days.  She feels like all of her symptoms have resolved.    Has seen Dr. Alan Mulder.  She has recently started on Gemtesa and this has really helped.  Does have follow up in 3 weeks.    Does need a refill for antibiotics in case has UTI with travel or on the weekend.    She questions why this has been a bigger issues these past few years.  Prolapse and estrogen changes discussed.  Pt and I also discussed the differences between stress incontinence and OAB.  She has considered some surgical repair but understands that is likely not going to make a big difference with her urgency as this is OAB and the [place] is really helping.  Patient Active Problem List   Diagnosis Date Noted  . Healthcare maintenance 12/25/2020  . Insulin resistance 03/05/2020  . Vitamin D deficiency 08/25/2019  . Pre-diabetes 08/25/2019  . Acute pain of right knee 09/28/2018  . Lichen planus 72/53/6644  . Cervical stenosis of spinal canal 12/24/2017  . Hyperlipidemia LDL goal <70 08/26/2017  . Heart palpitations 08/26/2017  . Hammertoe of right foot 01/06/2017  . Coronary artery calcification 07/23/2016  . Periodic limb movements of sleep 06/18/2016  . Atherosclerotic plaque 06/11/2016  . Piriformis syndrome of left side 10/24/2015  . Loss of transverse plantar arch of left foot 09/12/2015  . 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 Obstructive sleep apnea 07/27/2014  . Midline low back pain without sciatica 06/20/2014  .  Polycythemia, secondary 03/22/2014  . Severe obesity (BMI >= 40) ([place]) 02/22/2014  . Elevated hemoglobin ([place]) 02/22/2014  . Left shoulder pain 02/04/2013  . 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 HNP (herniated nucleus pulposus), cervical 01/22/2011  . Visit for preventive health examination 01/22/2011  . PAIN IN JOINT, MULTIPLE SITES 01/21/2010  . Hypothyroidism 10/16/2008  . Class 3 severe obesity with serious comorbidity and body mass index (BMI) of 40.0 to 44.9 in adult ([place]) 01/07/2008  . PALPITATIONS, RECURRENT 01/07/2008  . Essential hypertension 06/25/2007  . OSTEOARTHRITIS 06/25/2007    Past Medical History:  Diagnosis Date  . Arthritis    knees, back  . Bursitis    hips - almost gone per pt   . Cancer ([place])   . Carcinoid tumor of lung 2012   right found incidentally on chest xray eval for r/o vasxculitis   . Carpal tunnel syndrome, bilateral    bilat but surgery to correct   . Coronary artery calcification 07/23/2016  . Double vision    history, no current problem  . Dysrhythmia    irregular occassional, no current problem  . Frequent UTI   . HNP (herniated nucleus pulposus), cervical 01/22/2011   C6-C7  Has seen dr Vertell Limber for this and Dr Tonita Cong  Positional numbness in hands no weakness    . Hypertension   . Hypothyroidism   . Infertility, female   . 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 Obstructive sleep  apnea 07/27/2014   NPSG 07/2014:  AHI 85/hr, optimal cpap 10cm.  Download 08/2014:  Good compliance, breakthru apnea on 10cm.  Changed to auto 5-12cm >> good control of AHI on f/u download ONO on CPAP:      . Osteoarthritis   . PCOS (polycystic ovarian syndrome)   . Sleep apnea    wears cpap   . SUI (stress urinary incontinence, female)    h/o    Past Surgical History:  Procedure Laterality Date  . ANTERIOR CERVICAL DECOMP/DISCECTOMY FUSION  12/24/2017   Procedure: Cervical five-six Cervical six-seven Anterior cervical decompression/discectomy/fusion;   Surgeon: Erline Levine, MD;  Location: [facility];  Service: Neurosurgery;;  . Wilmon Pali RELEASE Left 12/24/2017   Procedure: LEFT CARPAL TUNNEL RELEASE;  Surgeon: Erline Levine, MD;  Location: [facility];  Service: Neurosurgery;  Laterality: Left;  . CESAREAN SECTION     x 1 - twins  . COLONOSCOPY    . DILATATION & CURRETTAGE/HYSTEROSCOPY WITH RESECTOCOPE N/A 07/18/2013   Procedure: DILATATION & CURETTAGE/HYSTEROSCOPY WITH RESECTOCOPE;  Surgeon: Lyman Speller, MD;  Location: [facility];  Service: Gynecology;  Laterality: N/A;  mass resection  . Lung tumor removed     Rt lung carcinoid  . MOUTH SURGERY     pre-cancerous ulcer removed  . POLYPECTOMY    . SHOULDER OPEN ROTATOR CUFF REPAIR  07/20/2010   Right  . WISDOM TOOTH EXTRACTION      MEDS:   Current Outpatient Medications on File Prior to Visit  Medication Sig Dispense Refill  . aspirin EC 81 MG tablet Take 81 mg by mouth at bedtime.    [name] atorvastatin (LIPITOR) 40 MG tablet Take 1 tablet (40 mg total) by mouth daily. 90 tablet 0  . augmented betamethasone dipropionate (DIPROLENE-AF) 0.05 % ointment Apply topically.    [name] CALCIUM PO Take 1 tablet by mouth at bedtime.     . chlorthalidone (HYGROTON) 25 MG tablet Take 1 tablet (25 mg total) by mouth daily. 90 tablet 3  . Cholecalciferol (VITAMIN D3) 50 MCG (2000 UT) TABS Take by mouth.    . diltiazem (CARDIZEM CD) 180 MG 24 hr capsule Take 1 capsule (180 mg total) by mouth daily. Please keep upcoming appt for future refills. 90 capsule 0  . Dulaglutide (TRULICITY) 3 IR/5.1OA SOPN Inject 3 mg as directed once a week. 6 mL 0  . estradiol (ESTRACE VAGINAL) 0.1 MG/GM vaginal cream INSERT 1 GM VAGINALLY TWICE WEEKLY 42.5 g 3  . methocarbamol (ROBAXIN) 500 MG tablet Take 1 tablet (500 mg total) by mouth 3 (three) times daily. 90 tablet 0  . metoprolol succinate (TOPROL-XL) 50 MG 24 hr tablet TAKE 1 TABLET DAILY. Please keep upcoming appt in April 2022 with Dr. Radford Pax for future refills. Thank you  90 tablet 0  . Multiple Vitamins-Minerals (PRESERVISION AREDS 2 PO) Take 1 tablet by mouth 2 (two) times daily.    . naproxen sodium (ALEVE) 220 MG tablet Take 220 mg by mouth 2 (two) times daily as needed (for pain.).    [name] SYNTHROID 137 MCG tablet TAKE 1 TABLET DAILY 90 tablet 0  . TURMERIC PO Take by mouth daily.    . Vibegron (GEMTESA) 75 MG TABS Take by mouth.     No current facility-administered medications on file prior to visit.    ALLERGIES: Neomycin and Ace inhibitors  Family History  Problem Relation Age of Onset  . Stroke Mother        died age 46  .  Hypertension Mother   . Sudden death Mother   . Alcoholism Mother   . Coronary artery disease Father        died age 59  . Hypertension Father   . Liver disease Father   . Alcohol abuse Father   . Obesity Father   . Parkinson's disease Brother   . Diabetes Brother   . Hypertension Brother   . Hypertension Maternal Uncle   . Colon cancer Neg Hx   . Rectal cancer Neg Hx   . Stomach cancer Neg Hx   . Colon polyps Neg Hx   . Esophageal cancer Neg Hx     SH:  Married, non smoker  Review of Systems  Constitutional: Negative.   Genitourinary: Negative.     PHYSICAL EXAMINATION:    BP 124/72 (Cuff Size: Large)   Ht 4\' 11"  (1.499 m)   Wt 212 lb (96.2 kg)   LMP 02/12/2011   BMI 42.82 kg/m     Physical Exam Constitutional:      Appearance: Normal appearance.  Neurological:     General: No focal deficit present.     Mental Status: She is alert.  Psychiatric:        Mood and Affect: Mood normal.    Assessment/Plan: 1. Acute cystitis without hematuria - sulfamethoxazole-trimethoprim (BACTRIM DS) 800-160 MG tablet; Take 1 tablet by mouth 2 (two) times daily.  Dispense: 6 tablet; Refill: 1 - Urine Culture; Future  2. OAB (overactive bladder) - on Gemtesa - has follow up with Dr. Matilde Sprang

## 2021-03-14 LAB — URINE CULTURE: Culture: NO GROWTH

## 2021-03-25 ENCOUNTER — Other Ambulatory Visit: Payer: Self-pay

## 2021-03-25 ENCOUNTER — Ambulatory Visit (HOSPITAL_COMMUNITY)
Admission: RE | Admit: 2021-03-25 | Discharge: 2021-03-25 | Disposition: A | Discharge status: Home / Self Care | Payer: Medicare Other | Source: Ambulatory Visit | Attending: Nurse Practitioner | Admitting: Nurse Practitioner

## 2021-03-25 ENCOUNTER — Encounter (HOSPITAL_COMMUNITY): Payer: Self-pay | Admitting: Nurse Practitioner

## 2021-03-25 VITALS — BP 128/78 | HR 63 | Ht 59.0 in | Wt 212.0 lb

## 2021-03-25 DIAGNOSIS — I10 Essential (primary) hypertension: Secondary | ICD-10-CM | POA: Diagnosis not present

## 2021-03-25 DIAGNOSIS — Z7982 Long term (current) use of aspirin: Secondary | ICD-10-CM | POA: Diagnosis not present

## 2021-03-25 DIAGNOSIS — I480 Paroxysmal atrial fibrillation: Secondary | ICD-10-CM | POA: Diagnosis not present

## 2021-03-25 DIAGNOSIS — G4730 Sleep apnea, unspecified: Secondary | ICD-10-CM | POA: Insufficient documentation

## 2021-03-25 DIAGNOSIS — E669 Obesity, unspecified: Secondary | ICD-10-CM | POA: Diagnosis not present

## 2021-03-25 DIAGNOSIS — I2510 Atherosclerotic heart disease of native coronary artery without angina pectoris: Secondary | ICD-10-CM | POA: Diagnosis not present

## 2021-03-25 DIAGNOSIS — E785 Hyperlipidemia, unspecified: Secondary | ICD-10-CM | POA: Insufficient documentation

## 2021-03-25 DIAGNOSIS — Z79899 Other long term (current) drug therapy: Secondary | ICD-10-CM | POA: Insufficient documentation

## 2021-03-25 NOTE — Progress Notes (Signed)
Primary Care Physician: Burnis Medin, MD Referring Physician: Dr. Amedeo Kinsman FOY MUNGIA is a 65 y.o. female with a h/o obesity,  CAD calcification, HTN, HLD, that is in the afib clinic on referral of Dr. Radford Pax to be considered for anticoagulation for paucity of afib episodes. Pt noted rapid heartbeat episode starting in July 2015 that lasted for 3 hours. Other episodes occurred 11/09/2014 for 2 hours, 05/08/15 for 1 hour, 01/27/18 for 1 hour, 11/14/18 for 45 mins, 02/05/19 for 1 hour. 02/05/19 for 1 hour , 04/27/19 for 1 hour, 11/28/19 for 20 mins and 01/10/21 for 1 hour, which pt had bought a Kardia by that point, documented the arrhythmia and sent to Dr. Radford Pax via My Chart which did show  afib with v rates in the 170's. CHA2DS2VASc score is at least 4. She is on diltiazem and metoprolol for rate control.   Pt is concerned re start of  anticoagulation because she has arthritis and wants to go on meds like Celebrex for symptom relief which would  increase  her risk for bleeding.  She does wear CPAP, no excessive caffeine, tobacco or alcohol use. She does exercise on a regular basis.   Today, she denies symptoms of palpitations, chest pain, shortness of breath, orthopnea, PND, lower extremity edema, dizziness, presyncope, syncope, or neurologic sequela. The patient is tolerating medications without difficulties and is otherwise without complaint today.   Past Medical History:  Diagnosis Date  . Arthritis    knees, back  . Bursitis    hips - almost gone per pt   . Cancer ([place])   . Carcinoid tumor of lung 2012   right found incidentally on chest xray eval for r/o vasxculitis   . Carpal tunnel syndrome, bilateral    bilat but surgery to correct   . Coronary artery calcification 07/23/2016  . Double vision    history, no current problem  . Dysrhythmia    irregular occassional, no current problem  . Frequent UTI   . HNP (herniated nucleus pulposus), cervical 01/22/2011   C6-C7  Has seen dr  Vertell Limber for this and Dr Tonita Cong  Positional numbness in hands no weakness    . Hypertension   . Hypothyroidism   . Infertility, female   . 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 Obstructive sleep apnea 07/27/2014   NPSG 07/2014:  AHI 85/hr, optimal cpap 10cm.  Download 08/2014:  Good compliance, breakthru apnea on 10cm.  Changed to auto 5-12cm >> good control of AHI on f/u download ONO on CPAP:      . Osteoarthritis   . PCOS (polycystic ovarian syndrome)   . Sleep apnea    wears cpap   . SUI (stress urinary incontinence, female)    h/o   Past Surgical History:  Procedure Laterality Date  . ANTERIOR CERVICAL DECOMP/DISCECTOMY FUSION  12/24/2017   Procedure: Cervical five-six Cervical six-seven Anterior cervical decompression/discectomy/fusion;  Surgeon: Erline Levine, MD;  Location: [facility];  Service: Neurosurgery;;  . Wilmon Pali RELEASE Left 12/24/2017   Procedure: LEFT CARPAL TUNNEL RELEASE;  Surgeon: Erline Levine, MD;  Location: [facility];  Service: Neurosurgery;  Laterality: Left;  . CESAREAN SECTION     x 1 - twins  . COLONOSCOPY    . DILATATION & CURRETTAGE/HYSTEROSCOPY WITH RESECTOCOPE N/A 07/18/2013   Procedure: DILATATION & CURETTAGE/HYSTEROSCOPY WITH RESECTOCOPE;  Surgeon: Lyman Speller, MD;  Location: [facility];  Service: Gynecology;  Laterality: N/A;  mass resection  . Lung tumor removed  Rt lung carcinoid  . MOUTH SURGERY     pre-cancerous ulcer removed  . POLYPECTOMY    . SHOULDER OPEN ROTATOR CUFF REPAIR  07/20/2010   Right  . WISDOM TOOTH EXTRACTION      Current Outpatient Medications  Medication Sig Dispense Refill  . aspirin EC 81 MG tablet Take 81 mg by mouth at bedtime.    [name] atorvastatin (LIPITOR) 40 MG tablet Take 1 tablet (40 mg total) by mouth daily. 90 tablet 0  . augmented betamethasone dipropionate (DIPROLENE-AF) 0.05 % ointment Apply topically.    [name] CALCIUM PO Take 1 tablet by mouth at bedtime.     . chlorthalidone (HYGROTON) 25 MG tablet Take 1  tablet (25 mg total) by mouth daily. 90 tablet 3  . Cholecalciferol (VITAMIN D3) 50 MCG (2000 UT) TABS Take by mouth.    . diltiazem (CARDIZEM CD) 180 MG 24 hr capsule Take 1 capsule (180 mg total) by mouth daily. Please keep upcoming appt for future refills. 90 capsule 0  . Dulaglutide (TRULICITY) 3 VW/0.9WJ SOPN Inject 3 mg as directed once a week. 6 mL 0  . estradiol (ESTRACE VAGINAL) 0.1 MG/GM vaginal cream INSERT 1 GM VAGINALLY TWICE WEEKLY 42.5 g 3  . methocarbamol (ROBAXIN) 500 MG tablet Take 1 tablet (500 mg total) by mouth 3 (three) times daily. 90 tablet 0  . metoprolol succinate (TOPROL-XL) 50 MG 24 hr tablet TAKE 1 TABLET DAILY. Please keep upcoming appt in April 2022 with Dr. Radford Pax for future refills. Thank you 90 tablet 0  . Multiple Vitamins-Minerals (PRESERVISION AREDS 2 PO) Take 1 tablet by mouth 2 (two) times daily.    . naproxen sodium (ALEVE) 220 MG tablet Take 220 mg by mouth 2 (two) times daily as needed (for pain.).    [name] SYNTHROID 137 MCG tablet TAKE 1 TABLET DAILY 90 tablet 0  . TURMERIC PO Take by mouth daily.    . Vibegron (GEMTESA) 75 MG TABS Take by mouth.     No current facility-administered medications for this encounter.    Allergies  Allergen Reactions  . Neomycin Other (See Comments)    Inflammation--eye drop   . Ace Inhibitors Cough    Social History   Socioeconomic History  . Marital status: Married    Spouse name: [name]  . Number of children: 2  . Years of education: Not on file  . Highest education level: Not on file  Occupational History  . 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 Smoking status: Never Smoker  . Smokeless tobacco: Never Used  Vaping Use  . Vaping Use: Never used  Substance and Sexual Activity  . Alcohol use: Not Currently  . Drug use: No  . Sexual activity: Not Currently    Partners: Male    Birth control/protection: Post-menopausal  Other Topics Concern  . Not on file   Social History Narrative   hhof 2    2 Children at college and  beyond      [name]    Married    Retired age 1 2015   Recently moved taking care of grandchildren during the week.   Social Determinants of Health   Financial Resource Strain: Low Risk   . Difficulty of Paying Living Expenses: Not hard at all  Food Insecurity: No Food Insecurity  . Worried About Charity fundraiser in the Last Year: Never true  . Ran Out of Food in the Last  Year: Never true  Transportation Needs: No Transportation Needs  . Lack of Transportation (Medical): No  . Lack of Transportation (Non-Medical): No  Physical Activity: Sufficiently Active  . Days of Exercise per Week: 6 days  . Minutes of Exercise per Session: 30 min  Stress: No Stress Concern Present  . Feeling of Stress : Not at all  Social Connections: Socially Integrated  . Frequency of Communication with Friends and Family: More than three times a week  . Frequency of Social Gatherings with Friends and Family: More than three times a week  . Attends Religious Services: More than 4 times per year  . Active Member of Clubs or Organizations: Yes  . Attends Archivist Meetings: More than 4 times per year  . Marital Status: Married  Human resources officer Violence: Not At Risk  . Fear of Current or Ex-Partner: No  . Emotionally Abused: No  . Physically Abused: No  . Sexually Abused: No    Family History  Problem Relation Age of Onset  . Stroke Mother        died age 67  . Hypertension Mother   . Sudden death Mother   . Alcoholism Mother   . Coronary artery disease Father        died age 45  . Hypertension Father   . Liver disease Father   . Alcohol abuse Father   . Obesity Father   . Parkinson's disease Brother   . Diabetes Brother   . Hypertension Brother   . Hypertension Maternal Uncle   . Colon cancer Neg Hx   . Rectal cancer Neg Hx   . Stomach cancer Neg Hx   . Colon polyps Neg Hx   . Esophageal cancer Neg  Hx     ROS- All systems are reviewed and negative except as per the HPI above  Physical Exam: Vitals:   03/25/21 1058  BP: 128/78  Pulse: 63  Weight: 96.2 kg  Height: 4\' 11"  (1.499 m)   Wt Readings from Last 3 Encounters:  03/25/21 96.2 kg  03/13/21 96.2 kg  03/05/21 93.9 kg    Labs: Lab Results  Component Value Date   NA 140 01/22/2021   K 3.9 01/22/2021   CL 98 01/22/2021   CO2 27 01/22/2021   GLUCOSE 89 01/22/2021   BUN 24 01/22/2021   CREATININE 0.68 01/22/2021   CALCIUM 9.7 01/22/2021   Lab Results  Component Value Date   INR 0.96 06/11/2011   Lab Results  Component Value Date   CHOL 117 01/02/2020   HDL 53 01/02/2020   LDLCALC 51 01/02/2020   TRIG 57 01/02/2020     GEN- The patient is well appearing, alert and oriented x 3 today.   Head- normocephalic, atraumatic Eyes-  Sclera clear, conjunctiva pink Ears- hearing intact Oropharynx- clear Neck- supple, no JVP Lymph- no cervical lymphadenopathy Lungs- Clear to ausculation bilaterally, normal work of breathing Heart- Regular rate and rhythm, no murmurs, rubs or gallops, PMI not laterally displaced GI- soft, NT, ND, + BS Extremities- no clubbing, cyanosis, or edema MS- no significant deformity or atrophy Skin- no rash or lesion Psych- euthymic mood, full affect Neuro- strength and sensation are intact  EKG- NSR at 61 bpm, pr int 148 ms, qrs int 94 ms, qtc 411 ms   Rhythm strips reviewed form My chart message thst show afib at 167 bpm    Assessment and Plan: 1. afib  Has had short episodes of palpitations for years Caught  by Jodelle Red in February of 2022,  afib with RVR, 1 hour duration Continue diltaizem and metoprolol at current doses  Has not noted any episodes since February  2. CHA2DS2VASc of 4 As documented in the HPI, paucity of episodes with short duration Pt is questioning if she needs anticoagulation with episodes that can be a year apart    We discussed implanting a LINQ and pt is  in favor to understand true burden of afib She would like to go on Celebrex for her arthritis and understands that  would place her at increased risk for bleeding if on anticoagulation If linq shows a greater burden for afib than the pt appreciates, then possibly could be a Watchman candidate, if NSAIDS still a concern   3. Sleep apnea Continue  cpap  Will refer her to Dr. Rayann Heman to further discuss Linq implantation   [place]. Abdulkadir Emmanuel, [facility] [address] [place], [place] [address] [phone]

## 2021-03-26 ENCOUNTER — Other Ambulatory Visit (INDEPENDENT_AMBULATORY_CARE_PROVIDER_SITE_OTHER): Payer: Self-pay | Admitting: Family Medicine

## 2021-03-26 DIAGNOSIS — R7303 Prediabetes: Secondary | ICD-10-CM

## 2021-03-26 NOTE — Telephone Encounter (Signed)
Refill request

## 2021-03-27 NOTE — Telephone Encounter (Signed)
Ok x 1

## 2021-04-02 ENCOUNTER — Other Ambulatory Visit: Payer: Self-pay | Admitting: Cardiology

## 2021-04-02 ENCOUNTER — Ambulatory Visit (INDEPENDENT_AMBULATORY_CARE_PROVIDER_SITE_OTHER): Payer: Medicare Other | Admitting: Adult Health

## 2021-04-02 ENCOUNTER — Other Ambulatory Visit: Payer: Self-pay

## 2021-04-02 ENCOUNTER — Encounter (INDEPENDENT_AMBULATORY_CARE_PROVIDER_SITE_OTHER): Payer: Self-pay | Admitting: Adult Health

## 2021-04-02 VITALS — BP 110/71 | HR 73 | Temp 98.7°F | Ht 59.0 in | Wt 211.0 lb

## 2021-04-02 DIAGNOSIS — I10 Essential (primary) hypertension: Secondary | ICD-10-CM | POA: Diagnosis not present

## 2021-04-02 DIAGNOSIS — R7303 Prediabetes: Secondary | ICD-10-CM

## 2021-04-02 DIAGNOSIS — I2584 Coronary atherosclerosis due to calcified coronary lesion: Secondary | ICD-10-CM

## 2021-04-02 DIAGNOSIS — I2510 Atherosclerotic heart disease of native coronary artery without angina pectoris: Secondary | ICD-10-CM

## 2021-04-02 DIAGNOSIS — I480 Paroxysmal atrial fibrillation: Secondary | ICD-10-CM | POA: Diagnosis not present

## 2021-04-02 DIAGNOSIS — Z6841 Body Mass Index (BMI) 40.0 and over, adult: Secondary | ICD-10-CM

## 2021-04-02 DIAGNOSIS — E66813 Obesity, class 3: Secondary | ICD-10-CM

## 2021-04-03 NOTE — Progress Notes (Signed)
Hailey Black Jack Nichole Smith Phone: [phone] Subjective:   [name], am serving as a scribe for Dr. Hulan Saas. This visit occurred during the SARS-CoV-2 public health emergency.  Safety protocols were in place, including screening questions prior to the visit, additional usage of staff PPE, and extensive cleaning of exam room while observing appropriate contact time as indicated for disinfecting solutions.   I'm seeing this patient by the request  of:  Panosh, Standley Brooking, MD  CC: hip and leg pain   PNT:[id number]  Nichole Smith is a 65 y.o. female coming in with complaint of R hip and leg pain. Last seen in 2018 for foot pain. Cervical fusion in 2019 by Dr. Vertell Limber. Pan in R SI joint that radiates into the hamstring and Lateral hip. Saw Dr. Georgina Snell in July 2021 and did some physical therapy which helped. Massage therapist said that hip musculature is tight. Pain is increasing recently and stair climbing incresae her pain. Does stretch at home. Negative hip xray in 05/2020. With sitting she does get pain in distal glutes, bilaterally.    In October 2016, patient had bursitis and piriformis issues on L side. Using lift in L heel since this visit.   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 Is being treated by another physician.       Past Medical History:  Diagnosis Date  . Arthritis    knees, back  . Bursitis    hips - almost gone per pt   . Cancer ([place])   . Carcinoid tumor of lung 2012   right found incidentally on chest xray eval for r/o vasxculitis   . Carpal tunnel syndrome, bilateral    bilat but surgery to correct   . Coronary artery calcification 07/23/2016  . Double vision    history, no current problem  . Dysrhythmia    irregular occassional, no current problem  . Frequent UTI   . HNP (herniated nucleus pulposus), cervical 01/22/2011   C6-C7  Has seen dr Vertell Limber for this and Dr Tonita Cong  Positional numbness in hands no  weakness    . Hypertension   . Hypothyroidism   . Infertility, female   . 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 Obstructive sleep apnea 07/27/2014   NPSG 07/2014:  AHI 85/hr, optimal cpap 10cm.  Download 08/2014:  Good compliance, breakthru apnea on 10cm.  Changed to auto 5-12cm >> good control of AHI on f/u download ONO on CPAP:      . Osteoarthritis   . PCOS (polycystic ovarian syndrome)   . Sleep apnea    wears cpap   . SUI (stress urinary incontinence, female)    h/o   Past Surgical History:  Procedure Laterality Date  . ANTERIOR CERVICAL DECOMP/DISCECTOMY FUSION  12/24/2017   Procedure: Cervical five-six Cervical six-seven Anterior cervical decompression/discectomy/fusion;  Surgeon: Erline Levine, MD;  Location: [facility];  Service: Neurosurgery;;  . Wilmon Pali RELEASE Left 12/24/2017   Procedure: LEFT CARPAL TUNNEL RELEASE;  Surgeon: Erline Levine, MD;  Location: [facility];  Service: Neurosurgery;  Laterality: Left;  . CESAREAN SECTION     x 1 - twins  . COLONOSCOPY    . DILATATION & CURRETTAGE/HYSTEROSCOPY WITH RESECTOCOPE N/A 07/18/2013   Procedure: DILATATION & CURETTAGE/HYSTEROSCOPY WITH RESECTOCOPE;  Surgeon: Lyman Speller, MD;  Location: [facility];  Service: Gynecology;  Laterality: N/A;  mass resection  . Lung tumor removed     Rt lung carcinoid  .  MOUTH SURGERY     pre-cancerous ulcer removed  . POLYPECTOMY    . SHOULDER OPEN ROTATOR CUFF REPAIR  07/20/2010   Right  . WISDOM TOOTH EXTRACTION     Social History   Socioeconomic History  . Marital status: Married    Spouse name: [name]  . Number of children: 2  . Years of education: Not on file  . Highest education level: Not on file  Occupational History  . 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 Smoking status: Never Smoker  . Smokeless tobacco: Never Used  Vaping Use  . Vaping Use: Never used  Substance and Sexual Activity  . Alcohol use: Not Currently  . Drug  use: No  . Sexual activity: Not Currently    Partners: Male    Birth control/protection: Post-menopausal  Other Topics Concern  . Not on file  Social History Narrative   hhof 2    2 Children at college and  beyond      Nichole Smith    Married    Retired age 74 2015   Recently moved taking care of grandchildren during the week.   Social Determinants of Health   Financial Resource Strain: Low Risk   . Difficulty of Paying Living Expenses: Not hard at all  Food Insecurity: No Food Insecurity  . Worried About Charity fundraiser in the Last Year: Never true  . Ran Out of Food in the Last Year: Never true  Transportation Needs: No Transportation Needs  . Lack of Transportation (Medical): No  . Lack of Transportation (Non-Medical): No  Physical Activity: Sufficiently Active  . Days of Exercise per Week: 6 days  . Minutes of Exercise per Session: 30 min  Stress: No Stress Concern Present  . Feeling of Stress : Not at all  Social Connections: Socially Integrated  . Frequency of Communication with Friends and Family: More than three times a week  . Frequency of Social Gatherings with Friends and Family: More than three times a week  . Attends Religious Services: More than 4 times per year  . Active Member of Clubs or Organizations: Yes  . Attends Archivist Meetings: More than 4 times per year  . Marital Status: Married   Allergies  Allergen Reactions  . Neomycin Other (See Comments)    Inflammation--eye drop   . Ace Inhibitors Cough   Family History  Problem Relation Age of Onset  . Stroke Mother        died age 47  . Hypertension Mother   . Sudden death Mother   . Alcoholism Mother   . Coronary artery disease Father        died age 20  . Hypertension Father   . Liver disease Father   . Alcohol abuse Father   . Obesity Father   . Parkinson's disease Brother   . Diabetes Brother   . Hypertension Brother   . Hypertension Maternal Uncle   . Colon cancer  Neg Hx   . Rectal cancer Neg Hx   . Stomach cancer Neg Hx   . Colon polyps Neg Hx   . Esophageal cancer Neg Hx     Current Outpatient Medications (Endocrine & Metabolic):  .  SYNTHROID 137 MCG tablet, TAKE 1 TABLET DAILY .  TRULICITY 3 JM/4.2AS SOPN, INJECT 3 MG ONCE A WEEK AS DIRECTED  Current Outpatient Medications (Cardiovascular):  .  atorvastatin (LIPITOR) 40 MG tablet, Take 1  tablet (40 mg total) by mouth daily. .  chlorthalidone (HYGROTON) 25 MG tablet, Take 1 tablet (25 mg total) by mouth daily. [name]  diltiazem (CARDIZEM CD) 180 MG 24 hr capsule, Take 1 capsule (180 mg total) by mouth daily. Please keep upcoming appt for future refills. .  metoprolol succinate (TOPROL-XL) 50 MG 24 hr tablet, Take 1 tablet (50 mg total) by mouth daily.   Current Outpatient Medications (Analgesics):  .  aspirin EC 81 MG tablet, Take 81 mg by mouth at bedtime. .  naproxen sodium (ALEVE) 220 MG tablet, Take 220 mg by mouth 2 (two) times daily as needed (for pain.).   Current Outpatient Medications (Other):  .  augmented betamethasone dipropionate (DIPROLENE-AF) 0.05 % ointment, Apply topically. [name]  CALCIUM PO, Take 1 tablet by mouth at bedtime.  .  Cholecalciferol (VITAMIN D3) 50 MCG (2000 UT) TABS, Take by mouth. .  estradiol (ESTRACE VAGINAL) 0.1 MG/GM vaginal cream, INSERT 1 GM VAGINALLY TWICE WEEKLY .  gabapentin (NEURONTIN) 100 MG capsule, Take 2 capsules (200 mg total) by mouth at bedtime. .  Multiple Vitamins-Minerals (PRESERVISION AREDS 2 PO), Take 1 tablet by mouth 2 (two) times daily. .  Vibegron (GEMTESA) 75 MG TABS, Take by mouth.   Reviewed prior external information including notes and imaging from  primary care provider and from specialist. Including previous surgery and medication changes  As well as notes that were available from care everywhere and other healthcare systems.  Past medical history, social, surgical and family history all reviewed in electronic medical record.  No  pertanent information unless stated regarding to the chief complaint.   Review of Systems:  No headache, visual changes, nausea, vomiting, diarrhea, constipation, dizziness, abdominal pain, skin rash, fevers, chills, night sweats, weight loss, swollen lymph nodes, body aches, joint swelling, chest pain, shortness of breath, mood changes. POSITIVE muscle aches  Objective  Blood pressure 126/86, pulse 76, height 4\' 11"  (1.499 m), weight 210 lb (95.3 kg), last menstrual period 02/12/2011, SpO2 95 %.   General: No apparent distress alert and oriented x3 mood and affect normal, dressed appropriately.  HEENT: Pupils equal, extraocular movements intact  Respiratory: Patient's speak in full sentences and does not appear short of breath  Cardiovascular: No lower extremity edema, non tender, no erythema  Gait normal with good balance and coordination.  MSK: Low back exam shows the patient does have tightness noted in the paraspinal musculature right greater than left.  Tenderness to palpation of the right paraspinal musculature at L4-L5 as well as the sacroiliac joint.  Mild tightness with Corky Sox on the right greater than left.  Mild pain over the greater trochanteric area on the right side.  Neurovascularly intact distally but patient does have some mild worsening pain with straight leg test.  Patient does have tightness noted with flexion but no significant pain with extension.  Neurovascularly intact distally with 5 out of 5 strength of the lower extremities.  [id number]; 15 additional minutes spent for Therapeutic exercises as stated in above notes.  This included exercises focusing on stretching, strengthening, with significant focus on eccentric aspects.   Long term goals include an improvement in range of motion, strength, endurance as well as avoiding reinjury. Patient's frequency would include in 1-2 times a day, 3-5 times a week for a duration of 6-12 weeks. Low back exercises that included:  Pelvic  tilt/bracing instruction to focus on control of the pelvic girdle and lower abdominal muscles  Glute strengthening exercises, focusing on proper firing  of the glutes without engaging the low back muscles Proper stretching techniques for maximum relief for the hamstrings, hip flexors, low back and some rotation where tolerated   Proper technique shown and discussed handout in great detail with ATC.  All questions were discussed and answered.      Impression and Recommendations:    The above documentation has been reviewed and is accurate and complete Lyndal Pulley, DO

## 2021-04-03 NOTE — Progress Notes (Signed)
Chief Complaint:   OBESITY Nichole Smith is here to discuss her progress with her obesity treatment plan along with follow-up of her obesity related diagnoses. Nichole Smith is on keeping a food journal and adhering to recommended goals of 1400-1500 calories and 95 g protein and states she is following her eating plan approximately 50% of the time. Nichole Smith states she is swimming, weight training, and cardio 15-20 minutes 6 times per week.  Today's visit was #: 72 Starting weight: 238 lbs Starting date: 01/12/2019 Today's weight: 211 lbs Today's date: 04/02/2021 Total lbs lost to date: 27 Total lbs lost since last in-office visit: 0  Interim History: The last 2 weeks Nichole Smith tried to meal plan/prep and relaxed tracking, which was not beneficial. She learned that consistent tracking of intake with [employer]itness[employer] is the most successful way for her to stay on plan.  She is on Trulicity 3 mg once a week. She denies mass in neck, dysphagia, dyspepsia, and persistent hoarseness.  Subjective:   1. Pre-diabetes Nichole Smith's highest A1c is 5.9, per Epic review. Her last A1c on 06/27/2020 was 5.4. She recently increased Trulicity to 3 mg once a week- denies mass in neck, dysphagia, dyspepsia, and persistent hoarseness.  HR 73 today.  2. Essential hypertension BP/HR are at goal at OV. Nichole Smith is on Hygro[place] 25 mg QD, diltiazem 180 mg QD, and metoprolol succinate 50 mg QD.   3. Paroxysmal atrial fibrillation ([place]) Per cards-03/25/21- "Has had short episodes of palpitations for years Caught by Chad in February of 2022,  afib with RVR, 1 hour duration Continue diltaizem and metoprolol at current doses  Has not noted any episodes since February We discussed implanting a LINQ and pt is in favor to understand true burden of afib She would like to go on Celebrex for her arthritis and understands that  would place her at increased risk for bleeding if on anticoagulation If linq shows a greater burden for afib than the pt  appreciates, then possibly could be a Watchman candidate, if NSAIDS still a concern"  Assessment/Plan:   1. Pre-diabetes Nichole Smith will continue to work on weight loss, exercise, and decreasing simple carbohydrates to help decrease the risk of diabetes. Continue journaling and GLP-1 therapy.  2. Essential hypertension Nichole Smith is working on healthy weight loss and exercise to improve blood pressure control. We will watch for signs of hypotension as she continues her lifestyle modifications. Continue current anti-hypertensive regimen.  3. Paroxysmal atrial fibrillation ([place]) Follow up with recent referral to A-fib clinic. Follow up with cardiology as directed.  4. Class 3 severe obesity with serious comorbidity and body mass index (BMI) of 40.0 to 44.9 in adult, unspecified obesity type Nichole Smith)  Nichole Smith. As such, her goal is to continue with weight loss efforts. She has agreed to keeping a food journal and adhering to recommended goals of 1400-1500 calories and 95 grams protein.   Exercise goals: As is  Behavioral modification strategies: increasing lean protein intake, no skipping meals, meal planning and cooking strategies, travel eating strategies, planning for success and keeping a strict food journal.  Nichole Smith has agreed to follow-up with our clinic in 4 weeks. She was informed of the importance of frequent follow-up visits to maximize her success with intensive lifestyle modifications for her multiple health conditions.   Objective:   Blood pressure 110/71, pulse 73, temperature 98.7 F (37.1 C), height 4\' 11"  (1.499 m), weight 211 lb (95.7 kg), last menstrual period 02/12/2011,  SpO2 95 %. Body mass index is 42.62 kg/m.  General: Cooperative, alert, well developed, in no acute distress. HEENT: Conjunctivae and lids unremarkable. Cardiovascular: Regular rhythm.  Lungs: Normal work of breathing. Neurologic: No focal deficits.   Lab Results   Component Value Date   CREATININE 0.68 01/22/2021   BUN 24 01/22/2021   NA 140 01/22/2021   K 3.9 01/22/2021   CL 98 01/22/2021   CO2 27 01/22/2021   Lab Results  Component Value Date   ALT 26 03/04/2021   AST 27 06/27/2020   ALKPHOS 74 06/27/2020   BILITOT 0.4 06/27/2020   Lab Results  Component Value Date   HGBA1C 5.4 06/27/2020   HGBA1C 5.5 01/02/2020   HGBA1C 5.6 08/22/2019   HGBA1C 5.9 (H) 01/12/2019   HGBA1C 5.9 06/07/2018   Lab Results  Component Value Date   INSULIN 13.7 06/27/2020   INSULIN 13.5 01/02/2020   INSULIN 21.5 08/22/2019   INSULIN 20.4 01/12/2019   Lab Results  Component Value Date   TSH 0.98 07/31/2020   Lab Results  Component Value Date   CHOL 117 01/02/2020   HDL 53 01/02/2020   LDLCALC 51 01/02/2020   TRIG 57 01/02/2020   CHOLHDL 2 06/07/2018   Lab Results  Component Value Date   WBC 8.1 06/27/2020   HGB 15.9 06/27/2020   HCT 47.9 (H) 06/27/2020   MCV 94 06/27/2020   PLT 243 06/27/2020    Attestation Statements:   Reviewed by clinician on day of visit: allergies, medications, problem list, medical history, surgical history, family history, social history, and previous encounter notes.  Time spent on visit including pre-visit chart review and post-visit care and charting was 31 minutes.   Coral Ceo, am acting as Location manager for Mina Marble, NP.  I have reviewed the above documentation for accuracy and completeness, and I agree with the above. -  Edla Para d. Claborn Janusz, NP-C

## 2021-04-04 ENCOUNTER — Encounter: Payer: Self-pay | Admitting: Family Medicine

## 2021-04-04 ENCOUNTER — Ambulatory Visit (INDEPENDENT_AMBULATORY_CARE_PROVIDER_SITE_OTHER): Payer: Medicare Other | Admitting: Family Medicine

## 2021-04-04 ENCOUNTER — Ambulatory Visit (INDEPENDENT_AMBULATORY_CARE_PROVIDER_SITE_OTHER): Payer: Medicare Other

## 2021-04-04 ENCOUNTER — Other Ambulatory Visit: Payer: Self-pay

## 2021-04-04 VITALS — BP 126/86 | HR 76 | Ht 59.0 in | Wt 210.0 lb

## 2021-04-04 DIAGNOSIS — M5416 Radiculopathy, lumbar region: Secondary | ICD-10-CM

## 2021-04-04 DIAGNOSIS — I2584 Coronary atherosclerosis due to calcified coronary lesion: Secondary | ICD-10-CM

## 2021-04-04 DIAGNOSIS — M545 Low back pain, unspecified: Secondary | ICD-10-CM

## 2021-04-04 DIAGNOSIS — I2510 Atherosclerotic heart disease of native coronary artery without angina pectoris: Secondary | ICD-10-CM | POA: Diagnosis not present

## 2021-04-04 MED ORDER — METHYLPREDNISOLONE ACETATE 40 MG/ML IJ SUSP
40.0000 mg | Freq: Once | INTRAMUSCULAR | Status: AC
  Administered 2021-04-04: 40 mg via INTRAMUSCULAR

## 2021-04-04 MED ORDER — GABAPENTIN 100 MG PO CAPS: 200.0000 mg | ORAL_CAPSULE | Freq: Every day | ORAL | 0 refills | Status: DC

## 2021-04-04 MED ORDER — KETOROLAC TROMETHAMINE 30 MG/ML IJ SOLN
30.0000 mg | Freq: Once | INTRAMUSCULAR | Status: AC
  Administered 2021-04-04: 30 mg via INTRAMUSCULAR

## 2021-04-04 NOTE — Assessment & Plan Note (Signed)
Patient does have what appears to be some mild lumbar radiculopathy.  Start patient on a very low dose of gabapentin.  Warned of potential side effects but I believe patient will tolerate it well.  In addition to this patient given a injection of Toradol and Depo-Medrol.  We discussed that this is somewhat like a herniated disc that patient has had in her neck previously just not as severe.  Patient will work on core strengthening.  Return to me in 4 to 6 weeks.  At that time discussed the possibility of physical therapy versus the possibility of manipulation.  Patient is in agreement with the plan

## 2021-04-04 NOTE — Patient Instructions (Signed)
Xray today Exercises Gabapentin 200mg  at night Injections today in backside Enjoy your ice and trip See me in 4-6 weeks, discuss need for PT or OMT

## 2021-04-04 NOTE — Addendum Note (Signed)
Addended by: Judy Pimple R on: 04/04/2021 10:52 AM   Modules accepted: Orders

## 2021-04-15 ENCOUNTER — Institutional Professional Consult (permissible substitution): Payer: Medicare Other | Admitting: Internal Medicine

## 2021-04-15 ENCOUNTER — Other Ambulatory Visit: Payer: Self-pay | Admitting: Cardiology

## 2021-04-15 DIAGNOSIS — I480 Paroxysmal atrial fibrillation: Secondary | ICD-10-CM

## 2021-04-29 ENCOUNTER — Other Ambulatory Visit: Payer: Self-pay | Admitting: Obstetrics & Gynecology

## 2021-04-30 ENCOUNTER — Other Ambulatory Visit: Payer: Self-pay | Admitting: Obstetrics & Gynecology

## 2021-04-30 ENCOUNTER — Other Ambulatory Visit: Payer: Self-pay

## 2021-04-30 ENCOUNTER — Ambulatory Visit (INDEPENDENT_AMBULATORY_CARE_PROVIDER_SITE_OTHER): Payer: Medicare Other | Admitting: Adult Health

## 2021-04-30 VITALS — BP 115/76 | HR 74 | Temp 98.3°F | Ht 59.0 in | Wt 207.0 lb

## 2021-04-30 DIAGNOSIS — Z6841 Body Mass Index (BMI) 40.0 and over, adult: Secondary | ICD-10-CM

## 2021-04-30 DIAGNOSIS — I2510 Atherosclerotic heart disease of native coronary artery without angina pectoris: Secondary | ICD-10-CM

## 2021-04-30 DIAGNOSIS — R7303 Prediabetes: Secondary | ICD-10-CM

## 2021-04-30 DIAGNOSIS — E66813 Obesity, class 3: Secondary | ICD-10-CM

## 2021-04-30 DIAGNOSIS — K5904 Chronic idiopathic constipation: Secondary | ICD-10-CM

## 2021-04-30 DIAGNOSIS — I2584 Coronary atherosclerosis due to calcified coronary lesion: Secondary | ICD-10-CM

## 2021-04-30 MED ORDER — LINACLOTIDE 72 MCG PO CAPS: 72.0000 ug | ORAL_CAPSULE | Freq: Every day | ORAL | 0 refills | Status: DC

## 2021-04-30 NOTE — Telephone Encounter (Signed)
Apt 07/2021 at [facility] with Dr Keturah Barre CMA

## 2021-05-01 DIAGNOSIS — K5904 Chronic idiopathic constipation: Secondary | ICD-10-CM | POA: Insufficient documentation

## 2021-05-01 NOTE — Progress Notes (Addendum)
Chief Complaint:   OBESITY Nichole Smith is here to discuss her progress with her obesity treatment plan along with follow-up of her obesity related diagnoses. Nichole Smith is on keeping a food journal and adhering to recommended goals of 1500 calories and 95 grams protein and states she is following her eating plan approximately 75% of the time. Nichole Smith states she is walking, weight training, stretching, and water aerobics 40 minutes 7 times per week.  Today's visit was #: 88 Starting weight: 238 lbs Starting date: 01/12/2019 Today's weight: 207 lbs Today's date: 04/30/2021 Total lbs lost to date: 31 Total lbs lost since last in-office visit: 4  Interim History: Nichole Smith was able to stay on track while traveling by supplementing with protein shakes and bars.  She also tracked intake everyday- excellent. Nichole Smith continues to experience pronounced constipation, despite stool softeners, Miralax, and glycerin suppositories.  Subjective:   1. Pre-diabetes Nichole Smith is on Trulicity 3 mg once weekly. Pt denies mass in neck, dysphagia, dyspepsia, or persistent hoarseness. Constipation is worsening.   Lab Results  Component Value Date   HGBA1C 5.4 06/27/2020   Lab Results  Component Value Date   INSULIN 13.7 06/27/2020   INSULIN 13.5 01/02/2020   INSULIN 21.5 08/22/2019   INSULIN 20.4 01/12/2019    2. Chronic idiopathic constipation Nichole Smith reports constipation worsening over the last 12 months. She denies hematochezia. She denies abdominal pain.  Pt denies family history of colon cancer. She will experience BM once every 2-3 every days after stool softener, Miralax, and glycerin suppositories.   Last colonoscopy 09/25/2020- polpys removed- repeat in 3 years  Assessment/Plan:   1. Pre-diabetes Nichole Smith will continue to work on weight loss, exercise, and decreasing simple carbohydrates to help decrease the risk of diabetes.  -Check fasting labs at next OV.  2. Chronic idiopathic constipation Nichole Smith was  informed that a decrease in bowel movement frequency is normal while losing weight, but stools should not be hard or painful. Orders and follow up as documented in patient record.  -Start Linzess 72 mcg, as prescribed below.  Counseling Getting to Good Bowel Health: Your goal is to have one soft bowel movement each day. Drink at least 8 glasses of water each day. Eat plenty of fiber (goal is over 25 grams each day). It is best to get most of your fiber from dietary sources which includes leafy green vegetables, fresh fruit, and whole grains. You may need to add fiber with the help of OTC fiber supplements. These include Metamucil, Citrucel, and Flaxseed. If you are still having trouble, try adding Miralax or Magnesium Citrate. If all of these changes do not work, Cabin crew.  - linaclotide (LINZESS) 72 MCG capsule; Take 1 capsule (72 mcg total) by mouth daily before breakfast.  Dispense: 90 capsule; Refill: 0  3. Class 3 severe obesity with serious comorbidity and body mass index (BMI) of 40.0 to 44.9 in adult, unspecified obesity type Nichole Smith) Nichole Smith is currently in the action stage of change. As such, her goal is to continue with weight loss efforts. She has agreed to keeping a food journal and adhering to recommended goals of 1500 calories and 95 grams protein.   Check fasting labs at next OV.  Exercise goals:  As is  Behavioral modification strategies: increasing lean protein intake, decreasing simple carbohydrates, meal planning and cooking strategies, keeping healthy foods in the home, planning for success and keeping a strict food journal.  Nichole Smith has agreed to follow-up with our clinic in  4 weeks- fasting. She was informed of the importance of frequent follow-up visits to maximize her success with intensive lifestyle modifications for her multiple health conditions.   Objective:   Blood pressure 115/76, pulse 74, temperature 98.3 F (36.8 C), height 4\' 11"  (1.499 m), weight 207  lb (93.9 kg), last menstrual period 02/12/2011, SpO2 94 %. Body mass index is 41.81 kg/m.  General: Cooperative, alert, well developed, in no acute distress. HEENT: Conjunctivae and lids unremarkable. Cardiovascular: Regular rhythm.  Lungs: Normal work of breathing. Neurologic: No focal deficits.   Lab Results  Component Value Date   CREATININE 0.68 01/22/2021   BUN 24 01/22/2021   NA 140 01/22/2021   K 3.9 01/22/2021   CL 98 01/22/2021   CO2 27 01/22/2021   Lab Results  Component Value Date   ALT 26 03/04/2021   AST 27 06/27/2020   ALKPHOS 74 06/27/2020   BILITOT 0.4 06/27/2020   Lab Results  Component Value Date   HGBA1C 5.4 06/27/2020   HGBA1C 5.5 01/02/2020   HGBA1C 5.6 08/22/2019   HGBA1C 5.9 (H) 01/12/2019   HGBA1C 5.9 06/07/2018   Lab Results  Component Value Date   INSULIN 13.7 06/27/2020   INSULIN 13.5 01/02/2020   INSULIN 21.5 08/22/2019   INSULIN 20.4 01/12/2019   Lab Results  Component Value Date   TSH 0.98 07/31/2020   Lab Results  Component Value Date   CHOL 117 01/02/2020   HDL 53 01/02/2020   LDLCALC 51 01/02/2020   TRIG 57 01/02/2020   CHOLHDL 2 06/07/2018   Lab Results  Component Value Date   WBC 8.1 06/27/2020   HGB 15.9 06/27/2020   HCT 47.9 (H) 06/27/2020   MCV 94 06/27/2020   PLT 243 06/27/2020   No results found for: IRON, TIBC, FERRITIN  Obesity Behavioral Intervention:   Approximately 15 minutes were spent on the discussion below.  ASK: We discussed the diagnosis of obesity with Nichole Smith today and Nichole Smith agreed to give [name] permission to discuss obesity behavioral modification therapy today.  ASSESS: Nichole Smith has the diagnosis of obesity and her BMI today is 42.0. Nichole Smith is in the action stage of change.   ADVISE: Nichole Smith was educated on the multiple health risks of obesity as well as the benefit of weight loss to improve her health. She was advised of the need for long term treatment and the importance of lifestyle modifications  to improve her current health and to decrease her risk of future health problems.  AGREE: Multiple dietary modification options and treatment options were discussed and Neida agreed to follow the recommendations documented in the above note.  ARRANGE: Sorcha was educated on the importance of frequent visits to treat obesity as outlined per CMS and USPSTF guidelines and agreed to schedule her next follow up appointment today.  Attestation Statements:   Reviewed by clinician on day of visit: allergies, medications, problem list, medical history, surgical history, family history, social history, and previous encounter notes.  Coral Ceo, CMA, am acting as transcriptionist for Mina Marble, NP.  I have reviewed the above documentation for accuracy and completeness, and I agree with the above. -  Abdulraheem Pineo d. Simcha Farrington, NP-C

## 2021-05-04 ENCOUNTER — Other Ambulatory Visit: Payer: Self-pay | Admitting: Internal Medicine

## 2021-05-06 ENCOUNTER — Other Ambulatory Visit: Payer: Self-pay | Admitting: Cardiology

## 2021-05-07 NOTE — Progress Notes (Signed)
[place] Phone: [phone] Subjective:   [name], am serving as a scribe for Dr. Hulan Saas. This visit occurred during the SARS-CoV-2 public health emergency.  Safety protocols were in place, including screening questions prior to the visit, additional usage of staff PPE, and extensive cleaning of exam room while observing appropriate contact time as indicated for disinfecting solutions.   I'm seeing this patient by the request  of:  Panosh, Standley Brooking, MD  CC: Low back pain  GNO:[id number]   04/04/2021 Patient does have what appears to be some mild lumbar radiculopathy.  Start patient on a very low dose of gabapentin.  Warned of potential side effects but I believe patient will tolerate it well.  In addition to this patient given a injection of Toradol and Depo-Medrol.  We discussed that this is somewhat like a herniated disc that patient has had in her neck previously just not as severe.  Patient will work on core strengthening.  Return to me in 4 to 6 weeks.  At that time discussed the possibility of physical therapy versus the possibility of manipulation.  Patient is in agreement with the plan   Update 05/08/2021 ALAYNE ESTRELLA is a 65 y.o. female coming in with complaint of lumbar spine pain. Patient states that injections last visit which did help her to get through traveling. Pain has been increasing in lower back and more so over R greater trochanter. Notes tenderness on lateral hips with sidelying exercises. Has aching pain in R hamstring.      Past Medical History:  Diagnosis Date  . Arthritis    knees, back  . Bursitis    hips - almost gone per pt   . Cancer ([place])   . Carcinoid tumor of lung 2012   right found incidentally on chest xray eval for r/o vasxculitis   . Carpal tunnel syndrome, bilateral    bilat but surgery to correct   . Coronary artery calcification 07/23/2016  . Double vision     history, no current problem  . Dysrhythmia    irregular occassional, no current problem  . Frequent UTI   . HNP (herniated nucleus pulposus), cervical 01/22/2011   C6-C7  Has seen dr Vertell Limber for this and Dr Tonita Cong  Positional numbness in hands no weakness    . Hypertension   . Hypothyroidism   . Infertility, female   . 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 Obstructive sleep apnea 07/27/2014   NPSG 07/2014:  AHI 85/hr, optimal cpap 10cm.  Download 08/2014:  Good compliance, breakthru apnea on 10cm.  Changed to auto 5-12cm >> good control of AHI on f/u download ONO on CPAP:      . Osteoarthritis   . PCOS (polycystic ovarian syndrome)   . Sleep apnea    wears cpap   . SUI (stress urinary incontinence, female)    h/o   Past Surgical History:  Procedure Laterality Date  . ANTERIOR CERVICAL DECOMP/DISCECTOMY FUSION  12/24/2017   Procedure: Cervical five-six Cervical six-seven Anterior cervical decompression/discectomy/fusion;  Surgeon: Erline Levine, MD;  Location: [place];  Service: Neurosurgery;;  . Wilmon Pali RELEASE Left 12/24/2017   Procedure: LEFT CARPAL TUNNEL RELEASE;  Surgeon: Erline Levine, MD;  Location: [facility] [facility];  Service: Neurosurgery;  Laterality: Left;  . CESAREAN SECTION     x 1 - twins  . COLONOSCOPY    . DILATATION & CURRETTAGE/HYSTEROSCOPY WITH RESECTOCOPE N/A 07/18/2013  Procedure: [place];  Surgeon: Lyman Speller, MD;  Location: [facility];  Service: Gynecology;  Laterality: N/A;  mass resection  . Lung tumor removed     Rt lung carcinoid  . MOUTH SURGERY     pre-cancerous ulcer removed  . POLYPECTOMY    . SHOULDER OPEN ROTATOR CUFF REPAIR  07/20/2010   Right  . WISDOM TOOTH EXTRACTION     Social History   Socioeconomic History  . Marital status: Married    Spouse name: [name]  . Number of children: 2  . Years of education: Not on file  . Highest education level: Not on file  Occupational History  .  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 Smoking status: Never Smoker  . Smokeless tobacco: Never Used  Vaping Use  . Vaping Use: Never used  Substance and Sexual Activity  . Alcohol use: Not Currently  . Drug use: No  . Sexual activity: Not Currently    Partners: Male    Birth control/protection: Post-menopausal  Other Topics Concern  . Not on file  Social History Narrative   hhof 2    2 Children at college and  beyond      [name]    Married    Retired age 89 2015   Recently moved taking care of grandchildren during the week.   Social Determinants of Health   Financial Resource Strain: Low Risk   . Difficulty of Paying Living Expenses: Not hard at all  Food Insecurity: No Food Insecurity  . Worried About Charity fundraiser in the Last Year: Never true  . Ran Out of Food in the Last Year: Never true  Transportation Needs: No Transportation Needs  . Lack of Transportation (Medical): No  . Lack of Transportation (Non-Medical): No  Physical Activity: Sufficiently Active  . Days of Exercise per Week: 6 days  . Minutes of Exercise per Session: 30 min  Stress: No Stress Concern Present  . Feeling of Stress : Not at all  Social Connections: Socially Integrated  . Frequency of Communication with Friends and Family: More than three times a week  . Frequency of Social Gatherings with Friends and Family: More than three times a week  . Attends Religious Services: More than 4 times per year  . Active Member of Clubs or Organizations: Yes  . Attends Archivist Meetings: More than 4 times per year  . Marital Status: Married   Allergies  Allergen Reactions  . Neomycin Other (See Comments)    Inflammation--eye drop   . Ace Inhibitors Cough   Family History  Problem Relation Age of Onset  . Stroke Mother        died age 72  . Hypertension Mother   . Sudden death Mother   . Alcoholism Mother   . Coronary artery  disease Father        died age 101  . Hypertension Father   . Liver disease Father   . Alcohol abuse Father   . Obesity Father   . Parkinson's disease Brother   . Diabetes Brother   . Hypertension Brother   . Hypertension Maternal Uncle   . Colon cancer Neg Hx   . Rectal cancer Neg Hx   . Stomach cancer Neg Hx   . Colon polyps Neg Hx   . Esophageal cancer Neg Hx     Current Outpatient Medications (Endocrine & Metabolic):  .  SYNTHROID 137 MCG tablet, TAKE 1 TABLET DAILY (NEED TO SCHEDULE AN APPOINTMENT FOR MORE REFILLS) .  TRULICITY 3 HM/0.9OB SOPN, INJECT 3 MG ONCE A WEEK AS DIRECTED  Current Outpatient Medications (Cardiovascular):  .  atorvastatin (LIPITOR) 40 MG tablet, TAKE 1 TABLET DAILY .  chlorthalidone (HYGROTON) 25 MG tablet, Take 1 tablet (25 mg total) by mouth daily. [name]  diltiazem (CARDIZEM CD) 180 MG 24 hr capsule, Take 1 capsule (180 mg total) by mouth daily. .  metoprolol succinate (TOPROL-XL) 50 MG 24 hr tablet, Take 1 tablet (50 mg total) by mouth daily.   Current Outpatient Medications (Analgesics):  .  aspirin EC 81 MG tablet, Take 81 mg by mouth at bedtime. .  naproxen sodium (ALEVE) 220 MG tablet, Take 220 mg by mouth 2 (two) times daily as needed (for pain.).   Current Outpatient Medications (Other):  .  augmented betamethasone dipropionate (DIPROLENE-AF) 0.05 % ointment, USE AS DIRECTED. DO NOT USE MORE THAN 5 DAYS IN A ROW OR MORE THAN 7 DAYS EACH MONTH. [name]  CALCIUM PO, Take 1 tablet by mouth at bedtime.  .  Cholecalciferol (VITAMIN D3) 50 MCG (2000 UT) TABS, Take by mouth. .  estradiol (ESTRACE VAGINAL) 0.1 MG/GM vaginal cream, INSERT 1 GRAM VAGINALLY TWICE WEEKLY .  gabapentin (NEURONTIN) 100 MG capsule, Take 2 capsules (200 mg total) by mouth at bedtime. .  gabapentin (NEURONTIN) 100 MG capsule, Take 2 capsules (200 mg total) by mouth at bedtime. [name]  linaclotide (LINZESS) 72 MCG capsule, Take 1 capsule (72 mcg total) by mouth daily before breakfast. .   Multiple Vitamins-Minerals (PRESERVISION AREDS 2 PO), Take 1 tablet by mouth 2 (two) times daily. .  Vibegron (GEMTESA) 75 MG TABS, Take by mouth.   Reviewed prior external information including notes and imaging from  primary care provider As well as notes that were available from care everywhere and other healthcare systems.  Past medical history, social, surgical and family history all reviewed in electronic medical record.  No pertanent information unless stated regarding to the chief complaint.   Review of Systems:  No headache, visual changes, nausea, vomiting, diarrhea, constipation, dizziness, abdominal pain, skin rash, fevers, chills, night sweats, weight loss, swollen lymph nodes, body aches, joint swelling, chest pain, shortness of breath, mood changes. POSITIVE muscle aches  Objective  Blood pressure 122/86, pulse 65, height 4\' 11"  (1.499 m), weight 215 lb (97.5 kg), last menstrual period 02/12/2011, SpO2 98 %.   General: No apparent distress alert and oriented x3 mood and affect normal, dressed appropriately.  Morbidly obese HEENT: Pupils equal, extraocular movements intact  Respiratory: Patient's speak in full sentences and does not appear short of breath  Cardiovascular: Trace lower extremity edema, non tender, no erythema  Gait antalgic favoring the right leg MSK: Significant tightness noted of the greater trochanteric area and severe tenderness on the lateral aspect of the right hip.  Tightness with FABER test.  Patient does have tightness with straight leg test with mild radicular symptoms it appears but no significant weakness noted.  After verbal consent patient was prepped with alcohol swab and with a 21-gauge 2 inch needle injected into the right greater trochanteric area with a total of 2 cc of 0.5% Marcaine and 1 cc of Kenalog 40 mg/mL.  No blood loss.  Postinjection instructions given after Band-Aid placed.   Impression and Recommendations:     The above  documentation has been reviewed and is accurate and complete Lyndal Pulley, DO

## 2021-05-08 ENCOUNTER — Ambulatory Visit (INDEPENDENT_AMBULATORY_CARE_PROVIDER_SITE_OTHER): Payer: Medicare Other | Admitting: Family Medicine

## 2021-05-08 ENCOUNTER — Encounter: Payer: Self-pay | Admitting: Family Medicine

## 2021-05-08 ENCOUNTER — Ambulatory Visit (INDEPENDENT_AMBULATORY_CARE_PROVIDER_SITE_OTHER): Payer: Medicare Other

## 2021-05-08 ENCOUNTER — Other Ambulatory Visit: Payer: Self-pay

## 2021-05-08 VITALS — BP 122/86 | HR 65 | Ht 59.0 in | Wt 215.0 lb

## 2021-05-08 DIAGNOSIS — I2584 Coronary atherosclerosis due to calcified coronary lesion: Secondary | ICD-10-CM

## 2021-05-08 DIAGNOSIS — M5416 Radiculopathy, lumbar region: Secondary | ICD-10-CM | POA: Diagnosis not present

## 2021-05-08 DIAGNOSIS — M25551 Pain in right hip: Secondary | ICD-10-CM | POA: Diagnosis not present

## 2021-05-08 DIAGNOSIS — M7061 Trochanteric bursitis, right hip: Secondary | ICD-10-CM | POA: Insufficient documentation

## 2021-05-08 DIAGNOSIS — I2510 Atherosclerotic heart disease of native coronary artery without angina pectoris: Secondary | ICD-10-CM

## 2021-05-08 MED ORDER — GABAPENTIN 100 MG PO CAPS: 200.0000 mg | ORAL_CAPSULE | Freq: Every day | ORAL | 0 refills | Status: DC

## 2021-05-08 NOTE — Assessment & Plan Note (Signed)
Patient given injection today and tolerated the procedure well.  Given some other exercises.  Still concerned that this is more of a lumbar radiculopathy with the amount of arthritic changes noted on the patient's x-rays.  Patient signs and symptoms also is somewhat consistent with the possibility of spinal stenosis.  Discussed with patient if no significant benefit with this I would consider the possibility of an MRI of the lumbar spine and the pelvis area.  Patient also has a history of multiple other medical problems.  I believe that certain things such as an epidural could be beneficial for this individual if needed.  Follow-up with me again in 6 weeks otherwise.

## 2021-05-08 NOTE — Patient Instructions (Addendum)
Xray today Write me in 1 week, if not better will get MRI Injected R GT today See me again in 6-8 weeks

## 2021-05-08 NOTE — Assessment & Plan Note (Signed)
Still concern for potential lumbar radiculopathy.  Discussed with patient medicine regimen and home exercise, which activities to do which wants to avoid.  If this injection does not help would like MRI of the lumbar spine as well as pelvis to further evaluate.  Patient is in agreement with the plan and will call in 1 to 2 weeks if no significant improvement.  Otherwise we will continue with the conservative therapy and see me again in 6 weeks

## 2021-05-20 ENCOUNTER — Encounter: Payer: Self-pay | Admitting: Internal Medicine

## 2021-05-20 ENCOUNTER — Ambulatory Visit (INDEPENDENT_AMBULATORY_CARE_PROVIDER_SITE_OTHER): Payer: Medicare Other | Admitting: Internal Medicine

## 2021-05-20 ENCOUNTER — Other Ambulatory Visit: Payer: Self-pay

## 2021-05-20 VITALS — BP 142/86 | HR 63 | Ht 59.0 in | Wt 216.0 lb

## 2021-05-20 DIAGNOSIS — I480 Paroxysmal atrial fibrillation: Secondary | ICD-10-CM | POA: Diagnosis not present

## 2021-05-20 DIAGNOSIS — R002 Palpitations: Secondary | ICD-10-CM | POA: Diagnosis not present

## 2021-05-20 DIAGNOSIS — I10 Essential (primary) hypertension: Secondary | ICD-10-CM | POA: Diagnosis not present

## 2021-05-20 HISTORY — PX: OTHER SURGICAL HISTORY: SHX169

## 2021-05-20 NOTE — Patient Instructions (Addendum)
Medication Instructions:  Your physician recommends that you continue on your current medications as directed. Please refer to the Current Medication list given to you today.  Labwork: None ordered.  Testing/Procedures: None ordered.  Follow-Up:  Your physician wants you to follow-up in: 07/22/21 at 2 pm with Dr. Rayann Heman.    Implantable Loop Recorder Placement, Care After This sheet gives you information about how to care for yourself after your procedure. Your health care provider may also give you more specific instructions. If you have problems or questions, contact your health care provider. What can I expect after the procedure? After the procedure, it is common to have: Soreness or discomfort near the incision. Some swelling or bruising near the incision.  Follow these instructions at home: Incision care   Leave your outer dressing on for 24 hours.  After 24 hours you can remove your outer dressing and shower. Leave adhesive strips in place. These skin closures may need to stay in place for 1-2 weeks. If adhesive strip edges start to loosen and curl up, you may trim the loose edges.  You may remove the strips if they have not fallen off after 2 weeks. Check your incision area every day for signs of infection. Check for: Redness, swelling, or pain. Fluid or blood. Warmth. Pus or a bad smell. Do not take baths, swim, or use a hot tub until your incision is completely healed. If your wound site starts to bleed apply pressure.      If you have any questions/concerns please call the device clinic at [phone].  Activity  Return to your normal activities.  General instructions Follow instructions from your health care provider about how to manage your implantable loop recorder and transmit the information. Learn how to activate a recording if this is necessary for your type of device. Do not go through a metal detection gate, and do not let someone hold a metal detector  over your chest. Show your ID card. Do not have an MRI unless you check with your health care provider first. Take over-the-counter and prescription medicines only as told by your health care provider. Keep all follow-up visits as told by your health care provider. This is important. Contact a health care provider if: You have redness, swelling, or pain around your incision. You have a fever. You have pain that is not relieved by your pain medicine. You have triggered your device because of fainting (syncope) or because of a heartbeat that feels like it is racing, slow, fluttering, or skipping (palpitations). Get help right away if you have: Chest pain. Difficulty breathing. Summary After the procedure, it is common to have soreness or discomfort near the incision. Change your dressing as told by your health care provider. Follow instructions from your health care provider about how to manage your implantable loop recorder and transmit the information. Keep all follow-up visits as told by your health care provider. This is important. This information is not intended to replace advice given to you by your health care provider. Make sure you discuss any questions you have with your health care provider. Document Released: 10/29/2015 Document Revised: 01/02/2018 Document Reviewed: 01/02/2018 Elsevier Patient Education  2020 [employer].

## 2021-05-20 NOTE — Progress Notes (Signed)
Electrophysiology Office Note   Date:  05/20/2021   ID:  Oriel, Ojo [birth year], MRN [id number]  PCP:  Burnis Medin, MD  Cardiologist:  Dr Radford Pax Primary Electrophysiologist: Thompson Grayer, MD    CC: palpitations   History of Present Illness: Nichole Smith is a 65 y.o. female who presents today for electrophysiology evaluation.   She is referred by Dr Radford Pax and Roderic Palau for EP consultation. She has had longstanding palpitations of unclear etiology.  She has previously been told that she had anxiety as the cause.  She did have a single episode of AF detected in February.  She is reluctant to start [facility] given need for NSAIDs for DJD.  she takes ASA without difficulty.  She is concerned about her palpitations.  She is also concerned about [place] if she has low AF burden.   Today, she denies symptoms of palpitations, chest pain, shortness of breath, orthopnea, PND, lower extremity edema, claudication, dizziness, presyncope, syncope, bleeding, or neurologic sequela. The patient is tolerating medications without difficulties and is otherwise without complaint today.    Past Medical History:  Diagnosis Date   Arthritis    knees, back   Bursitis    hips - almost gone per pt    Cancer ([place])    Carcinoid tumor of lung 2012   right found incidentally on chest xray eval for r/o vasxculitis    Carpal tunnel syndrome, bilateral    bilat but surgery to correct    Coronary artery calcification 07/23/2016   Double vision    history, no current problem   Dysrhythmia    irregular occassional, no current problem   Frequent UTI    HNP (herniated nucleus pulposus), cervical 01/22/2011   C6-C7  Has seen dr Vertell Limber for this and Dr Tonita Cong  Positional numbness in hands no weakness     Hypertension    Hypothyroidism    Infertility, female    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  Download 08/2014:   Good compliance, breakthru apnea on 10cm.  Changed to auto 5-12cm >> good control of AHI on f/u download ONO on CPAP:       Osteoarthritis    PCOS (polycystic ovarian syndrome)    Sleep apnea    wears cpap    SUI (stress urinary incontinence, female)    h/o   Past Surgical History:  Procedure Laterality Date   ANTERIOR CERVICAL DECOMP/DISCECTOMY FUSION  12/24/2017   Procedure: Cervical five-six Cervical six-seven Anterior cervical decompression/discectomy/fusion;  Surgeon: Erline Levine, MD;  Location: [place];  Service: Neurosurgery;;   CARPAL TUNNEL RELEASE Left 12/24/2017   Procedure: LEFT CARPAL TUNNEL RELEASE;  Surgeon: Erline Levine, MD;  Location: [place];  Service: Neurosurgery;  Laterality: Left;   CESAREAN SECTION     x 1 - twins   COLONOSCOPY     DILATATION & CURRETTAGE/HYSTEROSCOPY WITH RESECTOCOPE N/A 07/18/2013   Procedure: [place];  Surgeon: Lyman Speller, MD;  Location: [facility];  Service: Gynecology;  Laterality: N/A;  mass resection   Lung tumor removed     Rt lung carcinoid   MOUTH SURGERY     pre-cancerous ulcer removed   POLYPECTOMY     SHOULDER OPEN ROTATOR CUFF REPAIR  07/20/2010   Right   WISDOM TOOTH EXTRACTION       Current Outpatient Medications  Medication Sig Dispense Refill  aspirin EC 81 MG tablet Take 81 mg by mouth at bedtime.     atorvastatin (LIPITOR) 40 MG tablet TAKE 1 TABLET DAILY 90 tablet 3   augmented betamethasone dipropionate (DIPROLENE-AF) 0.05 % ointment USE AS DIRECTED. DO NOT USE MORE THAN 5 DAYS IN A ROW OR MORE THAN 7 DAYS EACH MONTH. 45 g 0   CALCIUM PO Take 1 tablet by mouth at bedtime.      chlorthalidone (HYGROTON) 25 MG tablet Take 1 tablet (25 mg total) by mouth daily. 90 tablet 3   Cholecalciferol (VITAMIN D3) 50 MCG (2000 UT) TABS Take by mouth.     diltiazem (CARDIZEM CD) 180 MG 24 hr capsule Take 1 capsule (180 mg total) by mouth daily. 90 capsule 3   estradiol (ESTRACE  VAGINAL) 0.1 MG/GM vaginal cream INSERT 1 GRAM VAGINALLY TWICE WEEKLY 42.5 g 2   gabapentin (NEURONTIN) 100 MG capsule Take 2 capsules (200 mg total) by mouth at bedtime. 180 capsule 0   metoprolol succinate (TOPROL-XL) 50 MG 24 hr tablet Take 1 tablet (50 mg total) by mouth daily. 90 tablet 3   Multiple Vitamins-Minerals (PRESERVISION AREDS 2 PO) Take 1 tablet by mouth 2 (two) times daily.     naproxen sodium (ALEVE) 220 MG tablet Take 220 mg by mouth 2 (two) times daily as needed (for pain.).     SYNTHROID 137 MCG tablet TAKE 1 TABLET DAILY (NEED TO SCHEDULE AN APPOINTMENT FOR MORE REFILLS) 90 tablet 0   TRULICITY 3 WH/6.7RF SOPN INJECT 3 MG ONCE A WEEK AS DIRECTED 6 mL 3   No current facility-administered medications for this visit.    Allergies:   Neomycin and Ace inhibitors   Social History:  The patient  reports that she has never smoked. She has never used smokeless tobacco. She reports previous alcohol use. She reports that she does not use drugs.   Family History:  The patient's  family history includes Alcohol abuse in her father; Alcoholism in her mother; Coronary artery disease in her father; Diabetes in her brother; Hypertension in her brother, father, maternal uncle, and mother; Liver disease in her father; Obesity in her father; Parkinson's disease in her brother; Stroke in her mother; Sudden death in her mother.    ROS:  Please see the history of present illness.   All other systems are personally reviewed and negative.    PHYSICAL EXAM: VS:  BP (!) 142/86   Pulse 63   Ht 4\' 11"  (1.499 m)   Wt 216 lb (98 kg)   LMP 02/12/2011   SpO2 98%   BMI 43.63 kg/m  , BMI Body mass index is 43.63 kg/m. GEN: Well nourished, well developed, in no acute distress HEENT: normal Neck: no JVD, carotid bruits, or masses Cardiac: RRR; no murmurs, rubs, or gallops,no edema  Respiratory:  clear to auscultation bilaterally, normal work of breathing GI: soft, nontender, nondistended, +  BS MS: no deformity or atrophy Skin: warm and dry  Neuro:  Strength and sensation are intact Psych: euthymic mood, full affect  EKG:  EKG is ordered today. The ekg ordered today is personally reviewed and shows sinus rhythm   Recent Labs: 06/27/2020: Hemoglobin 15.9; Platelets 243 07/31/2020: TSH 0.98 01/22/2021: BUN 24; Creatinine, Ser 0.68; Potassium 3.9; Sodium 140 03/04/2021: ALT 26  personally reviewed   Lipid Panel     Component Value Date/Time   CHOL 117 01/02/2020 1155   CHOL 110 11/03/2016 0000   TRIG 57 01/02/2020 1155  TRIG 54 11/03/2016 0000   HDL 53 01/02/2020 1155   HDL 48 (L) 11/03/2016 0000   CHOLHDL 2 06/07/2018 0739   VLDL 10.8 06/07/2018 0739   LDLCALC 51 01/02/2020 1155   LDLCALC 49 11/03/2016 0000   personally reviewed   Wt Readings from Last 3 Encounters:  05/20/21 216 lb (98 kg)  05/08/21 215 lb (97.5 kg)  04/30/21 207 lb (93.9 kg)      Other studies personally reviewed: Additional studies/ records that were reviewed today include: AF clinic notes,  Dr Landis Gandy notes  Review of the above records today demonstrates: as above   ASSESSMENT AND PLAN:  1.  Palpitations Unclear etiology Have previously been attributed to anxiety She worries about these palpitations and would like to have them better characterized.  She has worn event monitors previously and has also tried Chad without success.  I would therefore recommend ILR for long term monitoring to better characterize her palpitations.  2. Paroxysmal atrial fibrillation She has a low AF burden Chad2vasc score is 3.  She takes ASA.  She would like to better characterize her AF burden before considering [place]. We discussed AVERROES trial together today which would support [place].  I have also discussed REACT AF potential study as well as AHA guidelines and [place] data. Using a shared decision making process, we have decided together to proceed with ILR implantation to further characterize her  AF. She will return in 2 months for further discussion.  3. HTN Continue current doses of toprol and chlorthalidone for medical management of her HTN  4. Obesity Body mass index is 43.63 kg/m. We discussed at length today She is working diligently on this and also working with the wellness/ weight loss clinic but without her desired results.  Risks, benefits and potential toxicities for medications prescribed and/or refilled reviewed with patient today.    Follow-up:  2 months     Signed, Thompson Grayer, MD  05/20/2021 9:27 AM     [facility] [address] [place] [address] [phone] (office) [phone] (fax)    PROCEDURES:   1. Implantable loop recorder implantation     DESCRIPTION OF PROCEDURE:  Informed written consent was obtained.  The patient required no sedation for the procedure today.  The patients left chest was prepped and draped. Mapping over the patient's chest was performed to identify the appropriate ILR site.  This area was found to be the left parasternal region over the 3rd-4th intercostal space.  The skin overlying this region was infiltrated with lidocaine for local analgesia.  A 0.5-cm incision was made at the implant site.  A subcutaneous ILR pocket was fashioned using a combination of sharp and blunt dissection.  A Medtronic Reveal Linq model [id number] implantable loop recorder (SN [id number]) was then placed into the pocket R waves were very prominent and measured > 0.2 mV. EBL<1 ml.  Steri- Strips and a sterile dressing were then applied.  There were no early apparent complications.     CONCLUSIONS:   1. Successful implantation of a Medtronic Reveal LINQ implantable loop recorder for cryptogenic stroke  2. No early apparent complications.   Thompson Grayer MD, [facility] 05/20/2021 10:18 AM

## 2021-05-23 ENCOUNTER — Other Ambulatory Visit: Payer: Self-pay

## 2021-05-23 ENCOUNTER — Encounter: Payer: Self-pay | Admitting: Family Medicine

## 2021-05-23 DIAGNOSIS — M545 Low back pain, unspecified: Secondary | ICD-10-CM

## 2021-05-23 DIAGNOSIS — M25551 Pain in right hip: Secondary | ICD-10-CM

## 2021-05-23 NOTE — Progress Notes (Signed)
Ordered MRIs. Patient notified.

## 2021-05-29 ENCOUNTER — Ambulatory Visit (INDEPENDENT_AMBULATORY_CARE_PROVIDER_SITE_OTHER): Payer: Medicare Other | Admitting: Adult Health

## 2021-05-31 ENCOUNTER — Telehealth: Payer: Self-pay

## 2021-05-31 NOTE — Telephone Encounter (Signed)
Spoke with pt in regards to recent [employer].  Advised patient that her ILR is monitoring her 24 hours a day, data is being transmitted nightly via the Carelink app as long as she keeps the app running in the background on her phone.    I have spoken with [employer] rep regarding the functionality of her Carelink app and device.  She will have ability to mark symptoms within the carelink app, it takes 1-3 weeks after implant for this to be available.    Discussed 05/28/21 AF episode with patient.  She reports the symptoms she had were- fast racing heartbeat, followed by fatigue the rest of the day.  She was concerned with the length of the episode as typically her AF only lasts an hour, this episode was more than 3 hours.    Advised pt I would forward to Dr. Rayann Heman due to her concerns about length of episode and not being able to document symptom.  Anticipate continued monitoring.  Pt expressed gratitude for call and explanation.

## 2021-06-07 ENCOUNTER — Ambulatory Visit
Admission: RE | Admit: 2021-06-07 | Discharge: 2021-06-07 | Disposition: A | Discharge status: Home / Self Care | Payer: Medicare Other | Source: Ambulatory Visit | Attending: Family Medicine | Admitting: Family Medicine

## 2021-06-07 DIAGNOSIS — M545 Low back pain, unspecified: Secondary | ICD-10-CM

## 2021-06-07 DIAGNOSIS — M25552 Pain in left hip: Secondary | ICD-10-CM

## 2021-06-07 DIAGNOSIS — M25551 Pain in right hip: Secondary | ICD-10-CM

## 2021-06-11 ENCOUNTER — Encounter: Payer: Self-pay | Admitting: Family Medicine

## 2021-06-15 NOTE — Telephone Encounter (Signed)
Continue to monitor

## 2021-06-18 NOTE — Progress Notes (Signed)
[facility] [place] Phone: [phone] Subjective:   [name], am serving as a scribe for Dr. Hulan Saas. This visit occurred during the SARS-CoV-2 public health emergency.  Safety protocols were in place, including screening questions prior to the visit, additional usage of staff PPE, and extensive cleaning of exam room while observing appropriate contact time as indicated for disinfecting solutions.    I'm seeing this patient by the request  of:  Panosh, Standley Brooking, MD  CC: Low back and hip pain follow-up  WEX:[id number]  05/08/2021 Patient given injection today and tolerated the procedure well.  Given some other exercises.  Still concerned that this is more of a lumbar radiculopathy with the amount of arthritic changes noted on the patient's x-rays.  Patient signs and symptoms also is somewhat consistent with the possibility of spinal stenosis.  Discussed with patient if no significant benefit with this I would consider the possibility of an MRI of the lumbar spine and the pelvis area.  Patient also has a history of multiple other medical problems.  I believe that certain things such as an epidural could be beneficial for this individual if needed.  Follow-up with me again in 6 weeks otherwise.  Still concern for potential lumbar radiculopathy.  Discussed with patient medicine regimen and home exercise, which activities to do which wants to avoid.  If this injection does not help would like MRI of the lumbar spine as well as pelvis to further evaluate.  Patient is in agreement with the plan and will call in 1 to 2 weeks if no significant improvement.  Otherwise we will continue with the conservative therapy and see me again in 6 weeks  Update 06/19/2021 DASHAE WILCHER is a 65 y.o. female coming in with complaint of R hip and LBP. Patient states that her back pain continues intermittently but that her R hip pain is more  bothersome.   06/07/2021 Lumbar MRI  IMPRESSION: 1.   Patient does have severe spinal stenosis at L3-L4. 2. No acute osseous injury of the lumbar spine.  MRI Pelvis 06/07/2021 IMPRESSION: 1. No acute osseous injury of the pelvis. 2. Mild tendinosis of the left gluteus minimus tendon insertion. 3. Moderate tendinosis of the left gluteus medius tendon insertion. 4. Severe tendinosis of the right gluteus medius tendon insertion with a partial thickness tear. 5. Moderate tendinosis of the right gluteus minimus tendon insertion. 6. Partial-thickness tear of the hamstring origins bilaterally.     Past Medical History:  Diagnosis Date   Arthritis    knees, back   Bursitis    hips - almost gone per pt    Cancer ([place])    Carcinoid tumor of lung 2012   right found incidentally on chest xray eval for r/o vasxculitis    Carpal tunnel syndrome, bilateral    bilat but surgery to correct    Coronary artery calcification 07/23/2016   Double vision    history, no current problem   Dysrhythmia    irregular occassional, no current problem   Frequent UTI    HNP (herniated nucleus pulposus), cervical 01/22/2011   C6-C7  Has seen dr Vertell Limber for this and Dr Tonita Cong  Positional numbness in hands no weakness     Hypertension    Hypothyroidism    Infertility, female    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  Download  08/2014:  Good compliance, breakthru apnea on 10cm.  Changed to auto 5-12cm >> good control of AHI on f/u download ONO on CPAP:       Osteoarthritis    PCOS (polycystic ovarian syndrome)    Sleep apnea    wears cpap    SUI (stress urinary incontinence, female)    h/o   Past Surgical History:  Procedure Laterality Date   ANTERIOR CERVICAL DECOMP/DISCECTOMY FUSION  12/24/2017   Procedure: Cervical five-six Cervical six-seven Anterior cervical decompression/discectomy/fusion;  Surgeon: Erline Levine,  MD;  Location: [facility];  Service: Neurosurgery;;   CARPAL TUNNEL RELEASE Left 12/24/2017   Procedure: LEFT CARPAL TUNNEL RELEASE;  Surgeon: Erline Levine, MD;  Location: [facility];  Service: Neurosurgery;  Laterality: Left;   CESAREAN SECTION     x 1 - twins   COLONOSCOPY     DILATATION & CURRETTAGE/HYSTEROSCOPY WITH RESECTOCOPE N/A 07/18/2013   Procedure: [place];  Surgeon: Lyman Speller, MD;  Location: [facility];  Service: Gynecology;  Laterality: N/A;  mass resection   implantable loop recorder placement  05/20/2021   Medtronic Reveal Linq model LNQ22 implantable loop recorder (SN [id number])   Lung tumor removed     Rt lung carcinoid   MOUTH SURGERY     pre-cancerous ulcer removed   POLYPECTOMY     SHOULDER OPEN ROTATOR CUFF REPAIR  07/20/2010   Right   WISDOM TOOTH EXTRACTION     Social History   Socioeconomic History   Marital status: Married    Spouse name: [name]   Number of children: 2   Years of education: Not on file   Highest education level: Not on file  Occupational History   Occupation: retired    Fish farm manager: Charity fundraiser  Tobacco Use   Smoking status: Never   Smokeless tobacco: Never  Vaping Use   Vaping Use: Never used  Substance and Sexual Activity   Alcohol use: Not Currently   Drug use: No   Sexual activity: Not Currently    Partners: Male    Birth control/protection: Post-menopausal  Other Topics Concern   Not on file  Social History Narrative   hhof 2    2 Children at college and  beyond      [name]    Married    Retired age 50 2015   Recently moved taking care of grandchildren during the week.   Social Determinants of Health   Financial Resource Strain: Low Risk    Difficulty of Paying Living Expenses: Not hard at all  Food Insecurity: No Food Insecurity   Worried About Charity fundraiser in the Last Year: Never true   [place] in the Last Year: Never true   Transportation Needs: No Transportation Needs   Lack of Transportation (Medical): No   Lack of Transportation (Non-Medical): No  Physical Activity: Sufficiently Active   Days of Exercise per Week: 6 days   Minutes of Exercise per Session: 30 min  Stress: No Stress Concern Present   Feeling of Stress : Not at all  Social Connections: Socially Integrated   Frequency of Communication with Friends and Family: More than three times a week   Frequency of Social Gatherings with Friends and Family: More than three times a week   Attends Religious Services: More than 4 times per year   Active Member of [employer] or Organizations: Yes   Attends Archivist Meetings: More than 4 times per  year   Marital Status: Married   Allergies  Allergen Reactions   Neomycin Other (See Comments)    Inflammation--eye drop    Ace Inhibitors Cough   Family History  Problem Relation Age of Onset   Stroke Mother        died age 22   Hypertension Mother    Sudden death Mother    Alcoholism Mother    Coronary artery disease Father        died age 8   Hypertension Father    Liver disease Father    Alcohol abuse Father    Obesity Father    Parkinson's disease Brother    Diabetes Brother    Hypertension Brother    Hypertension Maternal Uncle    Colon cancer Neg Hx    Rectal cancer Neg Hx    Stomach cancer Neg Hx    Colon polyps Neg Hx    Esophageal cancer Neg Hx     Current Outpatient Medications (Endocrine & Metabolic):    SYNTHROID 662 MCG tablet, TAKE 1 TABLET DAILY (NEED TO SCHEDULE AN APPOINTMENT FOR MORE REFILLS)   TRULICITY 3 HU/7.6LY SOPN, INJECT 3 MG ONCE A WEEK AS DIRECTED  Current Outpatient Medications (Cardiovascular):    atorvastatin (LIPITOR) 40 MG tablet, TAKE 1 TABLET DAILY   chlorthalidone (HYGROTON) 25 MG tablet, Take 1 tablet (25 mg total) by mouth daily.   diltiazem (CARDIZEM CD) 180 MG 24 hr capsule, Take 1 capsule (180 mg total) by mouth daily.   metoprolol  succinate (TOPROL-XL) 50 MG 24 hr tablet, Take 1 tablet (50 mg total) by mouth daily.   Current Outpatient Medications (Analgesics):    aspirin EC 81 MG tablet, Take 81 mg by mouth at bedtime.   naproxen sodium (ALEVE) 220 MG tablet, Take 220 mg by mouth 2 (two) times daily as needed (for pain.).   Current Outpatient Medications (Other):    augmented betamethasone dipropionate (DIPROLENE-AF) 0.05 % ointment, USE AS DIRECTED. DO NOT USE MORE THAN 5 DAYS IN A ROW OR MORE THAN 7 DAYS EACH MONTH.   CALCIUM PO, Take 1 tablet by mouth at bedtime.    Cholecalciferol (VITAMIN D3) 50 MCG (2000 UT) TABS, Take by mouth.   estradiol (ESTRACE VAGINAL) 0.1 MG/GM vaginal cream, INSERT 1 GRAM VAGINALLY TWICE WEEKLY   gabapentin (NEURONTIN) 100 MG capsule, Take 2 capsules (200 mg total) by mouth at bedtime.   Multiple Vitamins-Minerals (PRESERVISION AREDS 2 PO), Take 1 tablet by mouth 2 (two) times daily.   Reviewed prior external information including notes and imaging from  primary care provider As well as notes that were available from care everywhere and other healthcare systems.  Past medical history, social, surgical and family history all reviewed in electronic medical record.  No pertanent information unless stated regarding to the chief complaint.   Review of Systems:  No headache, visual changes, nausea, vomiting, diarrhea, constipation, dizziness, abdominal pain, skin rash, fevers, chills, night sweats, weight loss, swollen lymph nodes,  joint swelling, chest pain, shortness of breath, mood changes. POSITIVE muscle aches, body aches  Objective  Blood pressure 132/84, pulse 76, height 4\' 11"  (1.499 m), weight 221 lb (100.2 kg), last menstrual period 02/12/2011, SpO2 97 %.   General: No apparent distress alert and oriented x3 mood and affect normal, dressed appropriately.  HEENT: Pupils equal, extraocular movements intact  Respiratory: Patient's speak in full sentences and does not appear  short of breath  Cardiovascular: No lower extremity edema, non tender, no  erythema  Gait antalgic gait Patient does have severe tenderness still over the greater trochanteric area in the gluteal area on the right side.  Tightness with FABER test.  Negative straight leg test.   Impression and Recommendations:     The above documentation has been reviewed and is accurate and complete Lyndal Pulley, DO

## 2021-06-19 ENCOUNTER — Ambulatory Visit (INDEPENDENT_AMBULATORY_CARE_PROVIDER_SITE_OTHER): Payer: Medicare Other | Admitting: Family Medicine

## 2021-06-19 ENCOUNTER — Encounter: Payer: Self-pay | Admitting: Family Medicine

## 2021-06-19 ENCOUNTER — Other Ambulatory Visit: Payer: Self-pay

## 2021-06-19 VITALS — BP 132/84 | HR 76 | Ht 59.0 in | Wt 221.0 lb

## 2021-06-19 DIAGNOSIS — I2584 Coronary atherosclerosis due to calcified coronary lesion: Secondary | ICD-10-CM | POA: Diagnosis not present

## 2021-06-19 DIAGNOSIS — I2510 Atherosclerotic heart disease of native coronary artery without angina pectoris: Secondary | ICD-10-CM

## 2021-06-19 DIAGNOSIS — M7601 Gluteal tendinitis, right hip: Secondary | ICD-10-CM | POA: Diagnosis not present

## 2021-06-19 DIAGNOSIS — M5416 Radiculopathy, lumbar region: Secondary | ICD-10-CM

## 2021-06-19 NOTE — Assessment & Plan Note (Signed)
Noted on hip MRI.  No worsening pain with no improvement with a spinal epidural would consider the possibility of injections.  Patient is in agreement with the plan.  Follow-up again after epidural of the cervical spine for the spinal stenosis for further evaluation and treatment.

## 2021-06-19 NOTE — Assessment & Plan Note (Signed)
MRI does show that patient does have spinal stenosis noted.  I believe that we should try epidural for diagnostic as well as therapeutic purposes.  Discussed icing regimen and home exercises.  Increase activity slowly.  Follow-up again in 6 to 8 weeks.

## 2021-06-19 NOTE — Patient Instructions (Signed)
Epidural L3/L4 [id number] PRP read about it See me 4 weeks after injection

## 2021-06-24 ENCOUNTER — Ambulatory Visit (INDEPENDENT_AMBULATORY_CARE_PROVIDER_SITE_OTHER): Payer: Medicare Other

## 2021-06-24 DIAGNOSIS — I480 Paroxysmal atrial fibrillation: Secondary | ICD-10-CM | POA: Diagnosis not present

## 2021-06-25 ENCOUNTER — Other Ambulatory Visit: Payer: Self-pay

## 2021-06-25 ENCOUNTER — Encounter (INDEPENDENT_AMBULATORY_CARE_PROVIDER_SITE_OTHER): Payer: Self-pay | Admitting: Adult Health

## 2021-06-25 ENCOUNTER — Ambulatory Visit (INDEPENDENT_AMBULATORY_CARE_PROVIDER_SITE_OTHER): Payer: Medicare Other | Admitting: Adult Health

## 2021-06-25 VITALS — BP 120/77 | HR 66 | Temp 97.8°F | Ht 59.0 in | Wt 211.0 lb

## 2021-06-25 DIAGNOSIS — I2584 Coronary atherosclerosis due to calcified coronary lesion: Secondary | ICD-10-CM

## 2021-06-25 DIAGNOSIS — Z6841 Body Mass Index (BMI) 40.0 and over, adult: Secondary | ICD-10-CM

## 2021-06-25 DIAGNOSIS — R7303 Prediabetes: Secondary | ICD-10-CM

## 2021-06-25 DIAGNOSIS — I2510 Atherosclerotic heart disease of native coronary artery without angina pectoris: Secondary | ICD-10-CM

## 2021-06-25 DIAGNOSIS — I10 Essential (primary) hypertension: Secondary | ICD-10-CM

## 2021-06-25 DIAGNOSIS — E559 Vitamin D deficiency, unspecified: Secondary | ICD-10-CM

## 2021-06-25 LAB — CUP PACEART REMOTE DEVICE CHECK
Date Time Interrogation Session: 20220723135534
Implantable Pulse Generator Implant Date: 20220620

## 2021-06-26 ENCOUNTER — Ambulatory Visit
Admission: RE | Admit: 2021-06-26 | Discharge: 2021-06-26 | Disposition: A | Discharge status: Home / Self Care | Payer: Medicare Other | Source: Ambulatory Visit | Attending: Family Medicine | Admitting: Family Medicine

## 2021-06-26 DIAGNOSIS — M5416 Radiculopathy, lumbar region: Secondary | ICD-10-CM

## 2021-06-26 LAB — COMPREHENSIVE METABOLIC PANEL
ALT: 27 IU/L (ref 0–32)
AST: 24 IU/L (ref 0–40)
Albumin/Globulin Ratio: 1.4 (ref 1.2–2.2)
Albumin: 4 g/dL (ref 3.8–4.8)
Alkaline Phosphatase: 70 IU/L (ref 44–121)
BUN/Creatinine Ratio: 41 — ABNORMAL HIGH (ref 12–28)
BUN: 23 mg/dL (ref 8–27)
Bilirubin Total: 0.4 mg/dL (ref 0.0–1.2)
CO2: 28 mmol/L (ref 20–29)
Calcium: 9.7 mg/dL (ref 8.7–10.3)
Chloride: 99 mmol/L (ref 96–106)
Creatinine, Ser: 0.56 mg/dL — ABNORMAL LOW (ref 0.57–1.00)
Globulin, Total: 2.9 g/dL (ref 1.5–4.5)
Glucose: 83 mg/dL (ref 65–99)
Potassium: 3.9 mmol/L (ref 3.5–5.2)
Sodium: 141 mmol/L (ref 134–144)
Total Protein: 6.9 g/dL (ref 6.0–8.5)
eGFR: 101 mL/min/{1.73_m2} (ref >59)

## 2021-06-26 LAB — HEMOGLOBIN A1C
Est. average glucose Bld gHb Est-mCnc: 105 mg/dL
Hgb A1c MFr Bld: 5.3 % (ref 4.8–5.6)

## 2021-06-26 LAB — INSULIN, RANDOM: INSULIN: 12.2 u[IU]/mL (ref 2.6–24.9)

## 2021-06-26 LAB — VITAMIN D 25 HYDROXY (VIT D DEFICIENCY, FRACTURES): Vit D, 25-Hydroxy: 53.1 ng/mL (ref 30.0–100.0)

## 2021-06-26 MED ORDER — METHYLPREDNISOLONE ACETATE 40 MG/ML INJ SUSP (RADIOLOG
80.0000 mg | Freq: Once | INTRAMUSCULAR | Status: AC
  Administered 2021-06-26: 80 mg via EPIDURAL

## 2021-06-26 MED ORDER — IOPAMIDOL (ISOVUE-M 200) INJECTION 41%
1.0000 mL | Freq: Once | INTRAMUSCULAR | Status: AC
  Administered 2021-06-26: 1 mL via EPIDURAL

## 2021-06-26 NOTE — Discharge Instructions (Signed)

## 2021-06-27 NOTE — Progress Notes (Signed)
Chief Complaint:   OBESITY Nichole Smith is here to discuss her progress with her obesity treatment plan along with follow-up of her obesity related diagnoses. Nichole Smith is on keeping a food journal and adhering to recommended goals of 1500 calories and 95 grams protein and states she is following her eating plan approximately 40% of the time. Nichole Smith states she is doing cardio and stretching 30 minutes 7 times per week.  Today's visit was #: 55 Starting weight: 238 lbs Starting date: 01/12/2019 Today's weight: 211 lbs Today's date: 06/25/2021 Total lbs lost to date: 27 Total lbs lost since last in-office visit: 0  Interim History: The following has occurred the last few months: Links device implanted for Afib New CPAP machine- still experiencing daily fatigue.  She is slowly becoming more comfortable with nightly CPAP. Continued lumbar back pain- will undergo epidural injection for sx control.  Subjective:   1. Essential hypertension Nichole Smith's BP/HR are excellent at OV. Pt denies acute cardiac symptoms.  2. Pre-diabetes Her last A1c was at goal at 5.4 on 06/27/2020.  She is on Trulicity 3 mg once a week. She denies mass in neck, dysphagia, dyspepsia, persistent hoarseness, or GI upset.  3. Vitamin D deficiency 06/27/2020 Vit D level was 51.9. She is currently taking OTC vitamin D 2,000 IU each day. She denies nausea, vomiting or muscle weakness.   Assessment/Plan:   1. Essential hypertension Nichole Smith is working on healthy weight loss and exercise to improve blood pressure control. We will watch for signs of hypotension as she continues her lifestyle modifications. Check labs today.  - Comprehensive metabolic panel  2. Pre-diabetes Nichole Smith will continue to work on weight loss, exercise, and decreasing simple carbohydrates to help decrease the risk of diabetes.  Check labs today.  - Hemoglobin A1c - Insulin, random  3. Vitamin D deficiency Low Vitamin D level contributes to fatigue and  are associated with obesity, breast, and colon cancer. She agrees to continue to take OTC  Vitamin D 2,000 IU QD and will follow-up for routine testing of Vitamin D, at least 2-3 times per year to avoid over-replacement. Check labs today.  - VITAMIN D 25 Hydroxy (Vit-D Deficiency, Fractures)  4. Class 3 severe obesity with serious comorbidity and body mass index (BMI) of 40.0 to 44.9 in adult, unspecified obesity type Nichole Smith)  Nichole Smith is currently in the action stage of change. As such, her goal is to continue with weight loss efforts. She has agreed to keeping a food journal and adhering to recommended goals of 1500 calories and 95 grams protein.   Exercise goals:  As is  Behavioral modification strategies: increasing lean protein intake, decreasing simple carbohydrates, meal planning and cooking strategies, keeping healthy foods in the home, and planning for success.  Nichole Smith has agreed to follow-up with our clinic in 3 weeks. She was informed of the importance of frequent follow-up visits to maximize her success with intensive lifestyle modifications for her multiple health conditions.   Nichole Smith was informed we would discuss her lab results at her next visit unless there is a critical issue that needs to be addressed sooner. Nichole Smith agreed to keep her next visit at the agreed upon time to discuss these results.  Objective:   Blood pressure 120/77, pulse 66, temperature 97.8 F (36.6 C), height 4\' 11"  (1.499 m), weight 211 lb (95.7 kg), last menstrual period 02/12/2011, SpO2 96 %. Body mass index is 42.62 kg/m.  General: Cooperative, alert, well developed, in no acute distress. HEENT:  Conjunctivae and lids unremarkable. Cardiovascular: Regular rhythm.  Lungs: Normal work of breathing. Neurologic: No focal deficits.   Lab Results  Component Value Date   CREATININE 0.56 (L) 06/25/2021   BUN 23 06/25/2021   NA 141 06/25/2021   K 3.9 06/25/2021   CL 99 06/25/2021   CO2 28 06/25/2021   Lab  Results  Component Value Date   ALT 27 06/25/2021   AST 24 06/25/2021   ALKPHOS 70 06/25/2021   BILITOT 0.4 06/25/2021   Lab Results  Component Value Date   HGBA1C 5.3 06/25/2021   HGBA1C 5.4 06/27/2020   HGBA1C 5.5 01/02/2020   HGBA1C 5.6 08/22/2019   HGBA1C 5.9 (H) 01/12/2019   Lab Results  Component Value Date   INSULIN 12.2 06/25/2021   INSULIN 13.7 06/27/2020   INSULIN 13.5 01/02/2020   INSULIN 21.5 08/22/2019   INSULIN 20.4 01/12/2019   Lab Results  Component Value Date   TSH 0.98 07/31/2020   Lab Results  Component Value Date   CHOL 117 01/02/2020   HDL 53 01/02/2020   LDLCALC 51 01/02/2020   TRIG 57 01/02/2020   CHOLHDL 2 06/07/2018   Lab Results  Component Value Date   VD25OH 53.1 06/25/2021   VD25OH 51.9 06/27/2020   VD25OH 63.9 01/02/2020   Lab Results  Component Value Date   WBC 8.1 06/27/2020   HGB 15.9 06/27/2020   HCT 47.9 (H) 06/27/2020   MCV 94 06/27/2020   PLT 243 06/27/2020   No results found for: IRON, TIBC, FERRITIN  Obesity Behavioral Intervention:   Approximately 15 minutes were spent on the discussion below.  ASK: We discussed the diagnosis of obesity with Nichole Smith today and Nichole Smith agreed to give [name] permission to discuss obesity behavioral modification therapy today.  ASSESS: Nichole Smith has the diagnosis of obesity and her BMI today is 42.8. Nichole Smith is in the action stage of change.   ADVISE: Nichole Smith was educated on the multiple health risks of obesity as well as the benefit of weight loss to improve her health. She was advised of the need for long term treatment and the importance of lifestyle modifications to improve her current health and to decrease her risk of future health problems.  AGREE: Multiple dietary modification options and treatment options were discussed and Nichole Smith agreed to follow the recommendations documented in the above note.  ARRANGE: Nichole Smith was educated on the importance of frequent visits to treat obesity as outlined  per CMS and USPSTF guidelines and agreed to schedule her next follow up appointment today.  Attestation Statements:   Reviewed by clinician on day of visit: allergies, medications, problem list, medical history, surgical history, family history, social history, and previous encounter notes.  Coral Ceo, CMA, am acting as transcriptionist for Mina Marble, NP.  I have reviewed the above documentation for accuracy and completeness, and I agree with the above. -  Rick Warnick d. Khadeeja Elden, NP-C

## 2021-07-05 ENCOUNTER — Telehealth: Payer: Self-pay | Admitting: Adult Health

## 2021-07-05 NOTE — Telephone Encounter (Signed)
The patient called back and let me know that she did have a machine and she is going to bring the SD card to the visit. She did not have any concerns.

## 2021-07-05 NOTE — Telephone Encounter (Signed)
I was checking charts and see that the last download was from 04/22 and I wanted to see if the patient was using her machine and if so we would need the updated SD card if available. Waiting on a return call.

## 2021-07-08 ENCOUNTER — Other Ambulatory Visit: Payer: Self-pay

## 2021-07-08 ENCOUNTER — Encounter: Payer: Self-pay | Admitting: Adult Health

## 2021-07-08 ENCOUNTER — Ambulatory Visit (INDEPENDENT_AMBULATORY_CARE_PROVIDER_SITE_OTHER): Payer: Medicare Other | Admitting: Adult Health

## 2021-07-08 DIAGNOSIS — I2510 Atherosclerotic heart disease of native coronary artery without angina pectoris: Secondary | ICD-10-CM | POA: Diagnosis not present

## 2021-07-08 DIAGNOSIS — I2584 Coronary atherosclerosis due to calcified coronary lesion: Secondary | ICD-10-CM

## 2021-07-08 DIAGNOSIS — G4733 Obstructive sleep apnea (adult) (pediatric): Secondary | ICD-10-CM

## 2021-07-08 DIAGNOSIS — Z6841 Body Mass Index (BMI) 40.0 and over, adult: Secondary | ICD-10-CM

## 2021-07-08 NOTE — Assessment & Plan Note (Signed)
Work on healthy weight loss 

## 2021-07-08 NOTE — Progress Notes (Signed)
@Patient  ID: Nichole Smith, female    DOB: 1956/09/21, 65 y.o.   MRN: [id number]  Chief Complaint  Patient presents with   Follow-up    Referring provider: Burnis Medin, MD  HPI: 65 year old female followed for obstructive sleep apnea  Medical history significant for leukocytoclastic vasculitis, hypertension, carcinoid tumor of the lung in 2012  TEST/EVENTS :  PSG 07/10/14 >> AHI 85  ONO with CPAP 06/08/18 >> test time 8 hrs 42 min.  Basal SpO2 93.6%, low SpO2 81%.  Spent 4.3 min with SpO2 < 88%.  07/08/2021 Follow up : OSA  Patient presents for a follow-up visit.  Patient has underlying severe obstructive sleep apnea.  She is on nocturnal CPAP.  She recently got a new CPAP machine.  She says it is working very well.  And she has a new nasal CPAP mask.  She says this is also fitting very well.  She says she is sleeping much better.  Patient has the my air app on her cell phone .  CPAP download shows excellent compliance with daily average usage at 8 hours.  100% compliance.  Patient is on CPAP 16 cm H2O.  AHI 0.5.    Allergies  Allergen Reactions   Neomycin Other (See Comments)    Inflammation--eye drop    Ace Inhibitors Cough    Immunization History  Administered Date(s) Administered   Fluad Quad(high Dose 65+) 09/21/2019, 09/10/2020   Influenza Split 10/17/2011, 08/12/2012, 08/01/2013   Influenza, Quadrivalent, Recombinant, Inj, Pf 08/12/2018   Influenza,inj,Quad PF,6+ Mos 09/04/2014, 08/27/2015, 08/01/2016, 08/10/2017   Influenza-Unspecified 08/30/2018   Moderna Sars-Covid-2 Vaccination 01/27/2020, 09/22/2020, 12/15/2020   Pneumococcal Conjugate-13 10/05/2019   Pneumococcal Polysaccharide-23 09/10/2020   Td 12/01/1997, 10/16/2008   Tdap 01/30/2014   Zoster Recombinat (Shingrix) 01/12/2017, 04/02/2017   Zoster, Live 05/25/2012    Past Medical History:  Diagnosis Date   Arthritis    knees, back   Bursitis    hips - almost gone per pt    Cancer ([place])     Carcinoid tumor of lung 2012   right found incidentally on chest xray eval for r/o vasxculitis    Carpal tunnel syndrome, bilateral    bilat but surgery to correct    Coronary artery calcification 07/23/2016   Double vision    history, no current problem   Dysrhythmia    irregular occassional, no current problem   Frequent UTI    HNP (herniated nucleus pulposus), cervical 01/22/2011   C6-C7  Has seen dr Vertell Limber for this and Dr Tonita Cong  Positional numbness in hands no weakness     Hypertension    Hypothyroidism    Infertility, female    Leukocytoclastic vasculitis ([place]) 05/26/2011   Obstructive sleep apnea 07/27/2014   NPSG 07/2014:  AHI 85/hr, optimal cpap 10cm.  Download 08/2014:  Good compliance, breakthru apnea on 10cm.  Changed to auto 5-12cm >> good control of AHI on f/u download ONO on CPAP:       Osteoarthritis    PCOS (polycystic ovarian syndrome)    Sleep apnea    wears cpap    SUI (stress urinary incontinence, female)    h/o    Tobacco History: Social History   Tobacco Use  Smoking Status Never  Smokeless Tobacco Never   Counseling given: Not Answered   Outpatient Medications Prior to Visit  Medication Sig Dispense Refill   aspirin EC 81 MG tablet Take 81 mg by mouth at bedtime.     atorvastatin (  LIPITOR) 40 MG tablet TAKE 1 TABLET DAILY 90 tablet 3   augmented betamethasone dipropionate (DIPROLENE-AF) 0.05 % ointment USE AS DIRECTED. DO NOT USE MORE THAN 5 DAYS IN A ROW OR MORE THAN 7 DAYS EACH MONTH. 45 g 0   CALCIUM PO Take 1 tablet by mouth at bedtime.      chlorthalidone (HYGROTON) 25 MG tablet Take 1 tablet (25 mg total) by mouth daily. 90 tablet 3   diltiazem (CARDIZEM CD) 180 MG 24 hr capsule Take 1 capsule (180 mg total) by mouth daily. 90 capsule 3   estradiol (ESTRACE VAGINAL) 0.1 MG/GM vaginal cream INSERT 1 GRAM VAGINALLY TWICE WEEKLY 42.5 g 2   metoprolol succinate (TOPROL-XL) 50 MG 24 hr tablet Take 1 tablet (50 mg total) by mouth daily. 90 tablet 3    Multiple Vitamins-Minerals (PRESERVISION AREDS 2 PO) Take 1 tablet by mouth 2 (two) times daily.     naproxen sodium (ALEVE) 220 MG tablet Take 220 mg by mouth 2 (two) times daily as needed (for pain.).     SYNTHROID 137 MCG tablet TAKE 1 TABLET DAILY (NEED TO SCHEDULE AN APPOINTMENT FOR MORE REFILLS) 90 tablet 0   TRULICITY 3 PI/9.5JO SOPN INJECT 3 MG ONCE A WEEK AS DIRECTED 6 mL 3   Cholecalciferol (VITAMIN D3) 50 MCG (2000 UT) TABS Take by mouth.     No facility-administered medications prior to visit.     Review of Systems:   Constitutional:   No  weight loss, night sweats,  Fevers, chills, fatigue, or  lassitude.  HEENT:   No headaches,  Difficulty swallowing,  Tooth/dental problems, or  Sore throat,                No sneezing, itching, ear ache, nasal congestion, post nasal drip,   CV:  No chest pain,  Orthopnea, PND, swelling in lower extremities, anasarca, dizziness, palpitations, syncope.   GI  No heartburn, indigestion, abdominal pain, nausea, vomiting, diarrhea, change in bowel habits, loss of appetite, bloody stools.   Resp: No shortness of breath with exertion or at rest.  No excess mucus, no productive cough,  No non-productive cough,  No coughing up of blood.  No change in color of mucus.  No wheezing.  No chest wall deformity  Skin: no rash or lesions.  GU: no dysuria, change in color of urine, no urgency or frequency.  No flank pain, no hematuria   MS:  No joint pain or swelling.  No decreased range of motion.  No back pain.    Physical Exam  BP 122/72 (BP Location: Left Arm, Patient Position: Sitting, Cuff Size: Large)   Pulse 74   Temp 98 F (36.7 C) (Oral)   Ht 4\' 11"  (1.499 m)   Wt 216 lb 6.4 oz (98.2 kg)   LMP 02/12/2011   SpO2 95%   BMI 43.71 kg/m   GEN: A/Ox3; pleasant , NAD, well nourished    HEENT:  [place]/AT,    NOSE-clear, THROAT-clear, no lesions, no postnasal drip or exudate noted.  Class 2-3 MP airway   NECK:  Supple w/ fair ROM; no JVD;  normal carotid impulses w/o bruits; no thyromegaly or nodules palpated; no lymphadenopathy.    RESP  Clear  P & A; w/o, wheezes/ rales/ or rhonchi. no accessory muscle use, no dullness to percussion  CARD:  RRR, no m/r/g, no peripheral edema, pulses intact, no cyanosis or clubbing.  GI:   Soft & nt; nml bowel sounds; no organomegaly  or masses detected.   Musco: Warm bil, no deformities or joint swelling noted.   Neuro: alert, no focal deficits noted.    Skin: Warm, no lesions or rashes    Lab Results:  CBC   BNP No results found for: BNP  ProBNP No results found for: PROBNP     No flowsheet data found.  No results found for: NITRICOXIDE      Assessment & Plan:   Obstructive sleep apnea Excellent control and compliance on nocturnal CPAP  Plan  Patient Instructions  Continue on CPAP At bedtime   Keep up the good work .  Work on healthy weight .  Do not drive if sleepy .  Follow up in 1 year with Dr. Halford Chessman or Anjalina Bergevin NP and As needed        Class 3 severe obesity with serious comorbidity and body mass index (BMI) of 40.0 to 44.9 in adult [facility]) Work on healthy weight loss     Rexene Edison, NP 07/08/2021

## 2021-07-08 NOTE — Assessment & Plan Note (Signed)
Excellent control and compliance on nocturnal CPAP  Plan  Patient Instructions  Continue on CPAP At bedtime   Keep up the good work .  Work on healthy weight .  Do not drive if sleepy .  Follow up in 1 year with Dr. Halford Chessman or Tyanne Derocher NP and As needed

## 2021-07-08 NOTE — Patient Instructions (Signed)
Continue on CPAP At bedtime   Keep up the good work .  Work on healthy weight .  Do not drive if sleepy .  Follow up in 1 year with Dr. Halford Chessman or Nancie Bocanegra NP and As needed

## 2021-07-11 NOTE — Progress Notes (Signed)
[name] [place] Phone: [phone] Subjective:   Rito Ehrlich, am serving as a scribe for Dr. Hulan Saas.  I'm seeing this patient by the request  of:  Panosh, Standley Brooking, MD  CC: back pain follow up   [id number]  06/19/2021 Noted on hip MRI.  No worsening pain with no improvement with a spinal epidural would consider the possibility of injections.  Patient is in agreement with the plan.  Follow-up again after epidural of the cervical spine for the spinal stenosis for further evaluation and treatment.  MRI does show that patient does have spinal stenosis noted.  I believe that we should try epidural for diagnostic as well as therapeutic purposes.  Discussed icing regimen and home exercises.  Increase activity slowly.  Follow-up again in 6 to 8 weeks.  Update 07/15/2021 VIRGINIE JOSTEN is a 65 y.o. female coming in with complaint of low back and R glute pain. Likely from spinal stenosis found on MRI . Epidural on 06/26/2021. Patient states that she is doing  a lot better, patient still has some pain on the right side hip pain.  Patient states that overall though she is feeling about 80 to 90% better.  Patient is very motivated to lose weight and become more active.     Past Medical History:  Diagnosis Date   Arthritis    knees, back   Bursitis    hips - almost gone per pt    Cancer ([place])    Carcinoid tumor of lung 2012   right found incidentally on chest xray eval for r/o vasxculitis    Carpal tunnel syndrome, bilateral    bilat but surgery to correct    Coronary artery calcification 07/23/2016   Double vision    history, no current problem   Dysrhythmia    irregular occassional, no current problem   Frequent UTI    HNP (herniated nucleus pulposus), cervical 01/22/2011   C6-C7  Has seen dr Vertell Limber for this and Dr Tonita Cong  Positional numbness in hands no weakness     Hypertension    Hypothyroidism     Infertility, female    Leukocytoclastic vasculitis ([place]) 05/26/2011   Obstructive sleep apnea 07/27/2014   NPSG 07/2014:  AHI 85/hr, optimal cpap 10cm.  Download 08/2014:  Good compliance, breakthru apnea on 10cm.  Changed to auto 5-12cm >> good control of AHI on f/u download ONO on CPAP:       Osteoarthritis    PCOS (polycystic ovarian syndrome)    Sleep apnea    wears cpap    SUI (stress urinary incontinence, female)    h/o   Past Surgical History:  Procedure Laterality Date   ANTERIOR CERVICAL DECOMP/DISCECTOMY FUSION  12/24/2017   Procedure: Cervical five-six Cervical six-seven Anterior cervical decompression/discectomy/fusion;  Surgeon: Erline Levine, MD;  Location: [place];  Service: Neurosurgery;;   CARPAL TUNNEL RELEASE Left 12/24/2017   Procedure: LEFT CARPAL TUNNEL RELEASE;  Surgeon: Erline Levine, MD;  Location: [facility];  Service: Neurosurgery;  Laterality: Left;   CESAREAN SECTION     x 1 - twins   COLONOSCOPY     DILATATION & CURRETTAGE/HYSTEROSCOPY WITH RESECTOCOPE N/A 07/18/2013   Procedure: [place];  Surgeon: Lyman Speller, MD;  Location: [facility];  Service: Gynecology;  Laterality: N/A;  mass resection   implantable loop recorder placement  05/20/2021   Medtronic Reveal Linq model [id number] implantable loop recorder (SN  [id number])   Lung tumor removed     Rt lung carcinoid   MOUTH SURGERY     pre-cancerous ulcer removed   POLYPECTOMY     SHOULDER OPEN ROTATOR CUFF REPAIR  07/20/2010   Right   WISDOM TOOTH EXTRACTION     Social History   Socioeconomic History   Marital status: Married    Spouse name: [name]   Number of children: 2   Years of education: Not on file   Highest education level: Not on file  Occupational History   Occupation: retired    Fish farm manager: Charity fundraiser  Tobacco Use   Smoking status: Never   Smokeless tobacco: Never  Vaping Use   Vaping Use: Never used  Substance and Sexual  Activity   Alcohol use: Not Currently   Drug use: No   Sexual activity: Not Currently    Partners: Male    Birth control/protection: Post-menopausal  Other Topics Concern   Not on file  Social History Narrative   hhof 2    2 Children at college and  beyond      [name]    Married    Retired age 33 2015   Recently moved taking care of grandchildren during the week.   Social Determinants of Health   Financial Resource Strain: Low Risk    Difficulty of Paying Living Expenses: Not hard at all  Food Insecurity: No Food Insecurity   Worried About Charity fundraiser in the Last Year: Never true   [place] in the Last Year: Never true  Transportation Needs: No Transportation Needs   Lack of Transportation (Medical): No   Lack of Transportation (Non-Medical): No  Physical Activity: Sufficiently Active   Days of Exercise per Week: 6 days   Minutes of Exercise per Session: 30 min  Stress: No Stress Concern Present   Feeling of Stress : Not at all  Social Connections: Socially Integrated   Frequency of Communication with Friends and Family: More than three times a week   Frequency of Social Gatherings with Friends and Family: More than three times a week   Attends Religious Services: More than 4 times per year   Active Member of [employer] or Organizations: Yes   Attends Music therapist: More than 4 times per year   Marital Status: Married   Allergies  Allergen Reactions   Neomycin Other (See Comments)    Inflammation--eye drop    Ace Inhibitors Cough   Family History  Problem Relation Age of Onset   Stroke Mother        died age 32   Hypertension Mother    Sudden death Mother    Alcoholism Mother    Coronary artery disease Father        died age 8   Hypertension Father    Liver disease Father    Alcohol abuse Father    Obesity Father    Parkinson's disease Brother    Diabetes Brother    Hypertension Brother    Hypertension Maternal Uncle     Colon cancer Neg Hx    Rectal cancer Neg Hx    Stomach cancer Neg Hx    Colon polyps Neg Hx    Esophageal cancer Neg Hx     Current Outpatient Medications (Endocrine & Metabolic):    SYNTHROID 025 MCG tablet, TAKE 1 TABLET DAILY (NEED TO SCHEDULE AN APPOINTMENT FOR MORE REFILLS)   TRULICITY 3 [place]/7.0WC SOPN, INJECT 3  MG ONCE A WEEK AS DIRECTED  Current Outpatient Medications (Cardiovascular):    atorvastatin (LIPITOR) 40 MG tablet, TAKE 1 TABLET DAILY   chlorthalidone (HYGROTON) 25 MG tablet, Take 1 tablet (25 mg total) by mouth daily.   diltiazem (CARDIZEM CD) 180 MG 24 hr capsule, Take 1 capsule (180 mg total) by mouth daily.   metoprolol succinate (TOPROL-XL) 50 MG 24 hr tablet, Take 1 tablet (50 mg total) by mouth daily.   Current Outpatient Medications (Analgesics):    aspirin EC 81 MG tablet, Take 81 mg by mouth at bedtime.   naproxen sodium (ALEVE) 220 MG tablet, Take 220 mg by mouth 2 (two) times daily as needed (for pain.).   Current Outpatient Medications (Other):    augmented betamethasone dipropionate (DIPROLENE-AF) 0.05 % ointment, USE AS DIRECTED. DO NOT USE MORE THAN 5 DAYS IN A ROW OR MORE THAN 7 DAYS EACH MONTH.   CALCIUM PO, Take 1 tablet by mouth at bedtime.    estradiol (ESTRACE VAGINAL) 0.1 MG/GM vaginal cream, INSERT 1 GRAM VAGINALLY TWICE WEEKLY   Multiple Vitamins-Minerals (PRESERVISION AREDS 2 PO), Take 1 tablet by mouth 2 (two) times daily.   Reviewed prior external information including notes and imaging from  primary care provider As well as notes that were available from care everywhere and other healthcare systems.  Past medical history, social, surgical and family history all reviewed in electronic medical record.  No pertanent information unless stated regarding to the chief complaint.   Review of Systems:  No headache, visual changes, nausea, vomiting, diarrhea, constipation, dizziness, abdominal pain, skin rash, fevers, chills, night sweats,  weight loss, swollen lymph nodes, body aches, joint swelling, chest pain, shortness of breath, mood changes. POSITIVE muscle aches  Objective  Blood pressure 120/82, pulse 75, height 4\' 11"  (1.499 m), weight 216 lb (98 kg), last menstrual period 02/12/2011, SpO2 98 %.   General: No apparent distress alert and oriented x3 mood and affect normal, dressed appropriately.  HEENT: Pupils equal, extraocular movements intact  Respiratory: Patient's speak in full sentences and does not appear short of breath  Cardiovascular: No lower extremity edema, non tender, no erythema  Gait normal with good balance and coordination.  MSK: Patient back exam still has significant loss of lordosis.  Still does have some tightness noted but improvement in [place] and straight leg test.  Patient still lacks more than 5 degrees of extension of the back.  Neurovascularly intact distally.  Moderate to severe tenderness noted over the greater trochanteric area in the gluteal area on the right side.  After verbal consent patient was prepped with alcohol swabs and with a 21-gauge 2 inch needle injected into the right gluteal tendon.  Near the greater trochanteric area with a total of 2 cc of 0.5% Marcaine and 1 cc of Kenalog 40 mg/mL.  No blood loss.  Postinjection instructions given after Band-Aid placed.    Impression and Recommendations:     The above documentation has been reviewed and is accurate and complete Lyndal Pulley, DO

## 2021-07-15 ENCOUNTER — Other Ambulatory Visit: Payer: Self-pay

## 2021-07-15 ENCOUNTER — Encounter: Payer: Self-pay | Admitting: Family Medicine

## 2021-07-15 ENCOUNTER — Ambulatory Visit (INDEPENDENT_AMBULATORY_CARE_PROVIDER_SITE_OTHER): Payer: Medicare Other | Admitting: Family Medicine

## 2021-07-15 DIAGNOSIS — M7601 Gluteal tendinitis, right hip: Secondary | ICD-10-CM | POA: Diagnosis not present

## 2021-07-15 DIAGNOSIS — I2584 Coronary atherosclerosis due to calcified coronary lesion: Secondary | ICD-10-CM

## 2021-07-15 DIAGNOSIS — I2510 Atherosclerotic heart disease of native coronary artery without angina pectoris: Secondary | ICD-10-CM

## 2021-07-15 DIAGNOSIS — M5416 Radiculopathy, lumbar region: Secondary | ICD-10-CM | POA: Diagnosis not present

## 2021-07-15 NOTE — Assessment & Plan Note (Signed)
Injected  Discussed HEP  Discussed which activities to do and do think some lumbar radiculopathy  RTC in 2 months

## 2021-07-15 NOTE — Assessment & Plan Note (Signed)
Epidural 7/27 Lumbar radiculopathy from spinal stenosis  Much approved since injected  HEP  Encouraged weight loss  RTC in 8 weeks

## 2021-07-15 NOTE — Patient Instructions (Addendum)
Good to see you  Good to go shoe shopping Keep up with everything else See me again in 2 month

## 2021-07-16 ENCOUNTER — Other Ambulatory Visit: Payer: Self-pay

## 2021-07-16 ENCOUNTER — Encounter (INDEPENDENT_AMBULATORY_CARE_PROVIDER_SITE_OTHER): Payer: Self-pay | Admitting: Adult Health

## 2021-07-16 ENCOUNTER — Ambulatory Visit (INDEPENDENT_AMBULATORY_CARE_PROVIDER_SITE_OTHER): Payer: Medicare Other | Admitting: Adult Health

## 2021-07-16 VITALS — BP 114/73 | HR 69 | Temp 97.5°F | Ht 59.0 in | Wt 211.0 lb

## 2021-07-16 DIAGNOSIS — K5904 Chronic idiopathic constipation: Secondary | ICD-10-CM

## 2021-07-16 DIAGNOSIS — I2584 Coronary atherosclerosis due to calcified coronary lesion: Secondary | ICD-10-CM

## 2021-07-16 DIAGNOSIS — I10 Essential (primary) hypertension: Secondary | ICD-10-CM

## 2021-07-16 DIAGNOSIS — E559 Vitamin D deficiency, unspecified: Secondary | ICD-10-CM

## 2021-07-16 DIAGNOSIS — R7303 Prediabetes: Secondary | ICD-10-CM | POA: Diagnosis not present

## 2021-07-16 DIAGNOSIS — I2510 Atherosclerotic heart disease of native coronary artery without angina pectoris: Secondary | ICD-10-CM

## 2021-07-16 DIAGNOSIS — Z6841 Body Mass Index (BMI) 40.0 and over, adult: Secondary | ICD-10-CM

## 2021-07-16 NOTE — Progress Notes (Signed)
[facility] Summary Report / Loop Recorder 

## 2021-07-17 NOTE — Progress Notes (Signed)
Chief Complaint:   OBESITY Nichole Smith is here to discuss her progress with her obesity treatment plan along with follow-up of her obesity related diagnoses. Nichole Smith is on keeping a food journal and adhering to recommended goals of 1500 calories and 95 grams of protein daily and states she is following her eating plan approximately 100% of the time. Nichole Smith states she is walking, swimming, and doing strengthening for 40 minutes 7 times per week.  Today's visit was #: 69 Starting weight: 238 lbs Starting date: 01/12/2019 Today's weight: 211 lbs Today's date: 07/16/2021 Total lbs lost to date: 27 Total lbs lost since last in-office visit: 0  Interim History: Nichole Smith's Linzess was denied by her insurance in May 2022.  She recently stopped all OTC stool softeners, and increased her water and fiber in her diet.  Her calorie average has been 1630 over the last few weeks- her prescribed goal is 1500 cal/day.  Subjective:   1. Essential hypertension Fiorela's blood pressure and heart rate are excellent at her office visit today. I discussed labs with the patient today.  2. Vitamin D deficiency Nichole Smith's last Vit D level on 06/25/2020 was 53.1, at goal. She is on multivitamins 1 tablet BID. I discussed labs with the patient today.  3. Pre-diabetes Nichole Smith's BG was 83, A1c 53, at goal, and insulin level was slightly elevated at 12.2 on 06/25/2020. She is on Trulicity 3 mg once weekly. I discussed labs with the patient today.  4. Chronic idiopathic constipation Nichole Smith's Linzess was denied by her insurance in May 2022.  She recently stopped all OTC stool softeners, and increased her water and fiber in her diet.    Assessment/Plan:   1. Essential hypertension Nichole Smith will continue her current anti-hypertensive regime, and she will continue working on healthy weight loss and exercise to improve blood pressure control. We will watch for signs of hypotension as she continues her lifestyle modifications.  2.  Vitamin D deficiency Low Vitamin D level contributes to fatigue and are associated with obesity, breast, and colon cancer. Geneen agreed to continue OTC Vitamin D supplementation and will follow-up for routine testing of Vitamin D, at least 2-3 times per year to avoid over-replacement.  3. Pre-diabetes Nichole Smith will continue Trulicity 3 mg once week, and will continue to work on weight loss, exercise, and decreasing simple carbohydrates to help decrease the risk of diabetes.   4. Chronic idiopathic constipation Nichole Smith was informed that a decrease in bowel movement frequency is normal while losing weight, but stools should not be hard or painful. She will continue increasing her water and increasing fiber and will walk after meals. Orders and follow up as documented in patient record.   Counseling Getting to Good Bowel Health: Your goal is to have one soft bowel movement each day. Drink at least 8 glasses of water each day. Eat plenty of fiber (goal is over 25 grams each day). It is best to get most of your fiber from dietary sources which includes leafy green vegetables, fresh fruit, and whole grains. You may need to add fiber with the help of OTC fiber supplements. These include Metamucil, Citrucel, and Flaxseed. If you are still having trouble, try adding Miralax or Magnesium Citrate. If all of these changes do not work, Cabin crew.  5. Class 3 severe obesity with serious comorbidity and body mass index (BMI) of 40.0 to 44.9 in adult, unspecified obesity type [facility]) Nichole Smith is currently in the action stage of change. As such, her  goal is to continue with weight loss efforts. She has agreed to keeping a food journal and adhering to recommended goals of 1500 calories and 95 grams of protein daily.   Exercise goals: As is.  Behavioral modification strategies: increasing lean protein intake, decreasing simple carbohydrates, increasing water intake, meal planning and cooking strategies, keeping  healthy foods in the home, planning for success, and keeping a strict food journal.  Nichole Smith has agreed to follow-up with our clinic in 2 weeks with Dr. Leafy Ro. She was informed of the importance of frequent follow-up visits to maximize her success with intensive lifestyle modifications for her multiple health conditions.   Objective:   Blood pressure 114/73, pulse 69, temperature (!) 97.5 F (36.4 C), height 4\' 11"  (1.499 m), weight 211 lb (95.7 kg), last menstrual period 02/12/2011, SpO2 95 %. Body mass index is 42.62 kg/m.  General: Cooperative, alert, well developed, in no acute distress. HEENT: Conjunctivae and lids unremarkable. Cardiovascular: Regular rhythm.  Lungs: Normal work of breathing. Neurologic: No focal deficits.   Lab Results  Component Value Date   CREATININE 0.56 (L) 06/25/2021   BUN 23 06/25/2021   NA 141 06/25/2021   K 3.9 06/25/2021   CL 99 06/25/2021   CO2 28 06/25/2021   Lab Results  Component Value Date   ALT 27 06/25/2021   AST 24 06/25/2021   ALKPHOS 70 06/25/2021   BILITOT 0.4 06/25/2021   Lab Results  Component Value Date   HGBA1C 5.3 06/25/2021   HGBA1C 5.4 06/27/2020   HGBA1C 5.5 01/02/2020   HGBA1C 5.6 08/22/2019   HGBA1C 5.9 (H) 01/12/2019   Lab Results  Component Value Date   INSULIN 12.2 06/25/2021   INSULIN 13.7 06/27/2020   INSULIN 13.5 01/02/2020   INSULIN 21.5 08/22/2019   INSULIN 20.4 01/12/2019   Lab Results  Component Value Date   TSH 0.98 07/31/2020   Lab Results  Component Value Date   CHOL 117 01/02/2020   HDL 53 01/02/2020   LDLCALC 51 01/02/2020   TRIG 57 01/02/2020   CHOLHDL 2 06/07/2018   Lab Results  Component Value Date   VD25OH 53.1 06/25/2021   VD25OH 51.9 06/27/2020   VD25OH 63.9 01/02/2020   Lab Results  Component Value Date   WBC 8.1 06/27/2020   HGB 15.9 06/27/2020   HCT 47.9 (H) 06/27/2020   MCV 94 06/27/2020   PLT 243 06/27/2020   No results found for: IRON, TIBC, FERRITIN  Obesity  Behavioral Intervention:   Approximately 15 minutes were spent on the discussion below.  ASK: We discussed the diagnosis of obesity with Nichole Smith today and Nichole Smith agreed to give [name] permission to discuss obesity behavioral modification therapy today.  ASSESS: Nichole Smith has the diagnosis of obesity and her BMI today is 42.6. Nichole Smith is in the action stage of change.   ADVISE: Nichole Smith was educated on the multiple health risks of obesity as well as the benefit of weight loss to improve her health. She was advised of the need for long term treatment and the importance of lifestyle modifications to improve her current health and to decrease her risk of future health problems.  AGREE: Multiple dietary modification options and treatment options were discussed and Nichole Smith agreed to follow the recommendations documented in the above note.  ARRANGE: Nichole Smith was educated on the importance of frequent visits to treat obesity as outlined per CMS and USPSTF guidelines and agreed to schedule her next follow up appointment today.  Attestation Statements:   Reviewed  by clinician on day of visit: allergies, medications, problem list, medical history, surgical history, family history, social history, and previous encounter notes.   [name], am acting as transcriptionist for Mina Marble, NP.  I have reviewed the above documentation for accuracy and completeness, and I agree with the above. - Leandre Wien d. Sarai January, NP-C

## 2021-07-22 ENCOUNTER — Ambulatory Visit (INDEPENDENT_AMBULATORY_CARE_PROVIDER_SITE_OTHER): Payer: Medicare Other | Admitting: Internal Medicine

## 2021-07-22 ENCOUNTER — Other Ambulatory Visit: Payer: Self-pay

## 2021-07-22 VITALS — BP 126/70 | HR 78 | Ht 59.0 in | Wt 218.8 lb

## 2021-07-22 DIAGNOSIS — I2510 Atherosclerotic heart disease of native coronary artery without angina pectoris: Secondary | ICD-10-CM

## 2021-07-22 DIAGNOSIS — I2584 Coronary atherosclerosis due to calcified coronary lesion: Secondary | ICD-10-CM

## 2021-07-22 DIAGNOSIS — I480 Paroxysmal atrial fibrillation: Secondary | ICD-10-CM

## 2021-07-22 MED ORDER — DILTIAZEM HCL 30 MG PO TABS: ORAL_TABLET | ORAL | 3 refills | Status: DC

## 2021-07-22 NOTE — Progress Notes (Signed)
PCP: Burnis Medin, MD Primary Cardiologist: Dr Radford Pax Primary EP: Dr Rayann Heman  Nichole Smith is a 65 y.o. female who presents today for routine electrophysiology followup.  Since last being seen in our clinic, the patient reports doing very well.  Today, she denies symptoms of palpitations, chest pain, shortness of breath,  lower extremity edema, dizziness, presyncope, or syncope.  The patient is otherwise without complaint today.   Past Medical History:  Diagnosis Date   Arthritis    knees, back   Bursitis    hips - almost gone per pt    Cancer ([place])    Carcinoid tumor of lung 2012   right found incidentally on chest xray eval for r/o vasxculitis    Carpal tunnel syndrome, bilateral    bilat but surgery to correct    Coronary artery calcification 07/23/2016   Double vision    history, no current problem   Dysrhythmia    irregular occassional, no current problem   Frequent UTI    HNP (herniated nucleus pulposus), cervical 01/22/2011   C6-C7  Has seen dr Vertell Limber for this and Dr Tonita Cong  Positional numbness in hands no weakness     Hypertension    Hypothyroidism    Infertility, female    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  Download 08/2014:  Good compliance, breakthru apnea on 10cm.  Changed to auto 5-12cm >> good control of AHI on f/u download ONO on CPAP:       Osteoarthritis    PCOS (polycystic ovarian syndrome)    Sleep apnea    wears cpap    SUI (stress urinary incontinence, female)    h/o   Past Surgical History:  Procedure Laterality Date   ANTERIOR CERVICAL DECOMP/DISCECTOMY FUSION  12/24/2017   Procedure: Cervical five-six Cervical six-seven Anterior cervical decompression/discectomy/fusion;  Surgeon: Erline Levine, MD;  Location: [place];  Service: Neurosurgery;;   CARPAL TUNNEL RELEASE Left 12/24/2017   Procedure: LEFT CARPAL TUNNEL RELEASE;  Surgeon: Erline Levine, MD;   Location: [place];  Service: Neurosurgery;  Laterality: Left;   CESAREAN SECTION     x 1 - twins   COLONOSCOPY     DILATATION & CURRETTAGE/HYSTEROSCOPY WITH RESECTOCOPE N/A 07/18/2013   Procedure: [place];  Surgeon: Lyman Speller, MD;  Location: [facility];  Service: Gynecology;  Laterality: N/A;  mass resection   implantable loop recorder placement  05/20/2021   Medtronic Reveal Linq model LNQ22 implantable loop recorder (SN [id number])   Lung tumor removed     Rt lung carcinoid   MOUTH SURGERY     pre-cancerous ulcer removed   POLYPECTOMY     SHOULDER OPEN ROTATOR CUFF REPAIR  07/20/2010   Right   WISDOM TOOTH EXTRACTION      ROS- all systems are reviewed and negatives except as per HPI above  Current Outpatient Medications  Medication Sig Dispense Refill   aspirin EC 81 MG tablet Take 81 mg by mouth at bedtime.     atorvastatin (LIPITOR) 40 MG tablet TAKE 1 TABLET DAILY 90 tablet 3   augmented betamethasone dipropionate (DIPROLENE-AF) 0.05 % ointment USE AS DIRECTED. DO NOT USE MORE THAN 5 DAYS IN A ROW OR MORE THAN 7 DAYS EACH MONTH. 45 g 0   CALCIUM PO Take 1 tablet by mouth at bedtime.      chlorthalidone (HYGROTON) 25 MG tablet Take 1 tablet (  25 mg total) by mouth daily. 90 tablet 3   diltiazem (CARDIZEM CD) 180 MG 24 hr capsule Take 1 capsule (180 mg total) by mouth daily. 90 capsule 3   estradiol (ESTRACE VAGINAL) 0.1 MG/GM vaginal cream INSERT 1 GRAM VAGINALLY TWICE WEEKLY 42.5 g 2   metoprolol succinate (TOPROL-XL) 50 MG 24 hr tablet Take 1 tablet (50 mg total) by mouth daily. 90 tablet 3   Multiple Vitamins-Minerals (PRESERVISION AREDS 2 PO) Take 1 tablet by mouth 2 (two) times daily.     naproxen sodium (ALEVE) 220 MG tablet Take 220 mg by mouth 2 (two) times daily as needed (for pain.).     SYNTHROID 137 MCG tablet TAKE 1 TABLET DAILY (NEED TO SCHEDULE AN APPOINTMENT FOR MORE REFILLS) 90 tablet 0   TRULICITY 3 ZD/6.3OV SOPN  INJECT 3 MG ONCE A WEEK AS DIRECTED 6 mL 3   No current facility-administered medications for this visit.    Physical Exam: Vitals:   07/22/21 1409  BP: 126/70  Pulse: 78  SpO2: 96%  Weight: 218 lb 12.8 oz (99.2 kg)  Height: 4\' 11"  (1.499 m)    GEN- The patient is well appearing, alert and oriented x 3 today.   Head- normocephalic, atraumatic Eyes-  Sclera clear, conjunctiva pink Ears- hearing intact Oropharynx- clear Lungs- Clear to ausculation bilaterally, normal work of breathing Heart- Regular rate and rhythm, no murmurs, rubs or gallops, PMI not laterally displaced GI- soft, NT, ND, + BS Extremities- no clubbing, cyanosis, or edema  Wt Readings from Last 3 Encounters:  07/22/21 218 lb 12.8 oz (99.2 kg)  07/16/21 211 lb (95.7 kg)  07/15/21 216 lb (98 kg)    EKG tracing ordered today is personally reviewed and shows sinus  Assessment and Plan:  Paroxysmal atrial fibrillation Well controlled (burden 0.2% by ILR)  Chads2vasc score is 3.  She takes ASA due to low  AF burden. She is given diltiazem 30mg  q6 hr prn  2. HTN Stable No change required today  3. Obesity Body mass index is 44.19 kg/m. Lifestyle modification advised  Risks, benefits and potential toxicities for medications prescribed and/or refilled reviewed with patient today.   She wishes to return in a year  Thompson Grayer MD, [facility] 07/22/2021 2:22 PM

## 2021-07-22 NOTE — Patient Instructions (Addendum)
Medication Instructions:  Start Cardizem 30 mg every 6 hours as needed for palpitations or heart rate over 100.  Your physician recommends that you continue on your current medications as directed. Please refer to the Current Medication list given to you today.  Labwork: None ordered.  Testing/Procedures: None ordered.  Follow-Up: Your physician wants you to follow-up in: 07/28/22 at 9:30 am Thompson Grayer, MD    Any Other Special Instructions Will Be Listed Below (If Applicable).  If you need a refill on your cardiac medications before your next appointment, please call your pharmacy.

## 2021-07-29 ENCOUNTER — Encounter (HOSPITAL_BASED_OUTPATIENT_CLINIC_OR_DEPARTMENT_OTHER): Payer: Self-pay | Admitting: Obstetrics & Gynecology

## 2021-07-29 ENCOUNTER — Other Ambulatory Visit: Payer: Self-pay

## 2021-07-29 ENCOUNTER — Ambulatory Visit (INDEPENDENT_AMBULATORY_CARE_PROVIDER_SITE_OTHER): Payer: Medicare Other | Admitting: Obstetrics & Gynecology

## 2021-07-29 ENCOUNTER — Ambulatory Visit (INDEPENDENT_AMBULATORY_CARE_PROVIDER_SITE_OTHER): Payer: Medicare Other

## 2021-07-29 VITALS — BP 122/79 | HR 72 | Ht 59.0 in | Wt 213.2 lb

## 2021-07-29 DIAGNOSIS — A609 Anogenital herpesviral infection, unspecified: Secondary | ICD-10-CM

## 2021-07-29 DIAGNOSIS — Z1231 Encounter for screening mammogram for malignant neoplasm of breast: Secondary | ICD-10-CM

## 2021-07-29 DIAGNOSIS — I480 Paroxysmal atrial fibrillation: Secondary | ICD-10-CM

## 2021-07-29 DIAGNOSIS — M8580 Other specified disorders of bone density and structure, unspecified site: Secondary | ICD-10-CM

## 2021-07-29 DIAGNOSIS — Z01419 Encounter for gynecological examination (general) (routine) without abnormal findings: Secondary | ICD-10-CM

## 2021-07-29 DIAGNOSIS — M8588 Other specified disorders of bone density and structure, other site: Secondary | ICD-10-CM

## 2021-07-29 DIAGNOSIS — K5904 Chronic idiopathic constipation: Secondary | ICD-10-CM

## 2021-07-29 DIAGNOSIS — R399 Unspecified symptoms and signs involving the genitourinary system: Secondary | ICD-10-CM

## 2021-07-29 DIAGNOSIS — Z9189 Other specified personal risk factors, not elsewhere classified: Secondary | ICD-10-CM | POA: Diagnosis not present

## 2021-07-29 DIAGNOSIS — N816 Rectocele: Secondary | ICD-10-CM

## 2021-07-29 DIAGNOSIS — R002 Palpitations: Secondary | ICD-10-CM

## 2021-07-29 MED ORDER — NITROFURANTOIN MONOHYD MACRO 100 MG PO CAPS: 100.0000 mg | ORAL_CAPSULE | Freq: Every day | ORAL | 3 refills | Status: DC

## 2021-07-29 MED ORDER — ESTRADIOL 0.1 MG/GM VA CREA: TOPICAL_CREAM | VAGINAL | 3 refills | Status: DC

## 2021-07-29 MED ORDER — SULFAMETHOXAZOLE-TRIMETHOPRIM 800-160 MG PO TABS: 1.0000 | ORAL_TABLET | Freq: Two times a day (BID) | ORAL | 2 refills | Status: DC

## 2021-07-29 NOTE — Progress Notes (Addendum)
65 y.o. G2P2 Married White or Caucasian female here for breast and pelvic exam.  I am also following her for h/o recurrent UTIs.  She had a UTI in April, end of May, and August 11.  She does have urology appt.  Discussed with pt suppressive therapy.  We did this for six months in the past and she did not have any UTIs during that very.  Very willing to proceed.  Denies vaginal bleeding.  Vulvar itching is under better control.  Switched products for incontinence and this has really helped.  Does not need refill for steroid ointment.  Still working on [employer] with [facility].  Lastly, having some issues with constipation and then bowel movements that feel "stuck".  Tries not to strain.  Feels like just can't get stool out and has had to manually remove.  Wants suggestions.  Patient's last menstrual period was 02/12/2011.          Sexually active: Yes.    H/O STD:  no  Health Maintenance: PCP:  Dr. Regis Bill.  Last wellness appt was 07/31/2020.  Did blood work at that appt:  05/2021 Vaccines are up to date:  yes Colonoscopy:  09/25/2020, h/o polyps. MMG:  08/22/2019 Negative BMD:  order sent Last pap smear:  07/26/2020 Negative.   H/o abnormal pap smear:  no   reports that she has never smoked. She has never used smokeless tobacco. She reports that she does not currently use alcohol. She reports that she does not use drugs.  Past Medical History:  Diagnosis Date   Arthritis    knees, back   Bursitis    hips - almost gone per pt    Cancer ([place])    Carcinoid tumor of lung 2012   right found incidentally on chest xray eval for r/o vasxculitis    Carpal tunnel syndrome, bilateral    bilat but surgery to correct    Coronary artery calcification 07/23/2016   Double vision    history, no current problem   Dysrhythmia    irregular occassional, no current problem   Frequent UTI    HNP (herniated nucleus pulposus), cervical 01/22/2011   C6-C7  Has seen dr Vertell Limber for this and Dr  Tonita Cong  Positional numbness in hands no weakness     Hypertension    Hypothyroidism    Infertility, female    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  Download 08/2014:  Good compliance, breakthru apnea on 10cm.  Changed to auto 5-12cm >> good control of AHI on f/u download ONO on CPAP:       Osteoarthritis    PCOS (polycystic ovarian syndrome)    Sleep apnea    wears cpap    SUI (stress urinary incontinence, female)    h/o    Past Surgical History:  Procedure Laterality Date   ANTERIOR CERVICAL DECOMP/DISCECTOMY FUSION  12/24/2017   Procedure: Cervical five-six Cervical six-seven Anterior cervical decompression/discectomy/fusion;  Surgeon: Erline Levine, MD;  Location: [place];  Service: Neurosurgery;;   CARPAL TUNNEL RELEASE Left 12/24/2017   Procedure: LEFT CARPAL TUNNEL RELEASE;  Surgeon: Erline Levine, MD;  Location: [place];  Service: Neurosurgery;  Laterality: Left;   CESAREAN SECTION     x 1 - twins   COLONOSCOPY     DILATATION & CURRETTAGE/HYSTEROSCOPY WITH RESECTOCOPE N/A 07/18/2013   Procedure: [place];  Surgeon: Lyman Speller, MD;  Location: [facility];  Service: Gynecology;  Laterality: N/A;  mass resection   implantable loop recorder placement  05/20/2021   Medtronic Reveal Linq model LNQ22 implantable loop recorder (SN [id number])   Lung tumor removed     Rt lung carcinoid   MOUTH SURGERY     pre-cancerous ulcer removed   POLYPECTOMY     SHOULDER OPEN ROTATOR CUFF REPAIR  07/20/2010   Right   WISDOM TOOTH EXTRACTION      Current Outpatient Medications  Medication Sig Dispense Refill   aspirin EC 81 MG tablet Take 81 mg by mouth at bedtime.     atorvastatin (LIPITOR) 40 MG tablet TAKE 1 TABLET DAILY 90 tablet 3   augmented betamethasone dipropionate (DIPROLENE-AF) 0.05 % ointment USE AS DIRECTED. DO NOT USE MORE THAN 5 DAYS IN A ROW OR  MORE THAN 7 DAYS EACH MONTH. 45 g 0   CALCIUM PO Take 1 tablet by mouth at bedtime.      chlorthalidone (HYGROTON) 25 MG tablet Take 1 tablet (25 mg total) by mouth daily. 90 tablet 3   diltiazem (CARDIZEM CD) 180 MG 24 hr capsule Take 1 capsule (180 mg total) by mouth daily. 90 capsule 3   diltiazem (CARDIZEM) 30 MG tablet 1 tablet every 6 hours for palpitations 60 tablet 3   estradiol (ESTRACE VAGINAL) 0.1 MG/GM vaginal cream INSERT 1 GRAM VAGINALLY TWICE WEEKLY 42.5 g 2   metoprolol succinate (TOPROL-XL) 50 MG 24 hr tablet Take 1 tablet (50 mg total) by mouth daily. 90 tablet 3   Multiple Vitamins-Minerals (PRESERVISION AREDS 2 PO) Take 1 tablet by mouth 2 (two) times daily.     naproxen sodium (ALEVE) 220 MG tablet Take 220 mg by mouth 2 (two) times daily as needed (for pain.).     SYNTHROID 137 MCG tablet TAKE 1 TABLET DAILY (NEED TO SCHEDULE AN APPOINTMENT FOR MORE REFILLS) 90 tablet 0   TRULICITY 3 QP/6.1PJ SOPN INJECT 3 MG ONCE A WEEK AS DIRECTED 6 mL 3   No current facility-administered medications for this visit.    Family History  Problem Relation Age of Onset   Stroke Mother        died age 14   Hypertension Mother    Sudden death Mother    Alcoholism Mother    Coronary artery disease Father        died age 38   Hypertension Father    Liver disease Father    Alcohol abuse Father    Obesity Father    Parkinson's disease Brother    Diabetes Brother    Hypertension Brother    Hypertension Maternal Uncle    Colon cancer Neg Hx    Rectal cancer Neg Hx    Stomach cancer Neg Hx    Colon polyps Neg Hx    Esophageal cancer Neg Hx     Review of Systems  Constitutional: Negative.   Cardiovascular: Negative.   Gastrointestinal: Negative.    Exam:   BP 122/79 (BP Location: Right Arm, Patient Position: Sitting, Cuff Size: Large)   Pulse 72   Ht 4\' 11"  (1.499 m)   Wt 213 lb 3.2 oz (96.7 kg)   LMP 02/12/2011   BMI 43.06 kg/m   Height: 4\' 11"  (149.9 cm)  General  appearance: alert, cooperative and appears stated age Breasts: normal appearance, no masses or tenderness Abdomen: soft, non-tender; bowel sounds normal; no masses,  no organomegaly Lymph nodes: Cervical, supraclavicular, and axillary nodes normal.  No abnormal  inguinal nodes palpated Neurologic: Grossly normal  Pelvic: External genitalia:  no lesions              Urethra:  normal appearing urethra with no masses, tenderness or lesions              Bartholins and Skenes: normal                 Vagina: normal appearing vagina with atrophic changes and no discharge, no lesions, rectocele present              Cervix: no lesions              Pap taken: No. Bimanual Exam:  Uterus:  normal size, contour, position, consistency, mobility, non-tender              Adnexa: normal adnexa and no mass, fullness, tenderness               Rectovaginal: Confirms               Anus:  normal sphincter tone, no lesions  Chaperone, Octaviano Batty, CMA, was present for exam.  Assessment/Plan: 1. GYN exam for high-risk Medicare patient - pap smear obtained 07/26/2020 - MMG is overdue.  Last was 08/2019 - BMD order will be placed - colonoscopy 09/25/2020, h/o polyps - screening lab work done with Dr. Regis Bill - vaccines reviewed/updated  2. Urinary tract infection symptoms - for suppressive therapy will start:  nitrofurantoin, macrocrystal-monohydrate, (MACROBID) 100 MG capsule; Take 1 capsule (100 mg total) by mouth daily.  Dispense: 90 capsule; Refill: 3 - If pt has breakthrough UTI, will use:  sulfamethoxazole-trimethoprim (BACTRIM DS) 800-160 MG tablet; Take 1 tablet by mouth 2 (two) times daily.  Dispense: 6 tablet; Refill: 2  3. Encounter for screening mammogram for malignant neoplasm of breast - MM 3D SCREEN BREAST BILATERAL; Future  4. Osteopenia, unspecified location - DG BONE DENSITY (DXA); Future  5. HSV (herpes simplex virus) anogenital infection - not on any therapy  6. Chronic idiopathic  constipation - Miralax discussed - Manual assisting with defecation discussion  7. Rectocele - has appt with Dr. Matilde Sprang coming up.  May need to discussed surgical repair at some point

## 2021-07-30 ENCOUNTER — Ambulatory Visit (INDEPENDENT_AMBULATORY_CARE_PROVIDER_SITE_OTHER): Payer: Medicare Other | Admitting: Internal Medicine

## 2021-07-30 ENCOUNTER — Encounter: Payer: Self-pay | Admitting: Internal Medicine

## 2021-07-30 ENCOUNTER — Ambulatory Visit (HOSPITAL_BASED_OUTPATIENT_CLINIC_OR_DEPARTMENT_OTHER)
Admission: RE | Admit: 2021-07-30 | Discharge: 2021-07-30 | Disposition: A | Discharge status: Home / Self Care | Payer: Medicare Other | Source: Ambulatory Visit | Attending: Internal Medicine | Admitting: Internal Medicine

## 2021-07-30 VITALS — BP 134/80 | HR 71 | Temp 97.8°F | Ht 59.0 in | Wt 214.4 lb

## 2021-07-30 DIAGNOSIS — Z6841 Body Mass Index (BMI) 40.0 and over, adult: Secondary | ICD-10-CM

## 2021-07-30 DIAGNOSIS — I2584 Coronary atherosclerosis due to calcified coronary lesion: Secondary | ICD-10-CM

## 2021-07-30 DIAGNOSIS — E063 Autoimmune thyroiditis: Secondary | ICD-10-CM | POA: Diagnosis not present

## 2021-07-30 DIAGNOSIS — E7849 Other hyperlipidemia: Secondary | ICD-10-CM | POA: Diagnosis not present

## 2021-07-30 DIAGNOSIS — R7303 Prediabetes: Secondary | ICD-10-CM | POA: Diagnosis not present

## 2021-07-30 DIAGNOSIS — E8881 Metabolic syndrome: Secondary | ICD-10-CM

## 2021-07-30 DIAGNOSIS — I10 Essential (primary) hypertension: Secondary | ICD-10-CM

## 2021-07-30 DIAGNOSIS — E038 Other specified hypothyroidism: Secondary | ICD-10-CM

## 2021-07-30 DIAGNOSIS — C7A090 Malignant carcinoid tumor of the bronchus and lung: Secondary | ICD-10-CM | POA: Diagnosis present

## 2021-07-30 DIAGNOSIS — Z79899 Other long term (current) drug therapy: Secondary | ICD-10-CM

## 2021-07-30 DIAGNOSIS — G4733 Obstructive sleep apnea (adult) (pediatric): Secondary | ICD-10-CM

## 2021-07-30 DIAGNOSIS — I480 Paroxysmal atrial fibrillation: Secondary | ICD-10-CM

## 2021-07-30 DIAGNOSIS — I2510 Atherosclerotic heart disease of native coronary artery without angina pectoris: Secondary | ICD-10-CM

## 2021-07-30 DIAGNOSIS — K5904 Chronic idiopathic constipation: Secondary | ICD-10-CM

## 2021-07-30 LAB — CBC WITH DIFFERENTIAL/PLATELET
Basophils Absolute: 0.1 10*3/uL (ref 0.0–0.1)
Basophils Relative: 0.6 % (ref 0.0–3.0)
Eosinophils Absolute: 0.1 10*3/uL (ref 0.0–0.7)
Eosinophils Relative: 1.6 % (ref 0.0–5.0)
HCT: 45.2 % (ref 36.0–46.0)
Hemoglobin: 15.3 g/dL — ABNORMAL HIGH (ref 12.0–15.0)
Lymphocytes Relative: 24.5 % (ref 12.0–46.0)
Lymphs Abs: 2.1 10*3/uL (ref 0.7–4.0)
MCHC: 33.8 g/dL (ref 30.0–36.0)
MCV: 93.9 fl (ref 78.0–100.0)
Monocytes Absolute: 0.8 10*3/uL (ref 0.1–1.0)
Monocytes Relative: 8.7 % (ref 3.0–12.0)
Neutro Abs: 5.6 10*3/uL (ref 1.4–7.7)
Neutrophils Relative %: 64.6 % (ref 43.0–77.0)
Platelets: 239 10*3/uL (ref 150.0–400.0)
RBC: 4.81 Mil/uL (ref 3.87–5.11)
RDW: 13.3 % (ref 11.5–15.5)
WBC: 8.7 10*3/uL (ref 4.0–10.5)

## 2021-07-30 LAB — HEPATIC FUNCTION PANEL
ALT: 30 U/L (ref 0–35)
AST: 23 U/L (ref 0–37)
Albumin: 3.6 g/dL (ref 3.5–5.2)
Alkaline Phosphatase: 56 U/L (ref 39–117)
Bilirubin, Direct: 0.2 mg/dL (ref 0.0–0.3)
Total Bilirubin: 0.7 mg/dL (ref 0.2–1.2)
Total Protein: 6.6 g/dL (ref 6.0–8.3)

## 2021-07-30 LAB — CUP PACEART REMOTE DEVICE CHECK
Date Time Interrogation Session: 20220825135032
Implantable Pulse Generator Implant Date: 20220620

## 2021-07-30 LAB — LIPID PANEL
Cholesterol: 121 mg/dL (ref 0–200)
HDL: 52.5 mg/dL (ref >39.00)
LDL Cholesterol: 59 mg/dL (ref 0–99)
NonHDL: 68.64
Total CHOL/HDL Ratio: 2
Triglycerides: 49 mg/dL (ref 0.0–149.0)
VLDL: 9.8 mg/dL (ref 0.0–40.0)

## 2021-07-30 LAB — TSH: TSH: 2.26 u[IU]/mL (ref 0.35–5.50)

## 2021-07-30 NOTE — Progress Notes (Signed)
X ray  shows no acute finding . No action needed repeat in a year

## 2021-07-30 NOTE — Progress Notes (Signed)
Chief Complaint  Patient presents with   Medication Refill    HPI: Nichole Smith 65 y.o. come in for Chronic disease management   Weight management : Ongoing has plateaued after the remission of 30 pound weight loss thinking of continuing on Asks about medication she is currently on Trulicity and metformin  BP controlled at this time LIPIDS on medication to follow-up cardiology in the future Needs routinely   yearly chest x-ray for her history of carcinoid of the lung Constipation ongoing but so bad now ongoing. on hard  and  expulsion.  Had pelvic  yesterday .  Takes colace per day . Water 80 oz  and fiber  30  gm per day plus .  Still not controlled was offered Linzess in the past but insurance would not cover it. Sees urologist for recurrent UTI to start on [place] is given Septra for as needed infection. Up-to-date on colon cancer screening. Battling back predicament sciatica. Has macular degeneration vision is stable. Saw GYN yesterday but visit cut short or interrupted by a fire alarm. ROS: See pertinent positives and negatives per HPI.  Past Medical History:  Diagnosis Date   Arthritis    knees, back   Bursitis    hips - almost gone per pt    Cancer ([place])    Carcinoid tumor of lung 2012   right found incidentally on chest xray eval for r/o vasxculitis    Carpal tunnel syndrome, bilateral    bilat but surgery to correct    Coronary artery calcification 07/23/2016   Double vision    history, no current problem   Dysrhythmia    irregular occassional, no current problem   Frequent UTI    HNP (herniated nucleus pulposus), cervical 01/22/2011   C6-C7  Has seen dr Vertell Limber for this and Dr Tonita Cong  Positional numbness in hands no weakness     Hypertension    Hypothyroidism    Infertility, female    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  Download 08/2014:  Good compliance, breakthru  apnea on 10cm.  Changed to auto 5-12cm >> good control of AHI on f/u download ONO on CPAP:       Osteoarthritis    PCOS (polycystic ovarian syndrome)    Sleep apnea    wears cpap    SUI (stress urinary incontinence, female)    h/o    Family History  Problem Relation Age of Onset   Stroke Mother        died age 70   Hypertension Mother    Sudden death Mother    Alcoholism Mother    Coronary artery disease Father        died age 64   Hypertension Father    Liver disease Father    Alcohol abuse Father    Obesity Father    Parkinson's disease Brother    Diabetes Brother    Hypertension Brother    Hypertension Maternal Uncle    Colon cancer Neg Hx    Rectal cancer Neg Hx    Stomach cancer Neg Hx    Colon polyps Neg Hx    Esophageal cancer Neg Hx     Social History   Socioeconomic History   Marital status: Married    Spouse name: [name]   Number of children: 2   Years of education: Not on file   Highest education level: Not on file  Occupational History   Occupation: retired    Fish farm manager: Charity fundraiser  Tobacco Use   Smoking status: Never   Smokeless tobacco: Never  Vaping Use   Vaping Use: Never used  Substance and Sexual Activity   Alcohol use: Not Currently   Drug use: No   Sexual activity: Not Currently    Partners: Male    Birth control/protection: Post-menopausal  Other Topics Concern   Not on file  Social History Narrative   hhof 2    2 Children at college and  beyond      [name]    Married    Retired age 13 2015   Recently moved taking care of grandchildren during the week.   Social Determinants of Health   Financial Resource Strain: Low Risk    Difficulty of Paying Living Expenses: Not hard at all  Food Insecurity: No Food Insecurity   Worried About Charity fundraiser in the Last Year: Never true   [place] in the Last Year: Never true  Transportation Needs: No Transportation Needs   Lack of Transportation  (Medical): No   Lack of Transportation (Non-Medical): No  Physical Activity: Sufficiently Active   Days of Exercise per Week: 6 days   Minutes of Exercise per Session: 30 min  Stress: No Stress Concern Present   Feeling of Stress : Not at all  Social Connections: Socially Integrated   Frequency of Communication with Friends and Family: More than three times a week   Frequency of Social Gatherings with Friends and Family: More than three times a week   Attends Religious Services: More than 4 times per year   Active Member of Clubs or Organizations: Yes   Attends Music therapist: More than 4 times per year   Marital Status: Married    Outpatient Medications Prior to Visit  Medication Sig Dispense Refill   aspirin EC 81 MG tablet Take 81 mg by mouth at bedtime.     atorvastatin (LIPITOR) 40 MG tablet TAKE 1 TABLET DAILY 90 tablet 3   augmented betamethasone dipropionate (DIPROLENE-AF) 0.05 % ointment USE AS DIRECTED. DO NOT USE MORE THAN 5 DAYS IN A ROW OR MORE THAN 7 DAYS EACH MONTH. 45 g 0   CALCIUM PO Take 1 tablet by mouth at bedtime.      chlorthalidone (HYGROTON) 25 MG tablet Take 1 tablet (25 mg total) by mouth daily. 90 tablet 3   diltiazem (CARDIZEM CD) 180 MG 24 hr capsule Take 1 capsule (180 mg total) by mouth daily. 90 capsule 3   diltiazem (CARDIZEM) 30 MG tablet 1 tablet every 6 hours for palpitations 60 tablet 3   estradiol (ESTRACE VAGINAL) 0.1 MG/GM vaginal cream INSERT 1 GRAM VAGINALLY TWICE WEEKLY 42.5 g 3   metoprolol succinate (TOPROL-XL) 50 MG 24 hr tablet Take 1 tablet (50 mg total) by mouth daily. 90 tablet 3   Multiple Vitamins-Minerals (PRESERVISION AREDS 2 PO) Take 1 tablet by mouth 2 (two) times daily.     naproxen sodium (ALEVE) 220 MG tablet Take 220 mg by mouth 2 (two) times daily as needed (for pain.).     nitrofurantoin, macrocrystal-monohydrate, (MACROBID) 100 MG capsule Take 1 capsule (100 mg total) by mouth daily. 90 capsule 3    sulfamethoxazole-trimethoprim (BACTRIM DS) 800-160 MG tablet Take 1 tablet by mouth 2 (two) times daily. 6 tablet 2   SYNTHROID 137 MCG tablet TAKE 1 TABLET DAILY (NEED TO SCHEDULE AN APPOINTMENT  FOR MORE REFILLS) 90 tablet 0   TRULICITY 3 IH/4.7QQ SOPN INJECT 3 MG ONCE A WEEK AS DIRECTED 6 mL 3   No facility-administered medications prior to visit.     EXAM:  BP 134/80 (BP Location: Left Arm, Patient Position: Sitting, Cuff Size: Large)   Pulse 71   Temp 97.8 F (36.6 C) (Oral)   Ht 4\' 11"  (1.499 m)   Wt 214 lb 6.4 oz (97.3 kg)   LMP 02/12/2011   SpO2 96%   BMI 43.30 kg/m   Body mass index is 43.3 kg/m.  GENERAL: vitals reviewed and listed above, alert, oriented, appears well hydrated and in no acute distress HEENT: atraumatic, conjunctiva  clear, no obvious abnormalities on inspection of external nose and ears OP : n masked NECK: no obvious masses on inspection palpation  LUNGS: clear to auscultation bilaterally, no wheezes, rales or rhonchi, good air movement Breast no nodules or discharge axilla is clear Abdomen no obvious organomegaly or masses noted. CV: HRRR, no clubbing cyanosis or  peripheral edema nl cap pulses are present. MS: moves all extremities without noticeable focal  abnormality PSYCH: pleasant and cooperative, no obvious depression or anxiety Lab Results  Component Value Date   WBC 8.1 06/27/2020   HGB 15.9 06/27/2020   HCT 47.9 (H) 06/27/2020   PLT 243 06/27/2020   GLUCOSE 83 06/25/2021   CHOL 117 01/02/2020   TRIG 57 01/02/2020   HDL 53 01/02/2020   LDLCALC 51 01/02/2020   ALT 27 06/25/2021   AST 24 06/25/2021   NA 141 06/25/2021   K 3.9 06/25/2021   CL 99 06/25/2021   CREATININE 0.56 (L) 06/25/2021   BUN 23 06/25/2021   CO2 28 06/25/2021   TSH 0.98 07/31/2020   INR 0.96 06/11/2011   HGBA1C 5.3 06/25/2021   BP Readings from Last 3 Encounters:  07/30/21 134/80  07/29/21 122/79  07/22/21 126/70    ASSESSMENT AND PLAN:  Discussed the  following assessment and plan:  Chronic idiopathic constipation - Plan: CBC with Differential/Platelet, Hepatic function panel, Lipid panel, TSH, TSH, Lipid panel, Hepatic function panel, CBC with Differential/Platelet  Thyroiditis, autoimmune  Essential hypertension - Plan: CBC with Differential/Platelet, Hepatic function panel, Lipid panel, TSH, TSH, Lipid panel, Hepatic function panel, CBC with Differential/Platelet  Pre-diabetes - Plan: CBC with Differential/Platelet, Hepatic function panel, Lipid panel, TSH, TSH, Lipid panel, Hepatic function panel, CBC with Differential/Platelet  Insulin resistance - Plan: CBC with Differential/Platelet, Hepatic function panel, Lipid panel, TSH, TSH, Lipid panel, Hepatic function panel, CBC with Differential/Platelet  Other specified hypothyroidism - Plan: CBC with Differential/Platelet, Hepatic function panel, Lipid panel, TSH, TSH, Lipid panel, Hepatic function panel, CBC with Differential/Platelet  Malignant carcinoid tumor of lung (HCC) - Plan: DG Chest 2 View, CBC with Differential/Platelet, Hepatic function panel, Lipid panel, TSH, TSH, Lipid panel, Hepatic function panel, CBC with Differential/Platelet  Paroxysmal atrial fibrillation (HCC) - Plan: CBC with Differential/Platelet, Hepatic function panel, Lipid panel, TSH, TSH, Lipid panel, Hepatic function panel, CBC with Differential/Platelet  Other hyperlipidemia - Plan: CBC with Differential/Platelet, Hepatic function panel, Lipid panel, TSH, TSH, Lipid panel, Hepatic function panel, CBC with Differential/Platelet  Class 3 severe obesity with serious comorbidity and body mass index (BMI) of 40.0 to 44.9 in adult, unspecified obesity type (HCC)  Obstructive sleep apnea  Medication management Consider Ozempic if covered by insurance Let us know if we need to help with prescriptions. Updated labs today we will send in refill of her thyroid medication if in range.  Chest x-ray ordered at  [facility] for constipation management which may also help her bladder function.  Should be safe to titrate with MiraLAX after relative gentle cleanout.  And let us know how doing.  6-12 mos dep results  -Patient advised to return or notify health care team  if  new concerns arise. Record r3eview plan counsel  rx 40 minutes  Patient Instructions  Miralax 1 capful per day  and can increase  per day until soft stools and titrate  Can clean out from below  dulcolax suppository for 1-2 days in a row  as needed.   Labs today  Chest x ray  ordered at [facility].   Will refill thyroid med if  in range   labs   [place] K. Parker Sawatzky M.D.

## 2021-07-30 NOTE — Patient Instructions (Addendum)
Miralax 1 capful per day  and can increase  per day until soft stools and titrate  Can clean out from below  dulcolax suppository for 1-2 days in a row  as needed.   Labs today  Chest x ray  ordered at [facility].   Will refill thyroid med if  in range   labs

## 2021-07-31 NOTE — Addendum Note (Signed)
Addended by: Megan Salon on: 07/31/2021 08:57 AM   Modules accepted: Level of Service

## 2021-08-04 ENCOUNTER — Other Ambulatory Visit: Payer: Self-pay | Admitting: Internal Medicine

## 2021-08-08 ENCOUNTER — Encounter (INDEPENDENT_AMBULATORY_CARE_PROVIDER_SITE_OTHER): Payer: Self-pay | Admitting: Family Medicine

## 2021-08-08 ENCOUNTER — Ambulatory Visit (INDEPENDENT_AMBULATORY_CARE_PROVIDER_SITE_OTHER): Payer: Medicare Other | Admitting: Family Medicine

## 2021-08-08 ENCOUNTER — Other Ambulatory Visit: Payer: Self-pay

## 2021-08-08 VITALS — BP 112/70 | HR 66 | Temp 98.2°F | Ht 59.0 in | Wt 211.0 lb

## 2021-08-08 DIAGNOSIS — Z6841 Body Mass Index (BMI) 40.0 and over, adult: Secondary | ICD-10-CM

## 2021-08-08 DIAGNOSIS — Z9189 Other specified personal risk factors, not elsewhere classified: Secondary | ICD-10-CM | POA: Diagnosis not present

## 2021-08-08 DIAGNOSIS — I10 Essential (primary) hypertension: Secondary | ICD-10-CM

## 2021-08-08 DIAGNOSIS — E8881 Metabolic syndrome: Secondary | ICD-10-CM | POA: Diagnosis not present

## 2021-08-08 MED ORDER — TIRZEPATIDE 10 MG/0.5ML ~~LOC~~ SOAJ
10.0000 mg | SUBCUTANEOUS | 0 refills | Status: DC
Start: 2021-08-08 — End: 2021-09-05

## 2021-08-08 NOTE — Progress Notes (Signed)
Forwarding  results to dr Radford Pax and Sabra Heck. LDL at goal

## 2021-08-08 NOTE — Progress Notes (Signed)
Chief Complaint:   OBESITY Nichole Smith is here to discuss her progress with her obesity treatment plan along with follow-up of her obesity related diagnoses. Nichole Smith is on keeping a food journal and adhering to recommended goals of 1500 calories and 95 grams of protein daily and states she is following her eating plan approximately 100% of the time. Nichole Smith states she is swimming, walking, and doing strength training for 40 minutes 7 times per week.  Today's visit was #: 41 Starting weight: 238 lbs Starting date: 01/12/2019 Today's weight: 211 lbs Today's date: 08/08/2021 Total lbs lost to date: 27 Total lbs lost since last in-office visit: 0  Interim History: Nichole Smith continues to work on diet and exercise. Her muscle mass appears to be improving which is an important positive. She is frustrated with how slow her weight loss is.  Subjective:   1. Insulin resistance Nichole Smith is on Trulicity, but she still struggles with polyphagia. She is open to looking at other medication options.  2. Essential hypertension Nichole Smith's blood pressure is well controlled today. She has no signs of hypotension.  3. At risk for diabetes mellitus Nichole Smith is at higher than average risk for developing diabetes due to obesity and insulin resistance.  Assessment/Plan:   1. Insulin resistance Nichole Smith agreed to discontinue Truicity, and start Mounjaro 10 mg q weekly with no refills. She will continue to work on weight loss, exercise, and decreasing simple carbohydrates to help decrease the risk of diabetes. Nichole Smith agreed to follow-up with [name] as directed to closely monitor her progress.  - tirzepatide (MOUNJARO) 10 MG/0.5ML Pen; Inject 10 mg into the skin once a week.  Dispense: 2 mL; Refill: 0  2. Essential hypertension Nichole Smith will continue with diet, exercise, and her medications to improve blood pressure control. She will watch for signs of hypotension as she continues her lifestyle modifications.  3. At risk for diabetes  mellitus Nichole Smith was given up to 15 minutes of insulin resistance and diabetes prevention education and counseling today. We discussed intensive lifestyle modifications today with an emphasis on weight loss as well as increasing exercise and decreasing simple carbohydrates in her diet. We also reviewed medication options with an emphasis on risk versus benefit of those discussed.   Repetitive spaced learning was employed today to elicit superior memory formation and behavioral change.  4. Obesity with current BMI 42.7 Nichole Smith is currently in the action stage of change. As such, her goal is to continue with weight loss efforts. She has agreed to keeping a food journal and adhering to recommended goals of 1500 calories and 95 grams of protein daily.   Exercise goals: As is.  Behavioral modification strategies: keeping a strict food journal.  Nichole Smith has agreed to follow-up with our clinic in 4 weeks. She was informed of the importance of frequent follow-up visits to maximize her success with intensive lifestyle modifications for her multiple health conditions.   Objective:   Blood pressure 112/70, pulse 66, temperature 98.2 F (36.8 C), height 4\' 11"  (1.499 m), weight 211 lb (95.7 kg), last menstrual period 02/12/2011, SpO2 96 %. Body mass index is 42.62 kg/m.  General: Cooperative, alert, well developed, in no acute distress. HEENT: Conjunctivae and lids unremarkable. Cardiovascular: Regular rhythm.  Lungs: Normal work of breathing. Neurologic: No focal deficits.   Lab Results  Component Value Date   CREATININE 0.56 (L) 06/25/2021   BUN 23 06/25/2021   NA 141 06/25/2021   K 3.9 06/25/2021   CL 99 06/25/2021  CO2 28 06/25/2021   Lab Results  Component Value Date   ALT 30 07/30/2021   AST 23 07/30/2021   ALKPHOS 56 07/30/2021   BILITOT 0.7 07/30/2021   Lab Results  Component Value Date   HGBA1C 5.3 06/25/2021   HGBA1C 5.4 06/27/2020   HGBA1C 5.5 01/02/2020   HGBA1C 5.6  08/22/2019   HGBA1C 5.9 (H) 01/12/2019   Lab Results  Component Value Date   INSULIN 12.2 06/25/2021   INSULIN 13.7 06/27/2020   INSULIN 13.5 01/02/2020   INSULIN 21.5 08/22/2019   INSULIN 20.4 01/12/2019   Lab Results  Component Value Date   TSH 2.26 07/30/2021   Lab Results  Component Value Date   CHOL 121 07/30/2021   HDL 52.50 07/30/2021   LDLCALC 59 07/30/2021   TRIG 49.0 07/30/2021   CHOLHDL 2 07/30/2021   Lab Results  Component Value Date   VD25OH 53.1 06/25/2021   VD25OH 51.9 06/27/2020   VD25OH 63.9 01/02/2020   Lab Results  Component Value Date   WBC 8.7 07/30/2021   HGB 15.3 (H) 07/30/2021   HCT 45.2 07/30/2021   MCV 93.9 07/30/2021   PLT 239.0 07/30/2021   No results found for: IRON, TIBC, FERRITIN  Attestation Statements:   Reviewed by clinician on day of visit: allergies, medications, problem list, medical history, surgical history, family history, social history, and previous encounter notes.   I, Trixie Dredge, am acting as transcriptionist for Dennard Nip, MD.  I have reviewed the above documentation for accuracy and completeness, and I agree with the above. -  Dennard Nip, MD

## 2021-08-08 NOTE — Progress Notes (Signed)
[facility] Summary Report / Loop Recorder 

## 2021-08-12 ENCOUNTER — Encounter: Payer: Self-pay | Admitting: Family Medicine

## 2021-08-12 ENCOUNTER — Encounter (INDEPENDENT_AMBULATORY_CARE_PROVIDER_SITE_OTHER): Payer: Self-pay

## 2021-08-12 ENCOUNTER — Other Ambulatory Visit: Payer: Self-pay

## 2021-08-12 ENCOUNTER — Encounter (INDEPENDENT_AMBULATORY_CARE_PROVIDER_SITE_OTHER): Payer: Self-pay | Admitting: Family Medicine

## 2021-08-12 DIAGNOSIS — M5416 Radiculopathy, lumbar region: Secondary | ICD-10-CM

## 2021-08-12 NOTE — Telephone Encounter (Signed)
Patient was not sent savings card, due to patient having government insurance (Medicare, Tricare, [place], etc), patient is not eligible for [facility] savings card when they are approved or denied. I advised April this morning to please notify clinical staff about this new information to pass along to providers. Patients will receive my standard mychart message that there were denied and to discuss with provider at next office visit. If they are approved with those insurances listed above, they are still not able to use the [facility] saving card and I send them my standard mychart approval message.

## 2021-08-12 NOTE — Telephone Encounter (Signed)
Can you please send her the coupon info? Thanks!

## 2021-08-12 NOTE — Telephone Encounter (Signed)
Please see message and advise 

## 2021-08-13 NOTE — Telephone Encounter (Signed)
Please see message and advise 

## 2021-08-19 NOTE — Telephone Encounter (Signed)
Please see message and advise.  Thank you. ° °

## 2021-08-28 ENCOUNTER — Encounter (HOSPITAL_BASED_OUTPATIENT_CLINIC_OR_DEPARTMENT_OTHER): Payer: Self-pay | Admitting: Obstetrics & Gynecology

## 2021-08-29 ENCOUNTER — Encounter (HOSPITAL_BASED_OUTPATIENT_CLINIC_OR_DEPARTMENT_OTHER): Payer: Self-pay | Admitting: Obstetrics & Gynecology

## 2021-08-30 ENCOUNTER — Ambulatory Visit
Admission: RE | Admit: 2021-08-30 | Discharge: 2021-08-30 | Disposition: A | Discharge status: Home / Self Care | Payer: Medicare Other | Source: Ambulatory Visit | Attending: Family Medicine | Admitting: Family Medicine

## 2021-08-30 DIAGNOSIS — M5416 Radiculopathy, lumbar region: Secondary | ICD-10-CM

## 2021-08-30 MED ORDER — IOPAMIDOL (ISOVUE-M 200) INJECTION 41%
1.0000 mL | Freq: Once | INTRAMUSCULAR | Status: AC
  Administered 2021-08-30: 1 mL via EPIDURAL

## 2021-08-30 MED ORDER — METHYLPREDNISOLONE ACETATE 40 MG/ML INJ SUSP (RADIOLOG
80.0000 mg | Freq: Once | INTRAMUSCULAR | Status: AC
  Administered 2021-08-30: 80 mg via EPIDURAL

## 2021-08-30 NOTE — Discharge Instructions (Signed)

## 2021-09-02 ENCOUNTER — Ambulatory Visit (INDEPENDENT_AMBULATORY_CARE_PROVIDER_SITE_OTHER): Payer: Medicare Other

## 2021-09-02 ENCOUNTER — Telehealth (HOSPITAL_BASED_OUTPATIENT_CLINIC_OR_DEPARTMENT_OTHER): Payer: Self-pay | Admitting: Obstetrics & Gynecology

## 2021-09-02 DIAGNOSIS — I480 Paroxysmal atrial fibrillation: Secondary | ICD-10-CM | POA: Diagnosis not present

## 2021-09-02 LAB — CUP PACEART REMOTE DEVICE CHECK
Date Time Interrogation Session: 20220927135325
Implantable Pulse Generator Implant Date: 20220620

## 2021-09-02 NOTE — Telephone Encounter (Signed)
Called patient and left message to call the office to scheduled ethe ultrasound  for November .

## 2021-09-05 ENCOUNTER — Encounter (INDEPENDENT_AMBULATORY_CARE_PROVIDER_SITE_OTHER): Payer: Self-pay | Admitting: Family Medicine

## 2021-09-05 ENCOUNTER — Other Ambulatory Visit: Payer: Self-pay

## 2021-09-05 ENCOUNTER — Ambulatory Visit (INDEPENDENT_AMBULATORY_CARE_PROVIDER_SITE_OTHER): Payer: Medicare Other | Admitting: Family Medicine

## 2021-09-05 VITALS — BP 124/78 | HR 72 | Temp 98.1°F | Ht 59.0 in | Wt 210.0 lb

## 2021-09-05 DIAGNOSIS — Z6841 Body Mass Index (BMI) 40.0 and over, adult: Secondary | ICD-10-CM

## 2021-09-05 DIAGNOSIS — R7303 Prediabetes: Secondary | ICD-10-CM | POA: Diagnosis not present

## 2021-09-05 MED ORDER — TRULICITY 3 MG/0.5ML ~~LOC~~ SOAJ: 3.0000 mg | SUBCUTANEOUS | 0 refills | Status: DC

## 2021-09-09 NOTE — Progress Notes (Signed)
[facility] Summary Report / Loop Recorder 

## 2021-09-09 NOTE — Progress Notes (Signed)
Chief Complaint:   OBESITY Nichole Smith is here to discuss her progress with her obesity treatment plan along with follow-up of her obesity related diagnoses. Nichole Smith is on keeping a food journal and adhering to recommended goals of 1500 calories and 95 grams of protein daily and states she is following her eating plan approximately 75-80% of the time. Nichole Smith states she is walking, swimming, and strengthening for 30 minutes 7 times per week.  Today's visit was #: 6 Starting weight: 238 lbs Starting date: 01/12/2019 Today's weight: 210 lbs Today's date: 09/05/2021 Total lbs lost to date: 28 Total lbs lost since last in-office visit: 1  Interim History: Nichole Smith continues to work on weight loss. She is frustrated by her slow progress and she has questions about weight loss surgery.  Subjective:   1. Pre-diabetes Nichole Smith was changed to Nichole Smith at her last visit, but her insurance refused to cover it so she changed back to Trulicity at 1.5 mg.  Assessment/Plan:   1. Pre-diabetes Nichole Smith will continue to work on weight loss, exercise, and decreasing simple carbohydrates to help decrease the risk of diabetes. We will refill Trulicity at 3.0 mg for 90 days with no refills.  - Dulaglutide (TRULICITY) 3 UX/3.2TF SOPN; Inject 3 mg as directed once a week.  Dispense: 6 mL; Refill: 0  2. Obesity with current BMI 42.6 Nichole Smith is currently in the action stage of change. As such, her goal is to continue with weight loss efforts. She has agreed to keeping a food journal and adhering to recommended goals of 1500 calories and 95 grams of protein daily.   We discussed various procedures and eligibility and insurance coverage for weight loss. Nichole Smith was advised to look at the [facility] online information session for updated information.  We will recheck her fasting IC at her next visit.  Exercise goals: As is.  Behavioral modification strategies: keeping a strict food journal.  Nichole Smith has agreed  to follow-up with our clinic in 4 weeks. She was informed of the importance of frequent follow-up visits to maximize her success with intensive lifestyle modifications for her multiple health conditions.   Objective:   Blood pressure 124/78, pulse 72, temperature 98.1 F (36.7 C), height 4\' 11"  (1.499 m), weight 210 lb (95.3 kg), last menstrual period 02/12/2011, SpO2 96 %. Body mass index is 42.41 kg/m.  General: Cooperative, alert, well developed, in no acute distress. HEENT: Conjunctivae and lids unremarkable. Cardiovascular: Regular rhythm.  Lungs: Normal work of breathing. Neurologic: No focal deficits.   Lab Results  Component Value Date   CREATININE 0.56 (L) 06/25/2021   BUN 23 06/25/2021   NA 141 06/25/2021   K 3.9 06/25/2021   CL 99 06/25/2021   CO2 28 06/25/2021   Lab Results  Component Value Date   ALT 30 07/30/2021   AST 23 07/30/2021   ALKPHOS 56 07/30/2021   BILITOT 0.7 07/30/2021   Lab Results  Component Value Date   HGBA1C 5.3 06/25/2021   HGBA1C 5.4 06/27/2020   HGBA1C 5.5 01/02/2020   HGBA1C 5.6 08/22/2019   HGBA1C 5.9 (H) 01/12/2019   Lab Results  Component Value Date   INSULIN 12.2 06/25/2021   INSULIN 13.7 06/27/2020   INSULIN 13.5 01/02/2020   INSULIN 21.5 08/22/2019   INSULIN 20.4 01/12/2019   Lab Results  Component Value Date   TSH 2.26 07/30/2021   Lab Results  Component Value Date   CHOL 121 07/30/2021   HDL 52.50 07/30/2021  LDLCALC 59 07/30/2021   TRIG 49.0 07/30/2021   CHOLHDL 2 07/30/2021   Lab Results  Component Value Date   VD25OH 53.1 06/25/2021   VD25OH 51.9 06/27/2020   VD25OH 63.9 01/02/2020   Lab Results  Component Value Date   WBC 8.7 07/30/2021   HGB 15.3 (H) 07/30/2021   HCT 45.2 07/30/2021   MCV 93.9 07/30/2021   PLT 239.0 07/30/2021   No results found for: IRON, TIBC, FERRITIN  Obesity Behavioral Intervention:   Approximately 15 minutes were spent on the discussion below.  ASK: We discussed the  diagnosis of obesity with Nichole Smith today and Nichole Smith agreed to give [name] permission to discuss obesity behavioral modification therapy today.  ASSESS: Nichole Smith has the diagnosis of obesity and her BMI today is 42.6. Jaylean is in the action stage of change.   ADVISE: Nichole Smith was educated on the multiple health risks of obesity as well as the benefit of weight loss to improve her health. She was advised of the need for long term treatment and the importance of lifestyle modifications to improve her current health and to decrease her risk of future health problems.  AGREE: Multiple dietary modification options and treatment options were discussed and Nichole Smith agreed to follow the recommendations documented in the above note.  ARRANGE: Nichole Smith was educated on the importance of frequent visits to treat obesity as outlined per CMS and USPSTF guidelines and agreed to schedule her next follow up appointment today.  Attestation Statements:   Reviewed by clinician on day of visit: allergies, medications, problem list, medical history, surgical history, family history, social history, and previous encounter notes.   I, Trixie Dredge, am acting as transcriptionist for Dennard Nip, MD.  I have reviewed the above documentation for accuracy and completeness, and I agree with the above. -  Dennard Nip, MD

## 2021-09-11 ENCOUNTER — Ambulatory Visit: Payer: Medicare Other | Admitting: Family Medicine

## 2021-09-30 NOTE — Progress Notes (Signed)
Zach Karlo Goeden [place] [address] [place] Phone: [phone] Subjective:   [name], am serving as a scribe for Dr. Hulan Saas. This visit occurred during the SARS-CoV-2 public health emergency.  Safety protocols were in place, including screening questions prior to the visit, additional usage of staff PPE, and extensive cleaning of exam room while observing appropriate contact time as indicated for disinfecting solutions.   I'm seeing this patient by the request  of:  Panosh, Standley Brooking, MD  CC: Low back pain follow-up  AGT:[id number]  07/15/2021 Epidural 7/27 Lumbar radiculopathy from spinal stenosis  Much approved since injected  HEP  Encouraged weight loss  RTC in 8 weeks   Injected  Discussed HEP  Discussed which activities to do and do think some lumbar radiculopathy  RTC in 2 months   Updated 10/01/2021 HANAE WAITERS is a 65 y.o. female coming in with complaint of back pain. Had Epi on 08/30/2021. Injection is going well. Starting to wear off. Next step options, possibly with someone else. Osteoporosis diagnosis. Exercise progression recommendation. What happened to the tears? Patient did have imaging that did show that patient has severe spinal stenosis at L3-L4.  He now has had 2 injections with the epidural since July.       Past Medical History:  Diagnosis Date   Arthritis    knees, back   Bursitis    hips - almost gone per pt    Cancer ([place])    Carcinoid tumor of lung 2012   right found incidentally on chest xray eval for r/o vasxculitis    Carpal tunnel syndrome, bilateral    bilat but surgery to correct    Coronary artery calcification 07/23/2016   Double vision    history, no current problem   Dysrhythmia    irregular occassional, no current problem   Frequent UTI    HNP (herniated nucleus pulposus), cervical 01/22/2011   C6-C7  Has seen dr Vertell Limber for this and Dr Tonita Cong  Positional numbness in hands no weakness      Hypertension    Hypothyroidism    Infertility, female    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  Download 08/2014:  Good compliance, breakthru apnea on 10cm.  Changed to auto 5-12cm >> good control of AHI on f/u download ONO on CPAP:       Osteoarthritis    PCOS (polycystic ovarian syndrome)    Sleep apnea    wears cpap    SUI (stress urinary incontinence, female)    h/o   Past Surgical History:  Procedure Laterality Date   ANTERIOR CERVICAL DECOMP/DISCECTOMY FUSION  12/24/2017   Procedure: Cervical five-six Cervical six-seven Anterior cervical decompression/discectomy/fusion;  Surgeon: Erline Levine, MD;  Location: [facility];  Service: Neurosurgery;;   CARPAL TUNNEL RELEASE Left 12/24/2017   Procedure: LEFT CARPAL TUNNEL RELEASE;  Surgeon: Erline Levine, MD;  Location: [facility];  Service: Neurosurgery;  Laterality: Left;   CESAREAN SECTION     x 1 - twins   COLONOSCOPY     DILATATION & CURRETTAGE/HYSTEROSCOPY WITH RESECTOCOPE N/A 07/18/2013   Procedure: [place];  Surgeon: Lyman Speller, MD;  Location: [facility];  Service: Gynecology;  Laterality: N/A;  mass resection   implantable loop recorder placement  05/20/2021   Medtronic Reveal Linq model [id number] implantable loop recorder (SN [id number])   Lung tumor removed  Rt lung carcinoid   MOUTH SURGERY     pre-cancerous ulcer removed   POLYPECTOMY     SHOULDER OPEN ROTATOR CUFF REPAIR  07/20/2010   Right   WISDOM TOOTH EXTRACTION     Social History   Socioeconomic History   Marital status: Married    Spouse name: [name]   Number of children: 2   Years of education: Not on file   Highest education level: Not on file  Occupational History   Occupation: retired    Fish farm manager: Charity fundraiser  Tobacco Use   Smoking status: Never   Smokeless tobacco: Never  Vaping Use   Vaping  Use: Never used  Substance and Sexual Activity   Alcohol use: Not Currently   Drug use: No   Sexual activity: Not Currently    Partners: Male    Birth control/protection: Post-menopausal  Other Topics Concern   Not on file  Social History Narrative   hhof 2    2 Children at college and  beyond      [name]    Married    Retired age 89 2015   Recently moved taking care of grandchildren during the week.   Social Determinants of Health   Financial Resource Strain: Low Risk    Difficulty of Paying Living Expenses: Not hard at all  Food Insecurity: No Food Insecurity   Worried About Charity fundraiser in the Last Year: Never true   [place] in the Last Year: Never true  Transportation Needs: No Transportation Needs   Lack of Transportation (Medical): No   Lack of Transportation (Non-Medical): No  Physical Activity: Sufficiently Active   Days of Exercise per Week: 6 days   Minutes of Exercise per Session: 30 min  Stress: No Stress Concern Present   Feeling of Stress : Not at all  Social Connections: Socially Integrated   Frequency of Communication with Friends and Family: More than three times a week   Frequency of Social Gatherings with Friends and Family: More than three times a week   Attends Religious Services: More than 4 times per year   Active Member of [employer] or Organizations: Yes   Attends Music therapist: More than 4 times per year   Marital Status: Married   Allergies  Allergen Reactions   Neomycin Other (See Comments)    Inflammation--eye drop    Ace Inhibitors Cough   Family History  Problem Relation Age of Onset   Stroke Mother        died age 69   Hypertension Mother    Sudden death Mother    Alcoholism Mother    Coronary artery disease Father        died age 26   Hypertension Father    Liver disease Father    Alcohol abuse Father    Obesity Father    Parkinson's disease Brother    Diabetes Brother    Hypertension  Brother    Hypertension Maternal Uncle    Colon cancer Neg Hx    Rectal cancer Neg Hx    Stomach cancer Neg Hx    Colon polyps Neg Hx    Esophageal cancer Neg Hx     Current Outpatient Medications (Endocrine & Metabolic):    Dulaglutide (TRULICITY) 3 BL/3.9QZ SOPN, Inject 3 mg as directed once a week.   SYNTHROID 137 MCG tablet, TAKE 1 TABLET DAILY (NEED TO SCHEDULE AN APPOINTMENT FOR MORE REFILLS)  Current Outpatient  Medications (Cardiovascular):    atorvastatin (LIPITOR) 40 MG tablet, TAKE 1 TABLET DAILY   chlorthalidone (HYGROTON) 25 MG tablet, Take 1 tablet (25 mg total) by mouth daily.   diltiazem (CARDIZEM CD) 180 MG 24 hr capsule, Take 1 capsule (180 mg total) by mouth daily.   diltiazem (CARDIZEM) 30 MG tablet, 1 tablet every 6 hours for palpitations   metoprolol succinate (TOPROL-XL) 50 MG 24 hr tablet, Take 1 tablet (50 mg total) by mouth daily.   Current Outpatient Medications (Analgesics):    aspirin EC 81 MG tablet, Take 81 mg by mouth at bedtime.   naproxen sodium (ALEVE) 220 MG tablet, Take 220 mg by mouth 2 (two) times daily as needed (for pain.).   Current Outpatient Medications (Other):    augmented betamethasone dipropionate (DIPROLENE-AF) 0.05 % ointment, USE AS DIRECTED. DO NOT USE MORE THAN 5 DAYS IN A ROW OR MORE THAN 7 DAYS EACH MONTH.   CALCIUM PO, Take 1 tablet by mouth at bedtime.    estradiol (ESTRACE VAGINAL) 0.1 MG/GM vaginal cream, INSERT 1 GRAM VAGINALLY TWICE WEEKLY   Multiple Vitamins-Minerals (PRESERVISION AREDS 2 PO), Take 1 tablet by mouth 2 (two) times daily.   nitrofurantoin, macrocrystal-monohydrate, (MACROBID) 100 MG capsule, Take 1 capsule (100 mg total) by mouth daily.   sulfamethoxazole-trimethoprim (BACTRIM DS) 800-160 MG tablet, Take 1 tablet by mouth 2 (two) times daily.   Review of Systems:  No headache, visual changes, nausea, vomiting, diarrhea, constipation, dizziness, abdominal pain, skin rash, fevers, chills, night sweats,  weight loss, swollen lymph nodes, body aches, joint swelling, chest pain, shortness of breath, mood changes. POSITIVE muscle aches  Objective  Blood pressure 130/80, pulse 75, height 4\' 11"  (1.499 m), weight 215 lb (97.5 kg), last menstrual period 02/12/2011, SpO2 94 %.   General: No apparent distress alert and oriented x3 mood and affect normal, dressed appropriately.  Obese HEENT: Pupils equal, extraocular movements intact  Respiratory: Patient's speak in full sentences and does not appear short of breath  Cardiovascular: No lower extremity edema, non tender, no erythema  Gait normal with good balance and coordination.  MSK: Low back exam does have some loss of lordosis.  Some tenderness to palpation in the paraspinal musculature. Comfortable sitting no in the chair at the moment.   Impression and Recommendations:     The above documentation has been reviewed and is accurate and complete Lyndal Pulley, DO

## 2021-10-01 ENCOUNTER — Encounter: Payer: Self-pay | Admitting: Family Medicine

## 2021-10-01 ENCOUNTER — Other Ambulatory Visit: Payer: Self-pay

## 2021-10-01 ENCOUNTER — Ambulatory Visit (INDEPENDENT_AMBULATORY_CARE_PROVIDER_SITE_OTHER): Payer: Medicare Other | Admitting: Family Medicine

## 2021-10-01 DIAGNOSIS — I2584 Coronary atherosclerosis due to calcified coronary lesion: Secondary | ICD-10-CM | POA: Diagnosis not present

## 2021-10-01 DIAGNOSIS — M5416 Radiculopathy, lumbar region: Secondary | ICD-10-CM

## 2021-10-01 DIAGNOSIS — I2510 Atherosclerotic heart disease of native coronary artery without angina pectoris: Secondary | ICD-10-CM

## 2021-10-01 DIAGNOSIS — M7601 Gluteal tendinitis, right hip: Secondary | ICD-10-CM

## 2021-10-01 LAB — CUP PACEART REMOTE DEVICE CHECK
Date Time Interrogation Session: 20221030135051
Implantable Pulse Generator Implant Date: 20220620

## 2021-10-01 NOTE — Assessment & Plan Note (Addendum)
Patient has known severe L3-L4 spinal stenosis.  Has now had 2 epidurals in the back in the last 4 months.  Patient states this epidural has been much more beneficial at the moment.  Having significant decrease in pain.  Patient would like to know what other steps may be necessary in the future.  Will refer patient to neurosurgery to discuss but likely at this point I do think patient will do well with conservative therapy.  Follow-up with me again in 2 to 3 months.

## 2021-10-01 NOTE — Assessment & Plan Note (Signed)
Encourage patient to continue to work on weight loss.

## 2021-10-01 NOTE — Patient Instructions (Signed)
Good to see you  We will refer you to discuss with [facility] just to know your options Still have room for another injection if needed down the road.  PT at [facility] and they will call you  See me again in 8-12 weeks

## 2021-10-01 NOTE — Assessment & Plan Note (Signed)
Patient continues to have discomfort and pain in this area.  MRI did show the patient did have significant amount of partial tearing noted of multiple different gluteal muscles.  I do think that this is secondary to more of the denervation that was occurring from the spinal stenosis.  May need to consider the possibility of PRP injection in this area.  We will continue to monitor and follow-up again in 2 to 3 months.  Get patient into formal physical therapy for this as well as the spinal stenosis of the lumbar spine.

## 2021-10-02 ENCOUNTER — Ambulatory Visit (INDEPENDENT_AMBULATORY_CARE_PROVIDER_SITE_OTHER): Payer: Medicare Other | Admitting: Obstetrics & Gynecology

## 2021-10-02 ENCOUNTER — Encounter (HOSPITAL_BASED_OUTPATIENT_CLINIC_OR_DEPARTMENT_OTHER): Payer: Self-pay | Admitting: Obstetrics & Gynecology

## 2021-10-02 VITALS — BP 135/72 | HR 75 | Ht 59.0 in | Wt 218.6 lb

## 2021-10-02 DIAGNOSIS — M810 Age-related osteoporosis without current pathological fracture: Secondary | ICD-10-CM

## 2021-10-02 DIAGNOSIS — I2584 Coronary atherosclerosis due to calcified coronary lesion: Secondary | ICD-10-CM

## 2021-10-02 DIAGNOSIS — I2510 Atherosclerotic heart disease of native coronary artery without angina pectoris: Secondary | ICD-10-CM

## 2021-10-03 ENCOUNTER — Ambulatory Visit (INDEPENDENT_AMBULATORY_CARE_PROVIDER_SITE_OTHER): Payer: Medicare Other | Admitting: Family Medicine

## 2021-10-03 ENCOUNTER — Other Ambulatory Visit: Payer: Self-pay

## 2021-10-03 ENCOUNTER — Encounter (INDEPENDENT_AMBULATORY_CARE_PROVIDER_SITE_OTHER): Payer: Self-pay | Admitting: Family Medicine

## 2021-10-03 VITALS — BP 137/75 | HR 61 | Temp 97.9°F | Ht 59.0 in | Wt 213.0 lb

## 2021-10-03 DIAGNOSIS — R7303 Prediabetes: Secondary | ICD-10-CM

## 2021-10-03 DIAGNOSIS — Z6841 Body Mass Index (BMI) 40.0 and over, adult: Secondary | ICD-10-CM

## 2021-10-03 DIAGNOSIS — E7849 Other hyperlipidemia: Secondary | ICD-10-CM | POA: Diagnosis not present

## 2021-10-03 DIAGNOSIS — E559 Vitamin D deficiency, unspecified: Secondary | ICD-10-CM

## 2021-10-03 DIAGNOSIS — R0602 Shortness of breath: Secondary | ICD-10-CM | POA: Diagnosis not present

## 2021-10-03 DIAGNOSIS — E038 Other specified hypothyroidism: Secondary | ICD-10-CM

## 2021-10-03 MED ORDER — BD PEN NEEDLE NANO 2ND GEN 32G X 4 MM MISC: 1.0000 | Freq: Two times a day (BID) | 0 refills | Status: DC

## 2021-10-03 MED ORDER — LIRAGLUTIDE 18 MG/3ML ~~LOC~~ SOPN: 1.8000 mg | PEN_INJECTOR | Freq: Every morning | SUBCUTANEOUS | 0 refills | Status: DC

## 2021-10-03 NOTE — Progress Notes (Signed)
Chief Complaint:   OBESITY Nichole Smith is here to discuss her progress with her obesity treatment plan along with follow-up of her obesity related diagnoses. Nichole Smith is on keeping a food journal and adhering to recommended goals of 1500 calories and 95 grams of protein daily and states she is following her eating plan approximately 50% of the time. Nichole Smith states she is walking and swimming for 30 minutes 7 times per week.  Today's visit was #: 24 Starting weight: 238 lbs Starting date: 01/12/2019 Today's weight: 213 lbs Today's date: 10/03/2021 Total lbs lost to date: 25 Total lbs lost since last in-office visit: 0  Interim History: Nichole Smith continues to struggle with weight loss. She is not journaling everyday, but she has tried to be mindful. She is frustrated with her weight gain. She is considering weight loss surgery, but she is not very excited about this options.  Subjective:   1. Shortness of breath on exertion Nichole Smith's shortness of breath is unchanged and she is trying to exercise most days.  2. Other hyperlipidemia Nichole Smith is working on diet and exercise, and she is due for labs.  3. Pre-diabetes Nichole Smith is on Trulicity, but she is still struggling with polyphagia. Due to her insurance refusing to cover, we cannot prescribe Mounjaro.  4. Vitamin D deficiency Nichole Smith is on Vit D. Dr. Sabra Heck requests a PTH to be done due to a new diagnosis of osteoporosis.  5. Other specified hypothyroidism Nichole Smith is on Synthroid, and she still notes some fatigue.  Assessment/Plan:   1. Shortness of breath on exertion Nichole Smith's repeat IC shows her RMR has decreased significantly which is unusual due to her level of exercise. We will modify her eating plan and exercise to help adjust this.  2. Other hyperlipidemia Cardiovascular risk and specific lipid/LDL goals reviewed. We discussed several lifestyle modifications today. We will check labs today. Nichole Smith will continue to work on diet, exercise and weight  loss efforts. Orders and follow up as documented in patient record.   - Lipid Panel With LDL/HDL Ratio  3. Pre-diabetes Nichole Smith agreed to start Victoza 1.8 mg q AM with food, with a 90 day supply. She will continue Trulicity while we wait on the prior authorization for Victoza. She will continue to work on weight loss, exercise, and decreasing simple carbohydrates to help decrease the risk of diabetes.   - CMP14+EGFR - Insulin, random - Hemoglobin A1c  4. Vitamin D deficiency Low Vitamin D level contributes to fatigue and are associated with obesity, breast, and colon cancer. We will check labs today, and Nichole Smith will follow-up for routine testing of Vitamin D, at least 2-3 times per year to avoid over-replacement.  - VITAMIN D 25 Hydroxy (Vit-D Deficiency, Fractures) - Parathyroid hormone, intact (no Ca)  5. Other specified hypothyroidism We will check labs today. Orders and follow up as documented in patient record.  - TSH - T4, free - T3  6. Obesity with current BMI of 43.1 Nichole Smith is currently in the action stage of change. As such, her goal is to continue with weight loss efforts. She has agreed to keeping a food journal and adhering to recommended goals of 1200-1500 calories and 85+ grams of protein daily.   Exercise goals: As is, add strengthening exercise with physical therapy.  Behavioral modification strategies: increasing lean protein intake and meal planning and cooking strategies.  Nichole Smith has agreed to follow-up with our clinic in 4 weeks. She was informed of the importance of frequent follow-up visits to  maximize her success with intensive lifestyle modifications for her multiple health conditions.   Nichole Smith was informed we would discuss her lab results at her next visit unless there is a critical issue that needs to be addressed sooner. Nichole Smith agreed to keep her next visit at the agreed upon time to discuss these results.  Objective:   Blood pressure 137/75, pulse 61,  temperature 97.9 F (36.6 C), height _0  (1.499 m), weight 213 lb (96.6 kg), last menstrual period 02/12/2011, SpO2 99 %. Body mass index is 43.02 kg/m.  General: Cooperative, alert, well developed, in no acute distress. HEENT: Conjunctivae and lids unremarkable. Cardiovascular: Regular rhythm.  Lungs: Normal work of breathing. Neurologic: No focal deficits.   Lab Results  Component Value Date   CREATININE 0.56 (L) 06/25/2021   BUN 23 06/25/2021   NA 141 06/25/2021   K 3.9 06/25/2021   CL 99 06/25/2021   CO2 28 06/25/2021   Lab Results  Component Value Date   ALT 30 07/30/2021   AST 23 07/30/2021   ALKPHOS 56 07/30/2021   BILITOT 0.7 07/30/2021   Lab Results  Component Value Date   HGBA1C 5.3 06/25/2021   HGBA1C 5.4 06/27/2020   HGBA1C 5.5 01/02/2020   HGBA1C 5.6 08/22/2019   HGBA1C 5.9 (H) 01/12/2019   Lab Results  Component Value Date   INSULIN 12.2 06/25/2021   INSULIN 13.7 06/27/2020   INSULIN 13.5 01/02/2020   INSULIN 21.5 08/22/2019   INSULIN 20.4 01/12/2019   Lab Results  Component Value Date   TSH 2.26 07/30/2021   Lab Results  Component Value Date   CHOL 121 07/30/2021   HDL 52.50 07/30/2021   LDLCALC 59 07/30/2021   TRIG 49.0 07/30/2021   CHOLHDL 2 07/30/2021   Lab Results  Component Value Date   VD25OH 53.1 06/25/2021   VD25OH 51.9 06/27/2020   VD25OH 63.9 01/02/2020   Lab Results  Component Value Date   WBC 8.7 07/30/2021   HGB 15.3 (H) 07/30/2021   HCT 45.2 07/30/2021   MCV 93.9 07/30/2021   PLT 239.0 07/30/2021   No results found for: IRON, TIBC, FERRITIN  Obesity Behavioral Intervention:   Approximately 15 minutes were spent on the discussion below.  ASK: We discussed the diagnosis of obesity with Nichole Smith today and Nichole Smith agreed to give [name] permission to discuss obesity behavioral modification therapy today.  ASSESS: Nichole Smith has the diagnosis of obesity and her BMI today is 43.1. Nichole Smith is in the action stage of change.    ADVISE: Nichole Smith was educated on the multiple health risks of obesity as well as the benefit of weight loss to improve her health. She was advised of the need for long term treatment and the importance of lifestyle modifications to improve her current health and to decrease her risk of future health problems.  AGREE: Multiple dietary modification options and treatment options were discussed and Jacalyn agreed to follow the recommendations documented in the above note.  ARRANGE: Tarae was educated on the importance of frequent visits to treat obesity as outlined per CMS and USPSTF guidelines and agreed to schedule her next follow up appointment today.  Attestation Statements:   Reviewed by clinician on day of visit: allergies, medications, problem list, medical history, surgical history, family history, social history, and previous encounter notes.   I, Trixie Dredge, am acting as transcriptionist for Dennard Nip, MD.  I have reviewed the above documentation for accuracy and completeness, and I agree with the above. -  Gillian Meeuwsen  Leafy Ro, MD

## 2021-10-04 LAB — CMP14+EGFR
ALT: 32 IU/L (ref 0–32)
AST: 26 IU/L (ref 0–40)
Albumin/Globulin Ratio: 1.4 (ref 1.2–2.2)
Albumin: 4.1 g/dL (ref 3.8–4.8)
Alkaline Phosphatase: 73 IU/L (ref 44–121)
BUN/Creatinine Ratio: 37 — ABNORMAL HIGH (ref 12–28)
BUN: 25 mg/dL (ref 8–27)
Bilirubin Total: 0.5 mg/dL (ref 0.0–1.2)
CO2: 28 mmol/L (ref 20–29)
Calcium: 9.6 mg/dL (ref 8.7–10.3)
Chloride: 100 mmol/L (ref 96–106)
Creatinine, Ser: 0.67 mg/dL (ref 0.57–1.00)
Globulin, Total: 2.9 g/dL (ref 1.5–4.5)
Glucose: 84 mg/dL (ref 70–99)
Potassium: 4 mmol/L (ref 3.5–5.2)
Sodium: 141 mmol/L (ref 134–144)
Total Protein: 7 g/dL (ref 6.0–8.5)
eGFR: 96 mL/min/{1.73_m2} (ref >59)

## 2021-10-04 LAB — T3: T3, Total: 120 ng/dL (ref 71–180)

## 2021-10-04 LAB — T4, FREE: Free T4: 1.54 ng/dL (ref 0.82–1.77)

## 2021-10-04 LAB — PARATHYROID HORMONE, INTACT (NO CA): PTH: 39 pg/mL (ref 15–65)

## 2021-10-04 LAB — VITAMIN D 25 HYDROXY (VIT D DEFICIENCY, FRACTURES): Vit D, 25-Hydroxy: 62.5 ng/mL (ref 30.0–100.0)

## 2021-10-04 LAB — LIPID PANEL WITH LDL/HDL RATIO
Cholesterol, Total: 148 mg/dL (ref 100–199)
HDL: 58 mg/dL (ref >39)
LDL Chol Calc (NIH): 76 mg/dL (ref 0–99)
LDL/HDL Ratio: 1.3 ratio (ref 0.0–3.2)
Triglycerides: 71 mg/dL (ref 0–149)
VLDL Cholesterol Cal: 14 mg/dL (ref 5–40)

## 2021-10-04 LAB — HEMOGLOBIN A1C
Est. average glucose Bld gHb Est-mCnc: 111 mg/dL
Hgb A1c MFr Bld: 5.5 % (ref 4.8–5.6)

## 2021-10-04 LAB — TSH: TSH: 3.07 u[IU]/mL (ref 0.450–4.500)

## 2021-10-04 LAB — INSULIN, RANDOM: INSULIN: 13.6 u[IU]/mL (ref 2.6–24.9)

## 2021-10-06 ENCOUNTER — Encounter (INDEPENDENT_AMBULATORY_CARE_PROVIDER_SITE_OTHER): Payer: Self-pay | Admitting: Family Medicine

## 2021-10-07 ENCOUNTER — Ambulatory Visit (INDEPENDENT_AMBULATORY_CARE_PROVIDER_SITE_OTHER): Payer: Medicare Other

## 2021-10-07 ENCOUNTER — Encounter (INDEPENDENT_AMBULATORY_CARE_PROVIDER_SITE_OTHER): Payer: Self-pay

## 2021-10-07 DIAGNOSIS — I480 Paroxysmal atrial fibrillation: Secondary | ICD-10-CM | POA: Diagnosis not present

## 2021-10-07 NOTE — Telephone Encounter (Signed)
Last OV with Dr. Beasley 

## 2021-10-09 ENCOUNTER — Encounter (HOSPITAL_BASED_OUTPATIENT_CLINIC_OR_DEPARTMENT_OTHER): Payer: Self-pay | Admitting: Obstetrics & Gynecology

## 2021-10-09 ENCOUNTER — Encounter (HOSPITAL_BASED_OUTPATIENT_CLINIC_OR_DEPARTMENT_OTHER): Payer: Self-pay

## 2021-10-09 NOTE — Progress Notes (Signed)
[facility] Summary Report / Loop Recorder 

## 2021-10-09 NOTE — Telephone Encounter (Signed)
Please see message and advise.  Thank you. ° °

## 2021-10-09 NOTE — Progress Notes (Signed)
GYNECOLOGY  VISIT  Subjective:    37 yrs Married Caucasian [id number]  female here to discuss recent BMD obtained 08/22/2021 showing osteoporosis in the femoral necks with t score -2.8 and -2.9.  No hx of recent fractures.  Is being followed at [facility] and working on weight loss.  This has been slow going and she is considering sleeve gastrectomy to help with weight loss.  Aware this could worsen bone density.    Osteoporosis Risk Factors  Tobacco use: no Low body weight (<127 lbs): no Estrogen deficiency with either early menopause (age <45) or bilateral ovariectomy: no Low calcium intake (lifelong): no Alcohol use more than 2 drinks per day: no Recurrent falls: no Inadequate physical activity: no  Current calcium and Vit D intake: 600mg  with Vit D  Past Medical History:  Diagnosis Date   Arthritis    knees, back   Bursitis    hips - almost gone per pt    Cancer ([place])    Carcinoid tumor of lung 2012   right found incidentally on chest xray eval for r/o vasxculitis    Carpal tunnel syndrome, bilateral    bilat but surgery to correct    Coronary artery calcification 07/23/2016   Double vision    history, no current problem   Dysrhythmia    irregular occassional, no current problem   Frequent UTI    HNP (herniated nucleus pulposus), cervical 01/22/2011   C6-C7  Has seen dr Vertell Limber for this and Dr Tonita Cong  Positional numbness in hands no weakness     Hypertension    Hypothyroidism    Infertility, female    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  Download 08/2014:  Good compliance, breakthru apnea on 10cm.  Changed to auto 5-12cm >> good control of AHI on f/u download ONO on CPAP:       Osteoarthritis    PCOS (polycystic ovarian syndrome)    Sleep apnea    wears cpap    SUI (stress urinary incontinence, female)    h/o   Current Outpatient Medications on File Prior to Visit   Medication Sig Dispense Refill   aspirin EC 81 MG tablet Take 81 mg by mouth at bedtime.     atorvastatin (LIPITOR) 40 MG tablet TAKE 1 TABLET DAILY 90 tablet 3   augmented betamethasone dipropionate (DIPROLENE-AF) 0.05 % ointment USE AS DIRECTED. DO NOT USE MORE THAN 5 DAYS IN A ROW OR MORE THAN 7 DAYS EACH MONTH. 45 g 0   CALCIUM PO Take 1 tablet by mouth at bedtime.      chlorthalidone (HYGROTON) 25 MG tablet Take 1 tablet (25 mg total) by mouth daily. 90 tablet 3   diltiazem (CARDIZEM CD) 180 MG 24 hr capsule Take 1 capsule (180 mg total) by mouth daily. 90 capsule 3   diltiazem (CARDIZEM) 30 MG tablet 1 tablet every 6 hours for palpitations 60 tablet 3   Dulaglutide (TRULICITY) 3 FY/1.0FB SOPN Inject 3 mg as directed once a week. 6 mL 0   estradiol (ESTRACE VAGINAL) 0.1 MG/GM vaginal cream INSERT 1 GRAM VAGINALLY TWICE WEEKLY 42.5 g 3   GEMTESA 75 MG TABS Take 1 tablet by mouth daily as needed.     metoprolol succinate (TOPROL-XL) 50 MG 24 hr tablet Take 1 tablet (50 mg total) by mouth daily. 90 tablet 3   Multiple Vitamins-Minerals (PRESERVISION AREDS 2 PO) Take 1  tablet by mouth 2 (two) times daily.     naproxen sodium (ALEVE) 220 MG tablet Take 220 mg by mouth 2 (two) times daily as needed (for pain.).     nitrofurantoin, macrocrystal-monohydrate, (MACROBID) 100 MG capsule Take 1 capsule (100 mg total) by mouth daily. 90 capsule 3   sulfamethoxazole-trimethoprim (BACTRIM DS) 800-160 MG tablet Take 1 tablet by mouth 2 (two) times daily. 6 tablet 2   SYNTHROID 137 MCG tablet TAKE 1 TABLET DAILY (NEED TO SCHEDULE AN APPOINTMENT FOR MORE REFILLS) 90 tablet 3   No current facility-administered medications on file prior to visit.   Allergies  Allergen Reactions   Neomycin Other (See Comments)    Inflammation--eye drop    Ace Inhibitors Cough    Review of Systems Pertinent items are noted in HPI.     Objective:   PHYSICAL EXAM BP 135/72 (BP Location: Left Arm, Patient Position:  Sitting, Cuff Size: Large)   Pulse 75   Ht 4\' 11"  (1.499 m) Comment: reported  Wt 218 lb 9.6 oz (99.2 kg)   LMP 02/12/2011   BMI 44.15 kg/m  General appearance: alert and no distress  Imaging Bone Density: Spine T Score: -0.3, Hip T Score: -2.9    FRAX score:  not calculated                                Assessment:   Osteoporosis with T score -2.9   Plan:   1.  PTH with calcium ordered 2.  Pharmacologic therapy therapy below discussed including risks and benefits:   Bisphosphonates po (Fosamax, Actonel, Boniva)  Bisphosphonate IV (Reclast)  Evista  Prolia subcutaneous   Forteo subcutaneous 3.  Will see if Prolia is covered without trying another medication first.  If not, will plan to start fosamax.    Total time with pt: 29 minutes

## 2021-10-10 ENCOUNTER — Encounter (HOSPITAL_BASED_OUTPATIENT_CLINIC_OR_DEPARTMENT_OTHER): Payer: Self-pay | Admitting: Physical Therapy

## 2021-10-10 ENCOUNTER — Other Ambulatory Visit: Payer: Self-pay

## 2021-10-10 ENCOUNTER — Ambulatory Visit (HOSPITAL_BASED_OUTPATIENT_CLINIC_OR_DEPARTMENT_OTHER): Payer: Medicare Other | Attending: Family Medicine | Admitting: Physical Therapy

## 2021-10-10 DIAGNOSIS — M545 Low back pain, unspecified: Secondary | ICD-10-CM | POA: Insufficient documentation

## 2021-10-10 DIAGNOSIS — M5416 Radiculopathy, lumbar region: Secondary | ICD-10-CM | POA: Insufficient documentation

## 2021-10-10 DIAGNOSIS — R262 Difficulty in walking, not elsewhere classified: Secondary | ICD-10-CM | POA: Insufficient documentation

## 2021-10-10 DIAGNOSIS — M6281 Muscle weakness (generalized): Secondary | ICD-10-CM | POA: Diagnosis present

## 2021-10-10 DIAGNOSIS — M25551 Pain in right hip: Secondary | ICD-10-CM | POA: Diagnosis present

## 2021-10-10 NOTE — Therapy (Signed)
OUTPATIENT PHYSICAL THERAPY THORACOLUMBAR EVALUATION   Patient Name: Nichole Smith MRN: [id number] DOB:1956/07/19, 65 y.o., female Today's Date: 10/10/2021   PT End of Session - 10/10/21 0810     Visit Number 1    Number of Visits 17    Date for PT Re-Evaluation 01/08/22    Authorization Type Medicare    PT Start Time 0802    PT Stop Time 0845    PT Time Calculation (min) 43 min    Activity Tolerance Patient tolerated treatment well    Behavior During Therapy [facility] for tasks assessed/performed             Past Medical History:  Diagnosis Date   Arthritis    knees, back   Bursitis    hips - almost gone per pt    Cancer ([place])    Carcinoid tumor of lung 2012   right found incidentally on chest xray eval for r/o vasxculitis    Carpal tunnel syndrome, bilateral    bilat but surgery to correct    Coronary artery calcification 07/23/2016   Double vision    history, no current problem   Dysrhythmia    irregular occassional, no current problem   Frequent UTI    HNP (herniated nucleus pulposus), cervical 01/22/2011   C6-C7  Has seen dr Vertell Limber for this and Dr Tonita Cong  Positional numbness in hands no weakness     Hypertension    Hypothyroidism    Infertility, female    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  Download 08/2014:  Good compliance, breakthru apnea on 10cm.  Changed to auto 5-12cm >> good control of AHI on f/u download ONO on CPAP:       Osteoarthritis    PCOS (polycystic ovarian syndrome)    Sleep apnea    wears cpap    SUI (stress urinary incontinence, female)    h/o   Past Surgical History:  Procedure Laterality Date   ANTERIOR CERVICAL DECOMP/DISCECTOMY FUSION  12/24/2017   Procedure: Cervical five-six Cervical six-seven Anterior cervical decompression/discectomy/fusion;  Surgeon: Erline Levine, MD;  Location: [place];  Service: Neurosurgery;;   CARPAL TUNNEL RELEASE Left  12/24/2017   Procedure: LEFT CARPAL TUNNEL RELEASE;  Surgeon: Erline Levine, MD;  Location: [place];  Service: Neurosurgery;  Laterality: Left;   CESAREAN SECTION     x 1 - twins   COLONOSCOPY     DILATATION & CURRETTAGE/HYSTEROSCOPY WITH RESECTOCOPE N/A 07/18/2013   Procedure: [place];  Surgeon: Lyman Speller, MD;  Location: [facility];  Service: Gynecology;  Laterality: N/A;  mass resection   implantable loop recorder placement  05/20/2021   Medtronic Reveal Linq model LNQ22 implantable loop recorder (SN [id number])   Lung tumor removed     Rt lung carcinoid   MOUTH SURGERY     pre-cancerous ulcer removed   POLYPECTOMY     SHOULDER OPEN ROTATOR CUFF REPAIR  07/20/2010   Right   WISDOM TOOTH EXTRACTION     Patient Active Problem List   Diagnosis Date Noted   Gluteal tendonitis of right buttock 06/19/2021   Greater trochanteric bursitis of right hip 05/08/2021   Chronic idiopathic constipation 05/01/2021   Lumbar radiculopathy 04/04/2021   Healthcare maintenance 12/25/2020   Insulin resistance 03/05/2020   Vitamin D deficiency 08/25/2019   Prediabetes 08/25/2019   Acute pain of right knee 94/85/4627   Lichen planus 03/50/0938  Cervical stenosis of spinal canal 12/24/2017   Hyperlipidemia LDL goal <70 08/26/2017   Heart palpitations 08/26/2017   Hammertoe of right foot 01/06/2017   Coronary artery calcification 07/23/2016   Periodic limb movements of sleep 06/18/2016   Atherosclerotic plaque 06/11/2016   Piriformis syndrome of left side 10/24/2015   Loss of transverse plantar arch of left foot 09/12/2015   Thyroiditis, autoimmune 02/26/2015   Obstructive sleep apnea 07/27/2014   Midline low back pain without sciatica 06/20/2014   Polycythemia, secondary 03/22/2014   Severe obesity (BMI >= 40) ([place]) 02/22/2014   Elevated hemoglobin ([place]) 02/22/2014   Left shoulder pain 02/04/2013   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   HNP  (herniated nucleus pulposus), cervical 01/22/2011   Visit for preventive health examination 01/22/2011   PAIN IN JOINT, MULTIPLE SITES 01/21/2010   Hypothyroidism 10/16/2008   Class 3 severe obesity with serious comorbidity and body mass index (BMI) of 40.0 to 44.9 in adult [facility]) 01/07/2008   PALPITATIONS, RECURRENT 01/07/2008   Essential hypertension 06/25/2007   OSTEOARTHRITIS 06/25/2007    PCP: Burnis Medin, MD  REFERRING PROVIDER: Lyndal Pulley, DO  REFERRING DIAG: [phone] (ICD-10-CM) - Lumbar radiculopathy   THERAPY DIAG:  Pain, lumbar region  Pain in right hip  Muscle weakness (generalized)  Difficulty walking  ONSET DATE: 2012  SUBJECTIVE:                                                                                                                                                                                           SUBJECTIVE STATEMENT:  Pt states she has had this back pain for 10 years. Pt reports she has been to PT before in the past for her back pain. More recently, she had to walk up really steep inclines and that caused more issues on R than L. She has been doing exercises for ITB, piriformis, bursisitis, etc. This episode, it has gotten much worse. She has had 2 injections with some relief  but they are started to wear off by the 4 week point. She is going to the neurosurgeon at some point. She wants some help with doing exercise correctly. She has been with [facility] program [facility] and has lost 30lbs. She states the progress has stalled. She currently works out at [employer] and gym. She does mostly arms but not as much with LE bc of the fear of injuring the back. She does deep water walking and modified swimming. She states she is very compliant with HEP. She does not enjoy going to the gym and using the machines but realizes she will probably need to  use the machines.   She locates the pain to the R hip and down the side of the R hip. Does not goe past  the knee. She cannot lay on R side and sleep.    PERTINENT HISTORY:  Pt reports osteoporosis was diagnosed by Dr. Sabra Heck.  PAIN:  Are you having pain? Yes VAS scale: 3/10 Pain location: R hip and SIJ Pain orientation: Right  PAIN TYPE: dull Pain description: constant  Aggravating factors: stairs, sitting too long, inclines Relieving factors: walking, movement, ice, NSAIDs   PRECAUTIONS: None  WEIGHT BEARING RESTRICTIONS No  FALLS:  Has patient fallen in last 6 months? No, Number of falls: 0  LIVING ENVIRONMENT: Lives with: lives with their spouse Lives in: House/apartment Stairs: No;  Has following equipment at home: None  OCCUPATION: retired   PLOF: Independent  PATIENT GOALS : She states she wants to build up her strength into her LE.    OBJECTIVE:   DIAGNOSTIC FINDINGS:   L1-L2: Broad-based disc bulge flattening the ventral thecal sac. Moderate bilateral facet arthropathy. Moderate bilateral foraminal stenosis. Mild spinal stenosis.   L2-L3: Broad-based disc bulge. Moderate bilateral facet arthropathy. Moderate spinal stenosis. Moderate-severe bilateral foraminal stenosis.   L3-L4: Mild broad-based disc bulge. Severe bilateral facet arthropathy. Severe spinal stenosis. Bilateral subarticular recess stenosis. Moderate-severe right foraminal stenosis. No left foraminal stenosis.   L4-L5: Broad-based disc bulge. Moderate bilateral facet arthropathy. Bilateral lateral recess narrowing. Moderate right foraminal stenosis. Mild left foraminal stenosis.   L5-S1: Minimal broad-based disc bulge. Severe bilateral facet arthropathy. Mild left foraminal stenosis. No right foraminal stenosis.   IMPRESSION: 1. Diffuse lumbar spine spondylosis as described above. 2. No acute osseous injury of the lumbar spine.  IMPRESSION: 1. No acute osseous injury of the pelvis. 2. Mild tendinosis of the left gluteus minimus tendon insertion. 3. Moderate tendinosis of the left  gluteus medius tendon insertion. 4. Severe tendinosis of the right gluteus medius tendon insertion with a partial thickness tear. 5. Moderate tendinosis of the right gluteus minimus tendon insertion. 6. Partial-thickness tear of the hamstring origins bilaterally.  PATIENT SURVEYS:  FOTO 55 62 at DC 3 pts MCII   SCREENING FOR RED FLAGS: Bowel or bladder incontinence: No Spinal tumors: No Cauda equina syndrome: No Compression fracture: No   COGNITION:  Overall cognitive status: Within functional limits for tasks assessed     SENSATION:  Light touch: Appears intact   POSTURE:  Decreased lumbar lordosis, increased T/S kyphosis  LUMBARAROM/PROM  A/PROM A/PROM  10/10/2021  Flexion 75%  Extension WFL  Right lateral flexion 75%  Left lateral flexion 75%  Right rotation 60%  Left rotation 60%   (Blank rows = not tested)  LE AROM/PROM:  A/PROM Right 10/10/2021 Left 10/10/2021  Hip flexion WFL 110  Hip extension 0 5  Hip abduction 30 30  Hip adduction    Hip internal rotation 15 20  Hip external rotation WFL, p! WFL   (Blank rows = not tested)  LE MMT:  MMT Right 10/10/2021 Left 10/10/2021  Hip flexion 4-/5 p! 4/5  Hip extension    Hip abduction 4/5 4/5  Hip adduction 4+/5 4+/5   (Blank rows = not tested)  LUMBAR SPECIAL TESTS:  Straight leg raise test: Negative, Slump test: Negative, FABER test: Positive, and Trendelenburg sign: Positive  PALPATION ASSESSMENT:  TTP and hypertonicity of R lateral hip rotators, TTP along R SIJ, hypertonicity of bilat L/S paraspinals  Decreased extension glide with L3-5 PA  FUNCTIONAL TESTS:  STS:  WFL Step up/down: difficulty with ascend and eccentric lowering control, bilat Trendelenburg and genu valgus on descent  GAIT: Distance walked: 64ft Assistive device utilized: None Level of assistance: Complete Independence Comments: toe out, bilat Trendelenburg, exaggerated hip rotation    TODAY'S TREATMENT    Exercises Supine Bridge with Resistance Band - 2 x daily - 7 x weekly - 2 sets - 10 reps - 2 hold Neutral Lumbar Spine Curl Up - 2 x daily - 7 x weekly - 3 sets - 10 reps Side Stepping with Resistance at Thighs - 2 x daily - 7 x weekly - 1 sets - 2 reps - 29ft hold   PATIENT EDUCATION:  Education details: MOI, diagnosis, prognosis, anatomy, exercise progression, DOMS expectations, muscle firing,  envelope of function, HEP, POC  Person educated: Patient Education method: Explanation, Demonstration, Tactile cues, Verbal cues, and Handouts Education comprehension: verbalized understanding, returned demonstration, verbal cues required, and tactile cues required   HOME EXERCISE PROGRAM: Access Code: [email or web address] URL: [email or web address] Date: 10/10/2021 Prepared by: Daleen Bo    ASSESSMENT:  CLINICAL IMPRESSION: Patient is a 65 y.o. female who was seen today for physical therapy evaluation and treatment for LBP. Pt's s/s appear consistent with R hip gluteal tendinopathy as well as lumbar radiculopathy- consistent with MRI hip and lumbar imaging. Objective impairments include Abnormal gait, decreased activity tolerance, decreased endurance, decreased mobility, difficulty walking, decreased ROM, decreased strength, hypomobility, increased muscle spasms, impaired flexibility, improper body mechanics, postural dysfunction, obesity, and pain. These impairments are limiting patient from cleaning, community activity, yard work, shopping, and exercise/weight loss . Personal factors including Age, Fitness, Time since onset of injury/illness/exacerbation, and 1-2 comorbidities:    are also affecting patient's functional outcome. Patient will benefit from skilled PT to address above impairments and improve overall function.  REHAB POTENTIAL: Good  CLINICAL DECISION MAKING: Stable/uncomplicated  EVALUATION COMPLEXITY: Low   GOALS:   SHORT TERM GOALS:  STG Name Target Date Goal  status  1 Pt will become independent with HEP in order to demonstrate synthesis of PT education.  10/24/2021 INITIAL  2 Pt will be able to demonstrate step up/down without pain and controlled eccentric in order to demonstrate functional improvement in LE function for self-care and community navigation.  11/07/2021 INITIAL  3 Pt will score at least 3 pt increase on FOTO to demonstrate functional improvement in MCII and pt perceived function.   11/07/2021 INITIAL   LONG TERM GOALS:   LTG Name Target Date Goal status  1 Pt  will become independent with final HEP in order to demonstrate synthesis of PT education.  12/05/2021 INITIAL  2 Pt will be able to lift/squat/hold >25 lbs in order to demonstrate functional improvement in lumbopelvic strength for safe return to exercise.   12/05/2021 INITIAL  3 Pt will be able to demonstrate/report ability to walk >30 mins without pain in order to demonstrate functional improvement and tolerance to exercise and community mobility.  12/05/2021 INITIAL  4 Pt will score >/= 62 on FOTO to demonstrate functional improvement in LBP and hip pain.  12/05/2021 INITIAL   PLAN: PT FREQUENCY: 1-2x/week  PT DURATION: 8 weeks  PLANNED INTERVENTIONS: Therapeutic exercises, Therapeutic activity, Neuro Muscular re-education, Balance training, Gait training, Patient/Family education, Joint mobilization, Stair training, Aquatic Therapy, Dry Needling, Electrical stimulation, Spinal mobilization, Cryotherapy, Moist heat, scar mobilization, Taping, Vasopneumatic device, Traction, Ultrasound, Ionotophoresis 4mg /ml Dexamethasone, and Manual therapy  PLAN FOR NEXT SESSION: STM/ joint mobs to L/S and R hip, review HEP, review squatting  and lifting technique, increased hip ABD isometric holds, hip hinging   Daleen Bo PT, DPT 10/10/21 11:32 AM

## 2021-10-14 ENCOUNTER — Ambulatory Visit (INDEPENDENT_AMBULATORY_CARE_PROVIDER_SITE_OTHER): Payer: Medicare Other

## 2021-10-14 VITALS — Ht 59.0 in | Wt 213.0 lb

## 2021-10-14 DIAGNOSIS — Z0000 Encounter for general adult medical examination without abnormal findings: Secondary | ICD-10-CM

## 2021-10-14 NOTE — Patient Instructions (Addendum)
Nichole Smith , Thank you for taking time to come for your Medicare Wellness Visit. I appreciate your ongoing commitment to your health goals. Please review the following plan we discussed and let me know if I can assist you in the future.   These are the goals we discussed:  Goals      Patient Stated     I will continue to exercise 6 days per week for 30 minutes.      Weight (lb) < 200 lb (90.7 kg)        This is a list of the screening recommended for you and due dates:  Health Maintenance  Topic Date Due   Urine Protein Check  Never done   COVID-19 Vaccine (5 - Booster for Moderna series) 10/26/2021   Mammogram  08/23/2023   Colon Cancer Screening  09/26/2023   Tetanus Vaccine  01/31/2024   Pneumonia Vaccine  Completed   Flu Shot  Completed   DEXA scan (bone density measurement)  Completed   Hepatitis C Screening: USPSTF Recommendation to screen - Ages 47-79 yo.  Completed   Zoster (Shingles) Vaccine  Completed   HPV Vaccine  Aged Out    Advanced directives: Yes  Conditions/risks identified: None  Next appointment: Follow up in one year for your annual wellness visit    Preventive Care 65 Years and Older, Female Preventive care refers to lifestyle choices and visits with your health care provider that can promote health and wellness. What does preventive care include? A yearly physical exam. This is also called an annual well check. Dental exams once or twice a year. Routine eye exams. Ask your health care provider how often you should have your eyes checked. Personal lifestyle choices, including: Daily care of your teeth and gums. Regular physical activity. Eating a healthy diet. Avoiding tobacco and drug use. Limiting alcohol use. Practicing safe sex. Taking low-dose aspirin every day. Taking vitamin and mineral supplements as recommended by your health care provider. What happens during an annual well check? The services and screenings done by your health  care provider during your annual well check will depend on your age, overall health, lifestyle risk factors, and family history of disease. Counseling  Your health care provider may ask you questions about your: Alcohol use. Tobacco use. Drug use. Emotional well-being. Home and relationship well-being. Sexual activity. Eating habits. History of falls. Memory and ability to understand (cognition). Work and work Statistician. Reproductive health. Screening  You may have the following tests or measurements: Height, weight, and BMI. Blood pressure. Lipid and cholesterol levels. These may be checked every 5 years, or more frequently if you are over 33 years old. Skin check. Lung cancer screening. You may have this screening every year starting at age 47 if you have a 30-pack-year history of smoking and currently smoke or have quit within the past 15 years. Fecal occult blood test (FOBT) of the stool. You may have this test every year starting at age 47. Flexible sigmoidoscopy or colonoscopy. You may have a sigmoidoscopy every 5 years or a colonoscopy every 10 years starting at age 43. Hepatitis C blood test. Hepatitis B blood test. Sexually transmitted disease (STD) testing. Diabetes screening. This is done by checking your blood sugar (glucose) after you have not eaten for a while (fasting). You may have this done every 1-3 years. Bone density scan. This is done to screen for osteoporosis. You may have this done starting at age 34. Mammogram. This may be done every  1-2 years. Talk to your health care provider about how often you should have regular mammograms. Talk with your health care provider about your test results, treatment options, and if necessary, the need for more tests. Vaccines  Your health care provider may recommend certain vaccines, such as: Influenza vaccine. This is recommended every year. Tetanus, diphtheria, and acellular pertussis (Tdap, Td) vaccine. You may need a Td  booster every 10 years. Zoster vaccine. You may need this after age 48. Pneumococcal 13-valent conjugate (PCV13) vaccine. One dose is recommended after age 17. Pneumococcal polysaccharide (PPSV23) vaccine. One dose is recommended after age 67. Talk to your health care provider about which screenings and vaccines you need and how often you need them. This information is not intended to replace advice given to you by your health care provider. Make sure you discuss any questions you have with your health care provider. Document Released: 12/14/2015 Document Revised: 08/06/2016 Document Reviewed: 09/18/2015 Elsevier Interactive Patient Education  2017 [place] Prevention in the Home Falls can cause injuries. They can happen to people of all ages. There are many things you can do to make your home safe and to help prevent falls. What can I do on the outside of my home? Regularly fix the edges of walkways and driveways and fix any cracks. Remove anything that might make you trip as you walk through a door, such as a raised step or threshold. Trim any bushes or trees on the path to your home. Use bright outdoor lighting. Clear any walking paths of anything that might make someone trip, such as rocks or tools. Regularly check to see if handrails are loose or broken. Make sure that both sides of any steps have handrails. Any raised decks and porches should have guardrails on the edges. Have any leaves, snow, or ice cleared regularly. Use sand or salt on walking paths during winter. Clean up any spills in your garage right away. This includes oil or grease spills. What can I do in the bathroom? Use night lights. Install grab bars by the toilet and in the tub and shower. Do not use towel bars as grab bars. Use non-skid mats or decals in the tub or shower. If you need to sit down in the shower, use a plastic, non-slip stool. Keep the floor dry. Clean up any water that spills on the floor  as soon as it happens. Remove soap buildup in the tub or shower regularly. Attach bath mats securely with double-sided non-slip rug tape. Do not have throw rugs and other things on the floor that can make you trip. What can I do in the bedroom? Use night lights. Make sure that you have a light by your bed that is easy to reach. Do not use any sheets or blankets that are too big for your bed. They should not hang down onto the floor. Have a firm chair that has side arms. You can use this for support while you get dressed. Do not have throw rugs and other things on the floor that can make you trip. What can I do in the kitchen? Clean up any spills right away. Avoid walking on wet floors. Keep items that you use a lot in easy-to-reach places. If you need to reach something above you, use a strong step stool that has a grab bar. Keep electrical cords out of the way. Do not use floor polish or wax that makes floors slippery. If you must use wax, use non-skid  floor wax. Do not have throw rugs and other things on the floor that can make you trip. What can I do with my stairs? Do not leave any items on the stairs. Make sure that there are handrails on both sides of the stairs and use them. Fix handrails that are broken or loose. Make sure that handrails are as long as the stairways. Check any carpeting to make sure that it is firmly attached to the stairs. Fix any carpet that is loose or worn. Avoid having throw rugs at the top or bottom of the stairs. If you do have throw rugs, attach them to the floor with carpet tape. Make sure that you have a light switch at the top of the stairs and the bottom of the stairs. If you do not have them, ask someone to add them for you. What else can I do to help prevent falls? Wear shoes that: Do not have high heels. Have rubber bottoms. Are comfortable and fit you well. Are closed at the toe. Do not wear sandals. If you use a stepladder: Make sure that it is  fully opened. Do not climb a closed stepladder. Make sure that both sides of the stepladder are locked into place. Ask someone to hold it for you, if possible. Clearly mark and make sure that you can see: Any grab bars or handrails. First and last steps. Where the edge of each step is. Use tools that help you move around (mobility aids) if they are needed. These include: Canes. Walkers. Scooters. Crutches. Turn on the lights when you go into a dark area. Replace any light bulbs as soon as they burn out. Set up your furniture so you have a clear path. Avoid moving your furniture around. If any of your floors are uneven, fix them. If there are any pets around you, be aware of where they are. Review your medicines with your doctor. Some medicines can make you feel dizzy. This can increase your chance of falling. Ask your doctor what other things that you can do to help prevent falls. This information is not intended to replace advice given to you by your health care provider. Make sure you discuss any questions you have with your health care provider. Document Released: 09/13/2009 Document Revised: 04/24/2016 Document Reviewed: 12/22/2014 Elsevier Interactive Patient Education  2017 [employer].

## 2021-10-14 NOTE — Progress Notes (Signed)
Subjective:   Nichole Smith is a 65 y.o. female who presents for Medicare Annual (Subsequent) preventive examination.  Review of Systems    No ROS  Cardiac Risk Factors include: advanced age (>40men, >31 women);hypertension    Objective:    Today's Vitals   10/14/21 0956  Weight: 213 lb (96.6 kg)  Height: 4\' 11"  (1.499 m)   Body mass index is 43.02 kg/m.  Advanced Directives 10/14/2021 10/10/2021 10/02/2020 12/24/2017 06/01/2016 08/27/2015 07/07/2014  Does Patient Have a Medical Advance Directive? Yes Yes Yes No Yes No;Yes Patient has advance directive, copy not in chart  Type of Advance Directive [place];Living will [place];Living will [place];Living will - [place];Living will Living will;Healthcare Power of Attorney Living will  Does patient want to make changes to medical advance directive? No - Patient declined No - Patient declined No - Patient declined - No - Patient declined - -  Copy of [place] in Chart? No - copy requested - No - copy requested - No - copy requested No - copy requested -  Would patient like information on creating a medical advance directive? - - - No - Patient declined - - -  Pre-existing out of facility DNR order (yellow form or pink MOST form) - - - - - - No    Current Medications (verified) Outpatient Encounter Medications as of 10/14/2021  Medication Sig   aspirin EC 81 MG tablet Take 81 mg by mouth at bedtime.   atorvastatin (LIPITOR) 40 MG tablet TAKE 1 TABLET DAILY   augmented betamethasone dipropionate (DIPROLENE-AF) 0.05 % ointment USE AS DIRECTED. DO NOT USE MORE THAN 5 DAYS IN A ROW OR MORE THAN 7 DAYS EACH MONTH.   CALCIUM PO Take 1 tablet by mouth at bedtime.    chlorthalidone (HYGROTON) 25 MG tablet Take 1 tablet (25 mg total) by mouth daily.   diltiazem (CARDIZEM CD) 180 MG 24 hr capsule Take 1 capsule (180 mg total) by mouth daily.    diltiazem (CARDIZEM) 30 MG tablet 1 tablet every 6 hours for palpitations   Dulaglutide (TRULICITY) 3 CZ/6.6AY SOPN Inject 3 mg as directed once a week.   estradiol (ESTRACE VAGINAL) 0.1 MG/GM vaginal cream INSERT 1 GRAM VAGINALLY TWICE WEEKLY   GEMTESA 75 MG TABS Take 1 tablet by mouth daily as needed.   metoprolol succinate (TOPROL-XL) 50 MG 24 hr tablet Take 1 tablet (50 mg total) by mouth daily.   Multiple Vitamins-Minerals (PRESERVISION AREDS 2 PO) Take 1 tablet by mouth 2 (two) times daily.   naproxen sodium (ALEVE) 220 MG tablet Take 220 mg by mouth 2 (two) times daily as needed (for pain.).   nitrofurantoin, macrocrystal-monohydrate, (MACROBID) 100 MG capsule Take 1 capsule (100 mg total) by mouth daily.   sulfamethoxazole-trimethoprim (BACTRIM DS) 800-160 MG tablet Take 1 tablet by mouth 2 (two) times daily.   SYNTHROID 137 MCG tablet TAKE 1 TABLET DAILY (NEED TO SCHEDULE AN APPOINTMENT FOR MORE REFILLS)   Insulin Pen Needle (BD PEN NEEDLE NANO 2ND GEN) 32G X 4 MM MISC 1 Package by Does not apply route 2 (two) times daily.   liraglutide (VICTOZA) 18 MG/3ML SOPN Inject 1.8 mg into the skin every morning.   No facility-administered encounter medications on file as of 10/14/2021.    Allergies (verified) Neomycin and Ace inhibitors   History: Past Medical History:  Diagnosis Date   Arthritis    knees, back  Bursitis    hips - almost gone per pt    Cancer [facility])    Carcinoid tumor of lung 2012   right found incidentally on chest xray eval for r/o vasxculitis    Carpal tunnel syndrome, bilateral    bilat but surgery to correct    Coronary artery calcification 07/23/2016   Double vision    history, no current problem   Dysrhythmia    irregular occassional, no current problem   Frequent UTI    HNP (herniated nucleus pulposus), cervical 01/22/2011   C6-C7  Has seen dr Vertell Limber for this and Dr Tonita Cong  Positional numbness in hands no weakness     Hypertension    Hypothyroidism     Infertility, female    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  Download 08/2014:  Good compliance, breakthru apnea on 10cm.  Changed to auto 5-12cm >> good control of AHI on f/u download ONO on CPAP:       Osteoarthritis    PCOS (polycystic ovarian syndrome)    Sleep apnea    wears cpap    SUI (stress urinary incontinence, female)    h/o   Past Surgical History:  Procedure Laterality Date   ANTERIOR CERVICAL DECOMP/DISCECTOMY FUSION  12/24/2017   Procedure: Cervical five-six Cervical six-seven Anterior cervical decompression/discectomy/fusion;  Surgeon: Erline Levine, MD;  Location: [place];  Service: Neurosurgery;;   CARPAL TUNNEL RELEASE Left 12/24/2017   Procedure: LEFT CARPAL TUNNEL RELEASE;  Surgeon: Erline Levine, MD;  Location: [place];  Service: Neurosurgery;  Laterality: Left;   CESAREAN SECTION     x 1 - twins   COLONOSCOPY     DILATATION & CURRETTAGE/HYSTEROSCOPY WITH RESECTOCOPE N/A 07/18/2013   Procedure: [place];  Surgeon: Lyman Speller, MD;  Location: [facility];  Service: Gynecology;  Laterality: N/A;  mass resection   implantable loop recorder placement  05/20/2021   Medtronic Reveal Linq model LNQ22 implantable loop recorder (SN [id number])   Lung tumor removed     Rt lung carcinoid   MOUTH SURGERY     pre-cancerous ulcer removed   POLYPECTOMY     SHOULDER OPEN ROTATOR CUFF REPAIR  07/20/2010   Right   WISDOM TOOTH EXTRACTION     Family History  Problem Relation Age of Onset   Stroke Mother        died age 27   Hypertension Mother    Sudden death Mother    Alcoholism Mother    Coronary artery disease Father        died age 90   Hypertension Father    Liver disease Father    Alcohol abuse Father    Obesity Father    Parkinson's disease Brother    Diabetes Brother    Hypertension Brother    Hypertension Maternal Uncle     Colon cancer Neg Hx    Rectal cancer Neg Hx    Stomach cancer Neg Hx    Colon polyps Neg Hx    Esophageal cancer Neg Hx    Social History   Socioeconomic History   Marital status: Married    Spouse name: [name]   Number of children: 2   Years of education: Not on file   Highest education level: Not on file  Occupational History   Occupation: retired    Fish farm manager: [employer]  Tobacco Use   Smoking status: Never  Smokeless tobacco: Never  Vaping Use   Vaping Use: Never used  Substance and Sexual Activity   Alcohol use: Not Currently   Drug use: No   Sexual activity: Not Currently    Partners: Male    Birth control/protection: Post-menopausal  Other Topics Concern   Not on file  Social History Narrative   hhof 2    2 Children at college and  beyond      [name]    Married    Retired age 35 2015   Recently moved taking care of grandchildren during the week.   Social Determinants of Health   Financial Resource Strain: Low Risk    Difficulty of Paying Living Expenses: Not hard at all  Food Insecurity: No Food Insecurity   Worried About Charity fundraiser in the Last Year: Never true   [place] in the Last Year: Never true  Transportation Needs: No Transportation Needs   Lack of Transportation (Medical): No   Lack of Transportation (Non-Medical): No  Physical Activity: Sufficiently Active   Days of Exercise per Week: 7 days   Minutes of Exercise per Session: 30 min  Stress: No Stress Concern Present   Feeling of Stress : Not at all  Social Connections: Socially Integrated   Frequency of Communication with Friends and Family: More than three times a week   Frequency of Social Gatherings with Friends and Family: More than three times a week   Attends Religious Services: More than 4 times per year   Active Member of [employer] or Organizations: Yes   Attends Music therapist: More than 4 times per year   Marital Status:  Married   Clinical Intake:  Pre-visit preparation completed: Yes  Diabetic? No  Interpreter Needed?: No  Activities of Daily Living  In your present state of health, do you have any difficulty performing the following activities: 10/14/2021  Hearing? N  Vision? N  Comment Wears glasses. Followed by Dr Jerline Pain  Difficulty concentrating or making decisions? N  Walking or climbing stairs? N  Dressing or bathing? N  Doing errands, shopping? N  Preparing Food and eating ? N  Using the Toilet? N  In the past six months, have you accidently leaked urine? N  Do you have problems with loss of bowel control? N  Managing your Medications? N  Managing your Finances? N  Housekeeping or managing your Housekeeping? N  Some recent data might be hidden    Patient Care Team: Panosh, Standley Brooking, MD as PCP - General Sueanne Margarita, MD as PCP - Cardiology (Cardiology) Jari Pigg, MD (Dermatology) Megan Salon, MD as Attending Physician (Gynecology) Earlie Server, MD as Attending Physician (Orthopedic Surgery)  Indicate any recent Medical Services you may have received from other than [facility] providers in the past year (date may be approximate).     Assessment:    This is a routine wellness examination  for Aalyah.  Virtual Visit via Telephone Note  I connected with  Nichole Smith on 10/14/21 at  9:45 AM EST by telephone and verified that I am speaking with the correct person using two identifiers.  Location: Patient: Home  Provider: Office Persons participating in the virtual visit: patient/Nurse Health Advisor   I discussed the limitations, risks, security and privacy concerns of performing an evaluation and management service by telephone and the availability of in person appointments. The patient expressed understanding and agreed to proceed.  Interactive audio and video  telecommunications were attempted between this nurse and patient, however failed, due to patient having  technical difficulties OR patient did not have access to video capability.  We continued and completed visit with audio only.  Some vital signs may be absent or patient reported.   [name], LPN   Hearing/Vision screen Hearing Screening - Comments:: No difficulty hearing Vision Screening - Comments:: Wears glasses. Followed by Dr Jerline Pain  Dietary issues and exercise activities discussed: Current Exercise Habits: Home exercise routine, Type of exercise: walking, Time (Minutes): 30, Frequency (Times/Week): 7, Weekly Exercise (Minutes/Week): 210, Intensity: Moderate  Diet: High Protein-Low Carb   Goals Addressed             This Visit's Progress    Patient Stated       I will continue to exercise 6 days per week for 30 minutes.        Depression Screen PHQ 2/9 Scores 10/14/2021 07/29/2021 10/02/2020 09/21/2019 01/12/2019 04/21/2017 05/27/2015  PHQ - 2 Score 0 0 0 0 4 0 0  PHQ- 9 Score - - 0 - 14 - -  Exception Documentation - - - - Medical reason - -    Fall Risk Fall Risk  10/14/2021 10/14/2021 10/02/2020 09/21/2019 04/21/2017  Falls in the past year? 0 0 0 0 No  Number falls in past yr: 0 - 0 0 -  Injury with Fall? 1 - 0 0 -  Risk for fall due to : - - No Fall Risks - -  Follow up - - Falls evaluation completed;Falls prevention discussed Falls evaluation completed -    FALL RISK PREVENTION PERTAINING TO THE HOME:  Any stairs in or around the home? Yes  If so, are there any without handrails? No  Home free of loose throw rugs in walkways, pet beds, electrical cords, etc? Yes  Adequate lighting in your home to reduce risk of falls? Yes   ASSISTIVE DEVICES UTILIZED TO PREVENT FALLS:  Life alert? No  Use of a cane, walker or w/c? No  Grab bars in the bathroom? Yes  Shower chair or bench in shower? Yes  Elevated toilet seat or a handicapped toilet? No   TIMED UP AND GO:  Was the test performed? No . Audio Visit.  Cognitive Function:     Immunizations Immunization History  Administered Date(s) Administered   Fluad Quad(high Dose 65+) 09/21/2019, 09/10/2020   Influenza Split 10/17/2011, 08/12/2012, 08/01/2013   Influenza, Quadrivalent, Recombinant, Inj, Pf 08/12/2018   Influenza,inj,Quad PF,6+ Mos 09/04/2014, 08/27/2015, 08/01/2016, 08/10/2017   Influenza-Unspecified 08/30/2018, 08/31/2021   Moderna SARS-COV2 Booster Vaccination 08/31/2021   Moderna Sars-Covid-2 Vaccination 01/27/2020, 09/22/2020, 12/15/2020, 03/12/2021   Pneumococcal Conjugate-13 10/05/2019   Pneumococcal Polysaccharide-23 09/10/2020   Td 12/01/1997, 10/16/2008   Tdap 01/30/2014   Zoster Recombinat (Shingrix) 01/12/2017, 04/02/2017   Zoster, Live 05/25/2012    Screening Tests Health Maintenance  Topic Date Due   URINE MICROALBUMIN  Never done   COVID-19 Vaccine (5 - Booster for Moderna series) 10/26/2021   MAMMOGRAM  08/23/2023   COLONOSCOPY (Pts 45-16yrs Insurance coverage will need to be confirmed)  09/26/2023   TETANUS/TDAP  01/31/2024   Pneumonia Vaccine 55+ Years old  Completed   INFLUENZA VACCINE  Completed   DEXA SCAN  Completed   Hepatitis C Screening  Completed   Zoster Vaccines- Shingrix  Completed   HPV VACCINES  Aged Out    Health Maintenance  Health Maintenance Due  Topic Date Due   URINE MICROALBUMIN  Never  done    Vision Screening: Recommended annual ophthalmology exams for early detection of glaucoma and other disorders of the eye. Is the patient up to date with their annual eye exam?  Yes  Who is the provider or what is the name of the office in which the patient attends annual eye exams? Followed by Dr Jerline Pain.  Dental Screening: Recommended annual dental exams for proper oral hygiene  Community Resource Referral / Chronic Care Management:  CRR required this visit?  No   CCM required this visit?  No      Plan:     I have personally reviewed and noted the following in the patient's chart:   Medical and  social history Use of alcohol, tobacco or illicit drugs  Current medications and supplements including opioid prescriptions. Currently not taking opioids. Functional ability and status Nutritional status Physical activity Advanced directives List of other physicians Hospitalizations, surgeries, and ER visits in previous 12 months Vitals Screenings to include cognitive, depression, and falls Referrals and appointments  In addition, I have reviewed and discussed with patient certain preventive protocols, quality metrics, and best practice recommendations. A written personalized care plan for preventive services as well as general preventive health recommendations were provided to patient.     [name], LPN   [id number]

## 2021-10-16 ENCOUNTER — Other Ambulatory Visit: Payer: Self-pay

## 2021-10-16 ENCOUNTER — Encounter (HOSPITAL_BASED_OUTPATIENT_CLINIC_OR_DEPARTMENT_OTHER): Payer: Self-pay | Admitting: Physical Therapy

## 2021-10-16 ENCOUNTER — Ambulatory Visit (HOSPITAL_BASED_OUTPATIENT_CLINIC_OR_DEPARTMENT_OTHER): Payer: Medicare Other | Admitting: Physical Therapy

## 2021-10-16 DIAGNOSIS — M25551 Pain in right hip: Secondary | ICD-10-CM

## 2021-10-16 DIAGNOSIS — M6281 Muscle weakness (generalized): Secondary | ICD-10-CM

## 2021-10-16 DIAGNOSIS — M545 Low back pain, unspecified: Secondary | ICD-10-CM | POA: Diagnosis not present

## 2021-10-16 DIAGNOSIS — R262 Difficulty in walking, not elsewhere classified: Secondary | ICD-10-CM

## 2021-10-16 NOTE — Therapy (Signed)
OUTPATIENT PHYSICAL THERAPY TREATMENT NOTE   Patient Name: Nichole Smith MRN: [id number] DOB:Apr 12, 1956, 65 y.o., female Today's Date: 10/16/2021  PCP: Burnis Medin, MD REFERRING PROVIDER: Burnis Medin, MD   PT End of Session - 10/16/21 1055     Visit Number 2    Number of Visits 17    Date for PT Re-Evaluation 01/08/22    Authorization Type Medicare    PT Start Time 1100    PT Stop Time 1140    PT Time Calculation (min) 40 min    Activity Tolerance Patient tolerated treatment well    Behavior During Therapy WFL for tasks assessed/performed             Past Medical History:  Diagnosis Date   Arthritis    knees, back   Bursitis    hips - almost gone per pt    Cancer ([place])    Carcinoid tumor of lung 2012   right found incidentally on chest xray eval for r/o vasxculitis    Carpal tunnel syndrome, bilateral    bilat but surgery to correct    Coronary artery calcification 07/23/2016   Double vision    history, no current problem   Dysrhythmia    irregular occassional, no current problem   Frequent UTI    HNP (herniated nucleus pulposus), cervical 01/22/2011   C6-C7  Has seen dr Vertell Limber for this and Dr Tonita Cong  Positional numbness in hands no weakness     Hypertension    Hypothyroidism    Infertility, female    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  Download 08/2014:  Good compliance, breakthru apnea on 10cm.  Changed to auto 5-12cm >> good control of AHI on f/u download ONO on CPAP:       Osteoarthritis    PCOS (polycystic ovarian syndrome)    Sleep apnea    wears cpap    SUI (stress urinary incontinence, female)    h/o   Past Surgical History:  Procedure Laterality Date   ANTERIOR CERVICAL DECOMP/DISCECTOMY FUSION  12/24/2017   Procedure: Cervical five-six Cervical six-seven Anterior cervical decompression/discectomy/fusion;  Surgeon: Erline Levine, MD;  Location: [facility];  Service: Neurosurgery;;   CARPAL TUNNEL RELEASE Left 12/24/2017   Procedure: LEFT CARPAL TUNNEL RELEASE;  Surgeon: Erline Levine, MD;  Location: [place];  Service: Neurosurgery;  Laterality: Left;   CESAREAN SECTION     x 1 - twins   COLONOSCOPY     DILATATION & CURRETTAGE/HYSTEROSCOPY WITH RESECTOCOPE N/A 07/18/2013   Procedure: [place];  Surgeon: Lyman Speller, MD;  Location: [facility];  Service: Gynecology;  Laterality: N/A;  mass resection   implantable loop recorder placement  05/20/2021   Medtronic Reveal Linq model LNQ22 implantable loop recorder (SN [id number])   Lung tumor removed     Rt lung carcinoid   MOUTH SURGERY     pre-cancerous ulcer removed   POLYPECTOMY     SHOULDER OPEN ROTATOR CUFF REPAIR  07/20/2010   Right   WISDOM TOOTH EXTRACTION     Patient Active Problem List   Diagnosis Date Noted   Gluteal tendonitis of right buttock 06/19/2021   Greater trochanteric bursitis of right hip 05/08/2021   Chronic idiopathic constipation 05/01/2021   Lumbar radiculopathy 04/04/2021   Healthcare maintenance 12/25/2020   Insulin resistance 03/05/2020   Vitamin D deficiency 08/25/2019   Prediabetes 08/25/2019  Acute pain of right knee 68/10/5725   Lichen planus 20/35/5974   Cervical stenosis of spinal canal 12/24/2017   Hyperlipidemia LDL goal <70 08/26/2017   Heart palpitations 08/26/2017   Hammertoe of right foot 01/06/2017   Coronary artery calcification 07/23/2016   Periodic limb movements of sleep 06/18/2016   Atherosclerotic plaque 06/11/2016   Piriformis syndrome of left side 10/24/2015   Loss of transverse plantar arch of left foot 09/12/2015   Thyroiditis, autoimmune 02/26/2015   Obstructive sleep apnea 07/27/2014   Midline low back pain without sciatica 06/20/2014   Polycythemia, secondary 03/22/2014   Severe obesity (BMI >= 40) ([place]) 02/22/2014   Elevated hemoglobin ([place]) 02/22/2014   Left shoulder pain  02/04/2013   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   HNP (herniated nucleus pulposus), cervical 01/22/2011   Visit for preventive health examination 01/22/2011   PAIN IN JOINT, MULTIPLE SITES 01/21/2010   Hypothyroidism 10/16/2008   Class 3 severe obesity with serious comorbidity and body mass index (BMI) of 40.0 to 44.9 in adult [facility]) 01/07/2008   PALPITATIONS, RECURRENT 01/07/2008   Essential hypertension 06/25/2007   OSTEOARTHRITIS 06/25/2007   REFERRING PROVIDER: Lyndal Pulley, DO   REFERRING DIAG: [phone] (ICD-10-CM) - Lumbar radiculopathy  THERAPY DIAG:  Pain, lumbar region  Pain in right hip  Muscle weakness (generalized)  Difficulty walking  PERTINENT HISTORY: Pt reports osteoporosis was diagnosed by Dr. Sabra Heck.    SUBJECTIVE: Pt states that she has has had some issues with HEP due to the video discrepancy. She states the curl up is stressful on the neck. She has not done pool recently.  PAIN:  Are you having pain? Yes VAS scale: 4/10 Pain location: R hip and SIJ Pain orientation: Right  PAIN TYPE: dull Pain description: constant  Aggravating factors: stairs, sitting too long, inclines Relieving factors: walking, movement, ice, NSAIDs    PLOF: Independent   PATIENT GOALS : She states she wants to build up her strength into her LE.       OBJECTIVE: DIAGNOSTIC FINDINGS:    L1-L2: Broad-based disc bulge flattening the ventral thecal sac. Moderate bilateral facet arthropathy. Moderate bilateral foraminal stenosis. Mild spinal stenosis.   L2-L3: Broad-based disc bulge. Moderate bilateral facet arthropathy. Moderate spinal stenosis. Moderate-severe bilateral foraminal stenosis.   L3-L4: Mild broad-based disc bulge. Severe bilateral facet arthropathy. Severe spinal stenosis. Bilateral subarticular recess stenosis. Moderate-severe right foraminal stenosis. No left foraminal stenosis.   L4-L5: Broad-based disc bulge. Moderate bilateral facet  arthropathy. Bilateral lateral recess narrowing. Moderate right foraminal stenosis. Mild left foraminal stenosis.   L5-S1: Minimal broad-based disc bulge. Severe bilateral facet arthropathy. Mild left foraminal stenosis. No right foraminal stenosis.   IMPRESSION: 1. Diffuse lumbar spine spondylosis as described above. 2. No acute osseous injury of the lumbar spine.   IMPRESSION: 1. No acute osseous injury of the pelvis. 2. Mild tendinosis of the left gluteus minimus tendon insertion. 3. Moderate tendinosis of the left gluteus medius tendon insertion. 4. Severe tendinosis of the right gluteus medius tendon insertion with a partial thickness tear. 5. Moderate tendinosis of the right gluteus minimus tendon insertion. 6. Partial-thickness tear of the hamstring origins bilaterally.   PATIENT SURVEYS:  FOTO 55 62 at DC 3 pts MCII   TODAY'S TREATMENT:  11/16 TREATMENT    Supine Bridge with Resistance Band - 2x10 BTB at knees Neutral Lumbar Spine Curl Up - 2x10 (one hand support neck) Side Stepping with Resistance at Thighs - 2x laps 14f   Standing hip hike-  foot elevated on box 2x10 each Hip hinge with BTB at knees to table 2x10  STM: bilat lumbar paraspinals Joint mobs: L1-5 UPA and L CPA grade III, R ant innominate rotation MET with shotgun x2 (improved after second)     PATIENT EDUCATION:  Education details: MOI, diagnosis, prognosis, anatomy, exercise progression, DOMS expectations, muscle firing,  envelope of function, HEP, POC   Person educated: Patient Education method: Explanation, Demonstration, Tactile cues, Verbal cues, and Handouts Education comprehension: verbalized understanding, returned demonstration, verbal cues required, and tactile cues required     HOME EXERCISE PROGRAM: Access Code: [email or web address] URL: [email or web address] Date: 10/16/2021 Prepared by: Daleen Bo     ASSESSMENT:   CLINICAL IMPRESSION: Pt with report of improved pain  into the L/S and SIJ following manual therapy and exercise today. Pt able to progress HEP today to include more active lumbopelvic dissociation as well as progressed R hip ABD strength. Pt without exacerbation of pain today. Pt doing well with rehab progression and HEP updated accordingly to continue with more independent pain management. Pt still has difficulty with hip muscle spasm and lumbar soft tissue tension. Likely to repeat manual at next session and review hip hinging with hopes of progressing to lifting/squatting mechanics.  Objective impairments include Abnormal gait, decreased activity tolerance, decreased endurance, decreased mobility, difficulty walking, decreased ROM, decreased strength, hypomobility, increased muscle spasms, impaired flexibility, improper body mechanics, postural dysfunction, obesity, and pain. These impairments are limiting patient from cleaning, community activity, yard work, shopping, and exercise/weight loss . Personal factors including Age, Fitness, Time since onset of injury/illness/exacerbation, and 1-2 comorbidities:    are also affecting patient's functional outcome. Patient will benefit from skilled PT to address above impairments and improve overall function.   REHAB POTENTIAL: Good   CLINICAL DECISION MAKING: Stable/uncomplicated   EVALUATION COMPLEXITY: Low     GOALS:     SHORT TERM GOALS:   STG Name Target Date Goal status  1 Pt will become independent with HEP in order to demonstrate synthesis of PT education.   10/24/2021 INITIAL  2 Pt will be able to demonstrate step up/down without pain and controlled eccentric in order to demonstrate functional improvement in LE function for self-care and community navigation.   11/07/2021 INITIAL  3 Pt will score at least 3 pt increase on FOTO to demonstrate functional improvement in MCII and pt perceived function.    11/07/2021 INITIAL    LONG TERM GOALS:    LTG Name Target Date Goal status  1 Pt  will  become independent with final HEP in order to demonstrate synthesis of PT education.   12/05/2021 INITIAL  2 Pt will be able to lift/squat/hold >25 lbs in order to demonstrate functional improvement in lumbopelvic strength for safe return to exercise.    12/05/2021 INITIAL  3 Pt will be able to demonstrate/report ability to walk >30 mins without pain in order to demonstrate functional improvement and tolerance to exercise and community mobility.   12/05/2021 INITIAL  4 Pt will score >/= 62 on FOTO to demonstrate functional improvement in LBP and hip pain.   12/05/2021 INITIAL    PLAN: PT FREQUENCY: 1-2x/week   PT DURATION: 8 weeks   PLANNED INTERVENTIONS: Therapeutic exercises, Therapeutic activity, Neuro Muscular re-education, Balance training, Gait training, Patient/Family education, Joint mobilization, Stair training, Aquatic Therapy, Dry Needling, Electrical stimulation, Spinal mobilization, Cryotherapy, Moist heat, scar mobilization, Taping, Vasopneumatic device, Traction, Ultrasound, Ionotophoresis 68m/ml Dexamethasone, and Manual therapy   PLAN FOR NEXT SESSION: STM/ joint  mobs to L/S and R hip, review HEP, review squatting and lifting technique, re-check SIJ   Daleen Bo PT, DPT 10/16/21 12:56 PM

## 2021-10-22 ENCOUNTER — Ambulatory Visit (HOSPITAL_BASED_OUTPATIENT_CLINIC_OR_DEPARTMENT_OTHER): Payer: Medicare Other | Admitting: Physical Therapy

## 2021-10-22 ENCOUNTER — Encounter (HOSPITAL_BASED_OUTPATIENT_CLINIC_OR_DEPARTMENT_OTHER): Payer: Self-pay | Admitting: Physical Therapy

## 2021-10-22 ENCOUNTER — Other Ambulatory Visit: Payer: Self-pay

## 2021-10-22 DIAGNOSIS — R262 Difficulty in walking, not elsewhere classified: Secondary | ICD-10-CM

## 2021-10-22 DIAGNOSIS — M25551 Pain in right hip: Secondary | ICD-10-CM

## 2021-10-22 DIAGNOSIS — M545 Low back pain, unspecified: Secondary | ICD-10-CM | POA: Diagnosis not present

## 2021-10-22 DIAGNOSIS — M6281 Muscle weakness (generalized): Secondary | ICD-10-CM

## 2021-10-22 NOTE — Therapy (Signed)
OUTPATIENT PHYSICAL THERAPY TREATMENT NOTE   Patient Name: Nichole Smith MRN: [id number] DOB:[birth year], 65 y.o., female Today's Date: 10/22/2021  PCP: Burnis Medin, MD REFERRING PROVIDER: Burnis Medin, MD   PT End of Session - 10/22/21 1400     Visit Number 3    Number of Visits 17    Date for PT Re-Evaluation 01/08/22    Authorization Type Medicare    PT Start Time 1305    PT Stop Time 1345    PT Time Calculation (min) 40 min    Activity Tolerance Patient tolerated treatment well    Behavior During Therapy [facility] for tasks assessed/performed              Past Medical History:  Diagnosis Date   Arthritis    knees, back   Bursitis    hips - almost gone per pt    Cancer ([employer])    Carcinoid tumor of lung 2012   right found incidentally on chest xray eval for r/o vasxculitis    Carpal tunnel syndrome, bilateral    bilat but surgery to correct    Coronary artery calcification 07/23/2016   Double vision    history, no current problem   Dysrhythmia    irregular occassional, no current problem   Frequent UTI    HNP (herniated nucleus pulposus), cervical 01/22/2011   C6-C7  Has seen dr Vertell Limber for this and Dr Tonita Cong  Positional numbness in hands no weakness     Hypertension    Hypothyroidism    Infertility, female    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  Download 08/2014:  Good compliance, breakthru apnea on 10cm.  Changed to auto 5-12cm >> good control of AHI on f/u download ONO on CPAP:       Osteoarthritis    PCOS (polycystic ovarian syndrome)    Sleep apnea    wears cpap    SUI (stress urinary incontinence, female)    h/o   Past Surgical History:  Procedure Laterality Date   ANTERIOR CERVICAL DECOMP/DISCECTOMY FUSION  12/24/2017   Procedure: Cervical five-six Cervical six-seven Anterior cervical decompression/discectomy/fusion;  Surgeon: Erline Levine, MD;  Location:  [place];  Service: Neurosurgery;;   CARPAL TUNNEL RELEASE Left 12/24/2017   Procedure: LEFT CARPAL TUNNEL RELEASE;  Surgeon: Erline Levine, MD;  Location: [facility];  Service: Neurosurgery;  Laterality: Left;   CESAREAN SECTION     x 1 - twins   COLONOSCOPY     DILATATION & CURRETTAGE/HYSTEROSCOPY WITH RESECTOCOPE N/A 07/18/2013   Procedure: [place];  Surgeon: Lyman Speller, MD;  Location: [facility];  Service: Gynecology;  Laterality: N/A;  mass resection   implantable loop recorder placement  05/20/2021   Medtronic Reveal Linq model LNQ22 implantable loop recorder (SN [id number])   Lung tumor removed     Rt lung carcinoid   MOUTH SURGERY     pre-cancerous ulcer removed   POLYPECTOMY     SHOULDER OPEN ROTATOR CUFF REPAIR  07/20/2010   Right   WISDOM TOOTH EXTRACTION     Patient Active Problem List   Diagnosis Date Noted   Gluteal tendonitis of right buttock 06/19/2021   Greater trochanteric bursitis of right hip 05/08/2021   Chronic idiopathic constipation 05/01/2021   Lumbar radiculopathy 04/04/2021   Healthcare maintenance 12/25/2020   Insulin resistance 03/05/2020   Vitamin D deficiency 08/25/2019   Prediabetes 08/25/2019  Acute pain of right knee 18/29/9371   Lichen planus 69/67/8938   Cervical stenosis of spinal canal 12/24/2017   Hyperlipidemia LDL goal <70 08/26/2017   Heart palpitations 08/26/2017   Hammertoe of right foot 01/06/2017   Coronary artery calcification 07/23/2016   Periodic limb movements of sleep 06/18/2016   Atherosclerotic plaque 06/11/2016   Piriformis syndrome of left side 10/24/2015   Loss of transverse plantar arch of left foot 09/12/2015   Thyroiditis, autoimmune 02/26/2015   Obstructive sleep apnea 07/27/2014   Midline low back pain without sciatica 06/20/2014   Polycythemia, secondary 03/22/2014   Severe obesity (BMI >= 40) ([place]) 02/22/2014   Elevated hemoglobin ([place]) 02/22/2014   Left shoulder  pain 02/04/2013   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   HNP (herniated nucleus pulposus), cervical 01/22/2011   Visit for preventive health examination 01/22/2011   PAIN IN JOINT, MULTIPLE SITES 01/21/2010   Hypothyroidism 10/16/2008   Class 3 severe obesity with serious comorbidity and body mass index (BMI) of 40.0 to 44.9 in adult [facility]) 01/07/2008   PALPITATIONS, RECURRENT 01/07/2008   Essential hypertension 06/25/2007   OSTEOARTHRITIS 06/25/2007   REFERRING PROVIDER: Lyndal Pulley, DO   REFERRING DIAG: [phone] (ICD-10-CM) - Lumbar radiculopathy  THERAPY DIAG:  Pain, lumbar region  Muscle weakness (generalized)  Difficulty walking  Pain in right hip  PERTINENT HISTORY: Pt reports osteoporosis was diagnosed by Dr. Sabra Heck.    SUBJECTIVE: Pt states she has mixed days of pain still. Being more active helps. The L side is more uncomfortable today compared to the R.   PAIN:  Are you having pain? No VAS scale: 0/10 Pain location: R hip and SIJ Pain orientation: Right  PAIN TYPE: dull Pain description: constant  Aggravating factors: stairs, sitting too long, inclines Relieving factors: walking, movement, ice, NSAIDs    PLOF: Independent   PATIENT GOALS : She states she wants to build up her strength into her LE.       OBJECTIVE: DIAGNOSTIC FINDINGS:    L1-L2: Broad-based disc bulge flattening the ventral thecal sac. Moderate bilateral facet arthropathy. Moderate bilateral foraminal stenosis. Mild spinal stenosis.   L2-L3: Broad-based disc bulge. Moderate bilateral facet arthropathy. Moderate spinal stenosis. Moderate-severe bilateral foraminal stenosis.   L3-L4: Mild broad-based disc bulge. Severe bilateral facet arthropathy. Severe spinal stenosis. Bilateral subarticular recess stenosis. Moderate-severe right foraminal stenosis. No left foraminal stenosis.   L4-L5: Broad-based disc bulge. Moderate bilateral facet arthropathy. Bilateral lateral recess  narrowing. Moderate right foraminal stenosis. Mild left foraminal stenosis.   L5-S1: Minimal broad-based disc bulge. Severe bilateral facet arthropathy. Mild left foraminal stenosis. No right foraminal stenosis.   IMPRESSION: 1. Diffuse lumbar spine spondylosis as described above. 2. No acute osseous injury of the lumbar spine.   IMPRESSION: 1. No acute osseous injury of the pelvis. 2. Mild tendinosis of the left gluteus minimus tendon insertion. 3. Moderate tendinosis of the left gluteus medius tendon insertion. 4. Severe tendinosis of the right gluteus medius tendon insertion with a partial thickness tear. 5. Moderate tendinosis of the right gluteus minimus tendon insertion. 6. Partial-thickness tear of the hamstring origins bilaterally.   PATIENT SURVEYS:  FOTO 55 62 at DC 3 pts MCII   TODAY'S TREATMENT:  11/22 TREATMENT    Supine Bridge with Resistance Band - 2x10 BTB at knees Neutral Lumbar Spine Curl Up - 2x10 (one hand support neck) Side Stepping with Resistance at Thighs - 2x laps 25f   Standing hip hike- foot elevated on box 2x10 each Hip  hinge with BTB at knees to table 2x10 Seated QL stretch 30s 3x (particular emphasis on L QL)   STM: bilat lumbar paraspinals Joint mobs: L1-5 UPA and L CPA grade III, R ant innominate rotation MET with shotgun    11/16 TREATMENT    Supine Bridge with Resistance Band - 2x10 BTB at knees Neutral Lumbar Spine Curl Up - 2x10 (one hand support neck) Side Stepping with Resistance at Thighs - 2x laps 45f   Standing hip hike- foot elevated on box 2x10 each Hip hinge with BTB at knees to table 2x10  STM: bilat lumbar paraspinals Joint mobs: L1-5 UPA and L CPA grade III, R ant innominate rotation MET with shotgun x2 (improved after second)     PATIENT EDUCATION:  Education details: MOI, diagnosis, prognosis, anatomy, exercise progression, DOMS expectations, muscle firing,  envelope of function, HEP, POC   Person educated:  Patient Education method: Explanation, Demonstration, Tactile cues, Verbal cues, and Handouts Education comprehension: verbalized understanding, returned demonstration, verbal cues required, and tactile cues required     HOME EXERCISE PROGRAM: Access Code: [email or web address] URL: [email or web address] Date: 10/16/2021 Prepared by: ADaleen Bo    ASSESSMENT:   CLINICAL IMPRESSION: Pt with difficulty during hip hinging review due to decreased lumbar dissociation. Pt with increased L QL/paraspinal stiffness today that was relieved with STM. Pt required VC and TC throughout and external cuing in order to facilitate hip hinge position. Pt HEP not significantly updated in order to facilitate more continuous independent practice. No external loading added to hip hinge or addition of RDL yet until pt able to demo movement without significant cuing. Plan to revisit  Objective impairments include Abnormal gait, decreased activity tolerance, decreased endurance, decreased mobility, difficulty walking, decreased ROM, decreased strength, hypomobility, increased muscle spasms, impaired flexibility, improper body mechanics, postural dysfunction, obesity, and pain. These impairments are limiting patient from cleaning, community activity, yard work, shopping, and exercise/weight loss . Personal factors including Age, Fitness, Time since onset of injury/illness/exacerbation, and 1-2 comorbidities:    are also affecting patient's functional outcome. Patient will benefit from skilled PT to address above impairments and improve overall function.   REHAB POTENTIAL: Good   CLINICAL DECISION MAKING: Stable/uncomplicated   EVALUATION COMPLEXITY: Low     GOALS:     SHORT TERM GOALS:   STG Name Target Date Goal status  1 Pt will become independent with HEP in order to demonstrate synthesis of PT education.   10/24/2021 INITIAL  2 Pt will be able to demonstrate step up/down without pain and controlled  eccentric in order to demonstrate functional improvement in LE function for self-care and community navigation.   11/07/2021 INITIAL  3 Pt will score at least 3 pt increase on FOTO to demonstrate functional improvement in MCII and pt perceived function.    11/07/2021 INITIAL    LONG TERM GOALS:    LTG Name Target Date Goal status  1 Pt  will become independent with final HEP in order to demonstrate synthesis of PT education.   12/05/2021 INITIAL  2 Pt will be able to lift/squat/hold >25 lbs in order to demonstrate functional improvement in lumbopelvic strength for safe return to exercise.    12/05/2021 INITIAL  3 Pt will be able to demonstrate/report ability to walk >30 mins without pain in order to demonstrate functional improvement and tolerance to exercise and community mobility.   12/05/2021 INITIAL  4 Pt will score >/= 62 on FOTO to demonstrate functional improvement in LBP and hip pain.  12/05/2021 INITIAL    PLAN: PT FREQUENCY: 1-2x/week   PT DURATION: 8 weeks   PLANNED INTERVENTIONS: Therapeutic exercises, Therapeutic activity, Neuro Muscular re-education, Balance training, Gait training, Patient/Family education, Joint mobilization, Stair training, Aquatic Therapy, Dry Needling, Electrical stimulation, Spinal mobilization, Cryotherapy, Moist heat, scar mobilization, Taping, Vasopneumatic device, Traction, Ultrasound, Ionotophoresis 82m/ml Dexamethasone, and Manual therapy   PLAN FOR NEXT SESSION: STM/ joint mobs to L/S and R hip, review HEP, review squatting and lifting technique, re-check SIJ   ADaleen BoPT, DPT 10/22/21 2:03 PM

## 2021-10-30 ENCOUNTER — Other Ambulatory Visit: Payer: Self-pay

## 2021-10-30 ENCOUNTER — Encounter (HOSPITAL_BASED_OUTPATIENT_CLINIC_OR_DEPARTMENT_OTHER): Payer: Self-pay | Admitting: Physical Therapy

## 2021-10-30 ENCOUNTER — Ambulatory Visit (HOSPITAL_BASED_OUTPATIENT_CLINIC_OR_DEPARTMENT_OTHER): Payer: Medicare Other | Admitting: Physical Therapy

## 2021-10-30 DIAGNOSIS — M25551 Pain in right hip: Secondary | ICD-10-CM

## 2021-10-30 DIAGNOSIS — M545 Low back pain, unspecified: Secondary | ICD-10-CM

## 2021-10-30 DIAGNOSIS — M6281 Muscle weakness (generalized): Secondary | ICD-10-CM

## 2021-10-30 DIAGNOSIS — R262 Difficulty in walking, not elsewhere classified: Secondary | ICD-10-CM

## 2021-10-30 NOTE — Therapy (Signed)
OUTPATIENT PHYSICAL THERAPY TREATMENT NOTE   Patient Name: Nichole Smith MRN: [id number] DOB:05-Dec-1955, 65 y.o., female Today's Date: 10/30/2021  PCP: Burnis Medin, MD REFERRING PROVIDER: Burnis Medin, MD   PT End of Session - 10/30/21 1513     Visit Number 4    Number of Visits 17    Date for PT Re-Evaluation 01/08/22    Authorization Type Medicare    PT Start Time 7673    PT Stop Time 1555    PT Time Calculation (min) 40 min    Activity Tolerance Patient tolerated treatment well    Behavior During Therapy [facility] for tasks assessed/performed               Past Medical History:  Diagnosis Date   Arthritis    knees, back   Bursitis    hips - almost gone per pt    Cancer ([place])    Carcinoid tumor of lung 2012   right found incidentally on chest xray eval for r/o vasxculitis    Carpal tunnel syndrome, bilateral    bilat but surgery to correct    Coronary artery calcification 07/23/2016   Double vision    history, no current problem   Dysrhythmia    irregular occassional, no current problem   Frequent UTI    HNP (herniated nucleus pulposus), cervical 01/22/2011   C6-C7  Has seen dr Vertell Limber for this and Dr Tonita Cong  Positional numbness in hands no weakness     Hypertension    Hypothyroidism    Infertility, female    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  Download 08/2014:  Good compliance, breakthru apnea on 10cm.  Changed to auto 5-12cm >> good control of AHI on f/u download ONO on CPAP:       Osteoarthritis    PCOS (polycystic ovarian syndrome)    Sleep apnea    wears cpap    SUI (stress urinary incontinence, female)    h/o   Past Surgical History:  Procedure Laterality Date   ANTERIOR CERVICAL DECOMP/DISCECTOMY FUSION  12/24/2017   Procedure: Cervical five-six Cervical six-seven Anterior cervical decompression/discectomy/fusion;  Surgeon: Erline Levine, MD;  Location:  [place];  Service: Neurosurgery;;   CARPAL TUNNEL RELEASE Left 12/24/2017   Procedure: LEFT CARPAL TUNNEL RELEASE;  Surgeon: Erline Levine, MD;  Location: [place];  Service: Neurosurgery;  Laterality: Left;   CESAREAN SECTION     x 1 - twins   COLONOSCOPY     DILATATION & CURRETTAGE/HYSTEROSCOPY WITH RESECTOCOPE N/A 07/18/2013   Procedure: [place];  Surgeon: Lyman Speller, MD;  Location: [facility];  Service: Gynecology;  Laterality: N/A;  mass resection   implantable loop recorder placement  05/20/2021   Medtronic Reveal Linq model [id number] implantable loop recorder (SN [id number])   Lung tumor removed     Rt lung carcinoid   MOUTH SURGERY     pre-cancerous ulcer removed   POLYPECTOMY     SHOULDER OPEN ROTATOR CUFF REPAIR  07/20/2010   Right   WISDOM TOOTH EXTRACTION     Patient Active Problem List   Diagnosis Date Noted   Gluteal tendonitis of right buttock 06/19/2021   Greater trochanteric bursitis of right hip 05/08/2021   Chronic idiopathic constipation 05/01/2021   Lumbar radiculopathy 04/04/2021   Healthcare maintenance 12/25/2020   Insulin resistance 03/05/2020   Vitamin D deficiency 08/25/2019   Prediabetes  08/25/2019   Acute pain of right knee 50/38/8828   Lichen planus 00/34/9179   Cervical stenosis of spinal canal 12/24/2017   Hyperlipidemia LDL goal <70 08/26/2017   Heart palpitations 08/26/2017   Hammertoe of right foot 01/06/2017   Coronary artery calcification 07/23/2016   Periodic limb movements of sleep 06/18/2016   Atherosclerotic plaque 06/11/2016   Piriformis syndrome of left side 10/24/2015   Loss of transverse plantar arch of left foot 09/12/2015   Thyroiditis, autoimmune 02/26/2015   Obstructive sleep apnea 07/27/2014   Midline low back pain without sciatica 06/20/2014   Polycythemia, secondary 03/22/2014   Severe obesity (BMI >= 40) ([place]) 02/22/2014   Elevated hemoglobin ([place]) 02/22/2014   Left shoulder  pain 02/04/2013   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   HNP (herniated nucleus pulposus), cervical 01/22/2011   Visit for preventive health examination 01/22/2011   PAIN IN JOINT, MULTIPLE SITES 01/21/2010   Hypothyroidism 10/16/2008   Class 3 severe obesity with serious comorbidity and body mass index (BMI) of 40.0 to 44.9 in adult [facility]) 01/07/2008   PALPITATIONS, RECURRENT 01/07/2008   Essential hypertension 06/25/2007   OSTEOARTHRITIS 06/25/2007   REFERRING PROVIDER: Lyndal Pulley, DO   REFERRING DIAG: [phone] (ICD-10-CM) - Lumbar radiculopathy  THERAPY DIAG:  Pain, lumbar region  Muscle weakness (generalized)  Difficulty walking  Pain in right hip  PERTINENT HISTORY: Pt reports osteoporosis was diagnosed by Dr. Sabra Heck.    SUBJECTIVE: Pt states she feels generally achey today likely from doing too much the previous day. She states went swimming, walking, and HEP 2x.   PAIN:  Are you having pain? No VAS scale: 3/10 Pain location: R hip and SIJ Pain orientation: Right  PAIN TYPE: dull Pain description: constant  Aggravating factors: stairs, sitting too long, inclines Relieving factors: walking, movement, ice, NSAIDs    PLOF: Independent   PATIENT GOALS : She states she wants to build up her strength into her LE.       OBJECTIVE: DIAGNOSTIC FINDINGS:    L1-L2: Broad-based disc bulge flattening the ventral thecal sac. Moderate bilateral facet arthropathy. Moderate bilateral foraminal stenosis. Mild spinal stenosis.   L2-L3: Broad-based disc bulge. Moderate bilateral facet arthropathy. Moderate spinal stenosis. Moderate-severe bilateral foraminal stenosis.   L3-L4: Mild broad-based disc bulge. Severe bilateral facet arthropathy. Severe spinal stenosis. Bilateral subarticular recess stenosis. Moderate-severe right foraminal stenosis. No left foraminal stenosis.   L4-L5: Broad-based disc bulge. Moderate bilateral facet arthropathy. Bilateral lateral recess  narrowing. Moderate right foraminal stenosis. Mild left foraminal stenosis.   L5-S1: Minimal broad-based disc bulge. Severe bilateral facet arthropathy. Mild left foraminal stenosis. No right foraminal stenosis.   IMPRESSION: 1. Diffuse lumbar spine spondylosis as described above. 2. No acute osseous injury of the lumbar spine.   IMPRESSION: 1. No acute osseous injury of the pelvis. 2. Mild tendinosis of the left gluteus minimus tendon insertion. 3. Moderate tendinosis of the left gluteus medius tendon insertion. 4. Severe tendinosis of the right gluteus medius tendon insertion with a partial thickness tear. 5. Moderate tendinosis of the right gluteus minimus tendon insertion. 6. Partial-thickness tear of the hamstring origins bilaterally.   PATIENT SURVEYS:  FOTO 55 62 at DC 3 pts MCII   TODAY'S TREATMENT:  11/30 TREATMENT    Supine Bridge with Resistance Band - 2x10 BTB at knees STS BTB at knees 2x10 8lb KB 8lb KB RDL(trialed but regressed to blue TB pull through) 2x10 Standing hip ABD BTB at ankles 2x10 Seated QL stretch 30s 3x (particular  emphasis on L QL)   STM: bilat lumbar paraspinals Joint mobs: L1-5 UPA and L CPA grade III,   11/22 TREATMENT    Supine Bridge with Resistance Band - 2x10 BTB at knees Neutral Lumbar Spine Curl Up - 2x10 (one hand support neck) Side Stepping with Resistance at Thighs - 2x laps 41f   Standing hip hike- foot elevated on box 2x10 each Hip hinge with BTB at knees to table 2x10 Seated QL stretch 30s 3x (particular emphasis on L QL)   STM: bilat lumbar paraspinals Joint mobs: L1-5 UPA and L CPA grade III, R ant innominate rotation MET with shotgun    11/16 TREATMENT    Supine Bridge with Resistance Band - 2x10 BTB at knees Neutral Lumbar Spine Curl Up - 2x10 (one hand support neck) Side Stepping with Resistance at Thighs - 2x laps 237f  Standing hip hike- foot elevated on box 2x10 each Hip hinge with BTB at knees to table  2x10  STM: bilat lumbar paraspinals Joint mobs: L1-5 UPA and L CPA grade III, R ant innominate rotation MET with shotgun x2 (improved after second)     PATIENT EDUCATION:  Education details: MOI, diagnosis, prognosis, anatomy, exercise progression, DOMS expectations, muscle firing,  envelope of function, HEP, POC   Person educated: Patient Education method: Explanation, Demonstration, Tactile cues, Verbal cues, and Handouts Education comprehension: verbalized understanding, returned demonstration, verbal cues required, and tactile cues required     HOME EXERCISE PROGRAM: Access Code: [email or web address] URL: [email or web address] Date: 10/16/2021 Prepared by: AlDaleen Bo   ASSESSMENT:   CLINICAL IMPRESSION: Pt able to progress lumbopelvic stability exercise at today's session without pain. However, pt continues to have difficulty with loaded hip hinging motion as well as BW loaded hip ABD and ER. Pt with good tolerance to exercise at today's session and had report of 0/10 pain. Pt unable to perform RDL today without significant L/S compensation, so banded exercise kept for home at this time. Pt to perform self stretching of glute, quad, and HS in order to reduce stress along lateral hip and GT region. Plan to revisit RDL at next session.  Objective impairments include Abnormal gait, decreased activity tolerance, decreased endurance, decreased mobility, difficulty walking, decreased ROM, decreased strength, hypomobility, increased muscle spasms, impaired flexibility, improper body mechanics, postural dysfunction, obesity, and pain. These impairments are limiting patient from cleaning, community activity, yard work, shopping, and exercise/weight loss . Personal factors including Age, Fitness, Time since onset of injury/illness/exacerbation, and 1-2 comorbidities:    are also affecting patient's functional outcome. Patient will benefit from skilled PT to address above impairments and  improve overall function.   REHAB POTENTIAL: Good   CLINICAL DECISION MAKING: Stable/uncomplicated   EVALUATION COMPLEXITY: Low     GOALS:     SHORT TERM GOALS:   STG Name Target Date Goal status  1 Pt will become independent with HEP in order to demonstrate synthesis of PT education.   10/24/2021 INITIAL  2 Pt will be able to demonstrate step up/down without pain and controlled eccentric in order to demonstrate functional improvement in LE function for self-care and community navigation.   11/07/2021 INITIAL  3 Pt will score at least 3 pt increase on FOTO to demonstrate functional improvement in MCII and pt perceived function.    11/07/2021 INITIAL    LONG TERM GOALS:    LTG Name Target Date Goal status  1 Pt  will become independent with final HEP in order to demonstrate synthesis  of PT education.   12/05/2021 INITIAL  2 Pt will be able to lift/squat/hold >25 lbs in order to demonstrate functional improvement in lumbopelvic strength for safe return to exercise.    12/05/2021 INITIAL  3 Pt will be able to demonstrate/report ability to walk >30 mins without pain in order to demonstrate functional improvement and tolerance to exercise and community mobility.   12/05/2021 INITIAL  4 Pt will score >/= 62 on FOTO to demonstrate functional improvement in LBP and hip pain.   12/05/2021 INITIAL    PLAN: PT FREQUENCY: 1-2x/week   PT DURATION: 8 weeks   PLANNED INTERVENTIONS: Therapeutic exercises, Therapeutic activity, Neuro Muscular re-education, Balance training, Gait training, Patient/Family education, Joint mobilization, Stair training, Aquatic Therapy, Dry Needling, Electrical stimulation, Spinal mobilization, Cryotherapy, Moist heat, scar mobilization, Taping, Vasopneumatic device, Traction, Ultrasound, Ionotophoresis 80m/ml Dexamethasone, and Manual therapy   PLAN FOR NEXT SESSION: STM/ joint mobs to L/S and R hip, review HEP, review squatting and lifting technique   ADaleen BoPT,  DPT 10/30/21 5:36 PM

## 2021-11-04 ENCOUNTER — Telehealth: Payer: Self-pay

## 2021-11-04 ENCOUNTER — Ambulatory Visit (INDEPENDENT_AMBULATORY_CARE_PROVIDER_SITE_OTHER): Payer: Medicare Other | Admitting: Family Medicine

## 2021-11-04 NOTE — Telephone Encounter (Signed)
-  Caller states that her husband tested positive for covid. Reports that she is not having any symptoms, but is concerned about exposure and treatment options. Reports that she tested negative.  11/03/2021 2:57:50 PM Call PCP within 24 Hours Velta Addison, RN, Helene Kelp  11/04/21 at 1247: Pt states she has a "scratchy throat", but denies new cough, congestion, fever or SOB. Pt states she tested neg x2 (yesterday & today) with a home test. Isolation guidelines discussed, pt advised to cancel [facility] visit scheduled for 11/06/21. Pt asked about prophylactic medication. Advised pt to got to pharmacy for a rapid test as she is having symptoms & testing neg. Pt will keep [name] updated. Pt advised that if her symptoms worsen or if she would like antiviral to call office for appt.

## 2021-11-06 ENCOUNTER — Telehealth (INDEPENDENT_AMBULATORY_CARE_PROVIDER_SITE_OTHER): Payer: Medicare Other | Admitting: Family Medicine

## 2021-11-06 ENCOUNTER — Ambulatory Visit (INDEPENDENT_AMBULATORY_CARE_PROVIDER_SITE_OTHER): Payer: Medicare Other | Admitting: Family Medicine

## 2021-11-06 ENCOUNTER — Ambulatory Visit (HOSPITAL_BASED_OUTPATIENT_CLINIC_OR_DEPARTMENT_OTHER): Payer: Medicare Other | Admitting: Physical Therapy

## 2021-11-06 ENCOUNTER — Encounter: Payer: Self-pay | Admitting: Family Medicine

## 2021-11-06 ENCOUNTER — Encounter (HOSPITAL_BASED_OUTPATIENT_CLINIC_OR_DEPARTMENT_OTHER): Payer: Self-pay

## 2021-11-06 VITALS — BP 116/66 | Ht 59.0 in | Wt 213.0 lb

## 2021-11-06 DIAGNOSIS — I2584 Coronary atherosclerosis due to calcified coronary lesion: Secondary | ICD-10-CM

## 2021-11-06 DIAGNOSIS — I2510 Atherosclerotic heart disease of native coronary artery without angina pectoris: Secondary | ICD-10-CM | POA: Diagnosis not present

## 2021-11-06 DIAGNOSIS — U071 COVID-19: Secondary | ICD-10-CM | POA: Diagnosis not present

## 2021-11-06 MED ORDER — MOLNUPIRAVIR EUA 200MG CAPSULE: 4.0000 | ORAL_CAPSULE | Freq: Two times a day (BID) | ORAL | 0 refills | Status: AC

## 2021-11-06 NOTE — Progress Notes (Signed)
Patient ID: Nichole Smith, female   DOB: [birth year], 65 y.o.   MRN: [id number]   This visit type was conducted due to national recommendations for restrictions regarding the COVID-19 pandemic in an effort to limit this patient's exposure and mitigate transmission in our community.   Virtual Visit via Video Note  I connected with Nichole Smith on 11/06/21 at  3:00 PM EST by a video enabled telemedicine application and verified that I am speaking with the correct person using two identifiers.  Location patient: home Location provider:work or home office Persons participating in the virtual visit: patient, provider  I discussed the limitations of evaluation and management by telemedicine and the availability of in person appointments. The patient expressed understanding and agreed to proceed.   HPI: Nichole Smith called with symptoms of cough, nasal congestion, headaches, malaise.  Onset Monday.  Her husband was diagnosed with COVID over the weekend.  She did home test both Monday and Tuesday which were negative but she had a positive this morning.  She denies any dyspnea.  Denies any nausea, vomiting, or diarrhea.  She does have comorbidities including hypertension, obesity, obstructive sleep apnea, hypothyroidism.  She has been fully vaccinated for COVID   ROS: See pertinent positives and negatives per HPI.  Past Medical History:  Diagnosis Date   Arthritis    knees, back   Bursitis    hips - almost gone per pt    Cancer ([place])    Carcinoid tumor of lung 2012   right found incidentally on chest xray eval for r/o vasxculitis    Carpal tunnel syndrome, bilateral    bilat but surgery to correct    Coronary artery calcification 07/23/2016   Double vision    history, no current problem   Dysrhythmia    irregular occassional, no current problem   Frequent UTI    HNP (herniated nucleus pulposus), cervical 01/22/2011   C6-C7  Has seen dr Vertell Limber for this and Dr Tonita Cong  Positional numbness in hands  no weakness     Hypertension    Hypothyroidism    Infertility, female    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  Download 08/2014:  Good compliance, breakthru apnea on 10cm.  Changed to auto 5-12cm >> good control of AHI on f/u download ONO on CPAP:       Osteoarthritis    PCOS (polycystic ovarian syndrome)    Sleep apnea    wears cpap    SUI (stress urinary incontinence, female)    h/o    Past Surgical History:  Procedure Laterality Date   ANTERIOR CERVICAL DECOMP/DISCECTOMY FUSION  12/24/2017   Procedure: Cervical five-six Cervical six-seven Anterior cervical decompression/discectomy/fusion;  Surgeon: Erline Levine, MD;  Location: [place];  Service: Neurosurgery;;   CARPAL TUNNEL RELEASE Left 12/24/2017   Procedure: LEFT CARPAL TUNNEL RELEASE;  Surgeon: Erline Levine, MD;  Location: [place];  Service: Neurosurgery;  Laterality: Left;   CESAREAN SECTION     x 1 - twins   COLONOSCOPY     DILATATION & CURRETTAGE/HYSTEROSCOPY WITH RESECTOCOPE N/A 07/18/2013   Procedure: [place];  Surgeon: Lyman Speller, MD;  Location: [facility];  Service: Gynecology;  Laterality: N/A;  mass resection   implantable loop recorder placement  05/20/2021   Medtronic Reveal Linq model [id number] implantable loop recorder (SN [id number])   Lung tumor removed     Rt lung carcinoid  MOUTH SURGERY     pre-cancerous ulcer removed   POLYPECTOMY     SHOULDER OPEN ROTATOR CUFF REPAIR  07/20/2010   Right   WISDOM TOOTH EXTRACTION      Family History  Problem Relation Age of Onset   Stroke Mother        died age 61   Hypertension Mother    Sudden death Mother    Alcoholism Mother    Coronary artery disease Father        died age 47   Hypertension Father    Liver disease Father    Alcohol abuse Father    Obesity Father    Parkinson's disease Brother    Diabetes Brother     Hypertension Brother    Hypertension Maternal Uncle    Colon cancer Neg Hx    Rectal cancer Neg Hx    Stomach cancer Neg Hx    Colon polyps Neg Hx    Esophageal cancer Neg Hx     SOCIAL HX: Non-smoker   Current Outpatient Medications:    aspirin EC 81 MG tablet, Take 81 mg by mouth at bedtime., Disp: , Rfl:    atorvastatin (LIPITOR) 40 MG tablet, TAKE 1 TABLET DAILY, Disp: 90 tablet, Rfl: 3   augmented betamethasone dipropionate (DIPROLENE-AF) 0.05 % ointment, USE AS DIRECTED. DO NOT USE MORE THAN 5 DAYS IN A ROW OR MORE THAN 7 DAYS EACH MONTH., Disp: 45 g, Rfl: 0   CALCIUM PO, Take 1 tablet by mouth at bedtime. , Disp: , Rfl:    chlorthalidone (HYGROTON) 25 MG tablet, Take 1 tablet (25 mg total) by mouth daily., Disp: 90 tablet, Rfl: 3   diltiazem (CARDIZEM CD) 180 MG 24 hr capsule, Take 1 capsule (180 mg total) by mouth daily., Disp: 90 capsule, Rfl: 3   diltiazem (CARDIZEM) 30 MG tablet, 1 tablet every 6 hours for palpitations, Disp: 60 tablet, Rfl: 3   Dulaglutide (TRULICITY) 3 QP/5.9FM SOPN, Inject 3 mg as directed once a week., Disp: 6 mL, Rfl: 0   estradiol (ESTRACE VAGINAL) 0.1 MG/GM vaginal cream, INSERT 1 GRAM VAGINALLY TWICE WEEKLY, Disp: 42.5 g, Rfl: 3   GEMTESA 75 MG TABS, Take 1 tablet by mouth daily as needed., Disp: , Rfl:    metoprolol succinate (TOPROL-XL) 50 MG 24 hr tablet, Take 1 tablet (50 mg total) by mouth daily., Disp: 90 tablet, Rfl: 3   molnupiravir EUA (LAGEVRIO) 200 mg CAPS capsule, Take 4 capsules (800 mg total) by mouth 2 (two) times daily for 5 days., Disp: 40 capsule, Rfl: 0   Multiple Vitamins-Minerals (PRESERVISION AREDS 2 PO), Take 1 tablet by mouth 2 (two) times daily., Disp: , Rfl:    naproxen sodium (ALEVE) 220 MG tablet, Take 220 mg by mouth 2 (two) times daily as needed (for pain.)., Disp: , Rfl:    nitrofurantoin, macrocrystal-monohydrate, (MACROBID) 100 MG capsule, Take 1 capsule (100 mg total) by mouth daily., Disp: 90 capsule, Rfl: 3    sulfamethoxazole-trimethoprim (BACTRIM DS) 800-160 MG tablet, Take 1 tablet by mouth 2 (two) times daily., Disp: 6 tablet, Rfl: 2   SYNTHROID 137 MCG tablet, TAKE 1 TABLET DAILY (NEED TO SCHEDULE AN APPOINTMENT FOR MORE REFILLS), Disp: 90 tablet, Rfl: 3  EXAM:  VITALS per patient if applicable:  GENERAL: alert, oriented, appears well and in no acute distress  HEENT: atraumatic, conjunttiva clear, no obvious abnormalities on inspection of external nose and ears  NECK: normal movements of the head and neck  LUNGS:  on inspection no signs of respiratory distress, breathing rate appears normal, no obvious gross SOB, gasping or wheezing  CV: no obvious cyanosis  MS: moves all visible extremities without noticeable abnormality  PSYCH/NEURO: pleasant and cooperative, no obvious depression or anxiety, speech and thought processing grossly intact  ASSESSMENT AND PLAN:  Discussed the following assessment and plan:  COVID-19 infection.  Patient has multiple comorbidities as above.  We discussed antiviral therapy and have elected to start Molnupiravir 4 capsules by mouth twice daily for 5 days Follow-up promptly for any increased dyspnea or other concerns     I discussed the assessment and treatment plan with the patient. The patient was provided an opportunity to ask questions and all were answered. The patient agreed with the plan and demonstrated an understanding of the instructions.   The patient was advised to call back or seek an in-person evaluation if the symptoms worsen or if the condition fails to improve as anticipated.     Carolann Littler, MD

## 2021-11-07 ENCOUNTER — Other Ambulatory Visit: Payer: Self-pay | Admitting: *Deleted

## 2021-11-07 MED ORDER — CHLORTHALIDONE 25 MG PO TABS: 25.0000 mg | ORAL_TABLET | Freq: Every day | ORAL | 0 refills | Status: DC

## 2021-11-11 ENCOUNTER — Ambulatory Visit (INDEPENDENT_AMBULATORY_CARE_PROVIDER_SITE_OTHER): Payer: Medicare Other

## 2021-11-11 DIAGNOSIS — I480 Paroxysmal atrial fibrillation: Secondary | ICD-10-CM

## 2021-11-12 LAB — CUP PACEART REMOTE DEVICE CHECK
Date Time Interrogation Session: 20221202135531
Implantable Pulse Generator Implant Date: 20220620

## 2021-11-14 ENCOUNTER — Other Ambulatory Visit: Payer: Self-pay

## 2021-11-14 ENCOUNTER — Ambulatory Visit (INDEPENDENT_AMBULATORY_CARE_PROVIDER_SITE_OTHER): Payer: Medicare Other | Admitting: Family Medicine

## 2021-11-14 ENCOUNTER — Encounter (INDEPENDENT_AMBULATORY_CARE_PROVIDER_SITE_OTHER): Payer: Self-pay | Admitting: Family Medicine

## 2021-11-14 VITALS — BP 137/77 | HR 62 | Temp 98.2°F | Ht 59.0 in | Wt 211.0 lb

## 2021-11-14 DIAGNOSIS — R7303 Prediabetes: Secondary | ICD-10-CM | POA: Diagnosis not present

## 2021-11-14 DIAGNOSIS — Z6841 Body Mass Index (BMI) 40.0 and over, adult: Secondary | ICD-10-CM

## 2021-11-14 MED ORDER — TRULICITY 3 MG/0.5ML ~~LOC~~ SOAJ: 3.0000 mg | SUBCUTANEOUS | 1 refills | Status: DC

## 2021-11-14 NOTE — Progress Notes (Signed)
Chief Complaint:   OBESITY Nichole Smith is here to discuss her progress with her obesity treatment plan along with follow-up of her obesity related diagnoses. Nichole Smith is on keeping a food journal and adhering to recommended goals of 1200-1500 calories and 85+ grams of protein daily and states she is following her eating plan approximately 95% of the time. Nichole Smith states she is walking/swimming for 35 minutes 7 times per week.  Today's visit was #: 78 Starting weight: 238 lbs Starting date: 01/12/2019 Today's weight: 211 lbs Today's date: 11/14/2021 Total lbs lost to date: 27 Total lbs lost since last in-office visit: 2  Interim History: Nichole Smith has been journaling and her average calorie intake is approximately 1400. She is working on increasing protein and she is exercising regularly. She is getting a bit bored with her choices and she would like more food option ideas.  Subjective:   1. Pre-diabetes Nichole Smith's pre-diabetes is well controlled on Trulicity, and with diet and exercise. Her last labs were within normal limits. No side effects were noted.  Assessment/Plan:   1. Pre-diabetes We will refill Trulicity for 90 days with 1 refill. [name] will continue to work on weight loss, exercise, and decreasing simple carbohydrates to help decrease the risk of diabetes.   - Dulaglutide (TRULICITY) 3 ON/6.2XB SOPN; Inject 3 mg as directed once a week.  Dispense: 6 mL; Refill: 1  2. Obesity BMI today is 20 Nichole Smith is currently in the action stage of change. As such, her goal is to continue with weight loss efforts. She has agreed to keeping a food journal and adhering to recommended goals of 1200-1500 calories and 95+ grams of protein daily.   Nichole Smith is to see Dr. Jearld Shines at her next visit, as she has saw different food ideas that the patient will likely enjoy.  Exercise goals: As is.  Behavioral modification strategies: increasing lean protein intake and meal planning and cooking strategies.  Nichole Smith  has agreed to follow-up with our clinic in 6 weeks with Dr. Jearld Shines. She was informed of the importance of frequent follow-up visits to maximize her success with intensive lifestyle modifications for her multiple health conditions.   Objective:   Blood pressure 137/77, pulse 62, temperature 98.2 F (36.8 C), height 4\' 11"  (1.499 m), weight 211 lb (95.7 kg), last menstrual period 02/12/2011, SpO2 95 %. Body mass index is 42.62 kg/m.  General: Cooperative, alert, well developed, in no acute distress. HEENT: Conjunctivae and lids unremarkable. Cardiovascular: Regular rhythm.  Lungs: Normal work of breathing. Neurologic: No focal deficits.   Lab Results  Component Value Date   CREATININE 0.67 10/03/2021   BUN 25 10/03/2021   NA 141 10/03/2021   K 4.0 10/03/2021   CL 100 10/03/2021   CO2 28 10/03/2021   Lab Results  Component Value Date   ALT 32 10/03/2021   AST 26 10/03/2021   ALKPHOS 73 10/03/2021   BILITOT 0.5 10/03/2021   Lab Results  Component Value Date   HGBA1C 5.5 10/03/2021   HGBA1C 5.3 06/25/2021   HGBA1C 5.4 06/27/2020   HGBA1C 5.5 01/02/2020   HGBA1C 5.6 08/22/2019   Lab Results  Component Value Date   INSULIN 13.6 10/03/2021   INSULIN 12.2 06/25/2021   INSULIN 13.7 06/27/2020   INSULIN 13.5 01/02/2020   INSULIN 21.5 08/22/2019   Lab Results  Component Value Date   TSH 3.070 10/03/2021   Lab Results  Component Value Date   CHOL 148 10/03/2021   HDL 58 10/03/2021  LDLCALC 76 10/03/2021   TRIG 71 10/03/2021   CHOLHDL 2 07/30/2021   Lab Results  Component Value Date   VD25OH 62.5 10/03/2021   VD25OH 53.1 06/25/2021   VD25OH 51.9 06/27/2020   Lab Results  Component Value Date   WBC 8.7 07/30/2021   HGB 15.3 (H) 07/30/2021   HCT 45.2 07/30/2021   MCV 93.9 07/30/2021   PLT 239.0 07/30/2021   No results found for: IRON, TIBC, FERRITIN  Attestation Statements:   Reviewed by clinician on day of visit: allergies, medications, problem list,  medical history, surgical history, family history, social history, and previous encounter notes.  Time spent on visit including pre-visit chart review and post-visit care and charting was 30 minutes.    I, Trixie Dredge, am acting as transcriptionist for Dennard Nip, MD.  I have reviewed the above documentation for accuracy and completeness, and I agree with the above. -  Dennard Nip, MD

## 2021-11-19 ENCOUNTER — Other Ambulatory Visit: Payer: Self-pay

## 2021-11-19 ENCOUNTER — Ambulatory Visit (HOSPITAL_BASED_OUTPATIENT_CLINIC_OR_DEPARTMENT_OTHER): Payer: Medicare Other | Attending: Family Medicine | Admitting: Physical Therapy

## 2021-11-19 ENCOUNTER — Encounter (HOSPITAL_BASED_OUTPATIENT_CLINIC_OR_DEPARTMENT_OTHER): Payer: Self-pay | Admitting: Physical Therapy

## 2021-11-19 DIAGNOSIS — M25551 Pain in right hip: Secondary | ICD-10-CM

## 2021-11-19 DIAGNOSIS — R262 Difficulty in walking, not elsewhere classified: Secondary | ICD-10-CM

## 2021-11-19 DIAGNOSIS — M6281 Muscle weakness (generalized): Secondary | ICD-10-CM

## 2021-11-19 DIAGNOSIS — M545 Low back pain, unspecified: Secondary | ICD-10-CM

## 2021-11-19 NOTE — Therapy (Signed)
OUTPATIENT PHYSICAL THERAPY TREATMENT NOTE   Patient Name: Nichole Smith MRN: [id number] DOB:[birth year], 65 y.o., female Today's Date: 11/19/2021  PCP: Burnis Medin, MD REFERRING PROVIDER: Burnis Medin, MD   PT End of Session - 11/19/21 0935     Visit Number 5    Number of Visits 17    Date for PT Re-Evaluation 01/08/22    Authorization Type Medicare    PT Start Time 0930    PT Stop Time 1010    PT Time Calculation (min) 40 min    Activity Tolerance Patient tolerated treatment well    Behavior During Therapy [facility] for tasks assessed/performed                Past Medical History:  Diagnosis Date   Arthritis    knees, back   Bursitis    hips - almost gone per pt    Cancer ([place])    Carcinoid tumor of lung 2012   right found incidentally on chest xray eval for r/o vasxculitis    Carpal tunnel syndrome, bilateral    bilat but surgery to correct    Coronary artery calcification 07/23/2016   Double vision    history, no current problem   Dysrhythmia    irregular occassional, no current problem   Frequent UTI    HNP (herniated nucleus pulposus), cervical 01/22/2011   C6-C7  Has seen dr Vertell Limber for this and Dr Tonita Cong  Positional numbness in hands no weakness     Hypertension    Hypothyroidism    Infertility, female    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  Download 08/2014:  Good compliance, breakthru apnea on 10cm.  Changed to auto 5-12cm >> good control of AHI on f/u download ONO on CPAP:       Osteoarthritis    PCOS (polycystic ovarian syndrome)    Sleep apnea    wears cpap    SUI (stress urinary incontinence, female)    h/o   Past Surgical History:  Procedure Laterality Date   ANTERIOR CERVICAL DECOMP/DISCECTOMY FUSION  12/24/2017   Procedure: Cervical five-six Cervical six-seven Anterior cervical decompression/discectomy/fusion;  Surgeon: Erline Levine, MD;   Location: [place];  Service: Neurosurgery;;   CARPAL TUNNEL RELEASE Left 12/24/2017   Procedure: LEFT CARPAL TUNNEL RELEASE;  Surgeon: Erline Levine, MD;  Location: [place];  Service: Neurosurgery;  Laterality: Left;   CESAREAN SECTION     x 1 - twins   COLONOSCOPY     DILATATION & CURRETTAGE/HYSTEROSCOPY WITH RESECTOCOPE N/A 07/18/2013   Procedure: [place];  Surgeon: Lyman Speller, MD;  Location: [place];  Service: Gynecology;  Laterality: N/A;  mass resection   implantable loop recorder placement  05/20/2021   Medtronic Reveal Linq model LNQ22 implantable loop recorder (SN [id number])   Lung tumor removed     Rt lung carcinoid   MOUTH SURGERY     pre-cancerous ulcer removed   POLYPECTOMY     SHOULDER OPEN ROTATOR CUFF REPAIR  07/20/2010   Right   WISDOM TOOTH EXTRACTION     Patient Active Problem List   Diagnosis Date Noted   Gluteal tendonitis of right buttock 06/19/2021   Greater trochanteric bursitis of right hip 05/08/2021   Chronic idiopathic constipation 05/01/2021   Lumbar radiculopathy 04/04/2021   Healthcare maintenance 12/25/2020   Insulin resistance 03/05/2020   Vitamin D deficiency 08/25/2019  Prediabetes 08/25/2019   Acute pain of right knee 13/07/6577   Lichen planus 46/96/2952   Cervical stenosis of spinal canal 12/24/2017   Hyperlipidemia LDL goal <70 08/26/2017   Heart palpitations 08/26/2017   Hammertoe of right foot 01/06/2017   Coronary artery calcification 07/23/2016   Periodic limb movements of sleep 06/18/2016   Atherosclerotic plaque 06/11/2016   Piriformis syndrome of left side 10/24/2015   Loss of transverse plantar arch of left foot 09/12/2015   Thyroiditis, autoimmune 02/26/2015   Obstructive sleep apnea 07/27/2014   Midline low back pain without sciatica 06/20/2014   Polycythemia, secondary 03/22/2014   Severe obesity (BMI >= 40) ([place]) 02/22/2014   Elevated hemoglobin ([place]) 02/22/2014   Left  shoulder pain 02/04/2013   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   HNP (herniated nucleus pulposus), cervical 01/22/2011   Visit for preventive health examination 01/22/2011   PAIN IN JOINT, MULTIPLE SITES 01/21/2010   Hypothyroidism 10/16/2008   Class 3 severe obesity with serious comorbidity and body mass index (BMI) of 40.0 to 44.9 in adult [facility]) 01/07/2008   PALPITATIONS, RECURRENT 01/07/2008   Essential hypertension 06/25/2007   OSTEOARTHRITIS 06/25/2007   REFERRING PROVIDER: Lyndal Pulley, DO   REFERRING DIAG: [phone] (ICD-10-CM) - Lumbar radiculopathy  THERAPY DIAG:  Pain, lumbar region  Difficulty walking  Pain in right hip  Muscle weakness (generalized)  PERTINENT HISTORY: Pt reports osteoporosis was diagnosed by Dr. Sabra Heck.    SUBJECTIVE: Pt states that the back pain has been pretty good. She recently had COVID and feels back to normal from an illness perspective. Pt states she has had improvement in her back pain. Pt states sitting too much is still the trigger.   PAIN:  Are you having pain? No VAS scale: 0/10 Pain location: R hip and SIJ Pain orientation: Right  PAIN TYPE: dull Pain description: constant  Aggravating factors: stairs, sitting too long, inclines Relieving factors: walking, movement, ice, NSAIDs    PLOF: Independent   PATIENT GOALS : She states she wants to build up her strength into her LE.       OBJECTIVE: DIAGNOSTIC FINDINGS:    L1-L2: Broad-based disc bulge flattening the ventral thecal sac. Moderate bilateral facet arthropathy. Moderate bilateral foraminal stenosis. Mild spinal stenosis.   L2-L3: Broad-based disc bulge. Moderate bilateral facet arthropathy. Moderate spinal stenosis. Moderate-severe bilateral foraminal stenosis.   L3-L4: Mild broad-based disc bulge. Severe bilateral facet arthropathy. Severe spinal stenosis. Bilateral subarticular recess stenosis. Moderate-severe right foraminal stenosis. No left foraminal  stenosis.   L4-L5: Broad-based disc bulge. Moderate bilateral facet arthropathy. Bilateral lateral recess narrowing. Moderate right foraminal stenosis. Mild left foraminal stenosis.   L5-S1: Minimal broad-based disc bulge. Severe bilateral facet arthropathy. Mild left foraminal stenosis. No right foraminal stenosis.   IMPRESSION: 1. Diffuse lumbar spine spondylosis as described above. 2. No acute osseous injury of the lumbar spine.   IMPRESSION: 1. No acute osseous injury of the pelvis. 2. Mild tendinosis of the left gluteus minimus tendon insertion. 3. Moderate tendinosis of the left gluteus medius tendon insertion. 4. Severe tendinosis of the right gluteus medius tendon insertion with a partial thickness tear. 5. Moderate tendinosis of the right gluteus minimus tendon insertion. 6. Partial-thickness tear of the hamstring origins bilaterally.   PATIENT SURVEYS:  FOTO 55 62 at DC 3 pts MCII   FOTO 12/20 57 pts   TODAY'S TREATMENT:  12/20 TREATMENT   Farmer's carry 15lb KB offset 87f x4 Double UE farmer's carry [id number] STS with 10lb  2x10 off table RDL 10lb KB 3x10 cuing for hip hinge, trunk position, and bracing   11/30 TREATMENT    Supine Bridge with Resistance Band - 2x10 BTB at knees STS BTB at knees 2x10 8lb KB 8lb KB RDL(trialed but regressed to blue TB pull through) 2x10 Standing hip ABD BTB at ankles 2x10 Seated QL stretch 30s 3x (particular emphasis on L QL)   STM: bilat lumbar paraspinals Joint mobs: L1-5 UPA and L CPA grade III,  11/30 TREATMENT    Supine Bridge with Resistance Band - 2x10 BTB at knees STS BTB at knees 2x10 8lb KB 8lb KB RDL(trialed but regressed to blue TB pull through) 2x10 Standing hip ABD BTB at ankles 2x10 Seated QL stretch 30s 3x (particular emphasis on L QL)   STM: bilat lumbar paraspinals Joint mobs: L1-5 UPA and L CPA grade III,   11/22 TREATMENT    Supine Bridge with Resistance Band - 2x10 BTB at knees Neutral  Lumbar Spine Curl Up - 2x10 (one hand support neck) Side Stepping with Resistance at Thighs - 2x laps 55f   Standing hip hike- foot elevated on box 2x10 each Hip hinge with BTB at knees to table 2x10 Seated QL stretch 30s 3x (particular emphasis on L QL)   STM: bilat lumbar paraspinals Joint mobs: L1-5 UPA and L CPA grade III, R ant innominate rotation MET with shotgun       PATIENT EDUCATION:  Education details: lifting form technique, anatomy, exercise progression, DOMS expectations, muscle firing,  envelope of function, HEP, POC   Person educated: Patient Education method: Explanation, Demonstration, Tactile cues, Verbal cues, and Handouts Education comprehension: verbalized understanding, returned demonstration, verbal cues required, and tactile cues required     HOME EXERCISE PROGRAM: Access Code: [email or web address] URL: [email or web address] Date: 10/16/2021 Prepared by: ADaleen Bo    ASSESSMENT:   CLINICAL IMPRESSION: Pt with no pain or discomfort today with progression of lifting exercise. Pt able to perform RDL with no pain or discomfort and able to incorporate longer duration holds with loaded functional lifting tasks. Pt progressing well with therapy despite minimal change in FOTO score. Pt reports noticing improvement but has increasing goals with each stage of her progression. Pt is able to perform lifting with no pain and shows good understanding for performance at home. Plan to continue with strength progression for home exercise.  HEP updated Trial Paloff next session.   Objective impairments include Abnormal gait, decreased activity tolerance, decreased endurance, decreased mobility, difficulty walking, decreased ROM, decreased strength, hypomobility, increased muscle spasms, impaired flexibility, improper body mechanics, postural dysfunction, obesity, and pain. These impairments are limiting patient from cleaning, community activity, yard work, shopping, and  exercise/weight loss . Personal factors including Age, Fitness, Time since onset of injury/illness/exacerbation, and 1-2 comorbidities:    are also affecting patient's functional outcome. Patient will benefit from skilled PT to address above impairments and improve overall function.   REHAB POTENTIAL: Good   CLINICAL DECISION MAKING: Stable/uncomplicated   EVALUATION COMPLEXITY: Low     GOALS:     SHORT TERM GOALS:   STG Name Target Date Goal status  1 Pt will become independent with HEP in order to demonstrate synthesis of PT education.   10/24/2021 INITIAL  2 Pt will be able to demonstrate step up/down without pain and controlled eccentric in order to demonstrate functional improvement in LE function for self-care and community navigation.   11/07/2021 INITIAL  3 Pt will score at least 3 pt increase on  FOTO to demonstrate functional improvement in MCII and pt perceived function.    11/07/2021 INITIAL    LONG TERM GOALS:    LTG Name Target Date Goal status  1 Pt  will become independent with final HEP in order to demonstrate synthesis of PT education.   12/05/2021 INITIAL  2 Pt will be able to lift/squat/hold >25 lbs in order to demonstrate functional improvement in lumbopelvic strength for safe return to exercise.    12/05/2021 INITIAL  3 Pt will be able to demonstrate/report ability to walk >30 mins without pain in order to demonstrate functional improvement and tolerance to exercise and community mobility.   12/05/2021 INITIAL  4 Pt will score >/= 62 on FOTO to demonstrate functional improvement in LBP and hip pain.   12/05/2021 INITIAL    PLAN: PT FREQUENCY: 1-2x/week   PT DURATION: 8 weeks   PLANNED INTERVENTIONS: Therapeutic exercises, Therapeutic activity, Neuro Muscular re-education, Balance training, Gait training, Patient/Family education, Joint mobilization, Stair training, Aquatic Therapy, Dry Needling, Electrical stimulation, Spinal mobilization, Cryotherapy, Moist heat,  scar mobilization, Taping, Vasopneumatic device, Traction, Ultrasound, Ionotophoresis 59m/ml Dexamethasone, and Manual therapy   PLAN FOR NEXT SESSION: Progress gym based strengthening with bands and KB, pt has weight equipment at home   ADaleen BoPT, DPT 11/19/21 10:16 AM

## 2021-11-19 NOTE — Progress Notes (Signed)
[facility] Summary Report / Loop Recorder 

## 2021-11-20 ENCOUNTER — Telehealth: Payer: Medicare Other | Admitting: Internal Medicine

## 2021-11-20 ENCOUNTER — Other Ambulatory Visit (INDEPENDENT_AMBULATORY_CARE_PROVIDER_SITE_OTHER): Payer: Self-pay | Admitting: Family Medicine

## 2021-11-20 DIAGNOSIS — R7303 Prediabetes: Secondary | ICD-10-CM

## 2021-11-28 ENCOUNTER — Encounter (HOSPITAL_BASED_OUTPATIENT_CLINIC_OR_DEPARTMENT_OTHER): Payer: Medicare Other | Admitting: Physical Therapy

## 2021-12-04 ENCOUNTER — Encounter (HOSPITAL_BASED_OUTPATIENT_CLINIC_OR_DEPARTMENT_OTHER): Payer: Self-pay | Admitting: Physical Therapy

## 2021-12-04 ENCOUNTER — Ambulatory Visit (HOSPITAL_BASED_OUTPATIENT_CLINIC_OR_DEPARTMENT_OTHER): Payer: Medicare Other | Attending: Family Medicine | Admitting: Physical Therapy

## 2021-12-04 ENCOUNTER — Other Ambulatory Visit: Payer: Self-pay

## 2021-12-04 ENCOUNTER — Ambulatory Visit (INDEPENDENT_AMBULATORY_CARE_PROVIDER_SITE_OTHER): Payer: Medicare Other

## 2021-12-04 DIAGNOSIS — R262 Difficulty in walking, not elsewhere classified: Secondary | ICD-10-CM | POA: Insufficient documentation

## 2021-12-04 DIAGNOSIS — M810 Age-related osteoporosis without current pathological fracture: Secondary | ICD-10-CM

## 2021-12-04 DIAGNOSIS — M6281 Muscle weakness (generalized): Secondary | ICD-10-CM | POA: Diagnosis present

## 2021-12-04 DIAGNOSIS — M545 Low back pain, unspecified: Secondary | ICD-10-CM | POA: Insufficient documentation

## 2021-12-04 DIAGNOSIS — M25551 Pain in right hip: Secondary | ICD-10-CM | POA: Insufficient documentation

## 2021-12-04 MED ORDER — DENOSUMAB 60 MG/ML ~~LOC~~ SOSY
60.0000 mg | PREFILLED_SYRINGE | Freq: Once | SUBCUTANEOUS | Status: AC
Start: 2021-12-04 — End: 2021-12-04
  Administered 2021-12-04: 60 mg via SUBCUTANEOUS

## 2021-12-04 NOTE — Therapy (Signed)
OUTPATIENT PHYSICAL THERAPY TREATMENT NOTE   Patient Name: Nichole Smith MRN: [id number] DOB:07/26/1956, 66 y.o., female Today's Date: 12/04/2021  PCP: Burnis Medin, MD REFERRING PROVIDER: Burnis Medin, MD   PT End of Session - 12/04/21 1011     Visit Number 6    Number of Visits 17    Date for PT Re-Evaluation 01/08/22    Authorization Type Medicare    PT Start Time 0930    PT Stop Time 1010    PT Time Calculation (min) 40 min    Activity Tolerance Patient tolerated treatment well    Behavior During Therapy [facility] for tasks assessed/performed                 Past Medical History:  Diagnosis Date   Arthritis    knees, back   Bursitis    hips - almost gone per pt    Cancer ([place])    Carcinoid tumor of lung 2012   right found incidentally on chest xray eval for r/o vasxculitis    Carpal tunnel syndrome, bilateral    bilat but surgery to correct    Coronary artery calcification 07/23/2016   Double vision    history, no current problem   Dysrhythmia    irregular occassional, no current problem   Frequent UTI    HNP (herniated nucleus pulposus), cervical 01/22/2011   C6-C7  Has seen dr Vertell Limber for this and Dr Tonita Cong  Positional numbness in hands no weakness     Hypertension    Hypothyroidism    Infertility, female    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  Download 08/2014:  Good compliance, breakthru apnea on 10cm.  Changed to auto 5-12cm >> good control of AHI on f/u download ONO on CPAP:       Osteoarthritis    PCOS (polycystic ovarian syndrome)    Sleep apnea    wears cpap    SUI (stress urinary incontinence, female)    h/o   Past Surgical History:  Procedure Laterality Date   ANTERIOR CERVICAL DECOMP/DISCECTOMY FUSION  12/24/2017   Procedure: Cervical five-six Cervical six-seven Anterior cervical decompression/discectomy/fusion;  Surgeon: Erline Levine, MD;   Location: [place];  Service: Neurosurgery;;   CARPAL TUNNEL RELEASE Left 12/24/2017   Procedure: LEFT CARPAL TUNNEL RELEASE;  Surgeon: Erline Levine, MD;  Location: [place];  Service: Neurosurgery;  Laterality: Left;   CESAREAN SECTION     x 1 - twins   COLONOSCOPY     DILATATION & CURRETTAGE/HYSTEROSCOPY WITH RESECTOCOPE N/A 07/18/2013   Procedure: [place];  Surgeon: Lyman Speller, MD;  Location: [place] [facility];  Service: Gynecology;  Laterality: N/A;  mass resection   implantable loop recorder placement  05/20/2021   Medtronic Reveal Linq model LNQ22 implantable loop recorder (SN [id number])   Lung tumor removed     Rt lung carcinoid   MOUTH SURGERY     pre-cancerous ulcer removed   POLYPECTOMY     SHOULDER OPEN ROTATOR CUFF REPAIR  07/20/2010   Right   WISDOM TOOTH EXTRACTION     Patient Active Problem List   Diagnosis Date Noted   Gluteal tendonitis of right buttock 06/19/2021   Greater trochanteric bursitis of right hip 05/08/2021   Chronic idiopathic constipation 05/01/2021   Lumbar radiculopathy 04/04/2021   Healthcare maintenance 12/25/2020   Insulin resistance 03/05/2020   Vitamin D deficiency 08/25/2019  Prediabetes 08/25/2019   Acute pain of right knee 76/73/4193   Lichen planus 79/01/4096   Cervical stenosis of spinal canal 12/24/2017   Hyperlipidemia LDL goal <70 08/26/2017   Heart palpitations 08/26/2017   Hammertoe of right foot 01/06/2017   Coronary artery calcification 07/23/2016   Periodic limb movements of sleep 06/18/2016   Atherosclerotic plaque 06/11/2016   Piriformis syndrome of left side 10/24/2015   Loss of transverse plantar arch of left foot 09/12/2015   Thyroiditis, autoimmune 02/26/2015   Obstructive sleep apnea 07/27/2014   Midline low back pain without sciatica 06/20/2014   Polycythemia, secondary 03/22/2014   Severe obesity (BMI >= 40) ([place]) 02/22/2014   Elevated hemoglobin ([place]) 02/22/2014   Left  shoulder pain 02/04/2013   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   HNP (herniated nucleus pulposus), cervical 01/22/2011   Visit for preventive health examination 01/22/2011   PAIN IN JOINT, MULTIPLE SITES 01/21/2010   Hypothyroidism 10/16/2008   Class 3 severe obesity with serious comorbidity and body mass index (BMI) of 40.0 to 44.9 in adult [facility]) 01/07/2008   PALPITATIONS, RECURRENT 01/07/2008   Essential hypertension 06/25/2007   OSTEOARTHRITIS 06/25/2007   REFERRING PROVIDER: Lyndal Pulley, DO   REFERRING DIAG: [phone] (ICD-10-CM) - Lumbar radiculopathy  THERAPY DIAG:  Pain, lumbar region  Pain in right hip  Difficulty walking  Muscle weakness (generalized)  PERTINENT HISTORY: Pt reports osteoporosis was diagnosed by Dr. Sabra Heck.    SUBJECTIVE: Pt states that over the holiday she had some increased pain. She states the injection is probably wearing off. Sitting still hurts but she is starting to have decreased pain.   PAIN:  Are you having pain? Yes VAS scale: 8/10 Pain location: R hip and SIJ Pain orientation: Right  PAIN TYPE: dull Pain description: constant  Aggravating factors: stairs, sitting too long, inclines Relieving factors: walking, movement, ice, NSAIDs    PLOF: Independent   PATIENT GOALS : She states she wants to build up her strength into her LE.       OBJECTIVE: DIAGNOSTIC FINDINGS:    L1-L2: Broad-based disc bulge flattening the ventral thecal sac. Moderate bilateral facet arthropathy. Moderate bilateral foraminal stenosis. Mild spinal stenosis.   L2-L3: Broad-based disc bulge. Moderate bilateral facet arthropathy. Moderate spinal stenosis. Moderate-severe bilateral foraminal stenosis.   L3-L4: Mild broad-based disc bulge. Severe bilateral facet arthropathy. Severe spinal stenosis. Bilateral subarticular recess stenosis. Moderate-severe right foraminal stenosis. No left foraminal stenosis.   L4-L5: Broad-based disc bulge. Moderate  bilateral facet arthropathy. Bilateral lateral recess narrowing. Moderate right foraminal stenosis. Mild left foraminal stenosis.   L5-S1: Minimal broad-based disc bulge. Severe bilateral facet arthropathy. Mild left foraminal stenosis. No right foraminal stenosis.   IMPRESSION: 1. Diffuse lumbar spine spondylosis as described above. 2. No acute osseous injury of the lumbar spine.   IMPRESSION: 1. No acute osseous injury of the pelvis. 2. Mild tendinosis of the left gluteus minimus tendon insertion. 3. Moderate tendinosis of the left gluteus medius tendon insertion. 4. Severe tendinosis of the right gluteus medius tendon insertion with a partial thickness tear. 5. Moderate tendinosis of the right gluteus minimus tendon insertion. 6. Partial-thickness tear of the hamstring origins bilaterally.   PATIENT SURVEYS:  FOTO 55 62 at DC 3 pts MCII   FOTO 12/20 57 pts   TODAY'S TREATMENT:  12/04/21 TREATMENT    Prayer stretch 5s 10x L and R (standing at bar) Paloff press GTB 2x10 Seated flexion ball stretch 3 way 5s 5x each   STM: bilat  lumbar paraspinals Joint mobs: L1-5 UPA and L CPA grade III, Mulligan R hip inf and lateral grade IV  12/20 TREATMENT   Farmer's carry 15lb KB offset 55f x4 Double UE farmer's carry [id number] STS with 10lb 2x10 off table RDL 10lb KB 3x10 cuing for hip hinge, trunk position, and bracing   11/30 TREATMENT    Supine Bridge with Resistance Band - 2x10 BTB at knees STS BTB at knees 2x10 8lb KB 8lb KB RDL(trialed but regressed to blue TB pull through) 2x10 Standing hip ABD BTB at ankles 2x10 Seated QL stretch 30s 3x (particular emphasis on L QL)   STM: bilat lumbar paraspinals Joint mobs: L1-5 UPA and L CPA grade III,  11/30 TREATMENT    Supine Bridge with Resistance Band - 2x10 BTB at knees STS BTB at knees 2x10 8lb KB 8lb KB RDL(trialed but regressed to blue TB pull through) 2x10 Standing hip ABD BTB at ankles 2x10 Seated QL  stretch 30s 3x (particular emphasis on L QL)   STM: bilat lumbar paraspinals Joint mobs: L1-5 UPA and L CPA grade III,   11/22 TREATMENT    Supine Bridge with Resistance Band - 2x10 BTB at knees Neutral Lumbar Spine Curl Up - 2x10 (one hand support neck) Side Stepping with Resistance at Thighs - 2x laps 2577f Standing hip hike- foot elevated on box 2x10 each Hip hinge with BTB at knees to table 2x10 Seated QL stretch 30s 3x (particular emphasis on L QL)   STM: bilat lumbar paraspinals Joint mobs: L1-5 UPA and L CPA grade III, R ant innominate rotation MET with shotgun       PATIENT EDUCATION:  Education details: lifting form technique, anatomy, exercise progression, DOMS expectations, muscle firing,  envelope of function, HEP, POC   Person educated: Patient Education method: Explanation, Demonstration, Tactile cues, Verbal cues, and Handouts Education comprehension: verbalized understanding, returned demonstration, verbal cues required, and tactile cues required     HOME EXERCISE PROGRAM: Access Code: [email or web address] URL: [email or web address] Date: 10/16/2021 Prepared by: AlaDaleen Bo  ASSESSMENT:   CLINICAL IMPRESSION: Pt with increased pain today's session that is likely due more static positioning over the holiday break. Pt had report of 0/10 by end of session and significant relief of SIJ pain following R hip Mulligan mobilization. Pt able to introduce gentle anti rotational exercise without increased pain. Exercise was not progressed due to recent exacerbation in pain. Plan to continue with lumbar strength and possibly retry hip mobilization at next session.   Objective impairments include Abnormal gait, decreased activity tolerance, decreased endurance, decreased mobility, difficulty walking, decreased ROM, decreased strength, hypomobility, increased muscle spasms, impaired flexibility, improper body mechanics, postural dysfunction, obesity, and pain. These  impairments are limiting patient from cleaning, community activity, yard work, shopping, and exercise/weight loss . Personal factors including Age, Fitness, Time since onset of injury/illness/exacerbation, and 1-2 comorbidities:    are also affecting patient's functional outcome. Patient will benefit from skilled PT to address above impairments and improve overall function.   REHAB POTENTIAL: Good   CLINICAL DECISION MAKING: Stable/uncomplicated   EVALUATION COMPLEXITY: Low     GOALS:     SHORT TERM GOALS:   STG Name Target Date Goal status  1 Pt will become independent with HEP in order to demonstrate synthesis of PT education.   10/24/2021 INITIAL  2 Pt will be able to demonstrate step up/down without pain and controlled eccentric in order to demonstrate functional improvement in LE function  for self-care and community navigation.   11/07/2021 INITIAL  3 Pt will score at least 3 pt increase on FOTO to demonstrate functional improvement in MCII and pt perceived function.    11/07/2021 INITIAL    LONG TERM GOALS:    LTG Name Target Date Goal status  1 Pt  will become independent with final HEP in order to demonstrate synthesis of PT education.   12/05/2021 INITIAL  2 Pt will be able to lift/squat/hold >25 lbs in order to demonstrate functional improvement in lumbopelvic strength for safe return to exercise.    12/05/2021 INITIAL  3 Pt will be able to demonstrate/report ability to walk >30 mins without pain in order to demonstrate functional improvement and tolerance to exercise and community mobility.   12/05/2021 INITIAL  4 Pt will score >/= 62 on FOTO to demonstrate functional improvement in LBP and hip pain.   12/05/2021 INITIAL    PLAN: PT FREQUENCY: 1-2x/week   PT DURATION: 8 weeks   PLANNED INTERVENTIONS: Therapeutic exercises, Therapeutic activity, Neuro Muscular re-education, Balance training, Gait training, Patient/Family education, Joint mobilization, Stair training, Aquatic  Therapy, Dry Needling, Electrical stimulation, Spinal mobilization, Cryotherapy, Moist heat, scar mobilization, Taping, Vasopneumatic device, Traction, Ultrasound, Ionotophoresis 12m/ml Dexamethasone, and Manual therapy   PLAN FOR NEXT SESSION: Progress gym based strengthening with bands and KB, pt has weight equipment at home   ADaleen BoPT, DPT 12/04/21 10:17 AM

## 2021-12-04 NOTE — Progress Notes (Signed)
Patient came in today for Prolia 60mg  injection. Injection was given [place] in the back of her left upper arm. Patient tolerated the injection well. tbw

## 2021-12-06 NOTE — Progress Notes (Signed)
Nichole Smith [address] [place] Phone: [phone] Subjective:   Nichole Smith, am serving as a scribe for Dr. Hulan Saas. This visit occurred during the SARS-CoV-2 public health emergency.  Safety protocols were in place, including screening questions prior to the visit, additional usage of staff PPE, and extensive cleaning of exam room while observing appropriate contact time as indicated for disinfecting solutions.   I'm seeing this patient by the request  of:  Panosh, Standley Brooking, MD  CC: back   [id number]  10/01/2021 Patient continues to have discomfort and pain in this area.  MRI did show the patient did have significant amount of partial tearing noted of multiple different gluteal muscles.  I do think that this is secondary to more of the denervation that was occurring from the spinal stenosis.  May need to consider the possibility of PRP injection in this area.  We will continue to monitor and follow-up again in 2 to 3 months.  Get patient into formal physical therapy for this as well as the spinal stenosis of the lumbar spine.  Patient has known severe L3-L4 spinal stenosis.  Has now had 2 epidurals in the back in the last 4 months.  Patient states this epidural has been much more beneficial at the moment.  Having significant decrease in pain.  Patient would like to know what other steps may be necessary in the future.  Will refer patient to neurosurgery to discuss but likely at this point I do think patient will do well with conservative therapy.  Follow-up with me again in 2 to 3 months.  Update 12/10/2021 Nichole Smith is a 66 y.o. female coming in with complaint of R glute and lumbar spine pain. Referred to Dr. Reatha Armour. He said she is not good surgical candidate. Been in PT as well. Patient states that she is doing well. Is in PT and feels that this is helping. Was diagnosed with osteoporosis and started Prolia last week.        Past Medical History:  Diagnosis Date   Arthritis    knees, back   Bursitis    hips - almost gone per pt    Cancer ([place])    Carcinoid tumor of lung 2012   right found incidentally on chest xray eval for r/o vasxculitis    Carpal tunnel syndrome, bilateral    bilat but surgery to correct    Coronary artery calcification 07/23/2016   Double vision    history, no current problem   Dysrhythmia    irregular occassional, no current problem   Frequent UTI    HNP (herniated nucleus pulposus), cervical 01/22/2011   C6-C7  Has seen dr Vertell Limber for this and Dr Tonita Cong  Positional numbness in hands no weakness     Hypertension    Hypothyroidism    Infertility, female    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  Download 08/2014:  Good compliance, breakthru apnea on 10cm.  Changed to auto 5-12cm >> good control of AHI on f/u download ONO on CPAP:       Osteoarthritis    PCOS (polycystic ovarian syndrome)    Sleep apnea    wears cpap    SUI (stress urinary incontinence, female)    h/o   Past Surgical History:  Procedure Laterality Date   ANTERIOR CERVICAL DECOMP/DISCECTOMY FUSION  12/24/2017   Procedure: Cervical  five-six Cervical six-seven Anterior cervical decompression/discectomy/fusion;  Surgeon: Erline Levine, MD;  Location: [place];  Service: Neurosurgery;;   CARPAL TUNNEL RELEASE Left 12/24/2017   Procedure: LEFT CARPAL TUNNEL RELEASE;  Surgeon: Erline Levine, MD;  Location: [place];  Service: Neurosurgery;  Laterality: Left;   CESAREAN SECTION     x 1 - twins   COLONOSCOPY     DILATATION & CURRETTAGE/HYSTEROSCOPY WITH RESECTOCOPE N/A 07/18/2013   Procedure: [place];  Surgeon: Lyman Speller, MD;  Location: [facility];  Service: Gynecology;  Laterality: N/A;  mass resection   implantable loop recorder placement  05/20/2021   Medtronic Reveal Linq model LNQ22  implantable loop recorder (SN [id number])   Lung tumor removed     Rt lung carcinoid   MOUTH SURGERY     pre-cancerous ulcer removed   POLYPECTOMY     SHOULDER OPEN ROTATOR CUFF REPAIR  07/20/2010   Right   WISDOM TOOTH EXTRACTION     Social History   Socioeconomic History   Marital status: Married    Spouse name: Nichole Smith   Number of children: 2   Years of education: Not on file   Highest education level: Not on file  Occupational History   Occupation: retired    Fish farm manager: Charity fundraiser  Tobacco Use   Smoking status: Never   Smokeless tobacco: Never  Vaping Use   Vaping Use: Never used  Substance and Sexual Activity   Alcohol use: Not Currently   Drug use: No   Sexual activity: Not Currently    Partners: Male    Birth control/protection: Post-menopausal  Other Topics Concern   Not on file  Social History Narrative   hhof 2    2 Children at college and  beyond      Nichole Smith    Married    Retired age 60 2015   Recently moved taking care of grandchildren during the week.   Social Determinants of Health   Financial Resource Strain: Low Risk    Difficulty of Paying Living Expenses: Not hard at all  Food Insecurity: No Food Insecurity   Worried About Charity fundraiser in the Last Year: Never true   [place] in the Last Year: Never true  Transportation Needs: No Transportation Needs   Lack of Transportation (Medical): No   Lack of Transportation (Non-Medical): No  Physical Activity: Sufficiently Active   Days of Exercise per Week: 7 days   Minutes of Exercise per Session: 30 min  Stress: No Stress Concern Present   Feeling of Stress : Not at all  Social Connections: Socially Integrated   Frequency of Communication with Friends and Family: More than three times a week   Frequency of Social Gatherings with Friends and Family: More than three times a week   Attends Religious Services: More than 4 times per year   Active Member of [employer]  or Organizations: Yes   Attends Music therapist: More than 4 times per year   Marital Status: Married   Allergies  Allergen Reactions   Neomycin Other (See Comments)    Inflammation--eye drop    Ace Inhibitors Cough   Family History  Problem Relation Age of Onset   Stroke Mother        died age 71   Hypertension Mother    Sudden death Mother    Alcoholism Mother    Coronary artery disease Father  died age 30   Hypertension Father    Liver disease Father    Alcohol abuse Father    Obesity Father    Parkinson's disease Brother    Diabetes Brother    Hypertension Brother    Hypertension Maternal Uncle    Colon cancer Neg Hx    Rectal cancer Neg Hx    Stomach cancer Neg Hx    Colon polyps Neg Hx    Esophageal cancer Neg Hx     Current Outpatient Medications (Endocrine & Metabolic):    Dulaglutide (TRULICITY) 3 WN/4.6EV SOPN, Inject 3 mg as directed once a week.   SYNTHROID 137 MCG tablet, TAKE 1 TABLET DAILY (NEED TO SCHEDULE AN APPOINTMENT FOR MORE REFILLS)  Current Outpatient Medications (Cardiovascular):    atorvastatin (LIPITOR) 40 MG tablet, TAKE 1 TABLET DAILY   chlorthalidone (HYGROTON) 25 MG tablet, Take 1 tablet (25 mg total) by mouth daily.   diltiazem (CARDIZEM CD) 180 MG 24 hr capsule, Take 1 capsule (180 mg total) by mouth daily.   diltiazem (CARDIZEM) 30 MG tablet, 1 tablet every 6 hours for palpitations   metoprolol succinate (TOPROL-XL) 50 MG 24 hr tablet, Take 1 tablet (50 mg total) by mouth daily.   Current Outpatient Medications (Analgesics):    aspirin EC 81 MG tablet, Take 81 mg by mouth at bedtime.   naproxen sodium (ALEVE) 220 MG tablet, Take 220 mg by mouth 2 (two) times daily as needed (for pain.).   Current Outpatient Medications (Other):    augmented betamethasone dipropionate (DIPROLENE-AF) 0.05 % ointment, USE AS DIRECTED. DO NOT USE MORE THAN 5 DAYS IN A ROW OR MORE THAN 7 DAYS EACH MONTH.   CALCIUM PO, Take 1 tablet  by mouth at bedtime.    estradiol (ESTRACE VAGINAL) 0.1 MG/GM vaginal cream, INSERT 1 GRAM VAGINALLY TWICE WEEKLY   GEMTESA 75 MG TABS, Take 1 tablet by mouth daily as needed.   Multiple Vitamins-Minerals (PRESERVISION AREDS 2 PO), Take 1 tablet by mouth 2 (two) times daily.   nitrofurantoin, macrocrystal-monohydrate, (MACROBID) 100 MG capsule, Take 1 capsule (100 mg total) by mouth daily.   sulfamethoxazole-trimethoprim (BACTRIM DS) 800-160 MG tablet, Take 1 tablet by mouth 2 (two) times daily.   Objective  Blood pressure 124/80, pulse 79, height 4\' 11"  (1.499 m), weight 218 lb (98.9 kg), last menstrual period 02/12/2011, SpO2 95 %.   General: No apparent distress alert and oriented x3 mood and affect normal, dressed appropriately.  HEENT: Pupils equal, extraocular movements intact  Respiratory: Patient's speak in full sentences and does not appear short of breath  Cardiovascular: No lower extremity edema, non tender, no erythema  Gait normal with good balance and coordination.  MSK: Low back exam does have some loss of lordosis.  Patient does still have an antalgic gait secondary to patient's knee pain as well.  Tightness with straight leg test with no worsening symptoms.  Does have some limited extension of the back noted.    Impression and Recommendations:     The above documentation has been reviewed and is accurate and complete Lyndal Pulley, DO

## 2021-12-10 ENCOUNTER — Other Ambulatory Visit: Payer: Self-pay

## 2021-12-10 ENCOUNTER — Ambulatory Visit (INDEPENDENT_AMBULATORY_CARE_PROVIDER_SITE_OTHER): Payer: Medicare Other | Admitting: Family Medicine

## 2021-12-10 ENCOUNTER — Encounter: Payer: Self-pay | Admitting: Family Medicine

## 2021-12-10 DIAGNOSIS — M5416 Radiculopathy, lumbar region: Secondary | ICD-10-CM | POA: Diagnosis not present

## 2021-12-10 NOTE — Assessment & Plan Note (Signed)
Known severe spinal stenosis.  Patient is going to avoid any surgical intervention with the amount of osteoporosis.  Patient is now on Prolia.  Hopefully this will make some improvement.  Patient knows we can do epidurals when necessary.  Last one was September 2022.  Hopefully patient will increase activity as tolerated.  We will follow-up with me again in 3 months to check in.

## 2021-12-12 ENCOUNTER — Encounter (INDEPENDENT_AMBULATORY_CARE_PROVIDER_SITE_OTHER): Payer: Self-pay | Admitting: Family Medicine

## 2021-12-12 NOTE — Telephone Encounter (Signed)
Please see message and advise.  Thank you. ° °

## 2021-12-13 ENCOUNTER — Encounter (HOSPITAL_BASED_OUTPATIENT_CLINIC_OR_DEPARTMENT_OTHER): Payer: Self-pay | Admitting: Physical Therapy

## 2021-12-13 ENCOUNTER — Other Ambulatory Visit: Payer: Self-pay

## 2021-12-13 ENCOUNTER — Ambulatory Visit (HOSPITAL_BASED_OUTPATIENT_CLINIC_OR_DEPARTMENT_OTHER): Payer: Medicare Other | Admitting: Physical Therapy

## 2021-12-13 DIAGNOSIS — M545 Low back pain, unspecified: Secondary | ICD-10-CM | POA: Diagnosis not present

## 2021-12-13 DIAGNOSIS — M25551 Pain in right hip: Secondary | ICD-10-CM

## 2021-12-13 DIAGNOSIS — R262 Difficulty in walking, not elsewhere classified: Secondary | ICD-10-CM

## 2021-12-13 DIAGNOSIS — M6281 Muscle weakness (generalized): Secondary | ICD-10-CM

## 2021-12-13 NOTE — Therapy (Signed)
OUTPATIENT PHYSICAL THERAPY TREATMENT NOTE   Patient Name: Nichole Smith MRN: [id number] DOB:Mar 07, 1956, 66 y.o., female Today's Date: 12/13/2021  PCP: Burnis Medin, MD REFERRING PROVIDER: Burnis Medin, MD   PT End of Session - 12/13/21 1154     Visit Number 7    Number of Visits 17    Date for PT Re-Evaluation 01/08/22    Authorization Type Medicare    PT Start Time 1100    PT Stop Time 1145    PT Time Calculation (min) 45 min    Activity Tolerance Patient tolerated treatment well    Behavior During Therapy [facility] for tasks assessed/performed                  Past Medical History:  Diagnosis Date   Arthritis    knees, back   Bursitis    hips - almost gone per pt    Cancer ([place])    Carcinoid tumor of lung 2012   right found incidentally on chest xray eval for r/o vasxculitis    Carpal tunnel syndrome, bilateral    bilat but surgery to correct    Coronary artery calcification 07/23/2016   Double vision    history, no current problem   Dysrhythmia    irregular occassional, no current problem   Frequent UTI    HNP (herniated nucleus pulposus), cervical 01/22/2011   C6-C7  Has seen dr Vertell Limber for this and Dr Tonita Cong  Positional numbness in hands no weakness     Hypertension    Hypothyroidism    Infertility, female    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  Download 08/2014:  Good compliance, breakthru apnea on 10cm.  Changed to auto 5-12cm >> good control of AHI on f/u download ONO on CPAP:       Osteoarthritis    PCOS (polycystic ovarian syndrome)    Sleep apnea    wears cpap    SUI (stress urinary incontinence, female)    h/o   Past Surgical History:  Procedure Laterality Date   ANTERIOR CERVICAL DECOMP/DISCECTOMY FUSION  12/24/2017   Procedure: Cervical five-six Cervical six-seven Anterior cervical decompression/discectomy/fusion;  Surgeon: Erline Levine, MD;   Location: [place];  Service: Neurosurgery;;   CARPAL TUNNEL RELEASE Left 12/24/2017   Procedure: LEFT CARPAL TUNNEL RELEASE;  Surgeon: Erline Levine, MD;  Location: [place];  Service: Neurosurgery;  Laterality: Left;   CESAREAN SECTION     x 1 - twins   COLONOSCOPY     DILATATION & CURRETTAGE/HYSTEROSCOPY WITH RESECTOCOPE N/A 07/18/2013   Procedure: [place];  Surgeon: Lyman Speller, MD;  Location: [facility];  Service: Gynecology;  Laterality: N/A;  mass resection   implantable loop recorder placement  05/20/2021   Medtronic Reveal Linq model LNQ22 implantable loop recorder (SN [id number])   Lung tumor removed     Rt lung carcinoid   MOUTH SURGERY     pre-cancerous ulcer removed   POLYPECTOMY     SHOULDER OPEN ROTATOR CUFF REPAIR  07/20/2010   Right   WISDOM TOOTH EXTRACTION     Patient Active Problem List   Diagnosis Date Noted   Gluteal tendonitis of right buttock 06/19/2021   Greater trochanteric bursitis of right hip 05/08/2021   Chronic idiopathic constipation 05/01/2021   Lumbar radiculopathy 04/04/2021   Healthcare maintenance 12/25/2020   Insulin resistance 03/05/2020   Vitamin D deficiency 08/25/2019  Prediabetes 08/25/2019   Acute pain of right knee 97/01/6377   Lichen planus 58/85/0277   Cervical stenosis of spinal canal 12/24/2017   Hyperlipidemia LDL goal <70 08/26/2017   Heart palpitations 08/26/2017   Hammertoe of right foot 01/06/2017   Coronary artery calcification 07/23/2016   Periodic limb movements of sleep 06/18/2016   Atherosclerotic plaque 06/11/2016   Piriformis syndrome of left side 10/24/2015   Loss of transverse plantar arch of left foot 09/12/2015   Thyroiditis, autoimmune 02/26/2015   Obstructive sleep apnea 07/27/2014   Midline low back pain without sciatica 06/20/2014   Polycythemia, secondary 03/22/2014   Severe obesity (BMI >= 40) ([place]) 02/22/2014   Elevated hemoglobin ([place]) 02/22/2014   Left  shoulder pain 02/04/2013   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   HNP (herniated nucleus pulposus), cervical 01/22/2011   Visit for preventive health examination 01/22/2011   PAIN IN JOINT, MULTIPLE SITES 01/21/2010   Hypothyroidism 10/16/2008   Class 3 severe obesity with serious comorbidity and body mass index (BMI) of 40.0 to 44.9 in adult [facility]) 01/07/2008   PALPITATIONS, RECURRENT 01/07/2008   Essential hypertension 06/25/2007   OSTEOARTHRITIS 06/25/2007   REFERRING PROVIDER: Lyndal Pulley, DO   REFERRING DIAG: [phone] (ICD-10-CM) - Lumbar radiculopathy  THERAPY DIAG:  Pain, lumbar region  Pain in right hip  Difficulty walking  Muscle weakness (generalized)  PERTINENT HISTORY: Pt reports osteoporosis was diagnosed by Dr. Sabra Heck.    SUBJECTIVE: Pt feels like she is back to normal/baseline again. She feels some stiffness in the back but that is normal. The HEP helps to loosen up and relieve tension.   PAIN:  Are you having pain? No VAS scale: 0/10 Pain location: R hip and SIJ Pain orientation: Right  PAIN TYPE: dull Pain description: constant  Aggravating factors: stairs, sitting too long, inclines Relieving factors: walking, movement, ice, NSAIDs    PLOF: Independent   PATIENT GOALS : She states she wants to build up her strength into her LE.       OBJECTIVE: DIAGNOSTIC FINDINGS:    L1-L2: Broad-based disc bulge flattening the ventral thecal sac. Moderate bilateral facet arthropathy. Moderate bilateral foraminal stenosis. Mild spinal stenosis.   L2-L3: Broad-based disc bulge. Moderate bilateral facet arthropathy. Moderate spinal stenosis. Moderate-severe bilateral foraminal stenosis.   L3-L4: Mild broad-based disc bulge. Severe bilateral facet arthropathy. Severe spinal stenosis. Bilateral subarticular recess stenosis. Moderate-severe right foraminal stenosis. No left foraminal stenosis.   L4-L5: Broad-based disc bulge. Moderate bilateral facet  arthropathy. Bilateral lateral recess narrowing. Moderate right foraminal stenosis. Mild left foraminal stenosis.   L5-S1: Minimal broad-based disc bulge. Severe bilateral facet arthropathy. Mild left foraminal stenosis. No right foraminal stenosis.   IMPRESSION: 1. Diffuse lumbar spine spondylosis as described above. 2. No acute osseous injury of the lumbar spine.   IMPRESSION: 1. No acute osseous injury of the pelvis. 2. Mild tendinosis of the left gluteus minimus tendon insertion. 3. Moderate tendinosis of the left gluteus medius tendon insertion. 4. Severe tendinosis of the right gluteus medius tendon insertion with a partial thickness tear. 5. Moderate tendinosis of the right gluteus minimus tendon insertion. 6. Partial-thickness tear of the hamstring origins bilaterally.   PATIENT SURVEYS:  FOTO 55 62 at DC 3 pts MCII   FOTO 12/20 57 pts   TODAY'S TREATMENT:. 12/13/21 TREATMENT    Prayer stretch 5s -10s10x L and R (standing at bar)  L8 Rower 3 min 8lb med ball rotation 2x20 8lb Med ball goblet squat with arms extended 3x10  8lb Med ball rotations in half squat 2x10 4lb Med ball slams 2x10 4lb Med ball chest pass in half squat 2x10   12/04/21 TREATMENT    Prayer stretch 5s 10x L and R (standing at bar) Paloff press GTB 2x10 Seated flexion ball stretch 3 way 5s 5x each    12/20 TREATMENT   Farmer's carry 15lb KB offset 57f x4 Double UE farmer's carry [id number] STS with 10lb 2x10 off table RDL 10lb KB 3x10 cuing for hip hinge, trunk position, and bracing   11/30 TREATMENT    Supine Bridge with Resistance Band - 2x10 BTB at knees STS BTB at knees 2x10 8lb KB 8lb KB RDL(trialed but regressed to blue TB pull through) 2x10 Standing hip ABD BTB at ankles 2x10 Seated QL stretch 30s 3x (particular emphasis on L QL)   STM: bilat lumbar paraspinals Joint mobs: L1-5 UPA and L CPA grade III,  11/30 TREATMENT    Supine Bridge with Resistance Band - 2x10  BTB at knees STS BTB at knees 2x10 8lb KB 8lb KB RDL(trialed but regressed to blue TB pull through) 2x10 Standing hip ABD BTB at ankles 2x10 Seated QL stretch 30s 3x (particular emphasis on L QL)   STM: bilat lumbar paraspinals Joint mobs: L1-5 UPA and L CPA grade III,   11/22 TREATMENT    Supine Bridge with Resistance Band - 2x10 BTB at knees Neutral Lumbar Spine Curl Up - 2x10 (one hand support neck) Side Stepping with Resistance at Thighs - 2x laps 2563f Standing hip hike- foot elevated on box 2x10 each Hip hinge with BTB at knees to table 2x10 Seated QL stretch 30s 3x (particular emphasis on L QL)   STM: bilat lumbar paraspinals Joint mobs: L1-5 UPA and L CPA grade III, R ant innominate rotation MET with shotgun       PATIENT EDUCATION:  Education details: lifting form technique, anatomy, exercise progression, DOMS expectations, muscle firing,  envelope of function, HEP, POC   Person educated: Patient Education method: Explanation, Demonstration, Tactile cues, Verbal cues, and Handouts Education comprehension: verbalized understanding, returned demonstration, verbal cues required, and tactile cues required     HOME EXERCISE PROGRAM: Access Code: [email or web address] URL: [email or web address] Date: 10/16/2021 Prepared by: AlaDaleen Bo  ASSESSMENT:   CLINICAL IMPRESSION: Pt presents today without pain and able to continue with previous strengthening. Pt able to progress today to more rate of force development/power focused movements without. Pt had good improvement in hip and back mobility following total body warmup and able to progress lumbar strengthening exercise. Pt advised to hold on power based movements at home at this time. Plan to revisit RFD movements and trunk stabilization.    Objective impairments include Abnormal gait, decreased activity tolerance, decreased endurance, decreased mobility, difficulty walking, decreased ROM, decreased strength,  hypomobility, increased muscle spasms, impaired flexibility, improper body mechanics, postural dysfunction, obesity, and pain. These impairments are limiting patient from cleaning, community activity, yard work, shopping, and exercise/weight loss . Personal factors including Age, Fitness, Time since onset of injury/illness/exacerbation, and 1-2 comorbidities:    are also affecting patient's functional outcome. Patient will benefit from skilled PT to address above impairments and improve overall function.   REHAB POTENTIAL: Good   CLINICAL DECISION MAKING: Stable/uncomplicated   EVALUATION COMPLEXITY: Low     GOALS:     SHORT TERM GOALS:   STG Name Target Date Goal status  1 Pt will become independent with HEP in order to demonstrate synthesis  of PT education.   10/24/2021 INITIAL  2 Pt will be able to demonstrate step up/down without pain and controlled eccentric in order to demonstrate functional improvement in LE function for self-care and community navigation.   11/07/2021 INITIAL  3 Pt will score at least 3 pt increase on FOTO to demonstrate functional improvement in MCII and pt perceived function.    11/07/2021 INITIAL    LONG TERM GOALS:    LTG Name Target Date Goal status  1 Pt  will become independent with final HEP in order to demonstrate synthesis of PT education.   12/05/2021 INITIAL  2 Pt will be able to lift/squat/hold >25 lbs in order to demonstrate functional improvement in lumbopelvic strength for safe return to exercise.    12/05/2021 INITIAL  3 Pt will be able to demonstrate/report ability to walk >30 mins without pain in order to demonstrate functional improvement and tolerance to exercise and community mobility.   12/05/2021 INITIAL  4 Pt will score >/= 62 on FOTO to demonstrate functional improvement in LBP and hip pain.   12/05/2021 INITIAL    PLAN: PT FREQUENCY: 1-2x/week   PT DURATION: 8 weeks   PLANNED INTERVENTIONS: Therapeutic exercises, Therapeutic activity,  Neuro Muscular re-education, Balance training, Gait training, Patient/Family education, Joint mobilization, Stair training, Aquatic Therapy, Dry Needling, Electrical stimulation, Spinal mobilization, Cryotherapy, Moist heat, scar mobilization, Taping, Vasopneumatic device, Traction, Ultrasound, Ionotophoresis 28m/ml Dexamethasone, and Manual therapy   PLAN FOR NEXT SESSION: Progress gym based strengthening with bands and KB, pt has weight equipment at home   ADaleen BoPT, DPT 12/13/21 12:02 PM

## 2021-12-23 ENCOUNTER — Encounter (HOSPITAL_BASED_OUTPATIENT_CLINIC_OR_DEPARTMENT_OTHER): Payer: Medicare Other | Admitting: Physical Therapy

## 2021-12-26 ENCOUNTER — Ambulatory Visit (INDEPENDENT_AMBULATORY_CARE_PROVIDER_SITE_OTHER): Payer: Medicare Other | Admitting: Family Medicine

## 2021-12-26 ENCOUNTER — Other Ambulatory Visit: Payer: Self-pay

## 2021-12-26 ENCOUNTER — Encounter (INDEPENDENT_AMBULATORY_CARE_PROVIDER_SITE_OTHER): Payer: Self-pay | Admitting: Family Medicine

## 2021-12-26 VITALS — BP 111/73 | HR 73 | Temp 98.3°F | Ht 59.0 in | Wt 215.0 lb

## 2021-12-26 DIAGNOSIS — E669 Obesity, unspecified: Secondary | ICD-10-CM | POA: Diagnosis not present

## 2021-12-26 DIAGNOSIS — R7303 Prediabetes: Secondary | ICD-10-CM

## 2021-12-26 DIAGNOSIS — Z6841 Body Mass Index (BMI) 40.0 and over, adult: Secondary | ICD-10-CM | POA: Diagnosis not present

## 2021-12-26 DIAGNOSIS — E7849 Other hyperlipidemia: Secondary | ICD-10-CM | POA: Diagnosis not present

## 2021-12-26 MED ORDER — TRULICITY 1.5 MG/0.5ML ~~LOC~~ SOAJ: 3.0000 mg | SUBCUTANEOUS | 0 refills | Status: DC

## 2021-12-26 NOTE — Progress Notes (Signed)
Chief Complaint:   OBESITY Nichole Smith is here to discuss her progress with her obesity treatment plan along with follow-up of her obesity related diagnoses. [name] is on keeping a food journal and adhering to recommended goals of 1200-1500 calories and 95+ grams protein and states she is following her eating plan approximately 50% of the time. [name] states she is doing PT 15 minutes 7 times per week.  Today's visit was #: 59 Starting weight: 238 lbs Starting date: 01/12/2019 Today's weight: 215 lbs Today's date: 12/26/2021 Total lbs lost to date: 23 Total lbs lost since last in-office visit: 0  Interim History: Pt has had a very significant issue with getting Trulicity. She is unable to get 3 mg dose due to shortage. Pt is struggling to stay within 1200 cal/day- often going high 1300-1400 calories. She looked into weight loss surgery. Pt is looking for other products to start incorporating into plan.   Subjective:   1. Prediabetes Pt is unable to get 3 mg dose Trulicity. [facility] keeps putting off refill time frame.  2. Other hyperlipidemia Pt takes Lipitor and has no myalgias or transaminitis.  Assessment/Plan:   1. Prediabetes Neyda will continue to work on weight loss, exercise, and decreasing simple carbohydrates to help decrease the risk of diabetes.   Refill- Dulaglutide (TRULICITY) 1.5 BS/4.9QP SOPN; Inject 3 mg into the skin once a week.  Dispense: 12 mL; Refill: 0  2. Other hyperlipidemia Cardiovascular risk and specific lipid/LDL goals reviewed.  We discussed several lifestyle modifications today and Jakiah will continue to work on diet, exercise and weight loss efforts. Orders and follow up as documented in patient record. Repeat labs in May.  Counseling Intensive lifestyle modifications are the first line treatment for this issue. Dietary changes: Increase soluble fiber. Decrease simple carbohydrates. Exercise changes: Moderate to vigorous-intensity aerobic  activity 150 minutes per week if tolerated. Lipid-lowering medications: see documented in medical record.  3. Obesity BMI today is 43.4 Nichole Smith is currently in the action stage of change. As such, her goal is to continue with weight loss efforts. She has agreed to keeping a food journal and adhering to recommended goals of 1200-1300 calories and 85+ grams protein.   Exercise goals: All adults should avoid inactivity. Some physical activity is better than none, and adults who participate in any amount of physical activity gain some health benefits. Pt is to start doing more consistent resistance training.  Behavioral modification strategies: increasing lean protein intake, meal planning and cooking strategies, and keeping healthy foods in the home.  [name] has agreed to follow-up with our clinic in 6 weeks with NP [facility]. She was informed of the importance of frequent follow-up visits to maximize her success with intensive lifestyle modifications for her multiple health conditions.   Objective:   Blood pressure 111/73, pulse 73, temperature 98.3 F (36.8 C), height 4\' 11"  (1.499 m), weight 215 lb (97.5 kg), last menstrual period 02/12/2011, SpO2 97 %. Body mass index is 43.42 kg/m.  General: Cooperative, alert, well developed, in no acute distress. HEENT: Conjunctivae and lids unremarkable. Cardiovascular: Regular rhythm.  Lungs: Normal work of breathing. Neurologic: No focal deficits.   Lab Results  Component Value Date   CREATININE 0.67 10/03/2021   BUN 25 10/03/2021   NA 141 10/03/2021   K 4.0 10/03/2021   CL 100 10/03/2021   CO2 28 10/03/2021   Lab Results  Component Value Date   ALT 32 10/03/2021   AST 26 10/03/2021  ALKPHOS 73 10/03/2021   BILITOT 0.5 10/03/2021   Lab Results  Component Value Date   HGBA1C 5.5 10/03/2021   HGBA1C 5.3 06/25/2021   HGBA1C 5.4 06/27/2020   HGBA1C 5.5 01/02/2020   HGBA1C 5.6 08/22/2019   Lab Results  Component Value Date   INSULIN 13.6  10/03/2021   INSULIN 12.2 06/25/2021   INSULIN 13.7 06/27/2020   INSULIN 13.5 01/02/2020   INSULIN 21.5 08/22/2019   Lab Results  Component Value Date   TSH 3.070 10/03/2021   Lab Results  Component Value Date   CHOL 148 10/03/2021   HDL 58 10/03/2021   LDLCALC 76 10/03/2021   TRIG 71 10/03/2021   CHOLHDL 2 07/30/2021   Lab Results  Component Value Date   VD25OH 62.5 10/03/2021   VD25OH 53.1 06/25/2021   VD25OH 51.9 06/27/2020   Lab Results  Component Value Date   WBC 8.7 07/30/2021   HGB 15.3 (H) 07/30/2021   HCT 45.2 07/30/2021   MCV 93.9 07/30/2021   PLT 239.0 07/30/2021    Attestation Statements:   Reviewed by clinician on day of visit: allergies, medications, problem list, medical history, surgical history, family history, social history, and previous encounter notes.  Coral Ceo, CMA, am acting as transcriptionist for Coralie Common, MD.   I have reviewed the above documentation for accuracy and completeness, and I agree with the above. - Coralie Common, MD

## 2021-12-29 ENCOUNTER — Encounter (INDEPENDENT_AMBULATORY_CARE_PROVIDER_SITE_OTHER): Payer: Self-pay | Admitting: Family Medicine

## 2021-12-30 NOTE — Telephone Encounter (Signed)
LOV w/ Dr. Jeani Sow

## 2021-12-31 ENCOUNTER — Encounter (INDEPENDENT_AMBULATORY_CARE_PROVIDER_SITE_OTHER): Payer: Self-pay | Admitting: Family Medicine

## 2021-12-31 NOTE — Telephone Encounter (Signed)
Dr.Ukleja 

## 2022-01-01 NOTE — Telephone Encounter (Signed)
Dr.Ukleja 

## 2022-01-07 ENCOUNTER — Other Ambulatory Visit: Payer: Self-pay

## 2022-01-07 ENCOUNTER — Ambulatory Visit (HOSPITAL_BASED_OUTPATIENT_CLINIC_OR_DEPARTMENT_OTHER): Payer: Medicare Other | Attending: Family Medicine | Admitting: Physical Therapy

## 2022-01-07 ENCOUNTER — Encounter (HOSPITAL_BASED_OUTPATIENT_CLINIC_OR_DEPARTMENT_OTHER): Payer: Self-pay | Admitting: Physical Therapy

## 2022-01-07 DIAGNOSIS — M25551 Pain in right hip: Secondary | ICD-10-CM | POA: Insufficient documentation

## 2022-01-07 DIAGNOSIS — M545 Low back pain, unspecified: Secondary | ICD-10-CM | POA: Diagnosis not present

## 2022-01-07 DIAGNOSIS — R262 Difficulty in walking, not elsewhere classified: Secondary | ICD-10-CM | POA: Diagnosis present

## 2022-01-07 DIAGNOSIS — M6281 Muscle weakness (generalized): Secondary | ICD-10-CM | POA: Insufficient documentation

## 2022-01-07 NOTE — Therapy (Addendum)
OUTPATIENT PHYSICAL THERAPY PROGRESS NOTE   Patient Name: Nichole Smith MRN: [id number] DOB:October 27, 1956, 66 y.o., female Today's Date: 01/07/2022  PCP: Burnis Medin, MD REFERRING PROVIDER: Burnis Medin, MD   PT End of Session - 01/07/22 [phone]     Visit Number 8    Number of Visits 17    Date for PT Re-Evaluation 04/07/22    Authorization Type Medicare    PT Start Time 0935    PT Stop Time 4562    PT Time Calculation (min) 40 min    Activity Tolerance Patient tolerated treatment well    Behavior During Therapy [facility] for tasks assessed/performed                   Past Medical History:  Diagnosis Date   Arthritis    knees, back   Bursitis    hips - almost gone per pt    Cancer ([place])    Carcinoid tumor of lung 2012   right found incidentally on chest xray eval for r/o vasxculitis    Carpal tunnel syndrome, bilateral    bilat but surgery to correct    Coronary artery calcification 07/23/2016   Double vision    history, no current problem   Dysrhythmia    irregular occassional, no current problem   Frequent UTI    HNP (herniated nucleus pulposus), cervical 01/22/2011   C6-C7  Has seen dr Vertell Limber for this and Dr Tonita Cong  Positional numbness in hands no weakness     Hypertension    Hypothyroidism    Infertility, female    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  Download 08/2014:  Good compliance, breakthru apnea on 10cm.  Changed to auto 5-12cm >> good control of AHI on f/u download ONO on CPAP:       Osteoarthritis    PCOS (polycystic ovarian syndrome)    Sleep apnea    wears cpap    SUI (stress urinary incontinence, female)    h/o   Past Surgical History:  Procedure Laterality Date   ANTERIOR CERVICAL DECOMP/DISCECTOMY FUSION  12/24/2017   Procedure: Cervical five-six Cervical six-seven Anterior cervical decompression/discectomy/fusion;  Surgeon: Erline Levine, MD;   Location: [place];  Service: Neurosurgery;;   CARPAL TUNNEL RELEASE Left 12/24/2017   Procedure: LEFT CARPAL TUNNEL RELEASE;  Surgeon: Erline Levine, MD;  Location: [place];  Service: Neurosurgery;  Laterality: Left;   CESAREAN SECTION     x 1 - twins   COLONOSCOPY     DILATATION & CURRETTAGE/HYSTEROSCOPY WITH RESECTOCOPE N/A 07/18/2013   Procedure: [place];  Surgeon: Lyman Speller, MD;  Location: [facility];  Service: Gynecology;  Laterality: N/A;  mass resection   implantable loop recorder placement  05/20/2021   Medtronic Reveal Linq model LNQ22 implantable loop recorder (SN [id number])   Lung tumor removed     Rt lung carcinoid   MOUTH SURGERY     pre-cancerous ulcer removed   POLYPECTOMY     SHOULDER OPEN ROTATOR CUFF REPAIR  07/20/2010   Right   WISDOM TOOTH EXTRACTION     Patient Active Problem List   Diagnosis Date Noted   Gluteal tendonitis of right buttock 06/19/2021   Greater trochanteric bursitis of right hip 05/08/2021   Chronic idiopathic constipation 05/01/2021   Lumbar radiculopathy 04/04/2021   Healthcare maintenance 12/25/2020   Insulin resistance 03/05/2020   Vitamin D  deficiency 08/25/2019   Prediabetes 08/25/2019   Acute pain of right knee 09/60/4540   Lichen planus 98/10/9146   Cervical stenosis of spinal canal 12/24/2017   Hyperlipidemia LDL goal <70 08/26/2017   Heart palpitations 08/26/2017   Hammertoe of right foot 01/06/2017   Coronary artery calcification 07/23/2016   Periodic limb movements of sleep 06/18/2016   Atherosclerotic plaque 06/11/2016   Piriformis syndrome of left side 10/24/2015   Loss of transverse plantar arch of left foot 09/12/2015   Thyroiditis, autoimmune 02/26/2015   Obstructive sleep apnea 07/27/2014   Midline low back pain without sciatica 06/20/2014   Polycythemia, secondary 03/22/2014   Severe obesity (BMI >= 40) ([place]) 02/22/2014   Elevated hemoglobin ([place]) 02/22/2014   Left  shoulder pain 02/04/2013   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   HNP (herniated nucleus pulposus), cervical 01/22/2011   Visit for preventive health examination 01/22/2011   PAIN IN JOINT, MULTIPLE SITES 01/21/2010   Hypothyroidism 10/16/2008   Class 3 severe obesity with serious comorbidity and body mass index (BMI) of 40.0 to 44.9 in adult [facility]) 01/07/2008   PALPITATIONS, RECURRENT 01/07/2008   Essential hypertension 06/25/2007   OSTEOARTHRITIS 06/25/2007   REFERRING PROVIDER: Lyndal Pulley, DO   REFERRING DIAG: [phone] (ICD-10-CM) - Lumbar radiculopathy  THERAPY DIAG:  Pain, lumbar region  Pain in right hip  Muscle weakness (generalized)  Difficulty walking  PERTINENT HISTORY: Pt reports osteoporosis was diagnosed by Dr. Sabra Heck.    SUBJECTIVE: Pt states she had a fall recently while in the woods when with her grandchildren. January She did not have any major injuries. She has started her HEP again and is close to back where she was. She has been having more LBP at the evening but she is having more stiffness. Walking uphill will cause tightening on the R hip and low back.   From last visit to the fall, she was feeling much better and felt like she is able to progress.   PAIN:  Are you having pain? No VAS scale: 0/10 Pain location: R hip and SIJ Pain orientation: Right  PAIN TYPE: dull Pain description: constant  Aggravating factors: stairs, sitting too long, inclines Relieving factors: walking, movement, ice, NSAIDs    PLOF: Independent   PATIENT GOALS : She states she wants to build up her strength into her LE.     PT has had 1 fall- playing/supervising grandchildren while in the woods; tripped over root covered in leaves    OBJECTIVE: DIAGNOSTIC FINDINGS:    L1-L2: Broad-based disc bulge flattening the ventral thecal sac. Moderate bilateral facet arthropathy. Moderate bilateral foraminal stenosis. Mild spinal stenosis.   L2-L3: Broad-based disc bulge.  Moderate bilateral facet arthropathy. Moderate spinal stenosis. Moderate-severe bilateral foraminal stenosis.   L3-L4: Mild broad-based disc bulge. Severe bilateral facet arthropathy. Severe spinal stenosis. Bilateral subarticular recess stenosis. Moderate-severe right foraminal stenosis. No left foraminal stenosis.   L4-L5: Broad-based disc bulge. Moderate bilateral facet arthropathy. Bilateral lateral recess narrowing. Moderate right foraminal stenosis. Mild left foraminal stenosis.   L5-S1: Minimal broad-based disc bulge. Severe bilateral facet arthropathy. Mild left foraminal stenosis. No right foraminal stenosis.   IMPRESSION: 1. Diffuse lumbar spine spondylosis as described above. 2. No acute osseous injury of the lumbar spine.   IMPRESSION: 1. No acute osseous injury of the pelvis. 2. Mild tendinosis of the left gluteus minimus tendon insertion. 3. Moderate tendinosis of the left gluteus medius tendon insertion. 4. Severe tendinosis of the right gluteus medius tendon insertion with a  partial thickness tear. 5. Moderate tendinosis of the right gluteus minimus tendon insertion. 6. Partial-thickness tear of the hamstring origins bilaterally.   PATIENT SURVEYS:  FOTO 55 62 at DC 3 pts MCII   FOTO 12/20 57 pts  FOTO 2/7 (post fall) 50pts  LUMBARAROM/PROM   A/PROM A/PROM  10/10/2021 2/7  Flexion 75% WFL  Extension [facility] WFL  Right lateral flexion 75% WFL p!  Left lateral flexion 75% WFL p!  Right rotation 60% 75%  Left rotation 60% 75%   (Blank rows = not tested)     LE MMT:   MMT Right 10/10/2021 Left 10/10/2021 2/7  Hip flexion 4-/5 p! 4/5 4/5 p!  Hip extension       Hip abduction 4/5 4/5 5/5  Hip adduction 4+/5 4+/5 5/5   (Blank rows = not tested)   LUMBAR SPECIAL TESTS:  Straight leg raise test: Negative, Slump test: Negative, FABER test: Positive, and Trendelenburg sign: Positive   PALPATION ASSESSMENT:  TTP and hypertonicity of R lateral hip  rotators, TTP along R SIJ   FUNCTIONAL TESTS:  STS: WFL    GAIT: Distance walked: 47f Assistive device utilized: None Level of assistance: Complete Independence Comments: toe out, bilat Trendelenburg     TODAY'S TREATMENT:.   2/7 TREATMENT   Review of HEP and self hip stretching/dynamic warm up prior to lifting Self massage with TB 269m Fig 4 stretch 30s  Hip flexor stretch 30s Seated QL stretch 30s 3x (particular emphasis on L QL)   STM: bilat lumbar paraspinals Joint mobs: L1-5 UPA and L CPA grade III, R ant innominate rotation MET with shotgun  12/13/21 TREATMENT    Prayer stretch 5s -10s10x L and R (standing at bar)  L8 Rower 3 min 8lb med ball rotation 2x20 8lb Med ball goblet squat with arms extended 3x10 8lb Med ball rotations in half squat 2x10 4lb Med ball slams 2x10 4lb Med ball chest pass in half squat 2x10   12/04/21 TREATMENT    Prayer stretch 5s 10x L and R (standing at bar) Paloff press GTB 2x10 Seated flexion ball stretch 3 way 5s 5x each    12/20 TREATMENT   Farmer's carry 15lb KB offset [id number] Double UE farmer's carry [id number] STS with 10lb 2x10 off table RDL 10lb KB 3x10 cuing for hip hinge, trunk position, and bracing   11/30 TREATMENT    Supine Bridge with Resistance Band - 2x10 BTB at knees STS BTB at knees 2x10 8lb KB 8lb KB RDL(trialed but regressed to blue TB pull through) 2x10 Standing hip ABD BTB at ankles 2x10 Seated QL stretch 30s 3x (particular emphasis on L QL)   STM: bilat lumbar paraspinals Joint mobs: L1-5 UPA and L CPA grade III,  11/30 TREATMENT    Supine Bridge with Resistance Band - 2x10 BTB at knees STS BTB at knees 2x10 8lb KB 8lb KB RDL(trialed but regressed to blue TB pull through) 2x10 Standing hip ABD BTB at ankles 2x10 Seated QL stretch 30s 3x (particular emphasis on L QL)   STM: bilat lumbar paraspinals Joint mobs: L1-5 UPA and L CPA grade III,   11/22 TREATMENT    Supine Bridge with  Resistance Band - 2x10 BTB at knees Neutral Lumbar Spine Curl Up - 2x10 (one hand support neck) Side Stepping with Resistance at Thighs - 2x laps 25ft57ftanding hip hike- foot elevated on box 2x10 each Hip hinge with BTB at knees to table 2x10 Seated QL stretch  30s 3x (particular emphasis on L QL)   STM: bilat lumbar paraspinals Joint mobs: L1-5 UPA and L CPA grade III, R ant innominate rotation MET with shotgun       PATIENT EDUCATION:  Education details: lifting form technique, anatomy, exercise progression, DOMS expectations, muscle firing,  envelope of function, HEP, POC   Person educated: Patient Education method: Explanation, Demonstration, Tactile cues, Verbal cues, and Handouts Education comprehension: verbalized understanding, returned demonstration, verbal cues required, and tactile cues required     HOME EXERCISE PROGRAM: Access Code: [email or web address] URL: [email or web address] Date: 10/16/2021 Prepared by: Daleen Bo     ASSESSMENT:   CLINICAL IMPRESSION: Pt with improvement in LE strength as compared to previous sessions as well as improvement in overall lumbopelvic motion. However, with pt's report of recent fall, pt does have more pain with movement at end range as well as muscle stiffness/spasm as rest. Pt has improved with functional movements as well as improving tolerance to daily exercise and activity but does require guidance with progression of strength programming. Pt does not appear to have injuries with clinical inspection. Plan to continue with strength progression as tolerated for return to full activity, exercise, and caregiver duties. If no pain exacerbation, plan to continue with weighted strengthening.  Objective impairments include Abnormal gait, decreased activity tolerance, decreased endurance, decreased mobility, difficulty walking, decreased ROM, decreased strength, hypomobility, increased muscle spasms, impaired flexibility, improper body  mechanics, postural dysfunction, obesity, and pain. These impairments are limiting patient from cleaning, community activity, yard work, shopping, and exercise/weight loss . Personal factors including Age, Fitness, Time since onset of injury/illness/exacerbation, and 1-2 comorbidities:    are also affecting patient's functional outcome. Patient will benefit from skilled PT to address above impairments and improve overall function.   REHAB POTENTIAL: Good   CLINICAL DECISION MAKING: Stable/uncomplicated   EVALUATION COMPLEXITY: Low     GOALS:     SHORT TERM GOALS:   STG Name Target Date Goal status  1 Pt will become independent with HEP in order to demonstrate synthesis of PT education.   02/26/2022 met  2 Pt will be able to demonstrate step up/down without pain and controlled eccentric in order to demonstrate functional improvement in LE function for self-care and community navigation.   02/26/2022 met  3 Pt will score at least 3 pt increase on FOTO to demonstrate functional improvement in MCII and pt perceived function.    02/26/2022  MET    LONG TERM GOALS:    LTG Name Target Date Goal status  1 Pt  will become independent with final HEP in order to demonstrate synthesis of PT education.   04/07/2022 ongoing  2 Pt will be able to lift/squat/hold >25 lbs in order to demonstrate functional improvement in lumbopelvic strength for safe return to exercise.    04/07/2022 Partially met  3 Pt will be able to demonstrate/report ability to walk >30 mins without pain in order to demonstrate functional improvement and tolerance to exercise and community mobility.   04/07/2022 Partially met  4 Pt will score >/= 62 on FOTO to demonstrate functional improvement in LBP and hip pain.   04/07/2022  ongoing    PLAN: PT FREQUENCY: 1x every other week   PT DURATION: 8 weeks   PLANNED INTERVENTIONS: Therapeutic exercises, Therapeutic activity, Neuro Muscular re-education, Balance training, Gait training,  Patient/Family education, Joint mobilization, Stair training, Aquatic Therapy, Dry Needling, Electrical stimulation, Spinal mobilization, Cryotherapy, Moist heat, scar mobilization, Taping, Vasopneumatic device, Traction, Ultrasound, Ionotophoresis 73m/ml  Dexamethasone, and Manual therapy   PLAN FOR NEXT SESSION: Progress gym based strengthening with bands and KB, pt has weight equipment at home   Daleen Bo PT, DPT 01/07/22 10:28 AM

## 2022-01-07 NOTE — Addendum Note (Signed)
Addended by: Daleen Bo on: 01/07/2022 10:59 AM   Modules accepted: Orders

## 2022-01-20 ENCOUNTER — Ambulatory Visit (INDEPENDENT_AMBULATORY_CARE_PROVIDER_SITE_OTHER): Payer: Medicare Other

## 2022-01-20 DIAGNOSIS — I480 Paroxysmal atrial fibrillation: Secondary | ICD-10-CM | POA: Diagnosis not present

## 2022-01-20 LAB — CUP PACEART REMOTE DEVICE CHECK
Date Time Interrogation Session: 20230217230305
Implantable Pulse Generator Implant Date: 20220620

## 2022-01-22 ENCOUNTER — Ambulatory Visit (HOSPITAL_BASED_OUTPATIENT_CLINIC_OR_DEPARTMENT_OTHER): Payer: Medicare Other | Admitting: Physical Therapy

## 2022-01-22 ENCOUNTER — Other Ambulatory Visit: Payer: Self-pay

## 2022-01-22 DIAGNOSIS — R262 Difficulty in walking, not elsewhere classified: Secondary | ICD-10-CM

## 2022-01-22 DIAGNOSIS — M6281 Muscle weakness (generalized): Secondary | ICD-10-CM

## 2022-01-22 DIAGNOSIS — M545 Low back pain, unspecified: Secondary | ICD-10-CM | POA: Diagnosis not present

## 2022-01-22 DIAGNOSIS — M25551 Pain in right hip: Secondary | ICD-10-CM

## 2022-01-22 NOTE — Addendum Note (Signed)
Addended by: Daleen Bo on: 01/22/2022 05:44 PM   Modules accepted: Orders

## 2022-01-22 NOTE — Therapy (Signed)
OUTPATIENT PHYSICAL THERAPY PROGRESS NOTE   Patient Name: Nichole Smith MRN: [id number] DOB:September 04, 1956, 66 y.o., female Today's Date: 01/22/2022  PCP: Burnis Medin, MD REFERRING PROVIDER: Burnis Medin, MD   PT End of Session - 01/22/22 1347     Visit Number 9    Number of Visits 17    Date for PT Re-Evaluation 04/07/22    Authorization Type Medicare    PT Start Time 1347    PT Stop Time 1425    PT Time Calculation (min) 38 min    Activity Tolerance Patient tolerated treatment well    Behavior During Therapy [facility] for tasks assessed/performed                   Past Medical History:  Diagnosis Date   Arthritis    knees, back   Bursitis    hips - almost gone per pt    Cancer ([place])    Carcinoid tumor of lung 2012   right found incidentally on chest xray eval for r/o vasxculitis    Carpal tunnel syndrome, bilateral    bilat but surgery to correct    Coronary artery calcification 07/23/2016   Double vision    history, no current problem   Dysrhythmia    irregular occassional, no current problem   Frequent UTI    HNP (herniated nucleus pulposus), cervical 01/22/2011   C6-C7  Has seen dr Vertell Limber for this and Dr Tonita Cong  Positional numbness in hands no weakness     Hypertension    Hypothyroidism    Infertility, female    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  Download 08/2014:  Good compliance, breakthru apnea on 10cm.  Changed to auto 5-12cm >> good control of AHI on f/u download ONO on CPAP:       Osteoarthritis    PCOS (polycystic ovarian syndrome)    Sleep apnea    wears cpap    SUI (stress urinary incontinence, female)    h/o   Past Surgical History:  Procedure Laterality Date   ANTERIOR CERVICAL DECOMP/DISCECTOMY FUSION  12/24/2017   Procedure: Cervical five-six Cervical six-seven Anterior cervical decompression/discectomy/fusion;  Surgeon: Erline Levine, MD;   Location: [facility];  Service: Neurosurgery;;   CARPAL TUNNEL RELEASE Left 12/24/2017   Procedure: LEFT CARPAL TUNNEL RELEASE;  Surgeon: Erline Levine, MD;  Location: [place];  Service: Neurosurgery;  Laterality: Left;   CESAREAN SECTION     x 1 - twins   COLONOSCOPY     DILATATION & CURRETTAGE/HYSTEROSCOPY WITH RESECTOCOPE N/A 07/18/2013   Procedure: [place];  Surgeon: Lyman Speller, MD;  Location: [facility];  Service: Gynecology;  Laterality: N/A;  mass resection   implantable loop recorder placement  05/20/2021   Medtronic Reveal Linq model LNQ22 implantable loop recorder (SN [id number])   Lung tumor removed     Rt lung carcinoid   MOUTH SURGERY     pre-cancerous ulcer removed   POLYPECTOMY     SHOULDER OPEN ROTATOR CUFF REPAIR  07/20/2010   Right   WISDOM TOOTH EXTRACTION     Patient Active Problem List   Diagnosis Date Noted   Gluteal tendonitis of right buttock 06/19/2021   Greater trochanteric bursitis of right hip 05/08/2021   Chronic idiopathic constipation 05/01/2021   Lumbar radiculopathy 04/04/2021   Healthcare maintenance 12/25/2020   Insulin resistance 03/05/2020   Vitamin D deficiency  08/25/2019   Prediabetes 08/25/2019   Acute pain of right knee 39/01/91   Lichen planus 33/00/7622   Cervical stenosis of spinal canal 12/24/2017   Hyperlipidemia LDL goal <70 08/26/2017   Heart palpitations 08/26/2017   Hammertoe of right foot 01/06/2017   Coronary artery calcification 07/23/2016   Periodic limb movements of sleep 06/18/2016   Atherosclerotic plaque 06/11/2016   Piriformis syndrome of left side 10/24/2015   Loss of transverse plantar arch of left foot 09/12/2015   Thyroiditis, autoimmune 02/26/2015   Obstructive sleep apnea 07/27/2014   Midline low back pain without sciatica 06/20/2014   Polycythemia, secondary 03/22/2014   Severe obesity (BMI >= 40) ([place]) 02/22/2014   Elevated hemoglobin ([place]) 02/22/2014   Left  shoulder pain 02/04/2013   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   HNP (herniated nucleus pulposus), cervical 01/22/2011   Visit for preventive health examination 01/22/2011   PAIN IN JOINT, MULTIPLE SITES 01/21/2010   Hypothyroidism 10/16/2008   Class 3 severe obesity with serious comorbidity and body mass index (BMI) of 40.0 to 44.9 in adult [facility]) 01/07/2008   PALPITATIONS, RECURRENT 01/07/2008   Essential hypertension 06/25/2007   OSTEOARTHRITIS 06/25/2007   REFERRING PROVIDER: Lyndal Pulley, DO   REFERRING DIAG: [phone] (ICD-10-CM) - Lumbar radiculopathy  THERAPY DIAG:  Pain, lumbar region  Pain in right hip  Muscle weakness (generalized)  Difficulty walking  PERTINENT HISTORY: Pt reports osteoporosis was diagnosed by Dr. Sabra Heck.    SUBJECTIVE: Pt reports she is having discomfort in Rt high hamstring after incident going up stairs since last visit.  It is especially painful in sitting. She saw neurosurgeon and they are going to hold off on surgery, and that she can resume exercises but with lighter weights for now.   From last visit to the fall, she was feeling much better and felt like she is able to progress.   PAIN:  Are you having pain? Not when sitting; but when moving  VAS scale: 4/10  Pain location: Rt hamstring.  Pain orientation: Right  PAIN TYPE: dull Pain description: constant  Aggravating factors: stairs, sitting too long, inclines Relieving factors: walking, movement, ice, NSAIDs    PLOF: Independent   PATIENT GOALS : She states she wants to build up her strength into her LE.     PT has had 1 fall- playing/supervising grandchildren while in the woods; tripped over root covered in leaves    OBJECTIVE: DIAGNOSTIC FINDINGS:    L1-L2: Broad-based disc bulge flattening the ventral thecal sac. Moderate bilateral facet arthropathy. Moderate bilateral foraminal stenosis. Mild spinal stenosis.   L2-L3: Broad-based disc bulge. Moderate bilateral facet  arthropathy. Moderate spinal stenosis. Moderate-severe bilateral foraminal stenosis.   L3-L4: Mild broad-based disc bulge. Severe bilateral facet arthropathy. Severe spinal stenosis. Bilateral subarticular recess stenosis. Moderate-severe right foraminal stenosis. No left foraminal stenosis.   L4-L5: Broad-based disc bulge. Moderate bilateral facet arthropathy. Bilateral lateral recess narrowing. Moderate right foraminal stenosis. Mild left foraminal stenosis.   L5-S1: Minimal broad-based disc bulge. Severe bilateral facet arthropathy. Mild left foraminal stenosis. No right foraminal stenosis.   IMPRESSION: 1. Diffuse lumbar spine spondylosis as described above. 2. No acute osseous injury of the lumbar spine.   IMPRESSION: 1. No acute osseous injury of the pelvis. 2. Mild tendinosis of the left gluteus minimus tendon insertion. 3. Moderate tendinosis of the left gluteus medius tendon insertion. 4. Severe tendinosis of the right gluteus medius tendon insertion with a partial thickness tear. 5. Moderate tendinosis of the right gluteus  minimus tendon insertion. 6. Partial-thickness tear of the hamstring origins bilaterally.   PATIENT SURVEYS:  FOTO 55 62 at DC 3 pts MCII   FOTO 12/20 57 pts  FOTO 2/7 (post fall) 50pts  LUMBAR AROM/PROM   A/PROM A/PROM  10/10/2021 2/7  Flexion 75% WFL  Extension [facility] WFL  Right lateral flexion 75% WFL p!  Left lateral flexion 75% WFL p!  Right rotation 60% 75%  Left rotation 60% 75%   (Blank rows = not tested)     LE MMT:   MMT Right 10/10/2021 Left 10/10/2021 2/7  Hip flexion 4-/5 p! 4/5 4/5 p!  Hip extension       Hip abduction 4/5 4/5 5/5  Hip adduction 4+/5 4+/5 5/5   (Blank rows = not tested)   LUMBAR SPECIAL TESTS:  Straight leg raise test: Negative, Slump test: Negative, FABER test: Positive, and Trendelenburg sign: Positive   PALPATION ASSESSMENT:  TTP and hypertonicity of R lateral hip rotators, TTP along R SIJ    FUNCTIONAL TESTS:  STS: WFL    GAIT: Distance walked: 59ft Assistive device utilized: None Level of assistance: Complete Independence Comments: toe out, bilat Trendelenburg     TODAY'S TREATMENT:.     2/22  TREATMENT Therapeutic exercises:  NuStep L5: 5 min for warm up.  Half deadlift with 10# with core engaged x 15 Goblet squats with 10#, to buttocks touching chair x 5 (increased pain in Rt buttocks), trialed 3 reps with no weight - less pain. Single leg forward leans to touch chair x 8 reps each - difficulty with single leg balance. Seated hamstring stretch with bent and straight knee R/L x 20 sec x 1 Supine Hamstring stretch RLE with strap x 30 sec x 2    Supine Rt adductor and ITB stretch x 15 sec x 2  Bridge x 2, fig 4 bridge x 5 reps each leg  Manual therapy: to decrease fascial restrictions and improve mobility STM to Rt high hamstring at ischial tuberosity and deep hip rotators  Self care - massage with roller stick and discussed STM with tennis ball to area in sitting .   2/7 TREATMENT   Review of HEP and self hip stretching/dynamic warm up prior to lifting Self massage with TB 29min Fig 4 stretch 30s  Hip flexor stretch 30s Seated QL stretch 30s 3x (particular emphasis on L QL)   STM: bilat lumbar paraspinals Joint mobs: L1-5 UPA and L CPA grade III, R ant innominate rotation MET with shotgun  12/13/21 TREATMENT    Prayer stretch 5s -10s10x L and R (standing at bar)  L8 Rower 3 min 8lb med ball rotation 2x20 8lb Med ball goblet squat with arms extended 3x10 8lb Med ball rotations in half squat 2x10 4lb Med ball slams 2x10 4lb Med ball chest pass in half squat 2x10   12/04/21 TREATMENT    Prayer stretch 5s 10x L and R (standing at bar) Paloff press GTB 2x10 Seated flexion ball stretch 3 way 5s 5x each    12/20 TREATMENT   Farmer's carry 15lb KB offset 46ft x4 Double UE farmer's carry 28ft x3 STS with 10lb 2x10 off table RDL 10lb KB 3x10  cuing for hip hinge, trunk position, and bracing   11/30 TREATMENT    Supine Bridge with Resistance Band - 2x10 BTB at knees STS BTB at knees 2x10 8lb KB 8lb KB RDL(trialed but regressed to blue TB pull through) 2x10 Standing hip ABD BTB at ankles 2x10 Seated  QL stretch 30s 3x (particular emphasis on L QL)   STM: bilat lumbar paraspinals Joint mobs: L1-5 UPA and L CPA grade III,  11/30 TREATMENT    Supine Bridge with Resistance Band - 2x10 BTB at knees STS BTB at knees 2x10 8lb KB 8lb KB RDL(trialed but regressed to blue TB pull through) 2x10 Standing hip ABD BTB at ankles 2x10 Seated QL stretch 30s 3x (particular emphasis on L QL)   STM: bilat lumbar paraspinals Joint mobs: L1-5 UPA and L CPA grade III,   11/22 TREATMENT    Supine Bridge with Resistance Band - 2x10 BTB at knees Neutral Lumbar Spine Curl Up - 2x10 (one hand support neck) Side Stepping with Resistance at Thighs - 2x laps 32ft   Standing hip hike- foot elevated on box 2x10 each Hip hinge with BTB at knees to table 2x10 Seated QL stretch 30s 3x (particular emphasis on L QL)   STM: bilat lumbar paraspinals Joint mobs: L1-5 UPA and L CPA grade III, R ant innominate rotation MET with shotgun       PATIENT EDUCATION:  Education details: lifting form technique, anatomy, exercise progression, DOMS expectations, muscle firing,  envelope of function, HEP, POC   Person educated: Patient Education method: Explanation, Demonstration, Tactile cues, Verbal cues, and Handouts Education comprehension: verbalized understanding, returned demonstration, verbal cues required, and tactile cues required     HOME EXERCISE PROGRAM: Access Code: [email or web address] URL: [email or web address] Date: 10/16/2021 Prepared by: Daleen Bo     ASSESSMENT:   CLINICAL IMPRESSION: Pt presents with increased pain at Rt ischial tuberosity, which increases with weighted squats.  She has some relief with hamstring stretches  (shown version with straight and bent knee) and STM to area.   Encouraged to decrease resistance with current exercise program until this area calms down. Pt progressing well towards remaining goals, although gradual since recent flare up.   Objective impairments include Abnormal gait, decreased activity tolerance, decreased endurance, decreased mobility, difficulty walking, decreased ROM, decreased strength, hypomobility, increased muscle spasms, impaired flexibility, improper body mechanics, postural dysfunction, obesity, and pain. These impairments are limiting patient from cleaning, community activity, yard work, shopping, and exercise/weight loss . Personal factors including Age, Fitness, Time since onset of injury/illness/exacerbation, and 1-2 comorbidities:    are also affecting patient's functional outcome. Patient will benefit from skilled PT to address above impairments and improve overall function.   REHAB POTENTIAL: Good   CLINICAL DECISION MAKING: Stable/uncomplicated   EVALUATION COMPLEXITY: Low     GOALS:     SHORT TERM GOALS:   STG Name Target Date Goal status  1 Pt will become independent with HEP in order to demonstrate synthesis of PT education.   10/24/2021 met  2 Pt will be able to demonstrate step up/down without pain and controlled eccentric in order to demonstrate functional improvement in LE function for self-care and community navigation.   11/07/2021 met  3 Pt will score at least 3 pt increase on FOTO to demonstrate functional improvement in MCII and pt perceived function.    11/07/2021 MET    LONG TERM GOALS:    LTG Name Target Date Goal status  1 Pt  will become independent with final HEP in order to demonstrate synthesis of PT education.   12/05/2021 ongoing  2 Pt will be able to lift/squat/hold >25 lbs in order to demonstrate functional improvement in lumbopelvic strength for safe return to exercise.    12/05/2021 Partially met  3 Pt will be able to  demonstrate/report ability to walk >30 mins without pain in order to demonstrate functional improvement and tolerance to exercise and community mobility.   12/05/2021 Partially met  4 Pt will score >/= 62 on FOTO to demonstrate functional improvement in LBP and hip pain.   12/05/2021 ongoing    PLAN: PT FREQUENCY: 1x every other week   PT DURATION: 8 weeks   PLANNED INTERVENTIONS: Therapeutic exercises, Therapeutic activity, Neuro Muscular re-education, Balance training, Gait training, Patient/Family education, Joint mobilization, Stair training, Aquatic Therapy, Dry Needling, Electrical stimulation, Spinal mobilization, Cryotherapy, Moist heat, scar mobilization, Taping, Vasopneumatic device, Traction, Ultrasound, Ionotophoresis 6m/ml Dexamethasone, and Manual therapy   PLAN FOR NEXT SESSION: Progress gym based strengthening with bands and KB, pt has weight equipment at home.  10th visit note.    .Kerin Perna PTA 01/22/22 4:29 PM

## 2022-01-23 NOTE — Progress Notes (Signed)
[facility] Summary Report / Loop Recorder 

## 2022-01-31 ENCOUNTER — Ambulatory Visit (HOSPITAL_BASED_OUTPATIENT_CLINIC_OR_DEPARTMENT_OTHER): Payer: Medicare Other | Attending: Family Medicine | Admitting: Physical Therapy

## 2022-01-31 ENCOUNTER — Other Ambulatory Visit: Payer: Self-pay

## 2022-01-31 ENCOUNTER — Encounter (HOSPITAL_BASED_OUTPATIENT_CLINIC_OR_DEPARTMENT_OTHER): Payer: Self-pay | Admitting: Physical Therapy

## 2022-01-31 DIAGNOSIS — R262 Difficulty in walking, not elsewhere classified: Secondary | ICD-10-CM | POA: Insufficient documentation

## 2022-01-31 DIAGNOSIS — M545 Low back pain, unspecified: Secondary | ICD-10-CM | POA: Diagnosis not present

## 2022-01-31 DIAGNOSIS — M6281 Muscle weakness (generalized): Secondary | ICD-10-CM | POA: Insufficient documentation

## 2022-01-31 DIAGNOSIS — M25551 Pain in right hip: Secondary | ICD-10-CM | POA: Insufficient documentation

## 2022-01-31 NOTE — Therapy (Addendum)
OUTPATIENT PHYSICAL THERAPY PROGRESS NOTE  Progress Note Reporting Period 10/10/21 to 01/31/22   See note below for Objective Data and Assessment of Progress/Goals.       Patient Name: RAMANDA PAULES MRN: [id number] DOB:1956/12/01, 66 y.o., female Today's Date: 01/31/2022  PCP: Burnis Medin, MD REFERRING PROVIDER: Burnis Medin, MD   PT End of Session - 01/31/22 0847     Visit Number 10    Number of Visits 17    Date for PT Re-Evaluation 04/07/22    Authorization Type Medicare    PT Start Time 0846    PT Stop Time 0930    PT Time Calculation (min) 44 min    Activity Tolerance Patient tolerated treatment well    Behavior During Therapy [facility] for tasks assessed/performed                    Past Medical History:  Diagnosis Date   Arthritis    knees, back   Bursitis    hips - almost gone per pt    Cancer ([place])    Carcinoid tumor of lung 2012   right found incidentally on chest xray eval for r/o vasxculitis    Carpal tunnel syndrome, bilateral    bilat but surgery to correct    Coronary artery calcification 07/23/2016   Double vision    history, no current problem   Dysrhythmia    irregular occassional, no current problem   Frequent UTI    HNP (herniated nucleus pulposus), cervical 01/22/2011   C6-C7  Has seen dr Vertell Limber for this and Dr Tonita Cong  Positional numbness in hands no weakness     Hypertension    Hypothyroidism    Infertility, female    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  Download 08/2014:  Good compliance, breakthru apnea on 10cm.  Changed to auto 5-12cm >> good control of AHI on f/u download ONO on CPAP:       Osteoarthritis    PCOS (polycystic ovarian syndrome)    Sleep apnea    wears cpap    SUI (stress urinary incontinence, female)    h/o   Past Surgical History:  Procedure Laterality Date   ANTERIOR CERVICAL DECOMP/DISCECTOMY FUSION   12/24/2017   Procedure: Cervical five-six Cervical six-seven Anterior cervical decompression/discectomy/fusion;  Surgeon: Erline Levine, MD;  Location: [place];  Service: Neurosurgery;;   CARPAL TUNNEL RELEASE Left 12/24/2017   Procedure: LEFT CARPAL TUNNEL RELEASE;  Surgeon: Erline Levine, MD;  Location: [place];  Service: Neurosurgery;  Laterality: Left;   CESAREAN SECTION     x 1 - twins   COLONOSCOPY     DILATATION & CURRETTAGE/HYSTEROSCOPY WITH RESECTOCOPE N/A 07/18/2013   Procedure: [place];  Surgeon: Lyman Speller, MD;  Location: [facility];  Service: Gynecology;  Laterality: N/A;  mass resection   implantable loop recorder placement  05/20/2021   Medtronic Reveal Linq model LNQ22 implantable loop recorder (SN [id number])   Lung tumor removed     Rt lung carcinoid   MOUTH SURGERY     pre-cancerous ulcer removed   POLYPECTOMY     SHOULDER OPEN ROTATOR CUFF REPAIR  07/20/2010   Right   WISDOM TOOTH EXTRACTION     Patient Active Problem List   Diagnosis Date Noted   Gluteal tendonitis of right buttock 06/19/2021   Greater trochanteric bursitis of right hip 05/08/2021  Chronic idiopathic constipation 05/01/2021   Lumbar radiculopathy 04/04/2021   Healthcare maintenance 12/25/2020   Insulin resistance 03/05/2020   Vitamin D deficiency 08/25/2019   Prediabetes 08/25/2019   Acute pain of right knee 89/84/2103   Lichen planus 12/81/1886   Cervical stenosis of spinal canal 12/24/2017   Hyperlipidemia LDL goal <70 08/26/2017   Heart palpitations 08/26/2017   Hammertoe of right foot 01/06/2017   Coronary artery calcification 07/23/2016   Periodic limb movements of sleep 06/18/2016   Atherosclerotic plaque 06/11/2016   Piriformis syndrome of left side 10/24/2015   Loss of transverse plantar arch of left foot 09/12/2015   Thyroiditis, autoimmune 02/26/2015   Obstructive sleep apnea 07/27/2014   Midline low back pain without sciatica  06/20/2014   Polycythemia, secondary 03/22/2014   Severe obesity (BMI >= 40) ([place]) 02/22/2014   Elevated hemoglobin ([place]) 02/22/2014   Left shoulder pain 02/04/2013   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   HNP (herniated nucleus pulposus), cervical 01/22/2011   Visit for preventive health examination 01/22/2011   PAIN IN JOINT, MULTIPLE SITES 01/21/2010   Hypothyroidism 10/16/2008   Class 3 severe obesity with serious comorbidity and body mass index (BMI) of 40.0 to 44.9 in adult [facility]) 01/07/2008   PALPITATIONS, RECURRENT 01/07/2008   Essential hypertension 06/25/2007   OSTEOARTHRITIS 06/25/2007   REFERRING PROVIDER: Lyndal Pulley, DO   REFERRING DIAG: [phone] (ICD-10-CM) - Lumbar radiculopathy  THERAPY DIAG:  Pain, lumbar region  Pain in right hip  Muscle weakness (generalized)  Difficulty walking  PERTINENT HISTORY: Pt reports osteoporosis was diagnosed by Dr. Sabra Heck.    SUBJECTIVE: Pt reports she has gotten aggressive with stretching, "It feels great afterwards, but is only temporary".  Yesterday she felt like she had turned a corner. She'd like to new some new exercises to do in water today.     PAIN:  Are you having pain? Yes VAS scale: 6/10 Pain location: Lower back, radiating into Rt ant thigh Pain orientation: Right  PAIN TYPE: dull Pain description: constant  Aggravating factors:  sitting too long, inclines Relieving factors: walking, movement, ice, NSAIDs    PLOF: Independent   PATIENT GOALS : She states she wants to build up her strength into her LE.     PT has had 1 fall- playing/supervising grandchildren while in the woods; tripped over root covered in leaves    OBJECTIVE: DIAGNOSTIC FINDINGS:    L1-L2: Broad-based disc bulge flattening the ventral thecal sac. Moderate bilateral facet arthropathy. Moderate bilateral foraminal stenosis. Mild spinal stenosis.   L2-L3: Broad-based disc bulge. Moderate bilateral facet arthropathy. Moderate spinal  stenosis. Moderate-severe bilateral foraminal stenosis.   L3-L4: Mild broad-based disc bulge. Severe bilateral facet arthropathy. Severe spinal stenosis. Bilateral subarticular recess stenosis. Moderate-severe right foraminal stenosis. No left foraminal stenosis.   L4-L5: Broad-based disc bulge. Moderate bilateral facet arthropathy. Bilateral lateral recess narrowing. Moderate right foraminal stenosis. Mild left foraminal stenosis.   L5-S1: Minimal broad-based disc bulge. Severe bilateral facet arthropathy. Mild left foraminal stenosis. No right foraminal stenosis.   IMPRESSION: 1. Diffuse lumbar spine spondylosis as described above. 2. No acute osseous injury of the lumbar spine.   IMPRESSION: 1. No acute osseous injury of the pelvis. 2. Mild tendinosis of the left gluteus minimus tendon insertion. 3. Moderate tendinosis of the left gluteus medius tendon insertion. 4. Severe tendinosis of the right gluteus medius tendon insertion with a partial thickness tear. 5. Moderate tendinosis of the right gluteus minimus tendon insertion. 6. Partial-thickness tear of the hamstring  origins bilaterally.   PATIENT SURVEYS:  FOTO 55 62 at DC 3 pts MCII   FOTO 12/20 57 pts  FOTO 2/7 (post fall) 50pts  LUMBAR AROM/PROM   A/PROM A/PROM  10/10/2021 2/7 01/31/22  Flexion 75% WFL   Extension [facility] WFL   Right lateral flexion 75% WFL p! WNL  Left lateral flexion 75% WFL p! WNL  Right rotation 60% 75% WNL  Left rotation 60% 75% 60%   (Blank rows = not tested)     LE MMT:   MMT Right 10/10/2021 Left 10/10/2021 2/7  Hip flexion 4-/5 p! 4/5 4/5 p!  Hip extension       Hip abduction 4/5 4/5 5/5  Hip adduction 4+/5 4+/5 5/5   (Blank rows = not tested)   LUMBAR SPECIAL TESTS:  Straight leg raise test: Negative, Slump test: Negative, FABER test: Positive, and Trendelenburg sign: Positive   PALPATION ASSESSMENT:  TTP and hypertonicity of R lateral hip rotators, TTP along R SIJ    FUNCTIONAL TESTS:  STS: WFL    GAIT: Distance walked: 46f Assistive device utilized: None Level of assistance: Complete Independence Comments: toe out, bilat Trendelenburg     TODAY'S TREATMENT:.  01/31/22 TREATMENT  Pt seen for aquatic therapy today.  Treatment took place in water 3.25-4 ft [facility] Temp of water was 96.  Pt entered/exited the pool via stairs independently with single rail.  Light jog in 4 ft depth for warm up.   Yellow dumbbells submerged at side with walking for core Ab set with light submerge of yellow noodle x 5 sec x 10; then with added forward row x 15.  Dumbbell (double blue) for forward row x 10 Curtsy lunge x 5 each side; then curtsy followed by knee to noodle, arms on yellow noodle x 10 Wide knee to noodle, Cross knee to noodle - 10 each Warrior one lifting arched noodle over head x 5, each leg Warrior 3 with arms on noodle x 15 sec x 3 reps each leg  Back against wall- hamstring stretch, ITB, adductor (noodle supporting leg)  Warrior 3 to quad stretch with hands on wall, blue noodle at ankle  Trunk rotation with blue dumbbell under water x 15   Pt requires buoyancy for support and to offload joints with strengthening exercises. Viscosity of the water is needed for resistance of strengthening; water current perturbations provides challenge to standing balance unsupported, requiring increased core activation.     2/22  TREATMENT Therapeutic exercises:  NuStep L5: 5 min for warm up.  Half deadlift with 10# with core engaged x 15 Goblet squats with 10#, to buttocks touching chair x 5 (increased pain in Rt buttocks), trialed 3 reps with no weight - less pain. Single leg forward leans to touch chair x 8 reps each - difficulty with single leg balance. Seated hamstring stretch with bent and straight knee R/L x 20 sec x 1 Supine Hamstring stretch RLE with strap x 30 sec x 2    Supine Rt adductor and ITB stretch x 15  sec x 2  Bridge x 2, fig 4 bridge x 5 reps each leg  Manual therapy: to decrease fascial restrictions and improve mobility STM to Rt high hamstring at ischial tuberosity and deep hip rotators  Self care - massage with roller stick and discussed STM with tennis ball to area in sitting .   2/7 TREATMENT   Review of HEP and self hip stretching/dynamic warm up prior  to lifting Self massage with TB 75mn Fig 4 stretch 30s  Hip flexor stretch 30s Seated QL stretch 30s 3x (particular emphasis on L QL)   STM: bilat lumbar paraspinals Joint mobs: L1-5 UPA and L CPA grade III, R ant innominate rotation MET with shotgun  12/13/21 TREATMENT    Prayer stretch 5s -10s10x L and R (standing at bar)  L8 Rower 3 min 8lb med ball rotation 2x20 8lb Med ball goblet squat with arms extended 3x10 8lb Med ball rotations in half squat 2x10 4lb Med ball slams 2x10 4lb Med ball chest pass in half squat 2x10   12/04/21 TREATMENT    Prayer stretch 5s 10x L and R (standing at bar) Paloff press GTB 2x10 Seated flexion ball stretch 3 way 5s 5x each    12/20 TREATMENT   Farmer's carry 15lb KB offset [id number] Double UE farmer's carry [id number] STS with 10lb 2x10 off table RDL 10lb KB 3x10 cuing for hip hinge, trunk position, and bracing   11/30 TREATMENT    Supine Bridge with Resistance Band - 2x10 BTB at knees STS BTB at knees 2x10 8lb KB 8lb KB RDL(trialed but regressed to blue TB pull through) 2x10 Standing hip ABD BTB at ankles 2x10 Seated QL stretch 30s 3x (particular emphasis on L QL)   STM: bilat lumbar paraspinals Joint mobs: L1-5 UPA and L CPA grade III,  11/30 TREATMENT    Supine Bridge with Resistance Band - 2x10 BTB at knees STS BTB at knees 2x10 8lb KB 8lb KB RDL(trialed but regressed to blue TB pull through) 2x10 Standing hip ABD BTB at ankles 2x10 Seated QL stretch 30s 3x (particular emphasis on L QL)   STM: bilat lumbar paraspinals Joint mobs: L1-5 UPA and L CPA grade  III,   11/22 TREATMENT    Supine Bridge with Resistance Band - 2x10 BTB at knees Neutral Lumbar Spine Curl Up - 2x10 (one hand support neck) Side Stepping with Resistance at Thighs - 2x laps [id number] hip hike- foot elevated on box 2x10 each Hip hinge with BTB at knees to table 2x10 Seated QL stretch 30s 3x (particular emphasis on L QL)   STM: bilat lumbar paraspinals Joint mobs: L1-5 UPA and L CPA grade III, R ant innominate rotation MET with shotgun       PATIENT EDUCATION:  Education details: lifting form technique, anatomy, exercise progression, DOMS expectations, muscle firing,  envelope of function, HEP, POC   Person educated: Patient Education method: Explanation, Demonstration, Tactile cues, Verbal cues, and Handouts Education comprehension: verbalized understanding, returned demonstration, verbal cues required, and tactile cues required     HOME EXERCISE PROGRAM: Access Code: [email or web address] URL: [email or web address] Date: updated 01/31/22 Prepared by: AlanDaleen Bo ASSESSMENT:   CLINICAL IMPRESSION: Pt demonstrates improved lumbar side bend and Rt rotation; Lt rotation remains limited but is no longer painful.   FOTO score improved to 63; has met this goal.  Pt tolerated all exercises well, with resolution of pain while in the water.  She required minor cues for form.  She is making great strides towards remaining LTGs.   Objective impairments include Abnormal gait, decreased activity tolerance, decreased endurance, decreased mobility, difficulty walking, decreased ROM, decreased strength, hypomobility, increased muscle spasms, impaired flexibility, improper body mechanics, postural dysfunction, obesity, and pain. These impairments are limiting patient from cleaning, community activity, yard work, shopping, and exercise/weight loss . Personal factors including Age, Fitness, Time since onset of injury/illness/exacerbation, and  1-2 comorbidities:    are also  affecting patient's functional outcome. Patient will benefit from skilled PT to address above impairments and improve overall function.   REHAB POTENTIAL: Good   CLINICAL DECISION MAKING: Stable/uncomplicated   EVALUATION COMPLEXITY: Low     GOALS:     SHORT TERM GOALS:   STG Name Target Date Goal status  1 Pt will become independent with HEP in order to demonstrate synthesis of PT education.   10/24/2021 met  2 Pt will be able to demonstrate step up/down without pain and controlled eccentric in order to demonstrate functional improvement in LE function for self-care and community navigation.   11/07/2021 met  3 Pt will score at least 3 pt increase on FOTO to demonstrate functional improvement in MCII and pt perceived function.    11/07/2021 MET    LONG TERM GOALS:    LTG Name Target Date Goal status  1 Pt  will become independent with final HEP in order to demonstrate synthesis of PT education.   12/05/2021 ongoing  2 Pt will be able to lift/squat/hold >25 lbs in order to demonstrate functional improvement in lumbopelvic strength for safe return to exercise.    12/05/2021 Partially met  3 Pt will be able to demonstrate/report ability to walk >30 mins without pain in order to demonstrate functional improvement and tolerance to exercise and community mobility.   12/05/2021 Partially met  4 Pt will score >/= 62 on FOTO to demonstrate functional improvement in LBP and hip pain.   12/05/2021 Met    PLAN: PT FREQUENCY: 1x every other week   PT DURATION: 8 weeks   PLANNED INTERVENTIONS: Therapeutic exercises, Therapeutic activity, Neuro Muscular re-education, Balance training, Gait training, Patient/Family education, Joint mobilization, Stair training, Aquatic Therapy, Dry Needling, Electrical stimulation, Spinal mobilization, Cryotherapy, Moist heat, scar mobilization, Taping, Vasopneumatic device, Traction, Ultrasound, Ionotophoresis 28m/ml Dexamethasone, and Manual therapy   PLAN FOR NEXT  SESSION: Progress gym based strengthening with bands and KB, pt has weight equipment at home.     .Kerin Perna PTA 01/31/22 10:31 AM   ADaleen BoPT, DPT 01/31/22 11:31 AM

## 2022-02-04 ENCOUNTER — Encounter (HOSPITAL_BASED_OUTPATIENT_CLINIC_OR_DEPARTMENT_OTHER): Payer: Medicare Other | Admitting: Physical Therapy

## 2022-02-05 ENCOUNTER — Encounter (INDEPENDENT_AMBULATORY_CARE_PROVIDER_SITE_OTHER): Payer: Self-pay | Admitting: Adult Health

## 2022-02-05 ENCOUNTER — Other Ambulatory Visit: Payer: Self-pay

## 2022-02-05 ENCOUNTER — Ambulatory Visit (INDEPENDENT_AMBULATORY_CARE_PROVIDER_SITE_OTHER): Payer: Medicare Other | Admitting: Adult Health

## 2022-02-05 VITALS — BP 113/72 | HR 69 | Temp 98.4°F | Ht 59.0 in | Wt 211.0 lb

## 2022-02-05 DIAGNOSIS — Z6841 Body Mass Index (BMI) 40.0 and over, adult: Secondary | ICD-10-CM

## 2022-02-05 DIAGNOSIS — E669 Obesity, unspecified: Secondary | ICD-10-CM

## 2022-02-05 DIAGNOSIS — E66813 Obesity, class 3: Secondary | ICD-10-CM

## 2022-02-05 DIAGNOSIS — R7303 Prediabetes: Secondary | ICD-10-CM | POA: Diagnosis not present

## 2022-02-05 MED ORDER — TRULICITY 3 MG/0.5ML ~~LOC~~ SOAJ: 3.0000 mg | SUBCUTANEOUS | 0 refills | Status: DC

## 2022-02-05 NOTE — Progress Notes (Signed)
? ? ? ?Chief Complaint:  ? ?OBESITY ?Nichole Smith is here to discuss her progress with her obesity treatment plan along with follow-up of her obesity related diagnoses. Nichole Smith is on keeping a food journal and adhering to recommended goals of 1200-1300 calories and 85+ grams of protein and states she is following her eating plan approximately 50% of the time. Nichole Smith states she is doing cardio/swimming/walking for 20 minutes 7 times per week. ? ?Today's visit was #: 60 ?Starting weight: 238 lbs ?Starting date: 01/12/2019 ?Today's weight: 211 lbs ?Today's date: 02/05/2022 ?Total lbs lost to date: 27 lbs ?Total lbs lost since last in-office visit: 4 lbs ? ?Interim History:  ?Nichole Smith says that due to her frustration with slow progress in the Elida prgoram- she started to stretch out her office visits to every 6 weeks. ? ?On average calorie intake per week over the last 3 weeks:  1762 cal/day, 1394 cal/day, 1531 cal/day ?~1572 cal/day when averaged over the last 3 weeks. ? ?Bioimpedence Results reviewed with pt: ?Increase in muscle 4 lbs  ?Decrease in adipose 0.2 lbs  ?Decrease in water 2.8 lbs  ? ?Subjective:  ? ?1. Prediabetes ?Metformin therapy years ago, then converted to GLP-1 therapy. ?Trulicity for years -  ?She recently has been injecting Trulicity 1.5 mg twice weekly due to inability to obtain 3 mg dose. ?She denies mass in neck, dysphagia, dyspepsia, persistent hoarseness, abd pain, or N/V/C. ? ?Assessment/Plan:  ? ?1. Prediabetes ?Refill Trulicity 3 mg once weekly. ? ?- Refill Dulaglutide (TRULICITY) 3 EG/3.1DV SOPN; Inject 3 mg as directed once a week.  Dispense: 12 mL; Refill: 0 ? ?2. Obesity BMI today is 42.7 ? ?Nichole Smith is currently in the action stage of change. As such, her goal is to continue with weight loss efforts. She has agreed to keeping a food journal and adhering to recommended goals of 1300-1500 calories and 85-100 grams of protein.  ? ?Exercise goals:  As is. ? ?Behavioral modification strategies: increasing  lean protein intake, decreasing simple carbohydrates, meal planning and cooking strategies, keeping healthy foods in the home, and planning for success. ? ?Nichole Smith has agreed to follow-up with our clinic in 6 weeks. She was informed of the importance of frequent follow-up visits to maximize her success with intensive lifestyle modifications for her multiple health conditions.  ? ?Objective:  ? ?Blood pressure 113/72, pulse 69, temperature 98.4 ?F (36.9 ?C), height 4\' 11"  (1.499 m), weight 211 lb (95.7 kg), last menstrual period 02/12/2011, SpO2 99 %. ?Body mass index is 42.62 kg/m?. ? ?General: Cooperative, alert, well developed, in no acute distress. ?HEENT: Conjunctivae and lids unremarkable. ?Cardiovascular: Regular rhythm.  ?Lungs: Normal work of breathing. ?Neurologic: No focal deficits.  ? ?Lab Results  ?Component Value Date  ? CREATININE 0.67 10/03/2021  ? BUN 25 10/03/2021  ? NA 141 10/03/2021  ? K 4.0 10/03/2021  ? CL 100 10/03/2021  ? CO2 28 10/03/2021  ? ?Lab Results  ?Component Value Date  ? ALT 32 10/03/2021  ? AST 26 10/03/2021  ? ALKPHOS 73 10/03/2021  ? BILITOT 0.5 10/03/2021  ? ?Lab Results  ?Component Value Date  ? HGBA1C 5.5 10/03/2021  ? HGBA1C 5.3 06/25/2021  ? HGBA1C 5.4 06/27/2020  ? HGBA1C 5.5 01/02/2020  ? HGBA1C 5.6 08/22/2019  ? ?Lab Results  ?Component Value Date  ? INSULIN 13.6 10/03/2021  ? INSULIN 12.2 06/25/2021  ? INSULIN 13.7 06/27/2020  ? INSULIN 13.5 01/02/2020  ? INSULIN 21.5 08/22/2019  ? ?Lab Results  ?Component Value  Date  ? TSH 3.070 10/03/2021  ? ?Lab Results  ?Component Value Date  ? CHOL 148 10/03/2021  ? HDL 58 10/03/2021  ? [place] 76 10/03/2021  ? TRIG 71 10/03/2021  ? CHOLHDL 2 07/30/2021  ? ?Lab Results  ?Component Value Date  ? VD25OH 62.5 10/03/2021  ? VD25OH 53.1 06/25/2021  ? VD25OH 51.9 06/27/2020  ? ?Lab Results  ?Component Value Date  ? WBC 8.7 07/30/2021  ? HGB 15.3 (H) 07/30/2021  ? HCT 45.2 07/30/2021  ? MCV 93.9 07/30/2021  ? PLT 239.0 07/30/2021  ? ?Obesity  Behavioral Intervention:  ? ?Approximately 15 minutes were spent on the discussion below. ? ?ASK: ?We discussed the diagnosis of obesity with Nichole Smith today and Nichole Smith agreed to give [name] permission to discuss obesity behavioral modification therapy today. ? ?ASSESS: ?Nichole Smith has the diagnosis of obesity and her BMI today is 42.7. Nichole Smith is in the action stage of change.  ? ?ADVISE: ?Nichole Smith was educated on the multiple health risks of obesity as well as the benefit of weight loss to improve her health. She was advised of the need for long term treatment and the importance of lifestyle modifications to improve her current health and to decrease her risk of future health problems. ? ?AGREE: ?Multiple dietary modification options and treatment options were discussed and Nichole Smith agreed to follow the recommendations documented in the above note. ? ?ARRANGE: ?Nichole Smith was educated on the importance of frequent visits to treat obesity as outlined per CMS and USPSTF guidelines and agreed to schedule her next follow up appointment today. ? ?Attestation Statements:  ? ?Reviewed by clinician on day of visit: allergies, medications, problem list, medical history, surgical history, family history, social history, and previous encounter notes. ? ?I, Water quality scientist, CMA, am acting as Location manager for Mina Marble, NP. ? ?I have reviewed the above documentation for accuracy and completeness, and I agree with the above. -  Elonda Giuliano d. Nadea Kirkland, NP-C ?

## 2022-02-06 NOTE — Progress Notes (Signed)
Nichole Smith [facility] [address] [place] Phone: [phone] Subjective:   Nichole Smith, am serving as a scribe for Dr. Hulan Saas. This visit occurred during the SARS-CoV-2 public health emergency.  Safety protocols were in place, including screening questions prior to the visit, additional usage of staff PPE, and extensive cleaning of exam room while observing appropriate contact time as indicated for disinfecting solutions.  g I'm seeing this patient by the request  of:  Panosh, Standley Brooking, MD  CC: Back pain follow-up  AST:[id number]  12/10/2021 Known severe spinal stenosis.  Patient is going to avoid any surgical intervention with the amount of osteoporosis.  Patient is now on Prolia.  Hopefully this will make some improvement.  Patient knows we can do epidurals when necessary.  Last one was September 2022.  Hopefully patient will increase activity as tolerated.  We will follow-up with me again in 3 months to check in.  Update 02/07/2022 Nichole Smith is a 66 y.o. female coming in with complaint of lumbar radiculopathy. Patient states PT is helping, but some setback. Fell once. Strained hamstring walking up stairs. Pain is sporadic. Questions for you about pain control. No other complaints.  Patient is wondering if there is anything else she could take.  Patient states that she did not be fully compliant when she was taking certain medications and she is wondering if something would be beneficial at this time.  Intermittently does take Aleve.       Past Medical History:  Diagnosis Date   Arthritis    knees, back   Bursitis    hips - almost gone per pt    Cancer ([place])    Carcinoid tumor of lung 2012   right found incidentally on chest xray eval for r/o vasxculitis    Carpal tunnel syndrome, bilateral    bilat but surgery to correct    Coronary artery calcification 07/23/2016   Double vision    history, no current problem   Dysrhythmia     irregular occassional, no current problem   Frequent UTI    HNP (herniated nucleus pulposus), cervical 01/22/2011   C6-C7  Has seen dr Vertell Limber for this and Dr Tonita Cong  Positional numbness in hands no weakness     Hypertension    Hypothyroidism    Infertility, female    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  Download 08/2014:  Good compliance, breakthru apnea on 10cm.  Changed to auto 5-12cm >> good control of AHI on f/u download ONO on CPAP:       Osteoarthritis    PCOS (polycystic ovarian syndrome)    Sleep apnea    wears cpap    SUI (stress urinary incontinence, female)    h/o   Past Surgical History:  Procedure Laterality Date   ANTERIOR CERVICAL DECOMP/DISCECTOMY FUSION  12/24/2017   Procedure: Cervical five-six Cervical six-seven Anterior cervical decompression/discectomy/fusion;  Surgeon: Erline Levine, MD;  Location: [facility];  Service: Neurosurgery;;   CARPAL TUNNEL RELEASE Left 12/24/2017   Procedure: LEFT CARPAL TUNNEL RELEASE;  Surgeon: Erline Levine, MD;  Location: [facility];  Service: Neurosurgery;  Laterality: Left;   CESAREAN SECTION     x 1 - twins   COLONOSCOPY     DILATATION & CURRETTAGE/HYSTEROSCOPY WITH RESECTOCOPE N/A 07/18/2013   Procedure: [place];  Surgeon: Lyman Speller, MD;  Location: [facility]  ORS;  Service: Gynecology;  Laterality: N/A;  mass resection   implantable loop recorder placement  05/20/2021   Medtronic Reveal Linq model [id number] implantable loop recorder (SN [id number])   Lung tumor removed     Rt lung carcinoid   MOUTH SURGERY     pre-cancerous ulcer removed   POLYPECTOMY     SHOULDER OPEN ROTATOR CUFF REPAIR  07/20/2010   Right   WISDOM TOOTH EXTRACTION     Social History   Socioeconomic History   Marital status: Married    Spouse name: [name]   Number of children: 2   Years of education: Not on file   Highest  education level: Not on file  Occupational History   Occupation: retired    Fish farm manager: Charity fundraiser  Tobacco Use   Smoking status: Never   Smokeless tobacco: Never  Vaping Use   Vaping Use: Never used  Substance and Sexual Activity   Alcohol use: Not Currently   Drug use: No   Sexual activity: Not Currently    Partners: Male    Birth control/protection: Post-menopausal  Other Topics Concern   Not on file  Social History Narrative   hhof 2    2 Children at college and  beyond      [name]    Married    Retired age 68 2015   Recently moved taking care of grandchildren during the week.   Social Determinants of Health   Financial Resource Strain: Low Risk    Difficulty of Paying Living Expenses: Not hard at all  Food Insecurity: No Food Insecurity   Worried About Charity fundraiser in the Last Year: Never true   [place] in the Last Year: Never true  Transportation Needs: No Transportation Needs   Lack of Transportation (Medical): No   Lack of Transportation (Non-Medical): No  Physical Activity: Sufficiently Active   Days of Exercise per Week: 7 days   Minutes of Exercise per Session: 30 min  Stress: No Stress Concern Present   Feeling of Stress : Not at all  Social Connections: Socially Integrated   Frequency of Communication with Friends and Family: More than three times a week   Frequency of Social Gatherings with Friends and Family: More than three times a week   Attends Religious Services: More than 4 times per year   Active Member of [employer] or Organizations: Yes   Attends Music therapist: More than 4 times per year   Marital Status: Married   Allergies  Allergen Reactions   Neomycin Other (See Comments)    Inflammation--eye drop    Ace Inhibitors Cough   Family History  Problem Relation Age of Onset   Stroke Mother        died age 24   Hypertension Mother    Sudden death Mother    Alcoholism Mother     Coronary artery disease Father        died age 76   Hypertension Father    Liver disease Father    Alcohol abuse Father    Obesity Father    Parkinson's disease Brother    Diabetes Brother    Hypertension Brother    Hypertension Maternal Uncle    Colon cancer Neg Hx    Rectal cancer Neg Hx    Stomach cancer Neg Hx    Colon polyps Neg Hx    Esophageal cancer Neg Hx     Current Outpatient Medications (Endocrine &  Metabolic):    Dulaglutide (TRULICITY) 3 DH/6.8SH SOPN, Inject 3 mg as directed once a week.   SYNTHROID 137 MCG tablet, TAKE 1 TABLET DAILY (NEED TO SCHEDULE AN APPOINTMENT FOR MORE REFILLS)  Current Outpatient Medications (Cardiovascular):    atorvastatin (LIPITOR) 40 MG tablet, TAKE 1 TABLET DAILY   chlorthalidone (HYGROTON) 25 MG tablet, Take 1 tablet (25 mg total) by mouth daily.   diltiazem (CARDIZEM CD) 180 MG 24 hr capsule, Take 1 capsule (180 mg total) by mouth daily.   diltiazem (CARDIZEM) 30 MG tablet, 1 tablet every 6 hours for palpitations   metoprolol succinate (TOPROL-XL) 50 MG 24 hr tablet, Take 1 tablet (50 mg total) by mouth daily.   Current Outpatient Medications (Analgesics):    meloxicam (MOBIC) 7.5 MG tablet, Take 1 tablet (7.5 mg total) by mouth daily.   aspirin EC 81 MG tablet, Take 81 mg by mouth at bedtime.   naproxen sodium (ALEVE) 220 MG tablet, Take 220 mg by mouth 2 (two) times daily as needed (for pain.).   Current Outpatient Medications (Other):    gabapentin (NEURONTIN) 100 MG capsule, Take 2 capsules (200 mg total) by mouth 3 (three) times daily.   augmented betamethasone dipropionate (DIPROLENE-AF) 0.05 % ointment, USE AS DIRECTED. DO NOT USE MORE THAN 5 DAYS IN A ROW OR MORE THAN 7 DAYS EACH MONTH.   CALCIUM PO, Take 1 tablet by mouth at bedtime.    estradiol (ESTRACE VAGINAL) 0.1 MG/GM vaginal cream, INSERT 1 GRAM VAGINALLY TWICE WEEKLY   GEMTESA 75 MG TABS, Take 1 tablet by mouth daily as needed.   Multiple Vitamins-Minerals  (PRESERVISION AREDS 2 PO), Take 1 tablet by mouth 2 (two) times daily.   nitrofurantoin, macrocrystal-monohydrate, (MACROBID) 100 MG capsule, Take 1 capsule (100 mg total) by mouth daily.   sulfamethoxazole-trimethoprim (BACTRIM DS) 800-160 MG tablet, Take 1 tablet by mouth 2 (two) times daily.   Reviewed prior external information including notes and imaging from  primary care provider As well as notes that were available from care everywhere and other healthcare systems.  Past medical history, social, surgical and family history all reviewed in electronic medical record.  No pertanent information unless stated regarding to the chief complaint.   Review of Systems:  No headache, visual changes, nausea, vomiting, diarrhea, constipation, dizziness, abdominal pain, skin rash, fevers, chills, night sweats, weight loss, swollen lymph nodes, body aches, joint swelling, chest pain, shortness of breath, mood changes. POSITIVE muscle aches  Objective  Blood pressure 124/82, pulse 67, height 4\' 11"  (1.499 m), weight 215 lb (97.5 kg), last menstrual period 02/12/2011, SpO2 97 %.   General: No apparent distress alert and oriented x3 mood and affect normal, dressed appropriately.  HEENT: Pupils equal, extraocular movements intact  Respiratory: Patient's speak in full sentences and does not appear short of breath  Cardiovascular: No lower extremity edema, non tender, no erythema  Gait antalgic Low back exam does have loss of lordosis.  Patient does have pain on the midline as well as somewhat.  Does have significant tightness with straight leg test bilaterally but neurovascularly intact.    Impression and Recommendations:     The above documentation has been reviewed and is accurate and complete Lyndal Pulley, DO

## 2022-02-07 ENCOUNTER — Other Ambulatory Visit: Payer: Self-pay

## 2022-02-07 ENCOUNTER — Ambulatory Visit (INDEPENDENT_AMBULATORY_CARE_PROVIDER_SITE_OTHER): Payer: Medicare Other | Admitting: Family Medicine

## 2022-02-07 VITALS — BP 124/82 | HR 67 | Ht 59.0 in | Wt 215.0 lb

## 2022-02-07 DIAGNOSIS — M5416 Radiculopathy, lumbar region: Secondary | ICD-10-CM

## 2022-02-07 MED ORDER — MELOXICAM 7.5 MG PO TABS: 7.5000 mg | ORAL_TABLET | Freq: Every day | ORAL | 0 refills | Status: DC

## 2022-02-07 MED ORDER — GABAPENTIN 100 MG PO CAPS: 200.0000 mg | ORAL_CAPSULE | Freq: Three times a day (TID) | ORAL | 2 refills | Status: DC

## 2022-02-07 NOTE — Assessment & Plan Note (Signed)
Secondary to his severe spinal stenosis.  Patient has responded well to injections previously and I do think that if she would respond again to another one at this time.  We discussed icing regimen and home exercises otherwise.  Patient given a prescription for the meloxicam as well as gabapentin.  Patient given new prescriptions today that hopefully will be helpful if necessary.  Patient was to continue with conservative therapy.  Encourage still weight loss.  Patient is working with healthy weight and wellness.  Total time reviewing patient's outside chart including report from neurosurgery, as well as healthy weight and wellness and and discussing with patient and going over imaging greater than 33 minutes. ?

## 2022-02-07 NOTE — Patient Instructions (Addendum)
[facility] [phone] ?Call Today ? ?Dr. Maree Erie ? ?Gabapentin 200mg  3x a day start with night time only ?Meloxicam 7.5mg  daily in 5 days burst when needed ?See you again in 3 months ?

## 2022-02-17 ENCOUNTER — Other Ambulatory Visit: Payer: Self-pay | Admitting: Cardiology

## 2022-02-18 ENCOUNTER — Other Ambulatory Visit: Payer: Self-pay

## 2022-02-18 ENCOUNTER — Ambulatory Visit (HOSPITAL_BASED_OUTPATIENT_CLINIC_OR_DEPARTMENT_OTHER): Payer: Medicare Other | Admitting: Physical Therapy

## 2022-02-18 ENCOUNTER — Encounter (HOSPITAL_BASED_OUTPATIENT_CLINIC_OR_DEPARTMENT_OTHER): Payer: Self-pay | Admitting: Physical Therapy

## 2022-02-18 DIAGNOSIS — M25551 Pain in right hip: Secondary | ICD-10-CM

## 2022-02-18 DIAGNOSIS — M6281 Muscle weakness (generalized): Secondary | ICD-10-CM

## 2022-02-18 DIAGNOSIS — M545 Low back pain, unspecified: Secondary | ICD-10-CM

## 2022-02-18 DIAGNOSIS — R262 Difficulty in walking, not elsewhere classified: Secondary | ICD-10-CM

## 2022-02-18 NOTE — Therapy (Signed)
? ? ? ?Patient Name: Nichole Smith ?MRN: [id number] ?DOB:[birth year], 66 y.o., female ?Today's Date: 02/18/2022 ? ?PCP: Burnis Medin, MD ?REFERRING PROVIDER: Burnis Medin, MD ? ? PT End of Session - 02/18/22 1006   ? ? Visit Number 11   ? Number of Visits 17   ? Date for PT Re-Evaluation 04/07/22   ? Authorization Type Medicare   ? PT Start Time 4818   ? PT Stop Time 1005   ? PT Time Calculation (min) 30 min   ? Activity Tolerance Patient tolerated treatment well   ? Behavior During Therapy [facility] for tasks assessed/performed   ? ?  ?  ? ?  ? ? ? ? ? ? ? ? ? ? ?Past Medical History:  ?Diagnosis Date  ? Arthritis   ? knees, back  ? Bursitis   ? hips - almost gone per pt   ? Cancer [facility])   ? Carcinoid tumor of lung 2012  ? right found incidentally on chest xray eval for r/o vasxculitis   ? Carpal tunnel syndrome, bilateral   ? bilat but surgery to correct   ? Coronary artery calcification 07/23/2016  ? Double vision   ? history, no current problem  ? Dysrhythmia   ? irregular occassional, no current problem  ? Frequent UTI   ? HNP (herniated nucleus pulposus), cervical 01/22/2011  ? C6-C7  Has seen dr Vertell Limber for this and Dr Tonita Cong  Positional numbness in hands no weakness    ? Hypertension   ? Hypothyroidism   ? Infertility, female   ? 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 Obstructive sleep apnea 07/27/2014  ? NPSG 07/2014:  AHI 85/hr, optimal cpap 10cm.  Download 08/2014:  Good compliance, breakthru apnea on 10cm.  Changed to auto 5-12cm >> good control of AHI on f/u download ONO on CPAP:      ? Osteoarthritis   ? PCOS (polycystic ovarian syndrome)   ? Sleep apnea   ? wears cpap   ? SUI (stress urinary incontinence, female)   ? h/o  ? ?Past Surgical History:  ?Procedure Laterality Date  ? ANTERIOR CERVICAL DECOMP/DISCECTOMY FUSION  12/24/2017  ? Procedure: Cervical five-six Cervical six-seven Anterior cervical decompression/discectomy/fusion;  Surgeon: Erline Levine, MD;  Location: [facility];  Service:  Neurosurgery;;  ? CARPAL TUNNEL RELEASE Left 12/24/2017  ? Procedure: LEFT CARPAL TUNNEL RELEASE;  Surgeon: Erline Levine, MD;  Location: [facility];  Service: Neurosurgery;  Laterality: Left;  ? CESAREAN SECTION    ? x 1 - twins  ? COLONOSCOPY    ? DILATATION & CURRETTAGE/HYSTEROSCOPY WITH RESECTOCOPE N/A 07/18/2013  ? Procedure: [place];  Surgeon: Lyman Speller, MD;  Location: [facility];  Service: Gynecology;  Laterality: N/A;  mass resection  ? implantable loop recorder placement  05/20/2021  ? Medtronic Reveal Linq model [id number] implantable loop recorder (SN [id number])  ? Lung tumor removed    ? Rt lung carcinoid  ? MOUTH SURGERY    ? pre-cancerous ulcer removed  ? POLYPECTOMY    ? SHOULDER OPEN ROTATOR CUFF REPAIR  07/20/2010  ? Right  ? WISDOM TOOTH EXTRACTION    ? ?Patient Active Problem List  ? Diagnosis Date Noted  ? Gluteal tendonitis of right buttock 06/19/2021  ? Greater trochanteric bursitis of right hip 05/08/2021  ? Chronic idiopathic constipation 05/01/2021  ? Lumbar radiculopathy 04/04/2021  ? Healthcare maintenance 12/25/2020  ? Insulin resistance 03/05/2020  ? Vitamin D deficiency 08/25/2019  ?  Prediabetes 08/25/2019  ? Acute pain of right knee 09/28/2018  ? Lichen planus 77/82/4235  ? Cervical stenosis of spinal canal 12/24/2017  ? Hyperlipidemia LDL goal <70 08/26/2017  ? Heart palpitations 08/26/2017  ? Hammertoe of right foot 01/06/2017  ? Coronary artery calcification 07/23/2016  ? Periodic limb movements of sleep 06/18/2016  ? Atherosclerotic plaque 06/11/2016  ? Piriformis syndrome of left side 10/24/2015  ? Loss of transverse plantar arch of left foot 09/12/2015  ? 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 Obstructive sleep apnea 07/27/2014  ? Midline low back pain without sciatica 06/20/2014  ? Polycythemia, secondary 03/22/2014  ? Severe obesity (BMI >= 40) ([place]) 02/22/2014  ? Elevated hemoglobin ([place]) 02/22/2014  ? Left shoulder pain 02/04/2013  ?  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 HNP (herniated nucleus pulposus), cervical 01/22/2011  ? Visit for preventive health examination 01/22/2011  ? PAIN IN JOINT, MULTIPLE SITES 01/21/2010  ? Hypothyroidism 10/16/2008  ? Class 3 severe obesity with serious comorbidity and body mass index (BMI) of 45.0 to 49.9 in adult [facility]) 01/07/2008  ? PALPITATIONS, RECURRENT 01/07/2008  ? Essential hypertension 06/25/2007  ? OSTEOARTHRITIS 06/25/2007  ? ?REFERRING PROVIDER: Lyndal Pulley, DO ?  ?REFERRING DIAG: M54.16 (ICD-10-CM) - Lumbar radiculopathy ? ?THERAPY DIAG:  ?Pain, lumbar region ? ?Muscle weakness (generalized) ? ?Pain in right hip ? ?Difficulty walking ? ?PERTINENT HISTORY: Pt reports osteoporosis was diagnosed by Dr. Sabra Heck. ? ? ? ?SUBJECTIVE: Pt states her HS issues has worked its out way out. She is doing much better today. She still has some minor R hip pain. She has been doing the home exercises. She is at 80% of back to "normal." ? ? ? ?PAIN:  ?Are you having pain? No ?VAS scale: 0/10 ?Pain location: Lower back, radiating into Rt ant thigh ?Pain orientation: Right  ?PAIN TYPE: dull ?Pain description: constant  ?Aggravating factors:  sitting too long, inclines ?Relieving factors: walking, movement, ice, NSAIDs  ? ? ?PLOF: Independent ?  ?PATIENT GOALS : She states she wants to build up her strength into her LE.  ?  ? PT has had 1 fall- playing/supervising grandchildren while in the woods; tripped over root covered in leaves  ? ? ?OBJECTIVE: DIAGNOSTIC FINDINGS:  ?  ?L1-L2: Broad-based disc bulge flattening the ventral thecal sac. ?Moderate bilateral facet arthropathy. Moderate bilateral foraminal ?stenosis. Mild spinal stenosis. ?  ?L2-L3: Broad-based disc bulge. Moderate bilateral facet arthropathy. ?Moderate spinal stenosis. Moderate-severe bilateral foraminal ?stenosis. ?  ?L3-L4: Mild broad-based disc bulge. Severe bilateral facet ?arthropathy. Severe spinal stenosis. Bilateral subarticular  recess ?stenosis. Moderate-severe right foraminal stenosis. No left ?foraminal stenosis. ?  ?L4-L5: Broad-based disc bulge. Moderate bilateral facet arthropathy. ?Bilateral lateral recess narrowing. Moderate right foraminal ?stenosis. Mild left foraminal stenosis. ?  ?L5-S1: Minimal broad-based disc bulge. Severe bilateral facet ?arthropathy. Mild left foraminal stenosis. No right foraminal ?stenosis. ?  ?IMPRESSION: ?1. Diffuse lumbar spine spondylosis as described above. ?2. No acute osseous injury of the lumbar spine. ?  ?IMPRESSION: ?1. No acute osseous injury of the pelvis. ?2. Mild tendinosis of the left gluteus minimus tendon insertion. ?3. Moderate tendinosis of the left gluteus medius tendon insertion. ?4. Severe tendinosis of the right gluteus medius tendon insertion ?with a partial thickness tear. ?5. Moderate tendinosis of the right gluteus minimus tendon ?insertion. ?6. Partial-thickness tear of the hamstring origins bilaterally. ?  ?PATIENT SURVEYS:  ?FOTO 55 ?62 at DC ?3 pts MCII  ? ?FOTO 12/20 57 pts  ?FOTO 2/7 (post fall)  50pts ? ?LUMBAR AROM/PROM ?  ?A/PROM A/PROM  ?10/10/2021 2/7 01/31/22  ?Flexion 75% WFL   ?Extension [facility] WFL   ?Right lateral flexion 75% WFL p! WNL  ?Left lateral flexion 75% WFL p! WNL  ?Right rotation 60% 75% WNL  ?Left rotation 60% 75% 60%  ? (Blank rows = not tested) ?  ?  ?LE MMT: ?  ?MMT Right ?10/10/2021 Left ?10/10/2021 2/7  ?Hip flexion 4-/5 p! 4/5 4/5 p!  ?Hip extension       ?Hip abduction 4/5 4/5 5/5  ?Hip adduction 4+/5 4+/5 5/5  ? (Blank rows = not tested) ? ?  ?GAIT: ?Distance walked: 56ft ?Assistive device utilized: None ?Level of assistance: Complete Independence ?Comments: toe out, bilat Trendelenburg ?  ? ? ?TODAY'S TREATMENT:. ? ?3/21 ? ?Full review and update of HEP, addition of mobility based exercises, explanation of form/technique/ self recovery techniques ? ?QL with tennis ball, hip rotators with tennis ball, hamstring with tennis ball  ? ? ? ? 01/31/22  TREATMENT ? Pt seen for aquatic therapy today.  Treatment took place in water 3.25-4 ft in depth at the [facility] pool. Temp of water was 96?.  Pt entered/exited the pool via stairs independently with single rail. ? ?[place]

## 2022-02-24 ENCOUNTER — Ambulatory Visit (INDEPENDENT_AMBULATORY_CARE_PROVIDER_SITE_OTHER): Payer: Medicare Other

## 2022-02-24 DIAGNOSIS — I480 Paroxysmal atrial fibrillation: Secondary | ICD-10-CM | POA: Diagnosis not present

## 2022-02-25 LAB — CUP PACEART REMOTE DEVICE CHECK
Date Time Interrogation Session: 20230326230614
Implantable Pulse Generator Implant Date: 20220620

## 2022-03-04 ENCOUNTER — Encounter (HOSPITAL_BASED_OUTPATIENT_CLINIC_OR_DEPARTMENT_OTHER): Payer: Self-pay | Admitting: Physical Therapy

## 2022-03-04 ENCOUNTER — Ambulatory Visit (HOSPITAL_BASED_OUTPATIENT_CLINIC_OR_DEPARTMENT_OTHER): Payer: Medicare Other | Attending: Family Medicine | Admitting: Physical Therapy

## 2022-03-04 DIAGNOSIS — M25551 Pain in right hip: Secondary | ICD-10-CM | POA: Diagnosis present

## 2022-03-04 DIAGNOSIS — M545 Low back pain, unspecified: Secondary | ICD-10-CM | POA: Diagnosis present

## 2022-03-04 DIAGNOSIS — M6281 Muscle weakness (generalized): Secondary | ICD-10-CM | POA: Diagnosis present

## 2022-03-04 DIAGNOSIS — R262 Difficulty in walking, not elsewhere classified: Secondary | ICD-10-CM | POA: Insufficient documentation

## 2022-03-04 NOTE — Therapy (Signed)
?PHYSICAL THERAPY DISCHARGE SUMMARY ? ?Visits from Start of Care: 12 ? ? ?Plan: ?Patient agrees to discharge.  Patient goals were met. Patient is being discharged due to meeting the stated rehab goals.    ? ? ? ? ? ?Patient Name: Nichole Smith ?MRN: [id number] ?DOB:13-Jul-1956, 66 y.o., female ?Today's Date: 03/04/2022 ? ?PCP: Burnis Medin, MD ?REFERRING PROVIDER: Burnis Medin, MD ? ? PT End of Session - 03/04/22 1110   ? ? Visit Number 12   ? Number of Visits 17   ? Date for PT Re-Evaluation 04/07/22   ? Authorization Type Medicare   ? PT Start Time 1105   ? PT Stop Time 1130   ? PT Time Calculation (min) 25 min   ? Activity Tolerance Patient tolerated treatment well   ? Behavior During Therapy [facility] for tasks assessed/performed   ? ?  ?  ? ?  ? ? ? ? ? ? ? ? ? ? ? ?Past Medical History:  ?Diagnosis Date  ? Arthritis   ? knees, back  ? Bursitis   ? hips - almost gone per pt   ? Cancer [facility])   ? Carcinoid tumor of lung 2012  ? right found incidentally on chest xray eval for r/o vasxculitis   ? Carpal tunnel syndrome, bilateral   ? bilat but surgery to correct   ? Coronary artery calcification 07/23/2016  ? Double vision   ? history, no current problem  ? Dysrhythmia   ? irregular occassional, no current problem  ? Frequent UTI   ? HNP (herniated nucleus pulposus), cervical 01/22/2011  ? C6-C7  Has seen dr Vertell Limber for this and Dr Tonita Cong  Positional numbness in hands no weakness    ? Hypertension   ? Hypothyroidism   ? Infertility, female   ? 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 Obstructive sleep apnea 07/27/2014  ? NPSG 07/2014:  AHI 85/hr, optimal cpap 10cm.  Download 08/2014:  Good compliance, breakthru apnea on 10cm.  Changed to auto 5-12cm >> good control of AHI on f/u download ONO on CPAP:      ? Osteoarthritis   ? PCOS (polycystic ovarian syndrome)   ? Sleep apnea   ? wears cpap   ? SUI (stress urinary incontinence, female)   ? h/o  ? ?Past Surgical History:  ?Procedure Laterality Date  ? ANTERIOR  CERVICAL DECOMP/DISCECTOMY FUSION  12/24/2017  ? Procedure: Cervical five-six Cervical six-seven Anterior cervical decompression/discectomy/fusion;  Surgeon: Erline Levine, MD;  Location: [place];  Service: Neurosurgery;;  ? CARPAL TUNNEL RELEASE Left 12/24/2017  ? Procedure: LEFT CARPAL TUNNEL RELEASE;  Surgeon: Erline Levine, MD;  Location: [place];  Service: Neurosurgery;  Laterality: Left;  ? CESAREAN SECTION    ? x 1 - twins  ? COLONOSCOPY    ? DILATATION & CURRETTAGE/HYSTEROSCOPY WITH RESECTOCOPE N/A 07/18/2013  ? Procedure: [place];  Surgeon: Lyman Speller, MD;  Location: [facility];  Service: Gynecology;  Laterality: N/A;  mass resection  ? implantable loop recorder placement  05/20/2021  ? Medtronic Reveal Linq model [id number] implantable loop recorder (SN [id number])  ? Lung tumor removed    ? Rt lung carcinoid  ? MOUTH SURGERY    ? pre-cancerous ulcer removed  ? POLYPECTOMY    ? SHOULDER OPEN ROTATOR CUFF REPAIR  07/20/2010  ? Right  ? WISDOM TOOTH EXTRACTION    ? ?Patient Active Problem List  ? Diagnosis Date Noted  ? Gluteal tendonitis of  right buttock 06/19/2021  ? Greater trochanteric bursitis of right hip 05/08/2021  ? Chronic idiopathic constipation 05/01/2021  ? Lumbar radiculopathy 04/04/2021  ? Healthcare maintenance 12/25/2020  ? Insulin resistance 03/05/2020  ? Vitamin D deficiency 08/25/2019  ? Prediabetes 08/25/2019  ? Acute pain of right knee 09/28/2018  ? Lichen planus 40/34/7425  ? Cervical stenosis of spinal canal 12/24/2017  ? Hyperlipidemia LDL goal <70 08/26/2017  ? Heart palpitations 08/26/2017  ? Hammertoe of right foot 01/06/2017  ? Coronary artery calcification 07/23/2016  ? Periodic limb movements of sleep 06/18/2016  ? Atherosclerotic plaque 06/11/2016  ? Piriformis syndrome of left side 10/24/2015  ? Loss of transverse plantar arch of left foot 09/12/2015  ? 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 Obstructive sleep apnea 07/27/2014  ? Midline  low back pain without sciatica 06/20/2014  ? Polycythemia, secondary 03/22/2014  ? Severe obesity (BMI >= 40) ([place]) 02/22/2014  ? Elevated hemoglobin ([place]) 02/22/2014  ? Left shoulder pain 02/04/2013  ? 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 HNP (herniated nucleus pulposus), cervical 01/22/2011  ? Visit for preventive health examination 01/22/2011  ? PAIN IN JOINT, MULTIPLE SITES 01/21/2010  ? Hypothyroidism 10/16/2008  ? Class 3 severe obesity with serious comorbidity and body mass index (BMI) of 45.0 to 49.9 in adult [facility]) 01/07/2008  ? PALPITATIONS, RECURRENT 01/07/2008  ? Essential hypertension 06/25/2007  ? OSTEOARTHRITIS 06/25/2007  ? ?REFERRING PROVIDER: Lyndal Pulley, DO ?  ?REFERRING DIAG: M54.16 (ICD-10-CM) - Lumbar radiculopathy ? ?THERAPY DIAG:  ?Pain, lumbar region ? ?Muscle weakness (generalized) ? ?Difficulty walking ? ?Pain in right hip ? ?PERTINENT HISTORY: Pt reports osteoporosis was diagnosed by Dr. Sabra Heck. ? ? ? ?SUBJECTIVE: Pt states she has had low level pain that is intermittent. She knows hows to manage pain and reports that it does not alter her ADL or activities. She is trying to increase her level of activity. ? ?PAIN:  ?Are you having pain? No ?VAS scale: 0/10 ?Pain location: Lower back, radiating into Rt ant thigh ?Pain orientation: Right  ?PAIN TYPE: dull ?Pain description: constant  ?Aggravating factors:  sitting too long, inclines ?Relieving factors: walking, movement, ice, NSAIDs  ? ? ?PLOF: Independent ?  ?PATIENT GOALS : She states she wants to build up her strength into her LE.  ?  ? PT has had 1 fall- playing/supervising grandchildren while in the woods; tripped over root covered in leaves  ? ? ?OBJECTIVE: DIAGNOSTIC FINDINGS:  ?  ?L1-L2: Broad-based disc bulge flattening the ventral thecal sac. ?Moderate bilateral facet arthropathy. Moderate bilateral foraminal ?stenosis. Mild spinal stenosis. ?  ?L2-L3: Broad-based disc bulge. Moderate bilateral facet arthropathy. ?Moderate  spinal stenosis. Moderate-severe bilateral foraminal ?stenosis. ?  ?L3-L4: Mild broad-based disc bulge. Severe bilateral facet ?arthropathy. Severe spinal stenosis. Bilateral subarticular recess ?stenosis. Moderate-severe right foraminal stenosis. No left ?foraminal stenosis. ?  ?L4-L5: Broad-based disc bulge. Moderate bilateral facet arthropathy. ?Bilateral lateral recess narrowing. Moderate right foraminal ?stenosis. Mild left foraminal stenosis. ?  ?L5-S1: Minimal broad-based disc bulge. Severe bilateral facet ?arthropathy. Mild left foraminal stenosis. No right foraminal ?stenosis. ?  ?IMPRESSION: ?1. Diffuse lumbar spine spondylosis as described above. ?2. No acute osseous injury of the lumbar spine. ?  ?IMPRESSION: ?1. No acute osseous injury of the pelvis. ?2. Mild tendinosis of the left gluteus minimus tendon insertion. ?3. Moderate tendinosis of the left gluteus medius tendon insertion. ?4. Severe tendinosis of the right gluteus medius tendon insertion ?with a partial thickness tear. ?5. Moderate tendinosis of the right  gluteus minimus tendon ?insertion. ?6. Partial-thickness tear of the hamstring origins bilaterally. ?  ?PATIENT SURVEYS:  ?FOTO 55 ?62 at DC ?3 pts MCII  ? ?FOTO 12/20 57 pts  ?FOTO 2/7 (post fall) 50pts ? ?FOTO 72 pts  ? ? ?LUMBAR AROM/PROM ?  ?A/PROM A/PROM  ?10/10/2021 2/7 01/31/22 4/4  ?Flexion 75% [facility]  WFL  ?Extension [facility]  WFL  ?Right lateral flexion 75% WFL p! WNL WFL  ?Left lateral flexion 75% WFL p! WNL WFL  ?Right rotation 60% 75% WNL WFL  ?Left rotation 60% 75% 60% WFL  ? (Blank rows = not tested) ?  ?  ?LE MMT: ?  ?MMT Right ?10/10/2021 Left ?10/10/2021 2/7 4/4 4/4  ?Hip flexion 4-/5 p! 4/5 4/5 p! 4+/5 p! 4+/5  ?Hip extension         ?Hip abduction 4/5 4/5 5/5 5/5 5/5  ?Hip adduction 4+/5 4+/5 5/5 5/5 5/5  ? (Blank rows = not tested) ? ?  ?GAIT: ?Distance walked: 66f ?Assistive device utilized: None ?Level of assistance: Complete Independence ?Comments: toe out, bilat  Trendelenburg ?  ? ? ?TODAY'S TREATMENT:. ? ?4/4 ? ?Full review and update of HEP, addition of mobility based exercises, explanation of form/technique/ self recovery techniques ?Exercise progression techniques, SL hi

## 2022-03-05 NOTE — Progress Notes (Signed)
[facility] Summary Report / Loop Recorder 

## 2022-03-20 ENCOUNTER — Ambulatory Visit (INDEPENDENT_AMBULATORY_CARE_PROVIDER_SITE_OTHER): Payer: Medicare Other | Admitting: Adult Health

## 2022-03-20 ENCOUNTER — Encounter (INDEPENDENT_AMBULATORY_CARE_PROVIDER_SITE_OTHER): Payer: Self-pay | Admitting: Adult Health

## 2022-03-20 VITALS — BP 126/78 | HR 74 | Temp 98.3°F | Ht 59.0 in | Wt 212.0 lb

## 2022-03-20 DIAGNOSIS — E669 Obesity, unspecified: Secondary | ICD-10-CM | POA: Diagnosis not present

## 2022-03-20 DIAGNOSIS — I2584 Coronary atherosclerosis due to calcified coronary lesion: Secondary | ICD-10-CM

## 2022-03-20 DIAGNOSIS — E559 Vitamin D deficiency, unspecified: Secondary | ICD-10-CM | POA: Diagnosis not present

## 2022-03-20 DIAGNOSIS — R7303 Prediabetes: Secondary | ICD-10-CM

## 2022-03-20 DIAGNOSIS — I2510 Atherosclerotic heart disease of native coronary artery without angina pectoris: Secondary | ICD-10-CM

## 2022-03-20 DIAGNOSIS — Z6841 Body Mass Index (BMI) 40.0 and over, adult: Secondary | ICD-10-CM | POA: Diagnosis not present

## 2022-03-28 ENCOUNTER — Telehealth: Payer: Self-pay | Admitting: *Deleted

## 2022-03-28 ENCOUNTER — Telehealth (INDEPENDENT_AMBULATORY_CARE_PROVIDER_SITE_OTHER): Payer: Medicare Other | Admitting: Physician Assistant

## 2022-03-28 ENCOUNTER — Ambulatory Visit: Payer: Medicare Other | Admitting: Physician Assistant

## 2022-03-28 ENCOUNTER — Encounter: Payer: Self-pay | Admitting: Physician Assistant

## 2022-03-28 VITALS — Ht 59.0 in | Wt 208.0 lb

## 2022-03-28 DIAGNOSIS — I2584 Coronary atherosclerosis due to calcified coronary lesion: Secondary | ICD-10-CM | POA: Diagnosis not present

## 2022-03-28 DIAGNOSIS — G4733 Obstructive sleep apnea (adult) (pediatric): Secondary | ICD-10-CM

## 2022-03-28 DIAGNOSIS — I480 Paroxysmal atrial fibrillation: Secondary | ICD-10-CM

## 2022-03-28 DIAGNOSIS — I2510 Atherosclerotic heart disease of native coronary artery without angina pectoris: Secondary | ICD-10-CM | POA: Diagnosis not present

## 2022-03-28 DIAGNOSIS — I10 Essential (primary) hypertension: Secondary | ICD-10-CM

## 2022-03-28 MED ORDER — DILTIAZEM HCL ER COATED BEADS 180 MG PO CP24: 180.0000 mg | ORAL_CAPSULE | Freq: Every day | ORAL | 3 refills | Status: DC

## 2022-03-28 MED ORDER — METOPROLOL SUCCINATE ER 50 MG PO TB24: 50.0000 mg | ORAL_TABLET | Freq: Every day | ORAL | 3 refills | Status: DC

## 2022-03-28 MED ORDER — CHLORTHALIDONE 25 MG PO TABS: 25.0000 mg | ORAL_TABLET | Freq: Every day | ORAL | 3 refills | Status: DC

## 2022-03-28 MED ORDER — ATORVASTATIN CALCIUM 40 MG PO TABS: 40.0000 mg | ORAL_TABLET | Freq: Every day | ORAL | 3 refills | Status: DC

## 2022-03-28 NOTE — Telephone Encounter (Signed)
?  Patient Consent for Virtual Visit  ? ? ? ?      :[id number]}   ?  ? ?Nichole Smith has provided verbal consent on 03/28/2022 for a virtual visit (video or telephone). ? ? ?CONSENT FOR VIRTUAL VISIT FOR:  Nichole Smith  ?By participating in this virtual visit I agree to the following: ? ?I hereby voluntarily request, consent and authorize [place] and its employed or contracted physicians, physician assistants, nurse practitioners or other licensed health care professionals (the Practitioner), to provide me with telemedicine health care services (the ?Services") as deemed necessary by the treating Practitioner. I acknowledge and consent to receive the Services by the Practitioner via telemedicine. I understand that the telemedicine visit will involve communicating with the Practitioner through live audiovisual communication technology and the disclosure of certain medical information by electronic transmission. I acknowledge that I have been given the opportunity to request an in-person assessment or other available alternative prior to the telemedicine visit and am voluntarily participating in the telemedicine visit. ? ?I understand that I have the right to withhold or withdraw my consent to the use of telemedicine in the course of my care at any time, without affecting my right to future care or treatment, and that the Practitioner or I may terminate the telemedicine visit at any time. I understand that I have the right to inspect all information obtained and/or recorded in the course of the telemedicine visit and may receive copies of available information for a reasonable fee.  I understand that some of the potential risks of receiving the Services via telemedicine include:  ?Delay or interruption in medical evaluation due to technological equipment failure or disruption; ?Information transmitted may not be sufficient (e.g. poor resolution of images) to allow for appropriate medical decision making  by the Practitioner; and/or  ?In rare instances, security protocols could fail, causing a breach of personal health information. ? ?Furthermore, I acknowledge that it is my responsibility to provide information about my medical history, conditions and care that is complete and accurate to the best of my ability. I acknowledge that Practitioner's advice, recommendations, and/or decision may be based on factors not within their control, such as incomplete or inaccurate data provided by me or distortions of diagnostic images or specimens that may result from electronic transmissions. I understand that the practice of medicine is not an exact science and that Practitioner makes no warranties or guarantees regarding treatment outcomes. I acknowledge that a copy of this consent can be made available to me via my patient portal ([place]), or I can request a printed copy by calling the office of [place].   ? ?I understand that my insurance will be billed for this visit.  ? ?I have read or had this consent read to me. ?I understand the contents of this consent, which adequately explains the benefits and risks of the Services being provided via telemedicine.  ?I have been provided ample opportunity to ask questions regarding this consent and the Services and have had my questions answered to my satisfaction. ?I give my informed consent for the services to be provided through the use of telemedicine in my medical care ? ?  ?

## 2022-03-28 NOTE — Patient Instructions (Signed)
Medication Instructions:  ?Your physician recommends that you continue on your current medications as directed. Please refer to the Current Medication list given to you today. ? ?*If you need a refill on your cardiac medications before your next appointment, please call your pharmacy* ? ? ?Lab Work: ?None ordered  ? ?If you have labs (blood work) drawn today and your tests are completely normal, you will receive your results only by: ?MyChart Message (if you have MyChart) OR ?A paper copy in the mail ?If you have any lab test that is abnormal or we need to change your treatment, we will call you to review the results. ? ? ?Testing/Procedures: ?None ordered  ? ? ?Follow-Up: ?Follow up as scheduled  :1}  ? ? ?Other Instructions ? ?Important Information About Sugar ? ? ? ? ?  ?

## 2022-03-28 NOTE — Progress Notes (Signed)
?  ? ?Virtual Visit via Video Note  ? ?This visit type was conducted due to national recommendations for restrictions regarding the COVID-19 Pandemic (e.g. social distancing) in an effort to limit this patient's exposure and mitigate transmission in our community.  Due to her co-morbid illnesses, this patient is at least at moderate risk for complications without adequate follow up.  This format is felt to be most appropriate for this patient at this time.  All issues noted in this document were discussed and addressed.  A limited physical exam was performed with this format.  Please refer to the patient's chart for her consent to telehealth for [facility]. ? ?Evaluation Performed:  Follow-up visit ? ?Date:  03/28/2022  ? ?ID:  Nichole Smith, DOB 26-Jul-1956, MRN [id number] ? ?Patient Location: Home ?Provider Location: Office/Clinic ? ?PCP:  Burnis Medin, MD  ?Cardiologist:  Fransico Him, MD  ?Electrophysiologist:  Desirai Traxler Grayer, MD  ? ?Chief Complaint:  follow up  ? ?History of Present Illness:   ? ?Nichole Smith is a 66 y.o. 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 Cyndia Bent, OSA on CPAP, PAF not on [place] given low afib burden (0.2% on ILR), HTN, HLD, coronary artery calcifications on chest CT as well as calcifications of the aortic arch and carotid bifurcation, and obesity who presents for follow up.  ? ?She had a calcium score that was elevated at 1272 on 01/11/21. She had a nuclear stress test 02/08/20 that was low risk with no ischemia EF 62%.  ? ?The patient has had short episodes of palpitations for years. Atrial fibrillation was caught on a [place] in February 2022.  She was treated with diltiazem and metoprolol.  When discussing oral anticoagulation for a CHA2DS2-VASc of 3, she was hesitant due to her need for medications like Celebrex for her arthritis and low burden of A-fib.  It was ultimately decided to implant an ILR to get a true burden of  atrial fibrillation. ? ?Most recent ILR interrogation 02/25/22 showed no new AF episodes. 1 new symptom noted on 01/31/22 that correlated with SR with ectopy. Last interrogation with any afib was 06/22/21.  She was last seen in the office by Dr. Rayann Heman on 07/22/2021 and doing well at that time. ? ?Today the patient presents for follow up. She does 3.5-4 hours of activity a day. She has had to have injections in back which is mostly what limits her. She is working with the healthy weight loss program with [facility] and she has lost 30 lbs. 208 lbs today. She is on a restricted diet and is on Truclicty to help satiety. She walks 30 minutes a day and has no shortness of breath or chest pain.  ? ? ?Past Medical History:  ?Diagnosis Date  ? Arthritis   ? knees, back  ? Bursitis   ? hips - almost gone per pt   ? Cancer [facility])   ? Carcinoid tumor of lung 2012  ? right found incidentally on chest xray eval for r/o vasxculitis   ? Carpal tunnel syndrome, bilateral   ? bilat but surgery to correct   ? Coronary artery calcification 07/23/2016  ? Double vision   ? history, no current problem  ? Dysrhythmia   ? irregular occassional, no current problem  ? Frequent UTI   ? HNP (herniated nucleus pulposus), cervical 01/22/2011  ? C6-C7  Has seen dr Vertell Limber for this and Dr Tonita Cong  Positional numbness in hands no weakness    ? Hypertension   ? Hypothyroidism   ? Infertility, female   ? 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 Obstructive sleep apnea 07/27/2014  ? NPSG 07/2014:  AHI 85/hr, optimal cpap 10cm.  Download 08/2014:  Good compliance, breakthru apnea on 10cm.  Changed to auto 5-12cm >> good control of AHI on f/u download ONO on CPAP:      ? Osteoarthritis   ? PCOS (polycystic ovarian syndrome)   ? Sleep apnea   ? wears cpap   ? SUI (stress urinary incontinence, female)   ? h/o  ? ?Past Surgical History:  ?Procedure Laterality Date  ? ANTERIOR CERVICAL DECOMP/DISCECTOMY FUSION  12/24/2017  ? Procedure: Cervical five-six Cervical  six-seven Anterior cervical decompression/discectomy/fusion;  Surgeon: Erline Levine, MD;  Location: [place];  Service: Neurosurgery;;  ? CARPAL TUNNEL RELEASE Left 12/24/2017  ? Procedure: LEFT CARPAL TUNNEL RELEASE;  Surgeon: Erline Levine, MD;  Location: [place];  Service: Neurosurgery;  Laterality: Left;  ? CESAREAN SECTION    ? x 1 - twins  ? COLONOSCOPY    ? DILATATION & CURRETTAGE/HYSTEROSCOPY WITH RESECTOCOPE N/A 07/18/2013  ? Procedure: [place];  Surgeon: Lyman Speller, MD;  Location: [place] [facility];  Service: Gynecology;  Laterality: N/A;  mass resection  ? implantable loop recorder placement  05/20/2021  ? Medtronic Reveal Linq model [id number] implantable loop recorder (SN [id number])  ? Lung tumor removed    ? Rt lung carcinoid  ? MOUTH SURGERY    ? pre-cancerous ulcer removed  ? POLYPECTOMY    ? SHOULDER OPEN ROTATOR CUFF REPAIR  07/20/2010  ? Right  ? WISDOM TOOTH EXTRACTION    ?  ? ?Current Meds  ?Medication Sig  ? aspirin EC 81 MG tablet Take 81 mg by mouth at bedtime.  ? augmented betamethasone dipropionate (DIPROLENE-AF) 0.05 % ointment USE AS DIRECTED. DO NOT USE MORE THAN 5 DAYS IN A ROW OR MORE THAN 7 DAYS EACH MONTH.  ? CALCIUM PO Take 1 tablet by mouth at bedtime.   ? diltiazem (CARDIZEM) 30 MG tablet 1 tablet every 6 hours for palpitations  ? Dulaglutide (TRULICITY) 3 WY/6.3ZC SOPN Inject 3 mg as directed once a week.  ? estradiol (ESTRACE VAGINAL) 0.1 MG/GM vaginal cream INSERT 1 GRAM VAGINALLY TWICE WEEKLY  ? gabapentin (NEURONTIN) 100 MG capsule Take 2 capsules (200 mg total) by mouth 3 (three) times daily.  ? GEMTESA 75 MG TABS Take 1 tablet by mouth daily as needed.  ? meloxicam (MOBIC) 7.5 MG tablet Take 1 tablet (7.5 mg total) by mouth daily.  ? Multiple Vitamins-Minerals (PRESERVISION AREDS 2 PO) Take 1 tablet by mouth 2 (two) times daily.  ? naproxen sodium (ALEVE) 220 MG tablet Take 220 mg by mouth 2 (two) times daily as needed (for pain.).  ?  nitrofurantoin, macrocrystal-monohydrate, (MACROBID) 100 MG capsule Take 1 capsule (100 mg total) by mouth daily.  ? sulfamethoxazole-trimethoprim (BACTRIM DS) 800-160 MG tablet Take 1 tablet by mouth 2 (two) times daily.  ? SYNTHROID 137 MCG tablet TAKE 1 TABLET DAILY (NEED TO SCHEDULE AN APPOINTMENT FOR MORE REFILLS)  ? [DISCONTINUED] atorvastatin (LIPITOR) 40 MG tablet TAKE 1 TABLET DAILY  ? [DISCONTINUED] chlorthalidone (HYGROTON) 25 MG tablet Take 1 tablet (25 mg total) by mouth daily. Please schedule appt for future refills. 1st attempt  ? [DISCONTINUED] diltiazem (CARDIZEM CD) 180 MG 24 hr capsule Take 1 capsule (180 mg total) by mouth daily.  ? [  DISCONTINUED] metoprolol succinate (TOPROL-XL) 50 MG 24 hr tablet Take 1 tablet (50 mg total) by mouth daily.  ?  ? ?Allergies:   Neomycin and Ace inhibitors  ? ?Social History  ? ?Tobacco Use  ? Smoking status: Never  ? Smokeless tobacco: Never  ?Vaping Use  ? Vaping Use: Never used  ?Substance Use Topics  ? Alcohol use: Not Currently  ? Drug use: No  ?  ? ?Family Hx: ?The patient's family history includes Alcohol abuse in her father; Alcoholism in her mother; Coronary artery disease in her father; Diabetes in her brother; Hypertension in her brother, father, maternal uncle, and mother; Liver disease in her father; Obesity in her father; Parkinson's disease in her brother; Stroke in her mother; Sudden death in her mother. There is no history of Colon cancer, Rectal cancer, Stomach cancer, Colon polyps, or Esophageal cancer. ? ?ROS:   ?Please see the history of present illness.    ?All other systems reviewed and are negative. ? ? ?Prior CV studies:   ?The following studies were reviewed today: ? ?12/13/18 Echo ?Study Conclusions  ?- Left ventricle: The cavity size was normal. Wall thickness was  ?  normal. Systolic function was normal. The estimated ejection  ?  fraction was in the range of 60% to 65%. Wall motion was normal;  ?  there were no regional wall motion  abnormalities. Doppler  ?  parameters are consistent with abnormal left ventricular  ?  relaxation (grade 1 diastolic dysfunction).  ? ?________________ ? ?Calcium score 01/02/20 ?IMPRESSION: ?Coronary calcium score o

## 2022-03-28 NOTE — Progress Notes (Deleted)
Cardiology Office Note:    Date:  03/28/2022   ID:  Nichole, Smith 1956-01-06, MRN [id number]  PCP:  Burnis Medin, MD   [facility] Providers Cardiologist:  Fransico Him, MD Electrophysiologist:  Haruo Stepanek Grayer, MD { Click to update primary MD,subspecialty MD or APP then REFRESH:1}    Referring MD: Burnis Medin, MD   Follow up.   History of Present Illness:    Nichole Smith is a 66 y.o. female with a hx of OSA on CPAP, PAF not on [place] given low afib burden (0.2% on ILR), HTN, HLD and morbid obesity who presents for follow up.   The patient has had short episodes of palpitations for years.  Atrial fibrillation was caught on a [place] in February 2022.  She was treated with diltiazem and metoprolol.  When discussing oral anticoagulation for a CHA2DS2-VASc of 3, she was hesitant due to her need for medications like Celebrex for her arthritis and low burden of A-fib.  It was ultimately decided to implant an ILR to get a true burden of atrial fibrillation.  Most recent ILR interrogation 02/25/22 showed no new AF episodes. 1 new symptom 01/31/22 EGM shows SR with ectopy. Last interrogation with any afib was 06/22/21.  She was last seen in the office by Dr. Rayann Heman on 07/22/2021 and doing well at that time.  And doing well at that time.and doing well at that time.   Today the patient presents to clinic for follow up.    Past Medical History:  Diagnosis Date   Arthritis    knees, back   Bursitis    hips - almost gone per pt    Cancer ([place])    Carcinoid tumor of lung 2012   right found incidentally on chest xray eval for r/o vasxculitis    Carpal tunnel syndrome, bilateral    bilat but surgery to correct    Coronary artery calcification 07/23/2016   Double vision    history, no current problem   Dysrhythmia    irregular occassional, no current problem   Frequent UTI    HNP (herniated nucleus pulposus), cervical 01/22/2011   C6-C7  Has seen dr Vertell Limber for this and Dr Tonita Cong   Positional numbness in hands no weakness     Hypertension    Hypothyroidism    Infertility, female    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  Download 08/2014:  Good compliance, breakthru apnea on 10cm.  Changed to auto 5-12cm >> good control of AHI on f/u download ONO on CPAP:       Osteoarthritis    PCOS (polycystic ovarian syndrome)    Sleep apnea    wears cpap    SUI (stress urinary incontinence, female)    h/o    Past Surgical History:  Procedure Laterality Date   ANTERIOR CERVICAL DECOMP/DISCECTOMY FUSION  12/24/2017   Procedure: Cervical five-six Cervical six-seven Anterior cervical decompression/discectomy/fusion;  Surgeon: Erline Levine, MD;  Location: [place];  Service: Neurosurgery;;   CARPAL TUNNEL RELEASE Left 12/24/2017   Procedure: LEFT CARPAL TUNNEL RELEASE;  Surgeon: Erline Levine, MD;  Location: [place];  Service: Neurosurgery;  Laterality: Left;   CESAREAN SECTION     x 1 - twins   COLONOSCOPY     DILATATION & CURRETTAGE/HYSTEROSCOPY WITH RESECTOCOPE N/A 07/18/2013   Procedure: [place];  Surgeon: Lyman Speller, MD;  Location:  [facility];  Service: Gynecology;  Laterality: N/A;  mass resection   implantable loop recorder placement  05/20/2021   Medtronic Reveal Linq model [id number] implantable loop recorder (SN [id number])   Lung tumor removed     Rt lung carcinoid   MOUTH SURGERY     pre-cancerous ulcer removed   POLYPECTOMY     SHOULDER OPEN ROTATOR CUFF REPAIR  07/20/2010   Right   WISDOM TOOTH EXTRACTION      Current Medications: No outpatient medications have been marked as taking for the 03/28/22 encounter (Appointment) with Eileen Stanford, PA-C.     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   Social Determinants of Health   Financial Resource Strain: Low Risk    Difficulty of Paying Living Expenses: Not hard at all  Food Insecurity: No Food Insecurity   Worried About Charity fundraiser in the Last Year: Never true   Helper in the Last Year: Never true  Transportation Needs: No Transportation Needs   Lack of Transportation (Medical): No   Lack of Transportation (Non-Medical): No  Physical Activity: Sufficiently Active   Days of Exercise per Week: 7 days   Minutes of Exercise per Session: 30 min  Stress: No Stress Concern Present   Feeling of Stress : Not at all  Social Connections: Socially Integrated   Frequency of Communication with Friends and Family: More than three times a week   Frequency of Social Gatherings with Friends and Family: More than three times a week   Attends Religious Services: More than 4 times per year   Active Member of [employer] or Organizations: Yes   Attends Music therapist: More than 4 times per year   Marital Status: Married     Family History: The patient's family history includes Alcohol abuse in her father; Alcoholism in her mother; Coronary artery disease in her father; Diabetes in her brother; Hypertension in her brother, father, maternal uncle, and mother; Liver  disease in her father; Obesity in her father; Parkinson's disease in her brother; Stroke in her mother; Sudden death in her mother. There is no history of Colon cancer, Rectal cancer, Stomach cancer, Colon polyps, or Esophageal cancer.  ROS:   Please see the history of present illness.     All other systems reviewed and are negative.  EKGs/Labs/Other Studies Reviewed:    The following studies were reviewed today: ***  EKG:  EKG is *** ordered today.  The ekg ordered today demonstrates ***  Recent Labs: 07/30/2021: Hemoglobin 15.3; Platelets 239.0 10/03/2021: ALT 32; BUN 25; Creatinine, Ser 0.67; Potassium 4.0; Sodium 141; TSH 3.070  Recent Lipid Panel    Component Value Date/Time   CHOL 148 10/03/2021 0806   CHOL 110 11/03/2016 0000   TRIG 71 10/03/2021 0806   TRIG 54 11/03/2016 0000   HDL 58 10/03/2021 0806   HDL 48 (L) 11/03/2016 0000   CHOLHDL 2 07/30/2021 1005   VLDL 9.8 07/30/2021 1005   LDLCALC 76 10/03/2021 0806   LDLCALC 49 11/03/2016 0000     Risk Assessment/Calculations:   {Does this patient have ATRIAL FIBRILLATION?:[phone]}       Physical Exam:    VS:  LMP 02/12/2011     Wt Readings from Last 3 Encounters:  03/20/22 212 lb (96.2 kg)  02/07/22 215 lb (97.5 kg)  02/05/22 211 lb (95.7 kg)     GEN: *** Well nourished, well developed in no acute distress HEENT: Normal NECK: No JVD; No carotid bruits LYMPHATICS: No lymphadenopathy CARDIAC: ***RRR, no murmurs, rubs, gallops RESPIRATORY:  Clear to auscultation without rales, wheezing or rhonchi  ABDOMEN: Soft, non-tender, non-distended MUSCULOSKELETAL:  No edema; No deformity  SKIN: Warm and dry NEUROLOGIC:  Alert and oriented x 3 PSYCHIATRIC:  Normal affect   ASSESSMENT:    1. Paroxysmal atrial fibrillation (HCC)   2. Essential hypertension   3. OSA on CPAP   4. Morbid obesity ([place])    PLAN:    In order of problems listed above:  PAF:   If linq shows a greater burden for afib than the pt  appreciates, then possibly could be a Watchman candidate, if NSAIDS still a concern   HTN:   OSA:   Morbid obesity:   {Are you ordering a CV Procedure (e.g. stress test, cath, DCCV, TEE, etc)?   Press F2        :[id number]}    Medication Adjustments/Labs and Tests Ordered: Current medicines are reviewed at length with the patient today.  Concerns regarding medicines are outlined above.  No orders of the defined types were placed in this encounter.  No orders of the defined types were placed in this encounter.   There are no Patient Instructions on file for this visit.   Signed, Angelena Form, PA-C  03/28/2022 11:52 AM    [facility]

## 2022-03-31 ENCOUNTER — Ambulatory Visit (INDEPENDENT_AMBULATORY_CARE_PROVIDER_SITE_OTHER): Payer: Medicare Other

## 2022-03-31 DIAGNOSIS — I480 Paroxysmal atrial fibrillation: Secondary | ICD-10-CM | POA: Diagnosis not present

## 2022-03-31 LAB — CUP PACEART REMOTE DEVICE CHECK
Date Time Interrogation Session: 20230428230334
Implantable Pulse Generator Implant Date: 20220620

## 2022-04-03 NOTE — Progress Notes (Signed)
? ? ? ?Chief Complaint:  ? ?OBESITY ?Nichole Smith is here to discuss her progress with her obesity treatment plan along with follow-up of her obesity related diagnoses. Nichole Smith is on 1300-1500 calories , 85-100 protein and Journaling and states she is following her eating plan approximately 50% of the time. Nichole Smith states she is walking, using the pool and weights 20 minutes 7 times per week. ? ?Today's visit was #: 35 ?Starting weight: 238 ?Starting date: 01/12/2019 ?Today's weight: 212 ?Today's date: 03/20/2022 ?Total lbs lost to date: 61 ?Total lbs lost since last in-office visit: 0 ? ?Interim History:  ?Nichole Smith is averaging 1400 calories and 100 grams of protein daily.   ?She endorses several days of greater than 2000 calories.   ?She estimates doing  this 6 times since last visit.  ?Protein/calorie increased at last office visit due to worsening polyphagia. ? ?Subjective:  ? ?1. Prediabetes ?Metformin was ineffective for glucose control as patient had GI upset (constipation and nausea).   ?Her insurance will only cover Trulicity for GLP-1 therapy. ?She is currently in Trulicity 3mg  once week- denies mass in neck, dysphagia, dyspepsia, persistent hoarseness, abd pain, pr N/V/Constipation. ? ?2. Vitamin D deficiency ?10/03/21 Vitamin D level was stable at 62.5.   ?She is on over the counter Vitamin D3 but is unsure of dosage. ? ?Assessment/Plan:  ? ?1. Prediabetes ?Will obtain labs on next office visit.   ?At time of next refill will consider increasing Trulicity from 3 mg to 4.5 mg daily. ? ?2. Vitamin D deficiency ?Will obtain labs on next visit.  Continue Vitamin D3 supplement. ? ?3. Obesity BMI today is 42.9 ?Nichole Smith is currently in the action stage of change. As such, her goal is to continue with weight loss efforts. She has agreed to follow the Category 2 plan or food journal 1200-1300 calories and 85-100 proteins daily.  ? ?We will recheck fasting labs at her next office visit. ? ?Exercise goals: As is ? ?Behavioral  modification strategies: increasing lean protein intake, decreasing simple carbohydrates, meal planning and cooking strategies, keeping healthy foods in the home, better snacking choices, and planning for success. ? ?Chaye has agreed to follow-up with our clinic in 6 weeks, fasting. She was informed of the importance of frequent follow-up visits to maximize her success with intensive lifestyle modifications for her multiple health conditions.  ? ?Objective:  ? ?Blood pressure 126/78, pulse 74, temperature 98.3 ?F (36.8 ?C), height 4\' 11"  (1.499 m), weight 212 lb (96.2 kg), last menstrual period 02/12/2011, SpO2 95 %. ?Body mass index is 42.82 kg/m?. ? ?General: Cooperative, alert, well developed, in no acute distress. ?HEENT: Conjunctivae and lids unremarkable. ?Cardiovascular: Regular rhythm.  ?Lungs: Normal work of breathing. ?Neurologic: No focal deficits.  ? ?Lab Results  ?Component Value Date  ? CREATININE 0.67 10/03/2021  ? BUN 25 10/03/2021  ? NA 141 10/03/2021  ? K 4.0 10/03/2021  ? CL 100 10/03/2021  ? CO2 28 10/03/2021  ? ?Lab Results  ?Component Value Date  ? ALT 32 10/03/2021  ? AST 26 10/03/2021  ? ALKPHOS 73 10/03/2021  ? BILITOT 0.5 10/03/2021  ? ?Lab Results  ?Component Value Date  ? HGBA1C 5.5 10/03/2021  ? HGBA1C 5.3 06/25/2021  ? HGBA1C 5.4 06/27/2020  ? HGBA1C 5.5 01/02/2020  ? HGBA1C 5.6 08/22/2019  ? ?Lab Results  ?Component Value Date  ? INSULIN 13.6 10/03/2021  ? INSULIN 12.2 06/25/2021  ? INSULIN 13.7 06/27/2020  ? INSULIN 13.5 01/02/2020  ? INSULIN 21.5 08/22/2019  ? ?  Lab Results  ?Component Value Date  ? TSH 3.070 10/03/2021  ? ?Lab Results  ?Component Value Date  ? CHOL 148 10/03/2021  ? HDL 58 10/03/2021  ? [place] 76 10/03/2021  ? TRIG 71 10/03/2021  ? CHOLHDL 2 07/30/2021  ? ?Lab Results  ?Component Value Date  ? VD25OH 62.5 10/03/2021  ? VD25OH 53.1 06/25/2021  ? VD25OH 51.9 06/27/2020  ? ?Lab Results  ?Component Value Date  ? WBC 8.7 07/30/2021  ? HGB 15.3 (H) 07/30/2021  ? HCT 45.2  07/30/2021  ? MCV 93.9 07/30/2021  ? PLT 239.0 07/30/2021  ? ?No results found for: IRON, TIBC, FERRITIN ? ?Attestation Statements:  ? ?Reviewed by clinician on day of visit: allergies, medications, problem list, medical history, surgical history, family history, social history, and previous encounter notes. ? ? ?I, Althea Charon, am acting as Location manager for Mina Marble, NP. ? ?I have reviewed the above documentation for accuracy and completeness, and I agree with the above. -  Purnell Daigle d. Gizell Danser, NP-C  ?

## 2022-04-09 ENCOUNTER — Ambulatory Visit
Admission: RE | Admit: 2022-04-09 | Discharge: 2022-04-09 | Disposition: A | Discharge status: Home / Self Care | Payer: Medicare Other | Source: Ambulatory Visit | Attending: Family Medicine | Admitting: Family Medicine

## 2022-04-09 DIAGNOSIS — M5416 Radiculopathy, lumbar region: Secondary | ICD-10-CM

## 2022-04-09 MED ORDER — IOPAMIDOL (ISOVUE-M 200) INJECTION 41%
1.0000 mL | Freq: Once | INTRAMUSCULAR | Status: AC
  Administered 2022-04-09: 1 mL via EPIDURAL

## 2022-04-09 MED ORDER — METHYLPREDNISOLONE ACETATE 40 MG/ML INJ SUSP (RADIOLOG
80.0000 mg | Freq: Once | INTRAMUSCULAR | Status: AC
  Administered 2022-04-09: 80 mg via EPIDURAL

## 2022-04-09 NOTE — Discharge Instructions (Signed)

## 2022-04-14 NOTE — Progress Notes (Signed)
[facility] Summary Report / Loop Recorder 

## 2022-05-01 ENCOUNTER — Ambulatory Visit (INDEPENDENT_AMBULATORY_CARE_PROVIDER_SITE_OTHER): Payer: Medicare Other

## 2022-05-01 DIAGNOSIS — I480 Paroxysmal atrial fibrillation: Secondary | ICD-10-CM | POA: Diagnosis not present

## 2022-05-01 LAB — CUP PACEART REMOTE DEVICE CHECK
Date Time Interrogation Session: 20230531230445
Implantable Pulse Generator Implant Date: 20220620

## 2022-05-06 ENCOUNTER — Ambulatory Visit (INDEPENDENT_AMBULATORY_CARE_PROVIDER_SITE_OTHER): Payer: Medicare Other | Admitting: Nurse Practitioner

## 2022-05-07 ENCOUNTER — Encounter: Payer: Self-pay | Admitting: Family Medicine

## 2022-05-07 ENCOUNTER — Ambulatory Visit (INDEPENDENT_AMBULATORY_CARE_PROVIDER_SITE_OTHER): Payer: Medicare Other | Admitting: Family Medicine

## 2022-05-07 VITALS — BP 120/70 | HR 71 | Temp 97.8°F | Wt 213.0 lb

## 2022-05-07 DIAGNOSIS — I2510 Atherosclerotic heart disease of native coronary artery without angina pectoris: Secondary | ICD-10-CM

## 2022-05-07 DIAGNOSIS — I2584 Coronary atherosclerosis due to calcified coronary lesion: Secondary | ICD-10-CM | POA: Diagnosis not present

## 2022-05-07 DIAGNOSIS — B029 Zoster without complications: Secondary | ICD-10-CM | POA: Diagnosis not present

## 2022-05-07 MED ORDER — VALACYCLOVIR HCL 1 G PO TABS: 1000.0000 mg | ORAL_TABLET | Freq: Three times a day (TID) | ORAL | 0 refills | Status: AC

## 2022-05-07 MED ORDER — METHYLPREDNISOLONE 4 MG PO TBPK: ORAL_TABLET | ORAL | 0 refills | Status: DC

## 2022-05-07 NOTE — Progress Notes (Signed)
   Subjective:    Patient ID: Nichole Smith, female    DOB: 09/08/1956, 66 y.o.   MRN: [id number]  HPI Here for 6 days of itching in the right upper back which then extended around to the right armpit. Now she has a burning pain in the armpit. No other symptoms.    Review of Systems  Constitutional: Negative.   Respiratory: Negative.    Cardiovascular:  Positive for chest pain.      Objective:   Physical Exam Constitutional:      General: She is not in acute distress.    Appearance: Normal appearance.  Cardiovascular:     Rate and Rhythm: Normal rate and regular rhythm.     Pulses: Normal pulses.     Heart sounds: Normal heart sounds.  Pulmonary:     Effort: Pulmonary effort is normal.     Breath sounds: Normal breath sounds.  Skin:    Comments: There are the very faint beginnings of 3 or 4 red vesicles on the right upper back  Neurological:     Mental Status: She is alert.          Assessment & Plan:  Shingles. We will treat treat this with 10 days of Valacyclovir and a Medrol dose pack.  Alysia Penna, MD

## 2022-05-07 NOTE — Progress Notes (Deleted)
[facility] Phone: [phone] Subjective:    I'm seeing this patient by the request  of:  Panosh, Standley Brooking, MD  CC:   [id number]  02/07/2022 Secondary to his severe spinal stenosis.  Patient has responded well to injections previously and I do think that if she would respond again to another one at this time.  We discussed icing regimen and home exercises otherwise.  Patient given a prescription for the meloxicam as well as gabapentin.  Patient given new prescriptions today that hopefully will be helpful if necessary.  Patient was to continue with conservative therapy.  Encourage still weight loss.  Patient is working with healthy weight and wellness.  Total time reviewing patient's outside chart including report from neurosurgery, as well as healthy weight and wellness and and discussing with patient and going over imaging greater than 33 minutes.  Updated 05/08/2022 Nichole Smith is a 66 y.o. female coming in with complaint of back pain. Epi on 04/09/2022.       Past Medical History:  Diagnosis Date   Arthritis    knees, back   Bursitis    hips - almost gone per pt    Cancer ([place])    Carcinoid tumor of lung 2012   right found incidentally on chest xray eval for r/o vasxculitis    Carpal tunnel syndrome, bilateral    bilat but surgery to correct    Coronary artery calcification 07/23/2016   Double vision    history, no current problem   Dysrhythmia    irregular occassional, no current problem   Frequent UTI    HNP (herniated nucleus pulposus), cervical 01/22/2011   C6-C7  Has seen dr Vertell Limber for this and Dr Tonita Cong  Positional numbness in hands no weakness     Hypertension    Hypothyroidism    Infertility, female    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  Download 08/2014:  Good compliance, breakthru apnea on 10cm.   Changed to auto 5-12cm >> good control of AHI on f/u download ONO on CPAP:       Osteoarthritis    PCOS (polycystic ovarian syndrome)    Sleep apnea    wears cpap    SUI (stress urinary incontinence, female)    h/o   Past Surgical History:  Procedure Laterality Date   ANTERIOR CERVICAL DECOMP/DISCECTOMY FUSION  12/24/2017   Procedure: Cervical five-six Cervical six-seven Anterior cervical decompression/discectomy/fusion;  Surgeon: Erline Levine, MD;  Location: [facility];  Service: Neurosurgery;;   CARPAL TUNNEL RELEASE Left 12/24/2017   Procedure: LEFT CARPAL TUNNEL RELEASE;  Surgeon: Erline Levine, MD;  Location: [facility];  Service: Neurosurgery;  Laterality: Left;   CESAREAN SECTION     x 1 - twins   COLONOSCOPY     DILATATION & CURRETTAGE/HYSTEROSCOPY WITH RESECTOCOPE N/A 07/18/2013   Procedure: [name];  Surgeon: Lyman Speller, MD;  Location: [facility];  Service: Gynecology;  Laterality: N/A;  mass resection   implantable loop recorder placement  05/20/2021   Medtronic Reveal Linq model [id number] implantable loop recorder (SN [id number])   Lung tumor removed     Rt lung carcinoid   MOUTH SURGERY     pre-cancerous ulcer removed   POLYPECTOMY     SHOULDER OPEN ROTATOR CUFF REPAIR  07/20/2010   Right   WISDOM TOOTH EXTRACTION  Social History   Socioeconomic History   Marital status: Married    Spouse name: [name]   Number of children: 2   Years of education: Not on file   Highest education level: Not on file  Occupational History   Occupation: retired    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   Social Determinants of Health   Financial Resource Strain: Low Risk    Difficulty of Paying Living Expenses: Not hard at all  Food Insecurity: No Food Insecurity   Worried About Charity fundraiser in the Last Year: Never true   [place] in the Last Year: Never true  Transportation Needs: No Transportation Needs   Lack of Transportation (Medical): No   Lack of Transportation (Non-Medical): No  Physical Activity: Sufficiently Active   Days of Exercise per Week: 7 days   Minutes of Exercise per Session: 30 min  Stress: No Stress Concern Present   Feeling of Stress : Not at all  Social Connections: Socially Integrated   Frequency of Communication with Friends and Family: More than three times a week   Frequency of Social Gatherings with Friends and Family: More than three times a week   Attends Religious Services: More than 4 times per year   Active Member of [employer] or Organizations: Yes   Attends Music therapist: More than 4 times per year   Marital Status: Married   Allergies  Allergen Reactions   Neomycin Other (See Comments)    Inflammation--eye drop    Ace Inhibitors Cough   Family History  Problem Relation Age of Onset   Stroke Mother        died age 60   Hypertension Mother    Sudden death Mother    Alcoholism Mother    Coronary artery disease Father        died age 45   Hypertension Father    Liver disease Father    Alcohol abuse Father    Obesity Father    Parkinson's disease Brother    Diabetes Brother    Hypertension Brother    Hypertension Maternal Uncle    Colon cancer Neg Hx    Rectal cancer Neg Hx    Stomach cancer Neg Hx    Colon polyps Neg Hx    Esophageal cancer Neg Hx     Current Outpatient Medications (Endocrine & Metabolic):    Dulaglutide (TRULICITY) 3  ML/4.6TK SOPN, Inject 3 mg as directed once a week.   SYNTHROID 137 MCG tablet, TAKE 1 TABLET DAILY (NEED TO SCHEDULE AN APPOINTMENT FOR MORE REFILLS)  Current Outpatient Medications (Cardiovascular):    atorvastatin (LIPITOR) 40 MG tablet, Take 1 tablet (40 mg total) by mouth daily.   chlorthalidone (HYGROTON) 25 MG tablet, Take 1 tablet (25 mg total) by mouth daily.   diltiazem (CARDIZEM CD)  180 MG 24 hr capsule, Take 1 capsule (180 mg total) by mouth daily.   diltiazem (CARDIZEM) 30 MG tablet, 1 tablet every 6 hours for palpitations   metoprolol succinate (TOPROL-XL) 50 MG 24 hr tablet, Take 1 tablet (50 mg total) by mouth daily.   Current Outpatient Medications (Analgesics):    aspirin EC 81 MG tablet, Take 81 mg by mouth at bedtime.   meloxicam (MOBIC) 7.5 MG tablet, Take 1 tablet (7.5 mg total) by mouth daily.   naproxen sodium (ALEVE) 220 MG tablet, Take 220 mg by mouth 2 (two) times daily as needed (for pain.).   Current Outpatient Medications (Other):    augmented betamethasone dipropionate (DIPROLENE-AF) 0.05 % ointment, USE AS DIRECTED. DO NOT USE MORE THAN 5 DAYS IN A ROW OR MORE THAN 7 DAYS EACH MONTH.   CALCIUM PO, Take 1 tablet by mouth at bedtime.    estradiol (ESTRACE VAGINAL) 0.1 MG/GM vaginal cream, INSERT 1 GRAM VAGINALLY TWICE WEEKLY   gabapentin (NEURONTIN) 100 MG capsule, Take 2 capsules (200 mg total) by mouth 3 (three) times daily.   GEMTESA 75 MG TABS, Take 1 tablet by mouth daily as needed.   Multiple Vitamins-Minerals (PRESERVISION AREDS 2 PO), Take 1 tablet by mouth 2 (two) times daily.   nitrofurantoin, macrocrystal-monohydrate, (MACROBID) 100 MG capsule, Take 1 capsule (100 mg total) by mouth daily.   sulfamethoxazole-trimethoprim (BACTRIM DS) 800-160 MG tablet, Take 1 tablet by mouth 2 (two) times daily.   Reviewed prior external information including notes and imaging from  primary care provider As well as notes that were available from care everywhere  and other healthcare systems.  Past medical history, social, surgical and family history all reviewed in electronic medical record.  No pertanent information unless stated regarding to the chief complaint.   Review of Systems:  No headache, visual changes, nausea, vomiting, diarrhea, constipation, dizziness, abdominal pain, skin rash, fevers, chills, night sweats, weight loss, swollen lymph nodes, body aches, joint swelling, chest pain, shortness of breath, mood changes. POSITIVE muscle aches  Objective  Last menstrual period 02/12/2011.   General: No apparent distress alert and oriented x3 mood and affect normal, dressed appropriately.  HEENT: Pupils equal, extraocular movements intact  Respiratory: Patient's speak in full sentences and does not appear short of breath  Cardiovascular: No lower extremity edema, non tender, no erythema  Gait normal with good balance and coordination.  MSK:  Non tender with full range of motion and good stability and symmetric strength and tone of shoulders, elbows, wrist, hip, knee and ankles bilaterally.     Impression and Recommendations:     The above documentation has been reviewed and is accurate and complete Delsa Sale

## 2022-05-08 ENCOUNTER — Ambulatory Visit: Payer: Medicare Other | Admitting: Family Medicine

## 2022-05-08 ENCOUNTER — Encounter: Payer: Self-pay | Admitting: Family Medicine

## 2022-05-08 NOTE — Telephone Encounter (Signed)
She can go ahead and STOP the steroid, but finish out the Valacyclovir

## 2022-05-09 NOTE — Progress Notes (Signed)
[facility] Summary Report / Loop Recorder 

## 2022-05-13 ENCOUNTER — Encounter (INDEPENDENT_AMBULATORY_CARE_PROVIDER_SITE_OTHER): Payer: Self-pay | Admitting: Nurse Practitioner

## 2022-05-13 ENCOUNTER — Ambulatory Visit (INDEPENDENT_AMBULATORY_CARE_PROVIDER_SITE_OTHER): Payer: Medicare Other | Admitting: Nurse Practitioner

## 2022-05-13 VITALS — BP 133/74 | HR 72 | Temp 97.4°F | Ht 59.0 in | Wt 212.0 lb

## 2022-05-13 DIAGNOSIS — E559 Vitamin D deficiency, unspecified: Secondary | ICD-10-CM | POA: Diagnosis not present

## 2022-05-13 DIAGNOSIS — E669 Obesity, unspecified: Secondary | ICD-10-CM

## 2022-05-13 DIAGNOSIS — Z6841 Body Mass Index (BMI) 40.0 and over, adult: Secondary | ICD-10-CM

## 2022-05-13 DIAGNOSIS — R7303 Prediabetes: Secondary | ICD-10-CM

## 2022-05-13 DIAGNOSIS — E66813 Obesity, class 3: Secondary | ICD-10-CM

## 2022-05-13 DIAGNOSIS — Z7985 Long-term (current) use of injectable non-insulin antidiabetic drugs: Secondary | ICD-10-CM

## 2022-05-13 MED ORDER — TRULICITY 4.5 MG/0.5ML ~~LOC~~ SOAJ: 4.5000 mg | SUBCUTANEOUS | 0 refills | Status: DC

## 2022-05-13 NOTE — Progress Notes (Unsigned)
Nichole Smith [address] [place] Phone: [phone] Subjective:   Nichole Smith, am serving as a scribe for Dr. Hulan Saas.  I'm seeing this patient by the request  of:  Panosh, Standley Brooking, MD  CC: Back pain follow-up  OXB:[id number]  02/07/2022 Secondary to his severe spinal stenosis.  Patient has responded well to injections previously and I do think that if she would respond again to another one at this time.  We discussed icing regimen and home exercises otherwise.  Patient given a prescription for the meloxicam as well as gabapentin.  Patient given new prescriptions today that hopefully will be helpful if necessary.  Patient was to continue with conservative therapy.  Encourage still weight loss.  Patient is working with healthy weight and wellness.  Total time reviewing patient's outside chart including report from neurosurgery, as well as healthy weight and wellness and and discussing with patient and going over imaging greater than 33 minutes.  Updated 05/14/2022 Nichole Smith is a 65 y.o. female coming in with complaint of back pain. Epi on 04/09/2022. Doing well. Epi definitely worked. Hamstring pain still occurs. Not taking gabapentin. Patient was to continue with gabapentin but is no longer taking it at the moment.  Patient does state that she has had no pain on the right side but did have 3 episodes of a 2 to 3-day worsening pain on the left side.  Still able though to do all activities.      Past Medical History:  Diagnosis Date   Arthritis    knees, back   Bursitis    hips - almost gone per pt    Cancer ([place])    Carcinoid tumor of lung 2012   right found incidentally on chest xray eval for r/o vasxculitis    Carpal tunnel syndrome, bilateral    bilat but surgery to correct    Coronary artery calcification 07/23/2016   Double vision    history, no current problem   Dysrhythmia    irregular occassional, no current  problem   Frequent UTI    HNP (herniated nucleus pulposus), cervical 01/22/2011   C6-C7  Has seen dr Vertell Limber for this and Dr Tonita Cong  Positional numbness in hands no weakness     Hypertension    Hypothyroidism    Infertility, female    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  Download 08/2014:  Good compliance, breakthru apnea on 10cm.  Changed to auto 5-12cm >> good control of AHI on f/u download ONO on CPAP:       Osteoarthritis    PCOS (polycystic ovarian syndrome)    Sleep apnea    wears cpap    SUI (stress urinary incontinence, female)    h/o   Past Surgical History:  Procedure Laterality Date   ANTERIOR CERVICAL DECOMP/DISCECTOMY FUSION  12/24/2017   Procedure: Cervical five-six Cervical six-seven Anterior cervical decompression/discectomy/fusion;  Surgeon: Erline Levine, MD;  Location: [facility];  Service: Neurosurgery;;   CARPAL TUNNEL RELEASE Left 12/24/2017   Procedure: LEFT CARPAL TUNNEL RELEASE;  Surgeon: Erline Levine, MD;  Location: [facility];  Service: Neurosurgery;  Laterality: Left;   CESAREAN SECTION     x 1 - twins   COLONOSCOPY     DILATATION & CURRETTAGE/HYSTEROSCOPY WITH RESECTOCOPE N/A 07/18/2013   Procedure: [place];  Surgeon: Lyman Speller, MD;  Location:  [facility];  Service: Gynecology;  Laterality: N/A;  mass resection   implantable loop recorder placement  05/20/2021   Medtronic Reveal Linq model LNQ22 implantable loop recorder (SN [id number])   Lung tumor removed     Rt lung carcinoid   MOUTH SURGERY     pre-cancerous ulcer removed   POLYPECTOMY     SHOULDER OPEN ROTATOR CUFF REPAIR  07/20/2010   Right   WISDOM TOOTH EXTRACTION     Social History   Socioeconomic History   Marital status: Married    Spouse name: [name]   Number of children: 2   Years of education: Not on file   Highest education level: Not on file   Occupational History   Occupation: retired    Fish farm manager: [employer]  Tobacco Use   Smoking status: Never   Smokeless tobacco: Never  Vaping Use   Vaping Use: Never used  Substance and Sexual Activity   Alcohol use: Not Currently   Drug use: No   Sexual activity: Not Currently    Partners: Male    Birth control/protection: Post-menopausal  Other Topics Concern   Not on file  Social History Narrative   hhof 2    2 Children at college and  beyond      [name]    Married    Retired age 52 2015   Recently moved taking care of grandchildren during the week.   Social Determinants of Health   Financial Resource Strain: Low Risk  (10/14/2021)   Overall Financial Resource Strain (CARDIA)    Difficulty of Paying Living Expenses: Not hard at all  Food Insecurity: No Food Insecurity (10/14/2021)   Hunger Vital Sign    Worried About Running Out of Food in the Last Year: Never true    Ran Out of Food in the Last Year: Never true  Transportation Needs: No Transportation Needs (10/14/2021)   PRAPARE - Hydrologist (Medical): No    Lack of Transportation (Non-Medical): No  Physical Activity: Sufficiently Active (10/14/2021)   Exercise Vital Sign    Days of Exercise per Week: 7 days    Minutes of Exercise per Session: 30 min  Stress: No Stress Concern Present (10/14/2021)   [place]    Feeling of Stress : Not at all  Social Connections: [place] (10/14/2021)   Social Connection and Isolation Panel [NHANES]    Frequency of Communication with Friends and Family: More than three times a week    Frequency of Social Gatherings with Friends and Family: More than three times a week    Attends Religious Services: More than 4 times per year    Active Member of [employer] or Organizations: Yes    Attends Music therapist: More than 4 times per year     Marital Status: Married   Allergies  Allergen Reactions   Neomycin Other (See Comments)    Inflammation--eye drop    Ace Inhibitors Cough   Family History  Problem Relation Age of Onset   Stroke Mother        died age 9   Hypertension Mother    Sudden death Mother    Alcoholism Mother    Coronary artery disease Father        died age 54   Hypertension Father    Liver disease Father    Alcohol abuse Father    Obesity Father  Parkinson's disease Brother    Diabetes Brother    Hypertension Brother    Hypertension Maternal Uncle    Colon cancer Neg Hx    Rectal cancer Neg Hx    Stomach cancer Neg Hx    Colon polyps Neg Hx    Esophageal cancer Neg Hx     Current Outpatient Medications (Endocrine & Metabolic):    Dulaglutide (TRULICITY) 4.5 CZ/6.6AY SOPN, Inject 4.5 mg as directed once a week.   methylPREDNISolone (MEDROL DOSEPAK) 4 MG TBPK tablet, As directed   SYNTHROID 137 MCG tablet, TAKE 1 TABLET DAILY (NEED TO SCHEDULE AN APPOINTMENT FOR MORE REFILLS)  Current Outpatient Medications (Cardiovascular):    atorvastatin (LIPITOR) 40 MG tablet, Take 1 tablet (40 mg total) by mouth daily.   chlorthalidone (HYGROTON) 25 MG tablet, Take 1 tablet (25 mg total) by mouth daily.   diltiazem (CARDIZEM CD) 180 MG 24 hr capsule, Take 1 capsule (180 mg total) by mouth daily.   diltiazem (CARDIZEM) 30 MG tablet, 1 tablet every 6 hours for palpitations   metoprolol succinate (TOPROL-XL) 50 MG 24 hr tablet, Take 1 tablet (50 mg total) by mouth daily.   Current Outpatient Medications (Analgesics):    aspirin EC 81 MG tablet, Take 81 mg by mouth at bedtime.   naproxen sodium (ALEVE) 220 MG tablet, Take 220 mg by mouth 2 (two) times daily as needed (for pain.).   Current Outpatient Medications (Other):    augmented betamethasone dipropionate (DIPROLENE-AF) 0.05 % ointment, USE AS DIRECTED. DO NOT USE MORE THAN 5 DAYS IN A ROW OR MORE THAN 7 DAYS EACH MONTH.   CALCIUM PO, Take 1  tablet by mouth at bedtime.    estradiol (ESTRACE VAGINAL) 0.1 MG/GM vaginal cream, INSERT 1 GRAM VAGINALLY TWICE WEEKLY   gabapentin (NEURONTIN) 100 MG capsule, Take 2 capsules (200 mg total) by mouth 3 (three) times daily.   GEMTESA 75 MG TABS, Take 1 tablet by mouth daily as needed.   Multiple Vitamins-Minerals (PRESERVISION AREDS 2 PO), Take 1 tablet by mouth 2 (two) times daily.   nitrofurantoin, macrocrystal-monohydrate, (MACROBID) 100 MG capsule, Take 1 capsule (100 mg total) by mouth daily.   valACYclovir (VALTREX) 1000 MG tablet, Take 1 tablet (1,000 mg total) by mouth 3 (three) times daily for 10 days.   Reviewed prior external information including notes and imaging from  primary care provider As well as notes that were available from care everywhere and other healthcare systems.  Reviewed epidural pictures.  Past medical history, social, surgical and family history all reviewed in electronic medical record.  No pertanent information unless stated regarding to the chief complaint.   Review of Systems:  No headache, visual changes, nausea, vomiting, diarrhea, constipation, dizziness, abdominal pain, skin rash, fevers, chills, night sweats, weight loss, swollen lymph nodes, body aches, joint swelling, chest pain, shortness of breath, mood changes. POSITIVE muscle aches but improved  Objective  Blood pressure 130/86, pulse 83, height 4\' 11"  (1.499 m), weight 218 lb (98.9 kg), last menstrual period 02/12/2011.   General: No apparent distress alert and oriented x3 mood and affect normal, dressed appropriately.  HEENT: Pupils equal, extraocular movements intact  Respiratory: Patient's speak in full sentences and does not appear short of breath  Cardiovascular: No lower extremity edema, non tender, no erythema  Patient does have very mild tightness noted of the right side of the hamstring and the right side of the calf.  Patient does not have any significant numbness still on exam.  Negative straight leg test with once again still has the tightness noted.     Impression and Recommendations:     The above documentation has been reviewed and is accurate and complete Lyndal Pulley, DO

## 2022-05-14 ENCOUNTER — Ambulatory Visit (INDEPENDENT_AMBULATORY_CARE_PROVIDER_SITE_OTHER): Payer: Medicare Other | Admitting: Family Medicine

## 2022-05-14 DIAGNOSIS — I2510 Atherosclerotic heart disease of native coronary artery without angina pectoris: Secondary | ICD-10-CM

## 2022-05-14 DIAGNOSIS — I2584 Coronary atherosclerosis due to calcified coronary lesion: Secondary | ICD-10-CM | POA: Diagnosis not present

## 2022-05-14 DIAGNOSIS — M5416 Radiculopathy, lumbar region: Secondary | ICD-10-CM

## 2022-05-14 LAB — COMPREHENSIVE METABOLIC PANEL
ALT: 34 IU/L — ABNORMAL HIGH (ref 0–32)
AST: 26 IU/L (ref 0–40)
Albumin/Globulin Ratio: 1.4 (ref 1.2–2.2)
Albumin: 3.9 g/dL (ref 3.8–4.8)
Alkaline Phosphatase: 67 IU/L (ref 44–121)
BUN/Creatinine Ratio: 34 — ABNORMAL HIGH (ref 12–28)
BUN: 21 mg/dL (ref 8–27)
Bilirubin Total: 0.6 mg/dL (ref 0.0–1.2)
CO2: 24 mmol/L (ref 20–29)
Calcium: 9.8 mg/dL (ref 8.7–10.3)
Chloride: 99 mmol/L (ref 96–106)
Creatinine, Ser: 0.62 mg/dL (ref 0.57–1.00)
Globulin, Total: 2.7 g/dL (ref 1.5–4.5)
Glucose: 76 mg/dL (ref 70–99)
Potassium: 3.7 mmol/L (ref 3.5–5.2)
Sodium: 142 mmol/L (ref 134–144)
Total Protein: 6.6 g/dL (ref 6.0–8.5)
eGFR: 98 mL/min/{1.73_m2} (ref >59)

## 2022-05-14 LAB — INSULIN, RANDOM: INSULIN: 12.2 u[IU]/mL (ref 2.6–24.9)

## 2022-05-14 LAB — HEMOGLOBIN A1C
Est. average glucose Bld gHb Est-mCnc: 111 mg/dL
Hgb A1c MFr Bld: 5.5 % (ref 4.8–5.6)

## 2022-05-14 NOTE — Progress Notes (Signed)
Chief Complaint:   OBESITY Nichole Smith is here to discuss her progress with her obesity treatment plan along with follow-up of her obesity related diagnoses. Nichole Smith is on the Category 2 Plan and states she is following her eating plan approximately 50% of the time. Nichole Smith states she is walking 20 minutes 7 times per week.  Today's visit was #: 61 Starting weight: 238 lbs Starting date: 01/12/2019 Today's weight: 212 lbs Today's date: 05/13/2022 Total lbs lost to date: 26 lbs Total lbs lost since last in-office visit: 0  Interim History: Nichole Smith feels she's "doing up and down". Struggling with Shingles since her last visit. Recently finished a prednisone dose pak. She went to [facility]. She stopped journaling and doesn't like to journal especially if she had or has a bad day. Drinking water coffee, and sodas. She does not want to be too restrictive. Would like to be able to still have ice cream with her grand children. Does not like Cat 2 plan. Concerned that it doesn't meet her required protein goals.  Reports always hunger and cravings.  Subjective:   1. Prediabetes Nichole Smith is currently taking Trulicity 3 mg. Reports some side effects of constipation. She is struggling with hunger and cravings. Asking to increase Trulicity dose.  Has discussed increasing dose in the past.   2. Vitamin D deficiency Nichole Smith is taking over the counter Vitamin D. She is unsure of dose.  Assessment/Plan:   1. Prediabetes We will obtain labs today.  Nichole Smith will continue to work on weight loss, exercise, and decreasing simple carbohydrates to help decrease the risk of diabetes.    - Comprehensive metabolic panel - Insulin, random - Hemoglobin A1c  We will Increase Trulicity to 4.5 mg SubQ once weekly for 1 month with 0 refills.  SE discussed.   -Increase Dulaglutide (TRULICITY) 4.5 WL/7.9GX SOPN; Inject 4.5 mg as directed once a week.  Dispense: 6 mL; Refill: 0  2. Vitamin D deficiency We will obtain labs  today.  Low Vitamin D level contributes to fatigue and are associated with obesity, breast, and colon cancer. She agrees to continue to take prescription Vitamin D @50 ,000 IU every week and will follow-up for routine testing of Vitamin D, at least 2-3 times per year to avoid over-replacement.   - VITAMIN D 25 Hydroxy (Vit-D Deficiency, Fractures)  3. Obesity BMI today is 43.0 Nichole Smith is currently in the action stage of change. As such, her goal is to continue with weight loss efforts. She has agreed to keeping a food journal and adhering to recommended goals of 1300-1500 calories and 85+ grams of protein. I've asked her to journal/track especially her protein and calories and I will review with her at her next visit.    Exercise goals: As is.  Behavioral modification strategies: increasing lean protein intake, increasing water intake, and planning for success.  Nichole Smith has agreed to follow-up with our clinic in 4 weeks. She was informed of the importance of frequent follow-up visits to maximize her success with intensive lifestyle modifications for her multiple health conditions.   Nichole Smith was informed we would discuss her lab results at her next visit unless there is a critical issue that needs to be addressed sooner. Nichole Smith agreed to keep her next visit at the agreed upon time to discuss these results.  Objective:   Blood pressure 133/74, pulse 72, temperature (!) 97.4 F (36.3 C), height 4\' 11"  (1.499 m), weight 212 lb (96.2 kg), last menstrual period 02/12/2011, SpO2 96 %.  Body mass index is 42.82 kg/m.  General: Cooperative, alert, well developed, in no acute distress. HEENT: Conjunctivae and lids unremarkable. Cardiovascular: Regular rhythm.  Lungs: Normal work of breathing. Neurologic: No focal deficits.   Lab Results  Component Value Date   CREATININE 0.62 05/13/2022   BUN 21 05/13/2022   NA 142 05/13/2022   K 3.7 05/13/2022   CL 99 05/13/2022   CO2 24 05/13/2022   Lab Results   Component Value Date   ALT 34 (H) 05/13/2022   AST 26 05/13/2022   ALKPHOS 67 05/13/2022   BILITOT 0.6 05/13/2022   Lab Results  Component Value Date   HGBA1C 5.5 05/13/2022   HGBA1C 5.5 10/03/2021   HGBA1C 5.3 06/25/2021   HGBA1C 5.4 06/27/2020   HGBA1C 5.5 01/02/2020   Lab Results  Component Value Date   INSULIN 12.2 05/13/2022   INSULIN 13.6 10/03/2021   INSULIN 12.2 06/25/2021   INSULIN 13.7 06/27/2020   INSULIN 13.5 01/02/2020   Lab Results  Component Value Date   TSH 3.070 10/03/2021   Lab Results  Component Value Date   CHOL 148 10/03/2021   HDL 58 10/03/2021   LDLCALC 76 10/03/2021   TRIG 71 10/03/2021   CHOLHDL 2 07/30/2021   Lab Results  Component Value Date   VD25OH 62.5 10/03/2021   VD25OH 53.1 06/25/2021   VD25OH 51.9 06/27/2020   Lab Results  Component Value Date   WBC 8.7 07/30/2021   HGB 15.3 (H) 07/30/2021   HCT 45.2 07/30/2021   MCV 93.9 07/30/2021   PLT 239.0 07/30/2021   No results found for: "IRON", "TIBC", "FERRITIN"  Attestation Statements:   Reviewed by clinician on day of visit: allergies, medications, problem list, medical history, surgical history, family history, social history, and previous encounter notes.  I, Brendell Tyus, RMA, am acting as transcriptionist for Everardo Pacific, FNP..  I have reviewed the above documentation for accuracy and completeness, and I agree with the above. Everardo Pacific, FNP

## 2022-05-14 NOTE — Patient Instructions (Signed)
Good to see you! Try heel lifts for a week If worsening pain see me sooner See you again in 3 months just in case

## 2022-05-14 NOTE — Assessment & Plan Note (Signed)
Severe spinal stenosis.  Patient has had some intermittent pain on the left side of her back as well that did not cause her to be down for approximately 3 days.  Patient now has responded to the injection at the moment.  Discussed which activities to do and which ones to avoid.  Increase activity slowly otherwise.  Follow-up with me again in 2 to 3 months.  Patient was having some tightness in the right calf and we will monitor.  Worsening pain we will further evaluate the patient will continue with the gabapentin and given some heel lifts to try

## 2022-05-21 LAB — SPECIMEN STATUS REPORT

## 2022-05-21 LAB — VITAMIN D 25 HYDROXY (VIT D DEFICIENCY, FRACTURES): Vit D, 25-Hydroxy: 42.3 ng/mL (ref 30.0–100.0)

## 2022-06-04 ENCOUNTER — Ambulatory Visit (INDEPENDENT_AMBULATORY_CARE_PROVIDER_SITE_OTHER): Payer: Medicare Other

## 2022-06-04 DIAGNOSIS — I480 Paroxysmal atrial fibrillation: Secondary | ICD-10-CM

## 2022-06-05 ENCOUNTER — Encounter (HOSPITAL_BASED_OUTPATIENT_CLINIC_OR_DEPARTMENT_OTHER): Payer: Self-pay | Admitting: Obstetrics & Gynecology

## 2022-06-05 LAB — CUP PACEART REMOTE DEVICE CHECK
Date Time Interrogation Session: 20230703230414
Implantable Pulse Generator Implant Date: 20220620

## 2022-06-08 NOTE — Progress Notes (Unsigned)
No chief complaint on file.   HPI: Nichole Smith 66 y.o. come in for Chronic disease management  Last visit with pcp was 72 22   Had shingles in June 23  goven pred and rx  PAF  under care  PREDIABETES  and weight management  ROS: See pertinent positives and negatives per HPI.  Past Medical History:  Diagnosis Date   Arthritis    knees, back   Bursitis    hips - almost gone per pt    Cancer ([place])    Carcinoid tumor of lung 2012   right found incidentally on chest xray eval for r/o vasxculitis    Carpal tunnel syndrome, bilateral    bilat but surgery to correct    Coronary artery calcification 07/23/2016   Double vision    history, no current problem   Dysrhythmia    irregular occassional, no current problem   Frequent UTI    HNP (herniated nucleus pulposus), cervical 01/22/2011   C6-C7  Has seen dr Vertell Limber for this and Dr Tonita Cong  Positional numbness in hands no weakness     Hypertension    Hypothyroidism    Infertility, female    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  Download 08/2014:  Good compliance, breakthru apnea on 10cm.  Changed to auto 5-12cm >> good control of AHI on f/u download ONO on CPAP:       Osteoarthritis    PCOS (polycystic ovarian syndrome)    Sleep apnea    wears cpap    SUI (stress urinary incontinence, female)    h/o    Family History  Problem Relation Age of Onset   Stroke Mother        died age 39   Hypertension Mother    Sudden death Mother    Alcoholism Mother    Coronary artery disease Father        died age 6   Hypertension Father    Liver disease Father    Alcohol abuse Father    Obesity Father    Parkinson's disease Brother    Diabetes Brother    Hypertension Brother    Hypertension Maternal Uncle    Colon cancer Neg Hx    Rectal cancer Neg Hx    Stomach cancer Neg Hx    Colon polyps Neg Hx    Esophageal cancer Neg Hx     Social  History   Socioeconomic History   Marital status: Married    Spouse name: [name]   Number of children: 2   Years of education: Not on file   Highest education level: Bachelor's degree (e.g., BA, AB, BS)  Occupational History   Occupation: retired    Fish farm manager: [employer]  Tobacco Use   Smoking status: Never   Smokeless tobacco: Never  Vaping Use   Vaping Use: Never used  Substance and Sexual Activity   Alcohol use: Not Currently   Drug use: No   Sexual activity: Not Currently    Partners: Male    Birth control/protection: Post-menopausal  Other Topics Concern   Not on file  Social History Narrative   hhof 2    2 Children at college and  beyond      [name]    Married    Retired age 60 2015   Recently moved taking care of grandchildren during the week.   Social Determinants  of Health   Financial Resource Strain: Low Risk  (06/05/2022)   Overall Financial Resource Strain (CARDIA)    Difficulty of Paying Living Expenses: Not hard at all  Food Insecurity: No Food Insecurity (06/05/2022)   Hunger Vital Sign    Worried About Running Out of Food in the Last Year: Never true    Ran Out of Food in the Last Year: Never true  Transportation Needs: No Transportation Needs (06/05/2022)   PRAPARE - Hydrologist (Medical): No    Lack of Transportation (Non-Medical): No  Physical Activity: Sufficiently Active (06/05/2022)   Exercise Vital Sign    Days of Exercise per Week: 5 days    Minutes of Exercise per Session: 30 min  Stress: No Stress Concern Present (06/05/2022)   [place]    Feeling of Stress : Only a little  Social Connections: Socially Integrated (06/05/2022)   Social Connection and Isolation Panel [NHANES]    Frequency of Communication with Friends and Family: Twice a week    Frequency of Social Gatherings with Friends and Family: Once a week    Attends  Religious Services: More than 4 times per year    Active Member of [employer] or Organizations: Yes    Attends Music therapist: More than 4 times per year    Marital Status: Married    Outpatient Medications Prior to Visit  Medication Sig Dispense Refill   aspirin EC 81 MG tablet Take 81 mg by mouth at bedtime.     atorvastatin (LIPITOR) 40 MG tablet Take 1 tablet (40 mg total) by mouth daily. 90 tablet 3   augmented betamethasone dipropionate (DIPROLENE-AF) 0.05 % ointment USE AS DIRECTED. DO NOT USE MORE THAN 5 DAYS IN A ROW OR MORE THAN 7 DAYS EACH MONTH. 45 g 0   CALCIUM PO Take 1 tablet by mouth at bedtime.      chlorthalidone (HYGROTON) 25 MG tablet Take 1 tablet (25 mg total) by mouth daily. 90 tablet 3   diltiazem (CARDIZEM CD) 180 MG 24 hr capsule Take 1 capsule (180 mg total) by mouth daily. 90 capsule 3   diltiazem (CARDIZEM) 30 MG tablet 1 tablet every 6 hours for palpitations 60 tablet 3   Dulaglutide (TRULICITY) 4.5 IH/4.7QQ SOPN Inject 4.5 mg as directed once a week. 6 mL 0   estradiol (ESTRACE VAGINAL) 0.1 MG/GM vaginal cream INSERT 1 GRAM VAGINALLY TWICE WEEKLY 42.5 g 3   gabapentin (NEURONTIN) 100 MG capsule Take 2 capsules (200 mg total) by mouth 3 (three) times daily. 180 capsule 2   GEMTESA 75 MG TABS Take 1 tablet by mouth daily as needed.     methylPREDNISolone (MEDROL DOSEPAK) 4 MG TBPK tablet As directed 21 tablet 0   metoprolol succinate (TOPROL-XL) 50 MG 24 hr tablet Take 1 tablet (50 mg total) by mouth daily. 90 tablet 3   Multiple Vitamins-Minerals (PRESERVISION AREDS 2 PO) Take 1 tablet by mouth 2 (two) times daily.     naproxen sodium (ALEVE) 220 MG tablet Take 220 mg by mouth 2 (two) times daily as needed (for pain.).     nitrofurantoin, macrocrystal-monohydrate, (MACROBID) 100 MG capsule Take 1 capsule (100 mg total) by mouth daily. 90 capsule 3   SYNTHROID 137 MCG tablet TAKE 1 TABLET DAILY (NEED TO SCHEDULE AN APPOINTMENT FOR MORE REFILLS) 90 tablet  3   No facility-administered medications prior to visit.     EXAM:  LMP 02/12/2011   There is no height or weight on file to calculate BMI.  GENERAL: vitals reviewed and listed above, alert, oriented, appears well hydrated and in no acute distress HEENT: atraumatic, conjunctiva  clear, no obvious abnormalities on inspection of external nose and ears OP : no lesion edema or exudate  NECK: no obvious masses on inspection palpation  LUNGS: clear to auscultation bilaterally, no wheezes, rales or rhonchi, good air movement CV: HRRR, no clubbing cyanosis or  peripheral edema nl cap refill  MS: moves all extremities without noticeable focal  abnormality PSYCH: pleasant and cooperative, no obvious depression or anxiety Lab Results  Component Value Date   WBC 8.7 07/30/2021   HGB 15.3 (H) 07/30/2021   HCT 45.2 07/30/2021   PLT 239.0 07/30/2021   GLUCOSE 76 05/13/2022   CHOL 148 10/03/2021   TRIG 71 10/03/2021   HDL 58 10/03/2021   LDLCALC 76 10/03/2021   ALT 34 (H) 05/13/2022   AST 26 05/13/2022   NA 142 05/13/2022   K 3.7 05/13/2022   CL 99 05/13/2022   CREATININE 0.62 05/13/2022   BUN 21 05/13/2022   CO2 24 05/13/2022   TSH 3.070 10/03/2021   INR 0.96 06/11/2011   HGBA1C 5.5 05/13/2022   BP Readings from Last 3 Encounters:  05/14/22 130/86  05/13/22 133/74  05/07/22 120/70    ASSESSMENT AND PLAN:  Discussed the following assessment and plan:  No diagnosis found. Hg 15.2 -Patient advised to return or notify health care team  if  new concerns arise.  There are no Patient Instructions on file for this visit.   Standley Brooking. Kealani Leckey M.D.

## 2022-06-09 ENCOUNTER — Encounter: Payer: Self-pay | Admitting: Internal Medicine

## 2022-06-09 ENCOUNTER — Ambulatory Visit (INDEPENDENT_AMBULATORY_CARE_PROVIDER_SITE_OTHER): Payer: Medicare Other | Admitting: Internal Medicine

## 2022-06-09 VITALS — BP 128/78 | HR 72 | Temp 98.9°F | Ht 59.0 in | Wt 216.8 lb

## 2022-06-09 DIAGNOSIS — R053 Chronic cough: Secondary | ICD-10-CM

## 2022-06-09 DIAGNOSIS — Z85110 Personal history of malignant carcinoid tumor of bronchus and lung: Secondary | ICD-10-CM

## 2022-06-09 DIAGNOSIS — I2584 Coronary atherosclerosis due to calcified coronary lesion: Secondary | ICD-10-CM

## 2022-06-09 DIAGNOSIS — E7849 Other hyperlipidemia: Secondary | ICD-10-CM

## 2022-06-09 DIAGNOSIS — Z6841 Body Mass Index (BMI) 40.0 and over, adult: Secondary | ICD-10-CM

## 2022-06-09 DIAGNOSIS — R7303 Prediabetes: Secondary | ICD-10-CM

## 2022-06-09 DIAGNOSIS — E038 Other specified hypothyroidism: Secondary | ICD-10-CM

## 2022-06-09 DIAGNOSIS — I2510 Atherosclerotic heart disease of native coronary artery without angina pectoris: Secondary | ICD-10-CM

## 2022-06-09 DIAGNOSIS — Z79899 Other long term (current) drug therapy: Secondary | ICD-10-CM

## 2022-06-09 MED ORDER — HYDROCODONE BIT-HOMATROP MBR 5-1.5 MG/5ML PO SOLN: 5.0000 mL | Freq: Three times a day (TID) | ORAL | 0 refills | Status: DC | PRN

## 2022-06-09 NOTE — Patient Instructions (Addendum)
Will  order for future c xray .  Will order  hydrocodone cough med as needed at night for now Trial of  acid blocker for 2 months to see if resolves and then let me know.  Prilosec ok  Due for labs no later than Nov . I will place orders .

## 2022-06-10 ENCOUNTER — Encounter: Payer: Self-pay | Admitting: Internal Medicine

## 2022-06-10 MED ORDER — HYDROCODONE BIT-HOMATROP MBR 5-1.5 MG/5ML PO SOLN: 5.0000 mL | Freq: Three times a day (TID) | ORAL | 0 refills | Status: DC | PRN

## 2022-06-10 NOTE — Telephone Encounter (Signed)
Request sent to PCP

## 2022-06-10 NOTE — Addendum Note (Signed)
Addended byShanon Ace K on: 06/10/2022 04:27 PM   Modules accepted: Orders

## 2022-06-11 NOTE — Progress Notes (Signed)
Rx received at pharmacy

## 2022-06-12 ENCOUNTER — Ambulatory Visit (INDEPENDENT_AMBULATORY_CARE_PROVIDER_SITE_OTHER): Payer: Medicare Other | Admitting: *Deleted

## 2022-06-12 VITALS — BP 123/70 | HR 62

## 2022-06-12 DIAGNOSIS — M810 Age-related osteoporosis without current pathological fracture: Secondary | ICD-10-CM | POA: Diagnosis not present

## 2022-06-12 MED ORDER — DENOSUMAB 60 MG/ML ~~LOC~~ SOSY
60.0000 mg | PREFILLED_SYRINGE | Freq: Once | SUBCUTANEOUS | Status: AC
  Administered 2022-06-12: 60 mg via SUBCUTANEOUS

## 2022-06-12 NOTE — Progress Notes (Signed)
Pt here for Prolia injection. Tolerated well. Advised to return in 6 months and one day for next injection

## 2022-06-17 ENCOUNTER — Ambulatory Visit (INDEPENDENT_AMBULATORY_CARE_PROVIDER_SITE_OTHER): Payer: Medicare Other | Admitting: Nurse Practitioner

## 2022-06-17 VITALS — BP 123/76 | HR 73 | Temp 98.1°F | Ht 59.0 in | Wt 210.0 lb

## 2022-06-17 DIAGNOSIS — M810 Age-related osteoporosis without current pathological fracture: Secondary | ICD-10-CM

## 2022-06-17 DIAGNOSIS — R7303 Prediabetes: Secondary | ICD-10-CM

## 2022-06-17 DIAGNOSIS — E669 Obesity, unspecified: Secondary | ICD-10-CM | POA: Diagnosis not present

## 2022-06-17 DIAGNOSIS — Z6841 Body Mass Index (BMI) 40.0 and over, adult: Secondary | ICD-10-CM

## 2022-06-19 NOTE — Progress Notes (Signed)
Chief Complaint:   OBESITY Nichole Smith is here to discuss her progress with her obesity treatment plan along with follow-up of her obesity related diagnoses. Nichole Smith is on the Category 2 Plan and states she is following her eating plan approximately 75% of the time. Nichole Smith states she is walking and strength training 30 minutes 7 times per week.  Today's visit was #: 5 Starting weight: 238 lbs Starting date: 01/12/2019 Today's weight: 210 lbs Today's date: 06/17/2022 Total lbs lost to date: 28 lbs Total lbs lost since last in-office visit: 2  Interim History: Nichole Smith has done well since her last visit. She is pre planning/meal planning her day. Calories: 1500, Protein: 80->100. She is aiming to get in more protein in especially early in the day. Last week she got new walking shoes. She is struggling with back, leg and foot pain on the right. Seen neurosurgeon. Eats out 2-3 times per week.  Subjective:   1. Prediabetes Labs discussed during visit today. Nichole Smith's Trulicity was increased to 4.5 mg. Denies any side effects. Denies hypoglycemia. Reports hunger and cravings but not as bad.  2. Osteoporosis, unspecified osteoporosis type, unspecified pathological fracture presence Nichole Smith had 2 Prolia injections. Her last Dexa was 08/22/21.  Assessment/Plan:   1. Prediabetes Nichole Smith will continue taking Trulicity 4.5 mg. Side effects discussed.   Nichole Smith will continue to work on weight loss, exercise, and decreasing simple carbohydrates to help decrease the risk of diabetes.    2. Osteoporosis, unspecified osteoporosis type, unspecified pathological fracture presence Nichole Smith will continue to follow up with PCP and continue medications as directed.  3. Obesity BMI today is 42.6 Nichole Smith is currently in the action stage of change. As such, her goal is to continue with weight loss efforts. She has agreed to keeping a food journal and adhering to recommended goals of 1500 calories and 90+ grams of protein.    Labs discussed during visit today.  Exercise goals: As is.  Behavioral modification strategies: increasing lean protein intake, increasing water intake, and planning for success.  Nichole Smith has agreed to follow-up with our clinic in 6 weeks. She was informed of the importance of frequent follow-up visits to maximize her success with intensive lifestyle modifications for her multiple health conditions.   Objective:   Blood pressure 123/76, pulse 73, temperature 98.1 F (36.7 C), height 4\' 11"  (1.499 m), weight 210 lb (95.3 kg), last menstrual period 02/12/2011, SpO2 92 %. Body mass index is 42.41 kg/m.  General: Cooperative, alert, well developed, in no acute distress. HEENT: Conjunctivae and lids unremarkable. Cardiovascular: Regular rhythm.  Lungs: Normal work of breathing. Neurologic: No focal deficits.   Lab Results  Component Value Date   CREATININE 0.62 05/13/2022   BUN 21 05/13/2022   NA 142 05/13/2022   K 3.7 05/13/2022   CL 99 05/13/2022   CO2 24 05/13/2022   Lab Results  Component Value Date   ALT 34 (H) 05/13/2022   AST 26 05/13/2022   ALKPHOS 67 05/13/2022   BILITOT 0.6 05/13/2022   Lab Results  Component Value Date   HGBA1C 5.5 05/13/2022   HGBA1C 5.5 10/03/2021   HGBA1C 5.3 06/25/2021   HGBA1C 5.4 06/27/2020   HGBA1C 5.5 01/02/2020   Lab Results  Component Value Date   INSULIN 12.2 05/13/2022   INSULIN 13.6 10/03/2021   INSULIN 12.2 06/25/2021   INSULIN 13.7 06/27/2020   INSULIN 13.5 01/02/2020   Lab Results  Component Value Date   TSH 3.070 10/03/2021  Lab Results  Component Value Date   CHOL 148 10/03/2021   HDL 58 10/03/2021   LDLCALC 76 10/03/2021   TRIG 71 10/03/2021   CHOLHDL 2 07/30/2021   Lab Results  Component Value Date   VD25OH 42.3 05/13/2022   VD25OH 62.5 10/03/2021   VD25OH 53.1 06/25/2021   Lab Results  Component Value Date   WBC 8.7 07/30/2021   HGB 15.3 (H) 07/30/2021   HCT 45.2 07/30/2021   MCV 93.9 07/30/2021    PLT 239.0 07/30/2021   No results found for: "IRON", "TIBC", "FERRITIN"  Attestation Statements:   Reviewed by clinician on day of visit: allergies, medications, problem list, medical history, surgical history, family history, social history, and previous encounter notes.  Spent 30 minutes with the patient and reviewing her chart before and after visit.   I, Brendell Tyus, RMA, am acting as transcriptionist for Everardo Pacific, FNP.  I have reviewed the above documentation for accuracy and completeness, and I agree with the above. Everardo Pacific, FNP

## 2022-06-22 ENCOUNTER — Other Ambulatory Visit (HOSPITAL_BASED_OUTPATIENT_CLINIC_OR_DEPARTMENT_OTHER): Payer: Self-pay | Admitting: Obstetrics & Gynecology

## 2022-06-22 DIAGNOSIS — R399 Unspecified symptoms and signs involving the genitourinary system: Secondary | ICD-10-CM

## 2022-06-26 NOTE — Progress Notes (Signed)
[facility] Summary Report / Loop Recorder 

## 2022-07-07 ENCOUNTER — Ambulatory Visit (INDEPENDENT_AMBULATORY_CARE_PROVIDER_SITE_OTHER): Payer: Medicare Other

## 2022-07-07 DIAGNOSIS — I480 Paroxysmal atrial fibrillation: Secondary | ICD-10-CM

## 2022-07-07 LAB — CUP PACEART REMOTE DEVICE CHECK
Date Time Interrogation Session: 20230805231137
Implantable Pulse Generator Implant Date: 20220620

## 2022-07-09 ENCOUNTER — Encounter (INDEPENDENT_AMBULATORY_CARE_PROVIDER_SITE_OTHER): Payer: Self-pay

## 2022-07-10 ENCOUNTER — Ambulatory Visit: Payer: Medicare Other | Admitting: Adult Health

## 2022-07-18 ENCOUNTER — Ambulatory Visit (INDEPENDENT_AMBULATORY_CARE_PROVIDER_SITE_OTHER): Payer: Medicare Other | Admitting: Adult Health

## 2022-07-18 ENCOUNTER — Encounter: Payer: Self-pay | Admitting: Adult Health

## 2022-07-18 VITALS — BP 110/80 | HR 70 | Temp 98.2°F | Ht 59.0 in | Wt 218.2 lb

## 2022-07-18 DIAGNOSIS — G4733 Obstructive sleep apnea (adult) (pediatric): Secondary | ICD-10-CM | POA: Diagnosis not present

## 2022-07-18 DIAGNOSIS — I2510 Atherosclerotic heart disease of native coronary artery without angina pectoris: Secondary | ICD-10-CM

## 2022-07-18 DIAGNOSIS — I2584 Coronary atherosclerosis due to calcified coronary lesion: Secondary | ICD-10-CM

## 2022-07-18 DIAGNOSIS — Z6841 Body Mass Index (BMI) 40.0 and over, adult: Secondary | ICD-10-CM | POA: Diagnosis not present

## 2022-07-18 NOTE — Progress Notes (Signed)
@Patient  ID: Nichole Smith, female    DOB: 1956/01/14, 66 y.o.   MRN: [id number]  Chief Complaint  Patient presents with   Follow-up    Referring provider: Burnis Medin, MD  HPI: 66 year old female followed for obstructive sleep apnea Medical history significant for leukocytoclastic vasculitis, hypertension, carcinoid tumor of the lung in 2012  TEST/EVENTS :  PSG 07/10/14 >> AHI 85   ONO with CPAP 06/08/18 >> test time 8 hrs 42 min.  Basal SpO2 93.6%, low SpO2 81%.  Spent 4.3 min with SpO2 < 88%.  07/18/2022 Follow up : OSA  Patient presents for 1 year follow-up.  Patient has underlying severe obstructive sleep apnea.  She remains on nocturnal CPAP.  Says she is doing very well.  Machine is about a little over a-year-old.  She says it is working very well.  She feels that she benefits from her CPAP with decreased daytime sleepiness.  Patient says she wears her CPAP every single night.  Cannot sleep without it.  She does notice that the current mask she has which is a nasal mask is causing some irritation and pressure on the right side of her nose.  She would like to change mask.  We discussed the DreamWear nasal mask.  She would like to try this. CPAP download shows excellent compliance with 100% usage.  Daily average usage at 8 hours.  Patient is on CPAP 16 cm H2O.  AHI 0.9/hour.  Positive air leaks.     Allergies  Allergen Reactions   Neomycin Other (See Comments)    Inflammation--eye drop    Ace Inhibitors Cough    Immunization History  Administered Date(s) Administered   Fluad Quad(high Dose 65+) 09/21/2019, 09/10/2020   Influenza Split 10/17/2011, 08/12/2012, 08/01/2013   Influenza, Quadrivalent, Recombinant, Inj, Pf 08/12/2018   Influenza,inj,Quad PF,6+ Mos 09/04/2014, 08/27/2015, 08/01/2016, 08/10/2017   Influenza-Unspecified 08/30/2018, 08/31/2021   Moderna SARS-COV2 Booster Vaccination 08/31/2021, 03/31/2022   Moderna Sars-Covid-2 Vaccination 01/27/2020,  09/22/2020, 12/15/2020, 03/12/2021   Pneumococcal Conjugate-13 10/05/2019   Pneumococcal Polysaccharide-23 09/10/2020   Td 12/01/1997, 10/16/2008   Tdap 01/30/2014   Zoster Recombinat (Shingrix) 01/12/2017, 04/02/2017   Zoster, Live 05/25/2012    Past Medical History:  Diagnosis Date   Arthritis    knees, back   Bursitis    hips - almost gone per pt    Cancer ([place])    Carcinoid tumor of lung 2012   right found incidentally on chest xray eval for r/o vasxculitis    Carpal tunnel syndrome, bilateral    bilat but surgery to correct    Coronary artery calcification 07/23/2016   Double vision    history, no current problem   Dysrhythmia    irregular occassional, no current problem   Frequent UTI    HNP (herniated nucleus pulposus), cervical 01/22/2011   C6-C7  Has seen dr Vertell Limber for this and Dr Tonita Cong  Positional numbness in hands no weakness     Hypertension    Hypothyroidism    Infertility, female    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  Download 08/2014:  Good compliance, breakthru apnea on 10cm.  Changed to auto 5-12cm >> good control of AHI on f/u download ONO on CPAP:       Osteoarthritis    PCOS (polycystic ovarian syndrome)    Sleep apnea    wears cpap    SUI (stress urinary incontinence, female)  h/o    Tobacco History: Social History   Tobacco Use  Smoking Status Never  Smokeless Tobacco Never   Counseling given: Not Answered   Outpatient Medications Prior to Visit  Medication Sig Dispense Refill   aspirin EC 81 MG tablet Take 81 mg by mouth at bedtime.     atorvastatin (LIPITOR) 40 MG tablet Take 1 tablet (40 mg total) by mouth daily. 90 tablet 3   augmented betamethasone dipropionate (DIPROLENE-AF) 0.05 % ointment USE AS DIRECTED. DO NOT USE MORE THAN 5 DAYS IN A ROW OR MORE THAN 7 DAYS EACH MONTH. 45 g 0   CALCIUM PO Take 1 tablet by mouth at bedtime.       chlorthalidone (HYGROTON) 25 MG tablet Take 1 tablet (25 mg total) by mouth daily. 90 tablet 3   denosumab (PROLIA) 60 MG/ML SOSY injection Inject 60 mg into the skin every 6 (six) months.     diltiazem (CARDIZEM CD) 180 MG 24 hr capsule Take 1 capsule (180 mg total) by mouth daily. 90 capsule 3   diltiazem (CARDIZEM) 30 MG tablet 1 tablet every 6 hours for palpitations 60 tablet 3   Dulaglutide (TRULICITY) 4.5 NI/6.2VO SOPN Inject 4.5 mg as directed once a week. 6 mL 0   estradiol (ESTRACE VAGINAL) 0.1 MG/GM vaginal cream INSERT 1 GRAM VAGINALLY TWICE WEEKLY 42.5 g 3   gabapentin (NEURONTIN) 100 MG capsule Take 2 capsules (200 mg total) by mouth 3 (three) times daily. 180 capsule 2   GEMTESA 75 MG TABS Take 1 tablet by mouth daily as needed.     HYDROcodone bit-homatropine (HYCODAN) 5-1.5 MG/5ML syrup Take 5 mLs by mouth every 8 (eight) hours as needed for cough. At night 120 mL 0   metoprolol succinate (TOPROL-XL) 50 MG 24 hr tablet Take 1 tablet (50 mg total) by mouth daily. 90 tablet 3   Multiple Vitamins-Minerals (PRESERVISION AREDS 2 PO) Take 1 tablet by mouth 2 (two) times daily.     naproxen sodium (ALEVE) 220 MG tablet Take 220 mg by mouth 2 (two) times daily as needed (for pain.).     nitrofurantoin, macrocrystal-monohydrate, (MACROBID) 100 MG capsule TAKE 1 CAPSULE DAILY 90 capsule 0   SYNTHROID 137 MCG tablet TAKE 1 TABLET DAILY (NEED TO SCHEDULE AN APPOINTMENT FOR MORE REFILLS) 90 tablet 3   No facility-administered medications prior to visit.     Review of Systems:   Constitutional:   No  weight loss, night sweats,  Fevers, chills, fatigue, or  lassitude.  HEENT:   No headaches,  Difficulty swallowing,  Tooth/dental problems, or  Sore throat,                No sneezing, itching, ear ache, nasal congestion, post nasal drip,   CV:  No chest pain,  Orthopnea, PND, swelling in lower extremities, anasarca, dizziness, palpitations, syncope.   GI  No heartburn, indigestion,  abdominal pain, nausea, vomiting, diarrhea, change in bowel habits, loss of appetite, bloody stools.   Resp: No shortness of breath with exertion or at rest.  No excess mucus, no productive cough,  No non-productive cough,  No coughing up of blood.  No change in color of mucus.  No wheezing.  No chest wall deformity  Skin: no rash or lesions.  GU: no dysuria, change in color of urine, no urgency or frequency.  No flank pain, no hematuria   MS:  No joint pain or swelling.  No decreased range of motion.  No back pain.  Physical Exam  BP 110/80 (BP Location: Left Arm, Patient Position: Sitting, Cuff Size: Large)   Pulse 70   Temp 98.2 F (36.8 C) (Oral)   Ht 4\' 11"  (1.499 m)   Wt 218 lb 3.2 oz (99 kg)   LMP 02/12/2011   SpO2 93%   BMI 44.07 kg/m   GEN: A/Ox3; pleasant , NAD, well nourished    HEENT:  [place]/AT,   NOSE-clear, THROAT-clear, no lesions, no postnasal drip or exudate noted.   NECK:  Supple w/ fair ROM; no JVD; normal carotid impulses w/o bruits; no thyromegaly or nodules palpated; no lymphadenopathy.    RESP  Clear  P & A; w/o, wheezes/ rales/ or rhonchi. no accessory muscle use, no dullness to percussion  CARD:  RRR, no m/r/g, no peripheral edema, pulses intact, no cyanosis or clubbing.  GI:   Soft & nt; nml bowel sounds; no organomegaly or masses detected.   Musco: Warm bil, no deformities or joint swelling noted.   Neuro: alert, no focal deficits noted.    Skin: Warm, no lesions or rashes    Lab Results:  CBC   BMET   BNP No results found for: "BNP"  ProBNP No results found for: "PROBNP"  Imaging:     Assessment & Plan:   Obstructive sleep apnea Excellent control compliance on CPAP.  Continue on current settings We will change to DreamWear nasal mask to see if this is more comfortable  Plan  Patient Instructions  Continue on CPAP At bedtime   Keep up the good work .  Work on healthy weight .  Do not drive if sleepy .  Order for  Dreamwear nasal mask.  Follow up in 1 year with Dr. Halford Chessman or Korine Winton NP and As needed       Class 3 severe obesity with serious comorbidity and body mass index (BMI) of 45.0 to 49.9 in adult [facility]) Healthy weight loss     Rexene Edison, NP 07/18/2022

## 2022-07-18 NOTE — Assessment & Plan Note (Signed)
Healthy weight loss 

## 2022-07-18 NOTE — Patient Instructions (Addendum)
Continue on CPAP At bedtime   Keep up the good work .  Work on healthy weight .  Do not drive if sleepy .  Order for Dreamwear nasal mask.  Follow up in 1 year with Dr. Halford Chessman or Maripat Borba NP and As needed

## 2022-07-18 NOTE — Assessment & Plan Note (Signed)
Excellent control compliance on CPAP.  Continue on current settings We will change to DreamWear nasal mask to see if this is more comfortable  Plan  Patient Instructions  Continue on CPAP At bedtime   Keep up the good work .  Work on healthy weight .  Do not drive if sleepy .  Order for Dreamwear nasal mask.  Follow up in 1 year with Dr. Halford Chessman or Arland Usery NP and As needed

## 2022-07-23 NOTE — Progress Notes (Unsigned)
[place] Phone: [phone] Subjective:   [name], am serving as a scribe for Dr. Hulan Saas.  I'm seeing this patient by the request  of:  Panosh, Standley Brooking, MD  CC: back pain   TLX:[id number]  05/14/2022 Severe spinal stenosis.  Patient has had some intermittent pain on the left side of her back as well that did not cause her to be down for approximately 3 days.  Patient now has responded to the injection at the moment.  Discussed which activities to do and which ones to avoid.  Increase activity slowly otherwise.  Follow-up with me again in 2 to 3 months.  Patient was having some tightness in the right calf and we will monitor.  Worsening pain we will further evaluate the patient will continue with the gabapentin and given some heel lifts to try  Updated 07/24/2022 Nichole Smith is a 65 y.o. female coming in with complaint of back pain known severe spinal stenosis.  Has been 4 months since we have seen patient. Pian in R glute radiates into the R heel. Pain seems to be getting worse. Painful to drive and feels a pulsing sensation when lying in bed. Also feels pain in R achilles and in the ball of the foot. Did purchase new pairs of shoes: New Balance and HOKA. Took Motrin for 7 days which did not help.          Past Medical History:  Diagnosis Date   Arthritis    knees, back   Bursitis    hips - almost gone per pt    Cancer ([place])    Carcinoid tumor of lung 2012   right found incidentally on chest xray eval for r/o vasxculitis    Carpal tunnel syndrome, bilateral    bilat but surgery to correct    Coronary artery calcification 07/23/2016   Double vision    history, no current problem   Dysrhythmia    irregular occassional, no current problem   Frequent UTI    HNP (herniated nucleus pulposus), cervical 01/22/2011   C6-C7  Has seen dr Vertell Limber for this and Dr Tonita Cong  Positional numbness in hands no  weakness     Hypertension    Hypothyroidism    Infertility, female    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  Download 08/2014:  Good compliance, breakthru apnea on 10cm.  Changed to auto 5-12cm >> good control of AHI on f/u download ONO on CPAP:       Osteoarthritis    PCOS (polycystic ovarian syndrome)    Sleep apnea    wears cpap    SUI (stress urinary incontinence, female)    h/o   Past Surgical History:  Procedure Laterality Date   ANTERIOR CERVICAL DECOMP/DISCECTOMY FUSION  12/24/2017   Procedure: Cervical five-six Cervical six-seven Anterior cervical decompression/discectomy/fusion;  Surgeon: Erline Levine, MD;  Location: [facility];  Service: Neurosurgery;;   CARPAL TUNNEL RELEASE Left 12/24/2017   Procedure: LEFT CARPAL TUNNEL RELEASE;  Surgeon: Erline Levine, MD;  Location: [facility];  Service: Neurosurgery;  Laterality: Left;   CESAREAN SECTION     x 1 - twins   COLONOSCOPY     DILATATION & CURRETTAGE/HYSTEROSCOPY WITH RESECTOCOPE N/A 07/18/2013   Procedure: [place];  Surgeon: Lyman Speller, MD;  Location: [facility];  Service: Gynecology;  Laterality: N/A;  mass resection   implantable loop recorder placement  05/20/2021   Medtronic Reveal Linq model LNQ22 implantable loop recorder (SN [id number])   Lung tumor removed     Rt lung carcinoid   MOUTH SURGERY     pre-cancerous ulcer removed   POLYPECTOMY     SHOULDER OPEN ROTATOR CUFF REPAIR  07/20/2010   Right   WISDOM TOOTH EXTRACTION     Social History   Socioeconomic History   Marital status: Married    Spouse name: [name]   Number of children: 2   Years of education: Not on file   Highest education level: Bachelor's degree (e.g., BA, AB, BS)  Occupational History   Occupation: retired    Fish farm manager: [employer]  Tobacco Use   Smoking status: Never   Smokeless  tobacco: Never  Vaping Use   Vaping Use: Never used  Substance and Sexual Activity   Alcohol use: Not Currently   Drug use: No   Sexual activity: Not Currently    Partners: Male    Birth control/protection: Post-menopausal  Other Topics Concern   Not on file  Social History Narrative   hhof 2    2 Children at college and  beyond      [name]    Married    Retired age 72 2015   Recently moved taking care of grandchildren during the week.   Social Determinants of Health   Financial Resource Strain: Low Risk  (06/05/2022)   Overall Financial Resource Strain (CARDIA)    Difficulty of Paying Living Expenses: Not hard at all  Food Insecurity: No Food Insecurity (06/05/2022)   Hunger Vital Sign    Worried About Running Out of Food in the Last Year: Never true    Ran Out of Food in the Last Year: Never true  Transportation Needs: No Transportation Needs (06/05/2022)   PRAPARE - Hydrologist (Medical): No    Lack of Transportation (Non-Medical): No  Physical Activity: Sufficiently Active (06/05/2022)   Exercise Vital Sign    Days of Exercise per Week: 5 days    Minutes of Exercise per Session: 30 min  Stress: No Stress Concern Present (06/05/2022)   [place]    Feeling of Stress : Only a little  Social Connections: Socially Integrated (06/05/2022)   Social Connection and Isolation Panel [NHANES]    Frequency of Communication with Friends and Family: Twice a week    Frequency of Social Gatherings with Friends and Family: Once a week    Attends Religious Services: More than 4 times per year    Active Member of [employer] or Organizations: Yes    Attends Music therapist: More than 4 times per year    Marital Status: Married   Allergies  Allergen Reactions   Neomycin Other (See Comments)    Inflammation--eye drop    Ace Inhibitors Cough   Family History  Problem Relation Age  of Onset   Stroke Mother        died age 54   Hypertension Mother    Sudden death Mother    Alcoholism Mother    Coronary artery disease Father        died age 58   Hypertension Father    Liver disease Father    Alcohol abuse Father    Obesity Father    Parkinson's disease Brother    Diabetes Brother  Hypertension Brother    Hypertension Maternal Uncle    Colon cancer Neg Hx    Rectal cancer Neg Hx    Stomach cancer Neg Hx    Colon polyps Neg Hx    Esophageal cancer Neg Hx     Current Outpatient Medications (Endocrine & Metabolic):    denosumab (PROLIA) 60 MG/ML SOSY injection, Inject 60 mg into the skin every 6 (six) months.   Dulaglutide (TRULICITY) 4.5 CW/2.3JS SOPN, Inject 4.5 mg as directed once a week.   SYNTHROID 137 MCG tablet, TAKE 1 TABLET DAILY (NEED TO SCHEDULE AN APPOINTMENT FOR MORE REFILLS)  Current Outpatient Medications (Cardiovascular):    atorvastatin (LIPITOR) 40 MG tablet, Take 1 tablet (40 mg total) by mouth daily.   chlorthalidone (HYGROTON) 25 MG tablet, Take 1 tablet (25 mg total) by mouth daily.   diltiazem (CARDIZEM CD) 180 MG 24 hr capsule, Take 1 capsule (180 mg total) by mouth daily.   diltiazem (CARDIZEM) 30 MG tablet, 1 tablet every 6 hours for palpitations   metoprolol succinate (TOPROL-XL) 50 MG 24 hr tablet, Take 1 tablet (50 mg total) by mouth daily.  Current Outpatient Medications (Respiratory):    HYDROcodone bit-homatropine (HYCODAN) 5-1.5 MG/5ML syrup, Take 5 mLs by mouth every 8 (eight) hours as needed for cough. At night  Current Outpatient Medications (Analgesics):    aspirin EC 81 MG tablet, Take 81 mg by mouth at bedtime.   naproxen sodium (ALEVE) 220 MG tablet, Take 220 mg by mouth 2 (two) times daily as needed (for pain.).   Current Outpatient Medications (Other):    augmented betamethasone dipropionate (DIPROLENE-AF) 0.05 % ointment, USE AS DIRECTED. DO NOT USE MORE THAN 5 DAYS IN A ROW OR MORE THAN 7 DAYS EACH MONTH.    CALCIUM PO, Take 1 tablet by mouth at bedtime.    estradiol (ESTRACE VAGINAL) 0.1 MG/GM vaginal cream, INSERT 1 GRAM VAGINALLY TWICE WEEKLY   gabapentin (NEURONTIN) 300 MG capsule, Take 1 capsule (300 mg total) by mouth at bedtime.   GEMTESA 75 MG TABS, Take 1 tablet by mouth daily as needed.   Multiple Vitamins-Minerals (PRESERVISION AREDS 2 PO), Take 1 tablet by mouth 2 (two) times daily.   nitrofurantoin, macrocrystal-monohydrate, (MACROBID) 100 MG capsule, TAKE 1 CAPSULE DAILY   Reviewed prior external information including notes and imaging from  primary care provider As well as notes that were available from care everywhere and other healthcare systems.  Past medical history, social, surgical and family history all reviewed in electronic medical record.  No pertanent information unless stated regarding to the chief complaint.   Review of Systems:  No headache, visual changes, nausea, vomiting, diarrhea, constipation, dizziness, abdominal pain, skin rash, fevers, chills, night sweats, weight loss, swollen lymph nodes, body aches, joint swelling, chest pain, shortness of breath, mood changes. POSITIVE muscle aches  Objective  Blood pressure 128/88, pulse 67, height 4\' 11"  (1.499 m), weight 218 lb (98.9 kg), last menstrual period 02/12/2011, SpO2 98 %.   General: No apparent distress alert and oriented x3 mood and affect normal, dressed appropriately.  HEENT: Pupils equal, extraocular movements intact  Respiratory: Patient's speak in full sentences and does not appear short of breath  Cardiovascular: No lower extremity edema, non tender, no erythema  Low back exam shows significant loss of lordosis.  Patient has still some poor core strength.  Patient has some mild difficulty getting up from a seated to standing position.    Impression and Recommendations:    The above  documentation has been reviewed and is accurate and complete Lyndal Pulley, DO

## 2022-07-24 ENCOUNTER — Ambulatory Visit (INDEPENDENT_AMBULATORY_CARE_PROVIDER_SITE_OTHER): Payer: Medicare Other | Admitting: Family Medicine

## 2022-07-24 VITALS — BP 128/88 | HR 67 | Ht 59.0 in | Wt 218.0 lb

## 2022-07-24 DIAGNOSIS — I2584 Coronary atherosclerosis due to calcified coronary lesion: Secondary | ICD-10-CM | POA: Diagnosis not present

## 2022-07-24 DIAGNOSIS — I2510 Atherosclerotic heart disease of native coronary artery without angina pectoris: Secondary | ICD-10-CM | POA: Diagnosis not present

## 2022-07-24 DIAGNOSIS — M5416 Radiculopathy, lumbar region: Secondary | ICD-10-CM

## 2022-07-24 MED ORDER — GABAPENTIN 300 MG PO CAPS: 300.0000 mg | ORAL_CAPSULE | Freq: Every day | ORAL | 0 refills | Status: DC

## 2022-07-24 NOTE — Patient Instructions (Signed)
Gabapentin 300mg  at night [facility]   [phone] See me in 3 months

## 2022-07-25 ENCOUNTER — Ambulatory Visit (INDEPENDENT_AMBULATORY_CARE_PROVIDER_SITE_OTHER): Payer: Medicare Other | Admitting: Cardiology

## 2022-07-25 ENCOUNTER — Encounter: Payer: Self-pay | Admitting: Cardiology

## 2022-07-25 VITALS — BP 118/78 | HR 64 | Ht 59.0 in | Wt 216.0 lb

## 2022-07-25 DIAGNOSIS — I2510 Atherosclerotic heart disease of native coronary artery without angina pectoris: Secondary | ICD-10-CM

## 2022-07-25 DIAGNOSIS — D239 Other benign neoplasm of skin, unspecified: Secondary | ICD-10-CM | POA: Insufficient documentation

## 2022-07-25 DIAGNOSIS — I2584 Coronary atherosclerosis due to calcified coronary lesion: Secondary | ICD-10-CM

## 2022-07-25 DIAGNOSIS — I480 Paroxysmal atrial fibrillation: Secondary | ICD-10-CM | POA: Diagnosis not present

## 2022-07-25 DIAGNOSIS — M67441 Ganglion, right hand: Secondary | ICD-10-CM | POA: Insufficient documentation

## 2022-07-25 DIAGNOSIS — L578 Other skin changes due to chronic exposure to nonionizing radiation: Secondary | ICD-10-CM | POA: Insufficient documentation

## 2022-07-25 DIAGNOSIS — B079 Viral wart, unspecified: Secondary | ICD-10-CM | POA: Insufficient documentation

## 2022-07-25 MED ORDER — DILTIAZEM HCL 30 MG PO TABS: ORAL_TABLET | ORAL | 3 refills | Status: DC

## 2022-07-25 NOTE — Progress Notes (Signed)
Electrophysiology Office Note   Date:  07/25/2022   ID:  Nichole Smith, Nichole Smith 21-Jan-1956, MRN [id number]  PCP:  Burnis Medin, MD  Cardiologist:  Radford Pax Primary Electrophysiologist:  Leslieanne Cobarrubias Meredith Leeds, MD    Chief Complaint: pacemaker   History of Present Illness: Nichole Smith is a 66 y.o. female who is being seen today for the evaluation of pacemaker at the request of Panosh, Standley Brooking, MD. Presenting today for electrophysiology evaluation.  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  She was found to have atrial fibrillation in 2022.  She was treated with diltiazem and metoprolol.  She is not anticoagulated due to a low burden.  She had an ILR implanted.  Today, she denies symptoms of palpitations, chest pain, shortness of breath, orthopnea, PND, lower extremity edema, claudication, dizziness, presyncope, syncope, bleeding, or neurologic sequela. The patient is tolerating medications without difficulties.    Past Medical History:  Diagnosis Date   Arthritis    knees, back   Bursitis    hips - almost gone per pt    Cancer ([place])    Carcinoid tumor of lung 2012   right found incidentally on chest xray eval for r/o vasxculitis    Carpal tunnel syndrome, bilateral    bilat but surgery to correct    Coronary artery calcification 07/23/2016   Double vision    history, no current problem   Dysrhythmia    irregular occassional, no current problem   Frequent UTI    HNP (herniated nucleus pulposus), cervical 01/22/2011   C6-C7  Has seen dr Vertell Limber for this and Dr Tonita Cong  Positional numbness in hands no weakness     Hypertension    Hypothyroidism    Infertility, female    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  Download 08/2014:  Good  compliance, breakthru apnea on 10cm.  Changed to auto 5-12cm >> good control of AHI on f/u download ONO on CPAP:       Osteoarthritis    PCOS (polycystic ovarian syndrome)    Sleep apnea    wears cpap    SUI (stress urinary incontinence, female)    h/o   Past Surgical History:  Procedure Laterality Date   ANTERIOR CERVICAL DECOMP/DISCECTOMY FUSION  12/24/2017   Procedure: Cervical five-six Cervical six-seven Anterior cervical decompression/discectomy/fusion;  Surgeon: Erline Levine, MD;  Location: [place];  Service: Neurosurgery;;   CARPAL TUNNEL RELEASE Left 12/24/2017   Procedure: LEFT CARPAL TUNNEL RELEASE;  Surgeon: Erline Levine, MD;  Location: [place];  Service: Neurosurgery;  Laterality: Left;   CESAREAN SECTION     x 1 - twins   COLONOSCOPY     DILATATION & CURRETTAGE/HYSTEROSCOPY WITH RESECTOCOPE N/A 07/18/2013   Procedure: [place];  Surgeon: Lyman Speller, MD;  Location: [facility];  Service: Gynecology;  Laterality: N/A;  mass resection   implantable loop recorder placement  05/20/2021   Medtronic Reveal Linq model [id number] implantable loop recorder (SN [id number])   Lung tumor removed     Rt lung carcinoid   MOUTH SURGERY     pre-cancerous ulcer removed   POLYPECTOMY     SHOULDER OPEN ROTATOR CUFF REPAIR  07/20/2010   Right   WISDOM TOOTH EXTRACTION       Current Outpatient  Medications  Medication Sig Dispense Refill   aspirin EC 81 MG tablet Take 81 mg by mouth at bedtime.     atorvastatin (LIPITOR) 40 MG tablet Take 1 tablet (40 mg total) by mouth daily. 90 tablet 3   augmented betamethasone dipropionate (DIPROLENE-AF) 0.05 % ointment USE AS DIRECTED. DO NOT USE MORE THAN 5 DAYS IN A ROW OR MORE THAN 7 DAYS EACH MONTH. 45 g 0   CALCIUM PO Take 1 tablet by mouth at bedtime.      chlorthalidone (HYGROTON) 25 MG tablet Take 1 tablet (25 mg total) by mouth daily. 90 tablet 3   denosumab (PROLIA) 60 MG/ML SOSY injection Inject 60  mg into the skin every 6 (six) months.     diltiazem (CARDIZEM CD) 180 MG 24 hr capsule Take 1 capsule (180 mg total) by mouth daily. 90 capsule 3   Dulaglutide (TRULICITY) 4.5 LZ/7.6BH SOPN Inject 4.5 mg as directed once a week. 6 mL 0   esomeprazole (NEXIUM) 40 MG capsule Take 40 mg by mouth daily at 12 noon.     estradiol (ESTRACE VAGINAL) 0.1 MG/GM vaginal cream INSERT 1 GRAM VAGINALLY TWICE WEEKLY 42.5 g 3   gabapentin (NEURONTIN) 300 MG capsule Take 1 capsule (300 mg total) by mouth at bedtime. 90 capsule 0   GEMTESA 75 MG TABS Take 1 tablet by mouth daily as needed.     metoprolol succinate (TOPROL-XL) 50 MG 24 hr tablet Take 1 tablet (50 mg total) by mouth daily. 90 tablet 3   Multiple Vitamins-Minerals (PRESERVISION AREDS 2 PO) Take 1 tablet by mouth 2 (two) times daily.     naproxen sodium (ALEVE) 220 MG tablet Take 220 mg by mouth 2 (two) times daily as needed (for pain.).     nitrofurantoin, macrocrystal-monohydrate, (MACROBID) 100 MG capsule Take 100 mg by mouth 2 (two) times daily.     SYNTHROID 137 MCG tablet TAKE 1 TABLET DAILY (NEED TO SCHEDULE AN APPOINTMENT FOR MORE REFILLS) 90 tablet 3   diltiazem (CARDIZEM) 30 MG tablet 1 tablet every 6 hours for palpitations 60 tablet 3   HYDROcodone bit-homatropine (HYCODAN) 5-1.5 MG/5ML syrup Take 5 mLs by mouth every 8 (eight) hours as needed for cough. At night (Patient not taking: Reported on 07/25/2022) 120 mL 0   No current facility-administered medications for this visit.    Allergies:   Neomycin and Ace inhibitors   Social History:  The patient  reports that she has never smoked. She has never used smokeless tobacco. She reports that she does not currently use alcohol. She reports that she does not use drugs.   Family History:  The patient's family history includes Alcohol abuse in her father; Alcoholism in her mother; Coronary artery disease in her father; Diabetes in her brother; Hypertension in her brother, father, maternal  uncle, and mother; Liver disease in her father; Obesity in her father; Parkinson's disease in her brother; Stroke in her mother; Sudden death in her mother.    ROS:  Please see the history of present illness.   Otherwise, review of systems is positive for none.   All other systems are reviewed and negative.    PHYSICAL EXAM: VS:  BP 118/78   Pulse 64   Ht 4\' 11"  (1.499 m)   Wt 216 lb (98 kg)   LMP 02/12/2011   SpO2 93%   BMI 43.63 kg/m  , BMI Body mass index is 43.63 kg/m. GEN: Well nourished, well developed, in no acute distress  HEENT: normal  Neck: no JVD, carotid bruits, or masses Cardiac: RRR; no murmurs, rubs, or gallops,no edema  Respiratory:  clear to auscultation bilaterally, normal work of breathing GI: soft, nontender, nondistended, + BS MS: no deformity or atrophy  Skin: warm and dry, device pocket is well healed Neuro:  Strength and sensation are intact Psych: euthymic mood, full affect  EKG:  EKG is ordered today. Personal review of the ekg ordered shows sinus rhythm  Device interrogation is reviewed today in detail.  See PaceArt for details.   Recent Labs: 07/30/2021: Hemoglobin 15.3; Platelets 239.0 10/03/2021: TSH 3.070 05/13/2022: ALT 34; BUN 21; Creatinine, Ser 0.62; Potassium 3.7; Sodium 142    Lipid Panel     Component Value Date/Time   CHOL 148 10/03/2021 0806   CHOL 110 11/03/2016 0000   TRIG 71 10/03/2021 0806   TRIG 54 11/03/2016 0000   HDL 58 10/03/2021 0806   HDL 48 (L) 11/03/2016 0000   CHOLHDL 2 07/30/2021 1005   VLDL 9.8 07/30/2021 1005   LDLCALC 76 10/03/2021 0806   LDLCALC 49 11/03/2016 0000     Wt Readings from Last 3 Encounters:  07/25/22 216 lb (98 kg)  07/24/22 218 lb (98.9 kg)  07/18/22 218 lb 3.2 oz (99 kg)      Other studies Reviewed: Additional studies/ records that were reviewed today include: TTE 2020  Review of the above records today demonstrates:  - Left ventricle: The cavity size was normal. Wall thickness was     normal. Systolic function was normal. The estimated ejection    fraction was in the range of 60% to 65%. Wall motion was normal;    there were no regional wall motion abnormalities. Doppler    parameters are consistent with abnormal left ventricular    relaxation (grade 1 diastolic dysfunction).    ASSESSMENT AND PLAN:  1.  Paroxysmal atrial fibrillation: CHA2DS2-VASc of 3-4.  Currently not anticoagulated due to low burden.  Her last episode of atrial fibrillation was summer 2022.  She is overall quite happy with her control.  We Emilyann Banka continue with current management.  2.  Hypertension: Currently well controlled  3.  Obstructive sleep apnea: CPAP compliance encouraged  4.  Morbid obesity: Diet and exercise encouraged. Body mass index is 43.63 kg/m.  5.  Elevated coronary calcium score: Continue high intensity statin per primary cardiology   Current medicines are reviewed at length with the patient today.   The patient does not have concerns regarding her medicines.  The following changes were made today:  none  Labs/ tests ordered today include:  Orders Placed This Encounter  Procedures   EKG 12-Lead     Disposition:   FU 1 year  Signed, Aloria Looper Meredith Leeds, MD  07/25/2022 10:57 AM     [facility] [address] [place] [address] [phone] (office) [phone] (fax)

## 2022-07-27 NOTE — Progress Notes (Signed)
Reviewed and agree with assessment/plan.   Chesley Mires, MD [facility] Pulmonary/Critical Care 07/27/2022, 4:15 PM Pager:  [phone]

## 2022-07-28 ENCOUNTER — Encounter: Payer: Medicare Other | Admitting: Internal Medicine

## 2022-07-30 ENCOUNTER — Ambulatory Visit (INDEPENDENT_AMBULATORY_CARE_PROVIDER_SITE_OTHER): Payer: Medicare Other | Admitting: Nurse Practitioner

## 2022-07-30 ENCOUNTER — Encounter (INDEPENDENT_AMBULATORY_CARE_PROVIDER_SITE_OTHER): Payer: Self-pay | Admitting: Nurse Practitioner

## 2022-07-30 VITALS — BP 115/74 | HR 69 | Temp 99.1°F | Ht 59.0 in | Wt 212.0 lb

## 2022-07-30 DIAGNOSIS — E669 Obesity, unspecified: Secondary | ICD-10-CM | POA: Diagnosis not present

## 2022-07-30 DIAGNOSIS — Z6835 Body mass index (BMI) 35.0-35.9, adult: Secondary | ICD-10-CM | POA: Diagnosis not present

## 2022-07-30 DIAGNOSIS — R7303 Prediabetes: Secondary | ICD-10-CM | POA: Diagnosis not present

## 2022-07-30 MED ORDER — TRULICITY 4.5 MG/0.5ML ~~LOC~~ SOAJ: 4.5000 mg | SUBCUTANEOUS | 0 refills | Status: DC

## 2022-08-04 NOTE — Progress Notes (Signed)
Chief Complaint:   OBESITY Nichole Smith is here to discuss her progress with her obesity treatment plan along with follow-up of her obesity related diagnoses. Nichole Smith is on keeping a food journal and adhering to recommended goals of 1500 calories and 90 protein and states she is following her eating plan approximately 50% of the time. Nichole Smith states she is doing water aerobics and strength training 15 minutes 7 times per week.  Today's visit was #: 58 Starting weight: 238 lbs Starting date: 01/12/2019 Today's weight: 212 lbs Today's date: 07/30/2022 Total lbs lost to date: 26 lbs Total lbs lost since last in-office visit: +2 lbs  Interim History: Getting frustrated with weight gain.  Reports weighing food.  Calories:  1400-1500, 50% of the time.  Protein:  75-125 grams, carbs-unsure.  Drinking coffee and water.  Going on 2 trips prior to her next visit.   Subjective:   1. Prediabetes Taking Trulicity 4.5 mg.  Denies side effects.  Denies hypoglycemia.  Struggling with hunger and cravings, but feels Trulicity has helped some.   Assessment/Plan:   1. Prediabetes Nichole Smith will continue to work on weight loss, exercise, and decreasing simple carbohydrates to help decrease the risk of diabetes.    Refill - Dulaglutide (TRULICITY) 4.5 QT/6.2UQ SOPN; Inject 4.5 mg as directed once a week.  Dispense: 6 mL; Refill: 0 Side effects discussed.   2. Obesity BMI today is 35.4 Handouts:  100-200 calorie snack sheet, smart fruit choices, dining out guide.  Will obtain IC at next office visit.   Nichole Smith is currently in the action stage of change. As such, her goal is to continue with weight loss efforts. She has agreed to keeping a food journal and adhering to recommended goals of 1500 calories and 90 protein.   Exercise goals:  pool and bike.  Behavioral modification strategies: increasing lean protein intake, increasing vegetables, increasing water intake, and planning for success.  Nichole Smith has agreed to  follow-up with our clinic in 4 weeks. She was informed of the importance of frequent follow-up visits to maximize her success with intensive lifestyle modifications for her multiple health conditions.   Objective:   Blood pressure 115/74, pulse 69, temperature 99.1 F (37.3 C), height 4\' 11"  (1.499 m), weight 212 lb (96.2 kg), last menstrual period 02/12/2011, SpO2 96 %. Body mass index is 42.82 kg/m.  General: Cooperative, alert, well developed, in no acute distress. HEENT: Conjunctivae and lids unremarkable. Cardiovascular: Regular rhythm.  Lungs: Normal work of breathing. Neurologic: No focal deficits.   Lab Results  Component Value Date   CREATININE 0.62 05/13/2022   BUN 21 05/13/2022   NA 142 05/13/2022   K 3.7 05/13/2022   CL 99 05/13/2022   CO2 24 05/13/2022   Lab Results  Component Value Date   ALT 34 (H) 05/13/2022   AST 26 05/13/2022   ALKPHOS 67 05/13/2022   BILITOT 0.6 05/13/2022   Lab Results  Component Value Date   HGBA1C 5.5 05/13/2022   HGBA1C 5.5 10/03/2021   HGBA1C 5.3 06/25/2021   HGBA1C 5.4 06/27/2020   HGBA1C 5.5 01/02/2020   Lab Results  Component Value Date   INSULIN 12.2 05/13/2022   INSULIN 13.6 10/03/2021   INSULIN 12.2 06/25/2021   INSULIN 13.7 06/27/2020   INSULIN 13.5 01/02/2020   Lab Results  Component Value Date   TSH 3.070 10/03/2021   Lab Results  Component Value Date   CHOL 148 10/03/2021   HDL 58 10/03/2021   LDLCALC 76  10/03/2021   TRIG 71 10/03/2021   CHOLHDL 2 07/30/2021   Lab Results  Component Value Date   VD25OH 42.3 05/13/2022   VD25OH 62.5 10/03/2021   VD25OH 53.1 06/25/2021   Lab Results  Component Value Date   WBC 8.7 07/30/2021   HGB 15.3 (H) 07/30/2021   HCT 45.2 07/30/2021   MCV 93.9 07/30/2021   PLT 239.0 07/30/2021   No results found for: "IRON", "TIBC", "FERRITIN"  Obesity Behavioral Intervention:   Approximately 15 minutes were spent on the discussion below.  ASK: We discussed the  diagnosis of obesity with Nichole Smith today and Nichole Smith agreed to give [name] permission to discuss obesity behavioral modification therapy today.  ASSESS: Nichole Smith has the diagnosis of obesity and her BMI today is 35.4. Nichole Smith is in the action stage of change.   ADVISE: Nichole Smith was educated on the multiple health risks of obesity as well as the benefit of weight loss to improve her health. She was advised of the need for long term treatment and the importance of lifestyle modifications to improve her current health and to decrease her risk of future health problems.  AGREE: Multiple dietary modification options and treatment options were discussed and Nichole Smith agreed to follow the recommendations documented in the above note.  ARRANGE: Nichole Smith was educated on the importance of frequent visits to treat obesity as outlined per CMS and USPSTF guidelines and agreed to schedule her next follow up appointment today.  Attestation Statements:   Reviewed by clinician on day of visit: allergies, medications, problem list, medical history, surgical history, family history, social history, and previous encounter notes.  I, Davy Pique, RMA, am acting as transcriptionist for Everardo Pacific, FNP  I have reviewed the above documentation for accuracy and completeness, and I agree with the above. Everardo Pacific, FNP

## 2022-08-07 ENCOUNTER — Ambulatory Visit (INDEPENDENT_AMBULATORY_CARE_PROVIDER_SITE_OTHER): Payer: Medicare Other | Admitting: Obstetrics & Gynecology

## 2022-08-07 ENCOUNTER — Encounter (HOSPITAL_BASED_OUTPATIENT_CLINIC_OR_DEPARTMENT_OTHER): Payer: Self-pay | Admitting: Obstetrics & Gynecology

## 2022-08-07 VITALS — BP 128/55 | HR 61 | Ht 59.0 in | Wt 215.4 lb

## 2022-08-07 DIAGNOSIS — M8580 Other specified disorders of bone density and structure, unspecified site: Secondary | ICD-10-CM

## 2022-08-07 DIAGNOSIS — M810 Age-related osteoporosis without current pathological fracture: Secondary | ICD-10-CM | POA: Diagnosis not present

## 2022-08-07 DIAGNOSIS — N390 Urinary tract infection, site not specified: Secondary | ICD-10-CM

## 2022-08-07 DIAGNOSIS — Z9189 Other specified personal risk factors, not elsewhere classified: Secondary | ICD-10-CM | POA: Diagnosis not present

## 2022-08-07 DIAGNOSIS — B009 Herpesviral infection, unspecified: Secondary | ICD-10-CM

## 2022-08-07 MED ORDER — NITROFURANTOIN MONOHYD MACRO 100 MG PO CAPS: 100.0000 mg | ORAL_CAPSULE | Freq: Every day | ORAL | 3 refills | Status: DC

## 2022-08-07 MED ORDER — ESTRADIOL 0.1 MG/GM VA CREA: TOPICAL_CREAM | VAGINAL | 3 refills | Status: DC

## 2022-08-07 MED ORDER — SULFAMETHOXAZOLE-TRIMETHOPRIM 800-160 MG PO TABS: 1.0000 | ORAL_TABLET | Freq: Two times a day (BID) | ORAL | 1 refills | Status: DC

## 2022-08-07 NOTE — Progress Notes (Unsigned)
66 y.o. G2P2 Married White or Caucasian female here for breast and pelvic exam.  I am also following her for recurrent UTIs.  Had one infection this year.  Use bactrim.  Does need refills for suppressive antibiotic and vaginal estrogen cream.  Denies vaginal bleeding.  Patient's last menstrual period was 02/12/2011.          Sexually active: Yes.    H/O STD:  no  Health Maintenance: PCP:  Dr. Regis Bill.  Last wellness appt was june.  Did blood work at that appt: yes Vaccines are up to date:  yes Colonoscopy:  09/25/2020.  Follow up 3 years. MMG:  08/22/2021 Negative BMD:  08/22/2021 Osteoporosis Last pap smear:  07/26/2020 Negative.   H/o abnormal pap smear:  no    reports that she has never smoked. She has never used smokeless tobacco. She reports that she does not currently use alcohol. She reports that she does not use drugs.  Past Medical History:  Diagnosis Date   Arthritis    knees, back   Bursitis    hips - almost gone per pt    Cancer ([place])    Carcinoid tumor of lung 2012   right found incidentally on chest xray eval for r/o vasxculitis    Carpal tunnel syndrome, bilateral    bilat but surgery to correct    Coronary artery calcification 07/23/2016   Double vision    history, no current problem   Dysrhythmia    irregular occassional, no current problem   Frequent UTI    HNP (herniated nucleus pulposus), cervical 01/22/2011   C6-C7  Has seen dr Vertell Limber for this and Dr Tonita Cong  Positional numbness in hands no weakness     Hypertension    Hypothyroidism    Infertility, female    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  Download 08/2014:  Good compliance, breakthru apnea on 10cm.  Changed to auto 5-12cm >> good control of AHI on f/u download ONO on CPAP:       Osteoarthritis    PCOS (polycystic ovarian syndrome)    Sleep apnea    wears cpap    SUI (stress urinary incontinence, female)     h/o    Past Surgical History:  Procedure Laterality Date   ANTERIOR CERVICAL DECOMP/DISCECTOMY FUSION  12/24/2017   Procedure: Cervical five-six Cervical six-seven Anterior cervical decompression/discectomy/fusion;  Surgeon: Erline Levine, MD;  Location: [place];  Service: Neurosurgery;;   CARPAL TUNNEL RELEASE Left 12/24/2017   Procedure: LEFT CARPAL TUNNEL RELEASE;  Surgeon: Erline Levine, MD;  Location: [place];  Service: Neurosurgery;  Laterality: Left;   CESAREAN SECTION     x 1 - twins   COLONOSCOPY     DILATATION & CURRETTAGE/HYSTEROSCOPY WITH RESECTOCOPE N/A 07/18/2013   Procedure: [place];  Surgeon: Lyman Speller, MD;  Location: [facility];  Service: Gynecology;  Laterality: N/A;  mass resection   implantable loop recorder placement  05/20/2021   Medtronic Reveal Linq model [id number] implantable loop recorder (SN [id number])   Lung tumor removed     Rt lung carcinoid   MOUTH SURGERY     pre-cancerous ulcer removed   POLYPECTOMY     SHOULDER OPEN ROTATOR CUFF REPAIR  07/20/2010   Right   WISDOM TOOTH EXTRACTION      Current Outpatient Medications  Medication Sig Dispense Refill   aspirin EC 81 MG tablet Take  81 mg by mouth at bedtime.     atorvastatin (LIPITOR) 40 MG tablet Take 1 tablet (40 mg total) by mouth daily. 90 tablet 3   CALCIUM PO Take 1 tablet by mouth at bedtime.      chlorthalidone (HYGROTON) 25 MG tablet Take 1 tablet (25 mg total) by mouth daily. 90 tablet 3   denosumab (PROLIA) 60 MG/ML SOSY injection Inject 60 mg into the skin every 6 (six) months.     diltiazem (CARDIZEM CD) 180 MG 24 hr capsule Take 1 capsule (180 mg total) by mouth daily. 90 capsule 3   diltiazem (CARDIZEM) 30 MG tablet 1 tablet every 6 hours for palpitations 60 tablet 3   Dulaglutide (TRULICITY) 4.5 TK/3.5WS SOPN Inject 4.5 mg as directed once a week. 6 mL 0   esomeprazole (NEXIUM) 40 MG capsule Take 40 mg by mouth daily at 12 noon.      gabapentin (NEURONTIN) 300 MG capsule Take 1 capsule (300 mg total) by mouth at bedtime. 90 capsule 0   GEMTESA 75 MG TABS Take 1 tablet by mouth daily as needed.     HYDROcodone bit-homatropine (HYCODAN) 5-1.5 MG/5ML syrup Take 5 mLs by mouth every 8 (eight) hours as needed for cough. At night 120 mL 0   metoprolol succinate (TOPROL-XL) 50 MG 24 hr tablet Take 1 tablet (50 mg total) by mouth daily. 90 tablet 3   Multiple Vitamins-Minerals (PRESERVISION AREDS 2 PO) Take 1 tablet by mouth 2 (two) times daily.     naproxen sodium (ALEVE) 220 MG tablet Take 220 mg by mouth 2 (two) times daily as needed (for pain.).     nitrofurantoin, macrocrystal-monohydrate, (MACROBID) 100 MG capsule Take 1 capsule (100 mg total) by mouth daily. 90 capsule 3   sulfamethoxazole-trimethoprim (BACTRIM DS) 800-160 MG tablet Take 1 tablet by mouth 2 (two) times daily. 10 tablet 1   SYNTHROID 137 MCG tablet TAKE 1 TABLET DAILY (NEED TO SCHEDULE AN APPOINTMENT FOR MORE REFILLS) 90 tablet 3   estradiol (ESTRACE VAGINAL) 0.1 MG/GM vaginal cream INSERT 1 GRAM VAGINALLY TWICE WEEKLY 42.5 g 3   No current facility-administered medications for this visit.    Family History  Problem Relation Age of Onset   Stroke Mother        died age 70   Hypertension Mother    Sudden death Mother    Alcoholism Mother    Coronary artery disease Father        died age 37   Hypertension Father    Liver disease Father    Alcohol abuse Father    Obesity Father    Parkinson's disease Brother    Diabetes Brother    Hypertension Brother    Hypertension Maternal Uncle    Colon cancer Neg Hx    Rectal cancer Neg Hx    Stomach cancer Neg Hx    Colon polyps Neg Hx    Esophageal cancer Neg Hx     Review of Systems  Constitutional: Negative.   Genitourinary: Negative.     Exam:   BP (!) 128/55 (BP Location: Right Arm, Patient Position: Sitting, Cuff Size: Large)   Pulse 61   Ht 4\' 11"  (1.499 m) Comment: Reported  Wt 215 lb 6.4  oz (97.7 kg)   LMP 02/12/2011   BMI 43.51 kg/m   Height: 4\' 11"  (149.9 cm) (Reported)  General appearance: alert, cooperative and appears stated age Breasts: normal appearance, no masses or tenderness Abdomen: soft, non-tender; bowel sounds normal;  no masses,  no organomegaly Lymph nodes: Cervical, supraclavicular, and axillary nodes normal.  No abnormal inguinal nodes palpated Neurologic: Grossly normal  Pelvic: External genitalia:  no lesions              Urethra:  normal appearing urethra with no masses, tenderness or lesions              Bartholins and Skenes: normal                 Vagina: normal appearing vagina with atrophic changes and no discharge, no lesions              Cervix: no lesions              Pap taken: No. Bimanual Exam:  Uterus:  normal size, contour, position, consistency, mobility, non-tender              Adnexa: no mass, fullness, tenderness               Rectovaginal: Confirms               Anus:  normal sphincter tone, no lesions  Chaperone, Octaviano Batty, CMA, was present for exam.  Assessment/Plan: 1. GYN exam for high-risk Medicare patient ***  2. Recurrent UTI *** - estradiol (ESTRACE VAGINAL) 0.1 MG/GM vaginal cream; INSERT 1 GRAM VAGINALLY TWICE WEEKLY  Dispense: 42.5 g; Refill: 3 - nitrofurantoin, macrocrystal-monohydrate, (MACROBID) 100 MG capsule; Take 1 capsule (100 mg total) by mouth daily.  Dispense: 90 capsule; Refill: 3  3. Osteopenia, unspecified location ***  4. HSV infection ***

## 2022-08-10 DIAGNOSIS — M85852 Other specified disorders of bone density and structure, left thigh: Secondary | ICD-10-CM | POA: Insufficient documentation

## 2022-08-10 DIAGNOSIS — B009 Herpesviral infection, unspecified: Secondary | ICD-10-CM | POA: Insufficient documentation

## 2022-08-10 DIAGNOSIS — M810 Age-related osteoporosis without current pathological fracture: Secondary | ICD-10-CM | POA: Insufficient documentation

## 2022-08-11 ENCOUNTER — Ambulatory Visit (INDEPENDENT_AMBULATORY_CARE_PROVIDER_SITE_OTHER): Payer: Medicare Other

## 2022-08-11 ENCOUNTER — Ambulatory Visit
Admission: RE | Admit: 2022-08-11 | Discharge: 2022-08-11 | Disposition: A | Discharge status: Home / Self Care | Payer: Medicare Other | Source: Ambulatory Visit | Attending: Family Medicine | Admitting: Family Medicine

## 2022-08-11 DIAGNOSIS — I480 Paroxysmal atrial fibrillation: Secondary | ICD-10-CM | POA: Diagnosis not present

## 2022-08-11 DIAGNOSIS — M5416 Radiculopathy, lumbar region: Secondary | ICD-10-CM

## 2022-08-11 MED ORDER — METHYLPREDNISOLONE ACETATE 40 MG/ML INJ SUSP (RADIOLOG
80.0000 mg | Freq: Once | INTRAMUSCULAR | Status: AC
  Administered 2022-08-11: 80 mg via EPIDURAL

## 2022-08-11 MED ORDER — IOPAMIDOL (ISOVUE-M 200) INJECTION 41%
1.0000 mL | Freq: Once | INTRAMUSCULAR | Status: AC
  Administered 2022-08-11: 1 mL via EPIDURAL

## 2022-08-11 NOTE — Discharge Instructions (Signed)

## 2022-08-12 ENCOUNTER — Ambulatory Visit: Payer: Medicare Other | Admitting: Family Medicine

## 2022-08-12 NOTE — Progress Notes (Signed)
[facility] Summary Report / Loop Recorder 

## 2022-08-16 LAB — CUP PACEART REMOTE DEVICE CHECK
Date Time Interrogation Session: 20230907230558
Implantable Pulse Generator Implant Date: 20220620

## 2022-08-17 ENCOUNTER — Encounter: Payer: Self-pay | Admitting: Family Medicine

## 2022-08-18 ENCOUNTER — Other Ambulatory Visit (INDEPENDENT_AMBULATORY_CARE_PROVIDER_SITE_OTHER): Payer: Medicare Other

## 2022-08-18 ENCOUNTER — Ambulatory Visit (INDEPENDENT_AMBULATORY_CARE_PROVIDER_SITE_OTHER): Payer: Medicare Other

## 2022-08-18 DIAGNOSIS — Z79899 Other long term (current) drug therapy: Secondary | ICD-10-CM

## 2022-08-18 DIAGNOSIS — E038 Other specified hypothyroidism: Secondary | ICD-10-CM

## 2022-08-18 DIAGNOSIS — R053 Chronic cough: Secondary | ICD-10-CM | POA: Diagnosis not present

## 2022-08-18 DIAGNOSIS — Z6841 Body Mass Index (BMI) 40.0 and over, adult: Secondary | ICD-10-CM

## 2022-08-18 DIAGNOSIS — Z85110 Personal history of malignant carcinoid tumor of bronchus and lung: Secondary | ICD-10-CM

## 2022-08-18 DIAGNOSIS — R7303 Prediabetes: Secondary | ICD-10-CM

## 2022-08-18 DIAGNOSIS — E7849 Other hyperlipidemia: Secondary | ICD-10-CM

## 2022-08-18 LAB — HEMOGLOBIN A1C: Hgb A1c MFr Bld: 5.7 % (ref 4.6–6.5)

## 2022-08-18 LAB — CBC WITH DIFFERENTIAL/PLATELET
Basophils Absolute: 0 10*3/uL (ref 0.0–0.1)
Basophils Relative: 0.2 % (ref 0.0–3.0)
Eosinophils Absolute: 0.2 10*3/uL (ref 0.0–0.7)
Eosinophils Relative: 1.5 % (ref 0.0–5.0)
HCT: 45.6 % (ref 36.0–46.0)
Hemoglobin: 15.5 g/dL — ABNORMAL HIGH (ref 12.0–15.0)
Lymphocytes Relative: 25.1 % (ref 12.0–46.0)
Lymphs Abs: 3.3 10*3/uL (ref 0.7–4.0)
MCHC: 34 g/dL (ref 30.0–36.0)
MCV: 93 fl (ref 78.0–100.0)
Monocytes Absolute: 1.1 10*3/uL — ABNORMAL HIGH (ref 0.1–1.0)
Monocytes Relative: 8.2 % (ref 3.0–12.0)
Neutro Abs: 8.5 10*3/uL — ABNORMAL HIGH (ref 1.4–7.7)
Neutrophils Relative %: 65 % (ref 43.0–77.0)
Platelets: 263 10*3/uL (ref 150.0–400.0)
RBC: 4.9 Mil/uL (ref 3.87–5.11)
RDW: 13 % (ref 11.5–15.5)
WBC: 13 10*3/uL — ABNORMAL HIGH (ref 4.0–10.5)

## 2022-08-18 LAB — LIPID PANEL
Cholesterol: 119 mg/dL (ref 0–200)
HDL: 52.7 mg/dL (ref >39.00)
LDL Cholesterol: 57 mg/dL (ref 0–99)
NonHDL: 66.48
Total CHOL/HDL Ratio: 2
Triglycerides: 46 mg/dL (ref 0.0–149.0)
VLDL: 9.2 mg/dL (ref 0.0–40.0)

## 2022-08-18 LAB — HEPATIC FUNCTION PANEL
ALT: 32 U/L (ref 0–35)
AST: 18 U/L (ref 0–37)
Albumin: 3.5 g/dL (ref 3.5–5.2)
Alkaline Phosphatase: 60 U/L (ref 39–117)
Bilirubin, Direct: 0.1 mg/dL (ref 0.0–0.3)
Total Bilirubin: 0.7 mg/dL (ref 0.2–1.2)
Total Protein: 6.8 g/dL (ref 6.0–8.3)

## 2022-08-18 LAB — TSH: TSH: 2.7 u[IU]/mL (ref 0.35–5.50)

## 2022-08-18 NOTE — Telephone Encounter (Signed)
I called the patient and offered a cancellation for Friday. Nichole Smith said that Nichole Smith was not able to do that day and asked if Nichole Smith could see another provider. Scheduled with Dr Glennon Mac on Wednesday.

## 2022-08-18 NOTE — Progress Notes (Signed)
Surveillance Chest x ray  shows no acute findings or change  from baseline x ray

## 2022-08-20 ENCOUNTER — Ambulatory Visit (INDEPENDENT_AMBULATORY_CARE_PROVIDER_SITE_OTHER): Payer: Medicare Other | Admitting: Sports Medicine

## 2022-08-20 VITALS — BP 126/76 | HR 69 | Ht 59.0 in | Wt 213.0 lb

## 2022-08-20 DIAGNOSIS — M722 Plantar fascial fibromatosis: Secondary | ICD-10-CM

## 2022-08-20 DIAGNOSIS — M79671 Pain in right foot: Secondary | ICD-10-CM | POA: Diagnosis not present

## 2022-08-20 MED ORDER — MELOXICAM 15 MG PO TABS: 15.0000 mg | ORAL_TABLET | Freq: Every day | ORAL | 0 refills | Status: DC

## 2022-08-20 NOTE — Progress Notes (Signed)
Nichole Smith D.[place] Phone: [phone]   Assessment and Plan:     1. Right foot pain 2. Plantar fasciitis -Chronic with exacerbation, initial sports medicine visit - Chronic right foot pain most consistent with plantars fasciitis based off of location of pain on physical exam, however patient's pain is generally worse with activity and patient does not singled out morning or first few steps as most significant time of pain.  Patient did receive relief of back pain and radicular pain into right leg after lumbar epidural, however symptoms have not changed in her foot since this injection, so I suspect this is a separate issue.  Differential includes metatarsalgia, however patient is only tender on plantar surface of foot and not dorsum of foot along metatarsals. - Recommend getting insert for Planter fasciitis - Start HEP for Planter fasciitis - Start meloxicam 15 mg daily x2 weeks.  If still having pain after 2 weeks, complete 3rd-week of meloxicam. May use remaining meloxicam as needed once daily for pain control.  Do not to use additional NSAIDs while taking meloxicam.  May use Tylenol [phone] mg 2 to 3 times a day for breakthrough pain.  Other orders - meloxicam (MOBIC) 15 MG tablet; Take 1 tablet (15 mg total) by mouth daily.     Pertinent previous records reviewed include right foot x-ray 04/10/2020, lumbar epidural procedure note 08/11/2022, lumbar epidural procedure note 04/09/2022   Follow Up: 1 week for reevaluation.  If no improvement or worsening of symptoms, could consider plantar fascial CSI prior to patient's vacation to [place] next month   Subjective:   I, Nichole Smith, am serving as a Education administrator for Doctor Glennon Mac  Chief Complaint: right foot pain   HPI:   08/20/22 Patient is a 66 year old female complaining of right foot pain. Patient states that she had epidural last Monday  pain radiates down from low back to her foot, states that pain is getting worse and worse , is using spinco inserts , been going on for months , the pain is in the ball and the instep of the foot, wants to be more active, want a plan , taking tylenol off and on for the pain and does not help at all,   Relevant Historical Information: Hypertension  Additional pertinent review of systems negative.   Current Outpatient Medications:    meloxicam (MOBIC) 15 MG tablet, Take 1 tablet (15 mg total) by mouth daily., Disp: 30 tablet, Rfl: 0   aspirin EC 81 MG tablet, Take 81 mg by mouth at bedtime., Disp: , Rfl:    atorvastatin (LIPITOR) 40 MG tablet, Take 1 tablet (40 mg total) by mouth daily., Disp: 90 tablet, Rfl: 3   CALCIUM PO, Take 1 tablet by mouth at bedtime. , Disp: , Rfl:    chlorthalidone (HYGROTON) 25 MG tablet, Take 1 tablet (25 mg total) by mouth daily., Disp: 90 tablet, Rfl: 3   denosumab (PROLIA) 60 MG/ML SOSY injection, Inject 60 mg into the skin every 6 (six) months., Disp: , Rfl:    diltiazem (CARDIZEM CD) 180 MG 24 hr capsule, Take 1 capsule (180 mg total) by mouth daily., Disp: 90 capsule, Rfl: 3   diltiazem (CARDIZEM) 30 MG tablet, 1 tablet every 6 hours for palpitations, Disp: 60 tablet, Rfl: 3   Dulaglutide (TRULICITY) 4.5 VZ/5.6LO SOPN, Inject 4.5 mg as directed once a week., Disp: 6 mL, Rfl: 0   esomeprazole (  NEXIUM) 40 MG capsule, Take 40 mg by mouth daily at 12 noon., Disp: , Rfl:    estradiol (ESTRACE VAGINAL) 0.1 MG/GM vaginal cream, INSERT 1 GRAM VAGINALLY TWICE WEEKLY, Disp: 42.5 g, Rfl: 3   gabapentin (NEURONTIN) 300 MG capsule, Take 1 capsule (300 mg total) by mouth at bedtime., Disp: 90 capsule, Rfl: 0   GEMTESA 75 MG TABS, Take 1 tablet by mouth daily as needed., Disp: , Rfl:    HYDROcodone bit-homatropine (HYCODAN) 5-1.5 MG/5ML syrup, Take 5 mLs by mouth every 8 (eight) hours as needed for cough. At night, Disp: 120 mL, Rfl: 0   metoprolol succinate (TOPROL-XL) 50 MG  24 hr tablet, Take 1 tablet (50 mg total) by mouth daily., Disp: 90 tablet, Rfl: 3   Multiple Vitamins-Minerals (PRESERVISION AREDS 2 PO), Take 1 tablet by mouth 2 (two) times daily., Disp: , Rfl:    naproxen sodium (ALEVE) 220 MG tablet, Take 220 mg by mouth 2 (two) times daily as needed (for pain.)., Disp: , Rfl:    nitrofurantoin, macrocrystal-monohydrate, (MACROBID) 100 MG capsule, Take 1 capsule (100 mg total) by mouth daily., Disp: 90 capsule, Rfl: 3   sulfamethoxazole-trimethoprim (BACTRIM DS) 800-160 MG tablet, Take 1 tablet by mouth 2 (two) times daily., Disp: 10 tablet, Rfl: 1   SYNTHROID 137 MCG tablet, TAKE 1 TABLET DAILY (NEED TO SCHEDULE AN APPOINTMENT FOR MORE REFILLS), Disp: 90 tablet, Rfl: 3   Objective:     Vitals:   08/20/22 0833  BP: 126/76  Pulse: 69  SpO2: 97%  Weight: 213 lb (96.6 kg)  Height: 4\' 11"  (1.499 m)      Body mass index is 43.02 kg/m.    Physical Exam:    Gen: Appears well, nad, nontoxic and pleasant Psych: Alert and oriented, appropriate mood and affect Neuro: sensation intact, strength is 5/5 with df/pf/inv/ev, muscle tone wnl Skin: no susupicious lesions or rashes  Right foot/ankle: no deformity, no swelling or effusion NTTP over fibular head, lat mal, medial mal, achilles, navicular, base of 5th, ATFL, CFL, deltoid, calcaneous or midfoot ROM DF 30, PF 45, inv/ev intact Negative ant drawer, talar tilt, rotation test, squeeze test. Neg thompson No pain with resisted inversion or eversion  Pain with metatarsal squeeze test TTP along medial and midportion of plantar fascia  Electronically signed by:  Nichole Smith D.[facility] 9:06 AM 08/20/22

## 2022-08-20 NOTE — Patient Instructions (Addendum)
Good to see you  Recommend going to [facility] or similar for plantar fascitis inserts Plantar fascitis HEP  - Start meloxicam 15 mg daily x2 weeks.  If still having pain after 2 weeks, complete 3rd-week of meloxicam. May use remaining meloxicam as needed once daily for pain control.  Do not to use additional NSAIDs while taking meloxicam.  May use Tylenol [phone] mg 2 to 3 times a day for breakthrough pain. Follow up next Thursday

## 2022-08-23 NOTE — Progress Notes (Signed)
Blood work stable or normal except WBC is up slightly at this time.  Uncertain why but can discuss at follow-up visit can be from a number of reasons including acute illness number of other reasons   that often resolve on their own.

## 2022-08-26 ENCOUNTER — Encounter: Payer: Self-pay | Admitting: Internal Medicine

## 2022-08-26 DIAGNOSIS — D72829 Elevated white blood cell count, unspecified: Secondary | ICD-10-CM

## 2022-08-27 ENCOUNTER — Ambulatory Visit: Payer: Self-pay | Admitting: Nurse Practitioner

## 2022-08-27 NOTE — Progress Notes (Unsigned)
Benito Mccreedy D.[place] Phone: [phone]   Assessment and Plan:     There are no diagnoses linked to this encounter.  ***   Pertinent previous records reviewed include ***   Follow Up: ***     Subjective:   I, Mckinzey Entwistle, am serving as a Education administrator for Doctor Glennon Mac   Chief Complaint: right foot pain    HPI:    08/20/22 Patient is a 66 year old female complaining of right foot pain. Patient states that she had epidural last Monday pain radiates down from low back to her foot, states that pain is getting worse and worse , is using spinco inserts , been going on for months , the pain is in the ball and the instep of the foot, wants to be more active, want a plan , taking tylenol off and on for the pain and does not help at all,   08/28/2022 Patient states   Relevant Historical Information: Hypertension  Additional pertinent review of systems negative.   Current Outpatient Medications:    aspirin EC 81 MG tablet, Take 81 mg by mouth at bedtime., Disp: , Rfl:    atorvastatin (LIPITOR) 40 MG tablet, Take 1 tablet (40 mg total) by mouth daily., Disp: 90 tablet, Rfl: 3   CALCIUM PO, Take 1 tablet by mouth at bedtime. , Disp: , Rfl:    chlorthalidone (HYGROTON) 25 MG tablet, Take 1 tablet (25 mg total) by mouth daily., Disp: 90 tablet, Rfl: 3   denosumab (PROLIA) 60 MG/ML SOSY injection, Inject 60 mg into the skin every 6 (six) months., Disp: , Rfl:    diltiazem (CARDIZEM CD) 180 MG 24 hr capsule, Take 1 capsule (180 mg total) by mouth daily., Disp: 90 capsule, Rfl: 3   diltiazem (CARDIZEM) 30 MG tablet, 1 tablet every 6 hours for palpitations, Disp: 60 tablet, Rfl: 3   Dulaglutide (TRULICITY) 4.5 UJ/8.1XB SOPN, Inject 4.5 mg as directed once a week., Disp: 6 mL, Rfl: 0   esomeprazole (NEXIUM) 40 MG capsule, Take 40 mg by mouth daily at 12 noon., Disp: , Rfl:    estradiol (ESTRACE VAGINAL)  0.1 MG/GM vaginal cream, INSERT 1 GRAM VAGINALLY TWICE WEEKLY, Disp: 42.5 g, Rfl: 3   gabapentin (NEURONTIN) 300 MG capsule, Take 1 capsule (300 mg total) by mouth at bedtime., Disp: 90 capsule, Rfl: 0   GEMTESA 75 MG TABS, Take 1 tablet by mouth daily as needed., Disp: , Rfl:    HYDROcodone bit-homatropine (HYCODAN) 5-1.5 MG/5ML syrup, Take 5 mLs by mouth every 8 (eight) hours as needed for cough. At night, Disp: 120 mL, Rfl: 0   meloxicam (MOBIC) 15 MG tablet, Take 1 tablet (15 mg total) by mouth daily., Disp: 30 tablet, Rfl: 0   metoprolol succinate (TOPROL-XL) 50 MG 24 hr tablet, Take 1 tablet (50 mg total) by mouth daily., Disp: 90 tablet, Rfl: 3   Multiple Vitamins-Minerals (PRESERVISION AREDS 2 PO), Take 1 tablet by mouth 2 (two) times daily., Disp: , Rfl:    naproxen sodium (ALEVE) 220 MG tablet, Take 220 mg by mouth 2 (two) times daily as needed (for pain.)., Disp: , Rfl:    nitrofurantoin, macrocrystal-monohydrate, (MACROBID) 100 MG capsule, Take 1 capsule (100 mg total) by mouth daily., Disp: 90 capsule, Rfl: 3   sulfamethoxazole-trimethoprim (BACTRIM DS) 800-160 MG tablet, Take 1 tablet by mouth 2 (two) times daily., Disp: 10 tablet, Rfl: 1   SYNTHROID  137 MCG tablet, TAKE 1 TABLET DAILY (NEED TO SCHEDULE AN APPOINTMENT FOR MORE REFILLS), Disp: 90 tablet, Rfl: 3   Objective:     There were no vitals filed for this visit.    There is no height or weight on file to calculate BMI.    Physical Exam:    ***   Electronically signed by:  Benito Mccreedy D.[facility] 8:05 AM 08/27/22

## 2022-08-28 ENCOUNTER — Ambulatory Visit (INDEPENDENT_AMBULATORY_CARE_PROVIDER_SITE_OTHER): Payer: Medicare Other | Admitting: Sports Medicine

## 2022-08-28 ENCOUNTER — Encounter (HOSPITAL_BASED_OUTPATIENT_CLINIC_OR_DEPARTMENT_OTHER): Payer: Self-pay | Admitting: *Deleted

## 2022-08-28 VITALS — BP 130/80 | HR 85 | Ht 59.0 in | Wt 214.0 lb

## 2022-08-28 DIAGNOSIS — M79671 Pain in right foot: Secondary | ICD-10-CM | POA: Diagnosis not present

## 2022-08-28 DIAGNOSIS — M7671 Peroneal tendinitis, right leg: Secondary | ICD-10-CM

## 2022-08-28 DIAGNOSIS — M722 Plantar fascial fibromatosis: Secondary | ICD-10-CM

## 2022-08-28 NOTE — Patient Instructions (Addendum)
Good to see you Ankle HEP Discontinue daily meloxicam in 1 week and use remainder as needed Keep follow up with dr. Tamala Julian

## 2022-08-28 NOTE — Progress Notes (Signed)
[facility] Summary Report / Loop Recorder 

## 2022-08-31 ENCOUNTER — Other Ambulatory Visit: Payer: Self-pay | Admitting: Internal Medicine

## 2022-09-02 ENCOUNTER — Encounter: Payer: Self-pay | Admitting: Nurse Practitioner

## 2022-09-08 ENCOUNTER — Ambulatory Visit: Payer: Medicare Other | Admitting: Nurse Practitioner

## 2022-09-08 NOTE — Progress Notes (Unsigned)
[place] Phone: [phone] Subjective:   [name], am serving as a scribe for Dr. Hulan Saas.  I'm seeing this patient by the request  of:  Panosh, Standley Brooking, MD  CC: Back pain follow-up, Ankle pain  [id number]  07/24/2022 Seen for back pain. Did see Dr Glennon Mac 9/28 for right foot pain  Updated 09/09/2022 Nichole Smith is a 66 y.o. female coming in with complaint of back pain. Epidural on 08/11/2022. Gabapentin increased to 300mg  last visit. Patient states that epidural helped alleviate her pain. Took away most of her pain and then she spent a week a doing almost nothing due to foot pain. Still wonders if hamstring is going to hurt.   Patient is having pain over lateral aspect of R foot. Got some new orthotics for plantar fasciitis which she saw Dr. Glennon Mac for on 9/20. She then rolled ankle one week ago on a pebble during a hike. Plantar fasciitis pain has improved.         Past Medical History:  Diagnosis Date   Arthritis    knees, back   Bursitis    hips - almost gone per pt    Cancer ([place])    Carcinoid tumor of lung 2012   right found incidentally on chest xray eval for r/o vasxculitis    Carpal tunnel syndrome, bilateral    bilat but surgery to correct    Coronary artery calcification 07/23/2016   Double vision    history, no current problem   Dysrhythmia    irregular occassional, no current problem   Frequent UTI    HNP (herniated nucleus pulposus), cervical 01/22/2011   C6-C7  Has seen dr Vertell Limber for this and Dr Tonita Cong  Positional numbness in hands no weakness     Hypertension    Hypothyroidism    Infertility, female    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  Download 08/2014:  Good compliance, breakthru apnea on 10cm.  Changed to auto 5-12cm >> good control of AHI on f/u download ONO on CPAP:        Osteoarthritis    PCOS (polycystic ovarian syndrome)    Sleep apnea    wears cpap    SUI (stress urinary incontinence, female)    h/o   Past Surgical History:  Procedure Laterality Date   ANTERIOR CERVICAL DECOMP/DISCECTOMY FUSION  12/24/2017   Procedure: Cervical five-six Cervical six-seven Anterior cervical decompression/discectomy/fusion;  Surgeon: Erline Levine, MD;  Location: [facility];  Service: Neurosurgery;;   CARPAL TUNNEL RELEASE Left 12/24/2017   Procedure: LEFT CARPAL TUNNEL RELEASE;  Surgeon: Erline Levine, MD;  Location: [facility];  Service: Neurosurgery;  Laterality: Left;   CESAREAN SECTION     x 1 - twins   COLONOSCOPY     DILATATION & CURRETTAGE/HYSTEROSCOPY WITH RESECTOCOPE N/A 07/18/2013   Procedure: [place];  Surgeon: Lyman Speller, MD;  Location: [facility];  Service: Gynecology;  Laterality: N/A;  mass resection   implantable loop recorder placement  05/20/2021   Medtronic Reveal Linq model [id number] implantable loop recorder (SN [id number])   Lung tumor removed     Rt lung carcinoid   MOUTH SURGERY     pre-cancerous ulcer removed   POLYPECTOMY     SHOULDER OPEN ROTATOR CUFF REPAIR  07/20/2010   Right   WISDOM TOOTH EXTRACTION  Social History   Socioeconomic History   Marital status: Married    Spouse name: [name]   Number of children: 2   Years of education: Not on file   Highest education level: Bachelor's degree (e.g., BA, AB, BS)  Occupational History   Occupation: retired    Fish farm manager: [employer]  Tobacco Use   Smoking status: Never   Smokeless tobacco: Never  Vaping Use   Vaping Use: Never used  Substance and Sexual Activity   Alcohol use: Not Currently   Drug use: No   Sexual activity: Not Currently    Partners: Male    Birth control/protection: Post-menopausal  Other Topics Concern   Not on file  Social History Narrative   hhof 2    2 Children at college and  beyond       [name]    Married    Retired age 56 2015   Recently moved taking care of grandchildren during the week.   Social Determinants of Health   Financial Resource Strain: Low Risk  (06/05/2022)   Overall Financial Resource Strain (CARDIA)    Difficulty of Paying Living Expenses: Not hard at all  Food Insecurity: No Food Insecurity (06/05/2022)   Hunger Vital Sign    Worried About Running Out of Food in the Last Year: Never true    Ran Out of Food in the Last Year: Never true  Transportation Needs: No Transportation Needs (06/05/2022)   PRAPARE - Hydrologist (Medical): No    Lack of Transportation (Non-Medical): No  Physical Activity: Sufficiently Active (06/05/2022)   Exercise Vital Sign    Days of Exercise per Week: 5 days    Minutes of Exercise per Session: 30 min  Stress: No Stress Concern Present (06/05/2022)   [place]    Feeling of Stress : Only a little  Social Connections: Socially Integrated (06/05/2022)   Social Connection and Isolation Panel [NHANES]    Frequency of Communication with Friends and Family: Twice a week    Frequency of Social Gatherings with Friends and Family: Once a week    Attends Religious Services: More than 4 times per year    Active Member of [employer] or Organizations: Yes    Attends Music therapist: More than 4 times per year    Marital Status: Married   Allergies  Allergen Reactions   Neomycin Other (See Comments)    Inflammation--eye drop    Ace Inhibitors Cough   Family History  Problem Relation Age of Onset   Stroke Mother        died age 67   Hypertension Mother    Sudden death Mother    Alcoholism Mother    Coronary artery disease Father        died age 37   Hypertension Father    Liver disease Father    Alcohol abuse Father    Obesity Father    Parkinson's disease Brother    Diabetes Brother    Hypertension Brother     Hypertension Maternal Uncle    Colon cancer Neg Hx    Rectal cancer Neg Hx    Stomach cancer Neg Hx    Colon polyps Neg Hx    Esophageal cancer Neg Hx     Current Outpatient Medications (Endocrine & Metabolic):    denosumab (PROLIA) 60 MG/ML SOSY injection, Inject 60 mg into the skin every 6 (six) months.  Dulaglutide (TRULICITY) 4.5 OL/0.7EM SOPN, Inject 4.5 mg as directed once a week.   SYNTHROID 137 MCG tablet, TAKE 1 TABLET DAILY (NEED TO SCHEDULE AN APPOINTMENT FOR MORE REFILLS)  Current Outpatient Medications (Cardiovascular):    atorvastatin (LIPITOR) 40 MG tablet, Take 1 tablet (40 mg total) by mouth daily.   chlorthalidone (HYGROTON) 25 MG tablet, Take 1 tablet (25 mg total) by mouth daily.   diltiazem (CARDIZEM CD) 180 MG 24 hr capsule, Take 1 capsule (180 mg total) by mouth daily.   diltiazem (CARDIZEM) 30 MG tablet, 1 tablet every 6 hours for palpitations   metoprolol succinate (TOPROL-XL) 50 MG 24 hr tablet, Take 1 tablet (50 mg total) by mouth daily.  Current Outpatient Medications (Respiratory):    HYDROcodone bit-homatropine (HYCODAN) 5-1.5 MG/5ML syrup, Take 5 mLs by mouth every 8 (eight) hours as needed for cough. At night  Current Outpatient Medications (Analgesics):    aspirin EC 81 MG tablet, Take 81 mg by mouth at bedtime.   meloxicam (MOBIC) 15 MG tablet, Take 1 tablet (15 mg total) by mouth daily.   naproxen sodium (ALEVE) 220 MG tablet, Take 220 mg by mouth 2 (two) times daily as needed (for pain.).   Current Outpatient Medications (Other):    CALCIUM PO, Take 1 tablet by mouth at bedtime.    esomeprazole (NEXIUM) 40 MG capsule, Take 40 mg by mouth daily at 12 noon.   estradiol (ESTRACE VAGINAL) 0.1 MG/GM vaginal cream, INSERT 1 GRAM VAGINALLY TWICE WEEKLY   gabapentin (NEURONTIN) 300 MG capsule, Take 1 capsule (300 mg total) by mouth at bedtime.   GEMTESA 75 MG TABS, Take 1 tablet by mouth daily as needed.   Multiple Vitamins-Minerals (PRESERVISION AREDS  2 PO), Take 1 tablet by mouth 2 (two) times daily.   nitrofurantoin, macrocrystal-monohydrate, (MACROBID) 100 MG capsule, Take 1 capsule (100 mg total) by mouth daily.   sulfamethoxazole-trimethoprim (BACTRIM DS) 800-160 MG tablet, Take 1 tablet by mouth 2 (two) times daily.   Reviewed prior external information including notes and imaging from  primary care provider As well as notes that were available from care everywhere and other healthcare systems.  Past medical history, social, surgical and family history all reviewed in electronic medical record.  No pertanent information unless stated regarding to the chief complaint.   Review of Systems:  No headache, visual changes, nausea, vomiting, diarrhea, constipation, dizziness, abdominal pain, skin rash, fevers, chills, night sweats, weight loss, swollen lymph nodes, body aches, joint swelling, chest pain, shortness of breath, mood changes. POSITIVE muscle aches  Objective  Blood pressure 112/84, pulse 69, height 4\' 11"  (1.499 m), weight 217 lb (98.4 kg), last menstrual period 02/12/2011, SpO2 97 %.   General: No apparent distress alert and oriented x3 mood and affect normal, dressed appropriately.  HEENT: Pupils equal, extraocular movements intact  Respiratory: Patient's speak in full sentences and does not appear short of breath  Cardiovascular: No lower extremity edema, non tender, no erythema  Severely antalgic gait noted.  Patient does have swelling of the right ankle compared to the contralateral side.  Limited range of motion in all planes.  Most tender over the ATFL and the lateral malleolus.  Limited muscular skeletal ultrasound was performed and interpreted by Hulan Saas, M  Limited exam shows have what appears to be an avulsion fracture noted of the lateral malleolus with a cortical defect noted.  Patient does have some increased flow.  Hypoechoic changes also noted near the talar dome but no cortical  irregularity  noted. Impression: Avulsion fracture of lateral malleolus    Impression and Recommendations:    The above documentation has been reviewed and is accurate and complete Lyndal Pulley, DO

## 2022-09-09 ENCOUNTER — Ambulatory Visit (INDEPENDENT_AMBULATORY_CARE_PROVIDER_SITE_OTHER): Payer: Medicare Other | Admitting: Family Medicine

## 2022-09-09 ENCOUNTER — Ambulatory Visit (INDEPENDENT_AMBULATORY_CARE_PROVIDER_SITE_OTHER): Payer: Medicare Other

## 2022-09-09 ENCOUNTER — Ambulatory Visit: Payer: Self-pay

## 2022-09-09 VITALS — BP 112/84 | HR 69 | Ht 59.0 in | Wt 217.0 lb

## 2022-09-09 DIAGNOSIS — I2584 Coronary atherosclerosis due to calcified coronary lesion: Secondary | ICD-10-CM | POA: Diagnosis not present

## 2022-09-09 DIAGNOSIS — S8261XA Displaced fracture of lateral malleolus of right fibula, initial encounter for closed fracture: Secondary | ICD-10-CM

## 2022-09-09 DIAGNOSIS — M79671 Pain in right foot: Secondary | ICD-10-CM

## 2022-09-09 DIAGNOSIS — S8263XA Displaced fracture of lateral malleolus of unspecified fibula, initial encounter for closed fracture: Secondary | ICD-10-CM | POA: Insufficient documentation

## 2022-09-09 DIAGNOSIS — I2510 Atherosclerotic heart disease of native coronary artery without angina pectoris: Secondary | ICD-10-CM | POA: Diagnosis not present

## 2022-09-09 NOTE — Patient Instructions (Signed)
[facility]  Xray today Start exercises in 10 days See me again in 4 weeks

## 2022-09-09 NOTE — Assessment & Plan Note (Addendum)
Patient does have more of a lateral malleolus fracture noted.  Patient does have some hypoechoic changes of the talar dome that we will need to monitor as well.  Would put patient in a cam walker but do not want to seem to aggravate patient's back if possible.  Discussed with patient about icing regimen, home exercises, which activities to do and which ones to avoid.  Increase activity slowly otherwise.  Follow-up with me again in 6 to 8 weeks.  X-rays pending as well to make sure that there is no other significant changes or bony abnormality that would be contributing.

## 2022-09-11 LAB — CUP PACEART REMOTE DEVICE CHECK
Date Time Interrogation Session: 20231010231159
Implantable Pulse Generator Implant Date: 20220620

## 2022-09-14 ENCOUNTER — Encounter: Payer: Self-pay | Admitting: Family Medicine

## 2022-09-14 NOTE — Telephone Encounter (Signed)
Looks like we missed each other .  Wbc not concerning based on history  but  unless new symptoms of problems  would wait at least a  month and repeat cbc diff. Please arrange  cbc diff  dx  elevated WBC

## 2022-09-15 ENCOUNTER — Ambulatory Visit (INDEPENDENT_AMBULATORY_CARE_PROVIDER_SITE_OTHER): Payer: Medicare Other

## 2022-09-15 DIAGNOSIS — I480 Paroxysmal atrial fibrillation: Secondary | ICD-10-CM

## 2022-09-16 ENCOUNTER — Ambulatory Visit (INDEPENDENT_AMBULATORY_CARE_PROVIDER_SITE_OTHER): Payer: Medicare Other | Admitting: Adult Health

## 2022-09-16 ENCOUNTER — Encounter (INDEPENDENT_AMBULATORY_CARE_PROVIDER_SITE_OTHER): Payer: Self-pay | Admitting: Adult Health

## 2022-09-16 VITALS — BP 132/82 | HR 63 | Temp 98.2°F | Ht 59.0 in | Wt 209.0 lb

## 2022-09-16 DIAGNOSIS — E559 Vitamin D deficiency, unspecified: Secondary | ICD-10-CM

## 2022-09-16 DIAGNOSIS — R7303 Prediabetes: Secondary | ICD-10-CM | POA: Diagnosis not present

## 2022-09-16 DIAGNOSIS — E669 Obesity, unspecified: Secondary | ICD-10-CM

## 2022-09-16 DIAGNOSIS — Z6841 Body Mass Index (BMI) 40.0 and over, adult: Secondary | ICD-10-CM | POA: Diagnosis not present

## 2022-09-16 MED ORDER — TRULICITY 4.5 MG/0.5ML ~~LOC~~ SOAJ: 4.5000 mg | SUBCUTANEOUS | 0 refills | Status: DC

## 2022-09-18 LAB — INSULIN, RANDOM: INSULIN: 22.2 u[IU]/mL (ref 2.6–24.9)

## 2022-09-18 LAB — VITAMIN D 25 HYDROXY (VIT D DEFICIENCY, FRACTURES): Vit D, 25-Hydroxy: 52.5 ng/mL (ref 30.0–100.0)

## 2022-09-23 NOTE — Progress Notes (Unsigned)
Chief Complaint:   OBESITY Nichole Smith is here to discuss her progress with her obesity treatment plan along with follow-up of her obesity related diagnoses. Sammi is on keeping a food journal and adhering to recommended goals of 1500 calories and 90 protein and states she is following her eating plan approximately 70% of the time. Mandeep states she is not exercising due to a broken ankle.   Today's visit was #: 21 Starting weight: 238 lbs Starting date: 01/12/2019 Today's weight: 209 lbs Today's date: 09/16/2022 Total lbs lost to date: 29 lbs Total lbs lost since last in-office visit: 3 lbs  Interim History: She was unable to complete IC at office visit today.   PCP recently completed labs 08/18/2022.   She traveled for the first time to [place] and is thrilled to have lost 3 lbs.   Subjective:   1. Prediabetes Lab Results  Component Value Date   HGBA1C 5.7 08/18/2022   HGBA1C 5.5 05/13/2022   HGBA1C 5.5 10/03/2021   Antidiabetic medication HX: Previously on Metformin 500mg  BID. Insurance would not cover Rybelsus or Oazmpeic. Started on Truilicity 0/1751. She is currently on Trulicity 4.5mg  once weekly injection.  She denies mass in neck, dysphagia, dyspepsia, persistent hoarseness, abdominal pain, or N/V/Constipation.  2. Vitamin D deficiency She is on OTC Vitamin D-3, unknown of dose and OTC multivitamin.  Assessment/Plan:   1. Prediabetes Refill - Dulaglutide (TRULICITY) 4.5 WC/5.8NI SOPN; Inject 4.5 mg as directed once a week.  Dispense: 6 mL; Refill: 0  Check labs- Insulin, random  2. Vitamin D deficiency Check dosage of OTC Vitamin D-3.   Check labs- VITAMIN D 25 Hydroxy (Vit-D Deficiency, Fractures)  3. Obesity BMI today is 42.2 Check IC at next office visit, pt is aware to arrive 30 minutes prior to appointment time.   Nichole Smith is currently in the action stage of change. As such, her goal is to continue with weight loss efforts. She has agreed to  keeping a food journal and adhering to recommended goals of 1500 calories and 90 protein daily.   Exercise goals: No exercise has been prescribed at this time.  Behavioral modification strategies: increasing lean protein intake, decreasing simple carbohydrates, meal planning and cooking strategies, keeping healthy foods in the home, and planning for success.  Nichole Smith has agreed to follow-up with our clinic in 4 weeks. She was informed of the importance of frequent follow-up visits to maximize her success with intensive lifestyle modifications for her multiple health conditions.   Objective:   Blood pressure 132/82, pulse 63, temperature 98.2 F (36.8 C), height 4\' 11"  (1.499 m), weight 209 lb (94.8 kg), last menstrual period 02/12/2011, SpO2 94 %. Body mass index is 42.21 kg/m.  General: Cooperative, alert, well developed, in no acute distress. HEENT: Conjunctivae and lids unremarkable. Cardiovascular: Regular rhythm.  Lungs: Normal work of breathing. Neurologic: No focal deficits.   Lab Results  Component Value Date   CREATININE 0.62 05/13/2022   BUN 21 05/13/2022   NA 142 05/13/2022   K 3.7 05/13/2022   CL 99 05/13/2022   CO2 24 05/13/2022   Lab Results  Component Value Date   ALT 32 08/18/2022   AST 18 08/18/2022   ALKPHOS 60 08/18/2022   BILITOT 0.7 08/18/2022   Lab Results  Component Value Date   HGBA1C 5.7 08/18/2022   HGBA1C 5.5 05/13/2022   HGBA1C 5.5 10/03/2021   HGBA1C 5.3 06/25/2021   HGBA1C 5.4 06/27/2020   Lab Results  Component  Value Date   INSULIN 22.2 09/16/2022   INSULIN 12.2 05/13/2022   INSULIN 13.6 10/03/2021   INSULIN 12.2 06/25/2021   INSULIN 13.7 06/27/2020   Lab Results  Component Value Date   TSH 2.70 08/18/2022   Lab Results  Component Value Date   CHOL 119 08/18/2022   HDL 52.70 08/18/2022   LDLCALC 57 08/18/2022   TRIG 46.0 08/18/2022   CHOLHDL 2 08/18/2022   Lab Results  Component Value Date   VD25OH 52.5 09/16/2022    VD25OH 42.3 05/13/2022   VD25OH 62.5 10/03/2021   Lab Results  Component Value Date   WBC 13.0 (H) 08/18/2022   HGB 15.5 (H) 08/18/2022   HCT 45.6 08/18/2022   MCV 93.0 08/18/2022   PLT 263.0 08/18/2022   No results found for: "IRON", "TIBC", "FERRITIN"  Attestation Statements:   Reviewed by clinician on day of visit: allergies, medications, problem list, medical history, surgical history, family history, social history, and previous encounter notes.  I, Davy Pique, RMA, am acting as Location manager for Mina Marble, NP.  I have reviewed the above documentation for accuracy and completeness, and I agree with the above. -  Katy d. Danford, NP-C

## 2022-09-24 ENCOUNTER — Other Ambulatory Visit: Payer: Medicare Other

## 2022-09-24 ENCOUNTER — Other Ambulatory Visit (INDEPENDENT_AMBULATORY_CARE_PROVIDER_SITE_OTHER): Payer: Medicare Other

## 2022-09-24 DIAGNOSIS — D72829 Elevated white blood cell count, unspecified: Secondary | ICD-10-CM | POA: Diagnosis not present

## 2022-09-24 LAB — CBC WITH DIFFERENTIAL/PLATELET
Basophils Absolute: 0 10*3/uL (ref 0.0–0.1)
Basophils Relative: 0.4 % (ref 0.0–3.0)
Eosinophils Absolute: 0.2 10*3/uL (ref 0.0–0.7)
Eosinophils Relative: 2 % (ref 0.0–5.0)
HCT: 45.7 % (ref 36.0–46.0)
Hemoglobin: 15.4 g/dL — ABNORMAL HIGH (ref 12.0–15.0)
Lymphocytes Relative: 22.6 % (ref 12.0–46.0)
Lymphs Abs: 2.3 10*3/uL (ref 0.7–4.0)
MCHC: 33.6 g/dL (ref 30.0–36.0)
MCV: 92.7 fl (ref 78.0–100.0)
Monocytes Absolute: 0.8 10*3/uL (ref 0.1–1.0)
Monocytes Relative: 7.8 % (ref 3.0–12.0)
Neutro Abs: 6.8 10*3/uL (ref 1.4–7.7)
Neutrophils Relative %: 67.2 % (ref 43.0–77.0)
Platelets: 237 10*3/uL (ref 150.0–400.0)
RBC: 4.93 Mil/uL (ref 3.87–5.11)
RDW: 13.2 % (ref 11.5–15.5)
WBC: 10.1 10*3/uL (ref 4.0–10.5)

## 2022-09-24 NOTE — Progress Notes (Signed)
Wbc better and normal .  hg about the same as expected

## 2022-10-01 ENCOUNTER — Ambulatory Visit: Payer: Medicare Other | Admitting: Cardiology

## 2022-10-01 NOTE — Progress Notes (Signed)
[facility] Phone: [phone] Subjective:   [name], am serving as a scribe for Dr. Hulan Saas.  I'm seeing this patient by the request  of:  Panosh, Standley Brooking, MD  CC: Right ankle pain follow-up  [id number]  09/09/2022 Patient does have more of a lateral malleolus fracture noted.  Patient does have some hypoechoic changes of the talar dome that we will need to monitor as well.  Would put patient in a cam walker but do not want to seem to aggravate patient's back if possible.  Discussed with patient about icing regimen, home exercises, which activities to do and which ones to avoid.  Increase activity slowly otherwise.  Follow-up with me again in 6 to 8 weeks.  X-rays pending as well to make sure that there is no other significant changes or bony abnormality that would be contributing.    Update 10/07/2022 AMALEA OTTEY is a 66 y.o. female coming in with complaint of R foot pain. Patient states that she has some improvement but is still in pain. Most of her pain is over the peroneal tendon insertion. Ankle is aggravated today from her walk on Sunday. Feels unsteady and weak.   09/09/2022 R foot xray IMPRESSION: Moderate plantar calcaneal spur.     Past Medical History:  Diagnosis Date   Arthritis    knees, back   Bursitis    hips - almost gone per pt    Cancer ([place])    Carcinoid tumor of lung 2012   right found incidentally on chest xray eval for r/o vasxculitis    Carpal tunnel syndrome, bilateral    bilat but surgery to correct    Coronary artery calcification 07/23/2016   Double vision    history, no current problem   Dysrhythmia    irregular occassional, no current problem   Frequent UTI    HNP (herniated nucleus pulposus), cervical 01/22/2011   C6-C7  Has seen dr Vertell Limber for this and Dr Tonita Cong  Positional numbness in hands no weakness     Hypertension    Hypothyroidism    Infertility,  female    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  Download 08/2014:  Good compliance, breakthru apnea on 10cm.  Changed to auto 5-12cm >> good control of AHI on f/u download ONO on CPAP:       Osteoarthritis    PCOS (polycystic ovarian syndrome)    Sleep apnea    wears cpap    SUI (stress urinary incontinence, female)    h/o   Past Surgical History:  Procedure Laterality Date   ANTERIOR CERVICAL DECOMP/DISCECTOMY FUSION  12/24/2017   Procedure: Cervical five-six Cervical six-seven Anterior cervical decompression/discectomy/fusion;  Surgeon: Erline Levine, MD;  Location: [facility];  Service: Neurosurgery;;   CARPAL TUNNEL RELEASE Left 12/24/2017   Procedure: LEFT CARPAL TUNNEL RELEASE;  Surgeon: Erline Levine, MD;  Location: [facility];  Service: Neurosurgery;  Laterality: Left;   CESAREAN SECTION     x 1 - twins   COLONOSCOPY     DILATATION & CURRETTAGE/HYSTEROSCOPY WITH RESECTOCOPE N/A 07/18/2013   Procedure: [place];  Surgeon: Lyman Speller, MD;  Location: [facility];  Service: Gynecology;  Laterality: N/A;  mass resection   implantable loop recorder placement  05/20/2021   Medtronic Reveal Linq model [id number] implantable loop recorder (SN [id number])  Lung tumor removed     Rt lung carcinoid   MOUTH SURGERY     pre-cancerous ulcer removed   POLYPECTOMY     SHOULDER OPEN ROTATOR CUFF REPAIR  07/20/2010   Right   WISDOM TOOTH EXTRACTION     Social History   Socioeconomic History   Marital status: Married    Spouse name: [name]   Number of children: 2   Years of education: Not on file   Highest education level: Bachelor's degree (e.g., BA, AB, BS)  Occupational History   Occupation: retired    Fish farm manager: [employer]  Tobacco Use   Smoking status: Never   Smokeless tobacco: Never  Vaping Use   Vaping Use: Never used  Substance and  Sexual Activity   Alcohol use: Not Currently   Drug use: No   Sexual activity: Not Currently    Partners: Male    Birth control/protection: Post-menopausal  Other Topics Concern   Not on file  Social History Narrative   hhof 2    2 Children at college and  beyond      [name]    Married    Retired age 70 2015   Recently moved taking care of grandchildren during the week.   Social Determinants of Health   Financial Resource Strain: Low Risk  (06/05/2022)   Overall Financial Resource Strain (CARDIA)    Difficulty of Paying Living Expenses: Not hard at all  Food Insecurity: No Food Insecurity (06/05/2022)   Hunger Vital Sign    Worried About Running Out of Food in the Last Year: Never true    Ran Out of Food in the Last Year: Never true  Transportation Needs: No Transportation Needs (06/05/2022)   PRAPARE - Hydrologist (Medical): No    Lack of Transportation (Non-Medical): No  Physical Activity: Sufficiently Active (06/05/2022)   Exercise Vital Sign    Days of Exercise per Week: 5 days    Minutes of Exercise per Session: 30 min  Stress: No Stress Concern Present (06/05/2022)   [place]    Feeling of Stress : Only a little  Social Connections: Socially Integrated (06/05/2022)   Social Connection and Isolation Panel [NHANES]    Frequency of Communication with Friends and Family: Twice a week    Frequency of Social Gatherings with Friends and Family: Once a week    Attends Religious Services: More than 4 times per year    Active Member of [employer] or Organizations: Yes    Attends Music therapist: More than 4 times per year    Marital Status: Married   Allergies  Allergen Reactions   Neomycin Other (See Comments)    Inflammation--eye drop    Ace Inhibitors Cough   Family History  Problem Relation Age of Onset   Stroke Mother        died age 10   Hypertension Mother     Sudden death Mother    Alcoholism Mother    Coronary artery disease Father        died age 56   Hypertension Father    Liver disease Father    Alcohol abuse Father    Obesity Father    Parkinson's disease Brother    Diabetes Brother    Hypertension Brother    Hypertension Maternal Uncle    Colon cancer Neg Hx    Rectal cancer Neg Hx  Stomach cancer Neg Hx    Colon polyps Neg Hx    Esophageal cancer Neg Hx     Current Outpatient Medications (Endocrine & Metabolic):    denosumab (PROLIA) 60 MG/ML SOSY injection, Inject 60 mg into the skin every 6 (six) months.   Dulaglutide (TRULICITY) 4.5 MV/6.7MC SOPN, Inject 4.5 mg as directed once a week.   SYNTHROID 137 MCG tablet, TAKE 1 TABLET DAILY (NEED TO SCHEDULE AN APPOINTMENT FOR MORE REFILLS)  Current Outpatient Medications (Cardiovascular):    atorvastatin (LIPITOR) 40 MG tablet, Take 1 tablet (40 mg total) by mouth daily.   chlorthalidone (HYGROTON) 25 MG tablet, Take 1 tablet (25 mg total) by mouth daily.   diltiazem (CARDIZEM CD) 180 MG 24 hr capsule, Take 1 capsule (180 mg total) by mouth daily.   diltiazem (CARDIZEM) 30 MG tablet, 1 tablet every 6 hours for palpitations   metoprolol succinate (TOPROL-XL) 50 MG 24 hr tablet, Take 1 tablet (50 mg total) by mouth daily.  Current Outpatient Medications (Respiratory):    HYDROcodone bit-homatropine (HYCODAN) 5-1.5 MG/5ML syrup, Take 5 mLs by mouth every 8 (eight) hours as needed for cough. At night  Current Outpatient Medications (Analgesics):    aspirin EC 81 MG tablet, Take 81 mg by mouth at bedtime.   meloxicam (MOBIC) 15 MG tablet, Take 1 tablet (15 mg total) by mouth daily.   naproxen sodium (ALEVE) 220 MG tablet, Take 220 mg by mouth 2 (two) times daily as needed (for pain.).   Current Outpatient Medications (Other):    CALCIUM PO, Take 1 tablet by mouth at bedtime.    esomeprazole (NEXIUM) 40 MG capsule, Take 40 mg by mouth daily at 12 noon.   estradiol (ESTRACE  VAGINAL) 0.1 MG/GM vaginal cream, INSERT 1 GRAM VAGINALLY TWICE WEEKLY   gabapentin (NEURONTIN) 300 MG capsule, Take 1 capsule (300 mg total) by mouth at bedtime.   GEMTESA 75 MG TABS, Take 1 tablet by mouth daily as needed.   Multiple Vitamins-Minerals (PRESERVISION AREDS 2 PO), Take 1 tablet by mouth 2 (two) times daily.   nitrofurantoin, macrocrystal-monohydrate, (MACROBID) 100 MG capsule, Take 1 capsule (100 mg total) by mouth daily.   sulfamethoxazole-trimethoprim (BACTRIM DS) 800-160 MG tablet, Take 1 tablet by mouth 2 (two) times daily.   Reviewed prior external information including notes and imaging from  primary care provider As well as notes that were available from care everywhere and other healthcare systems.  Past medical history, social, surgical and family history all reviewed in electronic medical record.  No pertanent information unless stated regarding to the chief complaint.   Review of Systems:  No headache, visual changes, nausea, vomiting, diarrhea, constipation, dizziness, abdominal pain, skin rash, fevers, chills, night sweats, weight loss, swollen lymph nodes, body aches, joint swelling, chest pain, shortness of breath, mood changes. POSITIVE muscle aches  Objective  Blood pressure 124/86, pulse 75, height 4\' 11"  (1.499 m), weight 215 lb (97.5 kg), last menstrual period 02/12/2011, SpO2 93 %.   General: No apparent distress alert and oriented x3 mood and affect normal, dressed appropriately.  HEENT: Pupils equal, extraocular movements intact  Respiratory: Patient's speak in full sentences and does not appear short of breath  Cardiovascular: No lower extremity edema, non tender, no erythema  Right ankle exam shows the patient does have more what appears to be lateral column overload.  Tightness of the deltoid ligament noted.  Patient does have some laxity with anterior drawer noted. Severe tenderness to palpation at the insertion of  the proximal fourth metatarsal on  the peroneal's.  Swelling in the area noted.  Mild pain in the inferior aspect of the lateral malleolus.  Limited muscular skeletal ultrasound was performed and interpreted by Hulan Saas, M  Limited ultrasound of patient's right ankle shows that there is what appears to be a partial tear noted at the insertion of the fourth metatarsal of the peroneal tendon.  Increasing neovascularization in Doppler flow in the area.  Patient does have hypoechoic changes also noted at the inferior aspect of the lateral malleolus within the tendon sheath.  Does seem to have resolved of the ankle mortise inflammation that was noted previously.  Avulsion piece noted of the lateral malleolus but less hypoechoic changes surrounding the area. Impression: Likely acute peroneal tendon tear.  Resolving avulsion fracture and ankle mortise effusion.     Impression and Recommendations:    The above documentation has been reviewed and is accurate and complete Lyndal Pulley, DO

## 2022-10-07 ENCOUNTER — Ambulatory Visit: Payer: Self-pay

## 2022-10-07 ENCOUNTER — Ambulatory Visit (INDEPENDENT_AMBULATORY_CARE_PROVIDER_SITE_OTHER): Payer: Medicare Other | Admitting: Family Medicine

## 2022-10-07 ENCOUNTER — Encounter: Payer: Self-pay | Admitting: Family Medicine

## 2022-10-07 VITALS — BP 124/86 | HR 75 | Ht 59.0 in | Wt 215.0 lb

## 2022-10-07 DIAGNOSIS — I2510 Atherosclerotic heart disease of native coronary artery without angina pectoris: Secondary | ICD-10-CM

## 2022-10-07 DIAGNOSIS — I2584 Coronary atherosclerosis due to calcified coronary lesion: Secondary | ICD-10-CM | POA: Diagnosis not present

## 2022-10-07 DIAGNOSIS — S86311A Strain of muscle(s) and tendon(s) of peroneal muscle group at lower leg level, right leg, initial encounter: Secondary | ICD-10-CM | POA: Diagnosis not present

## 2022-10-07 DIAGNOSIS — M79671 Pain in right foot: Secondary | ICD-10-CM | POA: Diagnosis not present

## 2022-10-07 NOTE — Assessment & Plan Note (Signed)
Peroneal tendon tear noted.  Discussed with patient about icing regimen and home exercises, which activities to do and which ones to avoid.  We discussed heel lift, patient is going to get a lace up ankle brace that I think will be more beneficial.  Follow-up again in 4 to 6 weeks

## 2022-10-07 NOTE — Patient Instructions (Signed)
See me again in 4-6 weeks

## 2022-10-10 NOTE — Progress Notes (Signed)
[facility] Summary Report / Loop Recorder 

## 2022-10-14 ENCOUNTER — Other Ambulatory Visit: Payer: Self-pay | Admitting: Family Medicine

## 2022-10-14 ENCOUNTER — Encounter: Payer: Self-pay | Admitting: Internal Medicine

## 2022-10-14 ENCOUNTER — Ambulatory Visit (INDEPENDENT_AMBULATORY_CARE_PROVIDER_SITE_OTHER): Payer: Medicare Other | Admitting: Internal Medicine

## 2022-10-14 ENCOUNTER — Encounter: Payer: Self-pay | Admitting: Family Medicine

## 2022-10-14 ENCOUNTER — Ambulatory Visit (INDEPENDENT_AMBULATORY_CARE_PROVIDER_SITE_OTHER): Payer: Medicare Other | Admitting: Family Medicine

## 2022-10-14 VITALS — BP 122/76 | HR 61 | Temp 98.2°F | Ht 59.0 in | Wt 209.0 lb

## 2022-10-14 VITALS — BP 122/68 | HR 67 | Temp 97.8°F | Ht 59.0 in | Wt 209.0 lb

## 2022-10-14 DIAGNOSIS — Z85110 Personal history of malignant carcinoid tumor of bronchus and lung: Secondary | ICD-10-CM | POA: Diagnosis not present

## 2022-10-14 DIAGNOSIS — Z79899 Other long term (current) drug therapy: Secondary | ICD-10-CM | POA: Diagnosis not present

## 2022-10-14 DIAGNOSIS — I10 Essential (primary) hypertension: Secondary | ICD-10-CM

## 2022-10-14 DIAGNOSIS — R7303 Prediabetes: Secondary | ICD-10-CM | POA: Diagnosis not present

## 2022-10-14 DIAGNOSIS — I2510 Atherosclerotic heart disease of native coronary artery without angina pectoris: Secondary | ICD-10-CM

## 2022-10-14 DIAGNOSIS — I2584 Coronary atherosclerosis due to calcified coronary lesion: Secondary | ICD-10-CM | POA: Diagnosis not present

## 2022-10-14 DIAGNOSIS — E038 Other specified hypothyroidism: Secondary | ICD-10-CM | POA: Diagnosis not present

## 2022-10-14 DIAGNOSIS — E669 Obesity, unspecified: Secondary | ICD-10-CM | POA: Diagnosis not present

## 2022-10-14 DIAGNOSIS — D582 Other hemoglobinopathies: Secondary | ICD-10-CM

## 2022-10-14 DIAGNOSIS — Z6841 Body Mass Index (BMI) 40.0 and over, adult: Secondary | ICD-10-CM | POA: Diagnosis not present

## 2022-10-14 DIAGNOSIS — R0602 Shortness of breath: Secondary | ICD-10-CM

## 2022-10-14 DIAGNOSIS — G4733 Obstructive sleep apnea (adult) (pediatric): Secondary | ICD-10-CM

## 2022-10-14 DIAGNOSIS — E559 Vitamin D deficiency, unspecified: Secondary | ICD-10-CM

## 2022-10-14 NOTE — Progress Notes (Signed)
Chief Complaint  Patient presents with   Annual Exam    HPI: Patient  Nichole Smith  66 y.o. comes in today foryearly visit  THyroid  on synthroid  137 taking regularly no concerns  Weight loss  maintaining    today.  Had a evaluation Bmr 1600 consideration of GLP-1 medications would be appropriate.  She has prediabetes but not full-blown diabetes is following instructions guidelines and care.   R foot    still a problem and some times limits activity  Cough went away so went off ppi  doing ok Has monitor only ocass and always symptomatic  tachy x 2  Health Maintenance  Topic Date Due   Medicare Annual Wellness (AWV)  10/14/2022   COVID-19 Vaccine (6 - Moderna risk series) 11/05/2022   COLONOSCOPY (Pts 45-3yrs Insurance coverage will need to be confirmed)  09/26/2023   TETANUS/TDAP  01/31/2024   MAMMOGRAM  08/26/2024   Pneumonia Vaccine 69+ Years old  Completed   INFLUENZA VACCINE  Completed   DEXA SCAN  Completed   Hepatitis C Screening  Completed   Zoster Vaccines- Shingrix  Completed   HPV VACCINES  Aged Out   Health Maintenance Review LIFESTYLE:  Exercise:   down to 100 minutes per week  frx foot .  Tobacco/ETS: n Alcohol:  n Sugar beverages: Sleep: great  now  8   gabapentin at night  Drug use: no HH of  2      ROS:  REST of 12 system review negative except as per HPI   Past Medical History:  Diagnosis Date   Arthritis    knees, back   Bursitis    hips - almost gone per pt    Cancer ([place])    Carcinoid tumor of lung 2012   right found incidentally on chest xray eval for r/o vasxculitis    Carpal tunnel syndrome, bilateral    bilat but surgery to correct    Coronary artery calcification 07/23/2016   Double vision    history, no current problem   Dysrhythmia    irregular occassional, no current problem   Frequent UTI    HNP (herniated nucleus pulposus), cervical 01/22/2011   C6-C7  Has seen dr Vertell Limber for this and Dr Tonita Cong  Positional numbness in  hands no weakness     Hypertension    Hypothyroidism    Infertility, female    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  Download 08/2014:  Good compliance, breakthru apnea on 10cm.  Changed to auto 5-12cm >> good control of AHI on f/u download ONO on CPAP:       Osteoarthritis    PCOS (polycystic ovarian syndrome)    Sleep apnea    wears cpap    SUI (stress urinary incontinence, female)    h/o    Past Surgical History:  Procedure Laterality Date   ANTERIOR CERVICAL DECOMP/DISCECTOMY FUSION  12/24/2017   Procedure: Cervical five-six Cervical six-seven Anterior cervical decompression/discectomy/fusion;  Surgeon: Erline Levine, MD;  Location: [place];  Service: Neurosurgery;;   CARPAL TUNNEL RELEASE Left 12/24/2017   Procedure: LEFT CARPAL TUNNEL RELEASE;  Surgeon: Erline Levine, MD;  Location: [place];  Service: Neurosurgery;  Laterality: Left;   CESAREAN SECTION     x 1 - twins   COLONOSCOPY     DILATATION & CURRETTAGE/HYSTEROSCOPY WITH RESECTOCOPE N/A 07/18/2013   Procedure: DILATATION & CURETTAGE/HYSTEROSCOPY WITH RESECTOCOPE;  Surgeon: Lyman Speller, MD;  Location: [facility];  Service: Gynecology;  Laterality: N/A;  mass resection   implantable loop recorder placement  05/20/2021   Medtronic Reveal Linq model [id number] implantable loop recorder (SN [id number])   Lung tumor removed     Rt lung carcinoid   MOUTH SURGERY     pre-cancerous ulcer removed   POLYPECTOMY     SHOULDER OPEN ROTATOR CUFF REPAIR  07/20/2010   Right   WISDOM TOOTH EXTRACTION      Family History  Problem Relation Age of Onset   Stroke Mother        died age 68   Hypertension Mother    Sudden death Mother    Alcoholism Mother    Coronary artery disease Father        died age 31   Hypertension Father    Liver disease Father    Alcohol abuse Father    Obesity Father    Parkinson's disease Brother    Diabetes Brother     Hypertension Brother    Hypertension Maternal Uncle    Colon cancer Neg Hx    Rectal cancer Neg Hx    Stomach cancer Neg Hx    Colon polyps Neg Hx    Esophageal cancer Neg Hx     Social History   Socioeconomic History   Marital status: Married    Spouse name: [name]   Number of children: 2   Years of education: Not on file   Highest education level: Bachelor's degree (e.g., BA, AB, BS)  Occupational History   Occupation: retired    Fish farm manager: [employer]  Tobacco Use   Smoking status: Never   Smokeless tobacco: Never  Vaping Use   Vaping Use: Never used  Substance and Sexual Activity   Alcohol use: Not Currently   Drug use: No   Sexual activity: Not Currently    Partners: Male    Birth control/protection: Post-menopausal  Other Topics Concern   Not on file  Social History Narrative   hhof 2    2 Children at college and  beyond      Nichole Smith    Married    Retired age 48 2015   Recently moved taking care of grandchildren during the week.   Social Determinants of Health   Financial Resource Strain: Low Risk  (06/05/2022)   Overall Financial Resource Strain (CARDIA)    Difficulty of Paying Living Expenses: Not hard at all  Food Insecurity: No Food Insecurity (06/05/2022)   Hunger Vital Sign    Worried About Running Out of Food in the Last Year: Never true    Ran Out of Food in the Last Year: Never true  Transportation Needs: No Transportation Needs (06/05/2022)   PRAPARE - Hydrologist (Medical): No    Lack of Transportation (Non-Medical): No  Physical Activity: Sufficiently Active (06/05/2022)   Exercise Vital Sign    Days of Exercise per Week: 5 days    Minutes of Exercise per Session: 30 min  Stress: No Stress Concern Present (06/05/2022)   [place]    Feeling of Stress : Only a little  Social Connections: Socially Integrated (06/05/2022)   Social  Connection and Isolation Panel [NHANES]    Frequency of Communication with Friends and Family: Twice a week    Frequency of Social Gatherings with Friends and Family: Once a week    Attends  Religious Services: More than 4 times per year    Active Member of Clubs or Organizations: Yes    Attends Archivist Meetings: More than 4 times per year    Marital Status: Married    Outpatient Medications Prior to Visit  Medication Sig Dispense Refill   aspirin EC 81 MG tablet Take 81 mg by mouth at bedtime.     atorvastatin (LIPITOR) 40 MG tablet Take 1 tablet (40 mg total) by mouth daily. 90 tablet 3   CALCIUM PO Take 1 tablet by mouth at bedtime.      chlorthalidone (HYGROTON) 25 MG tablet Take 1 tablet (25 mg total) by mouth daily. 90 tablet 3   denosumab (PROLIA) 60 MG/ML SOSY injection Inject 60 mg into the skin every 6 (six) months.     diltiazem (CARDIZEM CD) 180 MG 24 hr capsule Take 1 capsule (180 mg total) by mouth daily. 90 capsule 3   diltiazem (CARDIZEM) 30 MG tablet 1 tablet every 6 hours for palpitations 60 tablet 3   Dulaglutide (TRULICITY) 4.5 KD/9.8PJ SOPN Inject 4.5 mg as directed once a week. 6 mL 0   estradiol (ESTRACE VAGINAL) 0.1 MG/GM vaginal cream INSERT 1 GRAM VAGINALLY TWICE WEEKLY 42.5 g 3   gabapentin (NEURONTIN) 300 MG capsule TAKE 1 CAPSULE AT BEDTIME 90 capsule 3   GEMTESA 75 MG TABS Take 1 tablet by mouth daily as needed.     HYDROcodone bit-homatropine (HYCODAN) 5-1.5 MG/5ML syrup Take 5 mLs by mouth every 8 (eight) hours as needed for cough. At night 120 mL 0   metoprolol succinate (TOPROL-XL) 50 MG 24 hr tablet Take 1 tablet (50 mg total) by mouth daily. 90 tablet 3   Multiple Vitamins-Minerals (PRESERVISION AREDS 2 PO) Take 1 tablet by mouth 2 (two) times daily.     naproxen sodium (ALEVE) 220 MG tablet Take 220 mg by mouth 2 (two) times daily as needed (for pain.).     nitrofurantoin, macrocrystal-monohydrate, (MACROBID) 100 MG capsule Take 1 capsule  (100 mg total) by mouth daily. 90 capsule 3   sulfamethoxazole-trimethoprim (BACTRIM DS) 800-160 MG tablet Take 1 tablet by mouth 2 (two) times daily. 10 tablet 1   SYNTHROID 137 MCG tablet TAKE 1 TABLET DAILY (NEED TO SCHEDULE AN APPOINTMENT FOR MORE REFILLS) 90 tablet 0   esomeprazole (NEXIUM) 40 MG capsule Take 40 mg by mouth daily at 12 noon.     meloxicam (MOBIC) 15 MG tablet Take 1 tablet (15 mg total) by mouth daily. 30 tablet 0   No facility-administered medications prior to visit.     EXAM:  BP 122/68 (BP Location: Left Arm, Patient Position: Sitting, Cuff Size: Large)   Pulse 67   Temp 97.8 F (36.6 C) (Oral)   Ht 4\' 11"  (1.499 m)   Wt 209 lb (94.8 kg)   LMP 02/12/2011   SpO2 97%   BMI 42.21 kg/m   Body mass index is 42.21 kg/m. Wt Readings from Last 3 Encounters:  10/14/22 209 lb (94.8 kg)  10/14/22 209 lb (94.8 kg)  10/07/22 215 lb (97.5 kg)    Physical Exam: Vital signs reviewed ASN:KNLZ is a well-developed well-nourished alert cooperative    who appearsr stated age in no acute distress.  HEENT: normocephalic atraumatic , Eyes: PERRL EOM's full, conjunctiva clear, Nares: paten,t no deformity discharge or tenderness., Ears: no deformity EAC's clear TMs with normal landmarks. Mouth: clear OP, no lesions, edema.  Moist mucous membranes. Dentition in adequate repair. NECK: supple  without masses, thyromegaly or bruits. CHEST/PULM:  Clear to auscultation and percussion breath sounds equal no wheeze , rales or rhonchi. No chest wall deformities or tenderness. Breast: normal by inspection . No dimpling, discharge, masses, tenderness or discharge . CV: PMI is nondisplaced, S1 S2 no gallops, murmurs, rubs. Peripheral pulses are full without delay. ABDOMEN: Bowel sounds normal nontender  No guard or rebound, no hepato splenomegal no CVA tenderness. . Extremtities:  No clubbing cyanosis or edema, no acute joint swelling or redness no focal atrophy NEURO:  Oriented x3,  cranial nerves 3-12 appear to be intact, no obvious focal weakness,gait within normal limits no abnormal reflexes or asymmetrical SKIN: No acute rashes normal turgor, color, no bruising or petechiae. PSYCH: Oriented, good eye contact, no obvious depression anxiety, cognition and judgment appear normal. LN: no cervical axillary  adenopathy  Lab Results  Component Value Date   WBC 10.1 09/24/2022   HGB 15.4 (H) 09/24/2022   HCT 45.7 09/24/2022   PLT 237.0 09/24/2022   GLUCOSE 76 05/13/2022   CHOL 119 08/18/2022   TRIG 46.0 08/18/2022   HDL 52.70 08/18/2022   LDLCALC 57 08/18/2022   ALT 32 08/18/2022   AST 18 08/18/2022   NA 142 05/13/2022   K 3.7 05/13/2022   CL 99 05/13/2022   CREATININE 0.62 05/13/2022   BUN 21 05/13/2022   CO2 24 05/13/2022   TSH 2.70 08/18/2022   INR 0.96 06/11/2011   HGBA1C 5.7 08/18/2022    BP Readings from Last 3 Encounters:  10/14/22 122/68  10/14/22 122/76  10/07/22 124/86    Lab results I record  reviewed with patient   ASSESSMENT AND PLAN:  Discussed the following assessment and plan:    ICD-10-CM   1. Other specified hypothyroidism  E03.8     2. Medication management  Z79.899     3. Elevated hemoglobin (HCC)  D58.2    felt from osa but no change under rx    4. 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 Vitamin D deficiency  E55.9     6. Prediabetes  R73.03     7. Coronary artery calcification  I25.10    I25.84     8. Essential hypertension  I10     9. Obstructive sleep apnea  G47.33     Continue thyroid medicine discussed other medicines options Discussed newer  weight loss medicine she is on Trulicity but may benefit from [facility] or other similar she is continuing lifestyle. Reviewed  parameters  Utd on monitoring c xray , cough resolved  Revewi and counsel and time 35 minutes Return in about 1 year (around 10/15/2023) for and when needed. .  Patient Care Team: Milledge Gerding, Standley Brooking, MD  as PCP - General Sueanne Margarita, MD as PCP - Cardiology (Cardiology) Thompson Grayer, MD as PCP - Electrophysiology (Cardiology) Jari Pigg, MD (Dermatology) Megan Salon, MD as Attending Physician (Gynecology) Earlie Server, MD as Attending Physician (Orthopedic Surgery) Patient Instructions  Good to see you today  Glad   your cough is better .  I agree with  wegovy trial as discussed  Have pharmacy send request for refills  of thyroid med when  needed   RSV vaccine may be a good idea but get it soon at [place]. Eilam Shrewsbury M.D.

## 2022-10-14 NOTE — Patient Instructions (Addendum)
Good to see you today  Glad   your cough is better .  I agree with  wegovy trial as discussed  Have pharmacy send request for refills  of thyroid med when  needed   RSV vaccine may be a good idea but get it soon at pharmacy

## 2022-10-20 ENCOUNTER — Encounter: Payer: Self-pay | Admitting: Cardiology

## 2022-10-20 ENCOUNTER — Ambulatory Visit: Payer: Medicare Other | Attending: Cardiology

## 2022-10-20 ENCOUNTER — Ambulatory Visit (INDEPENDENT_AMBULATORY_CARE_PROVIDER_SITE_OTHER): Payer: Medicare Other | Admitting: Cardiology

## 2022-10-20 VITALS — BP 114/62 | HR 76 | Ht 59.0 in | Wt 216.0 lb

## 2022-10-20 DIAGNOSIS — I480 Paroxysmal atrial fibrillation: Secondary | ICD-10-CM

## 2022-10-20 DIAGNOSIS — I2584 Coronary atherosclerosis due to calcified coronary lesion: Secondary | ICD-10-CM

## 2022-10-20 DIAGNOSIS — E785 Hyperlipidemia, unspecified: Secondary | ICD-10-CM | POA: Diagnosis not present

## 2022-10-20 DIAGNOSIS — I2510 Atherosclerotic heart disease of native coronary artery without angina pectoris: Secondary | ICD-10-CM | POA: Insufficient documentation

## 2022-10-20 DIAGNOSIS — I10 Essential (primary) hypertension: Secondary | ICD-10-CM | POA: Diagnosis not present

## 2022-10-20 MED ORDER — ATORVASTATIN CALCIUM 40 MG PO TABS: 40.0000 mg | ORAL_TABLET | Freq: Every day | ORAL | 3 refills | Status: DC

## 2022-10-20 MED ORDER — CHLORTHALIDONE 25 MG PO TABS: 25.0000 mg | ORAL_TABLET | Freq: Every day | ORAL | 3 refills | Status: DC

## 2022-10-20 MED ORDER — METOPROLOL SUCCINATE ER 50 MG PO TB24: 50.0000 mg | ORAL_TABLET | Freq: Every day | ORAL | 3 refills | Status: DC

## 2022-10-20 MED ORDER — DILTIAZEM HCL ER COATED BEADS 180 MG PO CP24: 180.0000 mg | ORAL_CAPSULE | Freq: Every day | ORAL | 3 refills | Status: DC

## 2022-10-20 NOTE — Progress Notes (Signed)
Date:  10/20/2022   ID:  Nichole, Smith [birth year], MRN [id number]   PCP:  Burnis Medin, MD  Cardiologist:  Fransico Him, MD  Electrophysiologist:  Thompson Grayer, MD   Chief Complaint:  Coronary calcifications, HTN, HLD, DM  History of Present Illness:    Nichole Smith is a 66 y.o. 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 Cyndia Bent, HTN, OSA on CPAP followed by Dr. Halford Chessman and coronary artery calcifications on chest CT as well as calcifications of the aortic arch and carotid bifurcation.  She had a nuclear stress test 08/2018 that was low risk with no ischemia.  She underwent coronary Ca score and was very elevated at 1272.  She is here today for followup and is doing well.  She had a broken foot and was sedentary for 2 months and so has noticed some DOE when going up inclines but prior to that had no SOB.  She denies any chest pain or pressure, PND, orthopnea, LE edema, dizziness or syncope. She has had some breakthrough of afib at times but then will go 6 months before another episode. She is compliant with her meds and is tolerating meds with no SE.    Prior CV studies:   The following studies were reviewed today:  none  Past Medical History:  Diagnosis Date   Arthritis    knees, back   Bursitis    hips - almost gone per pt    Cancer ([place])    Carcinoid tumor of lung 2012   right found incidentally on chest xray eval for r/o vasxculitis    Carpal tunnel syndrome, bilateral    bilat but surgery to correct    Coronary artery calcification 07/23/2016   Double vision    history, no current problem   Dysrhythmia    irregular occassional, no current problem   Frequent UTI    HNP (herniated nucleus pulposus), cervical 01/22/2011   C6-C7  Has seen dr Vertell Limber for this and Dr Tonita Cong  Positional numbness in hands no weakness     Hypertension    Hypothyroidism    Infertility, female    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  Download 08/2014:  Good compliance, breakthru apnea on 10cm.  Changed to auto 5-12cm >> good control of AHI on f/u download ONO on CPAP:       Osteoarthritis    PCOS (polycystic ovarian syndrome)    Sleep apnea    wears cpap    SUI (stress urinary incontinence, female)    h/o   Past Surgical History:  Procedure Laterality Date   ANTERIOR CERVICAL DECOMP/DISCECTOMY FUSION  12/24/2017   Procedure: Cervical five-six Cervical six-seven Anterior cervical decompression/discectomy/fusion;  Surgeon: Erline Levine, MD;  Location: [place];  Service: Neurosurgery;;   CARPAL TUNNEL RELEASE Left 12/24/2017   Procedure: LEFT CARPAL TUNNEL RELEASE;  Surgeon: Erline Levine, MD;  Location: [place];  Service: Neurosurgery;  Laterality: Left;   CESAREAN SECTION     x 1 - twins   COLONOSCOPY     DILATATION & CURRETTAGE/HYSTEROSCOPY WITH RESECTOCOPE N/A 07/18/2013   Procedure: [place];  Surgeon: Lyman Speller, MD;  Location: [facility];  Service: Gynecology;  Laterality: N/A;  mass resection   implantable loop recorder placement  05/20/2021   Medtronic Reveal Linq model [id number] implantable loop  recorder (SN [id number])   Lung tumor removed     Rt lung carcinoid   MOUTH SURGERY     pre-cancerous ulcer removed   POLYPECTOMY     SHOULDER OPEN ROTATOR CUFF REPAIR  07/20/2010   Right   WISDOM TOOTH EXTRACTION       Current Meds  Medication Sig   aspirin EC 81 MG tablet Take 81 mg by mouth at bedtime.   CALCIUM PO Take 1 tablet by mouth at bedtime.    denosumab (PROLIA) 60 MG/ML SOSY injection Inject 60 mg into the skin every 6 (six) months.   diltiazem (CARDIZEM) 30 MG tablet 1 tablet every 6 hours for palpitations   Dulaglutide (TRULICITY) 4.5 XT/0.6YI SOPN Inject 4.5 mg as directed once a week.   estradiol (ESTRACE VAGINAL) 0.1 MG/GM vaginal cream INSERT 1 GRAM VAGINALLY TWICE  WEEKLY   gabapentin (NEURONTIN) 300 MG capsule TAKE 1 CAPSULE AT BEDTIME   GEMTESA 75 MG TABS Take 1 tablet by mouth daily as needed.   Multiple Vitamins-Minerals (PRESERVISION AREDS 2 PO) Take 1 tablet by mouth 2 (two) times daily.   nitrofurantoin, macrocrystal-monohydrate, (MACROBID) 100 MG capsule Take 1 capsule (100 mg total) by mouth daily.   sulfamethoxazole-trimethoprim (BACTRIM DS) 800-160 MG tablet Take 1 tablet by mouth 2 (two) times daily.   SYNTHROID 137 MCG tablet TAKE 1 TABLET DAILY (NEED TO SCHEDULE AN APPOINTMENT FOR MORE REFILLS)   [DISCONTINUED] atorvastatin (LIPITOR) 40 MG tablet Take 1 tablet (40 mg total) by mouth daily.   [DISCONTINUED] chlorthalidone (HYGROTON) 25 MG tablet Take 1 tablet (25 mg total) by mouth daily.   [DISCONTINUED] diltiazem (CARDIZEM CD) 180 MG 24 hr capsule Take 1 capsule (180 mg total) by mouth daily.   [DISCONTINUED] metoprolol succinate (TOPROL-XL) 50 MG 24 hr tablet Take 1 tablet (50 mg total) by mouth daily.     Allergies:   Neomycin and Ace inhibitors   Social History   Tobacco Use   Smoking status: Never   Smokeless tobacco: Never  Vaping Use   Vaping Use: Never used  Substance Use Topics   Alcohol use: Not Currently   Drug use: No     Family Hx: The patient's family history includes Alcohol abuse in her father; Alcoholism in her mother; Coronary artery disease in her father; Diabetes in her brother; Hypertension in her brother, father, maternal uncle, and mother; Liver disease in her father; Obesity in her father; Parkinson's disease in her brother; Stroke in her mother; Sudden death in her mother. There is no history of Colon cancer, Rectal cancer, Stomach cancer, Colon polyps, or Esophageal cancer.  ROS:   Please see the history of present illness.     All other systems reviewed and are negative.   Labs/Other Tests and Data Reviewed:    Recent Labs: 05/13/2022: BUN 21; Creatinine, Ser 0.62; Potassium 3.7; Sodium  142 08/18/2022: ALT 32; TSH 2.70 09/24/2022: Hemoglobin 15.4; Platelets 237.0   Recent Lipid Panel Lab Results  Component Value Date/Time   CHOL 119 08/18/2022 08:04 AM   CHOL 148 10/03/2021 08:06 AM   CHOL 110 11/03/2016 12:00 AM   TRIG 46.0 08/18/2022 08:04 AM   TRIG 54 11/03/2016 12:00 AM   HDL 52.70 08/18/2022 08:04 AM   HDL 58 10/03/2021 08:06 AM   HDL 48 (L) 11/03/2016 12:00 AM   CHOLHDL 2 08/18/2022 08:04 AM   LDLCALC 57 08/18/2022 08:04 AM   LDLCALC 76 10/03/2021 08:06 AM   LDLCALC 49 11/03/2016 12:00 AM  EKG was not performed   Objective:    Vital Signs:  BP 114/62   Pulse 76   Ht 4\' 11"  (1.499 m)   Wt 216 lb (98 kg)   LMP 02/12/2011   SpO2 99%   BMI 43.63 kg/m    GEN: Well nourished, well developed in no acute distress HEENT: Normal NECK: No JVD; No carotid bruits LYMPHATICS: No lymphadenopathy CARDIAC:RRR, no murmurs, rubs, gallops RESPIRATORY:  Clear to auscultation without rales, wheezing or rhonchi  ABDOMEN: Soft, non-tender, non-distended MUSCULOSKELETAL:  No edema; No deformity  SKIN: Warm and dry NEUROLOGIC:  Alert and oriented x 3 PSYCHIATRIC:  Normal affect   ASSESSMENT & PLAN:    1.  Coronary artery calcifications/Aortic atherosclerosis -nuclear stress test showed no ischemia 01/2020 -coronary calcium score very high at 1272 -She has not had any anginal symptoms since I saw her last -Continue prescription management with atorvastatin 40 mg daily and aspirin 81 mg daily with as needed refills   2.  HTN -BP is adequately controlled on exam today -Continue prescription drug management Cardizem CD 180 mg daily, chlorthalidone 25 mg daily Toprol-XL 50 mg daily with as needed refills -labs were personally reviewed from [facility] showing a serum creatinine 0.62 and potassium 3.7 on 05/13/2022  3.  HLD -LDL goal < 70 -I have personally reviewed and interpreted outside labs performed by patient's PCP which showed LDL 57 and HDL 52 on 08/18/2022   -Continue prescription drug management with atorvastatin 40 mg daily with as needed refills     4.  PAF -This was diagnosed on a cardio mobile device -her CHADS2VASC 2 is 34 (female, HTN, age >18) but decided not to pursue anticoagulation due to only 1 episode of A-fib in 2022.  She is followed by Dr. Curt Bears with EP -she had a loop recorder placed to determine afib burden>>she thinks she had afib on 10/13 lasting over 3 hours -ILR interrogated today showing 2 hours and 30 min of afib on 10/14.   -discussed with Dr. Curt Bears and he feels ok at this time to hold off on anticoagulation.   -cannot use Flecainide due to significant coronary Ca burden -continue short acting Cardizem if she has any further breakthrough.  I have told her to call Dr. Curt Bears with any further breakthrough of afib  Medication Adjustments/Labs and Tests Ordered: Current medicines are reviewed at length with the patient today.  Concerns regarding medicines are outlined above.  Tests Ordered: No orders of the defined types were placed in this encounter.  Medication Changes: Meds ordered this encounter  Medications   diltiazem (CARDIZEM CD) 180 MG 24 hr capsule    Sig: Take 1 capsule (180 mg total) by mouth daily.    Dispense:  90 capsule    Refill:  3   atorvastatin (LIPITOR) 40 MG tablet    Sig: Take 1 tablet (40 mg total) by mouth daily.    Dispense:  90 tablet    Refill:  3   chlorthalidone (HYGROTON) 25 MG tablet    Sig: Take 1 tablet (25 mg total) by mouth daily.    Dispense:  90 tablet    Refill:  3   metoprolol succinate (TOPROL-XL) 50 MG 24 hr tablet    Sig: Take 1 tablet (50 mg total) by mouth daily.    Dispense:  90 tablet    Refill:  3    Disposition:  Follow up in 1 year(s)  Signed, Fransico Him, MD  10/20/2022 10:07 AM  [facility]

## 2022-10-20 NOTE — Patient Instructions (Signed)
Medication Instructions:  Your physician recommends that you continue on your current medications as directed. Please refer to the Current Medication list given to you today. ' *If you need a refill on your cardiac medications before your next appointment, please call your pharmacy*   Follow-Up: At [facility], you and your health needs are our priority.  As part of our continuing mission to provide you with exceptional heart care, we have created designated Provider Care Teams.  These Care Teams include your primary Cardiologist (physician) and Advanced Practice Providers (APPs -  Physician Assistants and Nurse Practitioners) who all work together to provide you with the care you need, when you need it.  We recommend signing up for the patient portal called "MyChart".  Sign up information is provided on this After Visit Summary.  MyChart is used to connect with patients for Virtual Visits (Telemedicine).  Patients are able to view lab/test results, encounter notes, upcoming appointments, etc.  Non-urgent messages can be sent to your provider as well.   To learn more about what you can do with MyChart, go to [email or web address].    Your next appointment:   1 year(s)  The format for your next appointment:   In Person  Provider:   Fransico Him, MD     Important Information About Sugar

## 2022-10-21 ENCOUNTER — Ambulatory Visit: Payer: Medicare Other | Admitting: Family Medicine

## 2022-10-21 LAB — CUP PACEART REMOTE DEVICE CHECK
Date Time Interrogation Session: 20231119231208
Implantable Pulse Generator Implant Date: 20220620

## 2022-10-27 NOTE — Progress Notes (Signed)
Chief Complaint:   OBESITY Nichole Smith is here to discuss her progress with her obesity treatment plan along with follow-up of her obesity related diagnoses. Nichole Smith is on keeping a food journal and adhering to recommended goals of 1500 calories and 90 grams of protein daily and states she is following her eating plan approximately 75% of the time. Nichole Smith states she is doing various exercise for 100 minutes per week.  Today's visit was #: 8 Starting weight: 209 lbs Starting date: 01/12/2019 Today's weight: 209 lbs  Today's date: 10/14/2022 Total lbs lost to date: 29 Total lbs lost since last in-office visit: 0  Interim History: Nichole Smith has maintained weight loss. She is averaging 1500 calories over the past 4 weeks. We discussed trying  to meet her protein goals to avoid dropping her metabolism. She is considering starting  [facility] and she would start at 1.7 mg if she wants to start at her next visit.   Subjective:   1. SOBOE (shortness of breath on exertion) Nichole Smith's repeat IC today was 1600, better than previous but it is still lower than expected.   2. Prediabetes Nichole Smith is taking Trulicity 4.5 mg weekly with no side effects noted. Her last A1c was 5.7 and insulin 22.2.  Assessment/Plan:   1. SOBOE (shortness of breath on exertion) Keeshia continues to work on her healthy eating plan and exercise.   2. Prediabetes Nichole Smith will continue Trulicity, diet, and exercise.   3. Obesity, Current BMI 42.3 Nichole Smith is currently in the action stage of change. As such, her goal is to continue with weight loss efforts. She has agreed to keeping a food journal and adhering to recommended goals of 1500 calories and 90+ grams of protein daily.   Exercise goals: As is.   Behavioral modification strategies: increasing lean protein intake and keeping a strict food journal.  Nichole Smith has agreed to follow-up with our clinic in 4 weeks. She was informed of the importance of frequent follow-up visits to maximize  her success with intensive lifestyle modifications for her multiple health conditions.   Objective:   Blood pressure 122/76, pulse 61, temperature 98.2 F (36.8 C), height 4\' 11"  (1.499 m), weight 209 lb (94.8 kg), last menstrual period 02/12/2011, SpO2 97 %. Body mass index is 42.21 kg/m.  General: Cooperative, alert, well developed, in no acute distress. HEENT: Conjunctivae and lids unremarkable. Cardiovascular: Regular rhythm.  Lungs: Normal work of breathing. Neurologic: No focal deficits.   Lab Results  Component Value Date   CREATININE 0.62 05/13/2022   BUN 21 05/13/2022   NA 142 05/13/2022   K 3.7 05/13/2022   CL 99 05/13/2022   CO2 24 05/13/2022   Lab Results  Component Value Date   ALT 32 08/18/2022   AST 18 08/18/2022   ALKPHOS 60 08/18/2022   BILITOT 0.7 08/18/2022   Lab Results  Component Value Date   HGBA1C 5.7 08/18/2022   HGBA1C 5.5 05/13/2022   HGBA1C 5.5 10/03/2021   HGBA1C 5.3 06/25/2021   HGBA1C 5.4 06/27/2020   Lab Results  Component Value Date   INSULIN 22.2 09/16/2022   INSULIN 12.2 05/13/2022   INSULIN 13.6 10/03/2021   INSULIN 12.2 06/25/2021   INSULIN 13.7 06/27/2020   Lab Results  Component Value Date   TSH 2.70 08/18/2022   Lab Results  Component Value Date   CHOL 119 08/18/2022   HDL 52.70 08/18/2022   LDLCALC 57 08/18/2022   TRIG 46.0 08/18/2022   CHOLHDL 2 08/18/2022  Lab Results  Component Value Date   VD25OH 52.5 09/16/2022   VD25OH 42.3 05/13/2022   VD25OH 62.5 10/03/2021   Lab Results  Component Value Date   WBC 10.1 09/24/2022   HGB 15.4 (H) 09/24/2022   HCT 45.7 09/24/2022   MCV 92.7 09/24/2022   PLT 237.0 09/24/2022   No results found for: "IRON", "TIBC", "FERRITIN"  Attestation Statements:   Reviewed by clinician on day of visit: allergies, medications, problem list, medical history, surgical history, family history, social history, and previous encounter notes.  Time spent on visit including  pre-visit chart review and post-visit care and charting was 40 minutes.   I, Trixie Dredge, am acting as transcriptionist for Dennard Nip, MD.  I have reviewed the above documentation for accuracy and completeness, and I agree with the above. -  Dennard Nip, MD

## 2022-11-03 NOTE — Progress Notes (Unsigned)
[facility] [place] Phone: [phone] Subjective:   [name], am serving as a scribe for Dr. Hulan Saas.  I'm seeing this patient by the request  of:  Panosh, Standley Brooking, MD  CC: Foot and ankle pain follow-up  GEX:[id number]  10/07/2022 Peroneal tendon tear noted.  Discussed with patient about icing regimen and home exercises, which activities to do and which ones to avoid.  We discussed heel lift, patient is going to get a lace up ankle brace that I think will be more beneficial.  Follow-up again in 4 to 6 weeks      Update 11/05/2022 ELLINA SIVERTSEN is a 66 y.o. female coming in with complaint of R foot pain. Patient states that she is doing well. Ten days ago she increased her heel lifts and the following 2 days she had to wear a splint.   L achilles pain increased after going up bleachers to watch her grandson play a game. Used brace and this helped her pain.        Past Medical History:  Diagnosis Date   Arthritis    knees, back   Bursitis    hips - almost gone per pt    Cancer ([place])    Carcinoid tumor of lung 2012   right found incidentally on chest xray eval for r/o vasxculitis    Carpal tunnel syndrome, bilateral    bilat but surgery to correct    Coronary artery calcification 07/23/2016   Double vision    history, no current problem   Dysrhythmia    irregular occassional, no current problem   Frequent UTI    HNP (herniated nucleus pulposus), cervical 01/22/2011   C6-C7  Has seen dr Vertell Limber for this and Dr Tonita Cong  Positional numbness in hands no weakness     Hypertension    Hypothyroidism    Infertility, female    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  Download 08/2014:  Good compliance, breakthru apnea on 10cm.  Changed to auto 5-12cm >> good control of AHI on f/u download ONO on CPAP:       Osteoarthritis     PCOS (polycystic ovarian syndrome)    Sleep apnea    wears cpap    SUI (stress urinary incontinence, female)    h/o   Past Surgical History:  Procedure Laterality Date   ANTERIOR CERVICAL DECOMP/DISCECTOMY FUSION  12/24/2017   Procedure: Cervical five-six Cervical six-seven Anterior cervical decompression/discectomy/fusion;  Surgeon: Erline Levine, MD;  Location: [facility];  Service: Neurosurgery;;   CARPAL TUNNEL RELEASE Left 12/24/2017   Procedure: LEFT CARPAL TUNNEL RELEASE;  Surgeon: Erline Levine, MD;  Location: [facility];  Service: Neurosurgery;  Laterality: Left;   CESAREAN SECTION     x 1 - twins   COLONOSCOPY     DILATATION & CURRETTAGE/HYSTEROSCOPY WITH RESECTOCOPE N/A 07/18/2013   Procedure: [place];  Surgeon: Lyman Speller, MD;  Location: [facility];  Service: Gynecology;  Laterality: N/A;  mass resection   implantable loop recorder placement  05/20/2021   Medtronic Reveal Linq model [id number] implantable loop recorder (SN [id number])   Lung tumor removed     Rt lung carcinoid   MOUTH SURGERY     pre-cancerous ulcer removed   POLYPECTOMY     SHOULDER OPEN ROTATOR CUFF REPAIR  07/20/2010   Right  WISDOM TOOTH EXTRACTION     Social History   Socioeconomic History   Marital status: Married    Spouse name: [name]   Number of children: 2   Years of education: Not on file   Highest education level: Bachelor's degree (e.g., BA, AB, BS)  Occupational History   Occupation: retired    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   Social Determinants of Health   Financial Resource Strain: Low Risk  (06/05/2022)   Overall Financial Resource Strain (CARDIA)    Difficulty of Paying Living Expenses: Not hard at all  Food Insecurity: No Food Insecurity (06/05/2022)   Hunger Vital Sign    Worried About Running Out of Food in the Last Year: Never true    Ran Out of Food in the Last Year: Never true  Transportation Needs: No Transportation Needs (06/05/2022)   PRAPARE - Hydrologist (Medical): No    Lack of Transportation (Non-Medical): No  Physical Activity: Sufficiently Active (06/05/2022)   Exercise Vital Sign    Days of Exercise per Week: 5 days    Minutes of Exercise per Session: 30 min  Stress: No Stress Concern Present (06/05/2022)   [address]    Feeling of Stress : Only a little  Social Connections: Socially Integrated (06/05/2022)   Social Connection and Isolation Panel [NHANES]    Frequency of Communication with Friends and Family: Twice a week    Frequency of Social Gatherings with Friends and Family: Once a week    Attends Religious Services: More than 4 times per year    Active Member of [employer] or Organizations: Yes    Attends Music therapist: More than 4 times per year    Marital Status: Married   Allergies  Allergen Reactions   Neomycin Other (See Comments)    Inflammation--eye drop    Ace Inhibitors Cough   Family History  Problem Relation Age of Onset   Stroke Mother        died age 35   Hypertension Mother    Sudden death Mother    Alcoholism Mother    Coronary artery disease Father        died age 75   Hypertension Father    Liver disease Father    Alcohol abuse Father    Obesity Father    Parkinson's disease Brother    Diabetes Brother    Hypertension Brother    Hypertension Maternal  Uncle    Colon cancer Neg Hx    Rectal cancer Neg Hx    Stomach cancer Neg Hx    Colon polyps Neg Hx    Esophageal cancer Neg Hx     Current Outpatient Medications (Endocrine & Metabolic):    denosumab (PROLIA) 60 MG/ML SOSY injection, Inject 60 mg  into the skin every 6 (six) months.   Dulaglutide (TRULICITY) 4.5 AU/6.3FH SOPN, Inject 4.5 mg as directed once a week.   SYNTHROID 137 MCG tablet, TAKE 1 TABLET DAILY (NEED TO SCHEDULE AN APPOINTMENT FOR MORE REFILLS)  Current Outpatient Medications (Cardiovascular):    atorvastatin (LIPITOR) 40 MG tablet, Take 1 tablet (40 mg total) by mouth daily.   chlorthalidone (HYGROTON) 25 MG tablet, Take 1 tablet (25 mg total) by mouth daily.   diltiazem (CARDIZEM CD) 180 MG 24 hr capsule, Take 1 capsule (180 mg total) by mouth daily.   diltiazem (CARDIZEM) 30 MG tablet, 1 tablet every 6 hours for palpitations   metoprolol succinate (TOPROL-XL) 50 MG 24 hr tablet, Take 1 tablet (50 mg total) by mouth daily.   Current Outpatient Medications (Analgesics):    aspirin EC 81 MG tablet, Take 81 mg by mouth at bedtime.   Current Outpatient Medications (Other):    CALCIUM PO, Take 1 tablet by mouth at bedtime.    estradiol (ESTRACE VAGINAL) 0.1 MG/GM vaginal cream, INSERT 1 GRAM VAGINALLY TWICE WEEKLY   gabapentin (NEURONTIN) 300 MG capsule, TAKE 1 CAPSULE AT BEDTIME   GEMTESA 75 MG TABS, Take 1 tablet by mouth daily as needed.   Multiple Vitamins-Minerals (PRESERVISION AREDS 2 PO), Take 1 tablet by mouth 2 (two) times daily.   nitrofurantoin, macrocrystal-monohydrate, (MACROBID) 100 MG capsule, Take 1 capsule (100 mg total) by mouth daily.   sulfamethoxazole-trimethoprim (BACTRIM DS) 800-160 MG tablet, Take 1 tablet by mouth 2 (two) times daily.   Reviewed prior external information including notes and imaging from  primary care provider As well as notes that were available from care everywhere and other healthcare systems.  Past medical history,  social, surgical and family history all reviewed in electronic medical record.  No pertanent information unless stated regarding to the chief complaint.   Review of Systems:  No headache, visual changes, nausea, vomiting, diarrhea, constipation, dizziness, abdominal pain, skin rash, fevers, chills, night sweats, weight loss, swollen lymph nodes, body aches, joint swelling, chest pain, shortness of breath, mood changes. POSITIVE muscle aches  Objective  Blood pressure (!) 134/92, pulse 62, height 4\' 11"  (1.499 m), weight 213 lb (96.6 kg), last menstrual period 02/12/2011, SpO2 96 %.   General: No apparent distress alert and oriented x3 mood and affect normal, dressed appropriately.  HEENT: Pupils equal, extraocular movements intact  Respiratory: Patient's speak in full sentences and does not appear short of breath  Cardiovascular: No lower extremity edema, non tender, no erythema  Still having some antalgic gait noted.  Still some mild swelling on the lateral aspect of the ankle.  Patient does have some audible popping noted as well.  Limited muscular skeletal ultrasound was performed and interpreted by Hulan Saas, M Limited ultrasound of patient's right ankle still shows some hypoechoic changes within the tendon sheath noted.  This is of the peroneal tendon.  Still some mild increase in Doppler flow.  Patient does not have any more cortical irregularity at the moment. Impression: Interval improvement.    Impression and Recommendations:

## 2022-11-05 ENCOUNTER — Ambulatory Visit: Payer: Self-pay

## 2022-11-05 ENCOUNTER — Ambulatory Visit (INDEPENDENT_AMBULATORY_CARE_PROVIDER_SITE_OTHER): Payer: Medicare Other | Admitting: Family Medicine

## 2022-11-05 ENCOUNTER — Encounter: Payer: Self-pay | Admitting: Family Medicine

## 2022-11-05 VITALS — BP 134/92 | HR 62 | Ht 59.0 in | Wt 213.0 lb

## 2022-11-05 DIAGNOSIS — I2584 Coronary atherosclerosis due to calcified coronary lesion: Secondary | ICD-10-CM | POA: Diagnosis not present

## 2022-11-05 DIAGNOSIS — M79671 Pain in right foot: Secondary | ICD-10-CM

## 2022-11-05 DIAGNOSIS — S86311A Strain of muscle(s) and tendon(s) of peroneal muscle group at lower leg level, right leg, initial encounter: Secondary | ICD-10-CM | POA: Diagnosis not present

## 2022-11-05 DIAGNOSIS — I2510 Atherosclerotic heart disease of native coronary artery without angina pectoris: Secondary | ICD-10-CM

## 2022-11-05 NOTE — Patient Instructions (Signed)
Good to see you  Looks much better It is the tendon still giving you trouble Ice after long days  Follow up in 2 months if not better

## 2022-11-05 NOTE — Assessment & Plan Note (Signed)
Improvement noted but still has significant tendinitis noted.  We discussed with patient to continue the heel lift, home exercises, icing regimen, increase activity slowly.  Follow-up with me again 2 months to make sure it completely resolved.

## 2022-11-12 ENCOUNTER — Encounter (INDEPENDENT_AMBULATORY_CARE_PROVIDER_SITE_OTHER): Payer: Self-pay | Admitting: Family Medicine

## 2022-11-12 ENCOUNTER — Ambulatory Visit (INDEPENDENT_AMBULATORY_CARE_PROVIDER_SITE_OTHER): Payer: Medicare Other | Admitting: Family Medicine

## 2022-11-12 ENCOUNTER — Ambulatory Visit (INDEPENDENT_AMBULATORY_CARE_PROVIDER_SITE_OTHER): Payer: Medicare Other

## 2022-11-12 VITALS — BP 127/78 | HR 71 | Temp 98.2°F | Ht 59.0 in | Wt 213.0 lb

## 2022-11-12 VITALS — Ht 59.0 in | Wt 213.0 lb

## 2022-11-12 DIAGNOSIS — Z6841 Body Mass Index (BMI) 40.0 and over, adult: Secondary | ICD-10-CM

## 2022-11-12 DIAGNOSIS — Z0000 Encounter for general adult medical examination without abnormal findings: Secondary | ICD-10-CM

## 2022-11-12 DIAGNOSIS — R7303 Prediabetes: Secondary | ICD-10-CM | POA: Diagnosis not present

## 2022-11-12 DIAGNOSIS — E669 Obesity, unspecified: Secondary | ICD-10-CM

## 2022-11-12 MED ORDER — ZEPBOUND 10 MG/0.5ML ~~LOC~~ SOAJ: 10.0000 mg | SUBCUTANEOUS | 0 refills | Status: DC

## 2022-11-12 NOTE — Patient Instructions (Addendum)
Ms. Nichole Smith , Thank you for taking time to come for your Medicare Wellness Visit. I appreciate your ongoing commitment to your health goals. Please review the following plan we discussed and let me know if I can assist you in the future.   These are the goals we discussed:  Goals       Lose weight (pt-stated)      Patient Stated      I will continue to exercise 6 days per week for 30 minutes.       Weight (lb) < 200 lb (90.7 kg)        This is a list of the screening recommended for you and due dates:  Health Maintenance  Topic Date Due   COVID-19 Vaccine (6 - 2023-24 season) 11/28/2022*   Colon Cancer Screening  09/26/2023   Medicare Annual Wellness Visit  11/13/2023   DTaP/Tdap/Td vaccine (4 - Td or Tdap) 01/31/2024   Mammogram  08/26/2024   Pneumonia Vaccine  Completed   Flu Shot  Completed   DEXA scan (bone density measurement)  Completed   Hepatitis C Screening: USPSTF Recommendation to screen - Ages 70-79 yo.  Completed   Zoster (Shingles) Vaccine  Completed   HPV Vaccine  Aged Out  *Topic was postponed. The date shown is not the original due date.    Advanced directives: Please bring a copy of your health care power of attorney and living will to the office to be added to your chart at your convenience.   Conditions/risks identified: None  Next appointment: Follow up in one year for your annual wellness visit     Preventive Care 65 Years and Older, Female Preventive care refers to lifestyle choices and visits with your health care provider that can promote health and wellness. What does preventive care include? A yearly physical exam. This is also called an annual well check. Dental exams once or twice a year. Routine eye exams. Ask your health care provider how often you should have your eyes checked. Personal lifestyle choices, including: Daily care of your teeth and gums. Regular physical activity. Eating a healthy diet. Avoiding tobacco and drug  use. Limiting alcohol use. Practicing safe sex. Taking low-dose aspirin every day. Taking vitamin and mineral supplements as recommended by your health care provider. What happens during an annual well check? The services and screenings done by your health care provider during your annual well check will depend on your age, overall health, lifestyle risk factors, and family history of disease. Counseling  Your health care provider may ask you questions about your: Alcohol use. Tobacco use. Drug use. Emotional well-being. Home and relationship well-being. Sexual activity. Eating habits. History of falls. Memory and ability to understand (cognition). Work and work Statistician. Reproductive health. Screening  You may have the following tests or measurements: Height, weight, and BMI. Blood pressure. Lipid and cholesterol levels. These may be checked every 5 years, or more frequently if you are over 54 years old. Skin check. Lung cancer screening. You may have this screening every year starting at age 81 if you have a 30-pack-year history of smoking and currently smoke or have quit within the past 15 years. Fecal occult blood test (FOBT) of the stool. You may have this test every year starting at age 9. Flexible sigmoidoscopy or colonoscopy. You may have a sigmoidoscopy every 5 years or a colonoscopy every 10 years starting at age 66. Hepatitis C blood test. Hepatitis B blood test. Sexually transmitted disease (STD) testing.  Diabetes screening. This is done by checking your blood sugar (glucose) after you have not eaten for a while (fasting). You may have this done every 1-3 years. Bone density scan. This is done to screen for osteoporosis. You may have this done starting at age 24. Mammogram. This may be done every 1-2 years. Talk to your health care provider about how often you should have regular mammograms. Talk with your health care provider about your test results, treatment  options, and if necessary, the need for more tests. Vaccines  Your health care provider may recommend certain vaccines, such as: Influenza vaccine. This is recommended every year. Tetanus, diphtheria, and acellular pertussis (Tdap, Td) vaccine. You may need a Td booster every 10 years. Zoster vaccine. You may need this after age 79. Pneumococcal 13-valent conjugate (PCV13) vaccine. One dose is recommended after age 32. Pneumococcal polysaccharide (PPSV23) vaccine. One dose is recommended after age 25. Talk to your health care provider about which screenings and vaccines you need and how often you need them. This information is not intended to replace advice given to you by your health care provider. Make sure you discuss any questions you have with your health care provider. Document Released: 12/14/2015 Document Revised: 08/06/2016 Document Reviewed: 09/18/2015 Elsevier Interactive Patient Education  2017 [place] Prevention in the Home Falls can cause injuries. They can happen to people of all ages. There are many things you can do to make your home safe and to help prevent falls. What can I do on the outside of my home? Regularly fix the edges of walkways and driveways and fix any cracks. Remove anything that might make you trip as you walk through a door, such as a raised step or threshold. Trim any bushes or trees on the path to your home. Use bright outdoor lighting. Clear any walking paths of anything that might make someone trip, such as rocks or tools. Regularly check to see if handrails are loose or broken. Make sure that both sides of any steps have handrails. Any raised decks and porches should have guardrails on the edges. Have any leaves, snow, or ice cleared regularly. Use sand or salt on walking paths during winter. Clean up any spills in your garage right away. This includes oil or grease spills. What can I do in the bathroom? Use night lights. Install grab  bars by the toilet and in the tub and shower. Do not use towel bars as grab bars. Use non-skid mats or decals in the tub or shower. If you need to sit down in the shower, use a plastic, non-slip stool. Keep the floor dry. Clean up any water that spills on the floor as soon as it happens. Remove soap buildup in the tub or shower regularly. Attach bath mats securely with double-sided non-slip rug tape. Do not have throw rugs and other things on the floor that can make you trip. What can I do in the bedroom? Use night lights. Make sure that you have a light by your bed that is easy to reach. Do not use any sheets or blankets that are too big for your bed. They should not hang down onto the floor. Have a firm chair that has side arms. You can use this for support while you get dressed. Do not have throw rugs and other things on the floor that can make you trip. What can I do in the kitchen? Clean up any spills right away. Avoid walking on wet floors.  Keep items that you use a lot in easy-to-reach places. If you need to reach something above you, use a strong step stool that has a grab bar. Keep electrical cords out of the way. Do not use floor polish or wax that makes floors slippery. If you must use wax, use non-skid floor wax. Do not have throw rugs and other things on the floor that can make you trip. What can I do with my stairs? Do not leave any items on the stairs. Make sure that there are handrails on both sides of the stairs and use them. Fix handrails that are broken or loose. Make sure that handrails are as long as the stairways. Check any carpeting to make sure that it is firmly attached to the stairs. Fix any carpet that is loose or worn. Avoid having throw rugs at the top or bottom of the stairs. If you do have throw rugs, attach them to the floor with carpet tape. Make sure that you have a light switch at the top of the stairs and the bottom of the stairs. If you do not have them,  ask someone to add them for you. What else can I do to help prevent falls? Wear shoes that: Do not have high heels. Have rubber bottoms. Are comfortable and fit you well. Are closed at the toe. Do not wear sandals. If you use a stepladder: Make sure that it is fully opened. Do not climb a closed stepladder. Make sure that both sides of the stepladder are locked into place. Ask someone to hold it for you, if possible. Clearly mark and make sure that you can see: Any grab bars or handrails. First and last steps. Where the edge of each step is. Use tools that help you move around (mobility aids) if they are needed. These include: Canes. Walkers. Scooters. Crutches. Turn on the lights when you go into a dark area. Replace any light bulbs as soon as they burn out. Set up your furniture so you have a clear path. Avoid moving your furniture around. If any of your floors are uneven, fix them. If there are any pets around you, be aware of where they are. Review your medicines with your doctor. Some medicines can make you feel dizzy. This can increase your chance of falling. Ask your doctor what other things that you can do to help prevent falls. This information is not intended to replace advice given to you by your health care provider. Make sure you discuss any questions you have with your health care provider. Document Released: 09/13/2009 Document Revised: 04/24/2016 Document Reviewed: 12/22/2014 Elsevier Interactive Patient Education  2017 [employer].

## 2022-11-12 NOTE — Progress Notes (Signed)
Subjective:   Nichole Smith is a 66 y.o. female who presents for Medicare Annual (Subsequent) preventive examination.  Review of Systems    Virtual Visit via Telephone Note  I connected with  Nichole Smith on 11/12/22 at 11:00 AM EST by telephone and verified that I am speaking with the correct person using two identifiers.  Location: Patient: Home  Provider: Office Persons participating in the virtual visit: patient/Nurse Health Advisor   I discussed the limitations, risks, security and privacy concerns of performing an evaluation and management service by telephone and the availability of in person appointments. The patient expressed understanding and agreed to proceed.  Interactive audio and video telecommunications were attempted between this nurse and patient, however failed, due to patient having technical difficulties OR patient did not have access to video capability.  We continued and completed visit with audio only.  Some vital signs may be absent or patient reported.   Nichole Peaches, LPN  Cardiac Risk Factors include: advanced age (>35men, >75 women);hypertension     Objective:    Today's Vitals   11/12/22 1118  Weight: 213 lb (96.6 kg)  Height: 4\' 11"  (1.499 m)   Body mass index is 43.02 kg/m.     11/12/2022   11:24 AM 10/14/2021   10:09 AM 10/10/2021    8:05 AM 10/02/2020    9:59 AM 12/24/2017   11:27 AM 06/01/2016    8:38 PM 08/27/2015    2:49 PM  Advanced Directives  Does Patient Have a Medical Advance Directive? Yes Yes Yes Yes No Yes No;Yes  Type of Paramedic of [place];Living will [place];Living will [place];Living will [place];Living will  [place];Living will Living will;Healthcare Power of Attorney  Does patient want to make changes to medical advance directive?  No - Patient declined No - Patient declined No - Patient declined  No -  Patient declined   Copy of [place] in Chart? No - copy requested No - copy requested  No - copy requested  No - copy requested No - copy requested  Would patient like information on creating a medical advance directive?     No - Patient declined      Current Medications (verified) Outpatient Encounter Medications as of 11/12/2022  Medication Sig   aspirin EC 81 MG tablet Take 81 mg by mouth at bedtime.   atorvastatin (LIPITOR) 40 MG tablet Take 1 tablet (40 mg total) by mouth daily.   CALCIUM PO Take 1 tablet by mouth at bedtime.    chlorthalidone (HYGROTON) 25 MG tablet Take 1 tablet (25 mg total) by mouth daily.   denosumab (PROLIA) 60 MG/ML SOSY injection Inject 60 mg into the skin every 6 (six) months.   diltiazem (CARDIZEM CD) 180 MG 24 hr capsule Take 1 capsule (180 mg total) by mouth daily.   diltiazem (CARDIZEM) 30 MG tablet 1 tablet every 6 hours for palpitations   Dulaglutide (TRULICITY) 4.5 UX/3.2GM SOPN Inject 4.5 mg as directed once a week.   estradiol (ESTRACE VAGINAL) 0.1 MG/GM vaginal cream INSERT 1 GRAM VAGINALLY TWICE WEEKLY   gabapentin (NEURONTIN) 300 MG capsule TAKE 1 CAPSULE AT BEDTIME   GEMTESA 75 MG TABS Take 1 tablet by mouth daily as needed.   metoprolol succinate (TOPROL-XL) 50 MG 24 hr tablet Take 1 tablet (50 mg total) by mouth daily.   Multiple Vitamins-Minerals (PRESERVISION AREDS 2 PO) Take 1  tablet by mouth 2 (two) times daily.   nitrofurantoin, macrocrystal-monohydrate, (MACROBID) 100 MG capsule Take 1 capsule (100 mg total) by mouth daily.   sulfamethoxazole-trimethoprim (BACTRIM DS) 800-160 MG tablet Take 1 tablet by mouth 2 (two) times daily.   SYNTHROID 137 MCG tablet TAKE 1 TABLET DAILY (NEED TO SCHEDULE AN APPOINTMENT FOR MORE REFILLS)   No facility-administered encounter medications on file as of 11/12/2022.    Allergies (verified) Neomycin and Ace inhibitors   History: Past Medical History:  Diagnosis Date   Arthritis     knees, back   Bursitis    hips - almost gone per pt    Cancer ([place])    Carcinoid tumor of lung 2012   right found incidentally on chest xray eval for r/o vasxculitis    Carpal tunnel syndrome, bilateral    bilat but surgery to correct    Coronary artery calcification 07/23/2016   Double vision    history, no current problem   Dysrhythmia    irregular occassional, no current problem   Frequent UTI    HNP (herniated nucleus pulposus), cervical 01/22/2011   C6-C7  Has seen dr Vertell Limber for this and Dr Tonita Cong  Positional numbness in hands no weakness     Hypertension    Hypothyroidism    Infertility, female    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  Download 08/2014:  Good compliance, breakthru apnea on 10cm.  Changed to auto 5-12cm >> good control of AHI on f/u download ONO on CPAP:       Osteoarthritis    PCOS (polycystic ovarian syndrome)    Sleep apnea    wears cpap    SUI (stress urinary incontinence, female)    h/o   Past Surgical History:  Procedure Laterality Date   ANTERIOR CERVICAL DECOMP/DISCECTOMY FUSION  12/24/2017   Procedure: Cervical five-six Cervical six-seven Anterior cervical decompression/discectomy/fusion;  Surgeon: Erline Levine, MD;  Location: [facility];  Service: Neurosurgery;;   CARPAL TUNNEL RELEASE Left 12/24/2017   Procedure: LEFT CARPAL TUNNEL RELEASE;  Surgeon: Erline Levine, MD;  Location: [facility];  Service: Neurosurgery;  Laterality: Left;   CESAREAN SECTION     x 1 - twins   COLONOSCOPY     DILATATION & CURRETTAGE/HYSTEROSCOPY WITH RESECTOCOPE N/A 07/18/2013   Procedure: [place];  Surgeon: Lyman Speller, MD;  Location: [facility];  Service: Gynecology;  Laterality: N/A;  mass resection   implantable loop recorder placement  05/20/2021   Medtronic Reveal Linq model LNQ22 implantable loop recorder (SN [id number])   Lung tumor  removed     Rt lung carcinoid   MOUTH SURGERY     pre-cancerous ulcer removed   POLYPECTOMY     SHOULDER OPEN ROTATOR CUFF REPAIR  07/20/2010   Right   WISDOM TOOTH EXTRACTION     Family History  Problem Relation Age of Onset   Stroke Mother        died age 65   Hypertension Mother    Sudden death Mother    Alcoholism Mother    Coronary artery disease Father        died age 46   Hypertension Father    Liver disease Father    Alcohol abuse Father    Obesity Father    Parkinson's disease Brother    Diabetes Brother    Hypertension Brother    Hypertension Maternal Uncle  Colon cancer Neg Hx    Rectal cancer Neg Hx    Stomach cancer Neg Hx    Colon polyps Neg Hx    Esophageal cancer Neg Hx    Social History   Socioeconomic History   Marital status: Married    Spouse name: [name]   Number of children: 2   Years of education: Not on file   Highest education level: Bachelor's degree (e.g., BA, AB, BS)  Occupational History   Occupation: retired    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   Social Determinants of Health   Financial Resource Strain: Low Risk  (11/12/2022)   Overall Financial Resource Strain (CARDIA)    Difficulty of Paying Living Expenses: Not hard at all  Food Insecurity: No Food Insecurity (11/12/2022)   Hunger Vital Sign    Worried About Running Out of Food in the Last Year: Never true    Ran Out of Food in the Last Year: Never true   Transportation Needs: No Transportation Needs (11/12/2022)   PRAPARE - Hydrologist (Medical): No    Lack of Transportation (Non-Medical): No  Physical Activity: Insufficiently Active (11/12/2022)   Exercise Vital Sign    Days of Exercise per Week: 5 days    Minutes of Exercise per Session: 20 min  Stress: No Stress Concern Present (11/12/2022)   [place]    Feeling of Stress : Not at all  Social Connections: [place] (11/12/2022)   Social Connection and Isolation Panel [NHANES]    Frequency of Communication with Friends and Family: More than three times a week    Frequency of Social Gatherings with Friends and Family: More than three times a week    Attends Religious Services: More than 4 times per year    Active Member of [employer] or Organizations: Yes    Attends Music therapist: More than 4 times per year    Marital Status: Married    Tobacco Counseling Counseling given: Not Answered   Clinical Intake:  Pre-visit preparation completed: No  Pain : No/denies pain     BMI - recorded: 43.02 Nutritional Status: BMI > 30  Obese Nutritional Risks: None Diabetes: No  How often do you need to have someone help you when you read instructions, pamphlets, or other written materials from your doctor or pharmacy?: 1 - Never  Diabetic?  No  Interpreter Needed?: No  Information entered by :: Rolene Arbour LPN   Activities of Daily Living    11/12/2022   11:23 AM  In your present state of health, do you have any difficulty performing the following activities:  Hearing? 0  Vision? 0  Difficulty concentrating or making decisions? 0  Walking or climbing stairs? 0  Dressing or bathing? 0  Doing errands, shopping? 0  Preparing Food and eating ? N  Using the Toilet? N  In the past six months, have you accidently leaked urine? Y  Comment Wears breifs.  Followed by Urologist  Do you have problems with loss of bowel control? N  Managing your Medications? N  Managing your Finances? N  Housekeeping or managing your Housekeeping? N    Patient Care Team: Panosh, Standley Brooking, MD as PCP - General Sueanne Margarita, MD as PCP - Cardiology (Cardiology) Thompson Grayer, MD as PCP - Electrophysiology (Cardiology) Jari Pigg, MD (Dermatology) Megan Salon, MD as Attending Physician (Gynecology) Earlie Server, MD as Attending Physician (Orthopedic Surgery)  Indicate any recent Medical Services you may have received from other than [facility] providers in the past year (date may be approximate).     Assessment:   This is a routine wellness examination for Rayla.  Hearing/Vision screen Hearing Screening - Comments:: Denies hearing difficulties   Vision Screening - Comments:: Wears rx glasses - up to date with routine eye exams with  [place] issues and exercise activities discussed: Exercise limited by: None identified   Goals Addressed               This Visit's Progress     Lose weight (pt-stated)         Depression Screen    11/12/2022   11:22 AM 08/07/2022    8:30 AM 06/09/2022   11:31 AM 05/07/2022    9:43 AM 10/14/2021   10:01 AM 07/29/2021    9:39 AM 10/02/2020   10:02 AM  PHQ 2/9 Scores  PHQ - 2 Score 0 0 0 0 0 0 0  PHQ- 9 Score   0 3   0    Fall Risk    11/12/2022   11:24 AM 06/09/2022   11:31 AM 05/07/2022    9:43 AM 10/14/2021   10:05 AM 10/14/2021    9:53 AM  Fall Risk   Falls in the past year? 0 0 0 0 0  Number falls in past yr: 0 0 0 0   Injury with Fall? 0 0 0 1   Risk for fall due to : No Fall Risks No Fall Risks No Fall Risks    Follow up Falls prevention discussed Falls prevention discussed Falls evaluation completed      FALL RISK PREVENTION PERTAINING TO THE HOME:  Any stairs in or around the home? No  If so, are there any without handrails? No  Home free of loose throw rugs in walkways,  pet beds, electrical cords, etc? Yes  Adequate lighting in your home to reduce risk of falls? Yes   ASSISTIVE DEVICES UTILIZED TO PREVENT FALLS:  Life alert? No  Use of a cane, walker or w/c? No  Grab bars in the bathroom? No  Shower chair or bench in shower? Yes  Elevated toilet seat or a handicapped toilet? No   TIMED UP AND GO:  Was the test performed? No . Audio Visit  Cognitive Function:        11/12/2022   11:24 AM 10/14/2021   10:07 AM  6CIT Screen  What Year? 0 points 0 points  What month? 0 points 0 points  What time? 0 points 0 points  Count back from 20 0 points 0 points  Months in reverse 0 points 0 points  Repeat phrase 0 points 0 points  Total Score 0 points 0 points    Immunizations Immunization History  Administered Date(s)  Administered   Fluad Quad(high Dose 65+) 09/21/2019, 09/10/2020, 09/10/2022   Influenza Split 10/17/2011, 08/12/2012, 08/01/2013   Influenza, Quadrivalent, Recombinant, Inj, Pf 08/12/2018   Influenza,inj,Quad PF,6+ Mos 09/04/2014, 08/27/2015, 08/01/2016, 08/10/2017   Influenza-Unspecified 08/30/2018, 08/31/2021   Moderna SARS-COV2 Booster Vaccination 08/31/2021, 03/31/2022   Moderna Sars-Covid-2 Vaccination 01/27/2020, 09/22/2020, 12/15/2020, 03/12/2021, 09/10/2022   Pneumococcal Conjugate-13 10/05/2019   Pneumococcal Polysaccharide-23 09/10/2020   Td 12/01/1997, 10/16/2008   Tdap 01/30/2014   Zoster Recombinat (Shingrix) 01/12/2017, 04/02/2017   Zoster, Live 05/25/2012    TDAP status: Up to date  Flu Vaccine status: Up to date  Pneumococcal vaccine status: Up to date  Covid-19 vaccine status: Completed vaccines  Qualifies for Shingles Vaccine? Yes   Zostavax completed Yes   Shingrix Completed?: Yes  Screening Tests Health Maintenance  Topic Date Due   COVID-19 Vaccine (6 - 2023-24 season) 11/28/2022 (Originally 11/05/2022)   COLONOSCOPY (Pts 45-64yrs Insurance coverage will need to be confirmed)  09/26/2023    Medicare Annual Wellness (AWV)  11/13/2023   DTaP/Tdap/Td (4 - Td or Tdap) 01/31/2024   MAMMOGRAM  08/26/2024   Pneumonia Vaccine 61+ Years old  Completed   INFLUENZA VACCINE  Completed   DEXA SCAN  Completed   Hepatitis C Screening  Completed   Zoster Vaccines- Shingrix  Completed   HPV VACCINES  Aged Out    Health Maintenance  There are no preventive care reminders to display for this patient.   Colorectal cancer screening: Type of screening: Colonoscopy. Completed 09/25/20. Repeat every 3 years  Mammogram status: Completed 08/06/22. Repeat every year  Bone Density status: Completed 08/22/21. Results reflect: Bone density results: OSTEOPOROSIS. Repeat every   years.  Lung Cancer Screening: (Low Dose CT Chest recommended if Age 42-80 years, 30 pack-year currently smoking OR have quit w/in 15years.) does not qualify.     Additional Screening:  Hepatitis C Screening: does qualify; Completed 03/05/16  Vision Screening: Recommended annual ophthalmology exams for early detection of glaucoma and other disorders of the eye. Is the patient up to date with their annual eye exam?  Yes  Who is the provider or what is the name of the office in which the patient attends annual eye exams? [facility] If pt is not established with a provider, would they like to be referred to a provider to establish care? No .   Dental Screening: Recommended annual dental exams for proper oral hygiene  Community Resource Referral / Chronic Care Management:  CRR required this visit?  No   CCM required this visit?  No      Plan:     I have personally reviewed and noted the following in the patient's chart:   Medical and social history Use of alcohol, tobacco or illicit drugs  Current medications and supplements including opioid prescriptions. Patient is not currently taking opioid prescriptions. Functional ability and status Nutritional status Physical activity Advanced directives List of other  physicians Hospitalizations, surgeries, and ER visits in previous 12 months Vitals Screenings to include cognitive, depression, and falls Referrals and appointments  In addition, I have reviewed and discussed with patient certain preventive protocols, quality metrics, and best practice recommendations. A written personalized care plan for preventive services as well as general preventive health recommendations were provided to patient.     Nichole Peaches, LPN   70/35/0093   Nurse Notes: None

## 2022-11-13 ENCOUNTER — Other Ambulatory Visit: Payer: Self-pay | Admitting: Internal Medicine

## 2022-11-25 ENCOUNTER — Ambulatory Visit (INDEPENDENT_AMBULATORY_CARE_PROVIDER_SITE_OTHER): Payer: Medicare Other

## 2022-11-25 DIAGNOSIS — I480 Paroxysmal atrial fibrillation: Secondary | ICD-10-CM

## 2022-11-25 LAB — CUP PACEART REMOTE DEVICE CHECK
Date Time Interrogation Session: 20231225231114
Implantable Pulse Generator Implant Date: 20220620

## 2022-11-26 NOTE — Progress Notes (Signed)
Chief Complaint:   OBESITY Nichole Smith is here to discuss her progress with her obesity treatment plan along with follow-up of her obesity related diagnoses. Nichole Smith is on keeping a food journal and adhering to recommended goals of 1500 calories and 90+ grams of protein and states she is following her eating plan approximately 50% of the time. Nichole Smith states she is walking and swimming for 20 minutes 5 times per week.  Today's visit was #: 43 Starting weight: 209 lbs Starting date: 01/12/2019 Today's weight: 213 lbs Today's date: 11/12/2022 Total lbs lost to date: 0 Total lbs lost since last in-office visit: 0  Interim History: Nichole Smith has had extra challenges with weight gain over Thanksgiving. She has been looking into medications, and we spent significant time discussing medication options as well as side effects, cost, etc.   Subjective:   1. Prediabetes Nichole Smith is interested in changing her GLP-1 to Zepbound to help control her glucose and insulin levels, and decrease the risk of progressing to diabetes mellitus.   Assessment/Plan:   1. Prediabetes Nichole Smith's recent labs were reviewed and her diet was discussed. We will plan to recheck labs in 2 months.   2. Obesity, Current BMI 43.2 [name] is currently in the action stage of change. As such, her goal is to continue with weight loss efforts. She has agreed to keeping a food journal and adhering to recommended goals of 1500 calories and 90+ grams of protein daily.   We discussed various medication options to help Nichole Smith with her weight loss efforts and we both agreed to discontinue Trulicity, and start Zepbound 10 mg once weekly with no refills. (Patient to pay cash).  - Tirzepatide-Weight Management (ZEPBOUND) 10 MG/0.5ML SOAJ; Inject 10 mg into the skin once a week.  Dispense: 2 mL; Refill: 0  Exercise goals: As is.   Behavioral modification strategies: increasing lean protein intake.  Nichole Smith has agreed to follow-up with our clinic in 5  to 6 weeks with Mina Marble, NP. She was informed of the importance of frequent follow-up visits to maximize her success with intensive lifestyle modifications for her multiple health conditions.   Objective:   Blood pressure 127/78, pulse 71, temperature 98.2 F (36.8 C), height 4\' 11"  (1.499 m), weight 213 lb (96.6 kg), last menstrual period 02/12/2011, SpO2 96 %. Body mass index is 43.02 kg/m.  General: Cooperative, alert, well developed, in no acute distress. HEENT: Conjunctivae and lids unremarkable. Cardiovascular: Regular rhythm.  Lungs: Normal work of breathing. Neurologic: No focal deficits.   Lab Results  Component Value Date   CREATININE 0.62 05/13/2022   BUN 21 05/13/2022   NA 142 05/13/2022   K 3.7 05/13/2022   CL 99 05/13/2022   CO2 24 05/13/2022   Lab Results  Component Value Date   ALT 32 08/18/2022   AST 18 08/18/2022   ALKPHOS 60 08/18/2022   BILITOT 0.7 08/18/2022   Lab Results  Component Value Date   HGBA1C 5.7 08/18/2022   HGBA1C 5.5 05/13/2022   HGBA1C 5.5 10/03/2021   HGBA1C 5.3 06/25/2021   HGBA1C 5.4 06/27/2020   Lab Results  Component Value Date   INSULIN 22.2 09/16/2022   INSULIN 12.2 05/13/2022   INSULIN 13.6 10/03/2021   INSULIN 12.2 06/25/2021   INSULIN 13.7 06/27/2020   Lab Results  Component Value Date   TSH 2.70 08/18/2022   Lab Results  Component Value Date   CHOL 119 08/18/2022   HDL 52.70 08/18/2022   LDLCALC 57 08/18/2022  TRIG 46.0 08/18/2022   CHOLHDL 2 08/18/2022   Lab Results  Component Value Date   VD25OH 52.5 09/16/2022   VD25OH 42.3 05/13/2022   VD25OH 62.5 10/03/2021   Lab Results  Component Value Date   WBC 10.1 09/24/2022   HGB 15.4 (H) 09/24/2022   HCT 45.7 09/24/2022   MCV 92.7 09/24/2022   PLT 237.0 09/24/2022   No results found for: "IRON", "TIBC", "FERRITIN"  Attestation Statements:   Reviewed by clinician on day of visit: allergies, medications, problem list, medical history, surgical  history, family history, social history, and previous encounter notes.  Time spent on visit including pre-visit chart review and post-visit care and charting was 38 minutes.   I, Trixie Dredge, am acting as transcriptionist for Dennard Nip, MD.  I have reviewed the above documentation for accuracy and completeness, and I agree with the above. -  Dennard Nip, MD

## 2022-11-27 NOTE — Progress Notes (Signed)
[facility] Summary Report / Loop Recorder 

## 2022-12-02 ENCOUNTER — Telehealth: Payer: Self-pay

## 2022-12-02 NOTE — Telephone Encounter (Signed)
Per Dr. Cari Caraway to monitor.  MyChart message sent to Pt advising no medication changes at this time.

## 2022-12-02 NOTE — Telephone Encounter (Signed)
Alert received from [employer] [facility]:  Three new AF episodes, that correlated with a symptom episode. AF burden is 0.3% of the time.  The longest episdoe was 3 hours and 8 minutes.    Pt currently not on [place] d/t low afib burden.  Spoke with Pt.  She states this episode was like her previous afib episodes, her heart will race for a few hours and then it stops and she feels fine afterward.  She is getting ready to start [facility] (generic) for weight loss and would not like to start another medication at this time, but she will defer to Dr. Curt Bears.  Advised would send to him for review.

## 2022-12-15 ENCOUNTER — Ambulatory Visit: Payer: Self-pay

## 2022-12-15 ENCOUNTER — Ambulatory Visit (INDEPENDENT_AMBULATORY_CARE_PROVIDER_SITE_OTHER): Payer: Medicare Other | Admitting: Family Medicine

## 2022-12-15 VITALS — BP 132/78 | HR 78 | Ht 59.0 in | Wt 217.0 lb

## 2022-12-15 DIAGNOSIS — M25552 Pain in left hip: Secondary | ICD-10-CM | POA: Diagnosis not present

## 2022-12-15 DIAGNOSIS — G8929 Other chronic pain: Secondary | ICD-10-CM

## 2022-12-15 DIAGNOSIS — M7062 Trochanteric bursitis, left hip: Secondary | ICD-10-CM | POA: Diagnosis not present

## 2022-12-15 NOTE — Progress Notes (Unsigned)
   I, Philbert Riser, LAT, ATC acting as a scribe for Clementeen Graham, MD.  Nichole Smith is a 67 y.o. female who presents to [facility] today for left hip pain.  Patient was previously seen by Dr. Katrinka Blazing on 11/05/2022 for right foot pain.  Today, patient reports left hip pain x 4wks  Patient locates pain to the lateral aspect of the L hip and slightly into L buttock. Pt had a similar pain in her R hip in 2020.  Radiates: yes- slightly distally LE numbness/tingling: no LE weakness: no Aggravates: the first step after transitioning to stand, L-side lying, figure-4 Treatments tried: Tylenol, ice, heat, stop HEP  Dx imaging: 06/07/2021 pelvis & L-spine MRI  05/08/2021 pelvis x-ray  04/04/2021 L-spine x-ray   Pertinent review of systems: No fevers or chills  Relevant historical information: History contralateral trochanteric bursitis   Exam:  BP 132/78   Pulse 78   Ht 4\' 11"  (1.499 m)   Wt 217 lb (98.4 kg)   LMP 02/12/2011   SpO2 98%   BMI 43.83 kg/m  General: Well Developed, well nourished, and in no acute distress.   MSK: Left hip: Normal appearing Tender palpation greater trochanter. Hip abduction and external rotation strength are diminished 4/5. Normal hip rotation and range of motion. Antalgic gait.    Lab and Radiology Results  Procedure: Real-time Ultrasound Guided Injection of left lateral hip greater trochanter bursa Device: Philips Affiniti 50G Images permanently stored and available for review in PACS Verbal informed consent obtained.  Discussed risks and benefits of procedure. Warned about infection, bleeding, hyperglycemia damage to structures among others. Patient expresses understanding and agreement Time-out conducted.   Noted no overlying erythema, induration, or other signs of local infection.   Skin prepped in a sterile fashion.   Local anesthesia: Topical Ethyl chloride.   With sterile technique and under real time ultrasound  guidance: 40 mg of Kenalog and 2 mL of Marcaine injected into greater trochanter bursa. Fluid seen entering the bursa.   Completed without difficulty   Pain immediately resolved suggesting accurate placement of the medication.   Advised to call if fevers/chills, erythema, induration, drainage, or persistent bleeding.   Images permanently stored and available for review in the ultrasound unit.  Impression: Technically successful ultrasound guided injection.        Assessment and Plan: 67 y.o. female with left lateral hip pain due to trochanteric bursitis/hip abductor and rotator tendinitis.  Plan for injection today in refocus on home exercise program.  She has previously been taught these exercise program in the past but we also reviewed them again today.  If this is not sufficient I think physical therapy would be a great idea.  She will let me know and I will refer to physical therapy.   PDMP not reviewed this encounter. Orders Placed This Encounter  Procedures   73 LIMITED JOINT SPACE STRUCTURES LOW LEFT(NO LINKED CHARGES)    Order Specific Question:   Reason for Exam (SYMPTOM  OR DIAGNOSIS REQUIRED)    Answer:   Left hip pain    Order Specific Question:   Preferred imaging location?    Answer:   [facility] [place]   No orders of the defined types were placed in this encounter.    Discussed warning signs or symptoms. Please see discharge instructions. Patient expresses understanding.   The above documentation has been reviewed and is accurate and complete US, M.D.

## 2022-12-15 NOTE — Patient Instructions (Signed)
Thank you for coming in today.   Call or go to the ER if you develop a large red swollen joint with extreme pain or oozing puss.    Work on hip strength exercises.   Side leg raise., clamshell, reverse clamshell, giant steps.   If not improving enough let me know and I will order PT.

## 2022-12-17 ENCOUNTER — Ambulatory Visit (HOSPITAL_BASED_OUTPATIENT_CLINIC_OR_DEPARTMENT_OTHER): Payer: Medicare Other

## 2022-12-18 ENCOUNTER — Ambulatory Visit (INDEPENDENT_AMBULATORY_CARE_PROVIDER_SITE_OTHER): Payer: Medicare Other

## 2022-12-18 DIAGNOSIS — M810 Age-related osteoporosis without current pathological fracture: Secondary | ICD-10-CM | POA: Diagnosis not present

## 2022-12-18 MED ORDER — DENOSUMAB 60 MG/ML ~~LOC~~ SOSY
60.0000 mg | PREFILLED_SYRINGE | Freq: Once | SUBCUTANEOUS | Status: AC
  Administered 2022-12-18: 60 mg via SUBCUTANEOUS

## 2022-12-18 NOTE — Progress Notes (Signed)
Patient came in today to receive Prolia injection. Patient was given the injection and tolerated it well. tbw

## 2022-12-22 NOTE — Progress Notes (Signed)
Carelink Summary Report / Loop Recorder

## 2022-12-24 ENCOUNTER — Telehealth (HOSPITAL_BASED_OUTPATIENT_CLINIC_OR_DEPARTMENT_OTHER): Payer: Self-pay | Admitting: Obstetrics & Gynecology

## 2022-12-24 ENCOUNTER — Encounter (INDEPENDENT_AMBULATORY_CARE_PROVIDER_SITE_OTHER): Payer: Self-pay | Admitting: Adult Health

## 2022-12-24 ENCOUNTER — Ambulatory Visit (INDEPENDENT_AMBULATORY_CARE_PROVIDER_SITE_OTHER): Payer: Medicare Other | Admitting: Adult Health

## 2022-12-24 VITALS — BP 124/82 | HR 71 | Temp 98.0°F | Ht 59.0 in | Wt 208.0 lb

## 2022-12-24 DIAGNOSIS — E669 Obesity, unspecified: Secondary | ICD-10-CM

## 2022-12-24 DIAGNOSIS — Z6841 Body Mass Index (BMI) 40.0 and over, adult: Secondary | ICD-10-CM | POA: Diagnosis not present

## 2022-12-24 DIAGNOSIS — R0602 Shortness of breath: Secondary | ICD-10-CM | POA: Diagnosis not present

## 2022-12-24 DIAGNOSIS — R7303 Prediabetes: Secondary | ICD-10-CM

## 2022-12-24 MED ORDER — ZEPBOUND 10 MG/0.5ML ~~LOC~~ SOAJ: 10.0000 mg | SUBCUTANEOUS | 0 refills | Status: DC

## 2022-12-24 NOTE — Telephone Encounter (Signed)
Patient  called and would like to come in to be seen for spotting .

## 2022-12-25 NOTE — Telephone Encounter (Signed)
Spoke with pt who reports an episode of spotting yesterday. It has not happened again. Pt provided with appt for evaluation of postmenopausal bleeding.

## 2022-12-26 ENCOUNTER — Encounter (HOSPITAL_BASED_OUTPATIENT_CLINIC_OR_DEPARTMENT_OTHER): Payer: Self-pay | Admitting: Obstetrics & Gynecology

## 2022-12-26 ENCOUNTER — Other Ambulatory Visit (HOSPITAL_COMMUNITY)
Admission: RE | Admit: 2022-12-26 | Discharge: 2022-12-26 | Disposition: A | Discharge status: Home / Self Care | Payer: Medicare Other | Source: Ambulatory Visit | Attending: Obstetrics & Gynecology | Admitting: Obstetrics & Gynecology

## 2022-12-26 ENCOUNTER — Ambulatory Visit (INDEPENDENT_AMBULATORY_CARE_PROVIDER_SITE_OTHER): Payer: Medicare Other | Admitting: Obstetrics & Gynecology

## 2022-12-26 VITALS — BP 110/66 | HR 72 | Ht 59.0 in | Wt 210.4 lb

## 2022-12-26 DIAGNOSIS — N950 Postmenopausal bleeding: Secondary | ICD-10-CM | POA: Insufficient documentation

## 2022-12-26 DIAGNOSIS — Z124 Encounter for screening for malignant neoplasm of cervix: Secondary | ICD-10-CM | POA: Insufficient documentation

## 2022-12-26 NOTE — Progress Notes (Signed)
GYNECOLOGY  VISIT  CC:   postmenopausal bleeding  HPI: 67 y.o. G2P2 Married White or Caucasian female here for complaint of PMP bleeding that started earlier this week as just a little pink with wiping.  It has increased to bright red and she felt she needed to be seen.  Denies any cramping.  Does have a lot of low back pain so didn't want to ignore anything.  H/O endometrial polyp with hysteroscopy in 2014.  Recommended with endometrial biopsy or pelvic ultrasound to begin the evaluation.  She is ok with having the biopsy done today.  Understands there may be additional testing that is needed depending on results.   Past Medical History:  Diagnosis Date   Arthritis    knees, back   Bursitis    hips - almost gone per pt    Cancer ([place])    Carcinoid tumor of lung 2012   right found incidentally on chest xray eval for r/o vasxculitis    Carpal tunnel syndrome, bilateral    bilat but surgery to correct    Coronary artery calcification 07/23/2016   Double vision    history, no current problem   Dysrhythmia    irregular occassional, no current problem   Frequent UTI    HNP (herniated nucleus pulposus), cervical 01/22/2011   C6-C7  Has seen dr Vertell Limber for this and Dr Tonita Cong  Positional numbness in hands no weakness     Hypertension    Hypothyroidism    Infertility, female    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  Download 08/2014:  Good compliance, breakthru apnea on 10cm.  Changed to auto 5-12cm >> good control of AHI on f/u download ONO on CPAP:       Osteoarthritis    PCOS (polycystic ovarian syndrome)    Sleep apnea    wears cpap    SUI (stress urinary incontinence, female)    h/o    MEDS:   Current Outpatient Medications on File Prior to Visit  Medication Sig Dispense Refill   aspirin EC 81 MG tablet Take 81 mg by mouth at bedtime.     atorvastatin (LIPITOR) 40 MG tablet Take 1 tablet (40 mg  total) by mouth daily. 90 tablet 3   CALCIUM PO Take 1 tablet by mouth at bedtime.      chlorthalidone (HYGROTON) 25 MG tablet Take 1 tablet (25 mg total) by mouth daily. 90 tablet 3   denosumab (PROLIA) 60 MG/ML SOSY injection Inject 60 mg into the skin every 6 (six) months.     diltiazem (CARDIZEM CD) 180 MG 24 hr capsule Take 1 capsule (180 mg total) by mouth daily. 90 capsule 3   diltiazem (CARDIZEM) 30 MG tablet 1 tablet every 6 hours for palpitations 60 tablet 3   estradiol (ESTRACE VAGINAL) 0.1 MG/GM vaginal cream INSERT 1 GRAM VAGINALLY TWICE WEEKLY 42.5 g 3   gabapentin (NEURONTIN) 300 MG capsule TAKE 1 CAPSULE AT BEDTIME 90 capsule 3   GEMTESA 75 MG TABS Take 1 tablet by mouth daily as needed.     metoprolol succinate (TOPROL-XL) 50 MG 24 hr tablet Take 1 tablet (50 mg total) by mouth daily. 90 tablet 3   Multiple Vitamins-Minerals (PRESERVISION AREDS 2 PO) Take 1 tablet by mouth 2 (two) times daily.     nitrofurantoin, macrocrystal-monohydrate, (MACROBID) 100 MG capsule Take 1 capsule (100 mg total) by mouth daily. 90 capsule 3  sulfamethoxazole-trimethoprim (BACTRIM DS) 800-160 MG tablet Take 1 tablet by mouth 2 (two) times daily. 10 tablet 1   SYNTHROID 137 MCG tablet TAKE 1 TABLET DAILY (NEED TO SCHEDULE APPOINTMENT FOR MORE REFILLS. NEED TO KEEP UPCOMING APPOINTMENT) 90 tablet 3   tirzepatide (ZEPBOUND) 10 MG/0.5ML Pen Inject 10 mg into the skin once a week. 2 mL 0   No current facility-administered medications on file prior to visit.    ALLERGIES: Neomycin and Ace inhibitors  SH:  Married, non smoker  Review of Systems  Constitutional: Negative.     PHYSICAL EXAMINATION:    BP 110/66 (BP Location: Right Arm, Patient Position: Sitting, Cuff Size: Large)   Pulse 72   Ht 4\' 11"  (1.499 m)   Wt 210 lb 6.4 oz (95.4 kg)   LMP 02/12/2011   BMI 42.50 kg/m     General appearance: alert, cooperative and appears stated age  Pelvic: External genitalia:  no lesions               Urethra:  normal appearing urethra with no masses, tenderness or lesions              Bartholins and Skenes: normal                 Vagina: normal appearing vagina with normal color and discharge, no lesions              Cervix: no lesions  Endometrial biopsy recommended.  Discussed with patient.  Verbal and written consent obtained.   Procedure:  Speculum placed.  Cervix visualized and cleansed with betadine prep.  A single toothed tenaculum was applied to the anterior lip of the cervix.  Endometrial pipelle was advanced through the cervix into the endometrial cavity without difficulty.  Pipelle passed to 8cm.  Suction applied and pipelle removed with scant tissue sample obtained.  Second pass performed.  Tenaculum removed.  No bleeding noted.  Patient tolerated procedure well.  Chaperone, Octaviano Batty, CMA, was present for exam.  Assessment/Plan: 1. Postmenopausal bleeding - Surgical pathology( [place]/ [facility]) - Cytology - PAP( [place])

## 2022-12-29 ENCOUNTER — Ambulatory Visit: Payer: Medicare Other

## 2022-12-29 DIAGNOSIS — I480 Paroxysmal atrial fibrillation: Secondary | ICD-10-CM | POA: Diagnosis not present

## 2022-12-29 LAB — SURGICAL PATHOLOGY

## 2022-12-30 LAB — CUP PACEART REMOTE DEVICE CHECK
Date Time Interrogation Session: 20240127230731
Implantable Pulse Generator Implant Date: 20220620

## 2022-12-31 NOTE — Addendum Note (Signed)
Addended by: Megan Salon on: 12/31/2022 05:38 PM   Modules accepted: Orders

## 2023-01-01 LAB — CYTOLOGY - PAP
Diagnosis: NEGATIVE
Diagnosis: REACTIVE

## 2023-01-06 ENCOUNTER — Ambulatory Visit (HOSPITAL_BASED_OUTPATIENT_CLINIC_OR_DEPARTMENT_OTHER)
Admission: RE | Admit: 2023-01-06 | Discharge: 2023-01-06 | Disposition: A | Discharge status: Home / Self Care | Payer: Medicare Other | Source: Ambulatory Visit | Attending: Obstetrics & Gynecology | Admitting: Obstetrics & Gynecology

## 2023-01-06 DIAGNOSIS — N950 Postmenopausal bleeding: Secondary | ICD-10-CM | POA: Diagnosis present

## 2023-01-06 NOTE — Progress Notes (Unsigned)
Nichole Smith Phone: [phone] Subjective:   Nichole Smith, am serving as a scribe for Dr. Hulan Saas.  I'm seeing this patient by the request  of:  Panosh, Standley Brooking, MD  CC: Foot pain, ankle pain and back pain follow-up  [id number]  11/05/2022 Improvement noted but still has significant tendinitis noted.  We discussed with patient to continue the heel lift, home exercises, icing regimen, increase activity slowly.  Follow-up with me again 2 months to make sure it completely resolved.     Update 01/07/2023 Nichole Smith is a 67 y.o. female coming in with complaint of R foot and L hip pain. Saw Dr. Georgina Smith for her L hip in January. Patient states right foot still in some discomfort but able to walk. Dr. Georgina Smith gave her an injection in the hip and that has helped but still can't lead with that leg going up or down stairs.      Past Medical History:  Diagnosis Date   Arthritis    knees, back   Bursitis    hips - almost gone per pt    Cancer ([place])    Carcinoid tumor of lung 2012   right found incidentally on chest xray eval for r/o vasxculitis    Carpal tunnel syndrome, bilateral    bilat but surgery to correct    Coronary artery calcification 07/23/2016   Double vision    history, no current problem   Dysrhythmia    irregular occassional, no current problem   Frequent UTI    HNP (herniated nucleus pulposus), cervical 01/22/2011   C6-C7  Has seen dr Vertell Limber for this and Dr Tonita Cong  Positional numbness in hands no weakness     Hypertension    Hypothyroidism    Infertility, female    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  Download 08/2014:  Good compliance, breakthru apnea on 10cm.  Changed to auto 5-12cm >> good control of AHI on f/u download ONO on CPAP:       Osteoarthritis    PCOS (polycystic ovarian syndrome)     Sleep apnea    wears cpap    SUI (stress urinary incontinence, female)    h/o   Past Surgical History:  Procedure Laterality Date   ANTERIOR CERVICAL DECOMP/DISCECTOMY FUSION  12/24/2017   Procedure: Cervical five-six Cervical six-seven Anterior cervical decompression/discectomy/fusion;  Surgeon: Erline Levine, MD;  Location: [place];  Service: Neurosurgery;;   CARPAL TUNNEL RELEASE Left 12/24/2017   Procedure: LEFT CARPAL TUNNEL RELEASE;  Surgeon: Erline Levine, MD;  Location: [facility];  Service: Neurosurgery;  Laterality: Left;   CESAREAN SECTION     x 1 - twins   COLONOSCOPY     DILATATION & CURRETTAGE/HYSTEROSCOPY WITH RESECTOCOPE N/A 07/18/2013   Procedure: [place];  Surgeon: Lyman Speller, MD;  Location: [facility];  Service: Gynecology;  Laterality: N/A;  mass resection   implantable loop recorder placement  05/20/2021   Medtronic Reveal Linq model LNQ22 implantable loop recorder (SN [id number])   Lung tumor removed     Rt lung carcinoid   MOUTH SURGERY     pre-cancerous ulcer removed   POLYPECTOMY     SHOULDER OPEN ROTATOR CUFF REPAIR  07/20/2010   Right   WISDOM TOOTH EXTRACTION     Social History   Socioeconomic History   Marital  status: Married    Spouse name: Nichole Smith   Number of children: 2   Years of education: Not on file   Highest education level: Bachelor's degree (e.g., BA, AB, BS)  Occupational History   Occupation: retired    Fish farm manager: [employer]  Tobacco Use   Smoking status: Never   Smokeless tobacco: Never  Vaping Use   Vaping Use: Never used  Substance and Sexual Activity   Alcohol use: Not Currently   Drug use: No   Sexual activity: Not Currently    Partners: Male    Birth control/protection: Post-menopausal  Other Topics Concern   Not on file  Social History Narrative   hhof 2    2 Children at college and  beyond      Nichole Smith    Married    Retired age 72 2015    Recently moved taking care of grandchildren during the week.   Social Determinants of Health   Financial Resource Strain: Low Risk  (11/12/2022)   Overall Financial Resource Strain (CARDIA)    Difficulty of Paying Living Expenses: Not hard at all  Food Insecurity: No Food Insecurity (11/12/2022)   Hunger Vital Sign    Worried About Running Out of Food in the Last Year: Never true    Ran Out of Food in the Last Year: Never true  Transportation Needs: No Transportation Needs (11/12/2022)   PRAPARE - Hydrologist (Medical): No    Lack of Transportation (Non-Medical): No  Physical Activity: Insufficiently Active (11/12/2022)   Exercise Vital Sign    Days of Exercise per Week: 5 days    Minutes of Exercise per Session: 20 min  Stress: No Stress Concern Present (11/12/2022)   [place]    Feeling of Stress : Not at all  Social Connections: [place] (11/12/2022)   Social Connection and Isolation Panel [NHANES]    Frequency of Communication with Friends and Family: More than three times a week    Frequency of Social Gatherings with Friends and Family: More than three times a week    Attends Religious Services: More than 4 times per year    Active Member of [employer] or Organizations: Yes    Attends Music therapist: More than 4 times per year    Marital Status: Married   Allergies  Allergen Reactions   Neomycin Other (See Comments)    Inflammation--eye drop    Ace Inhibitors Cough   Family History  Problem Relation Age of Onset   Stroke Mother        died age 54   Hypertension Mother    Sudden death Mother    Alcoholism Mother    Coronary artery disease Father        died age 6   Hypertension Father    Liver disease Father    Alcohol abuse Father    Obesity Father    Parkinson's disease Brother    Diabetes Brother    Hypertension Brother    Hypertension  Maternal Uncle    Colon cancer Neg Hx    Rectal cancer Neg Hx    Stomach cancer Neg Hx    Colon polyps Neg Hx    Esophageal cancer Neg Hx     Current Outpatient Medications (Endocrine & Metabolic):    denosumab (PROLIA) 60 MG/ML SOSY injection, Inject 60 mg into the skin every 6 (six) months.   SYNTHROID  137 MCG tablet, TAKE 1 TABLET DAILY (NEED TO SCHEDULE APPOINTMENT FOR MORE REFILLS. NEED TO KEEP UPCOMING APPOINTMENT)  Current Outpatient Medications (Cardiovascular):    atorvastatin (LIPITOR) 40 MG tablet, Take 1 tablet (40 mg total) by mouth daily.   chlorthalidone (HYGROTON) 25 MG tablet, Take 1 tablet (25 mg total) by mouth daily.   diltiazem (CARDIZEM CD) 180 MG 24 hr capsule, Take 1 capsule (180 mg total) by mouth daily.   diltiazem (CARDIZEM) 30 MG tablet, 1 tablet every 6 hours for palpitations   metoprolol succinate (TOPROL-XL) 50 MG 24 hr tablet, Take 1 tablet (50 mg total) by mouth daily.   Current Outpatient Medications (Analgesics):    aspirin EC 81 MG tablet, Take 81 mg by mouth at bedtime.   Current Outpatient Medications (Other):    CALCIUM PO, Take 1 tablet by mouth at bedtime.    estradiol (ESTRACE VAGINAL) 0.1 MG/GM vaginal cream, INSERT 1 GRAM VAGINALLY TWICE WEEKLY   gabapentin (NEURONTIN) 300 MG capsule, TAKE 1 CAPSULE AT BEDTIME   GEMTESA 75 MG TABS, Take 1 tablet by mouth daily as needed.   Multiple Vitamins-Minerals (PRESERVISION AREDS 2 PO), Take 1 tablet by mouth 2 (two) times daily.   nitrofurantoin, macrocrystal-monohydrate, (MACROBID) 100 MG capsule, Take 1 capsule (100 mg total) by mouth daily.   sulfamethoxazole-trimethoprim (BACTRIM DS) 800-160 MG tablet, Take 1 tablet by mouth 2 (two) times daily.   tirzepatide (ZEPBOUND) 10 MG/0.5ML Pen, Inject 10 mg into the skin once a week.   Reviewed prior external information including notes and imaging from  primary care provider As well as notes that were available from care everywhere and other  healthcare systems.  Past medical history, social, surgical and family history all reviewed in electronic medical record.  No pertanent information unless stated regarding to the chief complaint.   Review of Systems:  No headache, visual changes, nausea, vomiting, diarrhea, constipation, dizziness, abdominal pain, skin rash, fevers, chills, night sweats, weight loss, swollen lymph nodes,  joint swelling, chest pain, shortness of breath, mood changes. POSITIVE muscle aches, body aches  Objective  Pulse 70, height 4\' 11"  (1.499 m), weight 211 lb (95.7 kg), last menstrual period 02/12/2011, SpO2 90 %.   General: No apparent distress alert and oriented x3 mood and affect normal, dressed appropriately.  HEENT: Pupils equal, extraocular movements intact  Respiratory: Patient's speak in full sentences and does not appear short of breath  Cardiovascular: No lower extremity edema, non tender, no erythema  Patient's foot exam does some loss lordosis.  Some tenderness to palpation noted. Antalgic gait noted.  Tender to palpation over the greater trochanteric area on the left side.  Low back though does have tenderness to palpation as well.  Tightness with straight leg test noted.   Impression and Recommendations:    The above documentation has been reviewed and is accurate and complete Lyndal Pulley, DO

## 2023-01-06 NOTE — Progress Notes (Unsigned)
Chief Complaint:   OBESITY Nichole Smith is here to discuss her progress with her obesity treatment plan along with follow-up of her obesity related diagnoses. Nichole Smith is on keeping a food journal and adhering to recommended goals of 1500 calories and 90 protein and states she is following her eating plan approximately 98% of the time. Nichole Smith states she is water walking and strength training for 15 minutes 5 times per week.  Today's visit was #: 98 Starting weight: 78 LBS Starting date: 01/12/2019 Today's weight: 208 LBS Today's date: 12/24/2022 Total lbs lost to date: 1 LB Total lbs lost since last in-office visit: 5 LBS  Interim History: ***  Subjective:   1. Prediabetes *** 15/40/0867, Trulicity 4.5 mg replaced with Zepbound 10 mg.  2. SOBOE (shortness of breath on exertion) *** Discussed labs with patient today. On 01/12/2019, RMR 1756.  On 10/14/2022 RMR 1627.  On 10/22/2020 RMR 1854.  On 10/03/2021 RMR 1570.  Assessment/Plan:   1. Prediabetes Continue Zepbound 10 mg once weekly.  2. SOBOE (shortness of breath on exertion) Changed to lemon plan, 1150-1250 cal + 85 g protein  3. Obesity, Current BMI 42.1 Refill- tirzepatide (ZEPBOUND) 10 MG/0.5ML Pen; Inject 10 mg into the skin once a week.  Dispense: 2 mL; Refill: 0  Nichole Smith is currently in the action stage of change. As such, her goal is to continue with weight loss efforts. She has agreed to keeping a food journal and adhering to recommended goals of 1150-1250 calories and 85 protein daily.  Exercise goals:  As needed.  Behavioral modification strategies: increasing lean protein intake, decreasing simple carbohydrates, no skipping meals, meal planning and cooking strategies, and planning for success.  Nichole Smith has agreed to follow-up with our clinic in 4 weeks. She was informed of the importance of frequent follow-up visits to maximize her success with intensive lifestyle modifications for her multiple health conditions.    Objective:   Blood pressure 124/82, pulse 71, temperature 98 F (36.7 C), height 4\' 11"  (1.499 m), weight 208 lb (94.3 kg), last menstrual period 02/12/2011, SpO2 98 %. Body mass index is 42.01 kg/m.  General: Cooperative, alert, well developed, in no acute distress. HEENT: Conjunctivae and lids unremarkable. Cardiovascular: Regular rhythm.  Lungs: Normal work of breathing. Neurologic: No focal deficits.   Lab Results  Component Value Date   CREATININE 0.62 05/13/2022   BUN 21 05/13/2022   NA 142 05/13/2022   K 3.7 05/13/2022   CL 99 05/13/2022   CO2 24 05/13/2022   Lab Results  Component Value Date   ALT 32 08/18/2022   AST 18 08/18/2022   ALKPHOS 60 08/18/2022   BILITOT 0.7 08/18/2022   Lab Results  Component Value Date   HGBA1C 5.7 08/18/2022   HGBA1C 5.5 05/13/2022   HGBA1C 5.5 10/03/2021   HGBA1C 5.3 06/25/2021   HGBA1C 5.4 06/27/2020   Lab Results  Component Value Date   INSULIN 22.2 09/16/2022   INSULIN 12.2 05/13/2022   INSULIN 13.6 10/03/2021   INSULIN 12.2 06/25/2021   INSULIN 13.7 06/27/2020   Lab Results  Component Value Date   TSH 2.70 08/18/2022   Lab Results  Component Value Date   CHOL 119 08/18/2022   HDL 52.70 08/18/2022   LDLCALC 57 08/18/2022   TRIG 46.0 08/18/2022   CHOLHDL 2 08/18/2022   Lab Results  Component Value Date   VD25OH 52.5 09/16/2022   VD25OH 42.3 05/13/2022   VD25OH 62.5 10/03/2021   Lab Results  Component  Value Date   WBC 10.1 09/24/2022   HGB 15.4 (H) 09/24/2022   HCT 45.7 09/24/2022   MCV 92.7 09/24/2022   PLT 237.0 09/24/2022   No results found for: "IRON", "TIBC", "FERRITIN"  Attestation Statements:   Reviewed by clinician on day of visit: allergies, medications, problem list, medical history, surgical history, family history, social history, and previous encounter notes.  I, Davy Pique, RMA, am acting as Location manager for Mina Marble, NP.  I have reviewed the above documentation for  accuracy and completeness, and I agree with the above. -  ***

## 2023-01-07 ENCOUNTER — Ambulatory Visit (INDEPENDENT_AMBULATORY_CARE_PROVIDER_SITE_OTHER): Payer: Medicare Other | Admitting: Family Medicine

## 2023-01-07 ENCOUNTER — Ambulatory Visit: Payer: Medicare Other

## 2023-01-07 VITALS — HR 70 | Ht 59.0 in | Wt 211.0 lb

## 2023-01-07 DIAGNOSIS — S86311A Strain of muscle(s) and tendon(s) of peroneal muscle group at lower leg level, right leg, initial encounter: Secondary | ICD-10-CM | POA: Diagnosis not present

## 2023-01-07 DIAGNOSIS — M7061 Trochanteric bursitis, right hip: Secondary | ICD-10-CM | POA: Diagnosis not present

## 2023-01-07 DIAGNOSIS — M79671 Pain in right foot: Secondary | ICD-10-CM

## 2023-01-07 MED ORDER — KETOROLAC TROMETHAMINE 30 MG/ML IJ SOLN
30.0000 mg | Freq: Once | INTRAMUSCULAR | Status: AC
  Administered 2023-01-07: 30 mg via INTRAMUSCULAR

## 2023-01-07 MED ORDER — METHYLPREDNISOLONE ACETATE 40 MG/ML IJ SUSP
40.0000 mg | Freq: Once | INTRAMUSCULAR | Status: AC
  Administered 2023-01-07: 40 mg via INTRAMUSCULAR

## 2023-01-07 NOTE — Patient Instructions (Signed)
Injection today See you again in 7-8 weeks

## 2023-01-07 NOTE — Assessment & Plan Note (Signed)
Significant improvement overall.  Deferred the ultrasound with patient making significant improvement.  Do not think it would change medical management at the moment.  Still needs to use different orthotics and we did discuss the spenco total support follow-up with me again in 6 to 8 weeks

## 2023-01-07 NOTE — Assessment & Plan Note (Addendum)
More pain on the left side recently.  Given an injection by Dr. Georgina Snell not too long ago.  Will hold at this time on any repeat.  We discussed that it could be more of secondary to the back but patient wants to hold on any type of epidural.  Follow-up again in 6 to 8 weeks due to the pain that we did do a Toradol and Depo-Medrol injection today.  Follow-up with me again in 2 months.

## 2023-01-08 ENCOUNTER — Encounter (HOSPITAL_COMMUNITY): Payer: Self-pay | Admitting: *Deleted

## 2023-01-21 ENCOUNTER — Encounter (INDEPENDENT_AMBULATORY_CARE_PROVIDER_SITE_OTHER): Payer: Self-pay | Admitting: Adult Health

## 2023-01-21 ENCOUNTER — Ambulatory Visit (INDEPENDENT_AMBULATORY_CARE_PROVIDER_SITE_OTHER): Payer: Medicare Other | Admitting: Adult Health

## 2023-01-21 VITALS — BP 126/69 | HR 60 | Temp 98.1°F | Ht 59.0 in | Wt 204.0 lb

## 2023-01-21 DIAGNOSIS — R7303 Prediabetes: Secondary | ICD-10-CM

## 2023-01-21 DIAGNOSIS — R632 Polyphagia: Secondary | ICD-10-CM

## 2023-01-21 DIAGNOSIS — Z9189 Other specified personal risk factors, not elsewhere classified: Secondary | ICD-10-CM

## 2023-01-21 DIAGNOSIS — Z6841 Body Mass Index (BMI) 40.0 and over, adult: Secondary | ICD-10-CM | POA: Diagnosis not present

## 2023-01-21 DIAGNOSIS — E669 Obesity, unspecified: Secondary | ICD-10-CM | POA: Diagnosis not present

## 2023-01-21 MED ORDER — ZEPBOUND 10 MG/0.5ML ~~LOC~~ SOAJ: 10.0000 mg | SUBCUTANEOUS | 0 refills | Status: DC

## 2023-01-21 NOTE — Progress Notes (Signed)
Chief Complaint:   OBESITY Nichole Smith is here to discuss her progress with her obesity treatment plan along with follow-up of her obesity related diagnoses. Nichole Smith is on keeping a food journal and adhering to recommended goals of 1150-1250 calories and 85-90 protein and states Nichole Smith is following her eating plan approximately 95% compliance of protein and 50% calories of the time.  Nichole Smith states Nichole Smith is walking/swimming/strength training 20 minutes 7 times per week.  Today's visit was #: 5 Starting weight: 22 Lbs Starting date: 01/12/2019 Today's weight: 204 lbs Today's date: 01/21/2023 Total lbs lost to date: 34 lbs Total lbs lost since last in-office visit: - 4lbs  Interim History:  Nichole Smith was previously on 1500 cal/90g protein day Journaling plan. 12/24/22- Last OV, her prescribed meal plan was adjusted to 1150-1250cal witrh 85 g protein a day to account for RMR of 1627 on 10/14/22 Nichole Smith estimates to hit calorie range 50% and protein goal 95% Nichole Smith is down 4 net lbs since last OV.  Nichole Smith endorses polyphagia since last OV and change in calorie/protein goals.  Subjective:   1. Prediabetes Lab Results  Component Value Date   HGBA1C 5.7 08/18/2022   HGBA1C 5.5 05/13/2022   HGBA1C 5.5 10/03/2021   Nichole Smith is weekly Zepbound 10mg - denies mass in neck, dysphagia, dyspepsia, persistent hoarseness, abdominal pain, or N/V/C Nichole Smith is paying out of pocket for Zepbound- monthly therapy is $1079 per box.  2. Polyphagia Nichole Smith was previously on 1500 cal/90g protein day Journaling plan. 12/24/22- Journaling plan adjusted to 1150-1250 cal/85 g protein. Nichole Smith endorses polyphagia since last OV and change in calorie/protein goals.   3. Early waking Go to bed at 2200 with book, fall asleep 2300 Nichole Smith reports waking almost hourly. CPAP records 5-6 hours sleep/night  Assessment/Plan:   1. Prediabetes Continue to reduce sugar/CHO and regular exercise.  2. Polyphagia Increase protein intake, at least 90g  /day Continue current Zepbound 10mg  dose  3. Early waking Practice Good Sleep Hygiene  Continue nightly CPAP use Nightly Melatonin for 1-2 weeks Stop afternoon naps  4. Obesity, Current BMI 42.1 Nichole Smith - tirzepatide (ZEPBOUND) 10 MG/0.5ML Pen; Inject 10 mg into the skin once a week.  Dispense: 2 mL; Refill: 0  Nichole Smith is currently in the action stage of change. As such, her goal is to continue with weight loss efforts. Nichole Smith has agreed to keeping a food journal and adhering to recommended goals of 1300 calories and 90 protein.   Exercise goals: Older adults with chronic conditions should understand whether and how their conditions affect their ability to do regular physical activity safely.  Behavioral modification strategies: increasing lean protein intake, decreasing simple carbohydrates, increasing vegetables, increasing water intake, better snacking choices, and planning for success.  Nichole Smith has agreed to follow-up with our clinic in 4 weeks. Nichole Smith was informed of the importance of frequent follow-up visits to maximize her success with intensive lifestyle modifications for her multiple health conditions.   Check fasting labs 4 weeks at next OV  Check IC in Spring 2024 Objective:   Blood pressure 126/69, pulse 60, temperature 98.1 F (36.7 C), height 4\' 11"  (1.499 m), weight 204 lb (92.5 kg), last menstrual period 02/12/2011, SpO2 97 %. Body mass index is 41.2 kg/m.  General: Cooperative, alert, well developed, in no acute distress. HEENT: Conjunctivae and lids unremarkable. Cardiovascular: Regular rhythm.  Lungs: Normal work of breathing. Neurologic: No focal deficits.   Lab Results  Component Value Date   CREATININE 0.62 05/13/2022   BUN  21 05/13/2022   NA 142 05/13/2022   K 3.7 05/13/2022   CL 99 05/13/2022   CO2 24 05/13/2022   Lab Results  Component Value Date   ALT 32 08/18/2022   AST 18 08/18/2022   ALKPHOS 60 08/18/2022   BILITOT 0.7 08/18/2022   Lab Results   Component Value Date   HGBA1C 5.7 08/18/2022   HGBA1C 5.5 05/13/2022   HGBA1C 5.5 10/03/2021   HGBA1C 5.3 06/25/2021   HGBA1C 5.4 06/27/2020   Lab Results  Component Value Date   INSULIN 22.2 09/16/2022   INSULIN 12.2 05/13/2022   INSULIN 13.6 10/03/2021   INSULIN 12.2 06/25/2021   INSULIN 13.7 06/27/2020   Lab Results  Component Value Date   TSH 2.70 08/18/2022   Lab Results  Component Value Date   CHOL 119 08/18/2022   HDL 52.70 08/18/2022   LDLCALC 57 08/18/2022   TRIG 46.0 08/18/2022   CHOLHDL 2 08/18/2022   Lab Results  Component Value Date   VD25OH 52.5 09/16/2022   VD25OH 42.3 05/13/2022   VD25OH 62.5 10/03/2021   Lab Results  Component Value Date   WBC 10.1 09/24/2022   HGB 15.4 (H) 09/24/2022   HCT 45.7 09/24/2022   MCV 92.7 09/24/2022   PLT 237.0 09/24/2022   No results found for: "IRON", "TIBC", "FERRITIN"  Attestation Statements:   Reviewed by clinician on day of visit: allergies, medications, problem list, medical history, surgical history, family history, social history, and previous encounter notes.  I have reviewed the above documentation for accuracy and completeness, and I agree with the above. -  Shamari Trostel d. Jeorge Reister, NP-C

## 2023-01-28 ENCOUNTER — Encounter (INDEPENDENT_AMBULATORY_CARE_PROVIDER_SITE_OTHER): Payer: Self-pay

## 2023-02-02 ENCOUNTER — Ambulatory Visit (INDEPENDENT_AMBULATORY_CARE_PROVIDER_SITE_OTHER): Payer: Medicare Other

## 2023-02-02 DIAGNOSIS — I480 Paroxysmal atrial fibrillation: Secondary | ICD-10-CM | POA: Diagnosis not present

## 2023-02-03 LAB — CUP PACEART REMOTE DEVICE CHECK
Date Time Interrogation Session: 20240303231712
Implantable Pulse Generator Implant Date: 20220620

## 2023-02-13 NOTE — Progress Notes (Signed)
[facility] Summary Report / Loop Recorder 

## 2023-02-18 ENCOUNTER — Encounter (INDEPENDENT_AMBULATORY_CARE_PROVIDER_SITE_OTHER): Payer: Self-pay | Admitting: Adult Health

## 2023-02-18 ENCOUNTER — Ambulatory Visit (INDEPENDENT_AMBULATORY_CARE_PROVIDER_SITE_OTHER): Payer: Medicare Other | Admitting: Adult Health

## 2023-02-18 DIAGNOSIS — R7303 Prediabetes: Secondary | ICD-10-CM

## 2023-02-18 DIAGNOSIS — R632 Polyphagia: Secondary | ICD-10-CM | POA: Diagnosis not present

## 2023-02-18 DIAGNOSIS — Z6841 Body Mass Index (BMI) 40.0 and over, adult: Secondary | ICD-10-CM

## 2023-02-18 DIAGNOSIS — E669 Obesity, unspecified: Secondary | ICD-10-CM

## 2023-02-18 DIAGNOSIS — I10 Essential (primary) hypertension: Secondary | ICD-10-CM | POA: Diagnosis not present

## 2023-02-18 DIAGNOSIS — E559 Vitamin D deficiency, unspecified: Secondary | ICD-10-CM | POA: Diagnosis not present

## 2023-02-18 MED ORDER — ZEPBOUND 12.5 MG/0.5ML ~~LOC~~ SOAJ: 12.5000 mg | SUBCUTANEOUS | 1 refills | Status: DC

## 2023-02-18 NOTE — Progress Notes (Signed)
WEIGHT SUMMARY AND BIOMETRICS  Vitals Temp: 97.7 F (36.5 C) BP: 137/78 Pulse Rate: (!) 57 SpO2: 96 %   Anthropometric Measurements Height: 4\' 11"  (1.499 m) Weight: 201 lb (91.2 kg) BMI (Calculated): 40.58 Weight at Last Visit: 204lb Weight Lost Since Last Visit: 3lb Weight Gained Since Last Visit: 0 Starting Weight: 238lb Total Weight Loss (lbs): 37 lb (16.8 kg)   Body Composition  Body Fat %: 49 % Fat Mass (lbs): 98.6 lbs Muscle Mass (lbs): 97.6 lbs Total Body Water (lbs): 71.6 lbs Visceral Fat Rating : 17   Other Clinical Data Fasting: yes Labs: yes Today's Visit #: 68 Starting Date: 01/12/19    Chief Complaint:   OBESITY Nichole Smith is here to discuss her progress with her obesity treatment plan. She is on the keeping a food journal and adhering to recommended goals of 1300 calories and 90 protein and states she is following her eating plan approximately Calorie- 61 % Protein - 93% of the time. She states she is exercising:swimming, walking, strength training at least 25 minutes 7 times per week.   Interim History:  Injectable Hx: Fall 99991111 on Trulicuty 4.5mg  once weekly injection Dr. Leafy Ro converted Trulicity 4.5mg  to Zepbound 10mg  once weekly injection-she started in Jan 2024. She continues to experience late afternoon polyphagia. She is paying out of pocket for Zepbound- >$1000 month for therapy.  Subjective:   1. Essential hypertension BP stable at OV Relevant Medications  aspirin EC 81 MG tablet  diltiazem (CARDIZEM) 30 MG tablet  diltiazem (CARDIZEM CD) 180 MG 24 hr capsule  atorvastatin (LIPITOR) 40 MG tablet  chlorthalidone (HYGROTON) 25 MG tablet  metoprolol succinate (TOPROL-XL) 50 MG 24 hr tablet    2. Polyphagia Fall 99991111 on Trulicuty 4.5mg  once weekly injection Dr. Leafy Ro converted Trulicity 4.5mg  to Zepbound 10mg  once weekly injection-she started in Jan 2024. She continues to experience late afternoon polyphagia. Denies mass  in neck, dysphagia, dyspepsia, persistent hoarseness, abdominal pain, or N/V/or worsening constipation.  3. Vitamin D deficiency She is on OTC Vitamin D-3, unknown of dose and OTC multivitamin.   4. Prediabetes Fall 99991111 on Trulicuty 4.5mg  once weekly injection Dr. Leafy Ro converted Trulicity 4.5mg  to Zepbound 10mg  once weekly injection-she started in Jan 2024. She continues to experience late afternoon polyphagia.  Assessment/Plan:   1. Essential hypertension Check Labs - Comprehensive metabolic panel  2. Polyphagia Continue to consume at least 90g protein/day Increase Zepbound  3. Vitamin D deficiency Check Labs - VITAMIN D 25 Hydroxy (Vit-D Deficiency, Fractures)  4. Prediabetes Check Labs - Vitamin B12 - Hemoglobin A1c - Insulin, random  5. Obesity, Current BMI 40.58 Refill and increase  Zepbound 12.5mg  once weekly injection Disp 32ml RF 1  [name] is currently in the action stage of change. As such, her goal is to continue with weight loss efforts. She has agreed to keeping a food journal and adhering to recommended goals of 1300 calories and 90 protein.   Exercise goals: Older adults should follow the adult guidelines. When older adults cannot meet the adult guidelines, they should be as physically active as their abilities and conditions will allow.  Older adults should do exercises that maintain or improve balance if they are at risk of falling.  Older adults with chronic conditions should understand whether and how their conditions affect their ability to do regular physical activity safely.  Behavioral modification strategies: increasing lean protein intake, decreasing simple carbohydrates, increasing vegetables, increasing water intake, and planning for success.  [name]  has agreed to follow-up with our clinic in 4 weeks. She was informed of the importance of frequent follow-up visits to maximize her success with intensive lifestyle modifications for her multiple health  conditions.   [name] was informed we would discuss her lab results at her next visit unless there is a critical issue that needs to be addressed sooner. [name] agreed to keep her next visit at the agreed upon time to discuss these results.  Objective:   Blood pressure 137/78, pulse (!) 57, temperature 97.7 F (36.5 C), height 4\' 11"  (1.499 m), weight 201 lb (91.2 kg), last menstrual period 02/12/2011, SpO2 96 %. Body mass index is 40.6 kg/m.  General: Cooperative, alert, well developed, in no acute distress. HEENT: Conjunctivae and lids unremarkable. Cardiovascular: Regular rhythm.  Lungs: Normal work of breathing. Neurologic: No focal deficits.   Lab Results  Component Value Date   CREATININE 0.62 05/13/2022   BUN 21 05/13/2022   NA 142 05/13/2022   K 3.7 05/13/2022   CL 99 05/13/2022   CO2 24 05/13/2022   Lab Results  Component Value Date   ALT 32 08/18/2022   AST 18 08/18/2022   ALKPHOS 60 08/18/2022   BILITOT 0.7 08/18/2022   Lab Results  Component Value Date   HGBA1C 5.7 08/18/2022   HGBA1C 5.5 05/13/2022   HGBA1C 5.5 10/03/2021   HGBA1C 5.3 06/25/2021   HGBA1C 5.4 06/27/2020   Lab Results  Component Value Date   INSULIN 22.2 09/16/2022   INSULIN 12.2 05/13/2022   INSULIN 13.6 10/03/2021   INSULIN 12.2 06/25/2021   INSULIN 13.7 06/27/2020   Lab Results  Component Value Date   TSH 2.70 08/18/2022   Lab Results  Component Value Date   CHOL 119 08/18/2022   HDL 52.70 08/18/2022   LDLCALC 57 08/18/2022   TRIG 46.0 08/18/2022   CHOLHDL 2 08/18/2022   Lab Results  Component Value Date   VD25OH 52.5 09/16/2022   VD25OH 42.3 05/13/2022   VD25OH 62.5 10/03/2021   Lab Results  Component Value Date   WBC 10.1 09/24/2022   HGB 15.4 (H) 09/24/2022   HCT 45.7 09/24/2022   MCV 92.7 09/24/2022   PLT 237.0 09/24/2022   No results found for: "IRON", "TIBC", "FERRITIN"   Attestation Statements:   Reviewed by clinician on day of visit: allergies,  medications, problem list, medical history, surgical history, family history, social history, and previous encounter notes.  I have reviewed the above documentation for accuracy and completeness, and I agree with the above. -  Sharetta Ricchio d. Mariadel Mruk, NP-C

## 2023-02-19 LAB — COMPREHENSIVE METABOLIC PANEL
ALT: 36 IU/L — ABNORMAL HIGH (ref 0–32)
AST: 29 IU/L (ref 0–40)
Albumin/Globulin Ratio: 1.5 (ref 1.2–2.2)
Albumin: 4.1 g/dL (ref 3.9–4.9)
Alkaline Phosphatase: 71 IU/L (ref 44–121)
BUN/Creatinine Ratio: 32 — ABNORMAL HIGH (ref 12–28)
BUN: 20 mg/dL (ref 8–27)
Bilirubin Total: 0.7 mg/dL (ref 0.0–1.2)
CO2: 29 mmol/L (ref 20–29)
Calcium: 9.6 mg/dL (ref 8.7–10.3)
Chloride: 98 mmol/L (ref 96–106)
Creatinine, Ser: 0.62 mg/dL (ref 0.57–1.00)
Globulin, Total: 2.8 g/dL (ref 1.5–4.5)
Glucose: 79 mg/dL (ref 70–99)
Potassium: 3.7 mmol/L (ref 3.5–5.2)
Sodium: 141 mmol/L (ref 134–144)
Total Protein: 6.9 g/dL (ref 6.0–8.5)
eGFR: 97 mL/min/{1.73_m2} (ref >59)

## 2023-02-19 LAB — INSULIN, RANDOM: INSULIN: 13.8 u[IU]/mL (ref 2.6–24.9)

## 2023-02-19 LAB — VITAMIN B12: Vitamin B-12: 563 pg/mL (ref 232–1245)

## 2023-02-19 LAB — HEMOGLOBIN A1C
Est. average glucose Bld gHb Est-mCnc: 114 mg/dL
Hgb A1c MFr Bld: 5.6 % (ref 4.8–5.6)

## 2023-02-19 LAB — VITAMIN D 25 HYDROXY (VIT D DEFICIENCY, FRACTURES): Vit D, 25-Hydroxy: 62.7 ng/mL (ref 30.0–100.0)

## 2023-02-24 NOTE — Progress Notes (Signed)
[place] Phone: [phone] Subjective:   [name], am serving as a scribe for Dr. Hulan Saas.  I'm seeing this patient by the request  of:  Panosh, Standley Brooking, MD  CC: right foot and right hip pain   [id number]  01/07/2023 More pain on the left side recently.  Given an injection by Dr. Georgina Snell not too long ago.  Will hold at this time on any repeat.  We discussed that it could be more of secondary to the back but patient wants to hold on any type of epidural.  Follow-up again in 6 to 8 weeks due to the pain that we did do a Toradol and Depo-Medrol injection today.  Follow-up with me again in 2 months.     Significant improvement overall.  Deferred the ultrasound with patient making significant improvement.  Do not think it would change medical management at the moment.  Still needs to use different orthotics and we did discuss the spenco total support follow-up with me again in 6 to 8 weeks     Update 03/04/2023 Nichole Smith is a 67 y.o. female coming in with complaint of R foot and L hip pain. Patient states that her foot pain is manageable. Pain L SI joint that wraps around to anterior aspect of the hip. Also c/o pain in lateral hip that radiates down into the hamstring. Patient wakes up with little pain, goes for a walk and then her pain increases. Able to walk off the pain but as the day goes on it becomes harder to walk off the pain. Unable to ascend stairs with L leg. Injection by Dr. Georgina Snell was helpful but did not last. Wonders if she needs another epidural. Last epidural was Sept 11, 2023.   Wants to know what mediations she should be taking. Also wants to know what alternatives she has in order to manage pain ie. Chiro care or anything else.      Past Medical History:  Diagnosis Date   Arthritis    knees, back   Bursitis    hips - almost gone per pt    Cancer ([place])    Carcinoid tumor of lung  2012   right found incidentally on chest xray eval for r/o vasxculitis    Carpal tunnel syndrome, bilateral    bilat but surgery to correct    Coronary artery calcification 07/23/2016   Double vision    history, no current problem   Dysrhythmia    irregular occassional, no current problem   Frequent UTI    HNP (herniated nucleus pulposus), cervical 01/22/2011   C6-C7  Has seen dr Vertell Limber for this and Dr Tonita Cong  Positional numbness in hands no weakness     Hypertension    Hypothyroidism    Infertility, female    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  Download 08/2014:  Good compliance, breakthru apnea on 10cm.  Changed to auto 5-12cm >> good control of AHI on f/u download ONO on CPAP:       Osteoarthritis    PCOS (polycystic ovarian syndrome)    Sleep apnea    wears cpap    SUI (stress urinary incontinence, female)    h/o   Past Surgical History:  Procedure Laterality Date   ANTERIOR CERVICAL DECOMP/DISCECTOMY FUSION  12/24/2017   Procedure: Cervical five-six Cervical six-seven Anterior  cervical decompression/discectomy/fusion;  Surgeon: Erline Levine, MD;  Location: [facility];  Service: Neurosurgery;;   CARPAL TUNNEL RELEASE Left 12/24/2017   Procedure: LEFT CARPAL TUNNEL RELEASE;  Surgeon: Erline Levine, MD;  Location: [facility];  Service: Neurosurgery;  Laterality: Left;   CESAREAN SECTION     x 1 - twins   COLONOSCOPY     DILATATION & CURRETTAGE/HYSTEROSCOPY WITH RESECTOCOPE N/A 07/18/2013   Procedure: [place];  Surgeon: Lyman Speller, MD;  Location: [facility];  Service: Gynecology;  Laterality: N/A;  mass resection   implantable loop recorder placement  05/20/2021   Medtronic Reveal Linq model LNQ22 implantable loop recorder (SN [id number])   Lung tumor removed     Rt lung carcinoid   MOUTH SURGERY     pre-cancerous ulcer removed   POLYPECTOMY      SHOULDER OPEN ROTATOR CUFF REPAIR  07/20/2010   Right   WISDOM TOOTH EXTRACTION     Social History   Socioeconomic History   Marital status: Married    Spouse name: [name]   Number of children: 2   Years of education: Not on file   Highest education level: Bachelor's degree (e.g., BA, AB, BS)  Occupational History   Occupation: retired    Fish farm manager: [employer]  Tobacco Use   Smoking status: Never   Smokeless tobacco: Never  Vaping Use   Vaping Use: Never used  Substance and Sexual Activity   Alcohol use: Not Currently   Drug use: No   Sexual activity: Not Currently    Partners: Male    Birth control/protection: Post-menopausal  Other Topics Concern   Not on file  Social History Narrative   hhof 2    2 Children at college and  beyond      [name]    Married    Retired age 58 2015   Recently moved taking care of grandchildren during the week.   Social Determinants of Health   Financial Resource Strain: Low Risk  (11/12/2022)   Overall Financial Resource Strain (CARDIA)    Difficulty of Paying Living Expenses: Not hard at all  Food Insecurity: No Food Insecurity (11/12/2022)   Hunger Vital Sign    Worried About Running Out of Food in the Last Year: Never true    Ran Out of Food in the Last Year: Never true  Transportation Needs: No Transportation Needs (11/12/2022)   PRAPARE - Hydrologist (Medical): No    Lack of Transportation (Non-Medical): No  Physical Activity: Insufficiently Active (11/12/2022)   Exercise Vital Sign    Days of Exercise per Week: 5 days    Minutes of Exercise per Session: 20 min  Stress: No Stress Concern Present (11/12/2022)   [place]    Feeling of Stress : Not at all  Social Connections: [place] (11/12/2022)   Social Connection and Isolation Panel [NHANES]    Frequency of Communication with Friends and  Family: More than three times a week    Frequency of Social Gatherings with Friends and Family: More than three times a week    Attends Religious Services: More than 4 times per year    Active Member of [employer] or Organizations: Yes    Attends Music therapist: More than 4 times per year    Marital Status: Married   Allergies  Allergen Reactions   Neomycin Other (See Comments)  Inflammation--eye drop    Ace Inhibitors Cough   Family History  Problem Relation Age of Onset   Stroke Mother        died age 71   Hypertension Mother    Sudden death Mother    Alcoholism Mother    Coronary artery disease Father        died age 69   Hypertension Father    Liver disease Father    Alcohol abuse Father    Obesity Father    Parkinson's disease Brother    Diabetes Brother    Hypertension Brother    Hypertension Maternal Uncle    Colon cancer Neg Hx    Rectal cancer Neg Hx    Stomach cancer Neg Hx    Colon polyps Neg Hx    Esophageal cancer Neg Hx     Current Outpatient Medications (Endocrine & Metabolic):    denosumab (PROLIA) 60 MG/ML SOSY injection, Inject 60 mg into the skin every 6 (six) months.   predniSONE (DELTASONE) 20 MG tablet, Take 1 tablet (20 mg total) by mouth daily with breakfast.   SYNTHROID 137 MCG tablet, TAKE 1 TABLET DAILY (NEED TO SCHEDULE APPOINTMENT FOR MORE REFILLS. NEED TO KEEP UPCOMING APPOINTMENT)  Current Outpatient Medications (Cardiovascular):    atorvastatin (LIPITOR) 40 MG tablet, Take 1 tablet (40 mg total) by mouth daily.   chlorthalidone (HYGROTON) 25 MG tablet, Take 1 tablet (25 mg total) by mouth daily.   diltiazem (CARDIZEM CD) 180 MG 24 hr capsule, Take 1 capsule (180 mg total) by mouth daily.   diltiazem (CARDIZEM) 30 MG tablet, 1 tablet every 6 hours for palpitations   metoprolol succinate (TOPROL-XL) 50 MG 24 hr tablet, Take 1 tablet (50 mg total) by mouth daily.   Current Outpatient Medications (Analgesics):    aspirin EC  81 MG tablet, Take 81 mg by mouth at bedtime.   Current Outpatient Medications (Other):    CALCIUM PO, Take 1 tablet by mouth at bedtime.    estradiol (ESTRACE VAGINAL) 0.1 MG/GM vaginal cream, INSERT 1 GRAM VAGINALLY TWICE WEEKLY   gabapentin (NEURONTIN) 300 MG capsule, TAKE 1 CAPSULE AT BEDTIME   GEMTESA 75 MG TABS, Take 1 tablet by mouth daily as needed.   Multiple Vitamins-Minerals (PRESERVISION AREDS 2 PO), Take 1 tablet by mouth 2 (two) times daily.   nitrofurantoin, macrocrystal-monohydrate, (MACROBID) 100 MG capsule, Take 1 capsule (100 mg total) by mouth daily.   sulfamethoxazole-trimethoprim (BACTRIM DS) 800-160 MG tablet, Take 1 tablet by mouth 2 (two) times daily.   tirzepatide (ZEPBOUND) 12.5 MG/0.5ML Pen, Inject 12.5 mg into the skin once a week.   Reviewed prior external information including notes and imaging from  primary care provider As well as notes that were available from care everywhere and other healthcare systems.  Past medical history, social, surgical and family history all reviewed in electronic medical record.  No pertanent information unless stated regarding to the chief complaint.   Review of Systems:  No headache, visual changes, nausea, vomiting, diarrhea, constipation, dizziness, abdominal pain, skin rash, fevers, chills, night sweats, weight loss, swollen lymph nodes, body aches, joint swelling, chest pain, shortness of breath, mood changes. POSITIVE muscle aches  Objective  Blood pressure 110/70, pulse 68, height 4\' 11"  (1.499 m), weight 204 lb (92.5 kg), last menstrual period 02/12/2011, SpO2 96 %.   General: No apparent distress alert and oriented x3 mood and affect normal, dressed appropriately.  HEENT: Pupils equal, extraocular movements intact  Respiratory: Patient's speak in  full sentences and does not appear short of breath  Cardiovascular: No lower extremity edema, non tender, no erythema  Right hip exam shows not significantly tender but more  tenderness over the left greater trochanteric area.  No severe actually on exam today.  Negative straight leg test noted though today but does have worsening pain with extension of the back noted.   Procedure: Real-time Ultrasound Guided Injection of left  greater trochanteric bursitis secondary to patient's body habitus Device: GE Logiq Q7  Ultrasound guided injection is preferred based studies that show increased duration, increased effect, greater accuracy, decreased procedural pain, increased response rate, and decreased cost with ultrasound guided versus blind injection.  Verbal informed consent obtained.  Time-out conducted.  Noted no overlying erythema, induration, or other signs of local infection.  Skin prepped in a sterile fashion.  Local anesthesia: Topical Ethyl chloride.  With sterile technique and under real time ultrasound guidance:  Greater trochanteric area was visualized and patient's bursa was noted. A 22-gauge 3 inch needle was inserted and 4 cc of 0.5% Marcaine and 1 cc of Kenalog 40 mg/dL was injected. Pictures taken Completed without difficulty  Pain immediately resolved suggesting accurate placement of the medication.  Advised to call if fevers/chills, erythema, induration, drainage, or persistent bleeding.  Impression: Technically successful ultrasound guided injection.    Impression and Recommendations:    The above documentation has been reviewed and is accurate and complete Lyndal Pulley, DO

## 2023-03-04 ENCOUNTER — Ambulatory Visit (INDEPENDENT_AMBULATORY_CARE_PROVIDER_SITE_OTHER): Payer: Medicare Other | Admitting: Family Medicine

## 2023-03-04 ENCOUNTER — Other Ambulatory Visit: Payer: Self-pay

## 2023-03-04 VITALS — BP 110/70 | HR 68 | Ht 59.0 in | Wt 204.0 lb

## 2023-03-04 DIAGNOSIS — M79671 Pain in right foot: Secondary | ICD-10-CM | POA: Diagnosis not present

## 2023-03-04 DIAGNOSIS — M7062 Trochanteric bursitis, left hip: Secondary | ICD-10-CM | POA: Insufficient documentation

## 2023-03-04 DIAGNOSIS — M5416 Radiculopathy, lumbar region: Secondary | ICD-10-CM | POA: Diagnosis not present

## 2023-03-04 MED ORDER — PREDNISONE 20 MG PO TABS: 20.0000 mg | ORAL_TABLET | Freq: Every day | ORAL | 0 refills | Status: DC

## 2023-03-04 NOTE — Assessment & Plan Note (Signed)
Patient given injection and tolerated the procedure well, discussed icing regimen and home exercises, discussed which activities to do and which ones to avoid, increase activity slowly otherwise.  Follow-up with me again in 3 months.  Differential includes the possibility of lumbar radiculopathy.  Will also order an epidural with patient traveling in the near future.  See if patient needs that with the last one being 7 months ago.

## 2023-03-04 NOTE — Assessment & Plan Note (Signed)
Epidural ordered again and we will see how patient responds.

## 2023-03-04 NOTE — Patient Instructions (Signed)
Injected GT today Epidural ordered Prednisone 20mg  for 7 days if needed See me again in

## 2023-03-09 ENCOUNTER — Ambulatory Visit (INDEPENDENT_AMBULATORY_CARE_PROVIDER_SITE_OTHER): Payer: Medicare Other

## 2023-03-09 DIAGNOSIS — I480 Paroxysmal atrial fibrillation: Secondary | ICD-10-CM | POA: Diagnosis not present

## 2023-03-09 DIAGNOSIS — R002 Palpitations: Secondary | ICD-10-CM

## 2023-03-10 ENCOUNTER — Encounter: Payer: Self-pay | Admitting: Family Medicine

## 2023-03-10 LAB — CUP PACEART REMOTE DEVICE CHECK
Date Time Interrogation Session: 20240405230700
Implantable Pulse Generator Implant Date: 20220620

## 2023-03-16 NOTE — Progress Notes (Unsigned)
No chief complaint on file.   HPI: Nichole Smith 67 y.o. come in for chronic  cough  Under care fo paf ht lumbar radic foo t pain  post menopauseal bleeding  and pre diabetes ROS: See pertinent positives and negatives per HPI.  Past Medical History:  Diagnosis Date   Arthritis    knees, back   Bursitis    hips - almost gone per pt    Cancer (HCC)    Carcinoid tumor of lung 2012   right found incidentally on chest xray eval for r/o vasxculitis    Carpal tunnel syndrome, bilateral    bilat but surgery to correct    Coronary artery calcification 07/23/2016   Double vision    history, no current problem   Dysrhythmia    irregular occassional, no current problem   Frequent UTI    HNP (herniated nucleus pulposus), cervical 01/22/2011   C6-C7  Has seen dr Venetia Maxon for this and Dr Shelle Iron  Positional numbness in hands no weakness     Hypertension    Hypothyroidism    Infertility, female    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  Download 08/2014:  Good compliance, breakthru apnea on 10cm.  Changed to auto 5-12cm >> good control of AHI on f/u download ONO on CPAP:       Osteoarthritis    PCOS (polycystic ovarian syndrome)    Sleep apnea    wears cpap    SUI (stress urinary incontinence, female)    h/o    Family History  Problem Relation Age of Onset   Stroke Mother        died age 7   Hypertension Mother    Sudden death Mother    Alcoholism Mother    Coronary artery disease Father        died age 46   Hypertension Father    Liver disease Father    Alcohol abuse Father    Obesity Father    Parkinson's disease Brother    Diabetes Brother    Hypertension Brother    Hypertension Maternal Uncle    Colon cancer Neg Hx    Rectal cancer Neg Hx    Stomach cancer Neg Hx    Colon polyps Neg Hx    Esophageal cancer Neg Hx     Social History   Socioeconomic History   Marital status:  Married    Spouse name: Amada Jupiter   Number of children: 2   Years of education: Not on file   Highest education level: Bachelor's degree (e.g., BA, AB, BS)  Occupational History   Occupation: retired    Associate Professor: Museum/gallery exhibitions officer  Tobacco Use   Smoking status: Never   Smokeless tobacco: Never  Vaping Use   Vaping Use: Never used  Substance and Sexual Activity   Alcohol use: Not Currently   Drug use: No   Sexual activity: Not Currently    Partners: Male    Birth control/protection: Post-menopausal  Other Topics Concern   Not on file  Social History Narrative   hhof 2    2 Children at college and  beyond      [place]    Married    Retired age 32 2015   Recently moved taking care of grandchildren during the week.   Social Determinants of Health   Financial Resource Strain: Low Risk  (11/12/2022)  Overall Financial Resource Strain (CARDIA)    Difficulty of Paying Living Expenses: Not hard at all  Food Insecurity: No Food Insecurity (11/12/2022)   Hunger Vital Sign    Worried About Running Out of Food in the Last Year: Never true    Ran Out of Food in the Last Year: Never true  Transportation Needs: No Transportation Needs (11/12/2022)   PRAPARE - Administrator, Civil Service (Medical): No    Lack of Transportation (Non-Medical): No  Physical Activity: Insufficiently Active (11/12/2022)   Exercise Vital Sign    Days of Exercise per Week: 5 days    Minutes of Exercise per Session: 20 min  Stress: No Stress Concern Present (11/12/2022)   [employer] of Occupational Health - Occupational Stress Questionnaire    Feeling of Stress : Not at all  Social Connections: Socially Integrated (11/12/2022)   Social Connection and Isolation Panel [NHANES]    Frequency of Communication with Friends and Family: More than three times a week    Frequency of Social Gatherings with Friends and Family: More than three times a week    Attends Religious  Services: More than 4 times per year    Active Member of [employer] or Organizations: Yes    Attends Engineer, structural: More than 4 times per year    Marital Status: Married    Outpatient Medications Prior to Visit  Medication Sig Dispense Refill   aspirin EC 81 MG tablet Take 81 mg by mouth at bedtime.     atorvastatin (LIPITOR) 40 MG tablet Take 1 tablet (40 mg total) by mouth daily. 90 tablet 3   CALCIUM PO Take 1 tablet by mouth at bedtime.      chlorthalidone (HYGROTON) 25 MG tablet Take 1 tablet (25 mg total) by mouth daily. 90 tablet 3   denosumab (PROLIA) 60 MG/ML SOSY injection Inject 60 mg into the skin every 6 (six) months.     diltiazem (CARDIZEM CD) 180 MG 24 hr capsule Take 1 capsule (180 mg total) by mouth daily. 90 capsule 3   diltiazem (CARDIZEM) 30 MG tablet 1 tablet every 6 hours for palpitations 60 tablet 3   estradiol (ESTRACE VAGINAL) 0.1 MG/GM vaginal cream INSERT 1 GRAM VAGINALLY TWICE WEEKLY 42.5 g 3   gabapentin (NEURONTIN) 300 MG capsule TAKE 1 CAPSULE AT BEDTIME 90 capsule 3   GEMTESA 75 MG TABS Take 1 tablet by mouth daily as needed.     metoprolol succinate (TOPROL-XL) 50 MG 24 hr tablet Take 1 tablet (50 mg total) by mouth daily. 90 tablet 3   Multiple Vitamins-Minerals (PRESERVISION AREDS 2 PO) Take 1 tablet by mouth 2 (two) times daily.     nitrofurantoin, macrocrystal-monohydrate, (MACROBID) 100 MG capsule Take 1 capsule (100 mg total) by mouth daily. 90 capsule 3   predniSONE (DELTASONE) 20 MG tablet Take 1 tablet (20 mg total) by mouth daily with breakfast. 7 tablet 0   sulfamethoxazole-trimethoprim (BACTRIM DS) 800-160 MG tablet Take 1 tablet by mouth 2 (two) times daily. 10 tablet 1   SYNTHROID 137 MCG tablet TAKE 1 TABLET DAILY (NEED TO SCHEDULE APPOINTMENT FOR MORE REFILLS. NEED TO KEEP UPCOMING APPOINTMENT) 90 tablet 3   tirzepatide (ZEPBOUND) 12.5 MG/0.5ML Pen Inject 12.5 mg into the skin once a week. 3 mL 1   No facility-administered  medications prior to visit.     EXAM:  LMP 02/12/2011   There is no height or weight on file to calculate BMI.  GENERAL: vitals reviewed and listed above, alert, oriented, appears well hydrated and in no acute distress HEENT: atraumatic, conjunctiva  clear, no obvious abnormalities on inspection of external nose and ears OP : no lesion edema or exudate  NECK: no obvious masses on inspection palpation  LUNGS: clear to auscultation bilaterally, no wheezes, rales or rhonchi, good air movement CV: HRRR, no clubbing cyanosis or  peripheral edema nl cap refill  MS: moves all extremities without noticeable focal  abnormality PSYCH: pleasant and cooperative, no obvious depression or anxiety Lab Results  Component Value Date   WBC 10.1 09/24/2022   HGB 15.4 (H) 09/24/2022   HCT 45.7 09/24/2022   PLT 237.0 09/24/2022   GLUCOSE 79 02/18/2023   CHOL 119 08/18/2022   TRIG 46.0 08/18/2022   HDL 52.70 08/18/2022   LDLCALC 57 08/18/2022   ALT 36 (H) 02/18/2023   AST 29 02/18/2023   NA 141 02/18/2023   K 3.7 02/18/2023   CL 98 02/18/2023   CREATININE 0.62 02/18/2023   BUN 20 02/18/2023   CO2 29 02/18/2023   TSH 2.70 08/18/2022   INR 0.96 06/11/2011   HGBA1C 5.6 02/18/2023   BP Readings from Last 3 Encounters:  03/04/23 110/70  02/18/23 137/78  01/21/23 126/69    ASSESSMENT AND PLAN:  Discussed the following assessment and plan:  No diagnosis found.  -Patient advised to return or notify health care team  if  new concerns arise.  There are no Patient Instructions on file for this visit.   Neta Mends. Mervyn Pflaum M.D.

## 2023-03-16 NOTE — Progress Notes (Signed)
[facility] Summary Report / Loop Recorder 

## 2023-03-17 ENCOUNTER — Ambulatory Visit (INDEPENDENT_AMBULATORY_CARE_PROVIDER_SITE_OTHER): Payer: Medicare Other | Admitting: Internal Medicine

## 2023-03-17 ENCOUNTER — Encounter: Payer: Self-pay | Admitting: Internal Medicine

## 2023-03-17 VITALS — BP 110/66 | HR 76 | Temp 97.6°F | Ht 59.0 in | Wt 198.8 lb

## 2023-03-17 DIAGNOSIS — R058 Other specified cough: Secondary | ICD-10-CM

## 2023-03-17 DIAGNOSIS — G4700 Insomnia, unspecified: Secondary | ICD-10-CM

## 2023-03-17 DIAGNOSIS — Z79899 Other long term (current) drug therapy: Secondary | ICD-10-CM

## 2023-03-17 MED ORDER — ZALEPLON 5 MG PO CAPS: 5.0000 mg | ORAL_CAPSULE | Freq: Every evening | ORAL | 0 refills | Status: DC | PRN

## 2023-03-17 MED ORDER — HYDROCODONE BIT-HOMATROP MBR 5-1.5 MG/5ML PO SOLN: 5.0000 mL | Freq: Three times a day (TID) | ORAL | 0 refills | Status: DC | PRN

## 2023-03-17 NOTE — Patient Instructions (Signed)
Cough med as planned  Short term sleep aid  as discussed  contact for refill depending .  Please seek advice from pulmonary about the cough also .

## 2023-03-19 ENCOUNTER — Encounter (HOSPITAL_BASED_OUTPATIENT_CLINIC_OR_DEPARTMENT_OTHER): Payer: Self-pay | Admitting: Obstetrics & Gynecology

## 2023-03-20 ENCOUNTER — Ambulatory Visit
Admission: RE | Admit: 2023-03-20 | Discharge: 2023-03-20 | Disposition: A | Discharge status: Home / Self Care | Payer: Medicare Other | Source: Ambulatory Visit | Attending: Family Medicine | Admitting: Family Medicine

## 2023-03-20 DIAGNOSIS — M5416 Radiculopathy, lumbar region: Secondary | ICD-10-CM

## 2023-03-20 MED ORDER — METHYLPREDNISOLONE ACETATE 40 MG/ML INJ SUSP (RADIOLOG
80.0000 mg | Freq: Once | INTRAMUSCULAR | Status: AC
  Administered 2023-03-20: 80 mg via EPIDURAL

## 2023-03-20 MED ORDER — IOPAMIDOL (ISOVUE-M 200) INJECTION 41%
1.0000 mL | Freq: Once | INTRAMUSCULAR | Status: AC
  Administered 2023-03-20: 1 mL via EPIDURAL

## 2023-03-20 NOTE — Discharge Instructions (Signed)

## 2023-03-26 ENCOUNTER — Ambulatory Visit (INDEPENDENT_AMBULATORY_CARE_PROVIDER_SITE_OTHER): Payer: Medicare Other | Admitting: Obstetrics & Gynecology

## 2023-03-26 ENCOUNTER — Encounter (HOSPITAL_BASED_OUTPATIENT_CLINIC_OR_DEPARTMENT_OTHER): Payer: Self-pay | Admitting: Obstetrics & Gynecology

## 2023-03-26 VITALS — BP 138/76 | HR 77 | Ht 59.0 in | Wt 199.8 lb

## 2023-03-26 DIAGNOSIS — N390 Urinary tract infection, site not specified: Secondary | ICD-10-CM | POA: Diagnosis not present

## 2023-03-26 DIAGNOSIS — Z6841 Body Mass Index (BMI) 40.0 and over, adult: Secondary | ICD-10-CM | POA: Diagnosis not present

## 2023-03-26 DIAGNOSIS — N950 Postmenopausal bleeding: Secondary | ICD-10-CM | POA: Diagnosis not present

## 2023-03-26 MED ORDER — SULFAMETHOXAZOLE-TRIMETHOPRIM 800-160 MG PO TABS
1.0000 | ORAL_TABLET | Freq: Two times a day (BID) | ORAL | 0 refills | Status: DC
Start: 2023-03-26 — End: 2023-04-20

## 2023-03-29 DIAGNOSIS — N950 Postmenopausal bleeding: Secondary | ICD-10-CM | POA: Insufficient documentation

## 2023-03-29 DIAGNOSIS — N390 Urinary tract infection, site not specified: Secondary | ICD-10-CM | POA: Insufficient documentation

## 2023-03-29 NOTE — Progress Notes (Signed)
GYNECOLOGY  VISIT  CC:   PMP bleeding  HPI: 67 y.o. G2P2 Married White or Caucasian female here for discussion of additional evaluation for recurrent PMP bleeding.  Pt experienced another episode of bleeding that was bright red.  She had this in January and had ultrasound showing endometrium of 3mm and endometrial biopsy which showed benign tissue and pap smear that was normal as well.  BMD 40.  Feel additional evaluation is warranted at this point.  Hysteroscopy with D&C recommended.  Unrelated, has hx of UTI.  Getting ready to go on trip and nervous about having new symptoms.  They often come on very quickly for pt.  Currently with mild symptoms but no sure if full UTI.  Procedure discussed with patient.  Recovery and pain management discussed.  Risks discussed including but not limited to bleeding, rare risk of transfusion, infection, 1% risk of uterine perforation with risks of fluid deficit causing cardiac arrythmia, cerebral swelling and/or need to stop procedure early.  Fluid emboli and rare risk of death discussed.  DVT/PE, rare risk of risk of bowel/bladder/ureteral/vascular injury.  Patient aware if pathology abnormal she may need additional treatment.  All questions answered.     Past Medical History:  Diagnosis Date   Arthritis    knees, back   Bursitis    hips - almost gone per pt    Cancer (HCC)    Carcinoid tumor of lung 2012   right found incidentally on chest xray eval for r/o vasxculitis    Carpal tunnel syndrome, bilateral    bilat but surgery to correct    Coronary artery calcification 07/23/2016   Double vision    history, no current problem   Dysrhythmia    irregular occassional, no current problem   Frequent UTI    HNP (herniated nucleus pulposus), cervical 01/22/2011   C6-C7  Has seen dr Venetia Maxon for this and Dr Shelle Iron  Positional numbness in hands no weakness     Hypertension    Hypothyroidism    Infertility, female    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  Download 08/2014:  Good compliance, breakthru apnea on 10cm.  Changed to auto 5-12cm >> good control of AHI on f/u download ONO on CPAP:       Osteoarthritis    PCOS (polycystic ovarian syndrome)    Sleep apnea    wears cpap    SUI (stress urinary incontinence, female)    h/o    MEDS:   Current Outpatient Medications on File Prior to Visit  Medication Sig Dispense Refill   aspirin EC 81 MG tablet Take 81 mg by mouth at bedtime.     atorvastatin (LIPITOR) 40 MG tablet Take 1 tablet (40 mg total) by mouth daily. 90 tablet 3   CALCIUM PO Take 1 tablet by mouth at bedtime.      chlorthalidone (HYGROTON) 25 MG tablet Take 1 tablet (25 mg total) by mouth daily. 90 tablet 3   denosumab (PROLIA) 60 MG/ML SOSY injection Inject 60 mg into the skin every 6 (six) months.     diltiazem (CARDIZEM CD) 180 MG 24 hr capsule Take 1 capsule (180 mg total) by mouth daily. 90 capsule 3   diltiazem (CARDIZEM) 30 MG tablet 1 tablet every 6 hours for palpitations 60 tablet 3   estradiol (ESTRACE VAGINAL) 0.1 MG/GM vaginal cream INSERT 1 GRAM VAGINALLY TWICE WEEKLY 42.5 g 3  gabapentin (NEURONTIN) 300 MG capsule TAKE 1 CAPSULE AT BEDTIME 90 capsule 3   GEMTESA 75 MG TABS Take 1 tablet by mouth daily as needed.     HYDROcodone bit-homatropine (HYCODAN) 5-1.5 MG/5ML syrup Take 5 mLs by mouth every 8 (eight) hours as needed for cough. 120 mL 0   metoprolol succinate (TOPROL-XL) 50 MG 24 hr tablet Take 1 tablet (50 mg total) by mouth daily. 90 tablet 3   Multiple Vitamins-Minerals (PRESERVISION AREDS 2 PO) Take 1 tablet by mouth 2 (two) times daily.     predniSONE (DELTASONE) 20 MG tablet Take 1 tablet (20 mg total) by mouth daily with breakfast. 7 tablet 0   SYNTHROID 137 MCG tablet TAKE 1 TABLET DAILY (NEED TO SCHEDULE APPOINTMENT FOR MORE REFILLS. NEED TO KEEP UPCOMING APPOINTMENT) 90 tablet 3   tirzepatide (ZEPBOUND) 12.5  MG/0.5ML Pen Inject 12.5 mg into the skin once a week. 3 mL 1   zaleplon (SONATA) 5 MG capsule Take 1 capsule (5 mg total) by mouth at bedtime as needed for sleep. 15 capsule 0   No current facility-administered medications on file prior to visit.    ALLERGIES: Neomycin and Ace inhibitors  SH:  married, non smoker  Review of Systems  Constitutional: Negative.   Respiratory:  Negative for shortness of breath.   Genitourinary:        +PMP bleeding    PHYSICAL EXAMINATION:    BP 138/76 (BP Location: Right Arm, Patient Position: Sitting, Cuff Size: Large)   Pulse 77   Wt 199 lb 12.8 oz (90.6 kg)   LMP 02/12/2011   BMI 40.35 kg/m     General appearance: alert, cooperative and appears stated age No other exam performed today   Assessment/Plan: 1. Postmenopausal bleeding - will proceed with scheduling hysteroscopy with D&C.  Risks reviewed today.  2. Recurrent UTI - Urine Culture - rx for bactrim DS BID x 5 days to pharmacy  3. BMI 40.0-44.9, adult (HCC)

## 2023-03-30 LAB — URINE CULTURE: Organism ID, Bacteria: NO GROWTH

## 2023-03-31 ENCOUNTER — Ambulatory Visit (INDEPENDENT_AMBULATORY_CARE_PROVIDER_SITE_OTHER): Payer: Medicare Other | Admitting: Adult Health

## 2023-03-31 ENCOUNTER — Encounter (INDEPENDENT_AMBULATORY_CARE_PROVIDER_SITE_OTHER): Payer: Self-pay | Admitting: Adult Health

## 2023-03-31 VITALS — BP 115/75 | HR 62 | Temp 98.2°F | Ht 59.0 in | Wt 195.0 lb

## 2023-03-31 DIAGNOSIS — R7303 Prediabetes: Secondary | ICD-10-CM | POA: Diagnosis not present

## 2023-03-31 DIAGNOSIS — I10 Essential (primary) hypertension: Secondary | ICD-10-CM | POA: Diagnosis not present

## 2023-03-31 DIAGNOSIS — Z6839 Body mass index (BMI) 39.0-39.9, adult: Secondary | ICD-10-CM

## 2023-03-31 DIAGNOSIS — E559 Vitamin D deficiency, unspecified: Secondary | ICD-10-CM

## 2023-03-31 DIAGNOSIS — R632 Polyphagia: Secondary | ICD-10-CM

## 2023-03-31 DIAGNOSIS — E66813 Obesity, class 3: Secondary | ICD-10-CM

## 2023-03-31 DIAGNOSIS — E669 Obesity, unspecified: Secondary | ICD-10-CM

## 2023-03-31 NOTE — Progress Notes (Signed)
WEIGHT SUMMARY AND BIOMETRICS  Vitals Temp: 98.2 F (36.8 C) BP: 115/75 Pulse Rate: 62 SpO2: 98 %   Anthropometric Measurements Height: 4\' 11"  (1.499 m) Weight: 195 lb (88.5 kg) BMI (Calculated): 39.36 Weight at Last Visit: 201lb Weight Lost Since Last Visit: 6lb Weight Gained Since Last Visit: 0 Starting Weight: 238lb Total Weight Loss (lbs): 43 lb (19.5 kg)   Body Composition  Body Fat %: 48.9 % Fat Mass (lbs): 95.6 lbs Muscle Mass (lbs): 95 lbs Total Body Water (lbs): 70.8 lbs Visceral Fat Rating : 16   Other Clinical Data Fasting: no Labs: no Today's Visit #: 6 Starting Date: 01/12/19    Chief Complaint:   OBESITY Nichole Smith is here to discuss her progress with her obesity treatment plan. She is on the keeping a food journal and adhering to recommended goals of 1300 calories and 90 protein and states she is following her eating plan approximately calories 70% and protein 90% % of the time. She states she is exercising Walking/Strength Training/Swimming 30 minutes 7 times per week.   Interim History:  Zepbound was increased from 10mg  to 12.5mg  on 02/18/2023- she reports tolerating increase, however reports continued sugar cravings.  She estimates to consume 1389 cal/day and 96 g protein per day since last OV on 02/18/2023. She shares that she feels that her tracking is becoming "obsessive" and she needs to step back and relax from food journaling.  Subjective:   1. Prediabetes Discussed Labs Lab Results  Component Value Date   HGBA1C 5.6 02/18/2023   HGBA1C 5.7 08/18/2022   HGBA1C 5.5 05/13/2022    Zepbound was increased from 10mg  to 12.5mg  on 02/18/2023- she reports tolerating increase, however reports continued sugar cravings. Denies mass in neck, dysphagia, dyspepsia, persistent hoarseness, abdominal pain, or N/V. She has long-standing constipation.  2. Polyphagia Zepbound was increased from 10mg  to 12.5mg  on 02/18/2023- she reports tolerating  increase, however reports continued sugar cravings. She continues to pay >$1000/month for GIP/GLP-1 therapy  3. Vitamin D deficiency Discussed Labs  Latest Reference Range & Units 02/18/23 09:13  Vitamin D, 25-Hydroxy 30.0 - 100.0 ng/mL 62.7  Ms. Stordahl tallied up all her OTC Vit D and she is on total 1800 IU QD  4. Essential hypertension Discussed Labs 02/18/2023 CMP- electrolytes and kidney fx both stable. BP excellent at OV. She denies CP with exertion.  Assessment/Plan:   1. Prediabetes Increase protein and continue regular exercise  2. Polyphagia Increase protein and continue regular exercise  3. Vitamin D deficiency Continue OTC Vit [id number] IU QD   4. Essential hypertension Continue antihypertensive regime per Cards  5. Obesity, Current BMI 39.36 Continue Zepbound 12.5mg  ocne weekly injection Do not recommend increasing dose, recommend increasing protein to help reduce sugar cravings  [name] is currently in the action stage of change. As such, her goal is to continue with weight loss efforts. She has agreed to keeping a food journal and adhering to recommended goals of 1300 calories and 90 protein.   Exercise goals: Older adults should follow the adult guidelines. When older adults cannot meet the adult guidelines, they should be as physically active as their abilities and conditions will allow.  Older adults should do exercises that maintain or improve balance if they are at risk of falling.  Older adults with chronic conditions should understand whether and how their conditions affect their ability to do regular physical activity safely.  Behavioral modification strategies: increasing lean protein intake, decreasing simple carbohydrates, increasing vegetables,  increasing water intake, meal planning and cooking strategies, and planning for success.  [name] has agreed to follow-up with our clinic in 4 weeks. She was informed of the importance of frequent follow-up visits to  maximize her success with intensive lifestyle modifications for her multiple health conditions.   Objective:   Blood pressure 115/75, pulse 62, temperature 98.2 F (36.8 C), height 4\' 11"  (1.499 m), weight 195 lb (88.5 kg), last menstrual period 02/12/2011, SpO2 98 %. Body mass index is 39.39 kg/m.  General: Cooperative, alert, well developed, in no acute distress. HEENT: Conjunctivae and lids unremarkable. Cardiovascular: Regular rhythm.  Lungs: Normal work of breathing. Neurologic: No focal deficits.   Lab Results  Component Value Date   CREATININE 0.62 02/18/2023   BUN 20 02/18/2023   NA 141 02/18/2023   K 3.7 02/18/2023   CL 98 02/18/2023   CO2 29 02/18/2023   Lab Results  Component Value Date   ALT 36 (H) 02/18/2023   AST 29 02/18/2023   ALKPHOS 71 02/18/2023   BILITOT 0.7 02/18/2023   Lab Results  Component Value Date   HGBA1C 5.6 02/18/2023   HGBA1C 5.7 08/18/2022   HGBA1C 5.5 05/13/2022   HGBA1C 5.5 10/03/2021   HGBA1C 5.3 06/25/2021   Lab Results  Component Value Date   INSULIN 13.8 02/18/2023   INSULIN 22.2 09/16/2022   INSULIN 12.2 05/13/2022   INSULIN 13.6 10/03/2021   INSULIN 12.2 06/25/2021   Lab Results  Component Value Date   TSH 2.70 08/18/2022   Lab Results  Component Value Date   CHOL 119 08/18/2022   HDL 52.70 08/18/2022   LDLCALC 57 08/18/2022   TRIG 46.0 08/18/2022   CHOLHDL 2 08/18/2022   Lab Results  Component Value Date   VD25OH 62.7 02/18/2023   VD25OH 52.5 09/16/2022   VD25OH 42.3 05/13/2022   Lab Results  Component Value Date   WBC 10.1 09/24/2022   HGB 15.4 (H) 09/24/2022   HCT 45.7 09/24/2022   MCV 92.7 09/24/2022   PLT 237.0 09/24/2022   No results found for: "IRON", "TIBC", "FERRITIN"  Attestation Statements:   Reviewed by clinician on day of visit: allergies, medications, problem list, medical history, surgical history, family history, social history, and previous encounter notes.  I have reviewed the  above documentation for accuracy and completeness, and I agree with the above. -  Koltan Portocarrero d. Barbar Brede, NP-C

## 2023-04-03 ENCOUNTER — Encounter (HOSPITAL_BASED_OUTPATIENT_CLINIC_OR_DEPARTMENT_OTHER): Payer: Self-pay

## 2023-04-03 ENCOUNTER — Telehealth: Payer: Self-pay

## 2023-04-03 NOTE — Telephone Encounter (Signed)
Called patient to discuss potential surgery dates, patient opted for 06/04.

## 2023-04-05 ENCOUNTER — Other Ambulatory Visit (HOSPITAL_BASED_OUTPATIENT_CLINIC_OR_DEPARTMENT_OTHER): Payer: Self-pay | Admitting: Obstetrics & Gynecology

## 2023-04-05 DIAGNOSIS — Z01818 Encounter for other preprocedural examination: Secondary | ICD-10-CM

## 2023-04-05 NOTE — Progress Notes (Signed)
Pre-op orders placed

## 2023-04-09 LAB — CUP PACEART REMOTE DEVICE CHECK
Date Time Interrogation Session: 20240508231018
Implantable Pulse Generator Implant Date: 20220620

## 2023-04-10 ENCOUNTER — Other Ambulatory Visit (HOSPITAL_BASED_OUTPATIENT_CLINIC_OR_DEPARTMENT_OTHER): Payer: Self-pay | Admitting: Obstetrics & Gynecology

## 2023-04-10 ENCOUNTER — Telehealth (HOSPITAL_BASED_OUTPATIENT_CLINIC_OR_DEPARTMENT_OTHER): Payer: Self-pay | Admitting: *Deleted

## 2023-04-10 NOTE — Telephone Encounter (Signed)
Informed pt that Dr. Hyacinth Meeker reached out to Dr. Elberta Fortis, cardiology, as she is on aspirin and scheduled for surgical procedure.  He felt it was fine for her to hold her aspirin for 5 days prior to procedure and she does not need cardiology clearance unless she is having SOB or chest pain. Pt denies those symptoms. Advised to let us know if she develops either of those before her procedure. Pt verbalized understanding.,

## 2023-04-13 ENCOUNTER — Ambulatory Visit (INDEPENDENT_AMBULATORY_CARE_PROVIDER_SITE_OTHER): Payer: Medicare Other

## 2023-04-13 DIAGNOSIS — I480 Paroxysmal atrial fibrillation: Secondary | ICD-10-CM

## 2023-04-15 ENCOUNTER — Encounter (HOSPITAL_BASED_OUTPATIENT_CLINIC_OR_DEPARTMENT_OTHER): Payer: Self-pay | Admitting: Obstetrics & Gynecology

## 2023-04-15 ENCOUNTER — Telehealth: Payer: Self-pay

## 2023-04-15 NOTE — Telephone Encounter (Signed)
Notified patient.  She will continue with PRN diltiazem and have [name] continue to monitor.  She has a sx activator if any further sx's develop.  She does not want to follow with AF clinic any longer and wishes to f/u with Dr. Elberta Fortis in future. Recheck appointment made for 07/21/23 with Dr. Elberta Fortis in office.

## 2023-04-15 NOTE — Telephone Encounter (Signed)
ILR alert for AF 2 new episodes, longest duration , mean HR's 154 No OAC per due to "low burden", ASA, Diltiazem,Metoprolol Route to triage for awareness LA, CVRS  Total AF events 930-1030am yesterday lasting 1 hour 2 mins of AF with RVR. Patient said she felt very dizzy and took a diltiazem which appeared to make her feel better almost immediately.  She has had no other symptoms, is still not wanting to start an OAC.  Overall burden remains 0.1%.   Forwarding to Dr. Elberta Fortis for review.

## 2023-04-16 NOTE — Progress Notes (Signed)
[facility] Summary Report / Loop Recorder 

## 2023-04-20 ENCOUNTER — Other Ambulatory Visit: Payer: Self-pay

## 2023-04-20 ENCOUNTER — Encounter (HOSPITAL_BASED_OUTPATIENT_CLINIC_OR_DEPARTMENT_OTHER): Payer: Self-pay | Admitting: Obstetrics & Gynecology

## 2023-04-20 NOTE — Progress Notes (Addendum)
Spoke w/ via phone for pre-op interview---pt Lab needs dos----  I stat  , cbc           Lab results------see below COVID test -----patient states asymptomatic no test needed Arrive at -------1230 pm 05-05-2023 NPO after MN NO Solid Food. Or liquids after midnight- Med rec completed Medications to take morning of surgery -----diltiazem, atorvastatin, metoprolol succinate, synthroid Diabetic medication -----last dose zepbound 04-27-2023 Patient instructed no nail polish to be worn day of surgery Patient instructed to bring photo id and insurance card day of surgery Patient aware to have Driver (ride ) / caregiver    for 24 hours after surgery  Patient Special Instructions -----to stop 81 mg asa 5 days before surgery per dr Hyacinth Meeker instructions Pre-Op special Instructions -----bring cpap mask tubing and machine and leave in car Patient verbalized understanding of instructions that were given at this phone interview. Patient denies shortness of breath, chest pain, fever, cough at this phone interview.  EKG 07-25-2022 epic Nuclear stress test 08-2018 epic [facility] cardiology dr Gloris [place] turner 10-20-2022 epic

## 2023-04-28 ENCOUNTER — Encounter (INDEPENDENT_AMBULATORY_CARE_PROVIDER_SITE_OTHER): Payer: Self-pay | Admitting: Adult Health

## 2023-04-28 ENCOUNTER — Ambulatory Visit (INDEPENDENT_AMBULATORY_CARE_PROVIDER_SITE_OTHER): Payer: Medicare Other | Admitting: Adult Health

## 2023-04-28 VITALS — BP 106/75 | HR 68 | Temp 98.2°F | Ht 59.0 in | Wt 195.0 lb

## 2023-04-28 DIAGNOSIS — R632 Polyphagia: Secondary | ICD-10-CM | POA: Diagnosis not present

## 2023-04-28 DIAGNOSIS — Z9181 History of falling: Secondary | ICD-10-CM

## 2023-04-28 DIAGNOSIS — E669 Obesity, unspecified: Secondary | ICD-10-CM | POA: Diagnosis not present

## 2023-04-28 DIAGNOSIS — Z6839 Body mass index (BMI) 39.0-39.9, adult: Secondary | ICD-10-CM

## 2023-04-28 DIAGNOSIS — R7303 Prediabetes: Secondary | ICD-10-CM

## 2023-04-28 MED ORDER — ZEPBOUND 12.5 MG/0.5ML ~~LOC~~ SOAJ: 12.5000 mg | SUBCUTANEOUS | 1 refills | Status: DC

## 2023-04-28 NOTE — Progress Notes (Signed)
WEIGHT SUMMARY AND BIOMETRICS  Vitals Temp: 98.2 F (36.8 C) BP: 106/75 Pulse Rate: 68 SpO2: 95 %   Anthropometric Measurements Height: 4\' 11"  (1.499 m) Weight: 195 lb (88.5 kg) BMI (Calculated): 39.36 Weight at Last Visit: 195lb Weight Lost Since Last Visit: 0 Weight Gained Since Last Visit: 0 Starting Weight: 238lb Total Weight Loss (lbs): 43 lb (19.5 kg)   Body Composition  Body Fat %: 48.8 % Fat Mass (lbs): 95.4 lbs Muscle Mass (lbs): 95 lbs Total Body Water (lbs): 72.4 lbs Visceral Fat Rating : 16   Other Clinical Data Fasting: no Labs: no Today's Visit #: 22 Starting Date: 01/12/19    Chief Complaint:   OBESITY Sandi is here to discuss her progress with her obesity treatment plan. She is on the the Category 3 Plan and states she is following her eating plan approximately 50 % of the time. She states she is exercising Walking 15 minutes 7 times per week.   Interim History:  03/31/2023 HWW OV- Converted from Journaling plan to Cat 3 to reduce stress/anxiety over hypervigilant tracking intake. She felt that Cat 3 isn't the best plan for her and wishes to resume tracking.  Discussed to track calories- 1300 max per day, at least 90 g protein per day.  Reviewed Bioempedence Results with pt: Muscle Mass: No change Adipose Mass: - 0.2 lb  Subjective:   1. History of fall 04/20/2023 she was entering her home and tipped over her feet. She feel and struck the right side of her face. Sustained abrasion to top right side her mouth. She denies loss of conscientious. She was able to stand up on her own accord. She has not sought medical care for fall. She denies change in mentation.  2. Prediabetes Lab Results  Component Value Date   HGBA1C 5.6 02/18/2023   HGBA1C 5.7 08/18/2022   HGBA1C 5.5 05/13/2022   She reports stable appetite levels, currently on Zepbound 12.5mg  every 10 days. Denies mass in neck, dysphagia, dyspepsia, persistent hoarseness,  abdominal pain, or N/V/C   3. Polyphagia Injectable hx: 11/12/2022- Dr. Dalbert Garnet replaced Trulicity 4.5mg  once week with Zepbound 10mg . 02/18/2023- Zepbound 10mg  increase to 12.5 mg- she is injecting every 10 days to preserve supply/cost savings. Due to Medicare Coverage she does not qualify for [employer]. She is currently >$1000/month for GIP/GLP-1 therapy  Denies mass in neck, dysphagia, dyspepsia, persistent hoarseness, abdominal pain, or N/V/C   Assessment/Plan:   1. History of fall Ensure home if free of trip hazards  2. Prediabetes Continue GIP/GLP-1 therapy  3. Polyphagia Increase protein intake Continue Zepound therapy.  4. Obesity, Current BMI 39.36 Refill tirzepatide (ZEPBOUND) 12.5 MG/0.5ML Pen Inject 12.5 mg into the skin once a week. Dispense: 3 mL, Refills: 1 ordered   Wave is currently in the action stage of change. As such, her goal is to continue with weight loss efforts. She has agreed to keeping a food journal and adhering to recommended goals of 1300 calories and 90+ protein.   Exercise goals: Older adults should follow the adult guidelines. When older adults cannot meet the adult guidelines, they should be as physically active as their abilities and conditions will allow.  Older adults should do exercises that maintain or improve balance if they are at risk of falling.  Older adults should determine their level of effort for physical activity relative to their level of fitness.   Behavioral modification strategies: increasing lean protein intake, decreasing simple carbohydrates, increasing vegetables,  increasing water intake, no skipping meals, meal planning and cooking strategies, planning for success, and keeping a strict food journal.  Nichole Smith has agreed to follow-up with our clinic in 6 weeks. She was informed of the importance of frequent follow-up visits to maximize her success with intensive lifestyle modifications for her multiple health  conditions.   Objective:   Blood pressure 106/75, pulse 68, temperature 98.2 F (36.8 C), height 4\' 11"  (1.499 m), weight 195 lb (88.5 kg), last menstrual period 02/12/2011, SpO2 95 %. Body mass index is 39.39 kg/m.  General: Cooperative, alert, well developed, in no acute distress. HEENT: Conjunctivae and lids unremarkable. Cardiovascular: Regular rhythm.  Lungs: Normal work of breathing. Neurologic: No focal deficits.   Lab Results  Component Value Date   CREATININE 0.62 02/18/2023   BUN 20 02/18/2023   NA 141 02/18/2023   K 3.7 02/18/2023   CL 98 02/18/2023   CO2 29 02/18/2023   Lab Results  Component Value Date   ALT 36 (H) 02/18/2023   AST 29 02/18/2023   ALKPHOS 71 02/18/2023   BILITOT 0.7 02/18/2023   Lab Results  Component Value Date   HGBA1C 5.6 02/18/2023   HGBA1C 5.7 08/18/2022   HGBA1C 5.5 05/13/2022   HGBA1C 5.5 10/03/2021   HGBA1C 5.3 06/25/2021   Lab Results  Component Value Date   INSULIN 13.8 02/18/2023   INSULIN 22.2 09/16/2022   INSULIN 12.2 05/13/2022   INSULIN 13.6 10/03/2021   INSULIN 12.2 06/25/2021   Lab Results  Component Value Date   TSH 2.70 08/18/2022   Lab Results  Component Value Date   CHOL 119 08/18/2022   HDL 52.70 08/18/2022   LDLCALC 57 08/18/2022   TRIG 46.0 08/18/2022   CHOLHDL 2 08/18/2022   Lab Results  Component Value Date   VD25OH 62.7 02/18/2023   VD25OH 52.5 09/16/2022   VD25OH 42.3 05/13/2022   Lab Results  Component Value Date   WBC 10.1 09/24/2022   HGB 15.4 (H) 09/24/2022   HCT 45.7 09/24/2022   MCV 92.7 09/24/2022   PLT 237.0 09/24/2022   No results found for: "IRON", "TIBC", "FERRITIN"  Attestation Statements:   Reviewed by clinician on day of visit: allergies, medications, problem list, medical history, surgical history, family history, social history, and previous encounter notes.  I have reviewed the above documentation for accuracy and completeness, and I agree with the above. -  Ashaz Robling  d. Colette Dicamillo, NP-C

## 2023-04-28 NOTE — Progress Notes (Unsigned)
Nichole Smith [address] [place] [address] Phone: [phone] Subjective:   Nichole Smith, am serving as a scribe for Dr. Antoine Primas.  I'm seeing this patient by the request  of:  Panosh, Neta Mends, MD  CC: Foot pain, knee pain and back pain follow-up  Nichole Smith  03/04/2023 Patient given injection and tolerated the procedure well, discussed icing regimen and home exercises, discussed which activities to do and which ones to avoid, increase activity slowly otherwise.  Follow-up with me again in 3 months.  Differential includes the possibility of lumbar radiculopathy.  Will also order an epidural with patient traveling in the near future.  See if patient needs that with the last one being 7 months ago.      Update 04/29/2023 Nichole Smith is a 67 y.o. female coming in with complaint of L hip pain. Epidural 03/20/2023. Patient states back is doing much better. Only limitation is taking the stairs leading with right leg. Right arm pain for months is getting worse. Hears popping in triceps that is associated with pain. Took a fall recently on that arm which made pain worse. No home treatment. Activity causes pain. No numbness or tingling.       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  Download 08/2014:  Good compliance, breakthru apnea on 10cm.  Changed to auto 5-12cm >> good control of AHI on f/u download ONO on CPAP:       Osteoarthritis    PCOS (polycystic ovarian  syndrome)    PMB (postmenopausal bleeding)    SUI (stress urinary incontinence, female)    h/o   Wears glasses    Past Surgical History:  Procedure Laterality Date   ANTERIOR CERVICAL DECOMP/DISCECTOMY FUSION  12/24/2017   Procedure: Cervical five-six Cervical six-seven Anterior cervical decompression/discectomy/fusion;  Surgeon: Maeola Harman, MD;  Location[facility] OR;  Service: Neurosurgery;;   CARPAL TUNNEL RELEASE Left 12/24/2017   Procedure: LEFT CARPAL TUNNEL RELEASE;  Surgeon: Maeola Harman, MD;  Location: [facility] OR;  Service: Neurosurgery;  Laterality: Left;   CESAREAN SECTION     x 1 - twins   COLONOSCOPY  2021   DILATATION & CURRETTAGE/HYSTEROSCOPY WITH RESECTOCOPE N/A 07/18/2013   Procedure: DILATATION & CURETTAGE/HYSTEROSCOPY WITH RESECTOCOPE;  Surgeon: Annamaria Boots, MD;  Location: [facility] ORS;  Service: Gynecology;  Laterality: N/A;  mass resection   implantable loop recorder placement  05/20/2021   Medtronic Reveal Linq model [id number] implantable loop recorder (SN [id number])   Lung tumor removed  2012   Rt lung carcinoid   MOUTH SURGERY     pre-cancerous ulcer removed   POLYPECTOMY     SHOULDER OPEN ROTATOR CUFF REPAIR  07/20/2010   Right   WISDOM TOOTH EXTRACTION     Social History   Socioeconomic History   Marital status: Married    Spouse name: Nichole Smith   Number of children: 2  Years of education: Not on file   Highest education level: Bachelor's degree (e.g., BA, AB, BS)  Occupational History   Occupation: retired    Associate Professor: Museum/gallery exhibitions officer  Tobacco Use   Smoking status: Never   Smokeless tobacco: Never  Vaping Use   Vaping Use: Never used  Substance and Sexual Activity   Alcohol use: Not Currently   Drug use: No   Sexual activity: Not Currently    Partners: Male    Birth control/protection: Post-menopausal  Other Topics Concern   Not on file  Social History Narrative   hhof 2    2 Children at college and  beyond      [employer]     Married    Retired age 68 2015   Recently moved taking care of grandchildren during the week.   Social Determinants of Health   Financial Resource Strain: Low Risk  (11/12/2022)   Overall Financial Resource Strain (CARDIA)    Difficulty of Paying Living Expenses: Not hard at all  Food Insecurity: No Food Insecurity (11/12/2022)   Hunger Vital Sign    Worried About Running Out of Food in the Last Year: Never true    Ran Out of Food in the Last Year: Never true  Transportation Needs: No Transportation Needs (11/12/2022)   PRAPARE - Administrator, Civil Service (Medical): No    Lack of Transportation (Non-Medical): No  Physical Activity: Insufficiently Active (11/12/2022)   Exercise Vital Sign    Days of Exercise per Week: 5 days    Minutes of Exercise per Session: 20 min  Stress: No Stress Concern Present (11/12/2022)   Nichole Smith of Occupational Health - Occupational Stress Questionnaire    Feeling of Stress : Not at all  Social Connections: Socially Integrated (11/12/2022)   Social Connection and Isolation Panel [NHANES]    Frequency of Communication with Friends and Family: More than three times a week    Frequency of Social Gatherings with Friends and Family: More than three times a week    Attends Religious Services: More than 4 times per year    Active Member of [employer] or Organizations: Yes    Attends Engineer, structural: More than 4 times per year    Marital Status: Married   Allergies  Allergen Reactions   Neomycin Other (See Comments)    Inflammation--eye drop    Ace Inhibitors Cough   Family History  Problem Relation Age of Onset   Stroke Mother        died age 7   Hypertension Mother    Sudden death Mother    Alcoholism Mother    Coronary artery disease Father        died age 26   Hypertension Father    Liver disease Father    Alcohol abuse Father    Obesity Father    Parkinson's disease Brother    Diabetes Brother     Hypertension Brother    Hypertension Maternal Uncle    Colon cancer Neg Hx    Rectal cancer Neg Hx    Stomach cancer Neg Hx    Colon polyps Neg Hx    Esophageal cancer Neg Hx     Current Outpatient Medications (Endocrine & Metabolic):    denosumab (PROLIA) 60 MG/ML SOSY injection, Inject 60 mg into the skin every 6 (six) months.   predniSONE (DELTASONE) 20 MG tablet, Take 1 tablet (20 mg total) by mouth daily with breakfast. (Patient  taking differently: Take 20 mg by mouth as needed.)   SYNTHROID 137 MCG tablet, TAKE 1 TABLET DAILY (NEED TO SCHEDULE APPOINTMENT FOR MORE REFILLS. NEED TO KEEP UPCOMING APPOINTMENT)  Current Outpatient Medications (Cardiovascular):    atorvastatin (LIPITOR) 40 MG tablet, Take 1 tablet (40 mg total) by mouth daily.   chlorthalidone (HYGROTON) 25 MG tablet, Take 1 tablet (25 mg total) by mouth daily.   diltiazem (CARDIZEM CD) 180 MG 24 hr capsule, Take 1 capsule (180 mg total) by mouth daily.   diltiazem (CARDIZEM) 30 MG tablet, 1 tablet every 6 hours for palpitations   metoprolol succinate (TOPROL-XL) 50 MG 24 hr tablet, Take 1 tablet (50 mg total) by mouth daily.  Current Outpatient Medications (Respiratory):    HYDROcodone bit-homatropine (HYCODAN) 5-1.5 MG/5ML syrup, Take 5 mLs by mouth every 8 (eight) hours as needed for cough.  Current Outpatient Medications (Analgesics):    aspirin EC 81 MG tablet, Take 81 mg by mouth at bedtime.   Current Outpatient Medications (Other):    CALCIUM PO, Take 1 tablet by mouth at bedtime.    estradiol (ESTRACE VAGINAL) 0.1 MG/GM vaginal cream, INSERT 1 GRAM VAGINALLY TWICE WEEKLY   GEMTESA 75 MG TABS, Take 1 tablet by mouth daily as needed.   Multiple Vitamins-Minerals (PRESERVISION AREDS 2 PO), Take 1 tablet by mouth 2 (two) times daily.   nitrofurantoin, macrocrystal-monohydrate, (MACROBID) 100 MG capsule, Take 100 mg by mouth daily.   tirzepatide (ZEPBOUND) 12.5 MG/0.5ML Pen, Inject 12.5 mg into the skin once a  week.   zaleplon (SONATA) 5 MG capsule, Take 1 capsule (5 mg total) by mouth at bedtime as needed for sleep.   Reviewed prior external information including notes and imaging from  primary care provider As well as notes that were available from care everywhere and other healthcare systems.  Past medical history, social, surgical and family history all reviewed in electronic medical record.  No pertanent information unless stated regarding to the chief complaint.   Review of Systems:  No headache, visual changes, nausea, vomiting, diarrhea, constipation, dizziness, abdominal pain, skin rash, fevers, chills, night sweats, weight loss, swollen lymph nodes, body aches, joint swelling, chest pain, shortness of breath, mood changes. POSITIVE muscle aches  Objective  Blood pressure 114/80, pulse 70, height 4\' 11"  (1.499 m), weight 199 lb (90.3 kg), last menstrual period 02/12/2011, SpO2 93 %.   General: No apparent distress alert and oriented x3 mood and affect normal, dressed appropriately.  HEENT: Pupils equal, extraocular movements intact  Respiratory: Patient's speak in full sentences and does not appear short of breath  Cardiovascular: No lower extremity edema, non tender, no erythema  Mild antalgic gait noted.  Does have some crepitus of the knees bilaterally.  Patient's foot exam does have some breakdown of the transverse and longitudinal arch is noted.  Seems to be left greater than right.  Right shoulder does have some crepitus noted.  Some mild loss of range of motion.  Patient does have 4-5 strength of the rotator cuff but near symmetric to the contralateral side  Limited muscular skeletal ultrasound was performed and interpreted by Antoine Primas, M  Limited ultrasound of patient's shoulder does show significant narrowing of the glenohumeral joint and the acromioclavicular joint.  Patient does have arthritic changes noted.  Degenerative changes with what appears to be even atrophy of  the supraspinatus and subscapularis. Impression: Rotator cuff arthropathy  [id number]; 15 additional minutes spent for Therapeutic exercises as stated in above notes.  This included  exercises focusing on stretching, strengthening, with significant focus on eccentric aspects.   Long term goals include an improvement in range of motion, strength, endurance as well as avoiding reinjury. Patient's frequency would include in 1-2 times a day, 3-5 times a week for a duration of 6-12 weeks. Shoulder Exercises that included:  Basic scapular stabilization to include adduction and depression of scapula Scaption, focusing on proper movement and good control Internal and External rotation utilizing a theraband, with elbow tucked at side entire time Rows with theraband   Proper technique shown and discussed handout in great detail with ATC.  All questions were discussed and answered.     Impression and Recommendations:    The above documentation has been reviewed and is accurate and complete Judi Saa, DO

## 2023-04-29 ENCOUNTER — Ambulatory Visit (INDEPENDENT_AMBULATORY_CARE_PROVIDER_SITE_OTHER): Payer: Medicare Other

## 2023-04-29 ENCOUNTER — Encounter: Payer: Self-pay | Admitting: Family Medicine

## 2023-04-29 ENCOUNTER — Other Ambulatory Visit: Payer: Self-pay

## 2023-04-29 ENCOUNTER — Ambulatory Visit (INDEPENDENT_AMBULATORY_CARE_PROVIDER_SITE_OTHER): Payer: Medicare Other | Admitting: Family Medicine

## 2023-04-29 VITALS — BP 114/80 | HR 70 | Ht 59.0 in | Wt 199.0 lb

## 2023-04-29 DIAGNOSIS — M75101 Unspecified rotator cuff tear or rupture of right shoulder, not specified as traumatic: Secondary | ICD-10-CM

## 2023-04-29 DIAGNOSIS — M12811 Other specific arthropathies, not elsewhere classified, right shoulder: Secondary | ICD-10-CM

## 2023-04-29 DIAGNOSIS — M79601 Pain in right arm: Secondary | ICD-10-CM | POA: Diagnosis not present

## 2023-04-29 NOTE — Patient Instructions (Signed)
Do prescribed exercises at least 3x a week Xrays today Have a great time on your trip See you again in 2-3 months

## 2023-04-29 NOTE — Assessment & Plan Note (Signed)
On ultrasound of the to be significant amount of arthritic changes noted.  Do feel that there is a failure of a rotator cuff surgery previously.  We discussed with patient about potentially further imaging but at the moment patient would like to try conservative therapy with home exercises.  If worsening symptoms we can consider injections.  X-rays pending.  Follow-up again in 6 to 8 weeks.  X-rays of the cervical spine as well with patient having history of anterior cervical decompression fusion.

## 2023-05-05 ENCOUNTER — Encounter (HOSPITAL_BASED_OUTPATIENT_CLINIC_OR_DEPARTMENT_OTHER): Payer: Self-pay | Admitting: Obstetrics & Gynecology

## 2023-05-05 ENCOUNTER — Other Ambulatory Visit: Payer: Self-pay

## 2023-05-05 ENCOUNTER — Ambulatory Visit (HOSPITAL_BASED_OUTPATIENT_CLINIC_OR_DEPARTMENT_OTHER)
Admission: RE | Admit: 2023-05-05 | Discharge: 2023-05-05 | Disposition: A | Discharge status: Home / Self Care | Payer: Medicare Other | Attending: Obstetrics & Gynecology | Admitting: Obstetrics & Gynecology

## 2023-05-05 ENCOUNTER — Other Ambulatory Visit (HOSPITAL_BASED_OUTPATIENT_CLINIC_OR_DEPARTMENT_OTHER): Payer: Self-pay | Admitting: Obstetrics & Gynecology

## 2023-05-05 ENCOUNTER — Encounter (HOSPITAL_BASED_OUTPATIENT_CLINIC_OR_DEPARTMENT_OTHER)
Admission: RE | Disposition: A | Discharge status: Home / Self Care | Payer: Self-pay | Source: Home / Self Care | Attending: Obstetrics & Gynecology

## 2023-05-05 ENCOUNTER — Ambulatory Visit (HOSPITAL_BASED_OUTPATIENT_CLINIC_OR_DEPARTMENT_OTHER): Payer: Medicare Other | Admitting: Certified Registered Nurse Anesthetist

## 2023-05-05 DIAGNOSIS — I10 Essential (primary) hypertension: Secondary | ICD-10-CM

## 2023-05-05 DIAGNOSIS — G4730 Sleep apnea, unspecified: Secondary | ICD-10-CM

## 2023-05-05 DIAGNOSIS — G4733 Obstructive sleep apnea (adult) (pediatric): Secondary | ICD-10-CM | POA: Diagnosis not present

## 2023-05-05 DIAGNOSIS — E282 Polycystic ovarian syndrome: Secondary | ICD-10-CM | POA: Diagnosis not present

## 2023-05-05 DIAGNOSIS — E039 Hypothyroidism, unspecified: Secondary | ICD-10-CM

## 2023-05-05 DIAGNOSIS — M1990 Unspecified osteoarthritis, unspecified site: Secondary | ICD-10-CM | POA: Diagnosis not present

## 2023-05-05 DIAGNOSIS — Z6839 Body mass index (BMI) 39.0-39.9, adult: Secondary | ICD-10-CM | POA: Diagnosis not present

## 2023-05-05 DIAGNOSIS — Z01818 Encounter for other preprocedural examination: Secondary | ICD-10-CM

## 2023-05-05 DIAGNOSIS — N950 Postmenopausal bleeding: Secondary | ICD-10-CM

## 2023-05-05 DIAGNOSIS — Z6838 Body mass index (BMI) 38.0-38.9, adult: Secondary | ICD-10-CM

## 2023-05-05 HISTORY — DX: Postmenopausal bleeding: N95.0

## 2023-05-05 HISTORY — PX: HYSTEROSCOPY WITH D & C: SHX1775

## 2023-05-05 HISTORY — DX: Presence of spectacles and contact lenses: Z97.3

## 2023-05-05 LAB — POCT I-STAT, CHEM 8
BUN: 23 mg/dL (ref 8–23)
Calcium, Ion: 1.24 mmol/L (ref 1.15–1.40)
Chloride: 100 mmol/L (ref 98–111)
Creatinine, Ser: 0.6 mg/dL (ref 0.44–1.00)
Glucose, Bld: 96 mg/dL (ref 70–99)
HCT: 47 % — ABNORMAL HIGH (ref 36.0–46.0)
Hemoglobin: 16 g/dL — ABNORMAL HIGH (ref 12.0–15.0)
Potassium: 3.6 mmol/L (ref 3.5–5.1)
Sodium: 140 mmol/L (ref 135–145)
TCO2: 34 mmol/L — ABNORMAL HIGH (ref 22–32)

## 2023-05-05 LAB — CBC
HCT: 47.1 % — ABNORMAL HIGH (ref 36.0–46.0)
Hemoglobin: 16.8 g/dL — ABNORMAL HIGH (ref 12.0–15.0)
MCH: 33.1 pg (ref 26.0–34.0)
MCHC: 35.7 g/dL (ref 30.0–36.0)
MCV: 92.9 fL (ref 80.0–100.0)
Platelets: 250 10*3/uL (ref 150–400)
RBC: 5.07 MIL/uL (ref 3.87–5.11)
RDW: 13.1 % (ref 11.5–15.5)
WBC: 9.4 10*3/uL (ref 4.0–10.5)
nRBC: 0 % (ref 0.0–0.2)

## 2023-05-05 SURGERY — DILATATION AND CURETTAGE /HYSTEROSCOPY
Anesthesia: General

## 2023-05-05 MED ORDER — LIDOCAINE-EPINEPHRINE 1 %-1:100000 IJ SOLN
INTRAMUSCULAR | Status: DC | PRN
  Administered 2023-05-05: 10 mL

## 2023-05-05 MED ORDER — OXYCODONE HCL 5 MG/5ML PO SOLN: 5.0000 mg | Freq: Once | ORAL | Status: DC | PRN

## 2023-05-05 MED ORDER — IBUPROFEN 800 MG PO TABS: 800.0000 mg | ORAL_TABLET | Freq: Three times a day (TID) | ORAL | 0 refills | Status: DC | PRN

## 2023-05-05 MED ORDER — FENTANYL CITRATE (PF) 100 MCG/2ML IJ SOLN: 25.0000 ug | INTRAMUSCULAR | Status: DC | PRN

## 2023-05-05 MED ORDER — MIDAZOLAM HCL 2 MG/2ML IJ SOLN
INTRAMUSCULAR | Status: AC
  Filled 2023-05-05: qty 2

## 2023-05-05 MED ORDER — ONDANSETRON HCL 4 MG/2ML IJ SOLN: 4.0000 mg | Freq: Four times a day (QID) | INTRAMUSCULAR | Status: DC | PRN

## 2023-05-05 MED ORDER — FENTANYL CITRATE (PF) 100 MCG/2ML IJ SOLN
INTRAMUSCULAR | Status: AC
  Filled 2023-05-05: qty 2

## 2023-05-05 MED ORDER — ONDANSETRON HCL 4 MG/2ML IJ SOLN
INTRAMUSCULAR | Status: DC | PRN
  Administered 2023-05-05: 4 mg via INTRAVENOUS

## 2023-05-05 MED ORDER — DEXAMETHASONE SODIUM PHOSPHATE 4 MG/ML IJ SOLN
INTRAMUSCULAR | Status: DC | PRN
  Administered 2023-05-05: 4 mg via INTRAVENOUS

## 2023-05-05 MED ORDER — MIDAZOLAM HCL 2 MG/2ML IJ SOLN
INTRAMUSCULAR | Status: DC | PRN
  Administered 2023-05-05: 1 mg via INTRAVENOUS

## 2023-05-05 MED ORDER — PROPOFOL 10 MG/ML IV BOLUS
INTRAVENOUS | Status: DC | PRN
  Administered 2023-05-05: 150 mg via INTRAVENOUS

## 2023-05-05 MED ORDER — FENTANYL CITRATE (PF) 100 MCG/2ML IJ SOLN
INTRAMUSCULAR | Status: DC | PRN
  Administered 2023-05-05: 50 ug via INTRAVENOUS

## 2023-05-05 MED ORDER — POVIDONE-IODINE 10 % EX SWAB: 2.0000 | Freq: Once | CUTANEOUS | Status: DC

## 2023-05-05 MED ORDER — SODIUM CHLORIDE 0.9 % IR SOLN
Status: DC | PRN
  Administered 2023-05-05: 3000 mL

## 2023-05-05 MED ORDER — LACTATED RINGERS IV SOLN: INTRAVENOUS | Status: DC

## 2023-05-05 MED ORDER — OXYCODONE HCL 5 MG PO TABS: 5.0000 mg | ORAL_TABLET | Freq: Once | ORAL | Status: DC | PRN

## 2023-05-05 MED ORDER — HYDROCODONE-ACETAMINOPHEN 5-325 MG PO TABS: 1.0000 | ORAL_TABLET | Freq: Four times a day (QID) | ORAL | 0 refills | Status: DC | PRN

## 2023-05-05 MED ORDER — KETOROLAC TROMETHAMINE 30 MG/ML IJ SOLN
INTRAMUSCULAR | Status: DC | PRN
  Administered 2023-05-05: 15 mg via INTRAVENOUS

## 2023-05-05 MED ORDER — LIDOCAINE HCL (CARDIAC) PF 100 MG/5ML IV SOSY
PREFILLED_SYRINGE | INTRAVENOUS | Status: DC | PRN
  Administered 2023-05-05: 50 mg via INTRAVENOUS

## 2023-05-05 SURGICAL SUPPLY — 20 items
CATH ROBINSON RED A/P 16FR (CATHETERS) ×1 IMPLANT
DEVICE MYOSURE LITE (MISCELLANEOUS) IMPLANT
DEVICE MYOSURE REACH (MISCELLANEOUS) IMPLANT
DILATOR CANAL MILEX (MISCELLANEOUS) IMPLANT
DRSG TELFA 3X8 NADH STRL (GAUZE/BANDAGES/DRESSINGS) ×1 IMPLANT
GAUZE 4X4 16PLY ~~LOC~~+RFID DBL (SPONGE) ×2 IMPLANT
GLOVE BIOGEL PI IND STRL 7.0 (GLOVE) ×1 IMPLANT
GLOVE ECLIPSE 6.5 STRL STRAW (GLOVE) ×2 IMPLANT
GOWN STRL REUS W/TWL LRG LVL3 (GOWN DISPOSABLE) ×2 IMPLANT
IV NS IRRIG 3000ML ARTHROMATIC (IV SOLUTION) ×1 IMPLANT
KIT PROCEDURE FLUENT (KITS) ×1 IMPLANT
KIT TURNOVER CYSTO (KITS) ×1 IMPLANT
LOOP CUTTING BIPOLAR 21FR (ELECTRODE) IMPLANT
PACK VAGINAL MINOR WOMEN LF (CUSTOM PROCEDURE TRAY) ×1 IMPLANT
PAD OB MATERNITY 4.3X12.25 (PERSONAL CARE ITEMS) ×1 IMPLANT
SEAL CERVICAL OMNI LOK (ABLATOR) IMPLANT
SEAL ROD LENS SCOPE MYOSURE (ABLATOR) ×1 IMPLANT
SLEEVE SCD COMPRESS KNEE MED (STOCKING) ×1 IMPLANT
TOWEL OR 17X24 6PK STRL BLUE (TOWEL DISPOSABLE) ×1 IMPLANT
WATER STERILE IRR 500ML POUR (IV SOLUTION) ×1 IMPLANT

## 2023-05-05 NOTE — Op Note (Signed)
05/05/2023  1:40 PM  PATIENT:  Nichole Smith  67 y.o. female  PRE-OPERATIVE DIAGNOSIS:  PMB  POST-OPERATIVE DIAGNOSIS:  PMB  PROCEDURE:  Procedure(s): DILATATION AND CURETTAGE /HYSTEROSCOPY  SURGEON:  Jerene Bears  ASSISTANTS: OR staff.   ANESTHESIA:   general  ESTIMATED BLOOD LOSS: 5 mL  BLOOD ADMINISTERED:none   FLUIDS: 700cc LR  UOP: voided before going back to OR  SPECIMEN:  endometrial curetting and ECC  DISPOSITION OF SPECIMEN:  PATHOLOGY  FINDINGS: thin endometrium  DESCRIPTION OF OPERATION: Patient was taken to the operating room.  She is placed in the supine position. SCDs were on her lower extremities and functioning properly. General anesthesia with an LMA was administered without difficulty. Dr. Chaney Malling, anesthesia, oversaw case.  Legs were then placed in the [facility] stirrups in the low lithotomy position. The legs were lifted to the high lithotomy position and the Betadine prep was used on the inner thighs perineum and vagina x3. Patient was draped in a normal standard fashion.  A bivalve speculum was placed the vagina. The anterior lip of the cervix was grasped with single-tooth tenaculum.  A paracervical block of 1% lidocaine mixed one-to-one with epinephrine (1:100,000 units).  10 cc was used total. The cervix is dilated up to #21 [facility] dilators. The endometrial cavity sounded to 7 cm.   A Omniview hysteroscope was obtained. Normal saline was used as a hysteroscopic fluid. The hysteroscope was advanced through the endocervical canal into the endometrial cavity. The tubal ostia were noted bilaterally. Additional findings included thin endometrium.  The hysteroscope was removed. A #1 toothed curette was used to curette the cavity until rough gritty texture was noted in all quadrants. Endocervical curettings were obtained as well.  At this point no other procedure was needed and this procedure was ended.  The fluid deficit was 50 cc. The tenaculum was removed from  the anterior lip of the cervix. The speculum was removed from the vagina. The prep was cleansed of the patient's skin. The legs are positioned back in the supine position. Sponge, lap, needle, instrument counts were correct x2. Patient was taken to recovery in stable condition.  COUNTS:  YES  PLAN OF CARE: Transfer to PACU

## 2023-05-05 NOTE — H&P (Signed)
Nichole Smith is an 67 y.o. female G2P3MWF with h/o recurrent PMP bleeding.  She has undergone ultrasound with endometrium noted at 3mm and endometrial biopsy that has showed benign tissue.  Pap smear was normal as well.  She is not on HRT.  BMI, however, is 40 and I felt with recurrent bleeding that additional evaluation with hysteroscopy and D&C was appropriate.  Risks, benefits and alternatives have been discussed.  She is here and ready to proceed.  Pertinent Gynecological History: Menses: post-menopausal Bleeding: post menopausal bleeding Contraception: post menopausal status DES exposure: denies Blood transfusions: none Sexually transmitted diseases: HSV Previous GYN Procedures:  hysteroscopy   Last mammogram: normal Date: 08/26/2022 Last pap: normal Date: 12/26/2022 OB History: G2, P3   Menstrual History: Patient's last menstrual period was 02/12/2011.    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  Download 08/2014:  Good compliance, breakthru apnea on 10cm.  Changed to auto 5-12cm >> good control of AHI on f/u download ONO on CPAP:       Osteoarthritis    PCOS (polycystic ovarian syndrome)    PMB (postmenopausal bleeding)    SUI (stress urinary incontinence, female)    h/o   Wears glasses     Past Surgical History:  Procedure Laterality Date   ANTERIOR CERVICAL DECOMP/DISCECTOMY FUSION  12/24/2017   Procedure: Cervical five-six Cervical six-seven Anterior cervical decompression/discectomy/fusion;   Surgeon: Maeola Harman, MD;  Location: [facility] OR;  Service: Neurosurgery;;   CARPAL TUNNEL RELEASE Left 12/24/2017   Procedure: LEFT CARPAL TUNNEL RELEASE;  Surgeon: Maeola Harman, MD;  Location: [facility] OR;  Service: Neurosurgery;  Laterality: Left;   CESAREAN SECTION     x 1 - twins   COLONOSCOPY  2021   DILATATION & CURRETTAGE/HYSTEROSCOPY WITH RESECTOCOPE N/A 07/18/2013   Procedure: DILATATION & CURETTAGE/HYSTEROSCOPY WITH RESECTOCOPE;  Surgeon: Annamaria Boots, MD;  Location: [facility] ORS;  Service: Gynecology;  Laterality: N/A;  mass resection   implantable loop recorder placement  05/20/2021   Medtronic Reveal Linq model LNQ22 implantable loop recorder (SN [id number])   Lung tumor removed  2012   Rt lung carcinoid   MOUTH SURGERY     pre-cancerous ulcer removed   POLYPECTOMY     SHOULDER OPEN ROTATOR CUFF REPAIR  07/20/2010   Right   WISDOM TOOTH EXTRACTION      Family History  Problem Relation Age of Onset   Stroke Mother        died age 27   Hypertension Mother    Sudden death Mother    Alcoholism Mother    Coronary artery disease Father        died age 52   Hypertension Father    Liver disease Father    Alcohol abuse Father    Obesity Father    Parkinson's disease Brother    Diabetes Brother    Hypertension Brother    Hypertension Maternal Uncle  Colon cancer Neg Hx    Rectal cancer Neg Hx    Stomach cancer Neg Hx    Colon polyps Neg Hx    Esophageal cancer Neg Hx     Social History:  reports that she has never smoked. She has never used smokeless tobacco. She reports that she does not currently use alcohol. She reports that she does not use drugs.  Allergies:  Allergies  Allergen Reactions   Neomycin Other (See Comments)    Inflammation--eye drop    Ace Inhibitors Cough    Medications Prior to Admission  Medication Sig Dispense Refill Last Dose   aspirin EC 81 MG tablet Take 81 mg by mouth at bedtime.   Past Week   atorvastatin (LIPITOR) 40 MG tablet Take 1  tablet (40 mg total) by mouth daily. 90 tablet 3 05/04/2023   CALCIUM PO Take 1 tablet by mouth at bedtime.    05/04/2023   chlorthalidone (HYGROTON) 25 MG tablet Take 1 tablet (25 mg total) by mouth daily. 90 tablet 3 05/04/2023   diltiazem (CARDIZEM CD) 180 MG 24 hr capsule Take 1 capsule (180 mg total) by mouth daily. 90 capsule 3 05/05/2023 at 0830   estradiol (ESTRACE VAGINAL) 0.1 MG/GM vaginal cream INSERT 1 GRAM VAGINALLY TWICE WEEKLY 42.5 g 3 Past Week   GEMTESA 75 MG TABS Take 1 tablet by mouth daily as needed.   05/04/2023   metoprolol succinate (TOPROL-XL) 50 MG 24 hr tablet Take 1 tablet (50 mg total) by mouth daily. 90 tablet 3 05/05/2023 at 0830   Multiple Vitamins-Minerals (PRESERVISION AREDS 2 PO) Take 1 tablet by mouth 2 (two) times daily.   05/04/2023   nitrofurantoin, macrocrystal-monohydrate, (MACROBID) 100 MG capsule Take 100 mg by mouth daily.   05/04/2023   SYNTHROID 137 MCG tablet TAKE 1 TABLET DAILY (NEED TO SCHEDULE APPOINTMENT FOR MORE REFILLS. NEED TO KEEP UPCOMING APPOINTMENT) 90 tablet 3 05/04/2023   zaleplon (SONATA) 5 MG capsule Take 1 capsule (5 mg total) by mouth at bedtime as needed for sleep. 15 capsule 0 Past Week   denosumab (PROLIA) 60 MG/ML SOSY injection Inject 60 mg into the skin every 6 (six) months.   More than a month   diltiazem (CARDIZEM) 30 MG tablet 1 tablet every 6 hours for palpitations 60 tablet 3 More than a month   HYDROcodone bit-homatropine (HYCODAN) 5-1.5 MG/5ML syrup Take 5 mLs by mouth every 8 (eight) hours as needed for cough. 120 mL 0 Unknown   predniSONE (DELTASONE) 20 MG tablet Take 1 tablet (20 mg total) by mouth daily with breakfast. (Patient taking differently: Take 20 mg by mouth as needed.) 7 tablet 0 Unknown   tirzepatide (ZEPBOUND) 12.5 MG/0.5ML Pen Inject 12.5 mg into the skin once a week. 3 mL 1 04/27/2023    Review of Systems  Constitutional: Negative.   Genitourinary:  Positive for menstrual problem (PMP bleeding).  All other systems  reviewed and are negative.   Blood pressure 136/69, pulse 70, temperature 98.2 F (36.8 C), temperature source Oral, resp. rate 17, height 4\' 11"  (1.499 m), weight 89.4 kg, last menstrual period 02/12/2011, SpO2 98 %. Physical Exam Constitutional:      Appearance: Normal appearance.  Cardiovascular:     Rate and Rhythm: Normal rate and regular rhythm.  Pulmonary:     Effort: Pulmonary effort is normal.     Breath sounds: Normal breath sounds.  Neurological:     General: No focal deficit present.     Mental  Status: She is alert.  Psychiatric:        Mood and Affect: Mood normal.        Behavior: Behavior normal.     Results for orders placed or performed during the hospital encounter of 05/05/23 (from the past 24 hour(s))  CBC     Status: Abnormal   Collection Time: 05/05/23 11:15 AM  Result Value Ref Range   WBC 9.4 4.0 - 10.5 K/uL   RBC 5.07 3.87 - 5.11 MIL/uL   Hemoglobin 16.8 (H) 12.0 - 15.0 g/dL   HCT 16.1 (H) 09.6 - 04.5 %   MCV 92.9 80.0 - 100.0 fL   MCH 33.1 26.0 - 34.0 pg   MCHC 35.7 30.0 - 36.0 g/dL   RDW 40.9 81.1 - 91.4 %   Platelets 250 150 - 400 K/uL   nRBC 0.0 0.0 - 0.2 %  I-STAT, chem 8     Status: Abnormal   Collection Time: 05/05/23 11:18 AM  Result Value Ref Range   Sodium 140 135 - 145 mmol/L   Potassium 3.6 3.5 - 5.1 mmol/L   Chloride 100 98 - 111 mmol/L   BUN 23 8 - 23 mg/dL   Creatinine, Ser 7.82 0.44 - 1.00 mg/dL   Glucose, Bld 96 70 - 99 mg/dL   Calcium, Ion 9.56 2.13 - 1.40 mmol/L   TCO2 34 (H) 22 - 32 mmol/L   Hemoglobin 16.0 (H) 12.0 - 15.0 g/dL   HCT 08.6 (H) 57.8 - 46.9 %    No results found.  Assessment/Plan: 67 yo G2P3 MWF with PMP bleeding, normal evaluation thus far with normal ultrasound and 3mm endometrium.  BMI 40 so additional evaluation recommended with hysteroscopy and D&C.  Pt is here and ready to proceed.  Jerene Bears 05/05/2023, 11:42 AM

## 2023-05-05 NOTE — Transfer of Care (Signed)
Immediate Anesthesia Transfer of Care Note  Patient: Nichole Smith  Procedure(s) Performed: DILATATION AND CURETTAGE /HYSTEROSCOPY  Patient Location: PACU  Anesthesia Type:General  Level of Consciousness: awake, alert , oriented, and patient cooperative  Airway & Oxygen Therapy: Patient Spontanous Breathing and Patient connected to nasal cannula oxygen  Post-op Assessment: Report given to RN and Post -op Vital signs reviewed and stable  Post vital signs: Reviewed and stable  Last Vitals:  Vitals Value Taken Time  BP    Temp    Pulse 77 05/05/23 1332  Resp 16 05/05/23 1332  SpO2 93 % 05/05/23 1332  Vitals shown include unvalidated device data.  Last Pain:  Vitals:   05/05/23 1117  TempSrc: Oral  PainSc: 3       Patients Stated Pain Goal: 2 (05/05/23 1117)  Complications: No notable events documented.

## 2023-05-05 NOTE — Discharge Instructions (Signed)
  Post Anesthesia Home Care Instructions  Activity: Get plenty of rest for the remainder of the day. A responsible individual must stay with you for 24 hours following the procedure.  For the next 24 hours, DO NOT: -Drive a car -Operate machinery -Drink alcoholic beverages -Take any medication unless instructed by your physician -Make any legal decisions or sign important papers.  Meals: Start with liquid foods such as gelatin or soup. Progress to regular foods as tolerated. Avoid greasy, spicy, heavy foods. If nausea and/or vomiting occur, drink only clear liquids until the nausea and/or vomiting subsides. Call your physician if vomiting continues.  Special Instructions/Symptoms: Your throat may feel dry or sore from the anesthesia or the breathing tube placed in your throat during surgery. If this causes discomfort, gargle with warm salt water. The discomfort should disappear within 24 hours.  D & C Home care Instructions:   Personal hygiene:  Used sanitary napkins for vaginal drainage not tampons. Shower or tub bathe the day after your procedure. No douching until bleeding stops. Always wipe from front to back after  Elimination.  Activity: Do not drive or operate any equipment today. The effects of the anesthesia are still present and drowsiness may result. Rest today, not necessarily flat bed rest, just take it easy. You may resume your normal activity in one to 2 days.  Sexual activity: No intercourse for one week or as indicated by your physician  Diet: Eat a light diet as desired this evening. You may resume a regular diet tomorrow.  Return to work: One to 2 days.  General Expectations of your surgery: Vaginal bleeding should be no heavier than a normal period. Spotting may continue up to 10 days. Mild cramps may continue for a couple of days. You may have a regular period in 2-6 weeks.  Unexpected observations call your doctor if these occur: persistent or heavy bleeding.  Severe abdominal cramping or pain. Elevation of temperature greater than 100F.  Call for an appointment in one week.    

## 2023-05-05 NOTE — Anesthesia Procedure Notes (Signed)
Procedure Name: LMA Insertion Date/Time: 05/05/2023 12:55 PM  Performed by: Earmon Phoenix, CRNAPre-anesthesia Checklist: Patient identified, Emergency Drugs available, Suction available, Patient being monitored and Timeout performed Patient Re-evaluated:Patient Re-evaluated prior to induction Oxygen Delivery Method: Circle system utilized Preoxygenation: Pre-oxygenation with 100% oxygen Induction Type: IV induction Ventilation: Mask ventilation without difficulty LMA: LMA inserted LMA Size: 4.0 Number of attempts: 1 Placement Confirmation: positive ETCO2 and breath sounds checked- equal and bilateral Tube secured with: Tape Dental Injury: Teeth and Oropharynx as per pre-operative assessment

## 2023-05-05 NOTE — Anesthesia Preprocedure Evaluation (Signed)
Anesthesia Evaluation  Patient identified by MRN, date of birth, ID band Patient awake    Reviewed: Allergy & Precautions, H&P , NPO status , Patient's Chart, lab work & pertinent test results  Airway Mallampati: II   Neck ROM: full    Dental   Pulmonary sleep apnea    breath sounds clear to auscultation       Cardiovascular hypertension,  Rhythm:regular Rate:Normal     Neuro/Psych    GI/Hepatic   Endo/Other  Hypothyroidism  PCOS  Renal/GU      Musculoskeletal  (+) Arthritis ,    Abdominal   Peds  Hematology   Anesthesia Other Findings   Reproductive/Obstetrics PMB                             Anesthesia Physical Anesthesia Plan  ASA: 2  Anesthesia Plan: General   Post-op Pain Management:    Induction: Intravenous  PONV Risk Score and Plan: 3 and Ondansetron, Dexamethasone, Treatment may vary due to age or medical condition and Midazolam  Airway Management Planned: LMA  Additional Equipment:   Intra-op Plan:   Post-operative Plan: Extubation in OR  Informed Consent: I have reviewed the patients History and Physical, chart, labs and discussed the procedure including the risks, benefits and alternatives for the proposed anesthesia with the patient or authorized representative who has indicated his/her understanding and acceptance.     Dental advisory given  Plan Discussed with: CRNA, Anesthesiologist and Surgeon  Anesthesia Plan Comments:        Anesthesia Quick Evaluation

## 2023-05-06 ENCOUNTER — Encounter (HOSPITAL_BASED_OUTPATIENT_CLINIC_OR_DEPARTMENT_OTHER): Payer: Self-pay | Admitting: Obstetrics & Gynecology

## 2023-05-06 LAB — SURGICAL PATHOLOGY

## 2023-05-06 NOTE — Anesthesia Postprocedure Evaluation (Signed)
Anesthesia Post Note  Patient: Nichole Smith  Procedure(s) Performed: DILATATION AND CURETTAGE /HYSTEROSCOPY     Patient location during evaluation: PACU Anesthesia Type: General Level of consciousness: awake and alert Pain management: pain level controlled Vital Signs Assessment: post-procedure vital signs reviewed and stable Respiratory status: spontaneous breathing, nonlabored ventilation, respiratory function stable and patient connected to nasal cannula oxygen Cardiovascular status: blood pressure returned to baseline and stable Postop Assessment: no apparent nausea or vomiting Anesthetic complications: no   No notable events documented.  Last Vitals:  Vitals:   05/05/23 1400 05/05/23 1423  BP: 136/60 136/62  Pulse: 73 65  Resp: (!) 9 17  Temp:  36.4 C  SpO2: 93% 95%    Last Pain:  Vitals:   05/06/23 1303  TempSrc:   PainSc: 0-No pain                 Dakari Stabler S

## 2023-05-07 ENCOUNTER — Encounter (HOSPITAL_BASED_OUTPATIENT_CLINIC_OR_DEPARTMENT_OTHER): Payer: Self-pay | Admitting: Pulmonary Disease

## 2023-05-07 ENCOUNTER — Ambulatory Visit (INDEPENDENT_AMBULATORY_CARE_PROVIDER_SITE_OTHER): Payer: Medicare Other | Admitting: Pulmonary Disease

## 2023-05-07 VITALS — BP 118/74 | HR 62 | Ht 59.0 in | Wt 209.4 lb

## 2023-05-07 DIAGNOSIS — G4730 Sleep apnea, unspecified: Secondary | ICD-10-CM

## 2023-05-07 DIAGNOSIS — G4733 Obstructive sleep apnea (adult) (pediatric): Secondary | ICD-10-CM

## 2023-05-07 DIAGNOSIS — E669 Obesity, unspecified: Secondary | ICD-10-CM

## 2023-05-07 DIAGNOSIS — R059 Cough, unspecified: Secondary | ICD-10-CM

## 2023-05-07 DIAGNOSIS — D751 Secondary polycythemia: Secondary | ICD-10-CM | POA: Diagnosis not present

## 2023-05-07 DIAGNOSIS — F5101 Primary insomnia: Secondary | ICD-10-CM

## 2023-05-07 MED ORDER — AZELASTINE HCL 0.1 % NA SOLN: 1.0000 | Freq: Every day | NASAL | 12 refills | Status: DC | PRN

## 2023-05-07 MED ORDER — FLUTICASONE PROPIONATE 50 MCG/ACT NA SUSP: 1.0000 | Freq: Every day | NASAL | 2 refills | Status: DC | PRN

## 2023-05-07 MED ORDER — DOXEPIN HCL 3 MG PO TABS: 3.0000 mg | ORAL_TABLET | Freq: Every evening | ORAL | 2 refills | Status: DC | PRN

## 2023-05-07 NOTE — Patient Instructions (Signed)
Doxepin 3 mg pill nightly as needed to help with sleep.  Will arrange for overnight oxygen test with CPAP.  You can try using flonase or azelastine nasal sprays to help with allergies, sinus congestion, and post nasal drip.  Follow up in 2 to 3 months.

## 2023-05-07 NOTE — Progress Notes (Signed)
Nichole Smith  Chief Complaint  Patient presents with   Follow-up    32yr f/u for OSA. States her PCP started her on a Sonata and wants to discuss. Sonata helps her to fall asleep but she is unable to stay asleep. Also has a non-productive cough that tends to last for months at the time. Tends to cough more at night.     Past Surgical History:  She  has a past surgical history that includes Cesarean section; Shoulder open rotator cuff repair (07/20/2010); Lung tumor removed (2012); Dilatation & currettage/hysteroscopy with resectoscope (N/A, 07/18/2013); Wisdom tooth extraction; Mouth surgery; Colonoscopy (2021); Anterior cervical decomp/discectomy fusion (12/24/2017); Carpal tunnel release (Left, 12/24/2017); Polypectomy; implantable loop recorder placement (05/20/2021); and Hysteroscopy with D & C (N/A, 05/05/2023).  Past Medical History:  Urine incontinence, PCOS, OA, Leukocytoclastic vasculitis, Hypothyroidism, HTN, Coronary calcification, Carcinoid tumor of lung 2012   Constitutional:  BP 118/74   Pulse 62   Ht 4\' 11"  (1.499 m)   Wt 209 lb 7 oz (95 kg)   LMP 02/12/2011   SpO2 98% Comment: on RA  BMI 42.30 kg/m   Brief Summary:  Nichole Smith is a 67 y.o. female with obstructive sleep apnea.      Subjective:   I last saw her in 2020.  She was most recently seen by [facility].  She uses CPAP nightly.  Feels like pressure is too high sometimes.  This started after she lost weight.  No issues with mask fit.    She has trouble staying asleep.  She goes to sleep at a consistent time.  She sleep for about 1 to 2 hours.  Then she wakes up to use the bathroom.  She also has trouble finding a comfortable sleep position due to joint pains.  She can usually fall back to sleep, but sometimes can take an hour.  She feels more tired during the day when this happens.  She gets intermittent episodes of coughing.  This happens frequently when she  gets a viral respiratory infection.  She gets allergies during certain seasons, and can get a cough then.  She tried OTC nexium and this seemed to help.  She isn't having a cough at present.  Chest xray from 08/18/22 was normal.  She is concerned that her most recent CBC showed her hemoglobin was 16.8.  she was seen by hematology years ago for erythrocytosis.  At the time JAK2 mutation and erythropoietin level were negative.  Physical Exam:   Appearance - well kempt   ENMT - no sinus tenderness, no oral exudate, no LAN, Mallampati 3 airway, no stridor  Respiratory - equal breath sounds bilaterally, no wheezing or rales  CV - s1s2 regular rate and rhythm, no murmurs  Ext - no clubbing, no edema  Skin - no rashes  Psych - normal mood and affect   Pulmonary testing:    Chest Imaging:    Sleep Tests:  PSG 07/10/14 >> AHI 85 ONO with CPAP 04/12/18 >> test time 6 hrs 34 min.  Average SpO2 90%, low SpO2 82%.  Spent 43.5 min with SpO2 < 88%. ONO with CPAP 06/08/18 >> test time 8 hrs 42 min.  Basal SpO2 93.6%, low SpO2 81%.  Spent 4.3 min with SpO2 < 88%. CPAP 02/04/23 to 05/04/23 >> used on 90 of 90 nights with average 7 hrs 54 min.  Average AHI 0.7 with CPAP 16 cm H2O  Cardiac Tests:  Echo 12/13/18 >>  EF 60 to 65%, grade 1 DD  Social History:  She  reports that she has never smoked. She has never used smokeless tobacco. She reports that she does not currently use alcohol. She reports that she does not use drugs.  Family History:  Her family history includes Alcohol abuse in her father; Alcoholism in her mother; Coronary artery disease in her father; Diabetes in her brother; Hypertension in her brother, father, maternal uncle, and mother; Liver disease in her father; Obesity in her father; Parkinson's disease in her brother; Stroke in her mother; Sudden death in her mother.     Assessment/Plan:   Obstructive sleep apnea. - she is compliant with CPAP and reports benefit from  therapy - she uses [employer] for her DME - current CPAP ordered January 2022 - continue CPAP 16 cm H2O for now - if her overnight oximetry is normal, will then see if she can tolerate having her CPAP decreased to 13 cm H2O - explained her BMI needs to be less than 35 to be a candidate for an hypoglossal nerve stimulator  Sleep maintenance insomnia. - reviewed proper sleep hygiene, normal sleep patterns with aging, and setting realistic expectations from sleep - discussed stimulus control, sleep restriction, and relaxation techniques - zaleplon wasn't effective and benadryl caused a hangover effect - will have her try doxepin 3 mg nightly prn  - some of her sleep difficulties are related to pain symptoms  Cough. - intermittent and not currently an issue - no history of smoking and most recent chest xray was normal - likely gets asthmatic bronchitis related to respiratory infections, post nasal drip from seasonal allergies, and intermittent reflux - she will call if her cough recurs, and then determine if additional assessment is needed  Erythrocytosis. - previously seen by Dr. Artis Delay with hematology - will arrange for overnight oximetry with CPAP to determine if she might need supplemental oxygen at night or adjustment to her CPAP settings  Obesity. - followed by [facility]  Coronary calcification, Hypertension, Paroxysmal atrial fibrillation. - followed by Dr. Armanda Magic and Dr. Loman Brooklyn with cardiology  Leukocytoclastic vasculitis. - followed by [facility]   History of carcinoid tumor. - she gets yearly chest xray  Time Spent Involved in Patient Care on Day of Examination:  49 minutes  Follow up:   Patient Instructions  Doxepin 3 mg pill nightly as needed to help with sleep.  Will arrange for overnight oxygen test with CPAP.  You can try using flonase or azelastine nasal sprays to help with allergies, sinus congestion, and post  nasal drip.  Follow up in 2 to 3 months.  Medication List:   Allergies as of 05/07/2023       Reactions   Neomycin Other (See Comments)   Inflammation--eye drop   Ace Inhibitors Cough        Medication List        Accurate as of May 07, 2023 10:22 AM. If you have any questions, ask your nurse or doctor.          STOP taking these medications    HYDROcodone-acetaminophen 5-325 MG tablet Commonly known as: NORCO/VICODIN Stopped by: Coralyn Helling, MD   zaleplon 5 MG capsule Commonly known as: SONATA Stopped by: Coralyn Helling, MD       TAKE these medications    aspirin EC 81 MG tablet Take 81 mg by mouth at bedtime.   atorvastatin 40 MG tablet Commonly known as: LIPITOR Take  1 tablet (40 mg total) by mouth daily.   azelastine 0.1 % nasal spray Commonly known as: ASTELIN Place 1 spray into both nostrils daily as needed for rhinitis. Use in each nostril as directed Started by: Coralyn Helling, MD   CALCIUM PO Take 1 tablet by mouth at bedtime.   chlorthalidone 25 MG tablet Commonly known as: HYGROTON Take 1 tablet (25 mg total) by mouth daily.   denosumab 60 MG/ML Sosy injection Commonly known as: PROLIA Inject 60 mg into the skin every 6 (six) months.   diltiazem 180 MG 24 hr capsule Commonly known as: CARDIZEM CD Take 1 capsule (180 mg total) by mouth daily.   diltiazem 30 MG tablet Commonly known as: Cardizem 1 tablet every 6 hours for palpitations   Doxepin HCl 3 MG Tabs Take 1 tablet (3 mg total) by mouth at bedtime as needed. Started by: Coralyn Helling, MD   estradiol 0.1 MG/GM vaginal cream Commonly known as: ESTRACE VAGINAL INSERT 1 GRAM VAGINALLY TWICE WEEKLY   fluticasone 50 MCG/ACT nasal spray Commonly known as: FLONASE Place 1 spray into both nostrils daily as needed for allergies or rhinitis. Started by: Coralyn Helling, MD   Gemtesa 75 MG Tabs Generic drug: Vibegron Take 1 tablet by mouth daily as needed.   HYDROcodone bit-homatropine  5-1.5 MG/5ML syrup Commonly known as: HYCODAN Take 5 mLs by mouth every 8 (eight) hours as needed for cough.   ibuprofen 800 MG tablet Commonly known as: ADVIL Take 1 tablet (800 mg total) by mouth every 8 (eight) hours as needed.   metoprolol succinate 50 MG 24 hr tablet Commonly known as: TOPROL-XL Take 1 tablet (50 mg total) by mouth daily.   nitrofurantoin (macrocrystal-monohydrate) 100 MG capsule Commonly known as: MACROBID Take 100 mg by mouth daily.   predniSONE 20 MG tablet Commonly known as: DELTASONE Take 1 tablet (20 mg total) by mouth daily with breakfast. What changed:  when to take this reasons to take this   PRESERVISION AREDS 2 PO Take 1 tablet by mouth 2 (two) times daily.   Synthroid 137 MCG tablet Generic drug: levothyroxine TAKE 1 TABLET DAILY (NEED TO SCHEDULE APPOINTMENT FOR MORE REFILLS. NEED TO KEEP UPCOMING APPOINTMENT)   Zepbound 12.5 MG/0.5ML Pen Generic drug: tirzepatide Inject 12.5 mg into the skin once a week.        Signature:  Coralyn Helling, MD [facility] Pulmonary/Critical Care Pager - [phone] 05/07/2023, 10:22 AM

## 2023-05-11 ENCOUNTER — Encounter (HOSPITAL_BASED_OUTPATIENT_CLINIC_OR_DEPARTMENT_OTHER): Payer: Self-pay | Admitting: Pulmonary Disease

## 2023-05-11 NOTE — Progress Notes (Signed)
[facility] Summary Report / Loop Recorder 

## 2023-05-13 LAB — CUP PACEART REMOTE DEVICE CHECK
Date Time Interrogation Session: 20240610230604
Implantable Pulse Generator Implant Date: 20220620

## 2023-05-18 ENCOUNTER — Ambulatory Visit (INDEPENDENT_AMBULATORY_CARE_PROVIDER_SITE_OTHER): Payer: Medicare Other

## 2023-05-18 DIAGNOSIS — I480 Paroxysmal atrial fibrillation: Secondary | ICD-10-CM | POA: Diagnosis not present

## 2023-05-22 ENCOUNTER — Encounter (INDEPENDENT_AMBULATORY_CARE_PROVIDER_SITE_OTHER): Payer: Self-pay | Admitting: Adult Health

## 2023-06-01 ENCOUNTER — Encounter (HOSPITAL_BASED_OUTPATIENT_CLINIC_OR_DEPARTMENT_OTHER): Payer: Self-pay | Admitting: Obstetrics & Gynecology

## 2023-06-01 ENCOUNTER — Ambulatory Visit (INDEPENDENT_AMBULATORY_CARE_PROVIDER_SITE_OTHER): Payer: Medicare Other | Admitting: Obstetrics & Gynecology

## 2023-06-01 VITALS — BP 120/56 | HR 70 | Ht 59.0 in | Wt 199.2 lb

## 2023-06-01 DIAGNOSIS — N950 Postmenopausal bleeding: Secondary | ICD-10-CM

## 2023-06-01 NOTE — Progress Notes (Signed)
GYNECOLOGY  VISIT  CC:   post op recheck  HPI: 67 y.o. G2P2 Married White or Caucasian female here for recheck after undergoing hysteroscopy and D&C on 05/05/2023.  Photos from the procedure and pathology reviewed.  Had some spotting for a few days post op but this has stopped.  Feels fine.  Has a few other, non related questions today.  Discussed possible PCPs in the future if/when Dr. Pollyann Kennedy.  Denies pain.  MEDS:   Current Outpatient Medications on File Prior to Visit  Medication Sig Dispense Refill   aspirin EC 81 MG tablet Take 81 mg by mouth at bedtime.     atorvastatin (LIPITOR) 40 MG tablet Take 1 tablet (40 mg total) by mouth daily. 90 tablet 3   azelastine (ASTELIN) 0.1 % nasal spray Place 1 spray into both nostrils daily as needed for rhinitis. Use in each nostril as directed 30 mL 12   CALCIUM PO Take 1 tablet by mouth at bedtime.      chlorthalidone (HYGROTON) 25 MG tablet Take 1 tablet (25 mg total) by mouth daily. 90 tablet 3   denosumab (PROLIA) 60 MG/ML SOSY injection Inject 60 mg into the skin every 6 (six) months.     diltiazem (CARDIZEM CD) 180 MG 24 hr capsule Take 1 capsule (180 mg total) by mouth daily. 90 capsule 3   diltiazem (CARDIZEM) 30 MG tablet 1 tablet every 6 hours for palpitations 60 tablet 3   Doxepin HCl 3 MG TABS Take 1 tablet (3 mg total) by mouth at bedtime as needed. 30 tablet 2   estradiol (ESTRACE VAGINAL) 0.1 MG/GM vaginal cream INSERT 1 GRAM VAGINALLY TWICE WEEKLY 42.5 g 3   fluticasone (FLONASE) 50 MCG/ACT nasal spray Place 1 spray into both nostrils daily as needed for allergies or rhinitis. 16 g 2   GEMTESA 75 MG TABS Take 1 tablet by mouth daily as needed.     HYDROcodone bit-homatropine (HYCODAN) 5-1.5 MG/5ML syrup Take 5 mLs by mouth every 8 (eight) hours as needed for cough. 120 mL 0   ibuprofen (ADVIL) 800 MG tablet Take 1 tablet (800 mg total) by mouth every 8 (eight) hours as needed. 30 tablet 0   metoprolol succinate (TOPROL-XL) 50  MG 24 hr tablet Take 1 tablet (50 mg total) by mouth daily. 90 tablet 3   Multiple Vitamins-Minerals (PRESERVISION AREDS 2 PO) Take 1 tablet by mouth 2 (two) times daily.     nitrofurantoin, macrocrystal-monohydrate, (MACROBID) 100 MG capsule Take 100 mg by mouth daily.     predniSONE (DELTASONE) 20 MG tablet Take 1 tablet (20 mg total) by mouth daily with breakfast. (Patient taking differently: Take 20 mg by mouth as needed.) 7 tablet 0   SYNTHROID 137 MCG tablet TAKE 1 TABLET DAILY (NEED TO SCHEDULE APPOINTMENT FOR MORE REFILLS. NEED TO KEEP UPCOMING APPOINTMENT) 90 tablet 3   tirzepatide (ZEPBOUND) 12.5 MG/0.5ML Pen Inject 12.5 mg into the skin once a week. 3 mL 1   No current facility-administered medications on file prior to visit.    SH:  Smoking No    PHYSICAL EXAMINATION:    BP (!) 120/56 (BP Location: Right Arm, Patient Position: Sitting, Cuff Size: Large)   Pulse 70   Ht 4\' 11"  (1.499 m) Comment: Reported  Wt 199 lb 3.2 oz (90.4 kg)   LMP 02/12/2011   BMI 40.23 kg/m     Physical Exam Constitutional:      Appearance: Normal appearance.  Neurological:  General: No focal deficit present.     Mental Status: She is alert.  Psychiatric:        Mood and Affect: Mood normal.        Behavior: Behavior normal.    Assessment/Plan: 1. Postmenopausal bleeding - pathology with hysteroscopy and D&C were negative.  Advised pt to call if has any future bleeding.

## 2023-06-02 NOTE — Progress Notes (Unsigned)
[facility] [address] [place] [address] Phone: [phone] Subjective:   Nichole Smith, am serving as a scribe for Dr. Antoine Smith.  I'm seeing this patient by the request  of:  Smith, Nichole Mends, MD  CC: Bilateral knee pain, low back pain, recent falls  [id number]  04/29/2023 On ultrasound of the to be significant amount of arthritic changes noted.  Do feel that there is a failure of a rotator cuff surgery previously.  We discussed with patient about potentially further imaging but at the moment patient would like to try conservative therapy with home exercises.  If worsening symptoms we can consider injections.  X-rays pending.  Follow-up again in 6 to 8 weeks.  X-rays of the cervical spine as well with patient having history of anterior cervical decompression fusion.      Update 06/03/2023 [name] is a 67 y.o. female coming in with complaint of R shoulder pain. Last epidural in April 2024. Hysterectomy on 05/05/2023. Patient states that she is dealing with her shoulder pain. Today wants to discuss issues with falling recently. Patient was walking on [place] and she was on flat surface and just fell. No dizziness or balance issue. Noticed that she drags her feet when she walks. Wearing HOKAs to walk but wants to know if shoes are causing her falls.   Back feels good in morning but has increase in pain as the day goes on.  Patient does not know if the recent falls or even the tripping on the legs has anything to do with her back or not.  Has noticed though that her back does feel worse after doing this on a more regular basis.  Seems to get better with rest.  Continues to try to stay active so she can continue to work on weight loss.    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  Download 08/2014:  Good compliance, breakthru apnea on 10cm.  Changed to auto 5-12cm >> good control of AHI on f/u download ONO on CPAP:       Osteoarthritis    PCOS (polycystic ovarian syndrome)    PMB (postmenopausal bleeding)    SUI (stress urinary incontinence, female)    h/o   Wears glasses    Past Surgical History:  Procedure Laterality Date   ANTERIOR CERVICAL DECOMP/DISCECTOMY FUSION  12/24/2017   Procedure: Cervical five-six Cervical six-seven Anterior cervical decompression/discectomy/fusion;  Surgeon: Nichole Harman, MD;  Location: [facility] OR;  Service: Neurosurgery;;   CARPAL TUNNEL RELEASE Left 12/24/2017   Procedure: LEFT CARPAL TUNNEL RELEASE;  Surgeon: Nichole Harman, MD;  Location: [facility] OR;  Service: Neurosurgery;  Laterality: Left;   CESAREAN SECTION     x 1 - twins   COLONOSCOPY  2021   DILATATION & CURRETTAGE/HYSTEROSCOPY WITH RESECTOCOPE N/A 07/18/2013   Procedure: DILATATION & CURETTAGE/HYSTEROSCOPY WITH RESECTOCOPE;  Surgeon: Nichole Boots,  MD;  Location: [facility];  Service: Gynecology;  Laterality: N/A;  mass resection   HYSTEROSCOPY WITH D & C N/A 05/05/2023   Procedure: DILATATION AND CURETTAGE /HYSTEROSCOPY;  Surgeon: Nichole Bears, MD;  Location: [facility];  Service: Gynecology;  Laterality: N/A;   implantable loop recorder placement  05/20/2021   Medtronic Reveal Linq model [id number] implantable loop recorder (SN [id number])   Lung tumor removed  2012   Rt lung carcinoid   MOUTH SURGERY     pre-cancerous ulcer removed   POLYPECTOMY     SHOULDER OPEN ROTATOR CUFF REPAIR  07/20/2010   Right   WISDOM TOOTH EXTRACTION      Social History   Socioeconomic History   Marital status: Married    Spouse name: Nichole Smith   Number of children: 2   Years of education: Not on file   Highest education level: Bachelor's degree (e.g., BA, AB, BS)  Occupational History   Occupation: retired    Associate Professor: Museum/gallery exhibitions officer  Tobacco Use   Smoking status: Never   Smokeless tobacco: Never  Vaping Use   Vaping Use: Never used  Substance and Sexual Activity   Alcohol use: Not Currently   Drug use: No   Sexual activity: Not Currently    Partners: Male    Birth control/protection: Post-menopausal  Other Topics Concern   Not on file  Social History Narrative   hhof 2    2 Children at college and  beyond      Nichole Smith    Married    Retired age 11 2015   Recently moved taking care of grandchildren during the week.   Social Determinants of Health   Financial Resource Strain: Low Risk  (11/12/2022)   Overall Financial Resource Strain (CARDIA)    Difficulty of Paying Living Expenses: Not hard at all  Food Insecurity: No Food Insecurity (11/12/2022)   Hunger Vital Sign    Worried About Running Out of Food in the Last Year: Never true    Ran Out of Food in the Last Year: Never true  Transportation Needs: No Transportation Needs (11/12/2022)   PRAPARE - Administrator, Civil Service (Medical): No    Lack of Transportation (Non-Medical): No  Physical Activity: Insufficiently Active (11/12/2022)   Exercise Vital Sign    Days of Exercise per Week: 5 days    Minutes of Exercise per Session: 20 min  Stress: No Stress Concern Present (11/12/2022)   N[facility]ole [facility] [employer] - Occupational Stress Questionnaire    Feeling of Stress : Not at all  Social Connections: Socially Integrated (11/12/2022)   Social Connection and Isolation Panel [NHANES]    Frequency of Communication with Friends and Family: More than three times a week    Frequency of Social Gatherings with Friends  and Family: More than three times a week    Attends Religious Services: More than 4 times per year    Active Member of [employer] or Organizations: Yes    Attends Engineer, structural: More than 4 times per year    Marital Status: Married   Allergies  Allergen Reactions   Neomycin Other (See Comments)    Inflammation--eye drop    Ace Inhibitors Cough   Family History  Problem Relation Age of Onset   Stroke Mother        died age 42   Hypertension Mother    Sudden death Mother    Alcoholism Mother  Coronary artery disease Father        died age 54   Hypertension Father    Liver disease Father    Alcohol abuse Father    Obesity Father    Parkinson's disease Brother    Diabetes Brother    Hypertension Brother    Hypertension Maternal Uncle    Colon cancer Neg Hx    Rectal cancer Neg Hx    Stomach cancer Neg Hx    Colon polyps Neg Hx    Esophageal cancer Neg Hx     Current Outpatient Medications (Endocrine & Metabolic):    denosumab (PROLIA) 60 MG/ML SOSY injection, Inject 60 mg into the skin every 6 (six) months.   predniSONE (DELTASONE) 20 MG tablet, Take 1 tablet (20 mg total) by mouth daily with breakfast. (Patient taking differently: Take 20 mg by mouth as needed.)   SYNTHROID 137 MCG tablet, TAKE 1 TABLET DAILY (NEED TO SCHEDULE APPOINTMENT FOR MORE REFILLS. NEED TO KEEP UPCOMING APPOINTMENT)  Current Outpatient Medications (Cardiovascular):    atorvastatin (LIPITOR) 40 MG tablet, Take 1 tablet (40 mg total) by mouth daily.   chlorthalidone (HYGROTON) 25 MG tablet, Take 1 tablet (25 mg total) by mouth daily.   diltiazem (CARDIZEM CD) 180 MG 24 hr capsule, Take 1 capsule (180 mg total) by mouth daily.   diltiazem (CARDIZEM) 30 MG tablet, 1 tablet every 6 hours for palpitations   metoprolol succinate (TOPROL-XL) 50 MG 24 hr tablet, Take 1 tablet (50 mg total) by mouth daily.  Current Outpatient Medications (Respiratory):    azelastine (ASTELIN) 0.1 % nasal  spray, Place 1 spray into both nostrils daily as needed for rhinitis. Use in each nostril as directed   fluticasone (FLONASE) 50 MCG/ACT nasal spray, Place 1 spray into both nostrils daily as needed for allergies or rhinitis.   HYDROcodone bit-homatropine (HYCODAN) 5-1.5 MG/5ML syrup, Take 5 mLs by mouth every 8 (eight) hours as needed for cough.  Current Outpatient Medications (Analgesics):    aspirin EC 81 MG tablet, Take 81 mg by mouth at bedtime.   ibuprofen (ADVIL) 800 MG tablet, Take 1 tablet (800 mg total) by mouth every 8 (eight) hours as needed.   Current Outpatient Medications (Other):    CALCIUM PO, Take 1 tablet by mouth at bedtime.    Doxepin HCl 3 MG TABS, Take 1 tablet (3 mg total) by mouth at bedtime as needed.   estradiol (ESTRACE VAGINAL) 0.1 MG/GM vaginal cream, INSERT 1 GRAM VAGINALLY TWICE WEEKLY   GEMTESA 75 MG TABS, Take 1 tablet by mouth daily as needed.   Multiple Vitamins-Minerals (PRESERVISION AREDS 2 PO), Take 1 tablet by mouth 2 (two) times daily.   tirzepatide (ZEPBOUND) 12.5 MG/0.5ML Pen, Inject 12.5 mg into the skin once a week.   Reviewed prior external information including notes and imaging from  primary care provider As well as notes that were available from care everywhere and other healthcare systems.  Past medical history, social, surgical and family history all reviewed in electronic medical record.  No pertanent information unless stated regarding to the chief complaint.   Review of Systems:  No headache, visual changes, nausea, vomiting, diarrhea, constipation, dizziness, abdominal pain, skin rash, fevers, chills, night sweats, weight loss, swollen lymph nodes, body aches, joint swelling, chest pain, shortness of breath, mood changes. POSITIVE muscle aches  Objective  Blood pressure 122/80, pulse 66, height 4\' 11"  (1.499 m), weight 198 lb (89.8 kg), last menstrual period 02/12/2011, SpO2 98 %.  General: No apparent distress alert and oriented  x3 mood and affect normal, dressed appropriately.  HEENT: Pupils equal, extraocular movements intact  Respiratory: Patient's speak in full sentences and does not appear short of breath  Cardiovascular: No lower extremity edema, non tender, no erythema  Patient's low back does have some loss lordosis noted.  Tightness with straight leg test noted.  Some mild weakness with 4 out of 5 strength of dorsiflexion of the foot bilaterally.  Deep tendon reflexes are intact.    Impression and Recommendations:      The above documentation has been reviewed and is accurate and complete Judi Saa, DO

## 2023-06-03 ENCOUNTER — Encounter (HOSPITAL_BASED_OUTPATIENT_CLINIC_OR_DEPARTMENT_OTHER): Payer: Self-pay | Admitting: Obstetrics & Gynecology

## 2023-06-03 ENCOUNTER — Other Ambulatory Visit: Payer: Self-pay

## 2023-06-03 ENCOUNTER — Ambulatory Visit (INDEPENDENT_AMBULATORY_CARE_PROVIDER_SITE_OTHER): Payer: Medicare Other | Admitting: Family Medicine

## 2023-06-03 VITALS — BP 122/80 | HR 66 | Ht 59.0 in | Wt 198.0 lb

## 2023-06-03 DIAGNOSIS — M5416 Radiculopathy, lumbar region: Secondary | ICD-10-CM

## 2023-06-03 DIAGNOSIS — M25511 Pain in right shoulder: Secondary | ICD-10-CM

## 2023-06-03 NOTE — Assessment & Plan Note (Signed)
Concern is more secondary to the radicular symptoms.  Think it is secondary to the spinal stenosis that has been seen on MRI previously.  Has responded to epidurals previously but now having some weakness.  Will try an epidural again to see how patient responds.  Having more of a foot drop send will try the L5-S1 area.  Depending on findings we would need to suggest the possibility of nerve conduction studies to keep having some difficulty.  Follow-up with me again in 2 to 3 months after the injection

## 2023-06-03 NOTE — Patient Instructions (Addendum)
Epidural [phone] Heel lift consider at toe Hip ab strength See me again in 2-3 months

## 2023-06-08 ENCOUNTER — Encounter (INDEPENDENT_AMBULATORY_CARE_PROVIDER_SITE_OTHER): Payer: Self-pay | Admitting: Adult Health

## 2023-06-08 ENCOUNTER — Encounter (HOSPITAL_BASED_OUTPATIENT_CLINIC_OR_DEPARTMENT_OTHER): Payer: Self-pay | Admitting: Obstetrics & Gynecology

## 2023-06-08 ENCOUNTER — Other Ambulatory Visit (HOSPITAL_BASED_OUTPATIENT_CLINIC_OR_DEPARTMENT_OTHER): Payer: Self-pay | Admitting: Obstetrics & Gynecology

## 2023-06-08 ENCOUNTER — Ambulatory Visit (INDEPENDENT_AMBULATORY_CARE_PROVIDER_SITE_OTHER): Payer: Medicare Other | Admitting: Adult Health

## 2023-06-08 VITALS — BP 118/74 | HR 68 | Temp 98.0°F | Ht 59.0 in | Wt 194.0 lb

## 2023-06-08 DIAGNOSIS — I10 Essential (primary) hypertension: Secondary | ICD-10-CM | POA: Diagnosis not present

## 2023-06-08 DIAGNOSIS — E669 Obesity, unspecified: Secondary | ICD-10-CM | POA: Diagnosis not present

## 2023-06-08 DIAGNOSIS — R632 Polyphagia: Secondary | ICD-10-CM

## 2023-06-08 DIAGNOSIS — R7303 Prediabetes: Secondary | ICD-10-CM | POA: Diagnosis not present

## 2023-06-08 DIAGNOSIS — N390 Urinary tract infection, site not specified: Secondary | ICD-10-CM

## 2023-06-08 DIAGNOSIS — Z6839 Body mass index (BMI) 39.0-39.9, adult: Secondary | ICD-10-CM

## 2023-06-08 NOTE — Progress Notes (Signed)
WEIGHT SUMMARY AND BIOMETRICS  Vitals Temp: 98 F (36.7 C) BP: 118/74 Pulse Rate: 68 SpO2: 94 %   Anthropometric Measurements Height: 4\' 11"  (1.499 m) Weight: 194 lb (88 kg) BMI (Calculated): 39.16 Weight at Last Visit: 195lb Weight Lost Since Last Visit: 1lb Weight Gained Since Last Visit: 0 Starting Weight: 238lb Total Weight Loss (lbs): 44 lb (20 kg)   Body Composition  Body Fat %: 47.3 % Fat Mass (lbs): 92 lbs Muscle Mass (lbs): 97.4 lbs Total Body Water (lbs): 72.6 lbs Visceral Fat Rating : 16   Other Clinical Data Fasting: No Labs: No Today's Visit #: 60 Starting Date: 01/12/19    Chief Complaint:   OBESITY Nichole Smith is here to discuss her progress with her obesity treatment plan. She is on the keeping a food journal and adhering to recommended goals of 1300 calories and 90 protein and states she is following her eating plan approximately 60 % of the time. She states she is exercising Walking/Swimming/Strength Training 15 minutes 7 times per week.   Interim History:  Injectable Hx: Trulicity 4.5mg  replaced with Zepbound 10mg  on 11/12/2022 Zepbound 10mg  replaced with Zebpund 12.5mg  on 02/18/2023 Denies mass in neck, dysphagia, dyspepsia, persistent hoarseness, abdominal pain, or N/V.  Hunger/appetite-she endorses increased appetite in late afternoon/evening.  She is wondering about converting from Zepbound therapy to [facility] therapy. She has HTN, HLD, Afib, and her mother suffered CVA.  Subjective:   1. Essential hypertension BP excellent at OV She is currently on: diltiazem (CARDIZEM) 30 MG tablet  diltiazem (CARDIZEM CD) 180 MG 24 hr capsule  atorvastatin (LIPITOR) 40 MG tablet  chlorthalidone (HYGROTON) 25 MG tablet  metoprolol succinate (TOPROL-XL) 50 MG 24 hr tablet   She is wondering about converting from Zepbound therapy to [facility] therapy. She has HTN, HLD, Afib, and her mother suffered CVA.  2. Polyphagia Injectable Hx: Trulicity  4.5mg  replaced with Zepbound 10mg  on 11/12/2022 Zepbound 10mg  replaced with Zebpund 12.5mg  on 02/18/2023  She is wondering about converting from Zepbound therapy to [facility] therapy. She has HTN, HLD, Afib, and her mother suffered CVA.  3. Prediabetes Lab Results  Component Value Date   HGBA1C 5.6 02/18/2023   HGBA1C 5.7 08/18/2022   HGBA1C 5.5 05/13/2022   Injectable Hx: Trulicity 4.5mg  replaced with Zepbound 10mg  on 11/12/2022 Zepbound 10mg  replaced with Zebpund 12.5mg  on 02/18/2023 Denies mass in neck, dysphagia, dyspepsia, persistent hoarseness, abdominal pain, or N/V. She denies worsening constipation.  Assessment/Plan:   1. Essential hypertension Continue diltiazem (CARDIZEM) 30 MG tablet  diltiazem (CARDIZEM CD) 180 MG 24 hr capsule  atorvastatin (LIPITOR) 40 MG tablet  chlorthalidone (HYGROTON) 25 MG tablet  metoprolol succinate (TOPROL-XL) 50 MG 24 hr tablet   Limit Na+ intake  2. Polyphagia Continue Zepbound 12.5mg  injection every 10 days  3. Prediabetes Continue Zepbound 12.5mg  inkection every 10 days  4. Obesity, Current BMI 39.16 Consider increasing Zepbound therapy at next OV. Would need to stop Zepbound therapy to start [facility] therapy, unsure if Reginal Lutes will be covered even with her hx of Heart Disease   Nichole Smith is currently in the action stage of change. As such, her goal is to continue with weight loss efforts. She has agreed to keeping a food journal and adhering to recommended goals of 1300 calories and 90 protein.   Exercise goals: Older adults should follow the adult guidelines. When older adults cannot meet the adult guidelines, they should be as physically active as their abilities and conditions will allow.  Older adults should do exercises that maintain or improve balance if they are at risk of falling.  Older adults should determine their level of effort for physical activity relative to their level of fitness.   Behavioral modification strategies:  increasing lean protein intake, decreasing simple carbohydrates, increasing vegetables, increasing water intake, no skipping meals, meal planning and cooking strategies, planning for success, and keeping a strict food journal.  [place] has agreed to follow-up with our clinic in 3 weeks. She was informed of the importance of frequent follow-up visits to maximize her success with intensive lifestyle modifications for her multiple health conditions.   Nichole Smith was informed we would discuss her lab results at her next visit unless there is a critical issue that needs to be addressed sooner. Nichole Smith agreed to keep her next visit at the agreed upon time to discuss these results.  Objective:   Blood pressure 118/74, pulse 68, temperature 98 F (36.7 C), height 4\' 11"  (1.499 m), weight 194 lb (88 kg), last menstrual period 02/12/2011, SpO2 94 %. Body mass index is 39.18 kg/m.  General: Cooperative, alert, well developed, in no acute distress. HEENT: Conjunctivae and lids unremarkable. Cardiovascular: Regular rhythm.  Lungs: Normal work of breathing. Neurologic: No focal deficits.   Lab Results  Component Value Date   CREATININE 0.60 05/05/2023   BUN 23 05/05/2023   NA 140 05/05/2023   K 3.6 05/05/2023   CL 100 05/05/2023   CO2 29 02/18/2023   Lab Results  Component Value Date   ALT 36 (H) 02/18/2023   AST 29 02/18/2023   ALKPHOS 71 02/18/2023   BILITOT 0.7 02/18/2023   Lab Results  Component Value Date   HGBA1C 5.6 02/18/2023   HGBA1C 5.7 08/18/2022   HGBA1C 5.5 05/13/2022   HGBA1C 5.5 10/03/2021   HGBA1C 5.3 06/25/2021   Lab Results  Component Value Date   INSULIN 13.8 02/18/2023   INSULIN 22.2 09/16/2022   INSULIN 12.2 05/13/2022   INSULIN 13.6 10/03/2021   INSULIN 12.2 06/25/2021   Lab Results  Component Value Date   TSH 2.70 08/18/2022   Lab Results  Component Value Date   CHOL 119 08/18/2022   HDL 52.70 08/18/2022   LDLCALC 57 08/18/2022   TRIG 46.0 08/18/2022    CHOLHDL 2 08/18/2022   Lab Results  Component Value Date   VD25OH 62.7 02/18/2023   VD25OH 52.5 09/16/2022   VD25OH 42.3 05/13/2022   Lab Results  Component Value Date   WBC 9.4 05/05/2023   HGB 16.0 (H) 05/05/2023   HCT 47.0 (H) 05/05/2023   MCV 92.9 05/05/2023   PLT 250 05/05/2023   No results found for: "IRON", "TIBC", "FERRITIN"  Attestation Statements:   Reviewed by clinician on day of visit: allergies, medications, problem list, medical history, surgical history, family history, social history, and previous encounter notes.  Time spent on visit including pre-visit chart review and post-visit care and charting was 29 minutes.   I have reviewed the above documentation for accuracy and completeness, and I agree with the above. -  Lawrie Tunks d. Landyn Buckalew, NP-C

## 2023-06-09 ENCOUNTER — Encounter: Payer: Self-pay | Admitting: Family Medicine

## 2023-06-09 NOTE — Progress Notes (Signed)
[facility] Summary Report / Loop Recorder 

## 2023-06-15 ENCOUNTER — Encounter (HOSPITAL_BASED_OUTPATIENT_CLINIC_OR_DEPARTMENT_OTHER): Payer: Self-pay | Admitting: Pulmonary Disease

## 2023-06-15 ENCOUNTER — Ambulatory Visit (INDEPENDENT_AMBULATORY_CARE_PROVIDER_SITE_OTHER): Payer: Medicare Other

## 2023-06-15 DIAGNOSIS — I480 Paroxysmal atrial fibrillation: Secondary | ICD-10-CM

## 2023-06-16 LAB — CUP PACEART REMOTE DEVICE CHECK
Date Time Interrogation Session: 20240713230536
Implantable Pulse Generator Implant Date: 20220620

## 2023-06-17 NOTE — Discharge Instructions (Signed)

## 2023-06-18 ENCOUNTER — Ambulatory Visit
Admission: RE | Admit: 2023-06-18 | Discharge: 2023-06-18 | Disposition: A | Discharge status: Home / Self Care | Payer: Medicare Other | Source: Ambulatory Visit | Attending: Family Medicine | Admitting: Family Medicine

## 2023-06-18 DIAGNOSIS — M5416 Radiculopathy, lumbar region: Secondary | ICD-10-CM

## 2023-06-18 MED ORDER — METHYLPREDNISOLONE ACETATE 40 MG/ML INJ SUSP (RADIOLOG
80.0000 mg | Freq: Once | INTRAMUSCULAR | Status: AC
  Administered 2023-06-18: 80 mg via EPIDURAL

## 2023-06-18 MED ORDER — IOPAMIDOL (ISOVUE-M 200) INJECTION 41%
1.0000 mL | Freq: Once | INTRAMUSCULAR | Status: AC
  Administered 2023-06-18: 1 mL via EPIDURAL

## 2023-06-25 NOTE — Telephone Encounter (Signed)
I called [facility] to ask for the ONO results- was placed on a hold and had to leave msg- I asked that the results be faxed to B pod office.

## 2023-06-26 ENCOUNTER — Encounter (HOSPITAL_BASED_OUTPATIENT_CLINIC_OR_DEPARTMENT_OTHER): Payer: Self-pay

## 2023-06-26 ENCOUNTER — Ambulatory Visit (HOSPITAL_BASED_OUTPATIENT_CLINIC_OR_DEPARTMENT_OTHER): Payer: Medicare Other

## 2023-06-26 VITALS — BP 135/68 | HR 63 | Ht 59.0 in | Wt 198.2 lb

## 2023-06-26 DIAGNOSIS — M810 Age-related osteoporosis without current pathological fracture: Secondary | ICD-10-CM | POA: Diagnosis not present

## 2023-06-26 MED ORDER — DENOSUMAB 60 MG/ML ~~LOC~~ SOSY: 60.0000 mg | PREFILLED_SYRINGE | Freq: Once | SUBCUTANEOUS | Status: DC

## 2023-06-26 MED ORDER — DENOSUMAB 60 MG/ML ~~LOC~~ SOSY
60.0000 mg | PREFILLED_SYRINGE | Freq: Once | SUBCUTANEOUS | Status: AC
Start: 2023-06-26 — End: 2023-06-26
  Administered 2023-06-26: 60 mg via SUBCUTANEOUS

## 2023-06-26 NOTE — Progress Notes (Signed)
Patient came in today to receive Prolia 60mg  injection. Injection was given in the back of the left arm. Patient tolerated injection well. tbw

## 2023-06-29 ENCOUNTER — Encounter (INDEPENDENT_AMBULATORY_CARE_PROVIDER_SITE_OTHER): Payer: Self-pay | Admitting: Adult Health

## 2023-06-29 ENCOUNTER — Ambulatory Visit: Payer: Medicare Other | Admitting: Family Medicine

## 2023-06-29 ENCOUNTER — Ambulatory Visit (INDEPENDENT_AMBULATORY_CARE_PROVIDER_SITE_OTHER): Payer: Medicare Other | Admitting: Adult Health

## 2023-06-29 VITALS — BP 101/68 | HR 65 | Temp 98.3°F | Ht 59.0 in | Wt 198.0 lb

## 2023-06-29 DIAGNOSIS — E669 Obesity, unspecified: Secondary | ICD-10-CM

## 2023-06-29 DIAGNOSIS — R632 Polyphagia: Secondary | ICD-10-CM

## 2023-06-29 DIAGNOSIS — Z6841 Body Mass Index (BMI) 40.0 and over, adult: Secondary | ICD-10-CM | POA: Diagnosis not present

## 2023-06-29 DIAGNOSIS — R7303 Prediabetes: Secondary | ICD-10-CM | POA: Diagnosis not present

## 2023-06-29 MED ORDER — ZEPBOUND 15 MG/0.5ML ~~LOC~~ SOAJ: 15.0000 mg | SUBCUTANEOUS | 0 refills | Status: DC

## 2023-06-29 NOTE — Progress Notes (Signed)
WEIGHT SUMMARY AND BIOMETRICS  Vitals Temp: 98.3 F (36.8 C) BP: 101/68 Pulse Rate: 65 SpO2: 95 %   Anthropometric Measurements Height: 4\' 11"  (1.499 m) Weight: 198 lb (89.8 kg) BMI (Calculated): 39.97 Weight at Last Visit: 194lb Weight Lost Since Last Visit: 0 Weight Gained Since Last Visit: 4lb Starting Weight: 238lb Total Weight Loss (lbs): 40 lb (18.1 kg)   Body Composition  Body Fat %: 49.1 % Fat Mass (lbs): 97.2 lbs Muscle Mass (lbs): 95.6 lbs Total Body Water (lbs): 74.2 lbs Visceral Fat Rating : 17   Other Clinical Data Fasting: no Labs: no Today's Visit #: 55 Starting Date: 01/12/19    Chief Complaint:   OBESITY Nichole is here to discuss her progress with her obesity treatment plan. She is on the keeping a food journal and adhering to recommended goals of 1300 calories and 90 protein and states she is following her eating plan approximately 50 % of the time. She states she is exercising Walking/Swimming 20 minutes 7 times per week.   Interim History:  Injectable Hx: Trulicity 4.5mg  replaced with Zepbound 10mg  on 11/12/2022 Zepbound 10mg  replaced with Zebpund 12.5mg  on 02/18/2023 Denies mass in neck, dysphagia, dyspepsia, persistent hoarseness, abdominal pain, or N/V. She has been spacing out Zepbound therapy injection every 10 days. Denies mass in neck, dysphagia, dyspepsia, persistent hoarseness, abdominal pain, or N/V/C   Subjective:   1. Polyphagia [name] reports following the prescribed meal plan closely the last 3 weeks then the last week she frequently snacked and stopped tracking intake. She reports increased food noise. She is currently on weekly Zepbound 12.5mg  every 10 days. Denies mass in neck, dysphagia, dyspepsia, persistent hoarseness, abdominal pain, or N/V/C   2. Prediabetes Lab Results  Component Value Date   HGBA1C 5.6 02/18/2023   HGBA1C 5.7 08/18/2022   HGBA1C 5.5 05/13/2022    Injectable Hx: Trulicity 4.5mg   replaced with Zepbound 10mg  on 11/12/2022 Zepbound 10mg  replaced with Zebpund 12.5mg  on 02/18/2023 Denies mass in neck, dysphagia, dyspepsia, persistent hoarseness, abdominal pain, or N/V. She has been spacing out Zepbound therapy injection every 10 days. Denies mass in neck, dysphagia, dyspepsia, persistent hoarseness, abdominal pain, or N/V/C   Assessment/Plan:   1. Polyphagia Increase protein and decrease sugar/CHO  2. Prediabetes Increase protein and decrease sugar/CHO  3. Obesity, Current BMI 35.97 Refill and Increase tirzepatide (ZEPBOUND) 15 MG/0.5ML Pen Inject 15 mg into the skin once a week. Dispense: 3 mL, Refills: 0 ordered   Call your insurance company and inquire about drug coverage of Reginal Lutes  Nichole Smith is currently in the action stage of change. As such, her goal is to continue with weight loss efforts. She has agreed to keeping a food journal and adhering to recommended goals of 1300 calories and 90 protein.   Exercise goals: Older adults should follow the adult guidelines. When older adults cannot meet the adult guidelines, they should be as physically active as their abilities and conditions will allow.  Older adults should do exercises that maintain or improve balance if they are at risk of falling.  Older adults should determine their level of effort for physical activity relative to their level of fitness.   Behavioral modification strategies: increasing lean protein intake, decreasing simple carbohydrates, increasing vegetables, increasing water intake, no skipping meals, meal planning and cooking strategies, keeping healthy foods in the home, ways to avoid boredom eating, better snacking choices, avoiding temptations, and planning for success.  Nichole Smith has agreed to follow-up with our  clinic in 4 weeks. She was informed of the importance of frequent follow-up visits to maximize her success with intensive lifestyle modifications for her multiple health conditions.    Objective:   Blood pressure 101/68, pulse 65, temperature 98.3 F (36.8 C), height 4\' 11"  (1.499 m), weight 198 lb (89.8 kg), last menstrual period 02/12/2011, SpO2 95%. Body mass index is 39.99 kg/m.  General: Cooperative, alert, well developed, in no acute distress. HEENT: Conjunctivae and lids unremarkable. Cardiovascular: Regular rhythm.  Lungs: Normal work of breathing. Neurologic: No focal deficits.   Lab Results  Component Value Date   CREATININE 0.60 05/05/2023   BUN 23 05/05/2023   NA 140 05/05/2023   K 3.6 05/05/2023   CL 100 05/05/2023   CO2 29 02/18/2023   Lab Results  Component Value Date   ALT 36 (H) 02/18/2023   AST 29 02/18/2023   ALKPHOS 71 02/18/2023   BILITOT 0.7 02/18/2023   Lab Results  Component Value Date   HGBA1C 5.6 02/18/2023   HGBA1C 5.7 08/18/2022   HGBA1C 5.5 05/13/2022   HGBA1C 5.5 10/03/2021   HGBA1C 5.3 06/25/2021   Lab Results  Component Value Date   INSULIN 13.8 02/18/2023   INSULIN 22.2 09/16/2022   INSULIN 12.2 05/13/2022   INSULIN 13.6 10/03/2021   INSULIN 12.2 06/25/2021   Lab Results  Component Value Date   TSH 2.70 08/18/2022   Lab Results  Component Value Date   CHOL 119 08/18/2022   HDL 52.70 08/18/2022   LDLCALC 57 08/18/2022   TRIG 46.0 08/18/2022   CHOLHDL 2 08/18/2022   Lab Results  Component Value Date   VD25OH 62.7 02/18/2023   VD25OH 52.5 09/16/2022   VD25OH 42.3 05/13/2022   Lab Results  Component Value Date   WBC 9.4 05/05/2023   HGB 16.0 (H) 05/05/2023   HCT 47.0 (H) 05/05/2023   MCV 92.9 05/05/2023   PLT 250 05/05/2023   No results found for: "IRON", "TIBC", "FERRITIN"  Attestation Statements:   Reviewed by clinician on day of visit: allergies, medications, problem list, medical history, surgical history, family history, social history, and previous encounter notes.  I have reviewed the above documentation for accuracy and completeness, and I agree with the above. -  Nichole Smith d. Nichole Allers,  NP-C

## 2023-07-01 NOTE — Progress Notes (Signed)
[facility] Summary Report / Loop Recorder 

## 2023-07-06 ENCOUNTER — Encounter: Payer: Self-pay | Admitting: Family Medicine

## 2023-07-06 ENCOUNTER — Telehealth (INDEPENDENT_AMBULATORY_CARE_PROVIDER_SITE_OTHER): Payer: Medicare Other | Admitting: Family Medicine

## 2023-07-06 VITALS — Ht 59.0 in | Wt 198.0 lb

## 2023-07-06 DIAGNOSIS — J029 Acute pharyngitis, unspecified: Secondary | ICD-10-CM

## 2023-07-06 DIAGNOSIS — U071 COVID-19: Secondary | ICD-10-CM

## 2023-07-06 LAB — POC COVID19 BINAXNOW: SARS Coronavirus 2 Ag: POSITIVE — AB

## 2023-07-06 MED ORDER — MOLNUPIRAVIR EUA 200MG CAPSULE: 4.0000 | ORAL_CAPSULE | Freq: Two times a day (BID) | ORAL | 0 refills | Status: AC

## 2023-07-06 MED ORDER — HYDROCODONE BIT-HOMATROP MBR 5-1.5 MG/5ML PO SOLN: 5.0000 mL | Freq: Three times a day (TID) | ORAL | 0 refills | Status: DC | PRN

## 2023-07-06 NOTE — Telephone Encounter (Signed)
Called [employer] and received patients ONO results for Dr. Craige Cotta to review.  Dr. Craige Cotta returns to office 07/07/23.  ONO results faxed to [facility] office attention Dr. Craige Cotta.

## 2023-07-06 NOTE — Progress Notes (Signed)
Patient ID: Nichole Smith, female   DOB: Oct 24, 1956, 67 y.o.   MRN: [id number]   Virtual Visit via Video Note  I connected with Nichole Smith on 07/06/23 at 10:00 AM EDT by a video enabled telemedicine application and verified that I am speaking with the correct person using two identifiers.  Location patient: home Location provider:work or home office Persons participating in the virtual visit: patient, provider  I discussed the limitations of evaluation and management by telemedicine and the availability of in person appointments. The patient expressed understanding and agreed to proceed.   HPI:  Nichole Smith has COVID-19 by positive test here today.  Onset of symptoms yesterday.  She has had some mild headache, nasal congestion, sore throat, and mild cough.  No fever.  Has taken over-the-counter Tylenol as needed for headaches.  Did have a known sick contact.  Her last episode of COVID was December 2022.  At that time, she took molnupiravir and seemed to improve probably.  She would like to consider antiviral therapy.  Denies any nausea, vomiting, or diarrhea.  ROS: See pertinent positives and negatives per HPI.  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  Download 08/2014:  Good compliance, breakthru apnea on 10cm.  Changed to auto 5-12cm >> good control of AHI on f/u download ONO on CPAP:       Osteoarthritis    PCOS (polycystic ovarian syndrome)    PMB (postmenopausal bleeding)    SUI (stress urinary incontinence,  female)    h/o   Wears glasses     Past Surgical History:  Procedure Laterality Date   ANTERIOR CERVICAL DECOMP/DISCECTOMY FUSION  12/24/2017   Procedure: Cervical five-six Cervical six-seven Anterior cervical decompression/discectomy/fusion;  Surgeon: Maeola Harman, MD;  Location: [facility] OR;  Service: Neurosurgery;;   CARPAL TUNNEL RELEASE Left 12/24/2017   Procedure: LEFT CARPAL TUNNEL RELEASE;  Surgeon: Maeola Harman, MD;  Location: [facility] OR;  Service: Neurosurgery;  Laterality: Left;   CESAREAN SECTION     x 1 - twins   COLONOSCOPY  2021   DILATATION & CURRETTAGE/HYSTEROSCOPY WITH RESECTOCOPE N/A 07/18/2013   Procedure: DILATATION & CURETTAGE/HYSTEROSCOPY WITH RESECTOCOPE;  Surgeon: Annamaria Boots, MD;  Location: [facility];  Service: Gynecology;  Laterality: N/A;  mass resection   HYSTEROSCOPY WITH D & C N/A 05/05/2023   Procedure: DILATATION AND CURETTAGE /HYSTEROSCOPY;  Surgeon: Jerene Bears, MD;  Location: [facility];  Service: Gynecology;  Laterality: N/A;   implantable loop recorder placement  05/20/2021   Medtronic Reveal Linq model [id number] implantable loop recorder (SN [id number])   Lung tumor removed  2012   Rt lung carcinoid   MOUTH SURGERY     pre-cancerous ulcer removed   POLYPECTOMY     SHOULDER OPEN ROTATOR CUFF REPAIR  07/20/2010   Right   WISDOM TOOTH EXTRACTION      Family History  Problem Relation Age of Onset   Stroke  Mother        died age 73   Hypertension Mother    Sudden death Mother    Alcoholism Mother    Coronary artery disease Father        died age 58   Hypertension Father    Liver disease Father    Alcohol abuse Father    Obesity Father    Parkinson's disease Brother    Diabetes Brother    Hypertension Brother    Hypertension Maternal Uncle    Colon cancer Neg Hx    Rectal cancer Neg Hx    Stomach cancer Neg Hx    Colon polyps Neg Hx    Esophageal cancer Neg Hx     SOCIAL HX: non-smoker   Current Outpatient Medications:     aspirin EC 81 MG tablet, Take 81 mg by mouth at bedtime., Disp: , Rfl:    atorvastatin (LIPITOR) 40 MG tablet, Take 1 tablet (40 mg total) by mouth daily., Disp: 90 tablet, Rfl: 3   CALCIUM PO, Take 1 tablet by mouth at bedtime. , Disp: , Rfl:    chlorthalidone (HYGROTON) 25 MG tablet, Take 1 tablet (25 mg total) by mouth daily., Disp: 90 tablet, Rfl: 3   denosumab (PROLIA) 60 MG/ML SOSY injection, Inject 60 mg into the skin every 6 (six) months., Disp: , Rfl:    diltiazem (CARDIZEM CD) 180 MG 24 hr capsule, Take 1 capsule (180 mg total) by mouth daily., Disp: 90 capsule, Rfl: 3   diltiazem (CARDIZEM) 30 MG tablet, 1 tablet every 6 hours for palpitations, Disp: 60 tablet, Rfl: 3   Doxepin HCl 3 MG TABS, Take 1 tablet (3 mg total) by mouth at bedtime as needed., Disp: 30 tablet, Rfl: 2   estradiol (ESTRACE) 0.1 MG/GM vaginal cream, INSERT 1 GM VAGINALLY TWICE WEEKLY, Disp: 42.5 g, Rfl: 2   GEMTESA 75 MG TABS, Take 1 tablet by mouth daily as needed., Disp: , Rfl:    HYDROcodone bit-homatropine (HYCODAN) 5-1.5 MG/5ML syrup, Take 5 mLs by mouth every 8 (eight) hours as needed for cough., Disp: 120 mL, Rfl: 0   HYDROcodone bit-homatropine (HYCODAN) 5-1.5 MG/5ML syrup, Take 5 mLs by mouth every 8 (eight) hours as needed for cough., Disp: 120 mL, Rfl: 0   ibuprofen (ADVIL) 800 MG tablet, Take 1 tablet (800 mg total) by mouth every 8 (eight) hours as needed., Disp: 30 tablet, Rfl: 0   metoprolol succinate (TOPROL-XL) 50 MG 24 hr tablet, Take 1 tablet (50 mg total) by mouth daily., Disp: 90 tablet, Rfl: 3   molnupiravir EUA (LAGEVRIO) 200 mg CAPS capsule, Take 4 capsules (800 mg total) by mouth 2 (two) times daily for 5 days., Disp: 40 capsule, Rfl: 0   Multiple Vitamins-Minerals (PRESERVISION AREDS 2 PO), Take 1 tablet by mouth 2 (two) times daily., Disp: , Rfl:    SYNTHROID 137 MCG tablet, TAKE 1 TABLET DAILY (NEED TO SCHEDULE APPOINTMENT FOR MORE REFILLS. NEED TO KEEP UPCOMING APPOINTMENT), Disp: 90  tablet, Rfl: 3   tirzepatide (ZEPBOUND) 15 MG/0.5ML Pen, Inject 15 mg into the skin once a week., Disp: 3 mL, Rfl: 0  EXAM:  VITALS per patient if applicable:  GENERAL: alert, oriented, appears well and in no acute distress  HEENT: atraumatic, conjunttiva clear, no obvious abnormalities on inspection of external nose and ears  NECK: normal movements of the head and neck  LUNGS: on inspection no signs of respiratory distress, breathing rate appears normal, no obvious gross SOB, gasping or  wheezing  CV: no obvious cyanosis  MS: moves all visible extremities without noticeable abnormality  PSYCH/NEURO: pleasant and cooperative, no obvious depression or anxiety, speech and thought processing grossly intact  ASSESSMENT AND PLAN:  Discussed the following assessment and plan:  COVID-19.  Patient's had symptoms less than 24 hours.  We discussed antiviral therapy and she would like to proceed.  Because of potential for multiple drug interactions with Paxlovid with her current drug regimen we decided to prescribe molnupiravir 4 capsules by mouth twice daily for 5 days.  Plenty of fluids and rest.  Follow-up for any persistent or worsening symptoms.  She is aware of isolation precautions     I discussed the assessment and treatment plan with the patient. The patient was provided an opportunity to ask questions and all were answered. The patient agreed with the plan and demonstrated an understanding of the instructions.   The patient was advised to call back or seek an in-person evaluation if the symptoms worsen or if the condition fails to improve as anticipated.     Evelena Peat, MD

## 2023-07-13 ENCOUNTER — Telehealth: Payer: Self-pay | Admitting: Pulmonary Disease

## 2023-07-13 NOTE — Telephone Encounter (Signed)
ONO with CPAP 06/02/23 >> test time 7 hrs 43 min.  Baseline SpO2 92%, low SpO2 85%.  Spent 8 min 38 sec with SpO2 < 88%.  Please let her know her oxygen test looks okay with CPAP.  Can try changing her CPAP to 14 cm H2O if she feels her current pressure setting is too high.

## 2023-07-17 LAB — CUP PACEART REMOTE DEVICE CHECK
Date Time Interrogation Session: 20240816093445
Implantable Pulse Generator Implant Date: 20220620

## 2023-07-20 ENCOUNTER — Ambulatory Visit: Payer: Medicare Other

## 2023-07-20 DIAGNOSIS — I480 Paroxysmal atrial fibrillation: Secondary | ICD-10-CM

## 2023-07-21 ENCOUNTER — Encounter: Payer: Medicare Other | Admitting: Cardiology

## 2023-07-21 NOTE — Telephone Encounter (Signed)
Called the pt and there was no answer- LMTCB    

## 2023-07-21 NOTE — Telephone Encounter (Signed)
Patient is calling to receive her test results. She can be reached at [phone]

## 2023-07-27 NOTE — Progress Notes (Unsigned)
.smr  Office: [phone]  /  Fax: [phone]  WEIGHT SUMMARY AND BIOMETRICS  Vitals Temp: 98.2 F (36.8 C) BP: 103/65 Pulse Rate: 74 SpO2: 93 %   Anthropometric Measurements Height: 4\' 11"  (1.499 m) Weight: 194 lb (88 kg) BMI (Calculated): 39.16 Weight at Last Visit: 198 lb Weight Lost Since Last Visit: 4 lb Weight Gained Since Last Visit: 0 Starting Weight: 238 lb Total Weight Loss (lbs): 44 lb (20 kg) Peak Weight: 238 lb   Body Composition  Body Fat %: 49.5 % Fat Mass (lbs): 96.2 lbs Muscle Mass (lbs): 93.2 lbs Total Body Water (lbs): 73.6 lbs Visceral Fat Rating : 16   Other Clinical Data Fasting: no Labs: no Today's Visit #: 29 Starting Date: 01/12/19     HPI  Chief Complaint: OBESITY  Nichole Smith is here to discuss her progress with her obesity treatment plan. She is on the keeping a food journal and adhering to recommended goals of 1200-1300 calories and 90+ grams of protein and states she is following her eating plan approximately 60 % of the time. She states she is exercising walking/swimming/weights 30 minutes 7 times per week.   Discussed the use of AI scribe software for clinical note transcription with the patient, who gave verbal consent to proceed.  History of Present Illness   Interval History:  Since last office visit she down 4 lbs.     The patient is a 67 year old female with a history of obesity, hyperlipidemia, and hypertension. She is currently on Zepbound for weight loss. Despite adherence to her medication and dietary changes, she reports frustration with her weight loss progress. She notes that she gained four pounds during a recent trip but managed to lose the weight upon return. She has been tracking her calorie intake and notes that she often meets her calorie and protein goals. However, she admits to occasional indulgences, such as tasting different flavors of cookies and having birthday cake during a family celebration.  The patient  is due for cataract surgery in the coming month, which she anticipates may affect her routine and progress.  Pharmacotherapy: Zepbound 15 mg . Denies mass in neck, dysphagia, dyspepsia, persistent hoarseness, abdominal pain, or N/V/Constipation or diarrhea. Has annual eye exam. Mood is stable.  Injectable Hx: Trulicity 4.5mg  replaced with Zepbound 10mg  on 11/12/2022 Zepbound 10mg  replaced with Zepbound 12.5mg  on 02/18/2023 Zepbound 12.5 mg replaced with Zepbound 15 mg on 06/29/23  TREATMENT PLAN FOR OBESITY: Total weight loss 44 lbs TBW loss of ~ 18.5% Patient is on Zepbound  for weight loss. She reports frustration with slow progress and inconsistent weight loss. She is meeting her calorie and protein goals, and is engaging in regular physical activity as tolerated due to chronic back problems. She recently had COVID, which disrupted her routine and led to comfort eating. She is paying for Zepbound out of pocket and is considering spacing out the doses to every 8 days to save money. -Continue Zepbound 15 mg - started 06/29/2023 Recommended Dietary Goals  Nichole Smith is currently in the action stage of change. As such, her goal is to continue weight management plan. She has agreed to keeping a food journal and adhering to recommended goals of 1200-1300 calories and 90+ grams of  protein.  Behavioral Intervention  We discussed the following Behavioral Modification Strategies today: increasing lean protein intake, decreasing simple carbohydrates , increasing vegetables, increasing lower glycemic fruits, increasing fiber rich foods, avoiding skipping meals, increasing water intake, work on tracking and journaling calories using  tracking application, emotional eating strategies and understanding the difference between hunger signals and cravings, continue to practice mindfulness when eating, planning for success, and celebration eating strategies.  Additional resources provided today: 100 calorie protein  snacks      [email or web address] as recipe resource  Recommended Physical Activity Goals  Nichole Smith has been advised to work up to 150 minutes of moderate intensity aerobic activity a week and strengthening exercises 2-3 times per week for cardiovascular health, weight loss maintenance and preservation of muscle mass.   She has agreed to Continue current level of physical activity  and Think about ways to increase daily physical activity and overcoming barriers to exercise   Pharmacotherapy We discussed various medication options to help Rain with her weight loss efforts and we both agreed to continue Zepbound 15 mg for weight loss.    Return in about 6 weeks (around 09/08/2023).[name] She was informed of the importance of frequent follow up visits to maximize her success with intensive lifestyle modifications for her multiple health conditions.  PHYSICAL EXAM:  Blood pressure 103/65, pulse 74, temperature 98.2 F (36.8 C), height 4\' 11"  (1.499 m), weight 194 lb (88 kg), last menstrual period 02/12/2011, SpO2 93%. Body mass index is 39.18 kg/m.  General: She is overweight, cooperative, alert, well developed, and in no acute distress. PSYCH: Has normal mood, affect and thought process.   Cardiovascular: HR 70's regular, BP 103/65 Lungs: Normal breathing effort, no conversational dyspnea. Neuro: no focal deficits  DIAGNOSTIC DATA REVIEWED:  BMET    Component Value Date/Time   NA 140 05/05/2023 1118   NA 141 02/18/2023 0913   K 3.6 05/05/2023 1118   CL 100 05/05/2023 1118   CO2 29 02/18/2023 0913   GLUCOSE 96 05/05/2023 1118   BUN 23 05/05/2023 1118   BUN 20 02/18/2023 0913   CREATININE 0.60 05/05/2023 1118   CALCIUM 9.6 02/18/2023 0913   GFRNONAA 91 01/22/2021 1047   GFRAA 105 01/22/2021 1047   Lab Results  Component Value Date   HGBA1C 5.6 02/18/2023   HGBA1C 5.9 06/07/2018   Lab Results  Component Value Date   INSULIN 13.8 02/18/2023   INSULIN 20.4 01/12/2019   Lab Results   Component Value Date   TSH 2.70 08/18/2022   CBC    Component Value Date/Time   WBC 9.4 05/05/2023 1115   RBC 5.07 05/05/2023 1115   HGB 16.0 (H) 05/05/2023 1118   HGB 15.9 06/27/2020 1123   HGB 15.4 03/13/2014 1422   HCT 47.0 (H) 05/05/2023 1118   HCT 47.9 (H) 06/27/2020 1123   HCT 45.3 03/13/2014 1422   PLT 250 05/05/2023 1115   PLT 243 06/27/2020 1123   MCV 92.9 05/05/2023 1115   MCV 94 06/27/2020 1123   MCV 89.0 03/13/2014 1422   MCH 33.1 05/05/2023 1115   MCHC 35.7 05/05/2023 1115   RDW 13.1 05/05/2023 1115   RDW 12.1 06/27/2020 1123   RDW 13.2 03/13/2014 1422   Iron Studies No results found for: "IRON", "TIBC", "FERRITIN", "IRONPCTSAT" Lipid Panel     Component Value Date/Time   CHOL 119 08/18/2022 0804   CHOL 148 10/03/2021 0806   CHOL 110 11/03/2016 0000   TRIG 46.0 08/18/2022 0804   TRIG 54 11/03/2016 0000   HDL 52.70 08/18/2022 0804   HDL 58 10/03/2021 0806   HDL 48 (L) 11/03/2016 0000   CHOLHDL 2 08/18/2022 0804   VLDL 9.2 08/18/2022 0804   LDLCALC 57 08/18/2022 0804   LDLCALC 76 10/03/2021  1610   LDLCALC 49 11/03/2016 0000   Hepatic Function Panel     Component Value Date/Time   PROT 6.9 02/18/2023 0913   ALBUMIN 4.1 02/18/2023 0913   AST 29 02/18/2023 0913   ALT 36 (H) 02/18/2023 0913   ALKPHOS 71 02/18/2023 0913   BILITOT 0.7 02/18/2023 0913   BILIDIR 0.1 08/18/2022 0804   IBILI 0.4 09/08/2016 0749      Component Value Date/Time   TSH 2.70 08/18/2022 0804   Nutritional Lab Results  Component Value Date   VD25OH 62.7 02/18/2023   VD25OH 52.5 09/16/2022   VD25OH 42.3 05/13/2022    ASSOCIATED CONDITIONS ADDRESSED TODAY  ASSESSMENT AND PLAN  Problem List Items Addressed This Visit     Midline low back pain without sciatica   Prediabetes   BMI 39.0-39.9,adult   Polyphagia - Primary   Relevant Medications   tirzepatide (ZEPBOUND) 15 MG/0.5ML Pen   Cataract of both eyes   Obesity, Starting BMI 46.48   Relevant Medications    tirzepatide (ZEPBOUND) 15 MG/0.5ML Pen    Prediabetes Last A1c was 5.6- stable and at goal/ Insulin 13.8- not at goal, but nearing goal of < 5.   Medication(s): Zepbound 15 mg SQ weekly Polyphagia:No She is working on nutrition plan to decrease simple carbohydrates, increase lean proteins and exercise to promote weight loss, improve glycemic control and prevent progression to Type 2 diabetes.   Lab Results  Component Value Date   HGBA1C 5.6 02/18/2023   HGBA1C 5.7 08/18/2022   HGBA1C 5.5 05/13/2022   HGBA1C 5.5 10/03/2021   HGBA1C 5.3 06/25/2021   Lab Results  Component Value Date   INSULIN 13.8 02/18/2023   INSULIN 22.2 09/16/2022   INSULIN 12.2 05/13/2022   INSULIN 13.6 10/03/2021   INSULIN 12.2 06/25/2021    Plan: Continue and refill Zepbound 15 mg SQ weekly Continue working on nutrition plan to decrease simple carbohydrates, increase lean proteins and exercise to promote weight loss, improve glycemic control and prevent progression to Type 2 diabetes.  Recheck A1c, fasting insulin over the next 2-3 months.   Polyphagia Currently this is moderately controlled. Medication(s): Zepbound 15 mg SQ weekly.  Reported side effects: None  Plan: Continue and refill Zepbound 15 mg SQ weekly Continue to work on nutritional and behavioral strategies to promote weight loss.    Chronic low back pain:  She also has chronic back problems, which limit her ability to increase exercise intensity. She currently engages in water aerobics and walks her dog for at least 20 minutes a day. She also spends time doing back exercises. She received an injection for her back pain about six weeks ago, which provided relief for four weeks, but the pain has since returned. Plan: -Continue current management. -Encourage low-impact exercises like water aerobics and continue to work on nutritional and behavioral strategies to promote weight loss.  . Cataracts: Patient has cataract surgery scheduled in  the near future. She is aware that this may temporarily disrupt her weight loss efforts. -Advise patient to resume normal diet and exercise routine as soon as medically possible after surgery.   ATTESTASTION STATEMENTS:  Reviewed by clinician on day of visit: allergies, medications, problem list, medical history, surgical history, family history, social history, and previous encounter notes.   I have personally spent 42 minutes total time today in preparation, patient care, nutritional counseling and documentation for this visit, including the following: review of clinical lab tests; review of medical tests/procedures/services.  Obinna Ehresman, PA-C

## 2023-07-28 ENCOUNTER — Encounter (INDEPENDENT_AMBULATORY_CARE_PROVIDER_SITE_OTHER): Payer: Self-pay | Admitting: Physician Assistant

## 2023-07-28 ENCOUNTER — Ambulatory Visit (INDEPENDENT_AMBULATORY_CARE_PROVIDER_SITE_OTHER): Payer: Medicare Other | Admitting: Physician Assistant

## 2023-07-28 VITALS — BP 103/65 | HR 74 | Temp 98.2°F | Ht 59.0 in | Wt 194.0 lb

## 2023-07-28 DIAGNOSIS — M545 Low back pain, unspecified: Secondary | ICD-10-CM

## 2023-07-28 DIAGNOSIS — R632 Polyphagia: Secondary | ICD-10-CM

## 2023-07-28 DIAGNOSIS — Z85110 Personal history of malignant carcinoid tumor of bronchus and lung: Secondary | ICD-10-CM

## 2023-07-28 DIAGNOSIS — I10 Essential (primary) hypertension: Secondary | ICD-10-CM

## 2023-07-28 DIAGNOSIS — R7303 Prediabetes: Secondary | ICD-10-CM | POA: Diagnosis not present

## 2023-07-28 DIAGNOSIS — G8929 Other chronic pain: Secondary | ICD-10-CM

## 2023-07-28 DIAGNOSIS — H269 Unspecified cataract: Secondary | ICD-10-CM | POA: Insufficient documentation

## 2023-07-28 DIAGNOSIS — Z6839 Body mass index (BMI) 39.0-39.9, adult: Secondary | ICD-10-CM

## 2023-07-28 DIAGNOSIS — E559 Vitamin D deficiency, unspecified: Secondary | ICD-10-CM

## 2023-07-28 DIAGNOSIS — E669 Obesity, unspecified: Secondary | ICD-10-CM

## 2023-07-28 DIAGNOSIS — D582 Other hemoglobinopathies: Secondary | ICD-10-CM

## 2023-07-28 MED ORDER — ZEPBOUND 15 MG/0.5ML ~~LOC~~ SOAJ
15.0000 mg | SUBCUTANEOUS | 2 refills | Status: DC
Start: 2023-07-28 — End: 2023-09-08

## 2023-07-29 ENCOUNTER — Encounter: Payer: Self-pay | Admitting: Family Medicine

## 2023-07-30 ENCOUNTER — Encounter: Payer: Self-pay | Admitting: Cardiology

## 2023-07-30 ENCOUNTER — Telehealth: Payer: Self-pay | Admitting: *Deleted

## 2023-07-30 NOTE — Telephone Encounter (Signed)
There was a note in appt notes that pt would not see afib clinic again. She states she never said this and ok with seeing them. Aware I will forward to their office to arrange appt per Camnitz.

## 2023-07-30 NOTE — Telephone Encounter (Signed)
Left message to call back   (?? Move appt to 9/6 @ 12:15 if still available when calls back)

## 2023-07-30 NOTE — Telephone Encounter (Signed)
-----   Message from [facility] sent at 07/27/2023  1:14 PM EDT ----- Abnormal ILR reviewed. Notable for A-fib episode up to 9 hours.  Needs to see A-fib clinic for further management.

## 2023-07-30 NOTE — Telephone Encounter (Signed)
Pt states she left a message to call her back and she would not loose her place in the que..... but she never heard back from the office - which is why she sent this message. Pt aware I will forward this to mngt to address  (See today's telephone note from today about the ILR result I called her about)

## 2023-07-30 NOTE — Telephone Encounter (Signed)
Left message for patient to check mychart for results and to respond if she needs to make a changer to her CPAP.

## 2023-07-31 ENCOUNTER — Ambulatory Visit (INDEPENDENT_AMBULATORY_CARE_PROVIDER_SITE_OTHER): Payer: Medicare Other | Admitting: Pulmonary Disease

## 2023-07-31 ENCOUNTER — Encounter (HOSPITAL_BASED_OUTPATIENT_CLINIC_OR_DEPARTMENT_OTHER): Payer: Self-pay | Admitting: Pulmonary Disease

## 2023-07-31 VITALS — BP 108/70 | HR 78 | Resp 18 | Ht 59.0 in | Wt 196.4 lb

## 2023-07-31 DIAGNOSIS — F5101 Primary insomnia: Secondary | ICD-10-CM | POA: Diagnosis not present

## 2023-07-31 DIAGNOSIS — G4730 Sleep apnea, unspecified: Secondary | ICD-10-CM

## 2023-07-31 DIAGNOSIS — E669 Obesity, unspecified: Secondary | ICD-10-CM

## 2023-07-31 DIAGNOSIS — G4733 Obstructive sleep apnea (adult) (pediatric): Secondary | ICD-10-CM | POA: Diagnosis not present

## 2023-07-31 MED ORDER — DOXEPIN HCL 3 MG PO TABS: 3.0000 mg | ORAL_TABLET | Freq: Every evening | ORAL | 1 refills | Status: DC | PRN

## 2023-07-31 NOTE — Progress Notes (Signed)
[facility] Summary Report / Loop Recorder 

## 2023-07-31 NOTE — Progress Notes (Signed)
[facility]  Chief Complaint  Patient presents with   Follow-up    OSA with CPAP. Doing good since last visit.     Past Surgical History:  She  has a past surgical history that includes Cesarean section; Shoulder open rotator cuff repair (07/20/2010); Lung tumor removed (2012); Dilatation & currettage/hysteroscopy with resectoscope (N/A, 07/18/2013); Wisdom tooth extraction; Mouth surgery; Colonoscopy (2021); Anterior cervical decomp/discectomy fusion (12/24/2017); Carpal tunnel release (Left, 12/24/2017); Polypectomy; implantable loop recorder placement (05/20/2021); and Hysteroscopy with D & C (N/A, 05/05/2023).  Past Medical History:  Urine incontinence, PCOS, OA, Leukocytoclastic vasculitis, Hypothyroidism, HTN, Coronary calcification, Carcinoid tumor of lung 2012   Constitutional:  BP 108/70   Pulse 78   Resp 18   Ht 4\' 11"  (1.499 m)   Wt 196 lb 6.4 oz (89.1 kg)   LMP 02/12/2011   SpO2 95%   BMI 39.67 kg/m   Brief Summary:  Nichole Smith is a 67 y.o. female with obstructive sleep apnea.      Subjective:   Overnight oximetry was okay.  Using CPAP nightly.  No issues with mask fit or pressure.  Not having much cough.  Had COVID earlier this month, but symptoms were very mild.  Doxepin helps when she uses it.  She likes the idea of having the option to use it when she feels like she needs it.    Physical Exam:   Appearance - well kempt   ENMT - no sinus tenderness, no oral exudate, no LAN, Mallampati 3 airway, no stridor  Respiratory - equal breath sounds bilaterally, no wheezing or rales  CV - s1s2 regular rate and rhythm, no murmurs  Ext - no clubbing, no edema  Skin - no rashes  Psych - normal mood and affect   Pulmonary testing:    Chest Imaging:    Sleep Tests:  PSG 07/10/14 >> AHI 85 ONO with CPAP 04/12/18 >> test time 6 hrs 34 min.  Average SpO2 90%, low SpO2 82%.  Spent 43.5 min with SpO2 <  88%. ONO with CPAP 06/08/18 >> test time 8 hrs 42 min.  Basal SpO2 93.6%, low SpO2 81%.  Spent 4.3 min with SpO2 < 88%. ONO with CPAP 06/02/23 >> test time 7 hrs 43 min. Baseline SpO2 92%, low SpO2 85%. Spent 8 min 38 sec with SpO2 < 88%.  CPAP 05/01/23 to 07/29/23 >> used on 90 of 90 nights with average 7 hrs 48 min.  Average AHI 0.7 with CPAP 16 cm H2O  Cardiac Tests:  Echo 12/13/18 >> EF 60 to 65%, grade 1 DD  Social History:  She  reports that she has never smoked. She has never used smokeless tobacco. She reports that she does not currently use alcohol. She reports that she does not use drugs.  Family History:  Her family history includes Alcohol abuse in her father; Alcoholism in her mother; Coronary artery disease in her father; Diabetes in her brother; Hypertension in her brother, father, maternal uncle, and mother; Liver disease in her father; Obesity in her father; Parkinson's disease in her brother; Stroke in her mother; Sudden death in her mother.     Assessment/Plan:   Obstructive sleep apnea. - she is compliant with CPAP and reports benefit from therapy - she uses [employer] for her DME - current CPAP ordered January 2022 - will change CPAP to 14 cm H2O  Sleep maintenance insomnia. - reviewed proper sleep hygiene, normal sleep patterns with aging,  and setting realistic expectations from sleep - discussed stimulus control, sleep restriction, and relaxation techniques - zaleplon wasn't effective and benadryl caused a hangover effect - continue prn dozepin 3 mg; will send 3 months supply to mail-order pharmacy - some of her sleep difficulties are related to pain symptoms  Cough. - intermittent and not currently an issue - no history of smoking and most recent chest xray was normal - likely gets asthmatic bronchitis related to respiratory infections, post nasal drip from seasonal allergies, and intermittent reflux - she will call if her cough recurs, and then determine if  additional assessment is needed  Erythrocytosis. - previously seen by Dr. Artis Delay with hematology  Obesity. - followed by [facility]  Coronary calcification, Hypertension, Paroxysmal atrial fibrillation. - followed by Dr. Armanda Magic and Dr. Loman Brooklyn with cardiology  Leukocytoclastic vasculitis. - followed by [facility]   History of carcinoid tumor. - she gets yearly chest xray  Time Spent Involved in Patient Care on Day of Examination:  26 minutes  Follow up:   Patient Instructions  Will change your CPAP to 14 cm water pressure  Follow up in 6 months with Dr. Vassie Loll  Medication List:   Allergies as of 07/31/2023       Reactions   Neomycin Other (See Comments)   Inflammation--eye drop   Ace Inhibitors Cough        Medication List        Accurate as of July 31, 2023 10:54 AM. If you have any questions, ask your nurse or doctor.          aspirin EC 81 MG tablet Take 81 mg by mouth at bedtime.   atorvastatin 40 MG tablet Commonly known as: LIPITOR Take 1 tablet (40 mg total) by mouth daily.   CALCIUM PO Take 1 tablet by mouth at bedtime.   chlorthalidone 25 MG tablet Commonly known as: HYGROTON Take 1 tablet (25 mg total) by mouth daily.   denosumab 60 MG/ML Sosy injection Commonly known as: PROLIA Inject 60 mg into the skin every 6 (six) months.   diltiazem 180 MG 24 hr capsule Commonly known as: CARDIZEM CD Take 1 capsule (180 mg total) by mouth daily.   diltiazem 30 MG tablet Commonly known as: Cardizem 1 tablet every 6 hours for palpitations   Doxepin HCl 3 MG Tabs Take 1 tablet (3 mg total) by mouth at bedtime as needed.   estradiol 0.1 MG/GM vaginal cream Commonly known as: ESTRACE INSERT 1 GM VAGINALLY TWICE WEEKLY   Gemtesa 75 MG Tabs Generic drug: Vibegron Take 1 tablet by mouth daily as needed.   HYDROcodone bit-homatropine 5-1.5 MG/5ML syrup Commonly known as: HYCODAN Take 5 mLs by  mouth every 8 (eight) hours as needed for cough.   ibuprofen 800 MG tablet Commonly known as: ADVIL Take 1 tablet (800 mg total) by mouth every 8 (eight) hours as needed.   metoprolol succinate 50 MG 24 hr tablet Commonly known as: TOPROL-XL Take 1 tablet (50 mg total) by mouth daily.   PRESERVISION AREDS 2 PO Take 1 tablet by mouth 2 (two) times daily.   Synthroid 137 MCG tablet Generic drug: levothyroxine TAKE 1 TABLET DAILY (NEED TO SCHEDULE APPOINTMENT FOR MORE REFILLS. NEED TO KEEP UPCOMING APPOINTMENT)   Zepbound 15 MG/0.5ML Pen Generic drug: tirzepatide Inject 15 mg into the skin once a week.        Signature:  Coralyn Helling, MD [facility] Pager - [phone] 07/31/2023, 10:54 AM

## 2023-07-31 NOTE — Patient Instructions (Signed)
Will change your CPAP to 14 cm water pressure  Follow up in 6 months with Dr. Vassie Loll

## 2023-08-07 ENCOUNTER — Ambulatory Visit (HOSPITAL_COMMUNITY)
Admission: RE | Admit: 2023-08-07 | Discharge: 2023-08-07 | Disposition: A | Discharge status: Home / Self Care | Payer: Medicare Other | Source: Ambulatory Visit | Attending: Physician Assistant | Admitting: Physician Assistant

## 2023-08-07 ENCOUNTER — Encounter: Payer: Self-pay | Admitting: Cardiology

## 2023-08-07 ENCOUNTER — Encounter (HOSPITAL_COMMUNITY): Payer: Self-pay | Admitting: Physician Assistant

## 2023-08-07 VITALS — BP 118/88 | HR 75 | Ht 59.0 in | Wt 197.6 lb

## 2023-08-07 DIAGNOSIS — Z79899 Other long term (current) drug therapy: Secondary | ICD-10-CM | POA: Diagnosis not present

## 2023-08-07 DIAGNOSIS — I480 Paroxysmal atrial fibrillation: Secondary | ICD-10-CM | POA: Insufficient documentation

## 2023-08-07 DIAGNOSIS — I2510 Atherosclerotic heart disease of native coronary artery without angina pectoris: Secondary | ICD-10-CM | POA: Diagnosis not present

## 2023-08-07 DIAGNOSIS — D6869 Other thrombophilia: Secondary | ICD-10-CM | POA: Diagnosis not present

## 2023-08-07 DIAGNOSIS — I10 Essential (primary) hypertension: Secondary | ICD-10-CM | POA: Diagnosis not present

## 2023-08-07 DIAGNOSIS — G4733 Obstructive sleep apnea (adult) (pediatric): Secondary | ICD-10-CM | POA: Diagnosis not present

## 2023-08-07 DIAGNOSIS — Z7901 Long term (current) use of anticoagulants: Secondary | ICD-10-CM | POA: Diagnosis not present

## 2023-08-07 DIAGNOSIS — E669 Obesity, unspecified: Secondary | ICD-10-CM | POA: Diagnosis not present

## 2023-08-07 DIAGNOSIS — Z6839 Body mass index (BMI) 39.0-39.9, adult: Secondary | ICD-10-CM | POA: Insufficient documentation

## 2023-08-07 MED ORDER — RIVAROXABAN 20 MG PO TABS: 20.0000 mg | ORAL_TABLET | Freq: Every day | ORAL | 3 refills | Status: DC

## 2023-08-07 NOTE — Patient Instructions (Addendum)
Stop aspirin  Start Xarelto 20mg once a day with supper 

## 2023-08-07 NOTE — Progress Notes (Signed)
Primary Care Physician: Madelin Headings, MD Referring Physician: Dr. Mayford Knife  Primary EP: Dr Quintella Reichert is a 67 y.o. female with a h/o obesity, CAD, HTN, HLD, OSA who presents for follow up in the [facility]. Pt noted rapid heartbeat episode starting in July 2015. She had several tachypalpitation episodes since. Patient bought a Kardia mobile and documented atrial fibrillation in 2022. Patient has a CHA2DS2VASc score of 4. She is on diltiazem and metoprolol for rate control.   On follow up today, patient has noticed an increase in the frequency of her afib. ILR shows 0.1% burden and her symptoms correlate with her afib episodes. There are no specific triggers that she can identify.   Today, she denies symptoms of palpitations, chest pain, shortness of breath, orthopnea, PND, lower extremity edema, dizziness, presyncope, syncope, or neurologic sequela. The patient is tolerating medications without difficulties and is otherwise without complaint today.   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  Download 08/2014:  Good compliance, breakthru apnea on 10cm.  Changed to auto 5-12cm >> good control of AHI on f/u download ONO on CPAP:       Osteoarthritis    PCOS (polycystic ovarian syndrome)    PMB (postmenopausal bleeding)    SUI (stress urinary incontinence, female)    h/o   Wears glasses     Current Outpatient Medications  Medication Sig Dispense Refill   atorvastatin  (LIPITOR) 40 MG tablet Take 1 tablet (40 mg total) by mouth daily. 90 tablet 3   CALCIUM PO Take 1 tablet by mouth at bedtime.      chlorthalidone (HYGROTON) 25 MG tablet Take 1 tablet (25 mg total) by mouth daily. 90 tablet 3   denosumab (PROLIA) 60 MG/ML SOSY injection Inject 60 mg into the skin every 6 (six) months.     diltiazem (CARDIZEM CD) 180 MG 24 hr capsule Take 1 capsule (180 mg total) by mouth daily. 90 capsule 3   diltiazem (CARDIZEM) 30 MG tablet 1 tablet every 6 hours for palpitations 60 tablet 3   Doxepin HCl 3 MG TABS Take 1 tablet (3 mg total) by mouth at bedtime as needed. 90 tablet 1   estradiol (ESTRACE) 0.1 MG/GM vaginal cream INSERT 1 GM VAGINALLY TWICE WEEKLY 42.5 g 2   gabapentin (NEURONTIN) 300 MG capsule Take 300 mg by mouth daily.     GEMTESA 75 MG TABS Take 1 tablet by mouth daily as needed.     HYDROcodone bit-homatropine (HYCODAN) 5-1.5 MG/5ML syrup Take 5 mLs by mouth every 8 (eight) hours as needed for cough. 120 mL 0   ibuprofen (ADVIL) 800 MG tablet Take 1 tablet (800 mg total) by mouth every 8 (eight) hours as needed. 30 tablet 0   metoprolol succinate (TOPROL-XL) 50 MG 24 hr tablet Take 1 tablet (50 mg total) by mouth daily. 90 tablet 3  Multiple Vitamins-Minerals (PRESERVISION AREDS 2 PO) Take 1 tablet by mouth 2 (two) times daily.     rivaroxaban (XARELTO) 20 MG TABS tablet Take 1 tablet (20 mg total) by mouth daily with supper. 30 tablet 3   SYNTHROID 137 MCG tablet TAKE 1 TABLET DAILY (NEED TO SCHEDULE APPOINTMENT FOR MORE REFILLS. NEED TO KEEP UPCOMING APPOINTMENT) 90 tablet 3   tirzepatide (ZEPBOUND) 15 MG/0.5ML Pen Inject 15 mg into the skin once a week. 3 mL 2   No current facility-administered medications for this encounter.    ROS- All systems are reviewed and negative except as per the HPI above  Physical Exam: Vitals:   08/07/23 0857  BP: 118/88  Pulse: 75  Weight: 89.6 kg  Height: 4\' 11"  (1.499 m)    Wt Readings from Last 3  Encounters:  08/07/23 89.6 kg  07/31/23 89.1 kg  07/28/23 88 kg    GEN: Well nourished, well developed in no acute distress NECK: No JVD; No carotid bruits CARDIAC: Regular rate and rhythm, no murmurs, rubs, gallops RESPIRATORY:  Clear to auscultation without rales, wheezing or rhonchi  ABDOMEN: Soft, non-tender, non-distended EXTREMITIES:  No edema; No deformity    EKG today demonstrates SR Vent. rate 75 BPM PR interval 152 ms QRS duration 90 ms QT/QTcB 392/437 ms   CHA2DS2-VASc Score = 4  The patient's score is based upon: CHF History: 0 HTN History: 1 Diabetes History: 0 Stroke History: 0 Vascular Disease History: 1 (CAD on CT) Age Score: 1 Gender Score: 1       ASSESSMENT AND PLAN: Paroxysmal Atrial Fibrillation (ICD10:  I48.0) The patient's CHA2DS2-VASc score is 4, indicating a 4.8% annual risk of stroke.   ILR shows 0.1% afib burden with episodes occurring about once every other month. She is symptomatic with palpitations and fatigue.  We discussed her stroke risk and the risks and benefits of anticoagulation. Will start Xarelto 20 mg daily and stop ASA. For rhythm control, she is interested in afib ablation. She has lost ~50 lbs and her BMI is now <40. She plans to discuss with Dr Elberta Fortis at her upcoming office visit. Would avoid class IC with h/o CAD. Continue diltaizem 180 mg daily with 30 mg PRN q 4 hours for heart racing Continue metoprolol Toprol 50 mg daily  Secondary Hypercoagulable State (ICD10:  D68.69) The patient is at significant risk for stroke/thromboembolism based upon her CHA2DS2-VASc Score of 4.  Start Rivaroxaban (Xarelto).   OSA  Encouraged nightly CPAP Followed by Dr Mayford Knife  HTN Stable on current regimen  CAD CAC score 1272 No anginal symptoms On statin and ASA  Obesity Body mass index is 39.91 kg/m.  Encouraged lifestyle modification. Patient has done very well and lost ~50 lbs over the past few years.    Follow up with Dr  Elberta Fortis as scheduled.    Jorja Loa PA-C [facility] [address] [place], [place] [address] [phone]

## 2023-08-10 ENCOUNTER — Other Ambulatory Visit (HOSPITAL_COMMUNITY): Payer: Self-pay | Admitting: *Deleted

## 2023-08-10 MED ORDER — RIVAROXABAN 20 MG PO TABS: 20.0000 mg | ORAL_TABLET | Freq: Every day | ORAL | 2 refills | Status: DC

## 2023-08-14 ENCOUNTER — Other Ambulatory Visit (HOSPITAL_BASED_OUTPATIENT_CLINIC_OR_DEPARTMENT_OTHER): Payer: Self-pay | Admitting: Pulmonary Disease

## 2023-08-18 NOTE — Progress Notes (Unsigned)
[name] [address] [place] [address] Phone: [phone] Subjective:   Nichole Smith, am serving as a scribe for Dr. Antoine Primas.  I'm seeing this patient by the request  of:  Panosh, Neta Mends, MD  CC: Back pain follow-up, right shoulder pain follow-up  [id number]  06/03/2023 Concern is more secondary to the radicular symptoms.  Think it is secondary to the spinal stenosis that has been seen on MRI previously.  Has responded to epidurals previously but now having some weakness.  Will try an epidural again to see how patient responds.  Having more of a foot drop send will try the L5-S1 area.  Depending on findings we would need to suggest the possibility of nerve conduction studies to keep having some difficulty.  Follow-up with me again in 2 to 3 months after the injection    Update 08/19/2023 Nichole Smith is a 68 y.o. female coming in with complaint of lumbar spine pain. Epidural 06/18/2023. Patient states that the epidural was effective for 4 weeks. Wants to know how to cope between epidural injections. She does not want another one at this time. Back pain feels like a cramp and as the day progresses her pain also progresses. Pain in R lumbar spine. L hamstring and L knee also painful making it hard to put on a shoe and get in and out of car. Taking gabapentin 300mg  3x a day which has helped the L leg pain. Last week pain was sharp in L leg.   Falling less and foot drop seems to have subsided.   Cane 4 prong   Reviewing patient's chart has been diagnosed with atrial fibrillation and is now on Xarelto.    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  Download 08/2014:  Good compliance, breakthru apnea on 10cm.  Changed to auto 5-12cm >> good control of AHI on f/u download ONO on CPAP:       Osteoarthritis    PCOS (polycystic ovarian syndrome)    PMB (postmenopausal bleeding)    SUI (stress urinary incontinence, female)    h/o   Wears glasses    Past Surgical History:  Procedure Laterality Date   ANTERIOR CERVICAL DECOMP/DISCECTOMY FUSION  12/24/2017   Procedure: Cervical five-six Cervical six-seven Anterior cervical decompression/discectomy/fusion;  Surgeon: Maeola Harman, MD;  Location: [facility] OR;  Service: Neurosurgery;;   CARPAL TUNNEL RELEASE Left 12/24/2017   Procedure: LEFT CARPAL TUNNEL RELEASE;  Surgeon: Maeola Harman, MD;  Location: [facility] OR;  Service: Neurosurgery;  Laterality: Left;   CESAREAN SECTION     x 1 - twins   COLONOSCOPY  2021   DILATATION & CURRETTAGE/HYSTEROSCOPY WITH RESECTOCOPE N/A 07/18/2013   Procedure: DILATATION & CURETTAGE/HYSTEROSCOPY WITH RESECTOCOPE;  Surgeon: Annamaria Boots, MD;  Location: [facility];  Service: Gynecology;  Laterality: N/A;  mass resection   HYSTEROSCOPY WITH D & C N/A 05/05/2023  Procedure: DILATATION AND CURETTAGE /HYSTEROSCOPY;  Surgeon: Jerene Bears, MD;  Location: [facility];  Service: Gynecology;  Laterality: N/A;   implantable loop recorder placement  05/20/2021   Medtronic Reveal Linq model [id number] implantable loop recorder (SN [id number])   Lung tumor removed  2012   Rt lung carcinoid   MOUTH SURGERY     pre-cancerous ulcer removed   POLYPECTOMY     SHOULDER OPEN ROTATOR CUFF REPAIR  07/20/2010   Right   WISDOM TOOTH EXTRACTION     Social History   Socioeconomic History   Marital status: Married    Spouse name: Amada Jupiter   Number of children: 2   Years of education: Not on  file   Highest education level: Bachelor's degree (e.g., BA, AB, BS)  Occupational History   Occupation: retired    Associate Professor: Museum/gallery exhibitions officer  Tobacco Use   Smoking status: Never   Smokeless tobacco: Never   Tobacco comments:    Never smoked 08/07/23  Vaping Use   Vaping status: Never Used  Substance and Sexual Activity   Alcohol use: Not Currently   Drug use: No   Sexual activity: Not Currently    Partners: Male    Birth control/protection: Post-menopausal  Other Topics Concern   Not on file  Social History Narrative   hhof 2    2 Children at college and  beyond      [employer]    Married    Retired age 75 2015   Recently moved taking care of grandchildren during the week.   Social Determinants of Health   Financial Resource Strain: Low Risk  (11/12/2022)   Overall Financial Resource Strain (CARDIA)    Difficulty of Paying Living Expenses: Not hard at all  Food Insecurity: No Food Insecurity (11/12/2022)   Hunger Vital Sign    Worried About Running Out of Food in the Last Year: Never true    Ran Out of Food in the Last Year: Never true  Transportation Needs: No Transportation Needs (11/12/2022)   PRAPARE - Administrator, Civil Service (Medical): No    Lack of Transportation (Non-Medical): No  Physical Activity: Insufficiently Active (11/12/2022)   Exercise Vital Sign    Days of Exercise per Week: 5 days    Minutes of Exercise per Session: 20 min  Stress: No Stress Concern Present (11/12/2022)   [employer] of Occupational Health - Occupational Stress Questionnaire    Feeling of Stress : Not at all  Social Connections: Socially Integrated (11/12/2022)   Social Connection and Isolation Panel [NHANES]    Frequency of Communication with Friends and Family: More than three times a week    Frequency of Social Gatherings with Friends and Family: More than three times a week    Attends Religious Services: More than 4 times per year     Active Member of [employer] or Organizations: Yes    Attends Engineer, structural: More than 4 times per year    Marital Status: Married   Allergies  Allergen Reactions   Neomycin Other (See Comments)    Inflammation--eye drop    Ace Inhibitors Cough   Family History  Problem Relation Age of Onset   Stroke Mother        died age 31   Hypertension Mother    Sudden death Mother    Alcoholism Mother    Coronary artery disease Father        died age  52   Hypertension Father    Liver disease Father    Alcohol abuse Father    Obesity Father    Parkinson's disease Brother    Diabetes Brother    Hypertension Brother    Hypertension Maternal Uncle    Colon cancer Neg Hx    Rectal cancer Neg Hx    Stomach cancer Neg Hx    Colon polyps Neg Hx    Esophageal cancer Neg Hx     Current Outpatient Medications (Endocrine & Metabolic):    denosumab (PROLIA) 60 MG/ML SOSY injection, Inject 60 mg into the skin every 6 (six) months.   SYNTHROID 137 MCG tablet, TAKE 1 TABLET DAILY (NEED TO SCHEDULE APPOINTMENT FOR MORE REFILLS. NEED TO KEEP UPCOMING APPOINTMENT)  Current Outpatient Medications (Cardiovascular):    atorvastatin (LIPITOR) 40 MG tablet, Take 1 tablet (40 mg total) by mouth daily.   chlorthalidone (HYGROTON) 25 MG tablet, Take 1 tablet (25 mg total) by mouth daily.   diltiazem (CARDIZEM CD) 180 MG 24 hr capsule, Take 1 capsule (180 mg total) by mouth daily.   diltiazem (CARDIZEM) 30 MG tablet, 1 tablet every 6 hours for palpitations   metoprolol succinate (TOPROL-XL) 50 MG 24 hr tablet, Take 1 tablet (50 mg total) by mouth daily.  Current Outpatient Medications (Respiratory):    HYDROcodone bit-homatropine (HYCODAN) 5-1.5 MG/5ML syrup, Take 5 mLs by mouth every 8 (eight) hours as needed for cough.  Current Outpatient Medications (Analgesics):    ibuprofen (ADVIL) 800 MG tablet, Take 1 tablet (800 mg total) by mouth every 8 (eight) hours as needed.  Current  Outpatient Medications (Hematological):    rivaroxaban (XARELTO) 20 MG TABS tablet, Take 1 tablet (20 mg total) by mouth daily with supper.  Current Outpatient Medications (Other):    AMBULATORY NON FORMULARY MEDICATION, 1 Units by Other route once for 1 dose.   CALCIUM PO, Take 1 tablet by mouth at bedtime.    Doxepin HCl 3 MG TABS, TAKE 1 TABLET (3 MG TOTAL) BY MOUTH AT BEDTIME AS NEEDED.   estradiol (ESTRACE) 0.1 MG/GM vaginal cream, INSERT 1 GM VAGINALLY TWICE WEEKLY   gabapentin (NEURONTIN) 300 MG capsule, Take 300 mg by mouth daily.   GEMTESA 75 MG TABS, Take 1 tablet by mouth daily as needed.   Multiple Vitamins-Minerals (PRESERVISION AREDS 2 PO), Take 1 tablet by mouth 2 (two) times daily.   tirzepatide (ZEPBOUND) 15 MG/0.5ML Pen, Inject 15 mg into the skin once a week.   Reviewed prior external information including notes and imaging from  primary care provider As well as notes that were available from care everywhere and other healthcare systems.  Past medical history, social, surgical and family history all reviewed in electronic medical record.  No pertanent information unless stated regarding to the chief complaint.   Review of Systems:  No headache, visual changes, nausea, vomiting, diarrhea, constipation, dizziness, abdominal pain, skin rash, fevers, chills, night sweats, weight loss, swollen lymph nodes, body aches, joint swelling, chest pain, shortness of breath, mood changes. POSITIVE muscle aches  Objective  Blood pressure 122/86, pulse 68, height 4\' 11"  (1.499 m), weight 198 lb (89.8 kg), last menstrual period 02/12/2011, SpO2 96%.   General: No apparent distress alert and oriented x3 mood and affect normal, dressed appropriately.  HEENT: Pupils equal, extraocular movements intact  Respiratory: Patient's speak in full sentences and does not appear short of breath  Cardiovascular: No lower extremity edema, non tender, no erythema  Low back exam shows significant  loss  of lordosis noted.  Severe tenderness to palpation over the greater trochanteric area on the left side.  Negative straight leg test but does have worsening pain with extension of the back.    After verbal consent patient was prepped with alcohol swab and with a 21-gauge 2 inch needle injected into the left greater trochanteric area with 2 cc of 0.5% Marcaine and 1 cc of Kenalog 40 mg/mL.  No blood loss.  Band-Aid placed.  Postinjection instructions given   Impression and Recommendations:    The above documentation has been reviewed and is accurate and complete Judi Saa, DO

## 2023-08-19 ENCOUNTER — Encounter: Payer: Self-pay | Admitting: Family Medicine

## 2023-08-19 ENCOUNTER — Ambulatory Visit (INDEPENDENT_AMBULATORY_CARE_PROVIDER_SITE_OTHER): Payer: Medicare Other | Admitting: Family Medicine

## 2023-08-19 VITALS — BP 122/86 | HR 68 | Ht 59.0 in | Wt 198.0 lb

## 2023-08-19 DIAGNOSIS — M7062 Trochanteric bursitis, left hip: Secondary | ICD-10-CM | POA: Diagnosis not present

## 2023-08-19 DIAGNOSIS — M5416 Radiculopathy, lumbar region: Secondary | ICD-10-CM | POA: Diagnosis not present

## 2023-08-19 MED ORDER — AMBULATORY NON FORMULARY MEDICATION: 1.0000 [IU] | Freq: Once | 0 refills | Status: AC

## 2023-08-19 NOTE — Patient Instructions (Addendum)
[facility] [address] [place], [place] [address] [phone]  [facility]-Does not file insurance [address] [place], [place] [address] [phone]  Psoas stretch  Think about naltrexone See me in 3-4 weeks

## 2023-08-19 NOTE — Assessment & Plan Note (Addendum)
Lumbar radiculopathy secondary to the spinal stenosis.  Has responded fairly well to the epidurals.  We discussed with patient about icing regimen and home exercises, which activities to do and which ones to avoid.  Increase activity slowly.  Follow-up again in 6 to 8 weeks otherwise. Discussed naltraxone

## 2023-08-19 NOTE — Assessment & Plan Note (Signed)
Chronic problem with exacerbation.  Likely compensating for more of the spinal stenosis.  Discussed which activities to do and which ones to avoid.  Increase activity slowly.  Follow-up again in 6 to 8 weeks

## 2023-08-24 ENCOUNTER — Ambulatory Visit: Payer: Medicare Other

## 2023-08-24 ENCOUNTER — Ambulatory Visit (INDEPENDENT_AMBULATORY_CARE_PROVIDER_SITE_OTHER): Payer: Medicare Other

## 2023-08-24 DIAGNOSIS — Z23 Encounter for immunization: Secondary | ICD-10-CM | POA: Diagnosis not present

## 2023-08-24 DIAGNOSIS — I480 Paroxysmal atrial fibrillation: Secondary | ICD-10-CM

## 2023-08-25 ENCOUNTER — Encounter: Payer: Self-pay | Admitting: Pulmonary Disease

## 2023-08-27 ENCOUNTER — Encounter (HOSPITAL_BASED_OUTPATIENT_CLINIC_OR_DEPARTMENT_OTHER): Payer: Self-pay | Admitting: Obstetrics & Gynecology

## 2023-08-27 ENCOUNTER — Ambulatory Visit (HOSPITAL_BASED_OUTPATIENT_CLINIC_OR_DEPARTMENT_OTHER): Payer: Medicare Other | Admitting: Obstetrics & Gynecology

## 2023-08-27 VITALS — BP 125/63 | HR 67 | Ht 59.0 in | Wt 191.2 lb

## 2023-08-27 DIAGNOSIS — N3281 Overactive bladder: Secondary | ICD-10-CM | POA: Diagnosis not present

## 2023-08-27 DIAGNOSIS — R399 Unspecified symptoms and signs involving the genitourinary system: Secondary | ICD-10-CM

## 2023-08-27 DIAGNOSIS — N390 Urinary tract infection, site not specified: Secondary | ICD-10-CM | POA: Diagnosis not present

## 2023-08-27 DIAGNOSIS — Z9189 Other specified personal risk factors, not elsewhere classified: Secondary | ICD-10-CM | POA: Diagnosis not present

## 2023-08-27 DIAGNOSIS — M810 Age-related osteoporosis without current pathological fracture: Secondary | ICD-10-CM

## 2023-08-27 DIAGNOSIS — B009 Herpesviral infection, unspecified: Secondary | ICD-10-CM

## 2023-08-27 MED ORDER — NITROFURANTOIN MONOHYD MACRO 100 MG PO CAPS
100.0000 mg | ORAL_CAPSULE | Freq: Every day | ORAL | 3 refills | Status: DC
Start: 2023-08-27 — End: 2023-10-19

## 2023-08-27 MED ORDER — ESTRADIOL 0.1 MG/GM VA CREA: TOPICAL_CREAM | VAGINAL | 3 refills | Status: DC

## 2023-08-27 NOTE — Progress Notes (Signed)
67 y.o. G2P2 Married White or Caucasian female here for breast and pelvic exam.  I am also following her for history of recurrent PMP bleeding.  Denies any vaginal bleeding.   She is on prolia and has done 4 injections.  Follow up is scheduled.    H/o recurrent UTIs.  On macrobid 100mg  daily.  Needs RF.  Patient's last menstrual period was 02/12/2011.           Health Maintenance: PCP:  Dr. Fabian Sharp.  Last wellness appt was 01/2023.  Did blood work at that appt:  yes Vaccines are up to date:  yes Colonoscopy:  09/25/2020, follow up 3 years.  Has appt for this. MMG:  08/26/2022 Negative BMD:  08/22/2021 Last pap smear:  12/26/2022 Negative.   H/o abnormal pap smear:  no    reports that she has never smoked. She has never used smokeless tobacco. She reports that she does not currently use alcohol. She reports that she does not use drugs.  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  Download 08/2014:  Good compliance, breakthru apnea on 10cm.  Changed to auto 5-12cm >> good control of AHI on f/u download ONO on CPAP:       Osteoarthritis    PCOS (polycystic ovarian syndrome)    PMB (postmenopausal bleeding)    SUI (stress urinary incontinence, female)    h/o   Wears glasses     Past Surgical History:  Procedure Laterality Date   ANTERIOR CERVICAL DECOMP/DISCECTOMY FUSION  12/24/2017   Procedure: Cervical five-six Cervical six-seven Anterior cervical decompression/discectomy/fusion;  Surgeon: Maeola Harman, MD;   Location: [facility] OR;  Service: Neurosurgery;;   CARPAL TUNNEL RELEASE Left 12/24/2017   Procedure: LEFT CARPAL TUNNEL RELEASE;  Surgeon: Maeola Harman, MD;  Location: [facility] OR;  Service: Neurosurgery;  Laterality: Left;   CATARACT EXTRACTION Bilateral    Right eye 08/24/2023   CESAREAN SECTION     x 1 - twins   COLONOSCOPY  2021   DILATATION & CURRETTAGE/HYSTEROSCOPY WITH RESECTOCOPE N/A 07/18/2013   Procedure: DILATATION & CURETTAGE/HYSTEROSCOPY WITH RESECTOCOPE;  Surgeon: Annamaria Boots, MD;  Location: [facility] ORS;  Service: Gynecology;  Laterality: N/A;  mass resection   HYSTEROSCOPY WITH D & C N/A 05/05/2023   Procedure: DILATATION AND CURETTAGE /HYSTEROSCOPY;  Surgeon: Jerene Bears, MD;  Location: [facility];  Service: Gynecology;  Laterality: N/A;   implantable loop recorder placement  05/20/2021   Medtronic Reveal Linq model [id number] implantable loop recorder (SN [id number])   Lung tumor removed  2012   Rt lung carcinoid   MOUTH SURGERY     pre-cancerous ulcer removed   POLYPECTOMY     SHOULDER OPEN ROTATOR CUFF REPAIR  07/20/2010   Right   WISDOM TOOTH EXTRACTION      Current Outpatient Medications  Medication Sig Dispense Refill   atorvastatin (LIPITOR) 40 MG tablet Take 1 tablet (40 mg total)  by mouth daily. 90 tablet 3   CALCIUM PO Take 1 tablet by mouth at bedtime.      chlorthalidone (HYGROTON) 25 MG tablet Take 1 tablet (25 mg total) by mouth daily. 90 tablet 3   denosumab (PROLIA) 60 MG/ML SOSY injection Inject 60 mg into the skin every 6 (six) months.     diltiazem (CARDIZEM CD) 180 MG 24 hr capsule Take 1 capsule (180 mg total) by mouth daily. 90 capsule 3   diltiazem (CARDIZEM) 30 MG tablet 1 tablet every 6 hours for palpitations 60 tablet 3   Doxepin HCl 3 MG TABS TAKE 1 TABLET (3 MG TOTAL) BY MOUTH AT BEDTIME AS NEEDED. 30 tablet 2   estradiol (ESTRACE) 0.1 MG/GM vaginal cream INSERT 1 GM VAGINALLY TWICE WEEKLY 42.5 g 2   gabapentin (NEURONTIN) 300 MG  capsule Take 300 mg by mouth daily.     GEMTESA 75 MG TABS Take 1 tablet by mouth daily as needed.     HYDROcodone bit-homatropine (HYCODAN) 5-1.5 MG/5ML syrup Take 5 mLs by mouth every 8 (eight) hours as needed for cough. 120 mL 0   ibuprofen (ADVIL) 800 MG tablet Take 1 tablet (800 mg total) by mouth every 8 (eight) hours as needed. 30 tablet 0   metoprolol succinate (TOPROL-XL) 50 MG 24 hr tablet Take 1 tablet (50 mg total) by mouth daily. 90 tablet 3   Multiple Vitamins-Minerals (PRESERVISION AREDS 2 PO) Take 1 tablet by mouth 2 (two) times daily.     rivaroxaban (XARELTO) 20 MG TABS tablet Take 1 tablet (20 mg total) by mouth daily with supper. 90 tablet 2   SYNTHROID 137 MCG tablet TAKE 1 TABLET DAILY (NEED TO SCHEDULE APPOINTMENT FOR MORE REFILLS. NEED TO KEEP UPCOMING APPOINTMENT) 90 tablet 3   tirzepatide (ZEPBOUND) 15 MG/0.5ML Pen Inject 15 mg into the skin once a week. 3 mL 2   No current facility-administered medications for this visit.    Family History  Problem Relation Age of Onset   Stroke Mother        died age 58   Hypertension Mother    Sudden death Mother    Alcoholism Mother    Coronary artery disease Father        died age 63   Hypertension Father    Liver disease Father    Alcohol abuse Father    Obesity Father    Parkinson's disease Brother    Diabetes Brother    Hypertension Brother    Hypertension Maternal Uncle    Colon cancer Neg Hx    Rectal cancer Neg Hx    Stomach cancer Neg Hx    Colon polyps Neg Hx    Esophageal cancer Neg Hx     Review of Systems  Constitutional: Negative.   Genitourinary: Negative.     Exam:   BP 125/63 (BP Location: Left Arm, Patient Position: Sitting, Cuff Size: Large)   Pulse 67   Ht 4\' 11"  (1.499 m)   Wt 191 lb 3.2 oz (86.7 kg)   LMP 02/12/2011   BMI 38.62 kg/m   Height: 4\' 11"  (149.9 cm)  General appearance: alert, cooperative and appears stated age Breasts: normal appearance, no masses or  tenderness Abdomen: soft, non-tender; bowel sounds normal; no masses,  no organomegaly Lymph nodes: Cervical, supraclavicular, and axillary nodes normal.  No abnormal inguinal nodes palpated Neurologic: Grossly normal  Pelvic: External genitalia:  no lesions  Urethra:  normal appearing urethra with no masses, tenderness or lesions              Bartholins and Skenes: normal                 Vagina: normal appearing vagina with atrophic changes and no discharge, no lesions              Cervix: no lesions              Pap taken: No. Bimanual Exam:  Uterus:  normal size, contour, position, consistency, mobility, non-tender              Adnexa: normal adnexa and no mass, fullness, tenderness               Rectovaginal: Confirms               Anus:  normal sphincter tone, no lesions  Chaperone, Ina Homes, CMA, was present for exam.  Assessment/Plan: 1. GYN exam for high-risk Medicare patient - Pap smear 12/2022 - Mammogram 08/2022 - Colonoscopy is due.  Pt has appt scheduled. - Bone mineral density is scheduled. - lab work done with PCP, Dr. Fabian Sharp - vaccines reviewed/updated  2. OAB (overactive bladder) - Ambulatory referral to Urogynecology  3. Recurrent UTI - estradiol (ESTRACE) 0.1 MG/GM vaginal cream; INSERT 1 GM VAGINALLY TWICE WEEKLY  Dispense: 42.5 g; Refill: 3  4. Urinary tract infection symptoms - nitrofurantoin, macrocrystal-monohydrate, (MACROBID) 100 MG capsule; Take 1 capsule (100 mg total) by mouth daily.  Dispense: 90 capsule; Refill: 3  5. Age-related osteoporosis without current pathological fracture - on prolia - has BMD scheduled  6. 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

## 2023-09-04 ENCOUNTER — Encounter: Payer: Self-pay | Admitting: Cardiology

## 2023-09-04 ENCOUNTER — Ambulatory Visit: Payer: Medicare Other | Attending: Cardiology | Admitting: Cardiology

## 2023-09-04 VITALS — BP 130/74 | HR 72 | Ht 59.0 in | Wt 195.2 lb

## 2023-09-04 DIAGNOSIS — I10 Essential (primary) hypertension: Secondary | ICD-10-CM | POA: Insufficient documentation

## 2023-09-04 DIAGNOSIS — D6869 Other thrombophilia: Secondary | ICD-10-CM | POA: Diagnosis present

## 2023-09-04 DIAGNOSIS — I480 Paroxysmal atrial fibrillation: Secondary | ICD-10-CM | POA: Diagnosis present

## 2023-09-04 NOTE — Progress Notes (Signed)
Electrophysiology Office Note:   Date:  09/04/2023  ID:  Nichole, Smith [birth year], MRN [id number]  Primary Cardiologist: Armanda Magic, MD Electrophysiologist: Regan Lemming, MD      History of Present Illness:   Nichole Smith is a 67 y.o. 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   Discussed the use of AI scribe software for clinical note transcription with the patient, who gave verbal consent to proceed.  History of Present Illness          she denies chest pain, palpitations, dyspnea, PND, orthopnea, nausea, vomiting, dizziness, syncope, edema, weight gain, or early satiety.   Review of systems complete and found to be negative unless listed in HPI.   Device History: Medtronic loop recorder implanted for Atrial fibrillation  EP Information / Studies Reviewed:    EKG is not ordered today. EKG from 9//24 reviewed which showed sinus rhythm        Risk Assessment/Calculations:    CHA2DS2-VASc Score = 4   This indicates a 4.8% annual risk of stroke. The patient's score is based upon: CHF History: 0 HTN History: 1 Diabetes History: 0 Stroke History: 0 Vascular Disease History: 1 (CAD on CT) Age Score: 1 Gender Score: 1             Physical Exam:   VS:  BP 130/74 (BP Location: Right Arm, Patient Position: Sitting, Cuff Size: Large)   Pulse 72   Ht 4\' 11"  (1.499 m)   Wt 195 lb 3.2 oz (88.5 kg)   LMP 02/12/2011   SpO2 96%   BMI 39.43 kg/m    Wt Readings from Last 3 Encounters:  09/04/23 195 lb 3.2 oz (88.5 kg)  08/27/23 191 lb 3.2 oz (86.7 kg)  08/19/23 198 lb (89.8 kg)     GEN: Well nourished, well developed in no acute distress NECK: No JVD; No carotid bruits CARDIAC: Regular rate and rhythm, no murmurs, rubs, gallops RESPIRATORY:  Clear to  auscultation without rales, wheezing or rhonchi  ABDOMEN: Soft, non-tender, non-distended EXTREMITIES:  No edema; No deformity   ILR Interrogation- reviewed in detail today,  See PACEART report  ASSESSMENT AND PLAN:    Atrial fibrillation s/p Medtronic Loop recorder Normal device function See Pace Art report No changes today A-fib burden 0.1%.  2.  Hypertension: Currently well-controlled  3.  Obstructive sleep apnea: CPAP compliance encouraged  4.  Morbid obesity: Lifestyle modification encouraged  5.  Secondary hypercoagulable state: Currently on Xarelto for atrial fibrillation.  A-fib burden is low, could consider stopping anticoagulation if she continues to have such short episodes.     Follow up with EP APP in 12 months  Signed, Asucena Galer Jorja Loa, MD

## 2023-09-07 ENCOUNTER — Ambulatory Visit (INDEPENDENT_AMBULATORY_CARE_PROVIDER_SITE_OTHER): Payer: Medicare Other | Admitting: Physician Assistant

## 2023-09-08 ENCOUNTER — Ambulatory Visit (INDEPENDENT_AMBULATORY_CARE_PROVIDER_SITE_OTHER): Payer: Medicare Other | Admitting: Physician Assistant

## 2023-09-08 ENCOUNTER — Encounter (INDEPENDENT_AMBULATORY_CARE_PROVIDER_SITE_OTHER): Payer: Self-pay | Admitting: Physician Assistant

## 2023-09-08 VITALS — BP 99/66 | HR 70 | Temp 98.0°F | Ht 59.0 in | Wt 189.0 lb

## 2023-09-08 DIAGNOSIS — R632 Polyphagia: Secondary | ICD-10-CM

## 2023-09-08 DIAGNOSIS — R7303 Prediabetes: Secondary | ICD-10-CM | POA: Diagnosis not present

## 2023-09-08 DIAGNOSIS — E669 Obesity, unspecified: Secondary | ICD-10-CM | POA: Diagnosis not present

## 2023-09-08 DIAGNOSIS — I2510 Atherosclerotic heart disease of native coronary artery without angina pectoris: Secondary | ICD-10-CM | POA: Diagnosis not present

## 2023-09-08 DIAGNOSIS — Z6838 Body mass index (BMI) 38.0-38.9, adult: Secondary | ICD-10-CM

## 2023-09-08 MED ORDER — TOPIRAMATE 25 MG PO TABS
25.0000 mg | ORAL_TABLET | Freq: Every day | ORAL | 0 refills | Status: DC
Start: 2023-09-08 — End: 2023-10-19

## 2023-09-08 MED ORDER — ZEPBOUND 15 MG/0.5ML ~~LOC~~ SOAJ
15.0000 mg | SUBCUTANEOUS | 2 refills | Status: DC
Start: 2023-09-08 — End: 2023-09-09

## 2023-09-08 NOTE — Progress Notes (Signed)
[facility] Summary Report / Loop Recorder 

## 2023-09-08 NOTE — Progress Notes (Unsigned)
.smr  Office: [phone]  /  Fax: [phone]  WEIGHT SUMMARY AND BIOMETRICS  Vitals Temp: 98 F (36.7 C) BP: 99/66 Pulse Rate: 70 SpO2: 94 %   Anthropometric Measurements Height: 4\' 11"  (1.499 m) Weight: 189 lb (85.7 kg) BMI (Calculated): 38.15 Weight at Last Visit: 194 lb Weight Lost Since Last Visit: 5 lb Weight Gained Since Last Visit: 0 Starting Weight: 238 lb Total Weight Loss (lbs): 49 lb (22.2 kg) Peak Weight: 238 lb   Body Composition  Body Fat %: 48.1 % Fat Mass (lbs): 91.2 lbs Muscle Mass (lbs): 93.6 lbs Total Body Water (lbs): 71.6 lbs Visceral Fat Rating : 16   Other Clinical Data Fasting: no Labs: no Today's Visit #: 29 Starting Date: 01/12/19     HPI  Chief Complaint: OBESITY  Nichole Smith is here to discuss her progress with her obesity treatment plan. She is on the keeping a food journal and adhering to recommended goals of 1200-1300 calories and 90 grams of protein and states she is following her eating plan approximately 85 % of the time. She states she is exercising walking/water aerobics/weights 40 minutes 7 times per week.  Discussed the use of AI scribe software for clinical note transcription with the patient, who gave verbal consent to proceed.  History of Present Illness /      Interval History:  Since last office visit she is down 5 lbs.  Down 49 lbs overall. TBW loss of 20.6%  The patient, with a history of obesity, polyphagia, prediabetes, and hypertension, presents for a follow-up visit regarding her obesity treatment plan.  Significant weight loss of 49 pounds since the initiation of her treatment plan. The patient attributes her success to a strict adherence to her nutrition plan and exercise regimen, despite occasional days of overeating due to social events or celebrations. She notes that she still experiences hunger, even while on Zepbound, and expresses frustration at this persistent symptom. The patient also mentions a planned  vacation and expresses a desire to maintain her diet during this time. She is considering the addition of Topamax to her treatment regimen to help manage her hunger.  The patient is due for cataract surgery in the coming month, which she anticipates may affect her routine and progress.   Pharmacotherapy: Zepbound 15 mg weekly. Denies mass in neck, dysphagia, dyspepsia, persistent hoarseness, abdominal pain, or N/V/Constipation or diarrhea. Has annual eye exam. Mood is stable.  Injectable Hx: Trulicity 4.5mg  replaced with Zepbound 10mg  on 11/12/2022 Zepbound 10mg  replaced with Zepbound 12.5mg  on 02/18/2023 Zepbound 12.5 mg replaced with Zepbound 15 mg on 06/29/23   Still cravings/hunger especially for sweets. Patient denies history of glaucoma or kidney stones. She was informed of side effects and that topiramate is teratogenic. She is post menopausal.  She is agreeable to start topamax at this time.    TREATMENT PLAN FOR OBESITY:  Recommended Dietary Goals  Nichole Smith is currently in the action stage of change. As such, her goal is to continue weight management plan. She has agreed to keeping a food journal and adhering to recommended goals of 1200-130090  calories and 90 grams of protein.  Behavioral Intervention  We discussed the following Behavioral Modification Strategies today: continue to work on maintaining a reduced calorie state, getting the recommended amount of protein, incorporating whole foods, making healthy choices, staying well hydrated and practicing mindfulness when eating..  Additional resources provided today: NA  Recommended Physical Activity Goals  Nichole Smith has been advised to work up to 150  minutes of moderate intensity aerobic activity a week and strengthening exercises 2-3 times per week for cardiovascular health, weight loss maintenance and preservation of muscle mass.   She has agreed to Think about enjoyable ways to increase daily physical activity and overcoming  barriers to exercise and Increase physical activity in their day and reduce sedentary time (increase NEAT).   Pharmacotherapy We discussed various medication options to help Nichole Smith with her weight loss efforts and we both agreed to continue Zepbound 15 mg weekly and to start topamax for cravings.    Return in about 4 weeks (around 10/06/2023).[name] She was informed of the importance of frequent follow up visits to maximize her success with intensive lifestyle modifications for her multiple health conditions.  PHYSICAL EXAM:  Blood pressure 99/66, pulse 70, temperature 98 F (36.7 C), height 4\' 11"  (1.499 m), weight 189 lb (85.7 kg), last menstrual period 02/12/2011, SpO2 94%. Body mass index is 38.17 kg/m.  General: She is overweight, cooperative, alert, well developed, and in no acute distress. PSYCH: Has normal mood, affect and thought process.   Lungs: Normal breathing effort, no conversational dyspnea.  DIAGNOSTIC DATA REVIEWED:  BMET    Component Value Date/Time   NA 140 05/05/2023 1118   NA 141 02/18/2023 0913   K 3.6 05/05/2023 1118   CL 100 05/05/2023 1118   CO2 29 02/18/2023 0913   GLUCOSE 96 05/05/2023 1118   BUN 23 05/05/2023 1118   BUN 20 02/18/2023 0913   CREATININE 0.60 05/05/2023 1118   CALCIUM 9.6 02/18/2023 0913   GFRNONAA 91 01/22/2021 1047   GFRAA 105 01/22/2021 1047   Lab Results  Component Value Date   HGBA1C 5.6 02/18/2023   HGBA1C 5.9 06/07/2018   Lab Results  Component Value Date   INSULIN 13.8 02/18/2023   INSULIN 20.4 01/12/2019   Lab Results  Component Value Date   TSH 2.70 08/18/2022   CBC    Component Value Date/Time   WBC 9.4 05/05/2023 1115   RBC 5.07 05/05/2023 1115   HGB 16.0 (H) 05/05/2023 1118   HGB 15.9 06/27/2020 1123   HGB 15.4 03/13/2014 1422   HCT 47.0 (H) 05/05/2023 1118   HCT 47.9 (H) 06/27/2020 1123   HCT 45.3 03/13/2014 1422   PLT 250 05/05/2023 1115   PLT 243 06/27/2020 1123   MCV 92.9 05/05/2023 1115   MCV 94  06/27/2020 1123   MCV 89.0 03/13/2014 1422   MCH 33.1 05/05/2023 1115   MCHC 35.7 05/05/2023 1115   RDW 13.1 05/05/2023 1115   RDW 12.1 06/27/2020 1123   RDW 13.2 03/13/2014 1422   Iron Studies No results found for: "IRON", "TIBC", "FERRITIN", "IRONPCTSAT" Lipid Panel     Component Value Date/Time   CHOL 119 08/18/2022 0804   CHOL 148 10/03/2021 0806   CHOL 110 11/03/2016 0000   TRIG 46.0 08/18/2022 0804   TRIG 54 11/03/2016 0000   HDL 52.70 08/18/2022 0804   HDL 58 10/03/2021 0806   HDL 48 (L) 11/03/2016 0000   CHOLHDL 2 08/18/2022 0804   VLDL 9.2 08/18/2022 0804   LDLCALC 57 08/18/2022 0804   LDLCALC 76 10/03/2021 0806   LDLCALC 49 11/03/2016 0000   Hepatic Function Panel     Component Value Date/Time   PROT 6.9 02/18/2023 0913   ALBUMIN 4.1 02/18/2023 0913   AST 29 02/18/2023 0913   ALT 36 (H) 02/18/2023 0913   ALKPHOS 71 02/18/2023 0913   BILITOT 0.7 02/18/2023 0913   BILIDIR 0.1  08/18/2022 0804   IBILI 0.4 09/08/2016 0749      Component Value Date/Time   TSH 2.70 08/18/2022 0804   Nutritional Lab Results  Component Value Date   VD25OH 62.7 02/18/2023   VD25OH 52.5 09/16/2022   VD25OH 42.3 05/13/2022    ASSOCIATED CONDITIONS ADDRESSED TODAY  ASSESSMENT AND PLAN  Problem List Items Addressed This Visit     Essential hypertension   Prediabetes   Polyphagia - Primary   Relevant Medications   tirzepatide (ZEPBOUND) 15 MG/0.5ML Pen   topiramate (TOPAMAX) 25 MG tablet   Obesity, Starting BMI 46.48   Relevant Medications   tirzepatide (ZEPBOUND) 15 MG/0.5ML Pen   Polyphagia Currently this is {EWCONTROLASSESSMENT:24261}. Medication(s): {dwwpharmacotherapy:29109}.  Reported side effects: {dwwse:29122}  Plan: {dwwmed:29123} {dwwpharmacotherapy:29109}  Prediabetes Last A1c was ***  Medication(s): {dwwpharmacotherapy:29109} Polyphagia:{dwwyes:29172} Lab Results  Component Value Date   HGBA1C 5.6 02/18/2023   HGBA1C 5.7 08/18/2022   HGBA1C 5.5  05/13/2022   HGBA1C 5.5 10/03/2021   HGBA1C 5.3 06/25/2021   Lab Results  Component Value Date   INSULIN 13.8 02/18/2023   INSULIN 22.2 09/16/2022   INSULIN 12.2 05/13/2022   INSULIN 13.6 10/03/2021   INSULIN 12.2 06/25/2021    Plan: {dwwmed:29123} {dwwpharmacotherapy:29109}   .Obesity Significant weight loss of 49 pounds. Polyphagia and cravings, particularly for sugar, persist despite treatment with Tirzepatide. -Continue Tirzepatide injections weekly. -Initiate Topiramate 25mg  at bedtime, increase to 50mg  after 1 week if well-tolerated.  Prediabetes Elevated insulin levels indicating insulin resistance. Improvement noted with Tirzepatide treatment. -Continue Tirzepatide. -Check fasting labs including insulin level at next visit.    Follow-up Plans -Return to clinic in 4 weeks (October 07, 2023) for follow-up. -Check fasting labs including thyroid function tests prior to next visit. ATTESTASTION STATEMENTS:  Reviewed by clinician on day of visit: allergies, medications, problem list, medical history, surgical history, family history, social history, and previous encounter notes.   I have personally spent 30 minutes total time today in preparation, patient care, nutritional counseling and documentation for this visit, including the following: review of clinical lab tests; review of medical tests/procedures/services.      Tehani Mersman, PA-C

## 2023-09-09 ENCOUNTER — Telehealth (INDEPENDENT_AMBULATORY_CARE_PROVIDER_SITE_OTHER): Payer: Self-pay

## 2023-09-09 MED ORDER — ZEPBOUND 15 MG/0.5ML ~~LOC~~ SOAJ: 15.0000 mg | SUBCUTANEOUS | 0 refills | Status: DC

## 2023-09-09 NOTE — Telephone Encounter (Signed)
Case Id: [id number] Status: Approved Review Type:Prior Auth Coverage Start Date: 08/10/2023 Coverage End Date:09/08/2024  Call to patient to notify of approval and also sent a my chart message.

## 2023-09-09 NOTE — Telephone Encounter (Signed)
Prior Nichole Smith has been started for Zepbound.  Awaiting question response.

## 2023-09-09 NOTE — Addendum Note (Signed)
Addended by: Darrol Poke on: 09/09/2023 02:31 PM   Modules accepted: Orders

## 2023-09-09 NOTE — Telephone Encounter (Signed)
Awesome work

## 2023-09-20 ENCOUNTER — Encounter (HOSPITAL_BASED_OUTPATIENT_CLINIC_OR_DEPARTMENT_OTHER): Payer: Self-pay | Admitting: Obstetrics & Gynecology

## 2023-09-28 ENCOUNTER — Ambulatory Visit (INDEPENDENT_AMBULATORY_CARE_PROVIDER_SITE_OTHER): Payer: Medicare Other

## 2023-09-28 DIAGNOSIS — R002 Palpitations: Secondary | ICD-10-CM | POA: Diagnosis not present

## 2023-09-28 LAB — CUP PACEART REMOTE DEVICE CHECK
Date Time Interrogation Session: 20241027230605
Implantable Pulse Generator Implant Date: 20220620

## 2023-10-07 ENCOUNTER — Ambulatory Visit (INDEPENDENT_AMBULATORY_CARE_PROVIDER_SITE_OTHER): Payer: Medicare Other | Admitting: Physician Assistant

## 2023-10-07 ENCOUNTER — Encounter (INDEPENDENT_AMBULATORY_CARE_PROVIDER_SITE_OTHER): Payer: Self-pay | Admitting: Physician Assistant

## 2023-10-07 VITALS — BP 114/72 | HR 7 | Temp 98.1°F | Ht 59.0 in | Wt 187.0 lb

## 2023-10-07 DIAGNOSIS — E559 Vitamin D deficiency, unspecified: Secondary | ICD-10-CM

## 2023-10-07 DIAGNOSIS — E038 Other specified hypothyroidism: Secondary | ICD-10-CM

## 2023-10-07 DIAGNOSIS — D751 Secondary polycythemia: Secondary | ICD-10-CM

## 2023-10-07 DIAGNOSIS — R632 Polyphagia: Secondary | ICD-10-CM

## 2023-10-07 DIAGNOSIS — I2510 Atherosclerotic heart disease of native coronary artery without angina pectoris: Secondary | ICD-10-CM

## 2023-10-07 DIAGNOSIS — Z6837 Body mass index (BMI) 37.0-37.9, adult: Secondary | ICD-10-CM

## 2023-10-07 DIAGNOSIS — R7303 Prediabetes: Secondary | ICD-10-CM | POA: Diagnosis not present

## 2023-10-07 DIAGNOSIS — E7841 Elevated Lipoprotein(a): Secondary | ICD-10-CM

## 2023-10-07 DIAGNOSIS — E039 Hypothyroidism, unspecified: Secondary | ICD-10-CM

## 2023-10-07 NOTE — Progress Notes (Signed)
.smr  Office: [phone]  /  Fax: [phone]  WEIGHT SUMMARY AND BIOMETRICS  Vitals Temp: 98.1 F (36.7 C) BP: 114/72 Pulse Rate: (!) 7 SpO2: 96 %   Anthropometric Measurements Height: 4\' 11"  (1.499 m) Weight: 187 lb (84.8 kg) BMI (Calculated): 37.75 Weight at Last Visit: 189 lb Weight Lost Since Last Visit: 2 lb Weight Gained Since Last Visit: 0 lb Starting Weight: 238 lb Total Weight Loss (lbs): 51 lb (23.1 kg) Peak Weight: 238 lb   Body Composition  Body Fat %: 48.2 % Fat Mass (lbs): 90.4 lbs Muscle Mass (lbs): 92.2 lbs Total Body Water (lbs): 70 lbs Visceral Fat Rating : 16   Other Clinical Data Fasting: Yes Labs: Yes Today's Visit #: 55 Starting Date: 01/12/19     HPI  Chief Complaint: OBESITY  Donnis is here to discuss her progress with her obesity treatment plan. She is on the keeping a food journal and adhering to recommended goals of 1200-1300 calories and 90 grams of protein and states she is following her eating plan approximately 85 % of the time. She states she is exercising walk/water aerobics, strength training 20 minutes 7 times per week.   Interval History:  Since last office visit she is down 2 lbs.    The patient, with a history of obesity, prediabetes, and vitamin D deficiency, presents for a follow-up of her obesity treatment plan.  Weight loss of 51 pounds, attributing this success to her medication, Zepbound.  She describes her eating habits, noting that she has been mindful of her food choices and portions.  She also discusses strategies for managing her diet during the upcoming holiday season. The patient mentions being on Xarelto, a blood thinner, and Synthroid for hypothyroidism.  She is not currently taking Topiramate.  She also mentions an upcoming appointment with a cardiologist.  Fasting labs were obtained today.  She was informed we would discuss her lab results at her next visit unless there is a critical issue that  needs to be addressed sooner. She agreed to keep her next visit at the agreed upon time to discuss these results.   Pharmacotherapy: Zepbound 15 mg weekly. Denies mass in neck, dysphagia, dyspepsia, persistent hoarseness, abdominal pain, or N/V/Constipation or diarrhea. Has annual eye exam. Mood is stable.    Topamax- she held off on starting topamax but did fill the Rx and will plan to start when she tapers/stops Zepbound therapy.   TREATMENT PLAN FOR OBESITY:  Recommended Dietary Goals  Lethea is currently in the action stage of change. As such, her goal is to continue weight management plan. She has agreed to keeping a food journal and adhering to recommended goals of 1200-1300 calories and 90 grams of protein.  Behavioral Intervention  We discussed the following Behavioral Modification Strategies today: continue to work on maintaining a reduced calorie state, getting the recommended amount of protein, incorporating whole foods, making healthy choices, staying well hydrated and practicing mindfulness when eating..  Additional resources provided today: NA  Recommended Physical Activity Goals  Sharlyn has been advised to work up to 150 minutes of moderate intensity aerobic activity a week and strengthening exercises 2-3 times per week for cardiovascular health, weight loss maintenance and preservation of muscle mass.   She has agreed to Think about enjoyable ways to increase daily physical activity and overcoming barriers to exercise and Increase physical activity in their day and reduce sedentary time (increase NEAT).   Pharmacotherapy We discussed various medication options to help Marylu Lund  with her weight loss efforts and we both agreed to continue Zepbound 15 mg weekly.    Return in about 6 weeks (around 11/18/2023).[name] She was informed of the importance of frequent follow up visits to maximize her success with intensive lifestyle modifications for her multiple health  conditions.  PHYSICAL EXAM:  Blood pressure 114/72, pulse (!) 7, temperature 98.1 F (36.7 C), height 4\' 11"  (1.499 m), weight 187 lb (84.8 kg), last menstrual period 02/12/2011, SpO2 96%. Body mass index is 37.77 kg/m.  General: She is overweight, cooperative, alert, well developed, and in no acute distress. PSYCH: Has normal mood, affect and thought process.   Lungs: Normal breathing effort, no conversational dyspnea.  DIAGNOSTIC DATA REVIEWED:  BMET    Component Value Date/Time   NA 140 05/05/2023 1118   NA 141 02/18/2023 0913   K 3.6 05/05/2023 1118   CL 100 05/05/2023 1118   CO2 29 02/18/2023 0913   GLUCOSE 96 05/05/2023 1118   BUN 23 05/05/2023 1118   BUN 20 02/18/2023 0913   CREATININE 0.60 05/05/2023 1118   CALCIUM 9.6 02/18/2023 0913   GFRNONAA 91 01/22/2021 1047   GFRAA 105 01/22/2021 1047   Lab Results  Component Value Date   HGBA1C 5.6 02/18/2023   HGBA1C 5.9 06/07/2018   Lab Results  Component Value Date   INSULIN 13.8 02/18/2023   INSULIN 20.4 01/12/2019   Lab Results  Component Value Date   TSH 2.70 08/18/2022   CBC    Component Value Date/Time   WBC 9.4 05/05/2023 1115   RBC 5.07 05/05/2023 1115   HGB 16.0 (H) 05/05/2023 1118   HGB 15.9 06/27/2020 1123   HGB 15.4 03/13/2014 1422   HCT 47.0 (H) 05/05/2023 1118   HCT 47.9 (H) 06/27/2020 1123   HCT 45.3 03/13/2014 1422   PLT 250 05/05/2023 1115   PLT 243 06/27/2020 1123   MCV 92.9 05/05/2023 1115   MCV 94 06/27/2020 1123   MCV 89.0 03/13/2014 1422   MCH 33.1 05/05/2023 1115   MCHC 35.7 05/05/2023 1115   RDW 13.1 05/05/2023 1115   RDW 12.1 06/27/2020 1123   RDW 13.2 03/13/2014 1422   Iron Studies No results found for: "IRON", "TIBC", "FERRITIN", "IRONPCTSAT" Lipid Panel     Component Value Date/Time   CHOL 119 08/18/2022 0804   CHOL 148 10/03/2021 0806   CHOL 110 11/03/2016 0000   TRIG 46.0 08/18/2022 0804   TRIG 54 11/03/2016 0000   HDL 52.70 08/18/2022 0804   HDL 58  10/03/2021 0806   HDL 48 (L) 11/03/2016 0000   CHOLHDL 2 08/18/2022 0804   VLDL 9.2 08/18/2022 0804   LDLCALC 57 08/18/2022 0804   LDLCALC 76 10/03/2021 0806   LDLCALC 49 11/03/2016 0000   Hepatic Function Panel     Component Value Date/Time   PROT 6.9 02/18/2023 0913   ALBUMIN 4.1 02/18/2023 0913   AST 29 02/18/2023 0913   ALT 36 (H) 02/18/2023 0913   ALKPHOS 71 02/18/2023 0913   BILITOT 0.7 02/18/2023 0913   BILIDIR 0.1 08/18/2022 0804   IBILI 0.4 09/08/2016 0749      Component Value Date/Time   TSH 2.70 08/18/2022 0804   Nutritional Lab Results  Component Value Date   VD25OH 62.7 02/18/2023   VD25OH 52.5 09/16/2022   VD25OH 42.3 05/13/2022    ASSOCIATED CONDITIONS ADDRESSED TODAY  ASSESSMENT AND PLAN  Problem List Items Addressed This Visit     Hypothyroidism   Relevant Orders  TSH   Polycythemia, secondary   Relevant Orders   CBC with Differential/Platelet   Coronary artery calcification   Vitamin D deficiency   Relevant Orders   VITAMIN D 25 Hydroxy (Vit-D Deficiency, Fractures)   Prediabetes   Relevant Orders   CMP14+EGFR   Hemoglobin A1c   Insulin, random   Polyphagia - Primary   Obesity, Starting BMI 46.48   Other Visit Diagnoses     Elevated lipoprotein(a)       Relevant Orders   Lipid Panel With LDL/HDL Ratio     Obesity Significant weight loss of 51 pounds achieved through mindful eating and medication (Zepbound). Discussed strategies for maintaining progress during the holiday season and the importance of consistent medication use. -Continue current regimen of mindful eating and Zepbound. -Next follow-up appointment scheduled for 11/18/2023.  Prediabetes Last A1c was 5.3- at goal Insulin 13.9- improved and nearing goal.   Medication(s): Zepbound 15 mg SQ weekly Polyphagia:No Lab Results  Component Value Date   HGBA1C 5.3 10/07/2023   HGBA1C 5.6 02/18/2023   HGBA1C 5.7 08/18/2022   HGBA1C 5.5 05/13/2022   HGBA1C 5.5 10/03/2021    Lab Results  Component Value Date   INSULIN 13.9 10/07/2023   INSULIN 13.8 02/18/2023   INSULIN 22.2 09/16/2022   INSULIN 12.2 05/13/2022   INSULIN 13.6 10/03/2021    Plan: Continue Zepbound 15 mg SQ weekly Continue working on nutrition plan to decrease simple carbohydrates, increase lean proteins and exercise to promote weight loss, improve glycemic control and prevent progression to Type 2 diabetes.  Recheck fasting labs today.   Vitamin D Deficiency Vitamin D is at goal of 50.  Most recent vitamin D level was 74.0. She is on MVI with D. Lab Results  Component Value Date   VD25OH 74.0 10/07/2023   VD25OH 62.7 02/18/2023   VD25OH 52.5 09/16/2022    Plan: Continue MVI. Low vitamin D levels can be associated with adiposity and may result in leptin resistance and weight gain. Also associated with fatigue. Currently on vitamin D supplementation without any adverse effects.  Recheck vitamin D level today.    Hypothyroidism Stable.  Does not report symptoms associated with uncontrolled hypothyroidism. Medication(s): Levothyroxine 137 mcg daily. No side effects.  Lab Results  Component Value Date   TSH 1.570 10/07/2023    Plan: Continue levothyroxine at current dose. Recheck TSH today.    Secondary Polycythemia No new symptoms or issues discussed. -Order CBC lab test today.  Hyperlipoproteinemia/Hyperlipdemia:  Currently on Lipitor 40 mg daily without reported side effect. -Order lipid panel today.  General Health Maintenance -Order A1C and fasting insulin lab tests today. -Next lab tests scheduled for today, 10/07/2023.  ATTESTASTION STATEMENTS:  Reviewed by clinician on day of visit: allergies, medications, problem list, medical history, surgical history, family history, social history, and previous encounter notes.   I have personally spent 30 minutes total time today in preparation, patient care, nutritional counseling and documentation for this visit,  including the following: review of clinical lab tests; review of medical tests/procedures/services.      Kolbi Tofte, PA-C

## 2023-10-08 LAB — LIPID PANEL WITH LDL/HDL RATIO
Cholesterol, Total: 141 mg/dL (ref 100–199)
HDL: 61 mg/dL (ref >39)
LDL Chol Calc (NIH): 68 mg/dL (ref 0–99)
LDL/HDL Ratio: 1.1 ratio (ref 0.0–3.2)
Triglycerides: 55 mg/dL (ref 0–149)
VLDL Cholesterol Cal: 12 mg/dL (ref 5–40)

## 2023-10-08 LAB — CBC WITH DIFFERENTIAL/PLATELET
Basophils Absolute: 0 10*3/uL (ref 0.0–0.2)
Basos: 0 %
EOS (ABSOLUTE): 0.1 10*3/uL (ref 0.0–0.4)
Eos: 1 %
Hematocrit: 50.6 % — ABNORMAL HIGH (ref 34.0–46.6)
Hemoglobin: 16.7 g/dL — ABNORMAL HIGH (ref 11.1–15.9)
Immature Grans (Abs): 0 10*3/uL (ref 0.0–0.1)
Immature Granulocytes: 0 %
Lymphocytes Absolute: 2.1 10*3/uL (ref 0.7–3.1)
Lymphs: 29 %
MCH: 31.3 pg (ref 26.6–33.0)
MCHC: 33 g/dL (ref 31.5–35.7)
MCV: 95 fL (ref 79–97)
Monocytes Absolute: 0.7 10*3/uL (ref 0.1–0.9)
Monocytes: 9 %
Neutrophils Absolute: 4.6 10*3/uL (ref 1.4–7.0)
Neutrophils: 61 %
Platelets: 254 10*3/uL (ref 150–450)
RBC: 5.34 x10E6/uL — ABNORMAL HIGH (ref 3.77–5.28)
RDW: 11.6 % — ABNORMAL LOW (ref 11.7–15.4)
WBC: 7.5 10*3/uL (ref 3.4–10.8)

## 2023-10-08 LAB — CMP14+EGFR
ALT: 32 [IU]/L (ref 0–32)
AST: 25 [IU]/L (ref 0–40)
Albumin: 3.8 g/dL — ABNORMAL LOW (ref 3.9–4.9)
Alkaline Phosphatase: 75 [IU]/L (ref 44–121)
BUN/Creatinine Ratio: 30 — ABNORMAL HIGH (ref 12–28)
BUN: 18 mg/dL (ref 8–27)
Bilirubin Total: 0.5 mg/dL (ref 0.0–1.2)
CO2: 26 mmol/L (ref 20–29)
Calcium: 9.9 mg/dL (ref 8.7–10.3)
Chloride: 98 mmol/L (ref 96–106)
Creatinine, Ser: 0.6 mg/dL (ref 0.57–1.00)
Globulin, Total: 2.9 g/dL (ref 1.5–4.5)
Glucose: 77 mg/dL (ref 70–99)
Potassium: 3.8 mmol/L (ref 3.5–5.2)
Sodium: 141 mmol/L (ref 134–144)
Total Protein: 6.7 g/dL (ref 6.0–8.5)
eGFR: 97 mL/min/{1.73_m2} (ref >59)

## 2023-10-08 LAB — HEMOGLOBIN A1C
Est. average glucose Bld gHb Est-mCnc: 105 mg/dL
Hgb A1c MFr Bld: 5.3 % (ref 4.8–5.6)

## 2023-10-08 LAB — TSH: TSH: 1.57 u[IU]/mL (ref 0.450–4.500)

## 2023-10-08 LAB — INSULIN, RANDOM: INSULIN: 13.9 u[IU]/mL (ref 2.6–24.9)

## 2023-10-08 LAB — VITAMIN D 25 HYDROXY (VIT D DEFICIENCY, FRACTURES): Vit D, 25-Hydroxy: 74 ng/mL (ref 30.0–100.0)

## 2023-10-14 ENCOUNTER — Encounter: Payer: Self-pay | Admitting: Cardiology

## 2023-10-19 ENCOUNTER — Ambulatory Visit (INDEPENDENT_AMBULATORY_CARE_PROVIDER_SITE_OTHER): Payer: Medicare Other | Admitting: Obstetrics

## 2023-10-19 ENCOUNTER — Encounter: Payer: Self-pay | Admitting: Obstetrics

## 2023-10-19 VITALS — BP 139/80 | HR 78 | Ht 59.0 in | Wt 191.2 lb

## 2023-10-19 DIAGNOSIS — N8110 Cystocele, unspecified: Secondary | ICD-10-CM | POA: Insufficient documentation

## 2023-10-19 DIAGNOSIS — K5904 Chronic idiopathic constipation: Secondary | ICD-10-CM | POA: Diagnosis not present

## 2023-10-19 DIAGNOSIS — N3946 Mixed incontinence: Secondary | ICD-10-CM | POA: Diagnosis not present

## 2023-10-19 DIAGNOSIS — N390 Urinary tract infection, site not specified: Secondary | ICD-10-CM

## 2023-10-19 DIAGNOSIS — Z87440 Personal history of urinary (tract) infections: Secondary | ICD-10-CM | POA: Diagnosis not present

## 2023-10-19 LAB — POCT URINALYSIS DIPSTICK
Bilirubin, UA: NEGATIVE
Blood, UA: NEGATIVE
Glucose, UA: NEGATIVE
Ketones, UA: NEGATIVE
Leukocytes, UA: NEGATIVE
Nitrite, UA: NEGATIVE
Protein, UA: NEGATIVE
Spec Grav, UA: <=1.005 — AB (ref 1.010–1.025)
Urobilinogen, UA: 0.2 U/dL
pH, UA: 6.5 (ref 5.0–8.0)

## 2023-10-19 NOTE — Progress Notes (Signed)
[facility] Summary Report / Loop Recorder 

## 2023-10-19 NOTE — Assessment & Plan Note (Signed)
For constipation, we reviewed the importance of a better bowel regimen.  We also discussed the importance of avoiding chronic straining, as it can exacerbate her pelvic floor symptoms; we discussed treating constipation and straining prior to surgery, as postoperative straining can lead to damage to the repair and recurrence of symptoms. We discussed initiating therapy with increasing fluid intake, fiber supplementation, stool softeners, and laxatives such as miralax.  - encouraged to start probiotics and fiber supplementation

## 2023-10-19 NOTE — Assessment & Plan Note (Signed)
For treatment of recurrent urinary tract infections, we discussed management of recurrent UTIs including prophylaxis with a daily low dose antibiotic, transvaginal estrogen therapy, D-mannose, and cranberry supplements.  We discussed the role of diagnostic testing such as cystoscopy and upper tract imaging.   - continue vaginal estrogen 1g twice a week - start probiotics - discontinue macrobid daily

## 2023-10-19 NOTE — Assessment & Plan Note (Addendum)
-   POCT UA negative - We discussed the symptoms of overactive bladder (OAB), which include urinary urgency, urinary frequency, nocturia, with or without urge incontinence.  While we do not know the exact etiology of OAB, several treatment options exist. We discussed management including behavioral therapy (decreasing bladder irritants, urge suppression strategies, timed voids, bladder retraining), physical therapy, medication; for refractory cases posterior tibial nerve stimulation, sacral neuromodulation, and intravesical botulinum toxin injection.  For anticholinergic medications, we discussed the potential side effects of anticholinergics including dry eyes, dry mouth, constipation, cognitive impairment and urinary retention. For Beta-3 agonist medication, we discussed the potential side effect of elevated blood pressure which is more likely to occur in individuals with uncontrolled hypertension. - continue Gemtesa - discussed fluid management, bladder training, monitor caffeine intake, and resuming Kegel exercises - encouraged to continue weight loss - reviewed third line treatments, patient considering botox injections. Handout reviewed and provided  - For treatment of stress urinary incontinence,  non-surgical options include expectant management, weight loss, physical therapy, as well as a pessary.  Surgical options include a midurethral sling, Burch urethropexy, and transurethral injection of a bulking agent.

## 2023-10-19 NOTE — Assessment & Plan Note (Signed)
-   For treatment of pelvic organ prolapse, we discussed options for management including expectant management, conservative management, and surgical management, such as Kegels, a pessary, pelvic floor physical therapy, and specific surgical procedures. - resume Kegel exercises

## 2023-10-19 NOTE — Patient Instructions (Addendum)
We discussed the symptoms of overactive bladder (OAB), which include urinary urgency, urinary frequency, night-time urination, with or without urge incontinence.  We discussed management including behavioral therapy (decreasing bladder irritants by following a bladder diet, urge suppression strategies, timed voids, bladder retraining), physical therapy, medication; and for refractory cases posterior tibial nerve stimulation, sacral neuromodulation, and intravesical botulinum toxin injection.   Continue Gemtesa    For treatment of stress urinary incontinence, which is leakage with physical activity/movement/strainging/coughing, we discussed expectant management versus nonsurgical options versus surgery. Nonsurgical options include weight loss, physical therapy, as well as a pessary.  Surgical options include a midurethral sling, which is a synthetic mesh sling that acts like a hammock under the urethra to prevent leakage of urine, a Burch urethropexy, and transurethral injection of a bulking agent.   You have a stage 2 (out of 4) prolapse.  We discussed the fact that it is not life threatening but there are several treatment options. For treatment of pelvic organ prolapse, we discussed options for management including expectant management, conservative management, and surgical management, such as Kegels, a pessary, pelvic floor physical therapy, and specific surgical procedures.      Constipation: Our goal is to achieve formed bowel movements daily or every-other-day.  You may need to try different combinations of the following options to find what works best for you - everybody's body works differently so feel free to adjust the dosages as needed.  Some options to help maintain bowel health include:  Dietary changes (more leafy greens, vegetables and fruits; less processed foods) Fiber supplementation (Benefiber, FiberCon, Metamucil or Psyllium). Start slow and increase gradually to full  dose. Over-the-counter agents such as: stool softeners (Docusate or Colace) and/or laxatives (Miralax, milk of magnesia)  "Power Pudding" is a natural mixture that may help your constipation.  To make blend 1 cup applesauce, 1 cup wheat bran, and 3/4 cup prune juice, refrigerate and then take 1 tablespoon daily with a large glass of water as needed.   For treatment of recurrent urinary tract infections, we discussed management of recurrent UTIs including prophylaxis with a daily low dose antibiotic, transvaginal estrogen therapy, D-mannose, and cranberry supplements.  We discussed the role of diagnostic testing such as cystoscopy and upper tract imaging.     Continue vaginal estrogen 1g twice a week  Discontinue macrobid.

## 2023-10-19 NOTE — Progress Notes (Signed)
New Patient Evaluation and Consultation  Referring Provider: Jerene Bears, MD PCP: Madelin Headings, MD Date of Service: 10/19/2023  SUBJECTIVE Chief Complaint: New Patient (Initial Visit) Nichole Smith is a 67 y.o. female here today for over active bladder.)  History of Present Illness: Nichole Smith is a 67 y.o. White or Caucasian female seen in consultation at the request of Dr Hyacinth Meeker for evaluation of overactive bladder.    Tried pelvic floor exercises, PT (minimal relief), oxybutynin (no relief), mirabegron (no relief), vaginal estrogen tablet, ring Gemtesa with relief, evaluated by Dr. Mertie Moores  History of postmenopausal bleeding H/o rUTI on vaginal estrogen, macrobid 100mg  daily for UTI suppression for a few years Reports UTI symptoms as: bladder spasms,  Denies dysuria, hematuria, increased urgency/frequency.  Review of records significant for: History of carcinoid lung cancer, A. Fib on Xarelto, h/o pre-DM with HbA1C 5.3 on 10/07/23, lichen planus, h/o HSV Gabapentin for spinal stenosis   Urinary Symptoms: Leaks urine with cough/ sneeze, exercise, with a full bladder, and with movement to the bathroom Reports urinary frequency/urgency since childhood, leakage started after childbirth.  Leaks 0-1 time(s) per days when she is coming home from the pool Leakage with sneezing 2x/week with larger volume leakage onto pants Pad use: 2 pads per day.   Patient is bothered by UI symptoms.  Day time voids 10-15 mostly to anticipate and attempt to reduce leakage.  Nocturia: 1 times per night to void with OSA with CPAP use and insomnia Lost 52lbs in 5 years Stops drinking fluid at 6pm Denies leg swelling Voiding dysfunction:  empties bladder well.  Patient does not use a catheter to empty bladder.  When urinating, patient feels a weak stream Drinks: >64oz water per day, 12oz of coffee, 2-3 decaf sodas/week  UTIs: 1 UTI's in the last year.   Denies history of blood in  urine, kidney or bladder stones, pyelonephritis, bladder cancer, and kidney cancer No results found for the last 90 days.   Pelvic Organ Prolapse Symptoms:                  Patient Denies a feeling of a bulge the vaginal area.  Bowel Symptom: Bowel movements: 1 time(s) per day with constipation Stool consistency: soft  Straining: no.  Splinting: no.  Incomplete evacuation: yes.  Patient Denies accidental bowel leakage / fecal incontinence Bowel regimen: diet and miralax Last colonoscopy:, Results - Two sessile polyps removed, hemorrhoids noted. Due for follow-up pending appt tomorrow [facility] Colonoscopy          Overdue - Colonoscopy (Every 3 Years) Overdue since 09/26/2023    09/25/2020  COLONOSCOPY   Only the first 1 history entries have been loaded, but more history exists.            Sexual Function Sexually active: no.  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  Download 08/2014:  Good compliance, breakthru apnea on 10cm.  Changed to auto 5-12cm >> good control of AHI on f/u download ONO on CPAP:       Osteoarthritis    PCOS (polycystic ovarian syndrome)    PMB (postmenopausal bleeding)    SUI (stress urinary incontinence, female)    h/o   Wears glasses      Past Surgical History:   Past Surgical History:  Procedure Laterality Date   ANTERIOR CERVICAL DECOMP/DISCECTOMY FUSION   12/24/2017   Procedure: Cervical five-six Cervical six-seven Anterior cervical decompression/discectomy/fusion;  Surgeon: Maeola Harman, MD;  Location: [facility] OR;  Service: Neurosurgery;;   CARPAL TUNNEL RELEASE Left 12/24/2017   Procedure: LEFT CARPAL TUNNEL RELEASE;  Surgeon: Maeola Harman, MD;  Location: [facility] OR;  Service: Neurosurgery;  Laterality: Left;   CATARACT EXTRACTION Bilateral    Right eye 08/24/2023   CESAREAN SECTION     x 1 - twins   COLONOSCOPY  2021   DILATATION & CURRETTAGE/HYSTEROSCOPY WITH RESECTOCOPE N/A 07/18/2013   Procedure: DILATATION & CURETTAGE/HYSTEROSCOPY WITH RESECTOCOPE;  Surgeon: Annamaria Boots, MD;  Location: [facility] ORS;  Service: Gynecology;  Laterality: N/A;  mass resection   HYSTEROSCOPY WITH D & C N/A 05/05/2023   Procedure: DILATATION AND CURETTAGE /HYSTEROSCOPY;  Surgeon: Jerene Bears, MD;  Location: [facility];  Service: Gynecology;  Laterality: N/A;   implantable loop recorder placement  05/20/2021   Medtronic Reveal Linq model [id number] implantable loop recorder (SN [id number])   Lung tumor removed  2012   Rt lung carcinoid   MOUTH SURGERY     pre-cancerous ulcer removed   POLYPECTOMY     SHOULDER OPEN ROTATOR CUFF REPAIR  07/20/2010   Right   WISDOM TOOTH EXTRACTION       Past OB/GYN History: OB History  Gravida Para Term Preterm AB Living  2 2 1     3   SAB IAB Ectopic Multiple Live Births          2    # Outcome Date GA Lbr Len/2nd Weight Sex Type Anes PTL Lv  2 Para     F CS-Classical   LIV  1 Term     F Vag-Spont   LIV    Vaginal deliveries: 1,  Forceps/ Vacuum deliveries: 0, Cesarean section: 1 for twins Menopausal: Yes, at age 28, Admits to vaginal bleeding since menopause s/p hysteroscopy D&C 07/18/13 and 05/05/23 by Dr. Hyacinth Meeker. EMB negative for hyperplasia or malignancy. Denies PMB since D&C Contraception: none. Last pap smear.  Any history of abnormal pap smears: no.    Component Value Date/Time   DIAGPAP  12/26/2022  1145    - Negative for Intraepithelial Lesions or Malignancy (NILM)   DIAGPAP - Benign reactive/reparative changes 12/26/2022 1145   DIAGPAP  07/26/2020 1111    - Negative for intraepithelial lesion or malignancy (NILM)   ADEQPAP  12/26/2022 1145    Satisfactory for evaluation. The presence or absence of an   ADEQPAP  12/26/2022 1145    endocervical/transformation zone component cannot be determined because   ADEQPAP of atrophy. 12/26/2022 1145    Medications: Patient has a current medication list which includes the following prescription(s): atorvastatin, calcium, chlorthalidone, denosumab, diltiazem, diltiazem, doxepin hcl, estradiol, gabapentin, gemtesa, hydrocodone bit-homatropine, metoprolol succinate, multiple vitamins-minerals, rivaroxaban, synthroid, and zepbound.   Allergies: Patient is allergic to neomycin and ace inhibitors.   Social History:  Social History   Tobacco  Use   Smoking status: Never   Smokeless tobacco: Never   Tobacco comments:    Never smoked 08/07/23  Vaping Use   Vaping status: Never Used  Substance Use Topics   Alcohol use: Yes    Comment: occ   Drug use: No    Relationship status: married Patient lives with her husband.   Patient is not employed. Regular exercise: Yes: walk, strength, water aerobics History of abuse: No  Family History:   Family History  Problem Relation Age of Onset   Stroke Mother        died age 16   Hypertension Mother    Sudden death Mother    Alcoholism Mother    Coronary artery disease Father        died age 80   Hypertension Father    Liver disease Father    Alcohol abuse Father    Obesity Father    Parkinson's disease Brother    Diabetes Brother    Hypertension Brother    Hypertension Maternal Uncle    Colon cancer Neg Hx    Rectal cancer Neg Hx    Stomach cancer Neg Hx    Colon polyps Neg Hx    Esophageal cancer Neg Hx      Review of Systems: Review of Systems  Constitutional:  Positive for  weight loss. Negative for fever and malaise/fatigue.  Respiratory:  Negative for cough, shortness of breath and wheezing.   Cardiovascular:  Negative for chest pain, palpitations and leg swelling.  Gastrointestinal:  Negative for abdominal pain and blood in stool.  Genitourinary:  Positive for frequency and urgency. Negative for dysuria and hematuria.       Leakage  Skin:  Negative for rash.  Neurological:  Negative for dizziness, weakness and headaches.  Endo/Heme/Allergies:  Does not bruise/bleed easily.  Psychiatric/Behavioral:  Negative for depression. The patient is not nervous/anxious.    OBJECTIVE Physical Exam: Vitals:   10/19/23 0932  BP: 139/80  Pulse: 78  Weight: 191 lb 3.2 oz (86.7 kg)  Height: 4\' 11"  (1.499 m)    Physical Exam Constitutional:      General: She is not in acute distress.    Appearance: Normal appearance.  Genitourinary:     Bladder and urethral meatus normal.     No lesions in the vagina.     Right Labia: No rash, tenderness, lesions, skin changes or Bartholin's cyst.    Left Labia: No tenderness, lesions, skin changes, Bartholin's cyst or rash.    No vaginal discharge, erythema, tenderness, bleeding, ulceration or granulation tissue.     Anterior, posterior and apical vaginal prolapse present.    Mild vaginal atrophy present.     Right Adnexa: not tender, not full and no mass present.    Left Adnexa: not tender, not full and no mass present.    No cervical motion tenderness, discharge, friability, lesion, polyp or nabothian cyst.     Uterus is prolapsed.     Uterus is not enlarged, fixed, tender or irregular.     No uterine mass detected.    Urethral meatus caruncle not present.    No urethral prolapse, tenderness, mass, hypermobility, discharge or stress urinary incontinence with cough stress test present.     Bladder is not tender, urgency on palpation not present and masses not present.      Pelvic Floor: Levator muscle strength is 4/5.     Levator ani not tender, obturator internus not tender, no asymmetrical contractions present  and no pelvic spasms present.    Symmetrical pelvic sensation, anal wink present and BC reflex present. Cardiovascular:     Rate and Rhythm: Normal rate.  Pulmonary:     Effort: Pulmonary effort is normal. No respiratory distress.  Abdominal:     General: There is no distension.     Palpations: Abdomen is soft. There is no mass.     Tenderness: There is no abdominal tenderness.     Hernia: No hernia is present.  Neurological:     Mental Status: She is alert.  Vitals reviewed. Exam conducted with a chaperone present.     POP-Q:   POP-Q  0                                            Aa   0                                           Ba  -5                                              C   4                                            Gh  4                                            Pb  9                                            tvl   -1                                            Ap  -1                                            Bp  -6                                              D    Post-Void Residual (PVR) by Bladder Scan: In order to evaluate bladder emptying, we discussed obtaining a postvoid residual and patient agreed to this procedure.  Procedure: The ultrasound unit was placed on the patient's abdomen in the suprapubic region after the patient had voided.    Post Void Residual - 10/19/23 0942       Post Void Residual   Post  Void Residual 109 mL            Straight Catheterization Procedure for PVR: After verbal consent was obtained from the patient for catheterization to assess bladder emptying and residual volume the urethra and surrounding tissues were prepped with betadine and an in and out catheterization was performed.  PVR was 25mL.  Urine appeared clear yellow. The patient tolerated the procedure well.   Laboratory Results: Lab Results  Component Value  Date   COLORU yellow 10/19/2023   CLARITYU clear 10/19/2023   GLUCOSEUR Negative 10/19/2023   BILIRUBINUR Negative 10/19/2023   KETONESU Negative 10/19/2023   SPECGRAV <=1.005 (A) 10/19/2023   RBCUR Negative 10/19/2023   PHUR 6.5 10/19/2023   PROTEINUR Negative 10/19/2023   UROBILINOGEN 0.2 10/19/2023   LEUKOCYTESUR Negative 10/19/2023    Lab Results  Component Value Date   CREATININE 0.60 10/07/2023   CREATININE 0.60 05/05/2023   CREATININE 0.62 02/18/2023    Lab Results  Component Value Date   HGBA1C 5.3 10/07/2023    Lab Results  Component Value Date   HGB 16.7 (H) 10/07/2023     ASSESSMENT AND PLAN Ms. Macfarlane is a 67 y.o. with:  1. Recurrent UTI   2. Urinary incontinence, mixed   3. Pelvic organ prolapse quantification stage 2 cystocele   4. Chronic idiopathic constipation     Recurrent UTI Assessment & Plan: For treatment of recurrent urinary tract infections, we discussed management of recurrent UTIs including prophylaxis with a daily low dose antibiotic, transvaginal estrogen therapy, D-mannose, and cranberry supplements.  We discussed the role of diagnostic testing such as cystoscopy and upper tract imaging.   - continue vaginal estrogen 1g twice a week - start probiotics - discontinue macrobid daily  Orders: -     POCT urinalysis dipstick  Urinary incontinence, mixed Assessment & Plan: - POCT UA negative - We discussed the symptoms of overactive bladder (OAB), which include urinary urgency, urinary frequency, nocturia, with or without urge incontinence.  While we do not know the exact etiology of OAB, several treatment options exist. We discussed management including behavioral therapy (decreasing bladder irritants, urge suppression strategies, timed voids, bladder retraining), physical therapy, medication; for refractory cases posterior tibial nerve stimulation, sacral neuromodulation, and intravesical botulinum toxin injection.  For anticholinergic  medications, we discussed the potential side effects of anticholinergics including dry eyes, dry mouth, constipation, cognitive impairment and urinary retention. For Beta-3 agonist medication, we discussed the potential side effect of elevated blood pressure which is more likely to occur in individuals with uncontrolled hypertension. - continue Gemtesa - discussed fluid management, bladder training, resuming Kegel exercises - encouraged to continue weight loss - reviewed third line treatments, patient considering botox injections. Handout reviewed and provided  - For treatment of stress urinary incontinence,  non-surgical options include expectant management, weight loss, physical therapy, as well as a pessary.  Surgical options include a midurethral sling, Burch urethropexy, and transurethral injection of a bulking agent.  Orders: -     POCT urinalysis dipstick  Pelvic organ prolapse quantification stage 2 cystocele Assessment & Plan: - For treatment of pelvic organ prolapse, we discussed options for management including expectant management, conservative management, and surgical management, such as Kegels, a pessary, pelvic floor physical therapy, and specific surgical procedures. - resume Kegel exercises   Chronic idiopathic constipation Assessment & Plan: For constipation, we reviewed the importance of a better bowel regimen.  We also discussed the importance of avoiding chronic straining, as it can exacerbate her pelvic  floor symptoms; we discussed treating constipation and straining prior to surgery, as postoperative straining can lead to damage to the repair and recurrence of symptoms. We discussed initiating therapy with increasing fluid intake, fiber supplementation, stool softeners, and laxatives such as miralax.  - encouraged to start probiotics and fiber supplementation   Time spent: I spent 86 minutes dedicated to the care of this patient on the date of this encounter to include  pre-visit review of records, face-to-face time with the patient discussing mixed urinary incontinence, pelvic organ prolapse, constipation, rUTI, and post visit documentation and ordering medication/ testing.   Loleta Chance, MD

## 2023-10-19 NOTE — Progress Notes (Unsigned)
No chief complaint on file.   HPI: Patient  Nichole Smith  67 y.o. comes in today for yearly visit   Health Maintenance  Topic Date Due   COVID-19 Vaccine (6 - 2023-24 season) 08/02/2023   Colonoscopy  09/26/2023   Medicare Annual Wellness (AWV)  11/13/2023   DTaP/Tdap/Td (4 - Td or Tdap) 01/31/2024   MAMMOGRAM  08/26/2024   Pneumonia Vaccine 64+ Years old  Completed   INFLUENZA VACCINE  Completed   DEXA SCAN  Completed   Hepatitis C Screening  Completed   Zoster Vaccines- Shingrix  Completed   HPV VACCINES  Aged Out   Health Maintenance Review LIFESTYLE:  Exercise:   Tobacco/ETS: Alcohol:  Sugar beverages: Sleep: Drug use: no HH of  Work:    ROS:  GEN/ HEENT: No fever, significant weight changes sweats headaches vision problems hearing changes, CV/ PULM; No chest pain shortness of breath cough, syncope,edema  change in exercise tolerance. GI /GU: No adominal pain, vomiting, change in bowel habits. No blood in the stool. No significant GU symptoms. SKIN/HEME: ,no acute skin rashes suspicious lesions or bleeding. No lymphadenopathy, nodules, masses.  NEURO/ PSYCH:  No neurologic signs such as weakness numbness. No depression anxiety. IMM/ Allergy: No unusual infections.  Allergy .   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  Download 08/2014:  Good compliance, breakthru apnea on 10cm.  Changed to auto 5-12cm >> good control of AHI on  f/u download ONO on CPAP:       Osteoarthritis    PCOS (polycystic ovarian syndrome)    PMB (postmenopausal bleeding)    SUI (stress urinary incontinence, female)    h/o   Wears glasses     Past Surgical History:  Procedure Laterality Date   ANTERIOR CERVICAL DECOMP/DISCECTOMY FUSION  12/24/2017   Procedure: Cervical five-six Cervical six-seven Anterior cervical decompression/discectomy/fusion;  Surgeon: Maeola Harman, MD;  Location: [facility] OR;  Service: Neurosurgery;;   CARPAL TUNNEL RELEASE Left 12/24/2017   Procedure: LEFT CARPAL TUNNEL RELEASE;  Surgeon: Maeola Harman, MD;  Location: [facility] OR;  Service: Neurosurgery;  Laterality: Left;   CATARACT EXTRACTION Bilateral    Right eye 08/24/2023   CESAREAN SECTION     x 1 - twins   COLONOSCOPY  2021   DILATATION & CURRETTAGE/HYSTEROSCOPY WITH RESECTOCOPE N/A 07/18/2013   Procedure: DILATATION & CURETTAGE/HYSTEROSCOPY WITH RESECTOCOPE;  Surgeon: Annamaria Boots, MD;  Location: [facility];  Service: Gynecology;  Laterality: N/A;  mass resection   HYSTEROSCOPY WITH D & C N/A 05/05/2023   Procedure: DILATATION AND CURETTAGE /HYSTEROSCOPY;  Surgeon: Jerene Bears, MD;  Location: [facility];  Service: Gynecology;  Laterality: N/A;   implantable loop recorder placement  05/20/2021   Medtronic Reveal Linq model [id number] implantable loop recorder (SN [id number])   Lung tumor removed  2012   Rt  lung carcinoid   MOUTH SURGERY     pre-cancerous ulcer removed   POLYPECTOMY     SHOULDER OPEN ROTATOR CUFF REPAIR  07/20/2010   Right   WISDOM TOOTH EXTRACTION      Family History  Problem Relation Age of Onset   Stroke Mother        died age 72   Hypertension Mother    Sudden death Mother    Alcoholism Mother    Coronary artery disease Father        died age 65   Hypertension Father    Liver disease Father    Alcohol abuse Father    Obesity Father    Parkinson's disease Brother    Diabetes Brother    Hypertension Brother     Hypertension Maternal Uncle    Colon cancer Neg Hx    Rectal cancer Neg Hx    Stomach cancer Neg Hx    Colon polyps Neg Hx    Esophageal cancer Neg Hx     Social History   Socioeconomic History   Marital status: Married    Spouse name: Amada Jupiter   Number of children: 2   Years of education: Not on file   Highest education level: Bachelor's degree (e.g., BA, AB, BS)  Occupational History   Occupation: retired    Associate Professor: Museum/gallery exhibitions officer  Tobacco Use   Smoking status: Never   Smokeless tobacco: Never   Tobacco comments:    Never smoked 08/07/23  Vaping Use   Vaping status: Never Used  Substance and Sexual Activity   Alcohol use: Yes    Comment: occ   Drug use: No   Sexual activity: Not Currently    Partners: Male    Birth control/protection: Post-menopausal  Other Topics Concern   Not on file  Social History Narrative   hhof 2    2 Children at college and  beyond      Hall Busing    Married    Retired age 46 2015   Recently moved taking care of grandchildren during the week.   Social Determinants of Health   Financial Resource Strain: Low Risk  (10/17/2023)   Overall Financial Resource Strain (CARDIA)    Difficulty of Paying Living Expenses: Not hard at all  Food Insecurity: No Food Insecurity (10/17/2023)   Hunger Vital Sign    Worried About Running Out of Food in the Last Year: Never true    Ran Out of Food in the Last Year: Never true  Transportation Needs: No Transportation Needs (10/17/2023)   PRAPARE - Administrator, Civil Service (Medical): No    Lack of Transportation (Non-Medical): No  Physical Activity: Insufficiently Active (10/17/2023)   Exercise Vital Sign    Days of Exercise per Week: 6 days    Minutes of Exercise per Session: 20 min  Stress: No Stress Concern Present (10/17/2023)   [employer] of Occupational Health - Occupational Stress Questionnaire    Feeling of Stress : Only a little  Social Connections:  Socially Integrated (10/17/2023)   Social Connection and Isolation Panel [NHANES]    Frequency of Communication with Friends and Family: More than three times a week    Frequency of Social Gatherings with Friends and Family: Twice a week    Attends Religious Services: More than 4 times per year    Active Member of [employer] or Organizations: Yes    Attends Banker Meetings: More than 4 times per  year    Marital Status: Married    Outpatient Medications Prior to Visit  Medication Sig Dispense Refill   atorvastatin (LIPITOR) 40 MG tablet Take 1 tablet (40 mg total) by mouth daily. 90 tablet 3   CALCIUM PO Take 1 tablet by mouth at bedtime.      chlorthalidone (HYGROTON) 25 MG tablet Take 1 tablet (25 mg total) by mouth daily. 90 tablet 3   denosumab (PROLIA) 60 MG/ML SOSY injection Inject 60 mg into the skin every 6 (six) months.     diltiazem (CARDIZEM CD) 180 MG 24 hr capsule Take 1 capsule (180 mg total) by mouth daily. 90 capsule 3   diltiazem (CARDIZEM) 30 MG tablet 1 tablet every 6 hours for palpitations 60 tablet 3   Doxepin HCl 3 MG TABS TAKE 1 TABLET (3 MG TOTAL) BY MOUTH AT BEDTIME AS NEEDED. 30 tablet 2   estradiol (ESTRACE) 0.1 MG/GM vaginal cream INSERT 1 GM VAGINALLY TWICE WEEKLY 42.5 g 3   gabapentin (NEURONTIN) 300 MG capsule Take 300 mg by mouth daily.     GEMTESA 75 MG TABS Take 1 tablet by mouth daily as needed.     HYDROcodone bit-homatropine (HYCODAN) 5-1.5 MG/5ML syrup Take 5 mLs by mouth every 8 (eight) hours as needed for cough. 120 mL 0   metoprolol succinate (TOPROL-XL) 50 MG 24 hr tablet Take 1 tablet (50 mg total) by mouth daily. 90 tablet 3   Multiple Vitamins-Minerals (PRESERVISION AREDS 2 PO) Take 1 tablet by mouth 2 (two) times daily.     rivaroxaban (XARELTO) 20 MG TABS tablet Take 1 tablet (20 mg total) by mouth daily with supper. 90 tablet 2   SYNTHROID 137 MCG tablet TAKE 1 TABLET DAILY (NEED TO SCHEDULE APPOINTMENT FOR MORE REFILLS. NEED TO KEEP  UPCOMING APPOINTMENT) 90 tablet 3   tirzepatide (ZEPBOUND) 15 MG/0.5ML Pen Inject 15 mg into the skin once a week. 6 mL 0   No facility-administered medications prior to visit.     EXAM:  LMP 02/12/2011   There is no height or weight on file to calculate BMI. Wt Readings from Last 3 Encounters:  10/19/23 191 lb 3.2 oz (86.7 kg)  10/07/23 187 lb (84.8 kg)  09/08/23 189 lb (85.7 kg)    Physical Exam: Vital signs reviewed WNU:UVOZ is a well-developed well-nourished alert cooperative    who appearsr stated age in no acute distress.  HEENT: normocephalic atraumatic , Eyes: PERRL EOM's full, conjunctiva clear, Nares: paten,t no deformity discharge or tenderness., Ears: no deformity EAC's clear TMs with normal landmarks. Mouth: clear OP, no lesions, edema.  Moist mucous membranes. Dentition in adequate repair. NECK: supple without masses, thyromegaly or bruits. CHEST/PULM:  Clear to auscultation and percussion breath sounds equal no wheeze , rales or rhonchi. No chest wall deformities or tenderness. Breast: normal by inspection . No dimpling, discharge, masses, tenderness or discharge . CV: PMI is nondisplaced, S1 S2 no gallops, murmurs, rubs. Peripheral pulses are full without delay.No JVD .  ABDOMEN: Bowel sounds normal nontender  No guard or rebound, no hepato splenomegal no CVA tenderness.  Extremtities:  No clubbing cyanosis or edema, no acute joint swelling or redness no focal atrophy NEURO:  Oriented x3, cranial nerves 3-12 appear to be intact, no obvious focal weakness,gait within normal limits no abnormal reflexes or asymmetrical SKIN: No acute rashes normal turgor, color, no bruising or petechiae. PSYCH: Oriented, good eye contact, no obvious depression anxiety, cognition and judgment appear normal. LN: no cervical  axillary adenopathy  Lab Results  Component Value Date   WBC 7.5 10/07/2023   HGB 16.7 (H) 10/07/2023   HCT 50.6 (H) 10/07/2023   PLT 254 10/07/2023   GLUCOSE 77  10/07/2023   CHOL 141 10/07/2023   TRIG 55 10/07/2023   HDL 61 10/07/2023   LDLCALC 68 10/07/2023   ALT 32 10/07/2023   AST 25 10/07/2023   NA 141 10/07/2023   K 3.8 10/07/2023   CL 98 10/07/2023   CREATININE 0.60 10/07/2023   BUN 18 10/07/2023   CO2 26 10/07/2023   TSH 1.570 10/07/2023   INR 0.96 06/11/2011   HGBA1C 5.3 10/07/2023    BP Readings from Last 3 Encounters:  10/19/23 139/80  10/07/23 114/72  09/08/23 99/66    Lab results reviewed with patient   ASSESSMENT AND PLAN:  Discussed the following assessment and plan:  No diagnosis found. No follow-ups on file.  Patient Care Team: Ramil Edgington, Neta Mends, MD as PCP - General (Internal Medicine) Quintella Reichert, MD as PCP - Cardiology (Cardiology) Regan Lemming, MD as PCP - Electrophysiology (Cardiology) Elmon Else, MD (Dermatology) Jerene Bears, MD as Attending Physician (Gynecology) Frederico Hamman, MD as Attending Physician (Orthopedic Surgery) There are no Patient Instructions on file for this visit.  Neta Mends. Wang Granada M.D.

## 2023-10-20 ENCOUNTER — Ambulatory Visit: Payer: Medicare Other

## 2023-10-20 ENCOUNTER — Ambulatory Visit (INDEPENDENT_AMBULATORY_CARE_PROVIDER_SITE_OTHER): Payer: Medicare Other | Admitting: Gastroenterology

## 2023-10-20 ENCOUNTER — Telehealth: Payer: Self-pay | Admitting: *Deleted

## 2023-10-20 ENCOUNTER — Encounter: Payer: Self-pay | Admitting: Gastroenterology

## 2023-10-20 ENCOUNTER — Encounter: Payer: Self-pay | Admitting: Internal Medicine

## 2023-10-20 ENCOUNTER — Ambulatory Visit (INDEPENDENT_AMBULATORY_CARE_PROVIDER_SITE_OTHER): Payer: Medicare Other | Admitting: Internal Medicine

## 2023-10-20 VITALS — BP 110/76 | HR 77 | Temp 98.1°F | Ht 59.0 in | Wt 191.4 lb

## 2023-10-20 VITALS — BP 118/68 | HR 75 | Ht 59.0 in | Wt 192.1 lb

## 2023-10-20 DIAGNOSIS — D692 Other nonthrombocytopenic purpura: Secondary | ICD-10-CM

## 2023-10-20 DIAGNOSIS — Z860100 Personal history of colon polyps, unspecified: Secondary | ICD-10-CM | POA: Diagnosis not present

## 2023-10-20 DIAGNOSIS — D751 Secondary polycythemia: Secondary | ICD-10-CM | POA: Diagnosis not present

## 2023-10-20 DIAGNOSIS — E063 Autoimmune thyroiditis: Secondary | ICD-10-CM | POA: Diagnosis not present

## 2023-10-20 DIAGNOSIS — I10 Essential (primary) hypertension: Secondary | ICD-10-CM | POA: Diagnosis not present

## 2023-10-20 DIAGNOSIS — Z7901 Long term (current) use of anticoagulants: Secondary | ICD-10-CM | POA: Insufficient documentation

## 2023-10-20 DIAGNOSIS — Z79899 Other long term (current) drug therapy: Secondary | ICD-10-CM

## 2023-10-20 DIAGNOSIS — D3A090 Benign carcinoid tumor of the bronchus and lung: Secondary | ICD-10-CM | POA: Diagnosis not present

## 2023-10-20 DIAGNOSIS — R058 Other specified cough: Secondary | ICD-10-CM

## 2023-10-20 DIAGNOSIS — E038 Other specified hypothyroidism: Secondary | ICD-10-CM

## 2023-10-20 MED ORDER — HYDROCODONE BIT-HOMATROP MBR 5-1.5 MG/5ML PO SOLN: 5.0000 mL | Freq: Three times a day (TID) | ORAL | 0 refills | Status: DC | PRN

## 2023-10-20 MED ORDER — SYNTHROID 125 MCG PO TABS: 125.0000 ug | ORAL_TABLET | Freq: Every day | ORAL | 3 refills | Status: DC

## 2023-10-20 MED ORDER — PLENVU 140 G PO SOLR: 1.0000 | Freq: Once | ORAL | 0 refills | Status: AC

## 2023-10-20 NOTE — Patient Instructions (Signed)
Good to see  you today . Not concerned about   purpura on the arms at this time. Change dose of synthroid Check tsh 2-3 months after change .  C xray .

## 2023-10-20 NOTE — Telephone Encounter (Signed)
Patient with diagnosis of afib on Xarelto for anticoagulation.    Procedure: colonoscopy Date of procedure: 11/12/23   CHA2DS2-VASc Score = 4   This indicates a 4.8% annual risk of stroke. The patient's score is based upon: CHF History: 0 HTN History: 1 Diabetes History: 0 Stroke History: 0 Vascular Disease History: 1 (CAD on CT) Age Score: 1 Gender Score: 1      CrCl 85 ml/min  Per office protocol, patient can hold Xarelto for 2 days prior to procedure.    **This guidance is not considered finalized until pre-operative APP has relayed final recommendations.**

## 2023-10-20 NOTE — Patient Instructions (Signed)
You will be contacted by our office prior to your procedure for directions on holding your Xarelto.  If you do not hear from our office 1 week prior to your scheduled procedure, please call [phone] to discuss.    You have been scheduled for a colonoscopy. Please follow written instructions given to you at your visit today.   Please pick up your prep supplies at the pharmacy within the next 1-3 days.  If you use inhalers (even only as needed), please bring them with you on the day of your procedure.  DO NOT TAKE 7 DAYS PRIOR TO TEST- Trulicity (dulaglutide) Ozempic, Wegovy (semaglutide) Mounjaro (tirzepatide) Bydureon Bcise (exanatide extended release)  DO NOT TAKE 1 DAY PRIOR TO YOUR TEST Rybelsus (semaglutide) Adlyxin (lixisenatide) Victoza (liraglutide) Byetta (exanatide)  _______________________________________________________  If your blood pressure at your visit was 140/90 or greater, please contact your primary care physician to follow up on this.  _______________________________________________________  If you are age 69 or older, your body mass index should be between 23-30. Your Body mass index is 38.8 kg/m. If this is out of the aforementioned range listed, please consider follow up with your Primary Care Provider.  If you are age 61 or younger, your body mass index should be between 19-25. Your Body mass index is 38.8 kg/m. If this is out of the aformentioned range listed, please consider follow up with your Primary Care Provider.   ________________________________________________________  The [facility] providers would like to encourage you to use [facility] to communicate with providers for non-urgent requests or questions.  Due to long hold times on the telephone, sending your provider a message by [facility] may be a faster and more efficient way to get a response.  Please allow 48 business hours for a response.  Please remember that this is for non-urgent requests.   _______________________________________________________

## 2023-10-20 NOTE — Progress Notes (Unsigned)
[name] [address] [place] [address] Phone: [phone] Subjective:   Nichole Smith, am serving as a scribe for Dr. Antoine Primas.  I'm seeing this patient by the request  of:  Panosh, Neta Mends, MD  CC: Back and left hip pain, left knee pain  JJO:[id number]  08/19/2023 Lumbar radiculopathy secondary to the spinal stenosis.  Has responded fairly well to the epidurals.  We discussed with patient about icing regimen and home exercises, which activities to do and which ones to avoid.  Increase activity slowly.  Follow-up again in 6 to 8 weeks otherwise. Discussed naltraxone      Chronic problem with exacerbation.  Likely compensating for more of the spinal stenosis.  Discussed which activities to do and which ones to avoid.  Increase activity slowly.  Follow-up again in 6 to 8 weeks      Update 10/21/2023 Nichole Smith is a 67 y.o. female coming in with complaint of back and L hip pain. Patient states nothing new that needs to be discussed. Back is doing much better. Wants to delay injection. L leg pain hamstring to knee much better, but had a little issue here lately. Refill gabapentin.    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  Download 08/2014:  Good compliance, breakthru apnea on 10cm.  Changed to auto 5-12cm >> good control of AHI on f/u download ONO on CPAP:       Osteoarthritis    PCOS (polycystic ovarian syndrome)    PMB (postmenopausal bleeding)    SUI  (stress urinary incontinence, female)    h/o   Wears glasses    Past Surgical History:  Procedure Laterality Date   ANTERIOR CERVICAL DECOMP/DISCECTOMY FUSION  12/24/2017   Procedure: Cervical five-six Cervical six-seven Anterior cervical decompression/discectomy/fusion;  Surgeon: Maeola Harman, MD;  Location: [facility] OR;  Service: Neurosurgery;;   CARPAL TUNNEL RELEASE Left 12/24/2017   Procedure: LEFT CARPAL TUNNEL RELEASE;  Surgeon: Maeola Harman, MD;  [facility] OR;  Service: Neurosurgery;  Laterality: Left;   CATARACT EXTRACTION Bilateral    Right eye 08/24/2023   CESAREAN SECTION     x 1 - twins   COLONOSCOPY  2021   DILATATION & CURRETTAGE/HYSTEROSCOPY WITH RESECTOCOPE N/A 07/18/2013   Procedure: DILATATION & CURETTAGE/HYSTEROSCOPY WITH RESECTOCOPE;  Surgeon: Annamaria Boots, MD;  Location: [facility];  Service: Gynecology;  Laterality: N/A;  mass resection   HYSTEROSCOPY WITH D & C N/A 05/05/2023   Procedure: DILATATION AND CURETTAGE /HYSTEROSCOPY;  Surgeon: Jerene Bears, MD;  Location: [facility] [place];  Service: Gynecology;  Laterality: N/A;   implantable loop recorder placement  05/20/2021   Medtronic Reveal Linq model [id number] implantable loop recorder (SN [id number])   Lung tumor removed  2012   Rt lung carcinoid   MOUTH SURGERY     pre-cancerous ulcer removed   POLYPECTOMY     SHOULDER  OPEN ROTATOR CUFF REPAIR  07/20/2010   Right   WISDOM TOOTH EXTRACTION     Social History   Socioeconomic History   Marital status: Married    Spouse name: Amada Jupiter   Number of children: 2   Years of education: Not on file   Highest education level: Bachelor's degree (e.g., BA, AB, BS)  Occupational History   Occupation: retired    Associate Professor: Museum/gallery exhibitions officer  Tobacco Use   Smoking status: Never   Smokeless tobacco: Never   Tobacco comments:    Never smoked 08/07/23  Vaping Use   Vaping status: Never Used  Substance and Sexual Activity   Alcohol use: Yes     Comment: occ   Drug use: No   Sexual activity: Not Currently    Partners: Male    Birth control/protection: Post-menopausal  Other Topics Concern   Not on file  Social History Narrative   hhof 2    2 Children at college and  beyond      Hall Busing    Married    Retired age 74 2015   Recently moved taking care of grandchildren during the week.   Social Determinants of Health   Financial Resource Strain: Low Risk  (10/17/2023)   Overall Financial Resource Strain (CARDIA)    Difficulty of Paying Living Expenses: Not hard at all  Food Insecurity: No Food Insecurity (10/17/2023)   Hunger Vital Sign    Worried About Running Out of Food in the Last Year: Never true    Ran Out of Food in the Last Year: Never true  Transportation Needs: No Transportation Needs (10/17/2023)   PRAPARE - Administrator, Civil Service (Medical): No    Lack of Transportation (Non-Medical): No  Physical Activity: Insufficiently Active (10/17/2023)   Exercise Vital Sign    Days of Exercise per Week: 6 days    Minutes of Exercise per Session: 20 min  Stress: No Stress Concern Present (10/17/2023)   [employer] of Occupational Health - Occupational Stress Questionnaire    Feeling of Stress : Only a little  Social Connections: Socially Integrated (10/17/2023)   Social Connection and Isolation Panel [NHANES]    Frequency of Communication with Friends and Family: More than three times a week    Frequency of Social Gatherings with Friends and Family: Twice a week    Attends Religious Services: More than 4 times per year    Active Member of [employer] or Organizations: Yes    Attends Engineer, structural: More than 4 times per year    Marital Status: Married   Allergies  Allergen Reactions   Neomycin Other (See Comments)    Inflammation--eye drop    Ace Inhibitors Cough   Family History  Problem Relation Age of Onset   Stroke Mother        died age 12   Hypertension Mother     Sudden death Mother    Alcoholism Mother    Coronary artery disease Father        died age 38   Hypertension Father    Liver disease Father    Alcohol abuse Father    Obesity Father    Parkinson's disease Brother    Diabetes Brother    Hypertension Brother    Hypertension Maternal Uncle    Colon cancer Neg Hx    Rectal cancer Neg Hx    Stomach cancer Neg Hx    Colon polyps Neg Hx  Esophageal cancer Neg Hx     Current Outpatient Medications (Endocrine & Metabolic):    denosumab (PROLIA) 60 MG/ML SOSY injection, Inject 60 mg into the skin every 6 (six) months.   SYNTHROID 125 MCG tablet, Take 1 tablet (125 mcg total) by mouth daily before breakfast. Dosage change  Current Outpatient Medications (Cardiovascular):    atorvastatin (LIPITOR) 40 MG tablet, Take 1 tablet (40 mg total) by mouth daily.   chlorthalidone (HYGROTON) 25 MG tablet, Take 1 tablet (25 mg total) by mouth daily.   diltiazem (CARDIZEM CD) 180 MG 24 hr capsule, Take 1 capsule (180 mg total) by mouth daily.   diltiazem (CARDIZEM) 30 MG tablet, 1 tablet every 6 hours for palpitations (Patient taking differently: as needed. 1 tablet every 6 hours for palpitations)   metoprolol succinate (TOPROL-XL) 50 MG 24 hr tablet, Take 1 tablet (50 mg total) by mouth daily.  Current Outpatient Medications (Respiratory):    HYDROcodone bit-homatropine (HYCODAN) 5-1.5 MG/5ML syrup, Take 5 mLs by mouth every 8 (eight) hours as needed for cough.   Current Outpatient Medications (Hematological):    rivaroxaban (XARELTO) 20 MG TABS tablet, Take 1 tablet (20 mg total) by mouth daily with supper.  Current Outpatient Medications (Other):    Gabapentin Enacarbil ER (HORIZANT) 300 MG TBCR, Take 1 tablet (300 mg total) by mouth daily.   CALCIUM PO, Take 1 tablet by mouth at bedtime.    Doxepin HCl 3 MG TABS, TAKE 1 TABLET (3 MG TOTAL) BY MOUTH AT BEDTIME AS NEEDED.   estradiol (ESTRACE) 0.1 MG/GM vaginal cream, INSERT 1 GM VAGINALLY  TWICE WEEKLY   gabapentin (NEURONTIN) 300 MG capsule, Take 300 mg by mouth daily.   GEMTESA 75 MG TABS, Take 1 tablet by mouth daily as needed.   Multiple Vitamins-Minerals (PRESERVISION AREDS 2 PO), Take 1 tablet by mouth 2 (two) times daily.   Probiotic Product (PROBIOTIC PO), Take by mouth.   tirzepatide (ZEPBOUND) 15 MG/0.5ML Pen, Inject 15 mg into the skin once a week.   Reviewed prior external information including notes and imaging from  primary care provider As well as notes that were available from care everywhere and other healthcare systems.  Past medical history, social, surgical and family history all reviewed in electronic medical record.  No pertanent information unless stated regarding to the chief complaint.   Review of Systems:  No headache, visual changes, nausea, vomiting, diarrhea, constipation, dizziness, abdominal pain, skin rash, fevers, chills, night sweats, weight loss, swollen lymph nodes, body aches, joint swelling, chest pain, shortness of breath, mood changes. POSITIVE muscle aches  Objective  Blood pressure 116/76, pulse 69, height 4\' 11"  (1.499 m), last menstrual period 02/12/2011, SpO2 96%.   General: No apparent distress alert and oriented x3 mood and affect normal, dressed appropriately.  HEENT: Pupils equal, extraocular movements intact  Respiratory: Patient's speak in full sentences and does not appear short of breath  Cardiovascular: No lower extremity edema, non tender, no erythema  Left knee does have crepitus noted.  Does have some lateral tracking of the knee Noted.  Tenderness of the popliteal area as well.  Tenderness over the medial joint line.  Low back does have some loss lordosis noted.  Patient does have tightness with straight leg test and worsening pain with extension of the back    Impression and Recommendations:    The above documentation has been reviewed and is accurate and complete Judi Saa, DO

## 2023-10-20 NOTE — Progress Notes (Signed)
10/20/2023 Nichole Smith [id number] 03-27-1956   HISTORY OF PRESENT ILLNESS:  This is a 67 year old female who is a patient of Dr. Elana Alm.  She is here today to discuss colonoscopy.  Last was 08/2020 as below.  Colonoscopy 08/2020:  - Two 5 to 10 mm polyps in the sigmoid colon and in the proximal ascending colon, removed with a cold snare. Resected and retrieved. - Moderate diverticulosis in the sigmoid colon. - Non- bleeding internal hemorrhoids.  1. Surgical [P], colon, ascending, polyp - SESSILE SERRATED POLYP (2 OF 2 FRAGMENTS) - NO HIGH GRADE DYSPLASIA OR MALIGNANCY IDENTIFIED 2. Surgical [P], colon, sigmoid, polyp - HYPERPLASTIC POLYP (1 OF 1 FRAGMENTS) - NO HIGH GRADE DYSPLASIA OR MALIGNANCY IDENTIFIED  Repeat colonoscopy was recommended in 3 years.  She says that she feels well from a GI standpoint.  Constipation issues well controlled.  No rectal bleeding.  Is on Xarelto daily for atrial fibrillation.  Low risk as atrial fibrillation burden was low as per Dr. Elberta Fortis note last month.   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  Download 08/2014:  Good compliance, breakthru apnea on 10cm.  Changed to auto 5-12cm >> good control of AHI on f/u download ONO on CPAP:       Osteoarthritis    PCOS (polycystic ovarian syndrome)    PMB (postmenopausal bleeding)    SUI (stress urinary incontinence, female)    h/o   Wears glasses    Past Surgical History:  Procedure Laterality Date    ANTERIOR CERVICAL DECOMP/DISCECTOMY FUSION  12/24/2017   Procedure: Cervical five-six Cervical six-seven Anterior cervical decompression/discectomy/fusion;  Surgeon: Maeola Harman, MD;  Location: [facility] OR;  Service: Neurosurgery;;   CARPAL TUNNEL RELEASE Left 12/24/2017   Procedure: LEFT CARPAL TUNNEL RELEASE;  Surgeon: Maeola Harman, MD;  Location: [facility] OR;  Service: Neurosurgery;  Laterality: Left;   CATARACT EXTRACTION Bilateral    Right eye 08/24/2023   CESAREAN SECTION     x 1 - twins   COLONOSCOPY  2021   DILATATION & CURRETTAGE/HYSTEROSCOPY WITH RESECTOCOPE N/A 07/18/2013   Procedure: DILATATION & CURETTAGE/HYSTEROSCOPY WITH RESECTOCOPE;  Surgeon: Annamaria Boots, MD;  Location: [facility];  Service: Gynecology;  Laterality: N/A;  mass resection   HYSTEROSCOPY WITH D & C N/A 05/05/2023   Procedure: DILATATION AND CURETTAGE /HYSTEROSCOPY;  Surgeon: Jerene Bears, MD;  Location: [facility];  Service: Gynecology;  Laterality: N/A;   implantable loop recorder placement  05/20/2021   Medtronic Reveal Linq model [id number] implantable loop recorder (SN [id number])   Lung tumor removed  2012   Rt lung carcinoid   MOUTH SURGERY     pre-cancerous ulcer removed   POLYPECTOMY     SHOULDER OPEN ROTATOR CUFF REPAIR  07/20/2010   Right   WISDOM TOOTH EXTRACTION      reports that she has never smoked. She has never used smokeless  tobacco. She reports current alcohol use. She reports that she does not use drugs. family history includes Alcohol abuse in her father; Alcoholism in her mother; Coronary artery disease in her father; Diabetes in her brother; Hypertension in her brother, father, maternal uncle, and mother; Liver disease in her father; Obesity in her father; Parkinson's disease in her brother; Stroke in her mother; Sudden death in her mother. Allergies  Allergen Reactions   Neomycin Other (See Comments)    Inflammation--eye drop    Ace Inhibitors Cough      Outpatient  Encounter Medications as of 10/20/2023  Medication Sig   atorvastatin (LIPITOR) 40 MG tablet Take 1 tablet (40 mg total) by mouth daily.   CALCIUM PO Take 1 tablet by mouth at bedtime.    chlorthalidone (HYGROTON) 25 MG tablet Take 1 tablet (25 mg total) by mouth daily.   denosumab (PROLIA) 60 MG/ML SOSY injection Inject 60 mg into the skin every 6 (six) months.   diltiazem (CARDIZEM CD) 180 MG 24 hr capsule Take 1 capsule (180 mg total) by mouth daily.   diltiazem (CARDIZEM) 30 MG tablet 1 tablet every 6 hours for palpitations   Doxepin HCl 3 MG TABS TAKE 1 TABLET (3 MG TOTAL) BY MOUTH AT BEDTIME AS NEEDED.   estradiol (ESTRACE) 0.1 MG/GM vaginal cream INSERT 1 GM VAGINALLY TWICE WEEKLY   gabapentin (NEURONTIN) 300 MG capsule Take 300 mg by mouth daily.   GEMTESA 75 MG TABS Take 1 tablet by mouth daily as needed.   HYDROcodone bit-homatropine (HYCODAN) 5-1.5 MG/5ML syrup Take 5 mLs by mouth every 8 (eight) hours as needed for cough.   metoprolol succinate (TOPROL-XL) 50 MG 24 hr tablet Take 1 tablet (50 mg total) by mouth daily.   Multiple Vitamins-Minerals (PRESERVISION AREDS 2 PO) Take 1 tablet by mouth 2 (two) times daily.   rivaroxaban (XARELTO) 20 MG TABS tablet Take 1 tablet (20 mg total) by mouth daily with supper.   SYNTHROID 137 MCG tablet TAKE 1 TABLET DAILY (NEED TO SCHEDULE APPOINTMENT FOR MORE REFILLS. NEED TO KEEP UPCOMING APPOINTMENT)   tirzepatide (ZEPBOUND) 15 MG/0.5ML Pen Inject 15 mg into the skin once a week.   No facility-administered encounter medications on file as of 10/20/2023.    REVIEW OF SYSTEMS  : All other systems reviewed and negative except where noted in the History of Present Illness.   PHYSICAL EXAM: Ht 4\' 11"  (1.499 m)   Wt 192 lb 2 oz (87.1 kg)   LMP 02/12/2011   BMI 38.80 kg/m  General: Well developed white female in no acute distress Head: Normocephalic and atraumatic Eyes:  Sclerae anicteric, conjunctiva pink. Ears: Normal auditory  acuity Lungs: Clear throughout to auscultation; no W/R/R. Heart: Regular rate and rhythm; no M/R/G. Rectal:  Will be done at the time of colonoscopy. Musculoskeletal: Symmetrical with no gross deformities  Skin: No lesions on visible extremities Neurological: Alert oriented x 4, grossly non-focal Psychological:  Alert and cooperative. Normal mood and affect  ASSESSMENT AND PLAN: *Personal history of colon polyps:  Had a 10 mm polyp removed and a sessile serrated polyp removed in 10/221 with a repeat recommended in 3 years.  Will schedule with Dr. Lavon Paganini. *Chronic anticoagulation with Xarelto due to history of atrial fibrillation:  Will hold Xarelto for 2 days prior to endoscopic procedures - will instruct when and how to resume after procedure. Benefits and risks of procedure explained including risks of bleeding, perforation, infection, missed lesions, reactions to medications and possible need  for hospitalization and surgery for complications. Additional rare but real risk of stroke or other vascular clotting events off of Xarelto also explained and need to seek urgent help if any signs of these problems occur. Will communicate by phone or EMR with patient's prescribing provider, Dr. Elberta Fortis to confirm that holding Xarelto is reasonable in this case.     CC:  Panosh, Neta Mends, MD

## 2023-10-20 NOTE — Telephone Encounter (Signed)
[facility] Pre-operative Risk Assessment     Request for surgical clearance:     Endoscopy Procedure  What type of surgery is being performed?     Colonoscopy  When is this surgery scheduled?     11/12/23  What type of clearance is required ?   Pharmacy  Are there any medications that need to be held prior to surgery and how long? Xarelto 2 days  Practice name and name of physician performing surgery?      [facility]  What is your office phone and fax number?      Phone- [phone]  Fax- [phone]  Anesthesia type (None, local, MAC, general) ?       MAC   Please route your response to Cristela Felt, CMA

## 2023-10-20 NOTE — Telephone Encounter (Signed)
   Patient Name: Nichole Smith  DOB: [birth year] MRN: [id number]  Primary Cardiologist: Armanda Magic, MD  Clinical pharmacists have reviewed the patient's past medical history, labs, and current medications as part of preoperative protocol coverage. The following recommendations have been made:  Patient with diagnosis of afib on Xarelto for anticoagulation.     Procedure: colonoscopy Date of procedure: 11/12/23     CHA2DS2-VASc Score = 4   This indicates a 4.8% annual risk of stroke. The patient's score is based upon: CHF History: 0 HTN History: 1 Diabetes History: 0 Stroke History: 0 Vascular Disease History: 1 (CAD on CT) Age Score: 1 Gender Score: 1       CrCl 85 ml/min   Per office protocol, patient can hold Xarelto for 2 days prior to procedure.  Please resume Xarelto as soon as possible postprocedure, at the discretion of the surgeon.    I will route this recommendation to the requesting party via Epic fax function and remove from pre-op pool.  Please call with questions.  Joylene Grapes, NP 10/20/2023, 4:22 PM

## 2023-10-20 NOTE — Telephone Encounter (Signed)
Pharmacy please advise on holding Xarelto prior to colonoscopy scheduled for 11/12/2023. Thank you.

## 2023-10-21 ENCOUNTER — Ambulatory Visit: Payer: Medicare Other | Admitting: Family Medicine

## 2023-10-21 ENCOUNTER — Ambulatory Visit: Payer: Medicare Other | Attending: Physician Assistant | Admitting: Physician Assistant

## 2023-10-21 ENCOUNTER — Encounter: Payer: Self-pay | Admitting: Family Medicine

## 2023-10-21 ENCOUNTER — Encounter: Payer: Self-pay | Admitting: Physician Assistant

## 2023-10-21 VITALS — BP 116/76 | HR 69 | Ht 59.0 in

## 2023-10-21 VITALS — BP 112/66 | HR 74 | Ht 59.0 in | Wt 192.8 lb

## 2023-10-21 DIAGNOSIS — D6869 Other thrombophilia: Secondary | ICD-10-CM | POA: Diagnosis present

## 2023-10-21 DIAGNOSIS — G4733 Obstructive sleep apnea (adult) (pediatric): Secondary | ICD-10-CM

## 2023-10-21 DIAGNOSIS — E785 Hyperlipidemia, unspecified: Secondary | ICD-10-CM | POA: Diagnosis present

## 2023-10-21 DIAGNOSIS — M5416 Radiculopathy, lumbar region: Secondary | ICD-10-CM

## 2023-10-21 DIAGNOSIS — I480 Paroxysmal atrial fibrillation: Secondary | ICD-10-CM

## 2023-10-21 MED ORDER — HORIZANT 300 MG PO TBCR: 300.0000 mg | EXTENDED_RELEASE_TABLET | Freq: Every day | ORAL | 0 refills | Status: DC

## 2023-10-21 MED ORDER — ATORVASTATIN CALCIUM 40 MG PO TABS: 40.0000 mg | ORAL_TABLET | Freq: Every day | ORAL | 3 refills | Status: DC

## 2023-10-21 MED ORDER — CHLORTHALIDONE 15 MG PO TABS: 15.0000 mg | ORAL_TABLET | Freq: Every day | ORAL | 1 refills | Status: DC

## 2023-10-21 NOTE — Assessment & Plan Note (Signed)
Patient is doing relatively well on the gabapentin but continues to forget the midday dose.  Patient continues to have radicular symptoms and weakness.  I do feel that it would be a good idea for patient to consider Horizant.  Will send a new prescription and see how patient tolerates it.  On 300 mg otherwise and can go up to 400 mg twice daily if we keep forgetting the midday dosing.  Follow-up with me again in 3 months

## 2023-10-21 NOTE — Patient Instructions (Addendum)
Good to see you! Thigh compression sleeve, ice and arnica lotion to the area Keep 20-30 degree bend in knee with pedal stroke Tru pull lite brace if needed See you again in February

## 2023-10-21 NOTE — Progress Notes (Signed)
, Cardiology Office Note:  .   Date:  10/21/2023  ID:  Litsy, Drakeford 11-01-1956, MRN [id number] PCP: Madelin Headings, MD  [facility] Providers Cardiologist:  Armanda Magic, MD Electrophysiologist:  Regan Lemming, MD {  History of Present Illness: .   Nichole Smith is a 67 y.o. 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  She was recently seen by EP and denied chest pain, palpitations, dyspnea, PND, orthopnea, nausea, vomiting, dizziness, syncope, edema, weight gain, early satiety.  She has a Medtronic loop recorder implanted for atrial fibrillation balance.  Normal device functioning at that time and A-fib burden was 0.1%.  Hypertension was well-controlled.  She was compliant with her CPAP for her OSA.  Lifestyle changes were encouraged for weight management.  She was on Xarelto and it was mentioned she could consider stopping since she has such a low burden of atrial fibrillation.  Today, she presents with a history of hypertension, sleep apnea, and atrial fibrillation with a chief complaint of dry mouth. She believes this may be related to her current medication regimen. The patient has been proactive in managing her health, having lost significant weight over the past five years. Despite this, she continues to experience dry mouth and express a desire to reduce the number of medications she is taking.  The patient also mentions a high red blood cell count, which has been investigated in the past without a clear cause identified. She is currently on multiple medications including chlorthalidone, metoprolol, Xarelto, and others. The patient is also on CPAP for sleep apnea and reports good compliance with this treatment.  The patient has recently been started on Xarelto due to a CHADS-VASc score that indicated a need  for anticoagulation. She reports some bruising but no significant bleeding. The patient is due for a colonoscopy in the coming weeks and understands the need to coordinate with her healthcare providers regarding the use of Xarelto around this procedure.  Reports no shortness of breath nor dyspnea on exertion. Reports no chest pain, pressure, or tightness. No edema, orthopnea, PND. Reports no palpitations.   Discussed the use of AI scribe software for clinical note transcription with the patient, who gave verbal consent to proceed.  ROS: Pertinent ROS in HPI  Studies Reviewed: [name]       Stress test 02/08/20 Nuclear stress EF: 62%. The left ventricular ejection fraction is normal (55-65%). There was no ST segment deviation noted during stress. No T wave inversion was noted during stress. The study is normal. This is a low risk study.   Impression:   1. Normal myocardial perfusion imaging study without evidence of ischemia or infarction.  2. Normal LVEF, 62%.  3. This is a low risk study.    Risk Assessment/Calculations:    CHA2DS2-VASc Score = 4   This indicates a 4.8% annual risk of stroke. The patient's score is based upon: CHF History: 0 HTN History: 1 Diabetes History: 0 Stroke History: 0 Vascular Disease History: 1 (CAD on CT) Age Score: 1 Gender Score: 1      Physical Exam:   VS:  BP 112/66   Pulse 74   Ht 4\' 11"  (1.499 m)   Wt 192 lb 12.8 oz (87.5 kg)   LMP 02/12/2011   SpO2 97%   BMI 38.94 kg/m    Wt Readings from Last 3  Encounters:  10/21/23 192 lb 12.8 oz (87.5 kg)  10/20/23 191 lb 6.4 oz (86.8 kg)  10/20/23 192 lb 2 oz (87.1 kg)    GEN: Well nourished, well developed in no acute distress NECK: No JVD; No carotid bruits CARDIAC: RRR, no murmurs, rubs, gallops RESPIRATORY:  Clear to auscultation without rales, wheezing or rhonchi  ABDOMEN: Soft, non-tender, non-distended EXTREMITIES:  No edema; No deformity   ASSESSMENT AND PLAN: .     Hypertension Well controlled, but patient reports dry mouth and cough potentially related to chlorthalidone. Blood pressure consistently low at medical appointments. -Reduce chlorthalidone from 25mg  to 15mg  daily. -Monitor blood pressure and symptoms of dry mouth. -Check electrolytes in 1-2 weeks after starting the new dose.  Atrial Fibrillation status post Medtronic loop recorder On Xarelto, no significant bleeding reported. Upcoming colonoscopy. -Continue Xarelto. -low AF burden -Hold Xarelto as per colonoscopy protocol.  Sleep Apnea Well controlled on CPAP. Occasional use of doxepin for sleep aid. -Continue current management.  Elevated Red Blood Cells Chronic, cause unknown. No significant symptoms or complications. -No change in management.  Follow-up As needed, please follow-up annually with Dr. Elberta Fortis.     Signed, Sharlene Dory, PA-C

## 2023-10-21 NOTE — Patient Instructions (Signed)
Medication Instructions:  Decrease Chlorthalidone to 15 mg once a day - Keep Track of blood pressure for the next 2 weeks. *If you need a refill on your cardiac medications before your next appointment, please call your pharmacy*   Lab Work: BMP in 1 week okay to have done after Thanksgiving.  If you have labs (blood work) drawn today and your tests are completely normal, you will receive your results only by: MyChart Message (if you have MyChart) OR A paper copy in the mail If you have any lab test that is abnormal or we need to change your treatment, we will call you to review the results.   Testing/Procedures: No testing   Follow-Up: At [facility], you and your health needs are our priority.  As part of our continuing mission to provide you with exceptional heart care, we have created designated Provider Care Teams.  These Care Teams include your primary Cardiologist (physician) and Advanced Practice Providers (APPs -  Physician Assistants and Nurse Practitioners) who all work together to provide you with the care you need, when you need it.  We recommend signing up for the patient portal called "MyChart".  Sign up information is provided on this After Visit Summary.  MyChart is used to connect with patients for Virtual Visits (Telemedicine).  Patients are able to view lab/test results, encounter notes, upcoming appointments, etc.  Non-urgent messages can be sent to your provider as well.   To learn more about what you can do with MyChart, go to [email or web address].    Your next appointment:   1 year(s)  Provider:   Will Jorja Loa MD

## 2023-10-26 ENCOUNTER — Encounter: Payer: Self-pay | Admitting: Family Medicine

## 2023-10-27 ENCOUNTER — Other Ambulatory Visit: Payer: Self-pay

## 2023-10-27 ENCOUNTER — Other Ambulatory Visit (HOSPITAL_COMMUNITY): Admission: RE | Admit: 2023-10-27 | Payer: Medicare Other | Source: Home / Self Care

## 2023-10-27 ENCOUNTER — Ambulatory Visit (INDEPENDENT_AMBULATORY_CARE_PROVIDER_SITE_OTHER): Payer: Medicare Other

## 2023-10-27 ENCOUNTER — Encounter: Payer: Self-pay | Admitting: Internal Medicine

## 2023-10-27 ENCOUNTER — Encounter (HOSPITAL_BASED_OUTPATIENT_CLINIC_OR_DEPARTMENT_OTHER): Payer: Self-pay | Admitting: Cardiology

## 2023-10-27 ENCOUNTER — Other Ambulatory Visit (HOSPITAL_COMMUNITY)
Admission: RE | Admit: 2023-10-27 | Discharge: 2023-10-27 | Disposition: A | Discharge status: Home / Self Care | Payer: Medicare Other | Source: Other Acute Inpatient Hospital | Attending: Obstetrics and Gynecology | Admitting: Obstetrics and Gynecology

## 2023-10-27 VITALS — BP 123/61 | HR 66

## 2023-10-27 DIAGNOSIS — R310 Gross hematuria: Secondary | ICD-10-CM

## 2023-10-27 DIAGNOSIS — R350 Frequency of micturition: Secondary | ICD-10-CM | POA: Diagnosis not present

## 2023-10-27 DIAGNOSIS — R82998 Other abnormal findings in urine: Secondary | ICD-10-CM

## 2023-10-27 LAB — URINALYSIS, ROUTINE W REFLEX MICROSCOPIC
Bilirubin Urine: NEGATIVE
Glucose, UA: NEGATIVE mg/dL
Ketones, ur: NEGATIVE mg/dL
Nitrite: NEGATIVE
Protein, ur: 100 mg/dL — AB
RBC / HPF: >50 RBC/hpf (ref 0–5)
Specific Gravity, Urine: 1.006 (ref 1.005–1.030)
WBC, UA: >50 WBC/hpf (ref 0–5)
pH: 8 (ref 5.0–8.0)

## 2023-10-27 LAB — POCT URINALYSIS DIPSTICK
Bilirubin, UA: NEGATIVE
Glucose, UA: NEGATIVE
Ketones, UA: NEGATIVE
Nitrite, UA: NEGATIVE
Protein, UA: POSITIVE — AB
Spec Grav, UA: <=1.005 — AB (ref 1.010–1.025)
Urobilinogen, UA: 0.2 U/dL
pH, UA: 8.5 — AB (ref 5.0–8.0)

## 2023-10-27 MED ORDER — GABAPENTIN 400 MG PO CAPS: 400.0000 mg | ORAL_CAPSULE | Freq: Two times a day (BID) | ORAL | 1 refills | Status: DC

## 2023-10-27 MED ORDER — FOSFOMYCIN TROMETHAMINE 3 G PO PACK: 3.0000 g | PACK | Freq: Once | ORAL | 0 refills | Status: AC

## 2023-10-27 MED ORDER — PHENAZOPYRIDINE HCL 95 MG PO TABS: 95.0000 mg | ORAL_TABLET | Freq: Three times a day (TID) | ORAL | 1 refills | Status: DC | PRN

## 2023-10-28 ENCOUNTER — Other Ambulatory Visit: Payer: Self-pay | Admitting: Obstetrics and Gynecology

## 2023-10-28 ENCOUNTER — Telehealth: Payer: Self-pay | Admitting: Obstetrics and Gynecology

## 2023-10-28 DIAGNOSIS — R82998 Other abnormal findings in urine: Secondary | ICD-10-CM

## 2023-10-28 MED ORDER — NITROFURANTOIN MONOHYD MACRO 100 MG PO CAPS: 100.0000 mg | ORAL_CAPSULE | Freq: Two times a day (BID) | ORAL | 0 refills | Status: AC

## 2023-10-28 NOTE — Telephone Encounter (Signed)
Patient is requesting a different antibiotic due to her not feeling better and not wanting to be without anything medication over the holiday weekend. Please advise

## 2023-10-28 NOTE — Telephone Encounter (Signed)
I have sent Macrobid in at her request due to the holiday weekend

## 2023-10-28 NOTE — Telephone Encounter (Signed)
Called and left message for patient returning her call. The once dose of Fosfomycin should assist in her symptoms which may be why she is feeling better. I don't want to over-prescribed antibiotics but I will make every effort to check the pathnostics Friday morning to check if it is resistant to Fosfomycin.

## 2023-10-28 NOTE — Patient Instructions (Signed)
Your Urine dip that was done in office was Positive. I am sending the urine off for culture and you can take AZO over the counter for your discomfort.  We have also ordered Fosfomycin for you to take while we wait for your culture results, hopefully this gives you some relief. We will contact you when the results are back between 3-5 days. If a different antibiotic is needed we will sent the order to the pharmacy and you will be notified. If you have any questions or concerns please feel free to call us at [phone]

## 2023-10-28 NOTE — Progress Notes (Signed)
Nichole Smith is a 67 y.o. female arrived today with UTI sx.  Per Dr. Jari Favre protocol: A urine specimen was collected and POCT Urine was done and urine culture sent to the lab. POCT Urine was Positive  Pt was notified and prescription sent to the preferred pharmacy.

## 2023-10-28 NOTE — Telephone Encounter (Signed)
Pt called in today stating she is still bleeding some.  Her symptoms are better, but she does not want to go over the holiday weekend without a plan if things get worse.  Please advise.

## 2023-10-28 NOTE — Progress Notes (Signed)
Cxray no active disease

## 2023-10-29 LAB — URINE CULTURE: Culture: >=100000 — AB

## 2023-10-31 ENCOUNTER — Encounter: Payer: Self-pay | Admitting: Obstetrics

## 2023-10-31 ENCOUNTER — Other Ambulatory Visit: Payer: Self-pay | Admitting: Obstetrics and Gynecology

## 2023-10-31 DIAGNOSIS — N3000 Acute cystitis without hematuria: Secondary | ICD-10-CM

## 2023-10-31 MED ORDER — SULFAMETHOXAZOLE-TRIMETHOPRIM 800-160 MG PO TABS: 1.0000 | ORAL_TABLET | Freq: Two times a day (BID) | ORAL | 0 refills | Status: AC

## 2023-11-01 LAB — CUP PACEART REMOTE DEVICE CHECK
Date Time Interrogation Session: 20241129230243
Implantable Pulse Generator Implant Date: 20220620

## 2023-11-02 ENCOUNTER — Ambulatory Visit (INDEPENDENT_AMBULATORY_CARE_PROVIDER_SITE_OTHER): Payer: Medicare Other

## 2023-11-02 DIAGNOSIS — I480 Paroxysmal atrial fibrillation: Secondary | ICD-10-CM | POA: Diagnosis not present

## 2023-11-02 NOTE — Telephone Encounter (Signed)
Patient informed she may hold Xarelto.

## 2023-11-03 ENCOUNTER — Telehealth: Payer: Self-pay

## 2023-11-03 MED ORDER — CHLORTHALIDONE 25 MG PO TABS: 25.0000 mg | ORAL_TABLET | Freq: Every day | ORAL | 3 refills | Status: DC

## 2023-11-03 NOTE — Telephone Encounter (Signed)
Call to patient to find out if she wants to stop her chlorthalidone or if she wants to continue on it. Patient states she will continue on chlorthalidone for now since Julian Hy is happy with her blood pressure readings. Chart updated to reflect she is taking 25 mg of chlorthalidone daily. She is not taking the thalitone that Tessa prescribed because her insurance would not cover it.  Patient states that what she is concerned about is her heart rate. She states that she was told that she was having very high heart rates on her loop recorder when she has episodes of afib. She was told she may need to increase her dose of diltiazem but she is concerned that it will affect her blood pressure. She is asking that Dr. Elberta Fortis review her blood pressure readings that she has submitted while she has been on the 25 mg dose of chlorthalidone and advise her what increased dose of diltiazem is safe for her. Forwarded to Dr. Elberta Fortis and his nurse.

## 2023-11-04 NOTE — Telephone Encounter (Signed)
No afib/HR episodes since October, no changes at this time.   Continue monitoring. Pt made aware and agreeable to plan.

## 2023-11-08 ENCOUNTER — Other Ambulatory Visit: Payer: Self-pay | Admitting: Cardiology

## 2023-11-12 ENCOUNTER — Ambulatory Visit: Payer: Medicare Other | Admitting: Gastroenterology

## 2023-11-12 ENCOUNTER — Encounter: Payer: Self-pay | Admitting: Gastroenterology

## 2023-11-12 VITALS — BP 135/78 | HR 73 | Temp 98.1°F | Resp 15 | Ht 59.0 in | Wt 192.0 lb

## 2023-11-12 DIAGNOSIS — Z1211 Encounter for screening for malignant neoplasm of colon: Secondary | ICD-10-CM | POA: Diagnosis not present

## 2023-11-12 DIAGNOSIS — K639 Disease of intestine, unspecified: Secondary | ICD-10-CM | POA: Diagnosis not present

## 2023-11-12 DIAGNOSIS — K635 Polyp of colon: Secondary | ICD-10-CM | POA: Diagnosis not present

## 2023-11-12 DIAGNOSIS — K5730 Diverticulosis of large intestine without perforation or abscess without bleeding: Secondary | ICD-10-CM | POA: Diagnosis not present

## 2023-11-12 DIAGNOSIS — K648 Other hemorrhoids: Secondary | ICD-10-CM

## 2023-11-12 DIAGNOSIS — D122 Benign neoplasm of ascending colon: Secondary | ICD-10-CM

## 2023-11-12 DIAGNOSIS — Z860100 Personal history of colon polyps, unspecified: Secondary | ICD-10-CM

## 2023-11-12 DIAGNOSIS — K644 Residual hemorrhoidal skin tags: Secondary | ICD-10-CM

## 2023-11-12 MED ORDER — SODIUM CHLORIDE 0.9 % IV SOLN: 500.0000 mL | INTRAVENOUS | Status: DC

## 2023-11-12 NOTE — Progress Notes (Signed)
Pt's states no medical or surgical changes since previsit or office visit. 

## 2023-11-12 NOTE — Progress Notes (Signed)
Report given to PACU, vss 

## 2023-11-12 NOTE — Patient Instructions (Addendum)
-   Resume previous diet. - Continue present medications. - Await pathology results. - Repeat colonoscopy in 3 - 5 years for surveillance based on pathology results. - Resume Xarelto (rivaroxaban) at prior dose tomorrow. Refer to managing physician for further adjustment of therapy.  YOU HAD AN ENDOSCOPIC PROCEDURE TODAY AT THE [facility]:   Refer to the procedure report that was given to you for any specific questions about what was found during the examination.  If the procedure report does not answer your questions, please call your gastroenterologist to clarify.  If you requested that your care partner not be given the details of your procedure findings, then the procedure report has been included in a sealed envelope for you to review at your convenience later.  YOU SHOULD EXPECT: Some feelings of bloating in the abdomen. Passage of more gas than usual.  Walking can help get rid of the air that was put into your GI tract during the procedure and reduce the bloating. If you had a lower endoscopy (such as a colonoscopy or flexible sigmoidoscopy) you may notice spotting of blood in your stool or on the toilet paper. If you underwent a bowel prep for your procedure, you may not have a normal bowel movement for a few days.  Please Note:  You might notice some irritation and congestion in your nose or some drainage.  This is from the oxygen used during your procedure.  There is no need for concern and it should clear up in a day or so.  SYMPTOMS TO REPORT IMMEDIATELY:  Following lower endoscopy (colonoscopy or flexible sigmoidoscopy):  Excessive amounts of blood in the stool  Significant tenderness or worsening of abdominal pains  Swelling of the abdomen that is new, acute  Fever of 100F or higher   For urgent or emergent issues, a gastroenterologist can be reached at any hour by calling [phone]. Do not use MyChart messaging for urgent concerns.    DIET:  We do recommend a  small meal at first, but then you may proceed to your regular diet.  Drink plenty of fluids but you should avoid alcoholic beverages for 24 hours.  ACTIVITY:  You should plan to take it easy for the rest of today and you should NOT DRIVE or use heavy machinery until tomorrow (because of the sedation medicines used during the test).    FOLLOW UP: Our staff will call the number listed on your records the next business day following your procedure.  We will call around 7:15- 8:00 am to check on you and address any questions or concerns that you may have regarding the information given to you following your procedure. If we do not reach you, we will leave a message.     If any biopsies were taken you will be contacted by phone or by letter within the next 1-3 weeks.  Please call us at [phone] if you have not heard about the biopsies in 3 weeks.    SIGNATURES/CONFIDENTIALITY: You and/or your care partner have signed paperwork which will be entered into your electronic medical record.  These signatures attest to the fact that that the information above on your After Visit Summary has been reviewed and is understood.  Full responsibility of the confidentiality of this discharge information lies with you and/or your care-partner.

## 2023-11-12 NOTE — Progress Notes (Signed)
Called to room to assist during endoscopic procedure.  Patient ID and intended procedure confirmed with present staff. Received instructions for my participation in the procedure from the performing physician.  

## 2023-11-12 NOTE — Op Note (Signed)
[facility] Patient Name: Nichole Smith Procedure Date: 11/12/2023 8:14 AM MRN: [id number] Endoscopist: Napoleon Form , MD, [id number] Age: 67 Referring MD:  Date of Birth: [birth year] Gender: Female Account #: [id number] Procedure:                Colonoscopy Indications:              High risk colon cancer surveillance: Personal                            history of colonic polyps, High risk colon cancer                            surveillance: Personal history of adenoma (10 mm or                            greater in size), High risk colon cancer                            surveillance: Personal history of multiple (3 or                            more) adenomas Medicines:                Monitored Anesthesia Care Procedure:                Pre-Anesthesia Assessment:                           - Prior to the procedure, a History and Physical                            was performed, and patient medications and                            allergies were reviewed. The patient's tolerance of                            previous anesthesia was also reviewed. The risks                            and benefits of the procedure and the sedation                            options and risks were discussed with the patient.                            All questions were answered, and informed consent                            was obtained. Prior Anticoagulants: The patient                            last took Xarelto (rivaroxaban) 2 days prior to the  procedure. ASA Grade Assessment: II - A patient                            with mild systemic disease. After reviewing the                            risks and benefits, the patient was deemed in                            satisfactory condition to undergo the procedure.                           After obtaining informed consent, the colonoscope                            was passed under direct vision.  Throughout the                            procedure, the patient's blood pressure, pulse, and                            oxygen saturations were monitored continuously. The                            Olympus Scope [id number] was introduced through the                            anus and advanced to the the cecum, identified by                            appendiceal orifice and ileocecal valve. The                            colonoscopy was performed without difficulty. The                            patient tolerated the procedure well. The quality                            of the bowel preparation was good. The ileocecal                            valve, appendiceal orifice, and rectum were                            photographed. Scope In: 8:31:05 AM Scope Out: 8:45:51 AM Scope Withdrawal Time: 0 hours 10 minutes 25 seconds  Total Procedure Duration: 0 hours 14 minutes 46 seconds  Findings:                 The perianal and digital rectal examinations were                            normal.  Two sessile polyps were found in the ascending                            colon. The polyps were 3 to 10 mm in size. These                            polyps were removed with a cold snare. Resection                            and retrieval were complete.                           A few small-mouthed diverticula were found in the                            sigmoid colon.                           Non-bleeding external and internal hemorrhoids were                            found during retroflexion. The hemorrhoids were                            medium-sized. Complications:            No immediate complications. Estimated Blood Loss:     Estimated blood loss was minimal. Impression:               - Two 3 to 10 mm polyps in the ascending colon,                            removed with a cold snare. Resected and retrieved.                           - Diverticulosis in the  sigmoid colon.                           - Non-bleeding external and internal hemorrhoids. Recommendation:           - Resume previous diet.                           - Continue present medications.                           - Await pathology results.                           - Repeat colonoscopy in 3 - 5 years for                            surveillance based on pathology results.                           - Resume Xarelto (rivaroxaban) at prior dose  tomorrow. Refer to managing physician for further                            adjustment of therapy. Napoleon Form, MD 11/12/2023 8:50:43 AM This report has been signed electronically.

## 2023-11-12 NOTE — Progress Notes (Signed)
[facility] History and Physical   Primary Care Physician:  Madelin Headings, MD   Reason for Procedure:  History of adenomatous colon polyps  Plan:    Surveillance colonoscopy with possible interventions as needed     HPI: Nichole Smith is a very pleasant 67 y.o. female here for surveillance colonoscopy. Denies any nausea, vomiting, abdominal pain, melena or bright red blood per rectum  The risks and benefits as well as alternatives of endoscopic procedure(s) have been discussed and reviewed. All questions answered. The patient agrees to proceed.    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  Download 08/2014:  Good compliance, breakthru apnea on 10cm.  Changed to auto 5-12cm >> good control of AHI on f/u download ONO on CPAP:       Osteoarthritis    PCOS (polycystic ovarian syndrome)    PMB (postmenopausal bleeding)    SUI (stress urinary incontinence, female)    h/o   Wears glasses     Past Surgical History:  Procedure Laterality Date   ANTERIOR CERVICAL DECOMP/DISCECTOMY FUSION  12/24/2017   Procedure: Cervical five-six Cervical six-seven Anterior cervical decompression/discectomy/fusion;  Surgeon: Maeola Harman, MD;  Location: [facility] OR;  Service: Neurosurgery;;   CARPAL TUNNEL RELEASE Left 12/24/2017   Procedure: LEFT CARPAL TUNNEL RELEASE;  Surgeon: Maeola Harman, MD;  Location: [facility] OR;  Service: Neurosurgery;  Laterality: Left;   CATARACT EXTRACTION Bilateral    Right eye 08/24/2023    CESAREAN SECTION     x 1 - twins   COLONOSCOPY  2021   DILATATION & CURRETTAGE/HYSTEROSCOPY WITH RESECTOCOPE N/A 07/18/2013   Procedure: DILATATION & CURETTAGE/HYSTEROSCOPY WITH RESECTOCOPE;  Surgeon: Annamaria Boots, MD;  Location: [facility];  Service: Gynecology;  Laterality: N/A;  mass resection   HYSTEROSCOPY WITH D & C N/A 05/05/2023   Procedure: DILATATION AND CURETTAGE /HYSTEROSCOPY;  Surgeon: Jerene Bears, MD;  Location: [facility];  Service: Gynecology;  Laterality: N/A;   implantable loop recorder placement  05/20/2021   Medtronic Reveal Linq model [id number] implantable loop recorder (SN [id number])   Lung tumor removed  2012   Rt lung carcinoid   MOUTH SURGERY     pre-cancerous ulcer removed   POLYPECTOMY     SHOULDER OPEN ROTATOR CUFF REPAIR  07/20/2010   Right   WISDOM TOOTH EXTRACTION      Prior to Admission medications   Medication Sig Start Date End Date Taking? Authorizing Provider  atorvastatin (LIPITOR) 40 MG tablet Take 1 tablet (40 mg total) by mouth daily. 10/21/23  Yes Conte, Tessa N, PA-C  CALCIUM PO Take 1 tablet by mouth at bedtime.    Yes [provider]  chlorthalidone (HYGROTON) 25 MG tablet TAKE 1 TABLET DAILY 11/11/23  Yes Sharlene Dory, PA-C  diltiazem (CARDIZEM CD) 180 MG 24 hr capsule TAKE 1 CAPSULE DAILY 11/11/23  Yes Conte, Tessa N, PA-C  estradiol (ESTRACE) 0.1  MG/GM vaginal cream INSERT 1 GM VAGINALLY TWICE WEEKLY 08/27/23  Yes Jerene Bears, MD  gabapentin (NEURONTIN) 400 MG capsule Take 1 capsule (400 mg total) by mouth 2 (two) times daily. 10/27/23  Yes Antoine Primas M, DO  GEMTESA 75 MG TABS Take 1 tablet by mouth daily as needed. 09/11/21  Yes [provider]  metoprolol succinate (TOPROL-XL) 50 MG 24 hr tablet Take 1 tablet (50 mg total) by mouth daily. 10/20/22  Yes Quintella Reichert, MD  Probiotic Product (PROBIOTIC PO) Take by mouth.   Yes [provider]  rivaroxaban (XARELTO) 20 MG TABS tablet  Take 1 tablet (20 mg total) by mouth daily with supper. 08/10/23  Yes Fenton, Clint R, PA  SYNTHROID 125 MCG tablet Take 1 tablet (125 mcg total) by mouth daily before breakfast. Dosage change 10/20/23  Yes Panosh, Neta Mends, MD  denosumab (PROLIA) 60 MG/ML SOSY injection Inject 60 mg into the skin every 6 (six) months.    [provider]  diltiazem (CARDIZEM) 30 MG tablet 1 tablet every 6 hours for palpitations Patient taking differently: as needed. 1 tablet every 6 hours for palpitations 07/25/22   Camnitz, Andree Coss, MD  Doxepin HCl 3 MG TABS TAKE 1 TABLET (3 MG TOTAL) BY MOUTH AT BEDTIME AS NEEDED. 08/17/23   Coralyn Helling, MD  HYDROcodone bit-homatropine (HYCODAN) 5-1.5 MG/5ML syrup Take 5 mLs by mouth every 8 (eight) hours as needed for cough. 10/20/23   Panosh, Neta Mends, MD  Multiple Vitamins-Minerals (PRESERVISION AREDS 2 PO) Take 1 tablet by mouth 2 (two) times daily. Patient not taking: Reported on 11/12/2023    [provider]  phenazopyridine (PYRIDIUM) 95 MG tablet Take 1 tablet (95 mg total) by mouth 3 (three) times daily as needed. Patient not taking: Reported on 11/12/2023 10/27/23   Selmer Dominion, NP  tirzepatide (ZEPBOUND) 15 MG/0.5ML Pen Inject 15 mg into the skin once a week. 09/09/23   Rayburn, Fanny Bien, PA-C    Current Outpatient Medications  Medication Sig Dispense Refill   atorvastatin (LIPITOR) 40 MG tablet Take 1 tablet (40 mg total) by mouth daily. 90 tablet 3   CALCIUM PO Take 1 tablet by mouth at bedtime.      chlorthalidone (HYGROTON) 25 MG tablet TAKE 1 TABLET DAILY 90 tablet 3   diltiazem (CARDIZEM CD) 180 MG 24 hr capsule TAKE 1 CAPSULE DAILY 90 capsule 3   estradiol (ESTRACE) 0.1 MG/GM vaginal cream INSERT 1 GM VAGINALLY TWICE WEEKLY 42.5 g 3   gabapentin (NEURONTIN) 400 MG capsule Take 1 capsule (400 mg total) by mouth 2 (two) times daily. 180 capsule 1   GEMTESA 75 MG TABS Take 1 tablet by mouth daily as needed.     metoprolol  succinate (TOPROL-XL) 50 MG 24 hr tablet Take 1 tablet (50 mg total) by mouth daily. 90 tablet 3   Probiotic Product (PROBIOTIC PO) Take by mouth.     rivaroxaban (XARELTO) 20 MG TABS tablet Take 1 tablet (20 mg total) by mouth daily with supper. 90 tablet 2   SYNTHROID 125 MCG tablet Take 1 tablet (125 mcg total) by mouth daily before breakfast. Dosage change 90 tablet 3   denosumab (PROLIA) 60 MG/ML SOSY injection Inject 60 mg into the skin every 6 (six) months.     diltiazem (CARDIZEM) 30 MG tablet 1 tablet every 6 hours for palpitations (Patient taking differently: as needed. 1 tablet every 6 hours for palpitations) 60 tablet 3   Doxepin HCl  3 MG TABS TAKE 1 TABLET (3 MG TOTAL) BY MOUTH AT BEDTIME AS NEEDED. 30 tablet 2   HYDROcodone bit-homatropine (HYCODAN) 5-1.5 MG/5ML syrup Take 5 mLs by mouth every 8 (eight) hours as needed for cough. 120 mL 0   Multiple Vitamins-Minerals (PRESERVISION AREDS 2 PO) Take 1 tablet by mouth 2 (two) times daily. (Patient not taking: Reported on 11/12/2023)     phenazopyridine (PYRIDIUM) 95 MG tablet Take 1 tablet (95 mg total) by mouth 3 (three) times daily as needed. (Patient not taking: Reported on 11/12/2023) 30 tablet 1   tirzepatide (ZEPBOUND) 15 MG/0.5ML Pen Inject 15 mg into the skin once a week. 6 mL 0   Current Facility-Administered Medications  Medication Dose Route Frequency Provider Last Rate Last Admin   0.9 %  sodium chloride infusion  500 mL Intravenous Continuous Delaine Canter, Eleonore Chiquito, MD        Allergies as of 11/12/2023 - Review Complete 11/12/2023  Allergen Reaction Noted   Neomycin Other (See Comments)    Ace inhibitors Cough 02/04/2013    Family History  Problem Relation Age of Onset   Stroke Mother        died age 69   Hypertension Mother    Sudden death Mother    Alcoholism Mother    Coronary artery disease Father        died age 22   Hypertension Father    Liver disease Father    Alcohol abuse Father    Obesity Father     Parkinson's disease Brother    Diabetes Brother    Hypertension Brother    Hypertension Maternal Uncle    Colon cancer Neg Hx    Rectal cancer Neg Hx    Stomach cancer Neg Hx    Colon polyps Neg Hx    Esophageal cancer Neg Hx     Social History   Socioeconomic History   Marital status: Married    Spouse name: Amada Jupiter   Number of children: 2   Years of education: Not on file   Highest education level: Bachelor's degree (e.g., BA, AB, BS)  Occupational History   Occupation: retired    Associate Professor: Museum/gallery exhibitions officer  Tobacco Use   Smoking status: Never   Smokeless tobacco: Never   Tobacco comments:    Never smoked 08/07/23  Vaping Use   Vaping status: Never Used  Substance and Sexual Activity   Alcohol use: Yes    Comment: occ   Drug use: No   Sexual activity: Not Currently    Partners: Male    Birth control/protection: Post-menopausal  Other Topics Concern   Not on file  Social History Narrative   hhof 2    2 Children at college and  beyond      [place]    Married    Retired age 67 2015   Recently moved taking care of grandchildren during the week.   Social Drivers of Corporate investment banker Strain: Low Risk  (10/17/2023)   Overall Financial Resource Strain (CARDIA)    Difficulty of Paying Living Expenses: Not hard at all  Food Insecurity: No Food Insecurity (10/17/2023)   Hunger Vital Sign    Worried About Running Out of Food in the Last Year: Never true    Ran Out of Food in the Last Year: Never true  Transportation Needs: No Transportation Needs (10/17/2023)   PRAPARE - Administrator, Civil Service (Medical): No    Lack  of Transportation (Non-Medical): No  Physical Activity: Insufficiently Active (10/17/2023)   Exercise Vital Sign    Days of Exercise per Week: 6 days    Minutes of Exercise per Session: 20 min  Stress: No Stress Concern Present (10/17/2023)   [employer] of Occupational Health - Occupational Stress  Questionnaire    Feeling of Stress : Only a little  Social Connections: Socially Integrated (10/17/2023)   Social Connection and Isolation Panel [NHANES]    Frequency of Communication with Friends and Family: More than three times a week    Frequency of Social Gatherings with Friends and Family: Twice a week    Attends Religious Services: More than 4 times per year    Active Member of [employer] or Organizations: Yes    Attends Engineer, structural: More than 4 times per year    Marital Status: Married  Catering manager Violence: Not At Risk (11/12/2022)   Humiliation, Afraid, Rape, and Kick questionnaire    Fear of Current or Ex-Partner: No    Emotionally Abused: No    Physically Abused: No    Sexually Abused: No    Review of Systems:  All other review of systems negative except as mentioned in the HPI.  Physical Exam: Vital signs in last 24 hours: BP 106/71   Pulse 67   Temp 98.1 F (36.7 C)   Ht 4\' 11"  (1.499 m)   Wt 192 lb (87.1 kg)   LMP 02/12/2011   SpO2 95%   BMI 38.78 kg/m  General:   Alert, NAD Lungs:  Clear .   Heart:  Regular rate and rhythm Abdomen:  Soft, nontender and nondistended. Neuro/Psych:  Alert and cooperative. Normal mood and affect. A and O x 3  Reviewed labs, radiology imaging, old records and pertinent past GI work up  Patient is appropriate for planned procedure(s) and anesthesia in an ambulatory setting   K. Scherry Ran , MD [phone]

## 2023-11-13 ENCOUNTER — Telehealth: Payer: Self-pay | Admitting: *Deleted

## 2023-11-13 NOTE — Telephone Encounter (Signed)
Post procedure follow up phone call. No answer at number given.  Left message on voicemail.  

## 2023-11-17 LAB — SURGICAL PATHOLOGY

## 2023-11-18 ENCOUNTER — Ambulatory Visit (INDEPENDENT_AMBULATORY_CARE_PROVIDER_SITE_OTHER): Payer: Medicare Other | Admitting: Physician Assistant

## 2023-11-18 ENCOUNTER — Encounter (INDEPENDENT_AMBULATORY_CARE_PROVIDER_SITE_OTHER): Payer: Self-pay | Admitting: Physician Assistant

## 2023-11-18 VITALS — BP 103/69 | HR 65 | Temp 98.5°F | Ht 59.0 in | Wt 186.0 lb

## 2023-11-18 DIAGNOSIS — R632 Polyphagia: Secondary | ICD-10-CM | POA: Diagnosis not present

## 2023-11-18 DIAGNOSIS — Z6837 Body mass index (BMI) 37.0-37.9, adult: Secondary | ICD-10-CM

## 2023-11-18 DIAGNOSIS — R7303 Prediabetes: Secondary | ICD-10-CM | POA: Diagnosis not present

## 2023-11-18 DIAGNOSIS — I2510 Atherosclerotic heart disease of native coronary artery without angina pectoris: Secondary | ICD-10-CM

## 2023-11-18 DIAGNOSIS — E669 Obesity, unspecified: Secondary | ICD-10-CM | POA: Diagnosis not present

## 2023-11-18 MED ORDER — ZEPBOUND 15 MG/0.5ML ~~LOC~~ SOAJ
15.0000 mg | SUBCUTANEOUS | 0 refills | Status: DC
Start: 2023-11-18 — End: 2023-12-30

## 2023-11-18 NOTE — Progress Notes (Unsigned)
SUBJECTIVE:  Chief Complaint: Obesity Discussed the use of AI scribe software for clinical note transcription with the patient, who gave verbal consent to proceed.  History of Present Illness     Interim History: Nichole Smith is down 1 lb since last visit.   Nichole Smith, a 68 year old patient with a history of prediabetes, hypertension, and coronary artery calcification, presents for a follow-up visit regarding her obesity treatment plan. The patient has been on Zepbound 15mg  weekly for medical weight loss in addition to managing her coronary artery disease risks.   Recent weight loss, even during the holiday season, attributing this to her ongoing treatment with Zepbound. However, she also acknowledges some challenges in maintaining her diet, particularly during social events. She notes a specific instance where she overindulged in a potluck, which she believes derailed her dietary discipline for some time. Despite these setbacks, the patient has been diligent in tracking her daily caloric intake, aiming to stay below 1500 calories per day for weight loss.  The patient also discusses her physical activity, which has been inconsistent due to cold weather and a recent hamstring injury. She typically attends a water class twice a week and walks her dog daily for 20-30 minutes. She also does strength training at home but admits to falling off this routine recently.  The patient has a history of erythrocytosis, which has been a concern due to her increased risk for clots. She is currently on Xarelto, which was started a couple of months ago. She also has a history of low oxygen at night/sleep apnea which led to her using a CPAP machine. This was felt to possibly be the etiology of her erythrocytosis, but there has not been much improvement in her high Hgb/Hct levels with consistent CPAP use.   The patient is also on Lipitor for her cholesterol and takes over-the-counter Vitamin D supplements. She reports  good compliance with her CPAP machine and her medications.   In summary, the patient is actively engaged in her weight loss journey, utilizing medication, dietary changes, and physical activity. She has experienced some setbacks but remains committed to her health goals.   Nichole Smith is here to discuss her progress with her obesity treatment plan. She is on the keeping a food journal and adhering to recommended goals of 1200-1300 calories and 90 protein and states she is following her eating plan approximately 90 % of the time. She states she is exercising water aerobics, walking, strength training 15 minutes 7 times per week.  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  OBJECTIVE: Visit Diagnoses: Problem List Items Addressed This Visit     Coronary artery calcification   Relevant Medications   tirzepatide (ZEPBOUND) 15 MG/0.5ML Pen   Prediabetes   Relevant Medications   tirzepatide (ZEPBOUND) 15 MG/0.5ML Pen   Polyphagia - Primary   Relevant Medications   tirzepatide (ZEPBOUND) 15 MG/0.5ML Pen   Obesity, Starting BMI 46.48   Relevant Medications   tirzepatide (ZEPBOUND) 15 MG/0.5ML Pen   Other Visit Diagnoses       BMI 37.0-37.9, adult, current BMI 37.7         Obesity with Polyphagia 67 year old with obesity, on Zepbound 15 mg weekly for weight loss. Despite dietary lapses, managed weight loss over Thanksgiving. Challenges with carbohydrate sensitivity and maintaining a low-calorie diet. Discussed based on her metabolic testing done previously, that consuming 1500-1600 calories daily will not promote further weight  loss. Focus on increasing protein intake and reducing carbohydrate consumption. Discussed tracking daily caloric intake and benefits of Fairlife milk for additional protein. Encouraged regular physical activity, including water classes and strength  training. - Continue Zepbound 15 mg weekly - Encourage tracking daily caloric intake - Increase protein intake, consider Fairlife milk for additional protein - Avoid high-carbohydrate foods - Encourage regular physical activity, including water classes and strength training - Follow up in six weeks  Prediabetes Lab Results  Component Value Date   HGBA1C 5.3 10/07/2023   HGBA1C 5.6 02/18/2023   HGBA1C 5.7 08/18/2022   Lab Results  Component Value Date   LDLCALC 68 10/07/2023   CREATININE 0.60 10/07/2023   Insulin 13.9- stable and improved overall.  Prediabetes with A1c of 5.3, indicating good control. Mild insulin resistance likely due to carbohydrate sensitivity and age-related decline in pancreatic function. Discussed potential use of continuous glucose monitor (Stelo) for better understanding of carbohydrate sensitivity. - Continue current dietary modifications to manage carbohydrate intake - Monitor blood glucose levels periodically - Consider continuous glucose monitor (Stelo) for personal use Continue working on nutrition plan to decrease simple carbohydrates, increase lean proteins and exercise to promote weight loss, improve glycemic control and prevent progression to Type 2 diabetes.   Hypertension Hypertension, well-managed. No specific issues discussed during this visit. - Continue current antihypertensive medications - Monitor blood pressure regularly  Coronary Artery Calcification Coronary artery calcification, on Lipitor. Recent lipid panel shows excellent control with total cholesterol, HDL, LDL, and triglycerides within target ranges. Discussed potential for more aggressive cholesterol management with newer medications like Repatha. Family history and personal history considered in decision-making. - Continue Lipitor Continue Zepbound for CV risk reduction in addition to benefits for glycemic control /effects on polyphagia and possible positive effects to lipid  profile.  - Discuss potential for additional cholesterol-lowering medications with cardiologist - Monitor lipid levels regularly  Erythrocytosis Seen by hematology in 2015- Dr. Bertis Ruddy For chronic erythrocytosis. Was felt to be related to undiagnosed sleep apnea at that time.  The patient have chronic elevated hemoglobin since 2008 to present. The number ranges from 15-16.1. Most the of the blood work was done in the setting of routine primary care visit.She denies prior diagnosis of blood clot. Denies smoking history. ASSESSMENT & PLAN: from last visit with hematology.  #1 chronic erythrocytosis #2 history of carcinoid tumor status post lung resection I suspect the patient may have undiagnosed obstructive sleep apnea. Peripheral blood test for erythropoietin level and JAK2 mutation was negative. I recommend pulmonologist evaluation and she agreed to proceed. In the meantime I recommend she take aspirin indefinitely.  Last visit with pulmonary- Dr. Craige Cotta 07/31/23: Obstructive sleep apnea. - she is compliant with CPAP and reports benefit from therapy - she uses [employer] for her DME - current CPAP ordered January 2022 - will change CPAP to 14 cm H2O   She is now on full anticoagulation per cardiology- Xarelto- due to a CHADS-VASc score that indicated a need for anticoagulation.     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 On CPAP therapy and Xarelto. Hematology evaluation in the past ruled out other causes. Patient expresses concern about the condition. ? potential need for periodic venipuncture to manage blood viscosity. - Review hematology notes and consider referral back to hematology for further evaluation - Continue CPAP therapy - Continue Xarelto  General Health Maintenance Recent blood work shows normal kidney function, electrolytes, and liver function. Vitamin D levels are at goal with current supplementation. Discussed  importance of monitoring vitamin D levels  to avoid hypervitaminosis D. - Continue current vitamin D supplementation - Monitor vitamin D levels periodically to avoid hypervitaminosis D  Follow-up - Follow up in six weeks on January 29th at 10 AM.  Vitals Temp: 98.5 F (36.9 C) BP: 103/69 Pulse Rate: 65 SpO2: 93 %   Anthropometric Measurements Height: 4\' 11"  (1.499 m) Weight: 186 lb (84.4 kg) BMI (Calculated): 37.55 Weight at Last Visit: 187 lb Weight Lost Since Last Visit: 1 lb Weight Gained Since Last Visit: 0 Starting Weight: 238 lb Total Weight Loss (lbs): 52 lb (23.6 kg) Peak Weight: 238 lb   Body Composition  Body Fat %: 48.8 % Fat Mass (lbs): 91.2 lbs Muscle Mass (lbs): 90.8 lbs Total Body Water (lbs): 71.2 lbs Visceral Fat Rating : 16   Other Clinical Data Fasting: no Labs: no Today's Visit #: 24 Starting Date: 01/12/19     ASSESSMENT AND PLAN:  Diet: Nianna is currently in the action stage of change. As such, her goal is to continue with weight loss efforts. She has agreed to keeping a food journal and adhering to recommended goals of 1200-1300 calories and 90 grams of protein.  Exercise: Nichole Smith has been instructed to work up to a goal of 150 minutes of combined cardio and strengthening exercise per week for weight loss and overall health benefits.   Behavior Modification:  We discussed the following Behavioral Modification Strategies today: increasing lean protein intake, decreasing simple carbohydrates, increasing vegetables, increase H2O intake, increase high fiber foods, no skipping meals, holiday eating strategies, avoiding temptations, planning for success, and keep a strict food journal. We discussed various medication options to help Nichole Smith with her weight loss efforts and we both agreed to continue Zepbound 15 mg weekly.  Return in about 6 weeks (around 12/30/2023).[name] She was informed of the importance of frequent follow up visits to maximize her success with intensive lifestyle  modifications for her multiple health conditions.  Attestation Statements:   Reviewed by clinician on day of visit: allergies, medications, problem list, medical history, surgical history, family history, social history, and previous encounter notes.   Time spent on visit including pre-visit chart review and post-visit care and charting was 49 minutes.    Roddrick Sharron, PA-C

## 2023-12-07 ENCOUNTER — Ambulatory Visit (INDEPENDENT_AMBULATORY_CARE_PROVIDER_SITE_OTHER): Payer: Medicare Other

## 2023-12-07 DIAGNOSIS — I480 Paroxysmal atrial fibrillation: Secondary | ICD-10-CM

## 2023-12-09 LAB — CUP PACEART REMOTE DEVICE CHECK
Date Time Interrogation Session: 20250105230540
Implantable Pulse Generator Implant Date: 20220620

## 2023-12-10 ENCOUNTER — Encounter: Payer: Self-pay | Admitting: Gastroenterology

## 2023-12-15 ENCOUNTER — Other Ambulatory Visit (HOSPITAL_BASED_OUTPATIENT_CLINIC_OR_DEPARTMENT_OTHER): Payer: Self-pay | Admitting: Obstetrics & Gynecology

## 2023-12-15 ENCOUNTER — Encounter (HOSPITAL_BASED_OUTPATIENT_CLINIC_OR_DEPARTMENT_OTHER): Payer: Self-pay | Admitting: Obstetrics & Gynecology

## 2023-12-15 DIAGNOSIS — N3281 Overactive bladder: Secondary | ICD-10-CM

## 2023-12-15 MED ORDER — GEMTESA 75 MG PO TABS: 1.0000 | ORAL_TABLET | Freq: Every day | ORAL | 3 refills | Status: AC | PRN

## 2023-12-23 ENCOUNTER — Encounter (HOSPITAL_BASED_OUTPATIENT_CLINIC_OR_DEPARTMENT_OTHER): Payer: Self-pay | Admitting: Obstetrics & Gynecology

## 2023-12-23 DIAGNOSIS — M810 Age-related osteoporosis without current pathological fracture: Secondary | ICD-10-CM

## 2023-12-24 ENCOUNTER — Ambulatory Visit: Payer: Self-pay | Admitting: Internal Medicine

## 2023-12-24 ENCOUNTER — Encounter (HOSPITAL_BASED_OUTPATIENT_CLINIC_OR_DEPARTMENT_OTHER): Payer: Self-pay | Admitting: *Deleted

## 2023-12-24 NOTE — Telephone Encounter (Signed)
  Chief Complaint: toe red and swollen Symptoms: left middle toe red and swollen Frequency: x 3 days Pertinent Negatives: Patient denies toe or foot injury, swelling, rash, fever, leg pain Disposition: [] ED /[] Urgent Care (no appt availability in office) / [x] Appointment(In office/virtual)/ []  [facility]/ [] Home Care/ [] Refused Recommended Disposition /[] [place] [facility]/ []  Follow-up with PCP Additional Notes: Patient states she can not take OTC NSAIDs, she has been treating with rest and ice. Patient agreeable to office visit tomorrow.   Copied from CRM [id number]. Topic: Clinical - Red Word Triage >> Dec 24, 2023  8:10 AM Gurney Maxin H wrote: [facility] that prompted transfer to Nurse Triage: Inflamed middle toe, pain, little discoloration Reason for Disposition  [1] Swollen toe AND [2] no fever  (Exceptions: Just a localized bump from bunion, corns, insect bite, sting.)  Answer Assessment - Initial Assessment Questions 1. ONSET: "When did the pain start?"      Tuesday morning.  2. LOCATION: "Where is the pain located?"   (e.g., around nail, entire toe, at foot joint)      Left foot, middle toe (entire toe).  3. PAIN: "How bad is the pain?"    (Scale 1-10; or mild, moderate, severe)   -  MILD (1-3): doesn't interfere with normal activities    -  MODERATE (4-7): interferes with normal activities (e.g., work or school) or awakens from sleep, limping    -  SEVERE (8-10): excruciating pain, unable to do any normal activities, unable to walk     6/10.  4. APPEARANCE: "What does the toe look like?" (e.g., redness, swelling, bruising, pallor)     Slightly red, slightly swollen.  5. CAUSE: "What do you think is causing the toe pain?"     Patient states she can not think of any injury, states she thought it could be gout.  6. OTHER SYMPTOMS: "Do you have any other symptoms?" (e.g., leg pain, rash, fever, numbness)     Patient unsure if there is anything involved with the bottom  or her toe or numbness.  Protocols used: Toe Pain-A-AH

## 2023-12-25 ENCOUNTER — Encounter: Payer: Self-pay | Admitting: Adult Health

## 2023-12-25 ENCOUNTER — Ambulatory Visit (INDEPENDENT_AMBULATORY_CARE_PROVIDER_SITE_OTHER): Payer: Medicare Other | Admitting: Adult Health

## 2023-12-25 VITALS — BP 100/60 | HR 73 | Temp 97.8°F | Wt 189.0 lb

## 2023-12-25 DIAGNOSIS — M109 Gout, unspecified: Secondary | ICD-10-CM | POA: Diagnosis not present

## 2023-12-25 MED ORDER — PREDNISONE 20 MG PO TABS: 20.0000 mg | ORAL_TABLET | Freq: Every day | ORAL | 0 refills | Status: DC

## 2023-12-25 NOTE — Progress Notes (Signed)
Subjective:    Patient ID: Nichole Smith, female    DOB: 26-Dec-1955, 68 y.o.   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  She is a patient of Dr. Fabian Sharp who I am seeing today for an acute issue. Her symptoms have been present for about a week. Symptoms include, redness and pain to her left middle toe. Pain has been constant. Worse with walking and bending. She denies injury or trauma. She has not used any OTC medications    Review of Systems See HPI   Past Medical History:  Diagnosis Date   Arthritis    knees, back   Bursitis    hips - almost gone per pt    Carcinoid tumor of lung 2012   right found incidentally on chest xray eval for r/o vasxculitis    Coronary artery calcification 07/23/2016   Dysrhythmia    irregular occassional, paf  no current problem   Frequent UTI    Hypertension    Hypothyroidism    Infertility, female    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  Download 08/2014:  Good compliance, breakthru apnea on 10cm.  Changed to auto 5-12cm >> good control of AHI on f/u download ONO on CPAP:       Osteoarthritis    PCOS (polycystic ovarian syndrome)    PMB (postmenopausal bleeding)    SUI (stress urinary incontinence, female)    h/o   Wears glasses     Social History   Socioeconomic History   Marital status: Married    Spouse name: Amada Jupiter   Number of children: 2   Years of education: Not on file   Highest education level:  Bachelor's degree (e.g., BA, AB, BS)  Occupational History   Occupation: retired    Associate Professor: Museum/gallery exhibitions officer  Tobacco Use   Smoking status: Never   Smokeless tobacco: Never   Tobacco comments:    Never smoked 08/07/23  Vaping Use   Vaping status: Never Used  Substance and Sexual Activity   Alcohol use: Yes    Comment: occ   Drug use: No   Sexual activity: Not Currently    Partners: Male    Birth control/protection: Post-menopausal  Other Topics Concern   Not on file  Social History Narrative   hhof 2    2 Children at college and  beyond      [place] [employer]    Married    Retired age 70 2015   Recently moved taking care of grandchildren during the week.   Social Drivers of Corporate investment banker Strain: Low Risk  (12/24/2023)   Overall Financial Resource Strain (CARDIA)    Difficulty of Paying Living Expenses: Not hard at all  Food Insecurity: No Food Insecurity (12/24/2023)   Hunger Vital Sign    Worried About Running Out of Food in the Last Year: Never true    Ran Out of Food in the Last Year: Never true  Transportation Needs: No Transportation Needs (12/24/2023)   PRAPARE - Administrator, Civil Service (Medical): No  Lack of Transportation (Non-Medical): No  Physical Activity: Sufficiently Active (12/24/2023)   Exercise Vital Sign    Days of Exercise per Week: 5 days    Minutes of Exercise per Session: 30 min  Recent Concern: Physical Activity - Insufficiently Active (10/17/2023)   Exercise Vital Sign    Days of Exercise per Week: 6 days    Minutes of Exercise per Session: 20 min  Stress: No Stress Concern Present (12/24/2023)   [employer] of Occupational Health - Occupational Stress Questionnaire    Feeling of Stress : Only a little  Social Connections: Socially Integrated (12/24/2023)   Social Connection and Isolation Panel [NHANES]    Frequency of Communication with Friends and Family: Twice a week    Frequency of Social  Gatherings with Friends and Family: Twice a week    Attends Religious Services: More than 4 times per year    Active Member of Clubs or Organizations: Yes    Attends Banker Meetings: More than 4 times per year    Marital Status: Married  Catering manager Violence: Not At Risk (11/12/2022)   Humiliation, Afraid, Rape, and Kick questionnaire    Fear of Current or Ex-Partner: No    Emotionally Abused: No    Physically Abused: No    Sexually Abused: No    Past Surgical History:  Procedure Laterality Date   ANTERIOR CERVICAL DECOMP/DISCECTOMY FUSION  12/24/2017   Procedure: Cervical five-six Cervical six-seven Anterior cervical decompression/discectomy/fusion;  Surgeon: Maeola Harman, MD;  Location: [facility] OR;  Service: Neurosurgery;;   CARPAL TUNNEL RELEASE Left 12/24/2017   Procedure: LEFT CARPAL TUNNEL RELEASE;  Surgeon: Maeola Harman, MD;  Location: [facility] OR;  Service: Neurosurgery;  Laterality: Left;   CATARACT EXTRACTION Bilateral    Right eye 08/24/2023   CESAREAN SECTION     x 1 - twins   COLONOSCOPY  2021   DILATATION & CURRETTAGE/HYSTEROSCOPY WITH RESECTOCOPE N/A 07/18/2013   Procedure: DILATATION & CURETTAGE/HYSTEROSCOPY WITH RESECTOCOPE;  Surgeon: Annamaria Boots, MD;  Location: [facility];  Service: Gynecology;  Laterality: N/A;  mass resection   HYSTEROSCOPY WITH D & C N/A 05/05/2023   Procedure: DILATATION AND CURETTAGE /HYSTEROSCOPY;  Surgeon: Jerene Bears, MD;  Location: [facility];  Service: Gynecology;  Laterality: N/A;   implantable loop recorder placement  05/20/2021   Medtronic Reveal Linq model [id number] implantable loop recorder (SN [id number])   Lung tumor removed  2012   Rt lung carcinoid   MOUTH SURGERY     pre-cancerous ulcer removed   POLYPECTOMY     SHOULDER OPEN ROTATOR CUFF REPAIR  07/20/2010   Right   WISDOM TOOTH EXTRACTION      Family History  Problem Relation Age of Onset   Stroke Mother        died age 47   Hypertension  Mother    Sudden death Mother    Alcoholism Mother    Coronary artery disease Father        died age 15   Hypertension Father    Liver disease Father    Alcohol abuse Father    Obesity Father    Parkinson's disease Brother    Diabetes Brother    Hypertension Brother    Hypertension Maternal Uncle    Colon cancer Neg Hx    Rectal cancer Neg Hx    Stomach cancer Neg Hx    Colon polyps Neg Hx    Esophageal cancer Neg Hx     Allergies  Allergen Reactions   Neomycin Other (See Comments)    Inflammation--eye drop    Ace Inhibitors Cough    Current Outpatient Medications on File Prior to Visit  Medication Sig Dispense Refill   atorvastatin (LIPITOR) 40 MG tablet Take 1 tablet (40 mg total) by mouth daily. 90 tablet 3   CALCIUM PO Take 1 tablet by mouth at bedtime.      chlorthalidone (HYGROTON) 25 MG tablet TAKE 1 TABLET DAILY 90 tablet 3   denosumab (PROLIA) 60 MG/ML SOSY injection Inject 60 mg into the skin every 6 (six) months.     diltiazem (CARDIZEM CD) 180 MG 24 hr capsule TAKE 1 CAPSULE DAILY 90 capsule 3   diltiazem (CARDIZEM) 30 MG tablet 1 tablet every 6 hours for palpitations (Patient taking differently: as needed. 1 tablet every 6 hours for palpitations) 60 tablet 3   Doxepin HCl 3 MG TABS TAKE 1 TABLET (3 MG TOTAL) BY MOUTH AT BEDTIME AS NEEDED. 30 tablet 2   estradiol (ESTRACE) 0.1 MG/GM vaginal cream INSERT 1 GM VAGINALLY TWICE WEEKLY 42.5 g 3   gabapentin (NEURONTIN) 400 MG capsule Take 1 capsule (400 mg total) by mouth 2 (two) times daily. 180 capsule 1   GEMTESA 75 MG TABS Take 1 tablet (75 mg total) by mouth daily as needed. 90 tablet 3   HYDROcodone bit-homatropine (HYCODAN) 5-1.5 MG/5ML syrup Take 5 mLs by mouth every 8 (eight) hours as needed for cough. 120 mL 0   metoprolol succinate (TOPROL-XL) 50 MG 24 hr tablet Take 1 tablet (50 mg total) by mouth daily. 90 tablet 3   Multiple Vitamins-Minerals (PRESERVISION AREDS 2 PO) Take 1 tablet by mouth 2 (two) times  daily.     phenazopyridine (PYRIDIUM) 95 MG tablet Take 1 tablet (95 mg total) by mouth 3 (three) times daily as needed. 30 tablet 1   Probiotic Product (PROBIOTIC PO) Take by mouth.     rivaroxaban (XARELTO) 20 MG TABS tablet Take 1 tablet (20 mg total) by mouth daily with supper. 90 tablet 2   SYNTHROID 125 MCG tablet Take 1 tablet (125 mcg total) by mouth daily before breakfast. Dosage change 90 tablet 3   tirzepatide (ZEPBOUND) 15 MG/0.5ML Pen Inject 15 mg into the skin once a week. 6 mL 0   No current facility-administered medications on file prior to visit.    BP 100/60   Pulse 73   Temp 97.8 F (36.6 C) (Oral)   Wt 189 lb (85.7 kg)   LMP 02/12/2011   SpO2 95%   BMI 38.17 kg/m       Objective:   Physical Exam Vitals and nursing note reviewed.  Constitutional:      Appearance: Normal appearance.  Musculoskeletal:        General: Normal range of motion.       Feet:  Skin:    General: Skin is warm and dry.  Neurological:     General: No focal deficit present.     Mental Status: She is alert and oriented to person, place, and time.  Psychiatric:        Mood and Affect: Mood normal.        Behavior: Behavior normal.        Thought Content: Thought content normal.        Judgment: Judgment normal.       Assessment & Plan:  1. Acute gout involving toe of left foot, unspecified cause (Primary) - symptoms consistent with Gout. Will send  in Prednisone. Advised follow up if not resolving in the next 3-4 days  - predniSONE (DELTASONE) 20 MG tablet; Take 1 tablet (20 mg total) by mouth daily with breakfast.  Dispense: 7 tablet; Refill: 0  Shirline Frees, NP

## 2023-12-25 NOTE — Patient Instructions (Signed)
I think you have gout.   I have sent in prednisone to help get you over your flare.   Let me know if you are not improving over the next week

## 2023-12-30 ENCOUNTER — Encounter (INDEPENDENT_AMBULATORY_CARE_PROVIDER_SITE_OTHER): Payer: Self-pay | Admitting: Physician Assistant

## 2023-12-30 ENCOUNTER — Ambulatory Visit (INDEPENDENT_AMBULATORY_CARE_PROVIDER_SITE_OTHER): Payer: Medicare Other | Admitting: Physician Assistant

## 2023-12-30 VITALS — BP 125/73 | HR 80 | Temp 98.4°F | Ht 59.0 in | Wt 185.0 lb

## 2023-12-30 DIAGNOSIS — E7841 Elevated Lipoprotein(a): Secondary | ICD-10-CM

## 2023-12-30 DIAGNOSIS — I2510 Atherosclerotic heart disease of native coronary artery without angina pectoris: Secondary | ICD-10-CM

## 2023-12-30 DIAGNOSIS — R7303 Prediabetes: Secondary | ICD-10-CM

## 2023-12-30 DIAGNOSIS — E559 Vitamin D deficiency, unspecified: Secondary | ICD-10-CM

## 2023-12-30 DIAGNOSIS — I10 Essential (primary) hypertension: Secondary | ICD-10-CM

## 2023-12-30 DIAGNOSIS — Z6837 Body mass index (BMI) 37.0-37.9, adult: Secondary | ICD-10-CM

## 2023-12-30 DIAGNOSIS — E669 Obesity, unspecified: Secondary | ICD-10-CM

## 2023-12-30 MED ORDER — ZEPBOUND 15 MG/0.5ML ~~LOC~~ SOAJ
15.0000 mg | SUBCUTANEOUS | 0 refills | Status: DC
Start: 2023-12-30 — End: 2024-01-15

## 2023-12-30 NOTE — Progress Notes (Signed)
SUBJECTIVE:  Chief Complaint: Obesity  Interim History: She is down 1 lb since her last visit.  Down 53 lbs overall TBW loss of 22.3% Nichole Smith presents for follow-up of her obesity treatment plan.  She has been actively managing her obesity, noting a slight weight loss over the past few weeks despite the holiday season, and an increase in muscle mass. She has been back on track with her routine for the past four weeks, and her visceral adipose rating has decreased from 19 to 15, with a goal of reaching 12.  She experiences chronic constipation as a side effect of Zepbound, which she manages with a high-fiber diet and Miralax four to five times a week. No other gastrointestinal issues, changes in vision, or mood alterations.  She is currently taking vitamin D3 at a dose of 125 mcg twice daily, which has maintained her vitamin D levels within the desired range.  She has a history of chronic erythrocytosis, previously attributed to undiagnosed sleep apnea, for which she uses a CPAP machine. Despite using CPAP consistently for the past few year, her hemoglobin and hematocrit levels have not changed significantly. She is on Xarelto for paroxysmal atrial fibrillation, which she has been taking for a few months.  She mentions a recent episode of a swollen toe/gout, for which she is currently on prednisone, providing her with pain relief and increased energy. She expresses concern about the limitations imposed by Xarelto, particularly in managing chronic pain.  She is actively involved in planning her daughter's wedding planning and frequently cares for her grandchildren, aged 31 and ten.  Elaiza is here to discuss her progress with her obesity treatment plan. She is on the keeping a food journal and adhering to recommended goals of 1200-1300 calories and 90 grams of  protein and states she is following her eating plan approximately 60 % of the time. She states she is exercising  walking/swimming/strength training  2 x 15 minutes 7 times per week.   OBJECTIVE: Visit Diagnoses: Problem List Items Addressed This Visit     Essential hypertension   Coronary artery calcification - Primary   Relevant Medications   tirzepatide (ZEPBOUND) 15 MG/0.5ML Pen   Vitamin D deficiency   Prediabetes   Relevant Medications   tirzepatide (ZEPBOUND) 15 MG/0.5ML Pen   Obesity, Starting BMI 46.48   Relevant Medications   tirzepatide (ZEPBOUND) 15 MG/0.5ML Pen   Other Visit Diagnoses       Elevated lipoprotein(a)          Obesity [name] has shown progress with a adipose loss of 2.8 pounds and an increase in muscle mass by 1.4 lbs. Her visceral adipose rating has decreased from 19 to 15, with a goal of 12 or less. She adheres to a calorie-controlled diet and exercise regimen, which has been effective. No significant side effects from current medications, except for well-managed chronic constipation from Zepbound. - Continue current diet and exercise regimen - Monitor weight and muscle mass at next visit - Encourage continued adherence to calorie goals - Continue Zepbound, manage constipation with fiber and Miralax  Coronary Artery Calcification Reduction in visceral adipose tissue is beneficial in reducing the risk of coronary artery disease. Discussed the importance of maintaining a heart-healthy diet and exercise regimen. Continue/refill Zepbound 15 mg weekly for CV risk reduction.  - Continue monitoring cardiovascular health Continue to work on nutrition plan -decreasing simple carbohydrates, increasing lean proteins, decreasing saturated fats and cholesterol , avoiding trans fats and exercise as  able to promote weight loss, improve lipids and decrease cardiovascular risks.  Hyperlipidemia/Elevated lipoprotein a Current management includes lifestyle modifications and medication. Discussed the importance of continued adherence to lipid-lowering therapy. - Continue  current lipid-lowering therapy- Zepbound and Lipitor 40 mg daily.  Continue to work on nutrition plan -decreasing simple carbohydrates, increasing lean proteins, decreasing saturated fats and cholesterol , avoiding trans fats and exercise as able to promote weight loss, improve lipids and decrease cardiovascular risks.  Prediabetes At risk for progression to type 2 diabetes. The reduction in visceral adipose tissue is a positive indicator of reduced risk. Discussed the importance of continued lifestyle modifications. - Continue monitoring blood glucose levels - Encourage lifestyle modifications to prevent progression to type 2 diabetes  Hypertension Blood pressure is currently well-controlled. Discussed the importance of continued adherence to antihypertensive therapy. - Continue current antihypertensive therapy - Monitor blood pressure at next visit  Vitamin D Deficiency Taking 125 mcg (5000 IU) of vitamin D3 twice daily. Recent levels are within the normal range. Discussed the importance of continued supplementation. - Continue current vitamin D supplementation - Monitor vitamin D levels periodically  Chronic Erythrocytosis Previously attributed to undiagnosed sleep apnea. Currently on CPAP therapy and anticoagulated with Xarelto. Hemoglobin and hematocrit levels remain elevated. Discussed the potential need for follow-up with hematology and the importance of anticoagulation. - Continue CPAP therapy - Continue Xarelto - Consider follow-up with hematology.  General Health Maintenance Generally in good health and adheres to a healthy lifestyle. Discussed various protein shake options and dietary modifications. Provided recipes for homemade protein shakes using Fairlife milk or [place] yogurt. - Encourage continued healthy eating habits - Consider homemade protein shakes using Fairlife milk or Greek yogurt - Monitor overall health and wellness  Follow-up - Follow-up appointment on March  12 at 10 AM.      Vitals Temp: 98.4 F (36.9 C) BP: 125/73 Pulse Rate: 80 SpO2: 94 %   Anthropometric Measurements Height: 4\' 11"  (1.499 m) Weight: 185 lb (83.9 kg) BMI (Calculated): 37.35 Weight at Last Visit: 186 lb Weight Lost Since Last Visit: 1 lb Weight Gained Since Last Visit: 0 Starting Weight: 238 lb Total Weight Loss (lbs): 53 lb (24 kg) Peak Weight: 238 lb   Body Composition  Body Fat %: 47.6 % Fat Mass (lbs): 88.4 lbs Muscle Mass (lbs): 92.2 lbs Total Body Water (lbs): 69 lbs Visceral Fat Rating : 15   Other Clinical Data Fasting: no Labs: no Today's Visit #: 65 Starting Date: 01/12/19     ASSESSMENT AND PLAN:  Diet: Terrin is currently in the action stage of change. As such, her goal is to continue with weight loss efforts. She has agreed to keeping a food journal and adhering to recommended goals of 1200-1300 calories and 90 grams of protein.  Exercise: Rishita has been instructed to work up to a goal of 150 minutes of combined cardio and strengthening exercise per week for weight loss and overall health benefits.   Behavior Modification:  We discussed the following Behavioral Modification Strategies today: increasing lean protein intake, decreasing simple carbohydrates, increasing vegetables, increase H2O intake, increase high fiber foods, no skipping meals, avoiding temptations, planning for success, and keep a strict food journal. We discussed various medication options to help Sherre with her weight loss efforts and we both agreed to continue Zepbound 15 mg weekly for primary indication of CV risk reduction and continue to work on nutritional and behavioral strategies to promote weight loss.  .  Return in about  4 weeks (around 01/27/2024).[name] She was informed of the importance of frequent follow up visits to maximize her success with intensive lifestyle modifications for her multiple health conditions.  Attestation Statements:   Reviewed by  clinician on day of visit: allergies, medications, problem list, medical history, surgical history, family history, social history, and previous encounter notes.   Time spent on visit including pre-visit chart review and post-visit care and charting was 47 minutes.    Roby Donaway, PA-C

## 2024-01-05 ENCOUNTER — Ambulatory Visit (HOSPITAL_BASED_OUTPATIENT_CLINIC_OR_DEPARTMENT_OTHER)
Admission: RE | Admit: 2024-01-05 | Discharge: 2024-01-05 | Disposition: A | Discharge status: Home / Self Care | Payer: Medicare Other | Source: Ambulatory Visit | Attending: Obstetrics & Gynecology | Admitting: Obstetrics & Gynecology

## 2024-01-05 DIAGNOSIS — M810 Age-related osteoporosis without current pathological fracture: Secondary | ICD-10-CM | POA: Diagnosis present

## 2024-01-11 ENCOUNTER — Encounter (HOSPITAL_BASED_OUTPATIENT_CLINIC_OR_DEPARTMENT_OTHER): Payer: Self-pay | Admitting: Obstetrics & Gynecology

## 2024-01-11 ENCOUNTER — Ambulatory Visit (HOSPITAL_BASED_OUTPATIENT_CLINIC_OR_DEPARTMENT_OTHER): Payer: Medicare Other | Admitting: Obstetrics & Gynecology

## 2024-01-11 ENCOUNTER — Ambulatory Visit (INDEPENDENT_AMBULATORY_CARE_PROVIDER_SITE_OTHER): Payer: Medicare Other

## 2024-01-11 VITALS — BP 116/66 | HR 72 | Ht 59.0 in | Wt 189.4 lb

## 2024-01-11 DIAGNOSIS — M85852 Other specified disorders of bone density and structure, left thigh: Secondary | ICD-10-CM | POA: Diagnosis not present

## 2024-01-11 DIAGNOSIS — I480 Paroxysmal atrial fibrillation: Secondary | ICD-10-CM | POA: Diagnosis not present

## 2024-01-11 DIAGNOSIS — M85851 Other specified disorders of bone density and structure, right thigh: Secondary | ICD-10-CM | POA: Diagnosis not present

## 2024-01-11 DIAGNOSIS — M810 Age-related osteoporosis without current pathological fracture: Secondary | ICD-10-CM

## 2024-01-11 LAB — CUP PACEART REMOTE DEVICE CHECK
Date Time Interrogation Session: 20250209230918
Implantable Pulse Generator Implant Date: 20220620

## 2024-01-11 MED ORDER — RISEDRONATE SODIUM 150 MG PO TABS: 150.0000 mg | ORAL_TABLET | ORAL | 3 refills | Status: DC

## 2024-01-12 NOTE — Progress Notes (Unsigned)
[facility] [address] [place] [address] Phone: [phone] Subjective:   Nichole Smith, am serving as a scribe for Dr. Antoine Primas.  I'm seeing this patient by the request  of:  Panosh, Neta Mends, MD  CC: Low back pain  [id number]  10/21/2023 Patient is doing relatively well on the gabapentin but continues to forget the midday dose.  Patient continues to have radicular symptoms and weakness.  I do feel that it would be a good idea for patient to consider Horizant.  Will send a new prescription and see how patient tolerates it.  On 300 mg otherwise and can go up to 400 mg twice daily if we keep forgetting the midday dosing.  Follow-up with me again in 3 months     Update 01/13/2024 Nichole Smith is a 68 y.o. female coming in with complaint of lumbar radiculopathy. Patient states that she is ready for an injection as her back pain is getting more painful in evenings. Last epidural 06/18/2023.  Wants to discuss arnica and tart cherry.    Recent imaging includes a bone density that shows patient is osteopenic.  This is an improvement from the -2.9 previously while patient was on Prolia.  Patient is on Zepbound to help with weight loss.  Other medications we have done includes the gabapentin 400 mg twice a day.  Last epidural for the back was done in July 2024.  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  Download 08/2014:  Good compliance, breakthru apnea on 10cm.   Changed to auto 5-12cm >> good control of AHI on f/u download ONO on CPAP:       Osteoarthritis    PCOS (polycystic ovarian syndrome)    PMB (postmenopausal bleeding)    SUI (stress urinary incontinence, female)    h/o   Wears glasses    Past Surgical History:  Procedure Laterality Date   ANTERIOR CERVICAL DECOMP/DISCECTOMY FUSION  12/24/2017   Procedure: Cervical five-six Cervical six-seven Anterior cervical decompression/discectomy/fusion;  Surgeon: Maeola Harman, MD;  Location: [facility] OR;  Service: Neurosurgery;;   CARPAL TUNNEL RELEASE Left 12/24/2017   Procedure: LEFT CARPAL TUNNEL RELEASE;  Surgeon: Maeola Harman, MD;  Location: [facility] OR;  Service: Neurosurgery;  Laterality: Left;   CATARACT EXTRACTION Bilateral    Right eye 08/24/2023   CESAREAN SECTION     x 1 - twins   COLONOSCOPY  2021   DILATATION & CURRETTAGE/HYSTEROSCOPY WITH RESECTOCOPE N/A 07/18/2013   Procedure: DILATATION & CURETTAGE/HYSTEROSCOPY WITH RESECTOCOPE;  Surgeon: Annamaria Boots, MD;  Location: [facility] ORS;  Service: Gynecology;  Laterality: N/A;  mass resection   HYSTEROSCOPY WITH D & C N/A 05/05/2023   Procedure: DILATATION AND CURETTAGE /HYSTEROSCOPY;  Surgeon: Jerene Bears, MD;  [facility] ;  Service: Gynecology;  Laterality: N/A;   implantable loop recorder placement  05/20/2021   Medtronic Reveal Linq model  [id number] implantable loop recorder (SN [id number])   Lung tumor removed  2012   Rt lung carcinoid   MOUTH SURGERY     pre-cancerous ulcer removed   POLYPECTOMY     SHOULDER OPEN ROTATOR CUFF REPAIR  07/20/2010   Right   WISDOM TOOTH EXTRACTION     Social History   Socioeconomic History   Marital status: Married    Spouse name: Amada Jupiter   Number of children: 2   Years of education: Not on file   Highest education level: Bachelor's degree (e.g., BA, AB, BS)  Occupational History   Occupation: retired    Associate Professor: Museum/gallery exhibitions officer  Tobacco Use   Smoking status:  Never   Smokeless tobacco: Never   Tobacco comments:    Never smoked 08/07/23  Vaping Use   Vaping status: Never Used  Substance and Sexual Activity   Alcohol use: Yes    Comment: occ   Drug use: No   Sexual activity: Not Currently    Partners: Male    Birth control/protection: Post-menopausal  Other Topics Concern   Not on file  Social History Narrative   hhof 2    2 Children at college and  beyond      [employer]    Married    Retired age 48 2015   Recently moved taking care of grandchildren during the week.   Social Drivers of Corporate investment banker Strain: Low Risk  (12/24/2023)   Overall Financial Resource Strain (CARDIA)    Difficulty of Paying Living Expenses: Not hard at all  Food Insecurity: No Food Insecurity (12/24/2023)   Hunger Vital Sign    Worried About Running Out of Food in the Last Year: Never true    Ran Out of Food in the Last Year: Never true  Transportation Needs: No Transportation Needs (12/24/2023)   PRAPARE - Administrator, Civil Service (Medical): No    Lack of Transportation (Non-Medical): No  Physical Activity: Sufficiently Active (12/24/2023)   Exercise Vital Sign    Days of Exercise per Week: 5 days    Minutes of Exercise per Session: 30 min  Recent Concern: Physical Activity - Insufficiently Active (10/17/2023)   Exercise Vital Sign    Days of Exercise per Week: 6 days    Minutes of Exercise per Session: 20 min  Stress: No Stress Concern Present (12/24/2023)   [employer] of Occupational Health - Occupational Stress Questionnaire    Feeling of Stress : Only a little  Social Connections: Socially Integrated (12/24/2023)   Social Connection and Isolation Panel [NHANES]    Frequency of Communication with Friends and Family: Twice a week    Frequency of Social Gatherings with Friends and Family: Twice a week    Attends Religious Services: More than 4 times per year    Active Member of [employer] or Organizations: Yes     Attends Engineer, structural: More than 4 times per year    Marital Status: Married   Allergies  Allergen Reactions   Neomycin Other (See Comments)    Inflammation--eye drop    Ace Inhibitors Cough   Family History  Problem Relation Age of Onset   Stroke Mother        died age 13   Hypertension Mother    Sudden death Mother    Alcoholism Mother    Coronary artery disease Father        died age 38   Hypertension Father  Liver disease Father    Alcohol abuse Father    Obesity Father    Parkinson's disease Brother    Diabetes Brother    Hypertension Brother    Hypertension Maternal Uncle    Colon cancer Neg Hx    Rectal cancer Neg Hx    Stomach cancer Neg Hx    Colon polyps Neg Hx    Esophageal cancer Neg Hx     Current Outpatient Medications (Endocrine & Metabolic):    denosumab (PROLIA) 60 MG/ML SOSY injection, Inject 60 mg into the skin every 6 (six) months.   predniSONE (DELTASONE) 20 MG tablet, Take 1 tablet (20 mg total) by mouth daily with breakfast.   risedronate (ACTONEL) 150 MG tablet, Take 1 tablet (150 mg total) by mouth every 30 (thirty) days. with water on empty stomach, nothing by mouth or lie down for next 30 minutes.   SYNTHROID 125 MCG tablet, Take 1 tablet (125 mcg total) by mouth daily before breakfast. Dosage change  Current Outpatient Medications (Cardiovascular):    atorvastatin (LIPITOR) 40 MG tablet, Take 1 tablet (40 mg total) by mouth daily.   chlorthalidone (HYGROTON) 25 MG tablet, TAKE 1 TABLET DAILY   diltiazem (CARDIZEM CD) 180 MG 24 hr capsule, TAKE 1 CAPSULE DAILY   diltiazem (CARDIZEM) 30 MG tablet, 1 tablet every 6 hours for palpitations (Patient taking differently: as needed. 1 tablet every 6 hours for palpitations)   metoprolol succinate (TOPROL-XL) 50 MG 24 hr tablet, Take 1 tablet (50 mg total) by mouth daily.  Current Outpatient Medications (Respiratory):    HYDROcodone bit-homatropine (HYCODAN) 5-1.5 MG/5ML syrup, Take 5  mLs by mouth every 8 (eight) hours as needed for cough.   Current Outpatient Medications (Hematological):    rivaroxaban (XARELTO) 20 MG TABS tablet, Take 1 tablet (20 mg total) by mouth daily with supper.  Current Outpatient Medications (Other):    CALCIUM PO, Take 1 tablet by mouth at bedtime.    Doxepin HCl 3 MG TABS, TAKE 1 TABLET (3 MG TOTAL) BY MOUTH AT BEDTIME AS NEEDED.   estradiol (ESTRACE) 0.1 MG/GM vaginal cream, INSERT 1 GM VAGINALLY TWICE WEEKLY   gabapentin (NEURONTIN) 400 MG capsule, Take 1 capsule (400 mg total) by mouth 2 (two) times daily.   GEMTESA 75 MG TABS, Take 1 tablet (75 mg total) by mouth daily as needed.   Multiple Vitamins-Minerals (PRESERVISION AREDS 2 PO), Take 1 tablet by mouth 2 (two) times daily.   phenazopyridine (PYRIDIUM) 95 MG tablet, Take 1 tablet (95 mg total) by mouth 3 (three) times daily as needed.   Probiotic Product (PROBIOTIC PO), Take by mouth.   tirzepatide (ZEPBOUND) 15 MG/0.5ML Pen, Inject 15 mg into the skin once a week.   Reviewed prior external information including notes and imaging from  primary care provider As well as notes that were available from care everywhere and other healthcare systems.  Past medical history, social, surgical and family history all reviewed in electronic medical record.  No pertanent information unless stated regarding to the chief complaint.   Review of Systems:  No headache, visual changes, nausea, vomiting, diarrhea, constipation, dizziness, abdominal pain, skin rash, fevers, chills, night sweats, weight loss, swollen lymph nodes,  joint swelling, chest pain, shortness of breath, mood changes. POSITIVE muscle aches, body aches  Objective  Blood pressure 118/78, pulse 68, height 4\' 11"  (1.499 m), weight 190 lb (86.2 kg), last menstrual period 02/12/2011, SpO2 95%.   General: No apparent distress alert and oriented x3 mood  and affect normal, dressed appropriately.  HEENT: Pupils equal, extraocular  movements intact  Respiratory: Patient's speak in full sentences and does not appear short of breath  Cardiovascular: No lower extremity edema, non tender, no erythema  Back exam shows does have some loss lordosis.  Patient is able to get up from a seated position noted.  Still has significant tightness noted with limited extension of the back noted.    Impression and Recommendations:      The above documentation has been reviewed and is accurate and complete Judi Saa, DO

## 2024-01-13 ENCOUNTER — Encounter: Payer: Self-pay | Admitting: Family Medicine

## 2024-01-13 ENCOUNTER — Ambulatory Visit: Payer: Medicare Other | Admitting: Family Medicine

## 2024-01-13 ENCOUNTER — Other Ambulatory Visit (HOSPITAL_BASED_OUTPATIENT_CLINIC_OR_DEPARTMENT_OTHER): Payer: Self-pay | Admitting: Obstetrics & Gynecology

## 2024-01-13 ENCOUNTER — Encounter (HOSPITAL_BASED_OUTPATIENT_CLINIC_OR_DEPARTMENT_OTHER): Payer: Self-pay | Admitting: Obstetrics & Gynecology

## 2024-01-13 VITALS — BP 118/78 | HR 68 | Ht 59.0 in | Wt 190.0 lb

## 2024-01-13 DIAGNOSIS — M5416 Radiculopathy, lumbar region: Secondary | ICD-10-CM

## 2024-01-13 DIAGNOSIS — M810 Age-related osteoporosis without current pathological fracture: Secondary | ICD-10-CM

## 2024-01-13 MED ORDER — ALENDRONATE SODIUM 70 MG PO TABS
70.0000 mg | ORAL_TABLET | ORAL | 3 refills | Status: DC
Start: 2024-01-13 — End: 2024-08-29

## 2024-01-13 NOTE — Assessment & Plan Note (Addendum)
Severe spinal stenosis that has been doing better since patient has lost 50 pounds.  Last epidural was greater than 7 months ago.  Hopeful that patient will continue to do well.  Will get a another epidural at this time to see if we can do to keep patient active.  Patient's BMI is under 40 which is wonderful.  Follow-up with me again 3 months

## 2024-01-13 NOTE — Patient Instructions (Addendum)
Epidural [phone] Be proud of all you have accomplished See me again in 3 months

## 2024-01-14 ENCOUNTER — Telehealth: Payer: Self-pay

## 2024-01-14 NOTE — Progress Notes (Signed)
GYNECOLOGY  VISIT  CC:   discuss treatment  HPI: 68 y.o. [id number] Married White or Caucasian female here for discussion of BMD and medication changes.  BMD done 01/05/2024 with T score -2.1 in right femoral neck.  Prior BMD 08/29/2021 with T score -2.9 in femoral neck.  She has been on Prolia and done really well.  Now BMD is showing osteopenia and not osteoporosis, recommend switching to bisphosphonate.  We discussed options.  Feel Actonel is good option for her given thyroid medication and monthly dosing would be easiest.  Unsure of coverage.  She is ok with fosamax if will be costly or if needs to fail other methods.  Will sent to pharmacy.  Dosing, administration, side effects discussed.     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  Download 08/2014:  Good compliance, breakthru apnea on 10cm.  Changed to auto 5-12cm >> good control of AHI on f/u download ONO on CPAP:       Osteoarthritis    PCOS (polycystic ovarian syndrome)    PMB (postmenopausal bleeding)    SUI (stress urinary incontinence, female)    h/o   Wears glasses     MEDS:   Current Outpatient Medications on File Prior to Visit  Medication Sig Dispense Refill   atorvastatin (LIPITOR) 40 MG tablet Take 1 tablet (40 mg total) by mouth daily. 90 tablet 3   CALCIUM PO Take 1 tablet by mouth at bedtime.      chlorthalidone (HYGROTON) 25 MG tablet TAKE 1 TABLET DAILY 90 tablet 3   diltiazem (CARDIZEM CD) 180 MG 24 hr capsule TAKE 1 CAPSULE DAILY 90 capsule 3    diltiazem (CARDIZEM) 30 MG tablet 1 tablet every 6 hours for palpitations (Patient taking differently: as needed. 1 tablet every 6 hours for palpitations) 60 tablet 3   Doxepin HCl 3 MG TABS TAKE 1 TABLET (3 MG TOTAL) BY MOUTH AT BEDTIME AS NEEDED. 30 tablet 2   estradiol (ESTRACE) 0.1 MG/GM vaginal cream INSERT 1 GM VAGINALLY TWICE WEEKLY 42.5 g 3   gabapentin (NEURONTIN) 400 MG capsule Take 1 capsule (400 mg total) by mouth 2 (two) times daily. 180 capsule 1   GEMTESA 75 MG TABS Take 1 tablet (75 mg total) by mouth daily as needed. 90 tablet 3   HYDROcodone bit-homatropine (HYCODAN) 5-1.5 MG/5ML syrup Take 5 mLs by mouth every 8 (eight) hours as needed for cough. 120 mL 0   metoprolol succinate (TOPROL-XL) 50 MG 24 hr tablet Take 1 tablet (50 mg total) by mouth daily. 90 tablet 3   Multiple Vitamins-Minerals (PRESERVISION AREDS 2 PO) Take 1 tablet by mouth 2 (two) times daily.     phenazopyridine (PYRIDIUM) 95 MG tablet Take 1 tablet (95 mg total) by mouth 3 (three) times daily as needed. 30 tablet 1   predniSONE (DELTASONE) 20 MG tablet Take 1 tablet (20 mg total) by mouth daily with breakfast.  7 tablet 0   Probiotic Product (PROBIOTIC PO) Take by mouth.     rivaroxaban (XARELTO) 20 MG TABS tablet Take 1 tablet (20 mg total) by mouth daily with supper. 90 tablet 2   SYNTHROID 125 MCG tablet Take 1 tablet (125 mcg total) by mouth daily before breakfast. Dosage change 90 tablet 3   tirzepatide (ZEPBOUND) 15 MG/0.5ML Pen Inject 15 mg into the skin once a week. 6 mL 0   No current facility-administered medications on file prior to visit.    ALLERGIES: Neomycin and Ace inhibitors  SH:  married, non smoker  Review of Systems  Constitutional: Negative.     PHYSICAL EXAMINATION:    BP 116/66 (BP Location: Right Arm, Patient Position: Sitting, Cuff Size: Large)   Pulse 72   Ht 4\' 11"  (1.499 m)   Wt 189 lb 6.4 oz (85.9 kg)   LMP 02/12/2011   BMI 38.25 kg/m     Physical  Exam Constitutional:      Appearance: Normal appearance.  Neurological:     General: No focal deficit present.     Mental Status: She is alert.  Psychiatric:        Mood and Affect: Mood normal.    Assessment/Plan: 1. Age-related osteoporosis without current pathological fracture (Primary) - rx for risedronate (Actonel) 150mg  once monthly.  She understands to take on empty stomach with water and be upright for 30 minutes prior to eating or drinking anything else.  Questions answered.

## 2024-01-14 NOTE — Telephone Encounter (Signed)
..     Pre-operative Risk Assessment    Patient Name: Nichole Smith  DOB: [birth year] MRN: [id number]   Date of last office visit: 10/21/23 Date of next office visit: none   Request for Surgical Clearance    Procedure:   lumbar epi  Date of Surgery:  Clearance TBD                                Surgeon:  Jaynie Bream Group or Practice Name:  [facility] Phone number:  [phone] Fax number:  [phone]   Type of Clearance Requested:   - Medical  - Pharmacy:  Hold Rivaroxaban (Xarelto) STOP TWO DOSES   Type of Anesthesia:  Not Indicated   Additional requests/questions:    Jola Babinski   01/14/2024, 8:56 AM

## 2024-01-15 ENCOUNTER — Telehealth: Payer: Self-pay

## 2024-01-15 NOTE — Telephone Encounter (Signed)
   Name: Nichole Smith  DOB: 1956-01-14  MRN: [id number]  Primary Cardiologist: Armanda Magic, MD   Preoperative team, please contact this patient and set up a phone call appointment for further preoperative risk assessment. Please obtain consent and complete medication review. Thank you for your help.  I confirm that guidance regarding antiplatelet and oral anticoagulation therapy has been completed and, if necessary, noted below.  Per office protocol, patient can hold Xarelto for 3 days prior to procedure.    I also confirmed the patient resides in the state of [place]. As per [facility] Medical Board telemedicine laws, the patient must reside in the state in which the provider is licensed.   Napoleon Form, Leodis Rains, NP 01/15/2024, 10:25 AM [facility]

## 2024-01-15 NOTE — Telephone Encounter (Signed)
Called patient to make a Tele visit for pre-op on 01/2724. Meds rec and consent done.  .  Patient Consent for Virtual Visit        [name] has provided verbal consent on 01/15/2024 for a virtual visit (video or telephone).   CONSENT FOR VIRTUAL VISIT FOR:  Nichole Smith  By participating in this virtual visit I agree to the following:  I hereby voluntarily request, consent and authorize [facility] and its employed or contracted physicians, physician assistants, nurse practitioners or other licensed health care professionals (the Practitioner), to provide me with telemedicine health care services (the "Services") as deemed necessary by the treating Practitioner. I acknowledge and consent to receive the Services by the Practitioner via telemedicine. I understand that the telemedicine visit will involve communicating with the Practitioner through live audiovisual communication technology and the disclosure of certain medical information by electronic transmission. I acknowledge that I have been given the opportunity to request an in-person assessment or other available alternative prior to the telemedicine visit and am voluntarily participating in the telemedicine visit.  I understand that I have the right to withhold or withdraw my consent to the use of telemedicine in the course of my care at any time, without affecting my right to future care or treatment, and that the Practitioner or I may terminate the telemedicine visit at any time. I understand that I have the right to inspect all information obtained and/or recorded in the course of the telemedicine visit and may receive copies of available information for a reasonable fee.  I understand that some of the potential risks of receiving the Services via telemedicine include:  Delay or interruption in medical evaluation due to technological equipment failure or disruption; Information transmitted may not be sufficient (e.g.  poor resolution of images) to allow for appropriate medical decision making by the Practitioner; and/or  In rare instances, security protocols could fail, causing a breach of personal health information.  Furthermore, I acknowledge that it is my responsibility to provide information about my medical history, conditions and care that is complete and accurate to the best of my ability. I acknowledge that Practitioner's advice, recommendations, and/or decision may be based on factors not within their control, such as incomplete or inaccurate data provided by me or distortions of diagnostic images or specimens that may result from electronic transmissions. I understand that the practice of medicine is not an exact science and that Practitioner makes no warranties or guarantees regarding treatment outcomes. I acknowledge that a copy of this consent can be made available to me via my patient portal [facility] MyChart), or I can request a printed copy by calling the office of [facility].    I understand that my insurance will be billed for this visit.   I have read or had this consent read to me. I understand the contents of this consent, which adequately explains the benefits and risks of the Services being provided via telemedicine.  I have been provided ample opportunity to ask questions regarding this consent and the Services and have had my questions answered to my satisfaction. I give my informed consent for the services to be provided through the use of telemedicine in my medical care

## 2024-01-15 NOTE — Telephone Encounter (Addendum)
Patient with diagnosis of PAF on Xarelto for anticoagulation.    Procedure: lumbar epi  Date of procedure: TBD   CHA2DS2-VASc Score = 4   This indicates a 4.8% annual risk of stroke. The patient's score is based upon: CHF History: 0 HTN History: 1 Diabetes History: 0 Stroke History: 0 Vascular Disease History: 1 (CAD on CT) Age Score: 1 Gender Score: 1     CrCl 84 ml/min  Platelet count 254 K   Per office protocol, patient can hold Xarelto for 3 days prior to procedure.     **This guidance is not considered finalized until pre-operative APP has relayed final recommendations.**

## 2024-01-15 NOTE — Telephone Encounter (Signed)
Called patient to make a Tele visit for pre-op on 01/2724. Meds rec and consent done.  .  Patient Consent for Virtual Visit        Nichole Smith has provided verbal consent on 01/15/2024 for a virtual visit (video or telephone).   CONSENT FOR VIRTUAL VISIT FOR:  Nichole Smith  By participating in this virtual visit I agree to the following:  I hereby voluntarily request, consent and authorize [facility] and its employed or contracted physicians, physician assistants, nurse practitioners or other licensed health care professionals (the Practitioner), to provide me with telemedicine health care services (the "Services") as deemed necessary by the treating Practitioner. I acknowledge and consent to receive the Services by the Practitioner via telemedicine. I understand that the telemedicine visit will involve communicating with the Practitioner through live audiovisual communication technology and the disclosure of certain medical information by electronic transmission. I acknowledge that I have been given the opportunity to request an in-person assessment or other available alternative prior to the telemedicine visit and am voluntarily participating in the telemedicine visit.  I understand that I have the right to withhold or withdraw my consent to the use of telemedicine in the course of my care at any time, without affecting my right to future care or treatment, and that the Practitioner or I may terminate the telemedicine visit at any time. I understand that I have the right to inspect all information obtained and/or recorded in the course of the telemedicine visit and may receive copies of available information for a reasonable fee.  I understand that some of the potential risks of receiving the Services via telemedicine include:  Delay or interruption in medical evaluation due to technological equipment failure or disruption; Information transmitted may not be sufficient (e.g.  poor resolution of images) to allow for appropriate medical decision making by the Practitioner; and/or  In rare instances, security protocols could fail, causing a breach of personal health information.  Furthermore, I acknowledge that it is my responsibility to provide information about my medical history, conditions and care that is complete and accurate to the best of my ability. I acknowledge that Practitioner's advice, recommendations, and/or decision may be based on factors not within their control, such as incomplete or inaccurate data provided by me or distortions of diagnostic images or specimens that may result from electronic transmissions. I understand that the practice of medicine is not an exact science and that Practitioner makes no warranties or guarantees regarding treatment outcomes. I acknowledge that a copy of this consent can be made available to me via my patient portal [facility] MyChart), or I can request a printed copy by calling the office of [facility].    I understand that my insurance will be billed for this visit.   I have read or had this consent read to me. I understand the contents of this consent, which adequately explains the benefits and risks of the Services being provided via telemedicine.  I have been provided ample opportunity to ask questions regarding this consent and the Services and have had my questions answered to my satisfaction. I give my informed consent for the services to be provided through the use of telemedicine in my medical care

## 2024-01-18 ENCOUNTER — Ambulatory Visit: Payer: Medicare Other | Admitting: Obstetrics

## 2024-01-18 ENCOUNTER — Ambulatory Visit (INDEPENDENT_AMBULATORY_CARE_PROVIDER_SITE_OTHER): Payer: Medicare Other | Admitting: Obstetrics

## 2024-01-18 ENCOUNTER — Encounter: Payer: Self-pay | Admitting: Obstetrics

## 2024-01-18 ENCOUNTER — Other Ambulatory Visit: Payer: Self-pay | Admitting: Cardiology

## 2024-01-18 VITALS — BP 121/70 | HR 71

## 2024-01-18 DIAGNOSIS — N3946 Mixed incontinence: Secondary | ICD-10-CM | POA: Diagnosis not present

## 2024-01-18 DIAGNOSIS — Z87440 Personal history of urinary (tract) infections: Secondary | ICD-10-CM

## 2024-01-18 DIAGNOSIS — N8110 Cystocele, unspecified: Secondary | ICD-10-CM | POA: Diagnosis not present

## 2024-01-18 DIAGNOSIS — N390 Urinary tract infection, site not specified: Secondary | ICD-10-CM

## 2024-01-18 NOTE — Progress Notes (Signed)
[facility] Return Visit  SUBJECTIVE  History of Present Illness: Nichole Smith is a 68 y.o. female seen in follow-up for recurrent UTI, mixed urinary incontinence, stage II pelvic organ prolapse, constipation. Plan at last visit was gemtesa, continue vaginal estrogen.   Reports being pleased with her symptomatic relief with fluid management, caffeine reduction, and Gemtesa.  Last UTI 10/27/23 UA + leuk/heme, culture with > 100K proteus mirabilis resistant macrobid. Rx changed from macrobid to fosfomycin. Reports infrequency stress urinary incontinence, however declines treatment at this time. 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   Past Surgical History: She  has a past surgical history that includes Cesarean section; Shoulder open rotator cuff repair (07/20/2010); Lung tumor removed (2012); Dilatation & currettage/hysteroscopy with resectoscope (N/A, 07/18/2013); Wisdom tooth extraction; Mouth surgery; Colonoscopy (2021); Anterior cervical decomp/discectomy fusion (12/24/2017); Carpal tunnel release (Left, 12/24/2017); Polypectomy; implantable loop recorder placement (05/20/2021); Hysteroscopy with D & C (N/A, 05/05/2023); and Cataract extraction (Bilateral).   Medications: She has a current medication list which includes the following prescription(s): alendronate, atorvastatin, calcium, chlorthalidone, diltiazem, diltiazem, doxepin hcl, estradiol,  gabapentin, gemtesa, metoprolol succinate, multiple vitamins-minerals, phenazopyridine, probiotic product, rivaroxaban, and synthroid.   Allergies: Patient is allergic to neomycin and ace inhibitors.   Social History: Patient  reports that she has never smoked. She has never used smokeless tobacco. She reports current alcohol use. She reports that she does not use drugs.     OBJECTIVE     Physical Exam: Vitals:   01/18/24 1355  BP: 121/70  Pulse: 71   Gen: No apparent distress, A&O x 3.  Detailed Urogynecologic Evaluation:  Deferred.   Lab Results  Component Value Date   COLORU Yellow 10/27/2023   CLARITYU Clear 10/27/2023   GLUCOSEUR Negative 10/27/2023   BILIRUBINUR Negative 10/27/2023   KETONESU Negative 10/27/2023   SPECGRAV <=1.005 (A) 10/27/2023   RBCUR Large 10/27/2023   PHUR 8.5 (A) 10/27/2023   PROTEINUR Positive (A) 10/27/2023   UROBILINOGEN 0.2 10/27/2023   LEUKOCYTESUR Large (3+) (A) 10/27/2023     ASSESSMENT AND PLAN    Ms. Kneeland is a 68 y.o. with:  1. Recurrent UTI   2. Urinary incontinence, mixed   3. Pelvic organ prolapse quantification stage 2 cystocele     Recurrent UTI Assessment & Plan: - last UTI with hematuria 10/27/23, culture with proteus resistant to macrobid and treated with fosfomycin. Prior UA negative 10/19/23. Recommended repeat UA prior to leaving office to ensure resolution of microscopic hematuria.  - For treatment of recurrent urinary tract infections, we discussed management of recurrent UTIs including prophylaxis with a daily low dose antibiotic, transvaginal estrogen therapy, D-mannose, and cranberry supplements.  We discussed the role of diagnostic testing such as cystoscopy and upper tract imaging.   - continue vaginal estrogen 1g twice a week - continue probiotics, explained rationale for probiotic use in the setting of rUTI - discontinue macrobid daily - continue vaginal estrogen 1g 2x/week - reviewed definition of  rUTI, advised pt to return for evaluation if she develops symptoms. Consider resuming antibiotic suppression if > 2 symptomatic, culture proven UTI in 6 months or 3 in 1 year.  Urinary incontinence, mixed Assessment & Plan: - POCT UA negative 10/19/23 - pt pleased with relief after behavioral modification and gemtesa - We discussed the symptoms of overactive bladder (OAB), which include urinary urgency, urinary frequency, nocturia, with or without urge incontinence.  While we do not know the exact etiology of OAB, several treatment options exist. We discussed management including behavioral therapy (decreasing bladder irritants, urge suppression strategies, timed voids, bladder retraining), physical therapy, medication; for refractory cases posterior tibial nerve stimulation, sacral neuromodulation, and intravesical botulinum toxin injection.  For anticholinergic medications, we discussed the potential side effects of anticholinergics including dry eyes, dry mouth, constipation, cognitive impairment and urinary retention. For Beta-3 agonist medication, we discussed the potential side effect of elevated blood pressure which is more likely to occur in individuals with uncontrolled hypertension. - continue Gemtesa - encouraged to continue fluid management, bladder training, monitor caffeine intake, and resuming Kegel exercises - encouraged to continue weight loss - reviewed third line treatments, patient considering botox injections and PTNS. Handout reviewed and provided per pt request - For treatment of stress urinary incontinence,  non-surgical options include expectant management, weight loss, physical therapy, as well as a pessary.  Surgical options include a midurethral sling, Burch urethropexy, and transurethral injection of a bulking agent. - we discussed an office procedure with urethral bulking (Bulkamid). We discussed success rate of approximately 70-80% and possible need for second  injection. We reviewed that this is not a permanent procedure and the Bulkamid does dissolve over time. Risks reviewed including injury to bladder or urethra, UTI, urinary retention and hematuria.  - pt prefers to avoid surgical procedures in the OR   Pelvic organ prolapse quantification stage 2 cystocele Assessment & Plan: - For treatment of pelvic organ prolapse, we discussed options for management including expectant management, conservative management, and surgical management, such as Kegels, a pessary, pelvic floor physical therapy, and specific surgical procedures. - encouraged to continue Kegel exercises and vaginal estrogen - pt's mother had pessary, desires to avoid - encouraged to monitor symptoms and repeat exam if clinical change   Time spent: I spent 22 minutes dedicated to the care of this patient on the date of this encounter to include pre-visit review of records, face-to-face time with the patient discussing recurrent UTI, mixed urinary incontinence, pelvic organ prolapse, and post visit documentation.    Loleta Chance, MD

## 2024-01-18 NOTE — Assessment & Plan Note (Addendum)
-   POCT UA negative 10/19/23 - pt pleased with relief after behavioral modification and gemtesa - We discussed the symptoms of overactive bladder (OAB), which include urinary urgency, urinary frequency, nocturia, with or without urge incontinence.  While we do not know the exact etiology of OAB, several treatment options exist. We discussed management including behavioral therapy (decreasing bladder irritants, urge suppression strategies, timed voids, bladder retraining), physical therapy, medication; for refractory cases posterior tibial nerve stimulation, sacral neuromodulation, and intravesical botulinum toxin injection.  For anticholinergic medications, we discussed the potential side effects of anticholinergics including dry eyes, dry mouth, constipation, cognitive impairment and urinary retention. For Beta-3 agonist medication, we discussed the potential side effect of elevated blood pressure which is more likely to occur in individuals with uncontrolled hypertension. - continue Gemtesa - encouraged to continue fluid management, bladder training, monitor caffeine intake, and resuming Kegel exercises - encouraged to continue weight loss - reviewed third line treatments, patient considering botox injections and PTNS. Handout reviewed and provided per pt request - For treatment of stress urinary incontinence,  non-surgical options include expectant management, weight loss, physical therapy, as well as a pessary.  Surgical options include a midurethral sling, Burch urethropexy, and transurethral injection of a bulking agent. - we discussed an office procedure with urethral bulking (Bulkamid). We discussed success rate of approximately 70-80% and possible need for second injection. We reviewed that this is not a permanent procedure and the Bulkamid does dissolve over time. Risks reviewed including injury to bladder or urethra, UTI, urinary retention and hematuria.  - pt prefers to avoid surgical  procedures in the OR

## 2024-01-18 NOTE — Assessment & Plan Note (Addendum)
-   last UTI with hematuria 10/27/23, culture with proteus resistant to macrobid and treated with fosfomycin. Prior UA negative 10/19/23. Recommended repeat UA prior to leaving office to ensure resolution of microscopic hematuria.  - For treatment of recurrent urinary tract infections, we discussed management of recurrent UTIs including prophylaxis with a daily low dose antibiotic, transvaginal estrogen therapy, D-mannose, and cranberry supplements.  We discussed the role of diagnostic testing such as cystoscopy and upper tract imaging.   - continue vaginal estrogen 1g twice a week - continue probiotics, explained rationale for probiotic use in the setting of rUTI - discontinue macrobid daily - continue vaginal estrogen 1g 2x/week - reviewed definition of rUTI, advised pt to return for evaluation if she develops symptoms. Consider resuming antibiotic suppression if > 2 symptomatic, culture proven UTI in 6 months or 3 in 1 year.

## 2024-01-18 NOTE — Assessment & Plan Note (Signed)
-   For treatment of pelvic organ prolapse, we discussed options for management including expectant management, conservative management, and surgical management, such as Kegels, a pessary, pelvic floor physical therapy, and specific surgical procedures. - encouraged to continue Kegel exercises and vaginal estrogen - pt's mother had pessary, desires to avoid - encouraged to monitor symptoms and repeat exam if clinical change

## 2024-01-18 NOTE — Patient Instructions (Addendum)
Good work! Continue to monitor your fluid and caffeine intake.   Continue vaginal estrogen 1g twice a week.   Continue probiotics.   Continue Gemtesa  Please return to the office for testing if you experience any change with urinary symptoms.

## 2024-01-19 NOTE — Progress Notes (Signed)
 [facility] Summary Report / Loop Recorder

## 2024-01-19 NOTE — Addendum Note (Signed)
Addended by: Geralyn Flash D on: 01/19/2024 10:54 AM   Modules accepted: Orders

## 2024-01-29 ENCOUNTER — Ambulatory Visit: Payer: Medicare Other | Attending: Internal Medicine | Admitting: Emergency Medicine

## 2024-01-29 DIAGNOSIS — Z01810 Encounter for preprocedural cardiovascular examination: Secondary | ICD-10-CM | POA: Insufficient documentation

## 2024-01-29 NOTE — Progress Notes (Signed)
 Virtual Visit via Telephone Note   Because of Nichole Smith co-morbid illnesses, she is at least at moderate risk for complications without adequate follow up.  This format is felt to be most appropriate for this patient at this time.  Due to technical limitations with video connection (technology), today's appointment will be conducted as an audio only telehealth visit, and Nichole Smith verbally agreed to proceed in this manner.   All issues noted in this document were discussed and addressed.  No physical exam could be performed with this format.  Evaluation Performed:  Preoperative cardiovascular risk assessment _____________   Date:  01/29/2024   Patient ID:  Nichole Smith, Nichole Smith February 06, 1956, MRN [id number] Patient Location:  Home Provider location:   Office  Primary Care Provider:  Madelin Headings, MD Primary Cardiologist:  Armanda Magic, MD  Chief Complaint / Patient Profile   68 y.o. y/o female with a h/o hypertension, atrial fibrillation s/p Medtronic loop recorder, obstructive sleep apnea, morbid obesity who is pending lumbar epi with DRI on date TBD and presents today for telephonic preoperative cardiovascular risk assessment.  History of Present Illness    Nichole Smith is a 68 y.o. female who presents via audio/video conferencing for a telehealth visit today.  Pt was last seen in cardiology clinic on 10/21/2023 by [place], [place].  At that time Nichole Smith was doing well.  The patient is now pending procedure as outlined above. Since her last visit, she denies chest pain, shortness of breath, lower extremity edema, fatigue, palpitations, weakness, presyncope, syncope, orthopnea, and PND.  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  Download 08/2014:  Good compliance, breakthru apnea on 10cm.  Changed to auto 5-12cm >> good control of AHI on f/u download ONO on CPAP:       Osteoarthritis    PCOS (polycystic ovarian syndrome)    PMB (postmenopausal bleeding)    SUI (stress urinary incontinence, female)    h/o   Wears glasses    Past Surgical History:  Procedure Laterality Date   ANTERIOR CERVICAL DECOMP/DISCECTOMY FUSION  12/24/2017   Procedure: Cervical five-six Cervical six-seven Anterior cervical decompression/discectomy/fusion;  Surgeon: Maeola Harman, MD;  Location: [facility] OR;  Service: Neurosurgery;;   CARPAL TUNNEL RELEASE Left 12/24/2017   Procedure: LEFT CARPAL TUNNEL RELEASE;  Surgeon: Maeola Harman, MD;  Location: [facility] OR;  Service: Neurosurgery;  Laterality: Left;   CATARACT EXTRACTION Bilateral    Right eye 08/24/2023   CESAREAN SECTION     x 1 - twins   COLONOSCOPY  2021   DILATATION & CURRETTAGE/HYSTEROSCOPY WITH RESECTOCOPE N/A 07/18/2013   Procedure: DILATATION & CURETTAGE/HYSTEROSCOPY WITH RESECTOCOPE;  Surgeon: Annamaria Boots, MD;  Location: [facility];  Service: Gynecology;  Laterality: N/A;  mass resection   HYSTEROSCOPY WITH D & C N/A 05/05/2023   Procedure: DILATATION AND CURETTAGE /HYSTEROSCOPY;  Surgeon:  Jerene Bears, MD;  Location: [facility];  Service: Gynecology;  Laterality: N/A;   implantable loop recorder placement  05/20/2021   Medtronic Reveal Linq model [id number] implantable loop recorder (SN [id number])   Lung tumor removed  2012   Rt lung carcinoid   MOUTH SURGERY     pre-cancerous ulcer removed   POLYPECTOMY     SHOULDER OPEN ROTATOR CUFF REPAIR  07/20/2010   Right   WISDOM TOOTH EXTRACTION       Allergies  Allergies  Allergen Reactions   Neomycin Other (See Comments)    Inflammation--eye drop    Ace Inhibitors Cough    Home Medications    Prior to Admission medications   Medication Sig Start Date End Date Taking? Authorizing Provider  alendronate (FOSAMAX) 70 MG tablet Take 1 tablet (70 mg total) by mouth every 7 (seven) days. Take first thing in am with 6 oz. Water.  Be upright after taking.  Eat nothing for one hour. 01/13/24   Jerene Bears, MD  atorvastatin (LIPITOR) 40 MG tablet Take 1 tablet (40 mg total) by mouth daily. 10/21/23   Sharlene Dory, PA-C  CALCIUM PO Take 1 tablet by mouth at bedtime.     [provider]  chlorthalidone (HYGROTON) 25 MG tablet TAKE 1 TABLET DAILY 11/11/23   Sharlene Dory, PA-C  diltiazem (CARDIZEM CD) 180 MG 24 hr capsule TAKE 1 CAPSULE DAILY 11/11/23   Sharlene Dory, PA-C  diltiazem (CARDIZEM) 30 MG tablet 1 tablet every 6 hours for palpitations Patient taking differently: as needed. 1 tablet every 6 hours for palpitations 07/25/22   Regan Lemming, MD  Doxepin HCl 3 MG TABS TAKE 1 TABLET (3 MG TOTAL) BY MOUTH AT BEDTIME AS NEEDED. 08/17/23   Coralyn Helling, MD  estradiol (ESTRACE) 0.1 MG/GM vaginal cream INSERT 1 GM VAGINALLY TWICE WEEKLY 08/27/23   Jerene Bears, MD  gabapentin (NEURONTIN) 400 MG capsule Take 1 capsule (400 mg total) by mouth 2 (two) times daily. 10/27/23   Judi Saa, DO  GEMTESA 75 MG TABS Take 1 tablet (75 mg total) by mouth daily as needed. 12/15/23   Jerene Bears, MD  metoprolol succinate (TOPROL-XL) 50 MG 24 hr tablet TAKE 1 TABLET DAILY 01/19/24   Quintella Reichert, MD  Multiple Vitamins-Minerals (PRESERVISION AREDS 2 PO) Take 1 tablet by mouth 2 (two) times daily.    [provider]  phenazopyridine (PYRIDIUM) 95 MG tablet Take 1 tablet (95 mg total) by mouth 3 (three) times daily as needed. 10/27/23   Selmer Dominion, NP  Probiotic Product (PROBIOTIC PO) Take by mouth.    [provider]  rivaroxaban (XARELTO) 20 MG TABS tablet Take 1 tablet (20 mg total) by mouth daily with supper. 08/10/23   Fenton, Clint R, PA  SYNTHROID 125 MCG tablet Take 1 tablet (125 mcg total) by mouth daily before breakfast. Dosage change 10/20/23   Panosh, Neta Mends, MD    Physical Exam    Vital Signs:  Nichole Smith does not have vital signs available for review today.  Given telephonic nature of communication, physical exam is limited. AAOx3. NAD. Normal affect.  Speech and respirations are unlabored.  Accessory Clinical Findings    None  Assessment & Plan    1.  Preoperative Cardiovascular Risk Assessment: According to the Revised Cardiac Risk Index (RCRI), her Perioperative Risk of Major Cardiac Event is (%): 0.4. Her Functional Capacity in METs is:  7.37 according to the Duke Activity Status Index (DASI). Therefore, based on ACC/AHA guidelines, patient would be at acceptable risk for the planned procedure without further cardiovascular testing.  The patient was advised that if she develops new symptoms prior to surgery to contact our office to arrange for a follow-up visit, and she verbalized understanding.  Per office protocol, patient can hold Xarelto for 3 days prior to procedure.    A copy of this note will be routed to requesting surgeon.  Time:   Today, I have spent 8 minutes with the patient with telehealth technology discussing medical history, symptoms, and management plan.     Denyce Robert, NP  01/29/2024, 7:58 AM

## 2024-02-02 NOTE — Discharge Instructions (Signed)
 Post Procedure Spinal Discharge Instruction Sheet  You may resume a regular diet and any medications that you routinely take (including pain medications) unless otherwise noted by MD.  No driving day of procedure.  Light activity throughout the rest of the day.  Do not do any strenuous work, exercise, bending or lifting.  The day following the procedure, you can resume normal physical activity but you should refrain from exercising or physical therapy for at least three days thereafter.  You may apply ice to the injection site, 20 minutes on, 20 minutes off, as needed. Do not apply ice directly to skin.    Common Side Effects:  Headaches- take your usual medications as directed by your physician.  Increase your fluid intake.  Caffeinated beverages may be helpful.  Lie flat in bed until your headache resolves.  Restlessness or inability to sleep- you may have trouble sleeping for the next few days.  Ask your referring physician if you need any medication for sleep.  Facial flushing or redness- should subside within a few days.  Increased pain- a temporary increase in pain a day or two following your procedure is not unusual.  Take your pain medication as prescribed by your referring physician.  Leg cramps  Please contact our office at [phone] for the following symptoms: Fever greater than 100 degrees. Headaches unresolved with medication after 2-3 days. Increased swelling, pain, or redness at injection site.   Thank you for visiting [facility] today.   YOU MAY RESUME YOUR XARELTO IN 48 HOURS.

## 2024-02-04 ENCOUNTER — Ambulatory Visit
Admission: RE | Admit: 2024-02-04 | Discharge: 2024-02-04 | Disposition: A | Discharge status: Home / Self Care | Payer: Medicare Other | Source: Ambulatory Visit | Attending: Family Medicine | Admitting: Family Medicine

## 2024-02-04 DIAGNOSIS — M5416 Radiculopathy, lumbar region: Secondary | ICD-10-CM

## 2024-02-04 MED ORDER — METHYLPREDNISOLONE ACETATE 40 MG/ML INJ SUSP (RADIOLOG
80.0000 mg | Freq: Once | INTRAMUSCULAR | Status: AC
Start: 2024-02-04 — End: 2024-02-04
  Administered 2024-02-04: 80 mg via EPIDURAL

## 2024-02-04 MED ORDER — IOPAMIDOL (ISOVUE-M 200) INJECTION 41%
1.0000 mL | Freq: Once | INTRAMUSCULAR | Status: AC
Start: 2024-02-04 — End: 2024-02-04
  Administered 2024-02-04: 1 mL via EPIDURAL

## 2024-02-09 NOTE — Progress Notes (Unsigned)
 SUBJECTIVE: Discussed the use of AI scribe software for clinical note transcription with the patient, who gave verbal consent to proceed.  Chief Complaint: Obesity  Interim History: She is down 5 lbs.  Down 58 lbs overall !!! TBW loss of 24.4% !!!  Nichole Smith is here to discuss her progress with her obesity treatment plan. She is on the keeping a food journal and adhering to recommended goals of 1200-1300 calories and 90 grams of protein and states she is following her eating plan approximately 70 % of the time. She states she is exercising walking/water aerobics, strength training 35 minutes 7 times per week.  The patient is a 68 year old with obesity, known coronary artery calcification, hyperlipidemia with elevated Lipoprotein A, and prediabetes who presents for a follow-up on her obesity treatment plan.  She is currently on tirzepatide for cardiovascular risk reduction and management of prediabetes and obesity. She has lost 58 pounds but notes fluctuations in her weight loss progress, sometimes gaining weight despite maintaining a very low-calorie intake. She describes her average caloric intake - does better when closer to 1200 calories and weight gain when as little as 1400 calories on average.  She experiences hunger towards the end of the week after her tirzepatide injection. We discussed the challenges of promoting and maintaining weight loss, including adaptive thermogenesis. Provided hand out on adaptive thermogenesis.  She underwent a LESI to back last week, but it has not provided much relief of her back pain as yet.   She describes tightness in her calves and Achilles tendon, which she attributes to practicing a dance for her daughter's wedding. She plans to use a  lower leg brace for support.We discussed treatments for achilles tendonitis and she has been treated for this in the past and still has the exercises to perform.   She discusses her social activities, including preparing  for her daughter's wedding and hosting family from [place]. Congrats ! She mentions dietary adjustments for family members with diabetes and her own dietary tracking, including being mindful of small caloric intakes like peppermints and jelly. OBJECTIVE: Visit Diagnoses: Problem List Items Addressed This Visit     Coronary artery calcification - Primary   Vitamin D deficiency   Prediabetes   Obesity, Starting BMI 46.48   Relevant Medications   ZEPBOUND 15 MG/0.5ML Pen   Other Visit Diagnoses       Elevated lipoprotein(a)         Achilles tendinitis, unspecified laterality         BMI 36.0-36.9,adult Current BMI 36.4         Obesity She is on a obesity management plan and has lost 58 pounds. Currently on tirzepatide 15 mg weekly for primary indication of CV risk reduction. It is beneficial for both obesity management and cardiovascular risk reduction. Suspect she is experiencing adaptive thermogenesis, a natural response to significant weight loss, causing plateaus. Explained adaptive thermogenesis and suggested increasing caloric intake modestly to encourage increased t metabolism and prevent further metabolic slowdown.  Encouraged to focus on overall health and well-being rather than a specific goal weight. Discussed the potential for newer medications with greater weight loss efficacy in the future. - Continue tirzepatide for weight management and cardiovascular risk reduction. - Increase caloric intake by 150-200 calories for a short period 1 week at a time to counteract adaptive thermogenesis. - Encourage consumption of more protein and healthy calories. - Educate on adaptive thermogenesis and provide reading material.  Coronary Artery Calcification with Hyperlipidemia  and Elevated Lipoprotein A Coronary artery calcification with hyperlipidemia and elevated lipoprotein A. On tirzepatide for cardiovascular risk reduction, which is also beneficial for her prediabetes and obesity  management. - Continue tirzepatide 15 mg weekly for cardiovascular risk reduction. Refilling for 90 days at a time.   Achilles Tendonitis Reports tightness in the calves and discomfort in the Achilles tendon, likely due to tight calf muscles. Exacerbated by recent physical activity and stress related to an upcoming family event. Recommended specific exercises and stretches to alleviate the tightness and discomfort. - Perform heel drop exercises on a step to stretch the Achilles tendon. - Use ice massage for pain relief and comfort. - Review and perform recommended stretches regularly.  Follow-up Scheduled for a follow-up appointment to continue monitoring her progress and management of her conditions. - Schedule follow-up appointment for Wednesday, April 23, at 10:30 AM.  Vitals Temp: 97.9 F (36.6 C) BP: 106/70 Pulse Rate: 69 SpO2: 98 %   Anthropometric Measurements Height: 4\' 11"  (1.499 m) Weight: 180 lb (81.6 kg) BMI (Calculated): 36.34 Weight at Last Visit: 185 lb Weight Lost Since Last Visit: 5 lb Weight Gained Since Last Visit: 0 Starting Weight: 238 lb Total Weight Loss (lbs): 58 lb (26.3 kg) Peak Weight: 238 lb   Body Composition  Body Fat %: 45.9 % Fat Mass (lbs): 82.6 lbs Muscle Mass (lbs): 92.4 lbs Total Body Water (lbs): 66.6 lbs Visceral Fat Rating : 15   Other Clinical Data Fasting: no Labs: no Today's Visit #: 33 Starting Date: 01/12/19     ASSESSMENT AND PLAN:  Diet: Amarionna is currently in the action stage of change. As such, her goal is to continue with weight loss efforts and has agreed to keeping a food journal and adhering to recommended goals of 1200-1300 calories and 85-90 grams of  protein.   Exercise:  Continue exercising as you are able to do  Behavior Modification:  We discussed the following Behavioral Modification Strategies today: increasing lean protein intake, decreasing simple carbohydrates, increasing vegetables, increase  H2O intake, no skipping meals, celebration eating strategies, avoiding temptations, and planning for success. We discussed various medication options to help Kimari with her weight loss efforts and we both agreed to continue Zepbound 15 mg weekly for primary indication of CAD for CV risk reduction and to promote medical weight loss and continue to work on nutritional and behavioral strategies to promote weight loss.  .  Return in about 6 weeks (around 03/23/2024).[name] She was informed of the importance of frequent follow up visits to maximize her success with intensive lifestyle modifications for her multiple health conditions.  Attestation Statements:   Reviewed by clinician on day of visit: allergies, medications, problem list, medical history, surgical history, family history, social history, and previous encounter notes.   Time spent on visit including pre-visit chart review and post-visit care and charting was 41 minutes  Carolyn Maniscalco,PA-C

## 2024-02-10 ENCOUNTER — Encounter (INDEPENDENT_AMBULATORY_CARE_PROVIDER_SITE_OTHER): Payer: Self-pay | Admitting: Physician Assistant

## 2024-02-10 ENCOUNTER — Ambulatory Visit (INDEPENDENT_AMBULATORY_CARE_PROVIDER_SITE_OTHER): Payer: Medicare Other | Admitting: Physician Assistant

## 2024-02-10 VITALS — BP 106/70 | HR 69 | Temp 97.9°F | Ht 59.0 in | Wt 180.0 lb

## 2024-02-10 DIAGNOSIS — E669 Obesity, unspecified: Secondary | ICD-10-CM

## 2024-02-10 DIAGNOSIS — Z6836 Body mass index (BMI) 36.0-36.9, adult: Secondary | ICD-10-CM

## 2024-02-10 DIAGNOSIS — E559 Vitamin D deficiency, unspecified: Secondary | ICD-10-CM

## 2024-02-10 DIAGNOSIS — E7841 Elevated Lipoprotein(a): Secondary | ICD-10-CM

## 2024-02-10 DIAGNOSIS — R7303 Prediabetes: Secondary | ICD-10-CM | POA: Diagnosis not present

## 2024-02-10 DIAGNOSIS — M7660 Achilles tendinitis, unspecified leg: Secondary | ICD-10-CM | POA: Diagnosis not present

## 2024-02-10 DIAGNOSIS — I2510 Atherosclerotic heart disease of native coronary artery without angina pectoris: Secondary | ICD-10-CM | POA: Diagnosis not present

## 2024-02-15 ENCOUNTER — Ambulatory Visit (INDEPENDENT_AMBULATORY_CARE_PROVIDER_SITE_OTHER): Payer: Medicare Other

## 2024-02-15 DIAGNOSIS — I480 Paroxysmal atrial fibrillation: Secondary | ICD-10-CM | POA: Diagnosis not present

## 2024-02-15 DIAGNOSIS — R002 Palpitations: Secondary | ICD-10-CM

## 2024-02-16 LAB — CUP PACEART REMOTE DEVICE CHECK
Date Time Interrogation Session: 20250316230613
Implantable Pulse Generator Implant Date: 20220620

## 2024-02-22 NOTE — Progress Notes (Signed)
 [facility] Summary Report / Loop Recorder

## 2024-03-07 ENCOUNTER — Ambulatory Visit (HOSPITAL_BASED_OUTPATIENT_CLINIC_OR_DEPARTMENT_OTHER): Admitting: *Deleted

## 2024-03-07 VITALS — BP 114/53 | HR 72 | Wt 180.0 lb

## 2024-03-07 DIAGNOSIS — R300 Dysuria: Secondary | ICD-10-CM | POA: Diagnosis not present

## 2024-03-07 LAB — POCT URINALYSIS DIPSTICK
Appearance: NORMAL
Bilirubin, UA: NEGATIVE
Glucose, UA: NEGATIVE
Ketones, UA: NEGATIVE
Nitrite, UA: NEGATIVE
Protein, UA: POSITIVE — AB
Spec Grav, UA: 1.015 (ref 1.010–1.025)
Urobilinogen, UA: 0.2 U/dL
pH, UA: 7 (ref 5.0–8.0)

## 2024-03-07 MED ORDER — NITROFURANTOIN MONOHYD MACRO 100 MG PO CAPS
100.0000 mg | ORAL_CAPSULE | Freq: Two times a day (BID) | ORAL | 0 refills | Status: DC
Start: 2024-03-07 — End: 2024-06-01

## 2024-03-07 NOTE — Progress Notes (Signed)
 NURSE VISIT- UTI SYMPTOMS   SUBJECTIVE:  Nichole Smith is a 68 y.o. G31P1003 female here for UTI symptoms. She is a GYN patient. She reports dysuria.  OBJECTIVE:  BP (!) 114/53 (Cuff Size: Large)   Pulse 72   Wt 180 lb (81.6 kg)   LMP 02/12/2011   BMI 36.36 kg/m   Appears well, in no apparent distress  Results for orders placed or performed in visit on 03/07/24 (from the past 24 hours)  POCT urinalysis dipstick   Collection Time: 03/07/24 11:32 AM  Result Value Ref Range   Color, UA yellow    Clarity, UA clear    Glucose, UA Negative Negative   Bilirubin, UA neg    Ketones, UA neg    Spec Grav, UA 1.015 1.010 - 1.025   Blood, UA large    pH, UA 7.0 5.0 - 8.0   Protein, UA Positive (A) Negative   Urobilinogen, UA 0.2 0.2 or 1.0 E.U./dL   Nitrite, UA neg    Leukocytes, UA Large (3+) (A) Negative   Appearance normal    Odor none    *Note: Due to a large number of results and/or encounters for the requested time period, some results have not been displayed. A complete set of results can be found in Results Review.    ASSESSMENT: GYN patient with UTI symptoms and negative nitrites  PLAN: Visit routed to or discussed with:  Rx sent today: Yes Urine culture sent Call or return to clinic prn if these symptoms worsen or fail to improve as anticipated. Follow-up: as needed

## 2024-03-10 LAB — URINE CULTURE

## 2024-03-15 ENCOUNTER — Other Ambulatory Visit (HOSPITAL_BASED_OUTPATIENT_CLINIC_OR_DEPARTMENT_OTHER)

## 2024-03-15 DIAGNOSIS — R300 Dysuria: Secondary | ICD-10-CM

## 2024-03-17 ENCOUNTER — Encounter (HOSPITAL_BASED_OUTPATIENT_CLINIC_OR_DEPARTMENT_OTHER): Payer: Self-pay | Admitting: Certified Nurse Midwife

## 2024-03-17 LAB — URINE CULTURE

## 2024-03-21 ENCOUNTER — Ambulatory Visit: Payer: Medicare Other

## 2024-03-21 DIAGNOSIS — I480 Paroxysmal atrial fibrillation: Secondary | ICD-10-CM

## 2024-03-21 DIAGNOSIS — R002 Palpitations: Secondary | ICD-10-CM

## 2024-03-21 LAB — CUP PACEART REMOTE DEVICE CHECK
Date Time Interrogation Session: 20250420230521
Implantable Pulse Generator Implant Date: 20220620

## 2024-03-23 ENCOUNTER — Ambulatory Visit (INDEPENDENT_AMBULATORY_CARE_PROVIDER_SITE_OTHER): Admitting: Physician Assistant

## 2024-03-23 ENCOUNTER — Encounter (INDEPENDENT_AMBULATORY_CARE_PROVIDER_SITE_OTHER): Payer: Self-pay | Admitting: Physician Assistant

## 2024-03-23 VITALS — BP 113/77 | HR 69 | Temp 97.4°F | Ht 59.0 in | Wt 186.0 lb

## 2024-03-23 DIAGNOSIS — E7841 Elevated Lipoprotein(a): Secondary | ICD-10-CM

## 2024-03-23 DIAGNOSIS — I2510 Atherosclerotic heart disease of native coronary artery without angina pectoris: Secondary | ICD-10-CM | POA: Diagnosis not present

## 2024-03-23 DIAGNOSIS — M2550 Pain in unspecified joint: Secondary | ICD-10-CM | POA: Diagnosis not present

## 2024-03-23 DIAGNOSIS — E669 Obesity, unspecified: Secondary | ICD-10-CM

## 2024-03-23 DIAGNOSIS — Z6837 Body mass index (BMI) 37.0-37.9, adult: Secondary | ICD-10-CM

## 2024-03-23 MED ORDER — ZEPBOUND 15 MG/0.5ML ~~LOC~~ SOAJ: 15.0000 mg | SUBCUTANEOUS | 0 refills | Status: DC

## 2024-03-23 NOTE — Progress Notes (Signed)
 SUBJECTIVE: Discussed the use of AI scribe software for clinical note transcription with the patient, who gave verbal consent to proceed.  Chief Complaint: Obesity  Interim History: She is up 6 lbs since her last visit Down 52 lbs overall TBW loss of 21.8%  Nichole Smith is here to discuss her progress with her obesity treatment plan. She is on the keeping a food journal and adhering to recommended goals of 1300 calories and 90 protein and states she is following her eating plan approximately 40-80 % of the time. She states she is exercising walking and walking the dog, swimming, strength training for 20 minutes 7 times per week.  Nichole Smith is a 68 year old female with obesity who presents for follow-up of her obesity treatment plan.  She has experienced a decrease in muscle mass by 1.8 pounds, which she attributes to stopping her arm exercises due to throbbing pain in her hands, particularly in the MCP joints, which are red and swollen. She has experienced similar symptoms in the past and completed a round of prednisone  previously.  She notes an increase in water weight, as evidenced by her rings feeling tight, and attributes some weight gain to dietary choices during Easter, including consuming ham and desserts. She has a persistent craving for sweets, which she manages by not keeping them in the house. Despite these challenges, she has lost 52 pounds overall but notices increased cravings before her weekly medication dose.  Her dietary habits include a focus on protein intake and recent efforts to increase fiber consumption. She finds it challenging to meet her fiber goals, often falling short of the recommended 25-30 grams per day, and uses psyllium husk capsules to supplement her fiber intake. She typically consumes 1300 calories per day with a target of 90 grams of protein.  OBJECTIVE: Visit Diagnoses: Problem List Items Addressed This Visit     PAIN IN JOINT, MULTIPLE SITES   Coronary  artery calcification - Primary   Relevant Medications   ZEPBOUND  15 MG/0.5ML Pen   Obesity, Starting BMI 46.48   Relevant Medications   ZEPBOUND  15 MG/0.5ML Pen   Other Visit Diagnoses       Elevated lipoprotein(a)       Relevant Medications   ZEPBOUND  15 MG/0.5ML Pen     BMI 37.0-37.9, adult Current BMi 37.6           Obesity Obesity management is ongoing. Weight has increased slightly due to recent holiday/wedding indulgences, with a decrease in muscle mass by 1.8 pounds likely due to reduced physical activity from hand and finger issues. Water weight has increased by 4.2 pounds as well.  Zepbound  is effective in reducing cardiovascular risk and maintaining a 52-pound weight loss.  She reports persistent sugar cravings but manages them by avoiding sweets at home.  Discussed the importance of protein and fiber intake in managing weight and cravings. She is considering adding beans back into her diet for fiber, despite concerns about protein content. Discussed strategies for managing diet during holidays and vacations, including protein loading before meals. - Refill Zepbound  prescription and send to [facility] for a 66-month supply with primary indication of hyperlipidemia/CAD. - Encourage continued focus on protein and fiber intake to manage weight and cravings. - Encourage tracking of dietary intake, even on challenging days, to identify patterns and areas for improvement.  Hyperlipidemia with elevated lipoprotein a and known coronary artery calcification On Zepbound  15 mg weekly as well as lipitor 40 mg daily. Denies mass in  neck, dysphagia, dyspepsia, persistent hoarseness, abdominal pain, or N/V/Constipation or diarrhea. Has annual eye exam. Mood is stable.  No side effects with statin.  Lab Results  Component Value Date   CHOL 141 10/07/2023   CHOL 119 08/18/2022   CHOL 148 10/03/2021   Lab Results  Component Value Date   HDL 61 10/07/2023   HDL 52.70 08/18/2022    HDL 58 10/03/2021   Lab Results  Component Value Date   LDLCALC 68 10/07/2023   LDLCALC 57 08/18/2022   LDLCALC 76 10/03/2021   Lab Results  Component Value Date   TRIG 55 10/07/2023   TRIG 46.0 08/18/2022   TRIG 71 10/03/2021   Lab Results  Component Value Date   CHOLHDL 2 08/18/2022   CHOLHDL 2 07/30/2021   CHOLHDL 2 06/07/2018   No results found for: "LDLDIRECT" Continue Lipitor and Zepbound  15 mg weekly.  Continue to work on nutrition plan -decreasing simple carbohydrates, increasing lean proteins, decreasing saturated fats and cholesterol , avoiding trans fats and exercise as able to promote weight loss, improve lipids and decrease cardiovascular risks. Meds ordered this encounter  Medications   ZEPBOUND  15 MG/0.5ML Pen    Sig: Inject 15 mg into the skin once a week.    Dispense:  6 mL    Refill:  0   ?Inflammatory arthritis Reports throbbing pain and redness in hands and fingers, suggestive of inflammatory arthritis. Symptoms are asymmetrical, consistent with certain forms of inflammatory arthropathy. She has experienced similar symptoms previously but lacks a formal diagnosis. Currently not on arthritis medication due to concerns about side effects, though she has used prednisone  in the past.  - Advise taking pictures of hand symptoms for future reference. - Recommend follow-up if symptoms do not improve in the next couple of weeks.    Vitals Temp: (!) 97.4 F (36.3 C) BP: 113/77 Pulse Rate: 69 SpO2: 95 %   Anthropometric Measurements Height: 4\' 11"  (1.499 m) Weight: 186 lb (84.4 kg) BMI (Calculated): 37.55 Weight at Last Visit: 180 lb Weight Lost Since Last Visit: 0 Weight Gained Since Last Visit: 6 lb Starting Weight: 238 lb Total Weight Loss (lbs): 52 lb (23.6 kg) Peak Weight: 238 lb   Body Composition  Body Fat %: 48.8 % Fat Mass (lbs): 90.8 lbs Muscle Mass (lbs): 90.6 lbs Total Body Water (lbs): 70.8 lbs Visceral Fat Rating : 16   Other  Clinical Data Fasting: no Labs: no Today's Visit #: 40 Starting Date: 01/12/19     ASSESSMENT AND PLAN:  Diet: Nichole Smith is currently in the action stage of change. As such, her goal is to get back to weight loss efforts. She has agreed to keeping a food journal and adhering to recommended goals of 1300 calories and 90 grams of protein.  Exercise: Nichole Smith has been instructed to work up to a goal of 150 minutes of combined cardio and strengthening exercise per week for weight loss and overall health benefits.   Behavior Modification:  We discussed the following Behavioral Modification Strategies today: increasing lean protein intake, decreasing simple carbohydrates, increasing vegetables, increase H2O intake, increase high fiber foods, no skipping meals, holiday eating strategies, celebration eating strategies, avoiding temptations, planning for success, and keep a strict food journal. We discussed various medication options to help Nichole Smith with her weight loss efforts and we both agreed to continue current plan and continue to work on nutritional and behavioral strategies to promote weight loss.  .  Return in about 4 weeks (around  04/20/2024).Nichole Smith She was informed of the importance of frequent follow up visits to maximize her success with intensive lifestyle modifications for her multiple health conditions.  Attestation Statements:   Reviewed by clinician on day of visit: allergies, medications, problem list, medical history, surgical history, family history, social history, and previous encounter notes.   Time spent on visit including pre-visit chart review and post-visit care and charting was 37 minutes.    Fabyan Loughmiller, PA-C

## 2024-04-01 NOTE — Progress Notes (Unsigned)
 [facility] [address] [place] [address] Phone: [phone] Subjective:   Nichole Smith, am serving as a scribe for Dr. Ronnell Coins.  I'm seeing this patient by the request  of:  Panosh, Joaquim Muir, MD  CC: Back pain follow-up  BJY:[id number]  01/13/2024 Severe spinal stenosis that has been doing better since patient has lost 50 pounds.  Last epidural was greater than 7 months ago.  Hopeful that patient will continue to do well.  Will get a another epidural at this time to see if we can do to keep patient active.  Patient's BMI is under 40 which is wonderful.  Follow-up with me again 3 months     Updated 04/04/2024 Nichole Smith is a 68 y.o. female coming in with complaint of back pain. Epidural 02/04/2024. Patient states that the epidural was not helpful. Had some prednisone  left over from another issue and this was helpful. Took away her pain. Pain in the B glutes and lateral hips. Having a hard time with sit to stand.   Using tart cherry extract. Taking 2 pills at night. Not sure if it is helping.   Also unsure if gabapentin  is helping. Using 400mg  QHS and 400mg  in morning.       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  Download 08/2014:  Good compliance, breakthru apnea on 10cm.  Changed to auto 5-12cm >> good control of AHI on f/u download ONO on CPAP:       Osteoarthritis    PCOS (polycystic ovarian syndrome)    PMB (postmenopausal bleeding)    SUI (stress  urinary incontinence, female)    h/o   Wears glasses    Past Surgical History:  Procedure Laterality Date   ANTERIOR CERVICAL DECOMP/DISCECTOMY FUSION  12/24/2017   Procedure: Cervical five-six Cervical six-seven Anterior cervical decompression/discectomy/fusion;  Surgeon: Manya Sells, MD;  Location: [facility] OR;  Service: Neurosurgery;;   CARPAL TUNNEL RELEASE Left 12/24/2017   Procedure: LEFT CARPAL TUNNEL RELEASE;  Surgeon: Manya Sells, MD;  Location: [facility] OR;  Service: Neurosurgery;  Laterality: Left;   CATARACT EXTRACTION Bilateral    Right eye 08/24/2023   CESAREAN SECTION     x 1 - twins   COLONOSCOPY  2021   DILATATION & CURRETTAGE/HYSTEROSCOPY WITH RESECTOCOPE N/A 07/18/2013   Procedure: DILATATION & CURETTAGE/HYSTEROSCOPY WITH RESECTOCOPE;  Surgeon: Axel Bohr, MD;  Location: [facility];  Service: Gynecology;  Laterality: N/A;  mass resection   HYSTEROSCOPY WITH D & C N/A 05/05/2023   Procedure: DILATATION AND CURETTAGE /HYSTEROSCOPY;  Surgeon: Lillian Rein, MD;  Location: [facility];  Service: Gynecology;  Laterality: N/A;   implantable loop recorder placement  05/20/2021   Medtronic Reveal Linq model [id number] implantable loop recorder (SN [id number])   Lung tumor removed  2012   Rt lung carcinoid   MOUTH SURGERY     pre-cancerous ulcer  removed   POLYPECTOMY     SHOULDER OPEN ROTATOR CUFF REPAIR  07/20/2010   Right   WISDOM TOOTH EXTRACTION     Social History   Socioeconomic History   Marital status: Married    Spouse name: Eddie Good   Number of children: 2   Years of education: Not on file   Highest education level: Bachelor's degree (e.g., BA, AB, BS)  Occupational History   Occupation: retired    Associate Professor: Museum/gallery exhibitions officer  Tobacco Use   Smoking status: Never   Smokeless tobacco: Never   Tobacco comments:    Never smoked 08/07/23  Vaping Use   Vaping status: Never Used  Substance and Sexual Activity   Alcohol use: Yes     Comment: occ   Drug use: No   Sexual activity: Not Currently    Partners: Male    Birth control/protection: Post-menopausal  Other Topics Concern   Not on file  Social History Narrative   hhof 2    2 Children at college and  beyond      Aubery Blare    Married    Retired age 8 2015   Recently moved taking care of grandchildren during the week.   Social Drivers of Corporate investment banker Strain: Low Risk  (12/24/2023)   Overall Financial Resource Strain (CARDIA)    Difficulty of Paying Living Expenses: Not hard at all  Food Insecurity: No Food Insecurity (12/24/2023)   Hunger Vital Sign    Worried About Running Out of Food in the Last Year: Never true    Ran Out of Food in the Last Year: Never true  Transportation Needs: No Transportation Needs (12/24/2023)   PRAPARE - Administrator, Civil Service (Medical): No    Lack of Transportation (Non-Medical): No  Physical Activity: Sufficiently Active (12/24/2023)   Exercise Vital Sign    Days of Exercise per Week: 5 days    Minutes of Exercise per Session: 30 min  Recent Concern: Physical Activity - Insufficiently Active (10/17/2023)   Exercise Vital Sign    Days of Exercise per Week: 6 days    Minutes of Exercise per Session: 20 min  Stress: No Stress Concern Present (12/24/2023)   [employer] of Occupational Health - Occupational Stress Questionnaire    Feeling of Stress : Only a little  Social Connections: Socially Integrated (12/24/2023)   Social Connection and Isolation Panel [NHANES]    Frequency of Communication with Friends and Family: Twice a week    Frequency of Social Gatherings with Friends and Family: Twice a week    Attends Religious Services: More than 4 times per year    Active Member of [employer] or Organizations: Yes    Attends Engineer, structural: More than 4 times per year    Marital Status: Married   Allergies  Allergen Reactions   Neomycin Other (See Comments)     Inflammation--eye drop    Ace Inhibitors Cough   Family History  Problem Relation Age of Onset   Stroke Mother        died age 28   Hypertension Mother    Sudden death Mother    Alcoholism Mother    Coronary artery disease Father        died age 69   Hypertension Father    Liver disease Father    Alcohol abuse Father    Obesity Father    Parkinson's disease Brother    Diabetes Brother  Hypertension Brother    Hypertension Maternal Uncle    Colon cancer Neg Hx    Rectal cancer Neg Hx    Stomach cancer Neg Hx    Colon polyps Neg Hx    Esophageal cancer Neg Hx     Current Outpatient Medications (Endocrine & Metabolic):    alendronate  (FOSAMAX ) 70 MG tablet, Take 1 tablet (70 mg total) by mouth every 7 (seven) days. Take first thing in am with 6 oz. Water.  Be upright after taking.  Eat nothing for one hour.   SYNTHROID  125 MCG tablet, Take 1 tablet (125 mcg total) by mouth daily before breakfast. Dosage change  Current Outpatient Medications (Cardiovascular):    atorvastatin  (LIPITOR) 40 MG tablet, Take 1 tablet (40 mg total) by mouth daily.   chlorthalidone  (HYGROTON ) 25 MG tablet, TAKE 1 TABLET DAILY   diltiazem  (CARDIZEM  CD) 180 MG 24 hr capsule, TAKE 1 CAPSULE DAILY   diltiazem  (CARDIZEM ) 30 MG tablet, 1 tablet every 6 hours for palpitations (Patient taking differently: as needed. 1 tablet every 6 hours for palpitations)   metoprolol  succinate (TOPROL -XL) 50 MG 24 hr tablet, TAKE 1 TABLET DAILY    Current Outpatient Medications (Hematological):    rivaroxaban  (XARELTO ) 20 MG TABS tablet, Take 1 tablet (20 mg total) by mouth daily with supper.  Current Outpatient Medications (Other):    CALCIUM  PO, Take 1 tablet by mouth at bedtime.    Doxepin  HCl 3 MG TABS, TAKE 1 TABLET (3 MG TOTAL) BY MOUTH AT BEDTIME AS NEEDED.   estradiol  (ESTRACE ) 0.1 MG/GM vaginal cream, INSERT 1 GM VAGINALLY TWICE WEEKLY   gabapentin  (NEURONTIN ) 400 MG capsule, Take 1 capsule (400 mg total)  by mouth 2 (two) times daily.   GEMTESA  75 MG TABS, Take 1 tablet (75 mg total) by mouth daily as needed.   Multiple Vitamins-Minerals (PRESERVISION AREDS 2 PO), Take 1 tablet by mouth 2 (two) times daily.   nitrofurantoin , macrocrystal-monohydrate, (MACROBID ) 100 MG capsule, Take 1 capsule (100 mg total) by mouth 2 (two) times daily.   phenazopyridine  (PYRIDIUM ) 95 MG tablet, Take 1 tablet (95 mg total) by mouth 3 (three) times daily as needed.   Probiotic Product (PROBIOTIC PO), Take by mouth.   ZEPBOUND  15 MG/0.5ML Pen, Inject 15 mg into the skin once a week.   Reviewed prior external information including notes and imaging from  primary care provider As well as notes that were available from care everywhere and other healthcare systems.  Past medical history, social, surgical and family history all reviewed in electronic medical record.  No pertanent information unless stated regarding to the chief complaint.   Review of Systems:  No headache, visual changes, nausea, vomiting, diarrhea, constipation, dizziness, abdominal pain, skin rash, fevers, chills, night sweats, weight loss, swollen lymph nodes, body aches, joint swelling, chest pain, shortness of breath, mood changes. POSITIVE muscle aches  Objective  Blood pressure 112/82, pulse 71, height 4\' 11"  (1.499 m), weight 189 lb (85.7 kg), last menstrual period 02/12/2011, SpO2 97%.   General: No apparent distress alert and oriented x3 mood and affect normal, dressed appropriately.  HEENT: Pupils equal, extraocular movements intact  Respiratory: Patient's speak in full sentences and does not appear short of breath  Cardiovascular: No lower extremity edema, non tender, no erythema  Difficulty going from seated to standing.  Ambulates with more of a severe antalgic gait. Back exam shows significant tightness in the knee 5 degrees of extension and only 15 degrees of flexion.  Unable  to do straight leg test secondary to significant  discomfort and pain.   Impression and Recommendations:    The above documentation has been reviewed and is accurate and complete Arzella Rehmann M Serafino Burciaga, DO

## 2024-04-03 ENCOUNTER — Encounter: Payer: Self-pay | Admitting: Internal Medicine

## 2024-04-04 ENCOUNTER — Encounter: Payer: Self-pay | Admitting: Internal Medicine

## 2024-04-04 ENCOUNTER — Ambulatory Visit (INDEPENDENT_AMBULATORY_CARE_PROVIDER_SITE_OTHER): Payer: Medicare Other | Admitting: Family Medicine

## 2024-04-04 VITALS — BP 112/82 | HR 71 | Ht 59.0 in | Wt 189.0 lb

## 2024-04-04 DIAGNOSIS — E063 Autoimmune thyroiditis: Secondary | ICD-10-CM | POA: Diagnosis not present

## 2024-04-04 DIAGNOSIS — M5416 Radiculopathy, lumbar region: Secondary | ICD-10-CM | POA: Diagnosis not present

## 2024-04-04 DIAGNOSIS — Z79899 Other long term (current) drug therapy: Secondary | ICD-10-CM

## 2024-04-04 LAB — TSH: TSH: 1.75 u[IU]/mL (ref 0.35–5.50)

## 2024-04-04 NOTE — Patient Instructions (Signed)
 MRI lumbar [place] Epidural Read about [employer] Let's make appt 6 weeks after injection

## 2024-04-04 NOTE — Progress Notes (Signed)
 Tsh in range

## 2024-04-04 NOTE — Assessment & Plan Note (Signed)
 Continues to have back pain.  Going off of a older MRI.Nichole Smith  Attempted another injection based on the old one but not getting the relief that she was getting previously.  At this point I do feel advanced imaging is warranted.  Will get an MRI to further evaluate to see if another treatment is better.  Previously did have a severe spinal stenosis that we will need to continue to monitor.  Patient knows of worsening symptoms to seek medical attention immediately.  Follow-up with me again after MRI to discuss further treatment options

## 2024-04-07 NOTE — Addendum Note (Signed)
 Addended by: Edra Govern D on: 04/07/2024 11:08 AM   Modules accepted: Orders

## 2024-04-07 NOTE — Progress Notes (Signed)
 [facility] Summary Report / Loop Recorder

## 2024-04-09 ENCOUNTER — Ambulatory Visit

## 2024-04-09 DIAGNOSIS — M549 Dorsalgia, unspecified: Secondary | ICD-10-CM | POA: Diagnosis not present

## 2024-04-09 DIAGNOSIS — M5416 Radiculopathy, lumbar region: Secondary | ICD-10-CM

## 2024-04-14 ENCOUNTER — Other Ambulatory Visit (HOSPITAL_COMMUNITY): Payer: Self-pay | Admitting: Physician Assistant

## 2024-04-14 ENCOUNTER — Other Ambulatory Visit: Payer: Self-pay | Admitting: Family Medicine

## 2024-04-15 NOTE — Telephone Encounter (Signed)
 Prescription refill request for Xarelto  received.  Indication:afib Last office visit:2/25 Weight:85.7  kg Age:68 Scr:0.60  11/24 CrCl:119.72  ml/min  Prescription refilled

## 2024-04-18 ENCOUNTER — Encounter: Payer: Self-pay | Admitting: Family Medicine

## 2024-04-18 ENCOUNTER — Ambulatory Visit: Payer: Self-pay | Admitting: Family Medicine

## 2024-04-19 ENCOUNTER — Other Ambulatory Visit: Payer: Self-pay

## 2024-04-19 DIAGNOSIS — M5416 Radiculopathy, lumbar region: Secondary | ICD-10-CM

## 2024-04-19 MED ORDER — PREDNISONE 20 MG PO TABS: 40.0000 mg | ORAL_TABLET | Freq: Every day | ORAL | 0 refills | Status: DC

## 2024-04-21 ENCOUNTER — Telehealth: Payer: Self-pay

## 2024-04-21 ENCOUNTER — Ambulatory Visit (INDEPENDENT_AMBULATORY_CARE_PROVIDER_SITE_OTHER)

## 2024-04-21 ENCOUNTER — Ambulatory Visit: Payer: Self-pay | Admitting: Cardiology

## 2024-04-21 DIAGNOSIS — R002 Palpitations: Secondary | ICD-10-CM

## 2024-04-21 LAB — CUP PACEART REMOTE DEVICE CHECK
Date Time Interrogation Session: 20250521231017
Implantable Pulse Generator Implant Date: 20220620

## 2024-04-21 NOTE — Telephone Encounter (Signed)
 Alert received from [employer] for Alert remote transmission: Electrical reset - Route to triage. Hx of PAF, Xarelto  per EPIC Follow up as scheduled. [place], [facility]  Spoke to Jodi with [employer] tech services who advised to acknowledge electrical reset in carelink and patient does not need to come into clinic.

## 2024-04-29 ENCOUNTER — Encounter: Payer: Self-pay | Admitting: Internal Medicine

## 2024-05-03 ENCOUNTER — Ambulatory Visit (INDEPENDENT_AMBULATORY_CARE_PROVIDER_SITE_OTHER): Admitting: Physician Assistant

## 2024-05-03 NOTE — Discharge Instructions (Signed)

## 2024-05-03 NOTE — Discharge Instructions (Addendum)
 Post Procedure Spinal Discharge Instruction Sheet  You may resume a regular diet and any medications that you routinely take (including pain medications) unless otherwise noted by MD.  No driving day of procedure.  Light activity throughout the rest of the day.  Do not do any strenuous work, exercise, bending or lifting.  The day following the procedure, you can resume normal physical activity but you should refrain from exercising or physical therapy for at least three days thereafter.  You may apply ice to the injection site, 20 minutes on, 20 minutes off, as needed. Do not apply ice directly to skin.    Common Side Effects:  Headaches- take your usual medications as directed by your physician.  Increase your fluid intake.  Caffeinated beverages may be helpful.  Lie flat in bed until your headache resolves.  Restlessness or inability to sleep- you may have trouble sleeping for the next few days.  Ask your referring physician if you need any medication for sleep.  Facial flushing or redness- should subside within a few days.  Increased pain- a temporary increase in pain a day or two following your procedure is not unusual.  Take your pain medication as prescribed by your referring physician.  Leg cramps  Please contact our office at [phone] for the following symptoms: Fever greater than 100 degrees. Headaches unresolved with medication after 2-3 days. Increased swelling, pain, or redness at injection site.  **MAY RESUME TAKING YOUR XARELTO  TOMORROW**  Thank you for visiting [facility] today.

## 2024-05-04 ENCOUNTER — Inpatient Hospital Stay
Admission: RE | Admit: 2024-05-04 | Discharge: 2024-05-04 | Disposition: A | Discharge status: Home / Self Care | Source: Ambulatory Visit | Attending: Family Medicine | Admitting: Family Medicine

## 2024-05-04 NOTE — Telephone Encounter (Signed)
 Spoke to pt to follow up. Pt reports she went ahead schedule an appt for 7/2 with Dr. Ethel Henry.   Explain to pt the reason provider does not have availability is because provider is on vacation. Pt verbalized understand.

## 2024-05-05 ENCOUNTER — Ambulatory Visit
Admission: RE | Admit: 2024-05-05 | Discharge: 2024-05-05 | Disposition: A | Discharge status: Home / Self Care | Source: Ambulatory Visit | Attending: Family Medicine | Admitting: Family Medicine

## 2024-05-05 DIAGNOSIS — M5416 Radiculopathy, lumbar region: Secondary | ICD-10-CM

## 2024-05-05 MED ORDER — IOPAMIDOL (ISOVUE-M 200) INJECTION 41%
1.0000 mL | Freq: Once | INTRAMUSCULAR | Status: AC
  Administered 2024-05-05: 1 mL via EPIDURAL

## 2024-05-05 MED ORDER — METHYLPREDNISOLONE ACETATE 40 MG/ML INJ SUSP (RADIOLOG
80.0000 mg | Freq: Once | INTRAMUSCULAR | Status: AC
  Administered 2024-05-05: 80 mg via EPIDURAL

## 2024-05-11 NOTE — Progress Notes (Signed)
 [facility] Summary Report / Loop Recorder

## 2024-05-23 ENCOUNTER — Ambulatory Visit (INDEPENDENT_AMBULATORY_CARE_PROVIDER_SITE_OTHER)

## 2024-05-23 DIAGNOSIS — R002 Palpitations: Secondary | ICD-10-CM | POA: Diagnosis not present

## 2024-05-23 DIAGNOSIS — I480 Paroxysmal atrial fibrillation: Secondary | ICD-10-CM

## 2024-05-24 ENCOUNTER — Encounter

## 2024-05-24 LAB — CUP PACEART REMOTE DEVICE CHECK
Date Time Interrogation Session: 20250621231528
Implantable Pulse Generator Implant Date: 20220620

## 2024-05-25 ENCOUNTER — Ambulatory Visit: Payer: Self-pay | Admitting: Cardiology

## 2024-05-31 ENCOUNTER — Ambulatory Visit (HOSPITAL_BASED_OUTPATIENT_CLINIC_OR_DEPARTMENT_OTHER): Attending: Family Medicine | Admitting: Physical Therapy

## 2024-05-31 ENCOUNTER — Other Ambulatory Visit: Payer: Self-pay

## 2024-05-31 DIAGNOSIS — M5416 Radiculopathy, lumbar region: Secondary | ICD-10-CM | POA: Diagnosis present

## 2024-05-31 DIAGNOSIS — R29898 Other symptoms and signs involving the musculoskeletal system: Secondary | ICD-10-CM | POA: Diagnosis present

## 2024-05-31 DIAGNOSIS — M6281 Muscle weakness (generalized): Secondary | ICD-10-CM | POA: Insufficient documentation

## 2024-05-31 NOTE — Therapy (Signed)
 OUTPATIENT PHYSICAL THERAPY THORACOLUMBAR EVALUATION   Patient Name: Nichole Smith MRN: [id number] DOB:Apr 24, 1956, 68 y.o., female Today's Date: 06/01/2024  END OF SESSION:  PT End of Session - 05/31/24 1325     Visit Number 1    Number of Visits 6    Date for PT Re-Evaluation 07/12/24    Authorization Type MCR A&B    PT Start Time 1323    PT Stop Time 1417    PT Time Calculation (min) 54 min    Activity Tolerance Patient tolerated treatment well    Behavior During Therapy [facility] for tasks assessed/performed           Past Medical History:  Diagnosis Date   Arthritis    knees, back   Bursitis    hips - almost gone per pt    Carcinoid tumor of lung 2012   right found incidentally on chest xray eval for r/o vasxculitis    Coronary artery calcification 07/23/2016   Dysrhythmia    irregular occassional, paf  no current problem   Frequent UTI    Hypertension    Hypothyroidism    Infertility, female    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  Download 08/2014:  Good compliance, breakthru apnea on 10cm.  Changed to auto 5-12cm >> good control of AHI on f/u download ONO on CPAP:       Osteoarthritis    PCOS (polycystic ovarian syndrome)    PMB (postmenopausal bleeding)    SUI (stress urinary incontinence, female)    h/o   Wears glasses    Past Surgical History:  Procedure Laterality Date   ANTERIOR CERVICAL DECOMP/DISCECTOMY FUSION  12/24/2017   Procedure: Cervical five-six Cervical six-seven Anterior cervical decompression/discectomy/fusion;  Surgeon: Unice Pac, MD;  Location: [facility] OR;  Service: Neurosurgery;;   CARPAL TUNNEL RELEASE Left 12/24/2017   Procedure: LEFT CARPAL TUNNEL RELEASE;  Surgeon: Unice Pac, MD;  Location: [facility] OR;  Service: Neurosurgery;  Laterality: Left;   CATARACT EXTRACTION Bilateral    Right eye 08/24/2023   CESAREAN SECTION     x 1 - twins    COLONOSCOPY  2021   DILATATION & CURRETTAGE/HYSTEROSCOPY WITH RESECTOCOPE N/A 07/18/2013   Procedure: DILATATION & CURETTAGE/HYSTEROSCOPY WITH RESECTOCOPE;  Surgeon: Ronal Elvie Pinal, MD;  Location: [facility];  Service: Gynecology;  Laterality: N/A;  mass resection   HYSTEROSCOPY WITH D & C N/A 05/05/2023   Procedure: DILATATION AND CURETTAGE /HYSTEROSCOPY;  Surgeon: Pinal Ronal RAMAN, MD;  Location: [facility];  Service: Gynecology;  Laterality: N/A;   implantable loop recorder placement  05/20/2021   Medtronic Reveal Linq model [id number] implantable loop recorder (SN [id number])   Lung tumor removed  2012   Rt lung carcinoid   MOUTH SURGERY     pre-cancerous ulcer removed   POLYPECTOMY     SHOULDER OPEN ROTATOR CUFF REPAIR  07/20/2010   Right   WISDOM TOOTH EXTRACTION     Patient Active Problem List   Diagnosis Date Noted   Chronic anticoagulation 10/20/2023   History of colonic polyps 10/20/2023   Urinary incontinence, mixed 10/19/2023   Pelvic organ prolapse quantification stage 2 cystocele 10/19/2023   Paroxysmal atrial fibrillation (HCC) 08/07/2023   Hypercoagulable state due to paroxysmal atrial fibrillation (HCC) 08/07/2023   Polyphagia 07/28/2023   Cataract of both eyes 07/28/2023   Obesity, Starting BMI 46.48 07/28/2023   BMI 38.0-38.9,adult 05/05/2023   Right  rotator cuff tear arthropathy 04/29/2023   Recurrent UTI 03/29/2023   Postmenopausal bleeding 03/29/2023   Greater trochanteric bursitis, left 03/04/2023   SOBOE (shortness of breath on exertion) 10/14/2022   Peroneal tendon tear, right, initial encounter 10/07/2022   Avulsion fracture of lateral malleolus 09/09/2022   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   Osteopenia of both hips 08/10/2022   Dermatofibroma 07/25/2022   Digital mucinous cyst of finger of right hand 07/25/2022   Sun-damaged skin 07/25/2022   Gluteal tendonitis of right buttock 06/19/2021   Greater trochanteric bursitis of right hip 05/08/2021    Chronic idiopathic constipation 05/01/2021   Lumbar radiculopathy 04/04/2021   Healthcare maintenance 12/25/2020   Insulin  resistance 03/05/2020   Vitamin D  deficiency 08/25/2019   Prediabetes 08/25/2019   Acute pain of right knee 09/28/2018   Lichen planus 04/30/2018   Cervical stenosis of spinal canal 12/24/2017   Hyperlipidemia LDL goal <70 08/26/2017   Heart palpitations 08/26/2017   Hammertoe of right foot 01/06/2017   Coronary artery calcification 07/23/2016   Periodic limb movements of sleep 06/18/2016   Atherosclerotic plaque 06/11/2016   Piriformis syndrome of left side 10/24/2015   Loss of transverse plantar arch of left foot 09/12/2015   Thyroiditis, autoimmune 02/26/2015   Obstructive sleep apnea 07/27/2014   Midline low back pain without sciatica 06/20/2014   Polycythemia, secondary 03/22/2014   Severe obesity (BMI >= 40) (HCC) 02/22/2014   Elevated hemoglobin (HCC) 02/22/2014   Left shoulder pain 02/04/2013   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   HNP (herniated nucleus pulposus), cervical 01/22/2011   Visit for preventive health examination 01/22/2011   PAIN IN JOINT, MULTIPLE SITES 01/21/2010   Hypothyroidism 10/16/2008   Class 3 severe obesity with serious comorbidity and body mass index (BMI) of 45.0 to 49.9 in adult 01/07/2008   PALPITATIONS, RECURRENT 01/07/2008   Essential hypertension 06/25/2007   Osteoarthritis 06/25/2007     REFERRING PROVIDER: Claudene Arthea HERO, DO   REFERRING DIAG: F45.83 (ICD-10-CM) - Lumbar radiculopathy   Rationale for Evaluation and Treatment: Rehabilitation  THERAPY DIAG:  Radiculopathy, lumbar region  Muscle weakness (generalized)  Other symptoms and signs involving the musculoskeletal system  ONSET DATE: Exacerbation December 2024  SUBJECTIVE:                                                                                                                                                                                            SUBJECTIVE STATEMENT: Pt has had back pain for > 10 years.  Pt denies any specific reason for exacerbation and had increased pain in December 2024. Pt had an ESI in March which didn't help  at all.  Pt used prednisone  and felt much better.  Pt saw MD and was informed that the arthritis in her back has worsened.  Pt received an ESI in early June and felt much better.  Pt states she is doing better right now and may need 2-3 visits.    Pt states she has received multiple lists of exercises from MD's and PT's.  Pt received PT from 11/22 to 04/23 which helped.  Pt has a TENS unit which provided temporary relief.  Pt performs home exercises.  Pt walks approx 30 mins in AM approx 6 days per week.  She performs deep water exercises 2x/wk.   Pt has been working with [facility] weight loss program and has lost 55 lbs. She wants to avoid back surgery and have the highest QOL.    Pain worsens as the day progresses.  She has increased pain in the evenings.  Pt avoids physical activity due to lumbar.  When her pain is worse, she is unable to perform steps with L LE.  Pt is able to ascend steps with L LE though has difficulty descending steps.  Pt leans to the left in walking.  If pain is bad, walking is limited.  She has pain with household chores.      PERTINENT HISTORY:  -Arthritis--knees, back.  Hx of stenosis.  Lumbar anterolisthesis and mild retrolisthesis. -Bilat Hip bursitis -A-fib, HTN, PCOS -osteoporosis which has changed to osteopenia  -PSHx:   Cervical fusion 2019, Loop recorder implant 2022, R RCR 2011, L carpal tunnel release 2019, and Lung tumor removed 2012     PAIN:  NPRS:  3/10 current, 1/10 best, 9/10 worst Location:  R > L lower lumbar, bilat post hips.  Pain can travel around to ant thighs.  Aggravating factors:  standing 10-15 mins Easing factors:  lying down, sitting, bending  PRECAUTIONS: Other: lumbar anterolisthesis and mild retrolisthesis, A-fib, loop recorder implant,  cervical fusion, osteopenia   WEIGHT BEARING RESTRICTIONS: No  FALLS:  Has patient fallen in last 6 months? Yes. Number of falls 2  LIVING ENVIRONMENT: Lives with: lives with their spouse Lives in: 1 story home Stairs: no; 1 step to enter home but doesn't have to use that step to enter   OCCUPATION: retired  PLOF:  Pt is independent with mobility, ambulation, and daily activities.  She does have someone that helps her clean her home.  PATIENT GOALS: wants to make sure she is doing the right exercises and not doing things that hurt her back.  Pt wants to be able to get up from the floor.  Better balance.  Improve QOL and avoid back surgery.   OBJECTIVE:  Note: Objective measures were completed at Evaluation unless otherwise noted.  DIAGNOSTIC FINDINGS:  Lumbar MRI: FINDINGS: Segmentation: There are five lumbar type vertebral bodies. The last full intervertebral disc space is labeled L5-S1. This correlates with the prior study.   Alignment: Stable degenerative anterolisthesis of L3 and mild retrolisthesis of L1.   Vertebrae: No fracture, evidence of discitis, or bone lesion. Endplate reactive changes at L1-2 and L2-3.   Conus medullaris and cauda equina: Conus extends to the L1-2 level. Conus and cauda equina appear normal.   Paraspinal and other soft tissues: No significant paraspinal or retroperitoneal findings.   Disc levels:   T12-L1: No significant findings.   L1-2: Mild degenerative retrolisthesis of L1 with a bulging degenerated annulus, osteophytic ridging and facet disease contributing to mild spinal and bilateral lateral recess stenosis,  right greater than left. There is also a E broad-based right-sided disc osteophyte complex potentially affecting the extraforaminal right L1 nerve root. These findings appear relatively stable to slightly progressive.   L2-3: Anterolisthesis of L3 with a bulging uncovered disc and severe disc degeneration. There is also  osteophytic ridging and facet disease all contributing to moderate spinal and bilateral lateral recess stenosis. Mild foraminal stenosis bilaterally. These findings are slightly progressive since the prior study.   L3-4: Bulging uncovered disc, osteophytic ridging, facet disease and ligamentum flavum thickening contributing to mild to moderate spinal stenosis and moderate bilateral lateral recess stenosis, right greater than left. Stable mild bilateral foraminal stenosis, right greater than left. These findings appear stable.   L4-5: Bulging degenerated annulus, short pedicles, facet disease and ligamentum flavum thickening contributing to moderate spinal and bilateral lateral recess stenosis. Mild foraminal stenosis bilaterally but no significant change.   L5-S1: Significant facet disease, left greater than right but no disc protrusions, spinal or foraminal stenosis.   IMPRESSION: 1. Degenerative lumbar spondylosis with multilevel disc disease and facet disease. Multilevel multifactorial spinal, lateral recess and foraminal stenosis as discussed above at the individual levels. 2. Moderate multifactorial spinal and bilateral lateral recess stenosis at L2-3. Mild foraminal stenosis bilaterally. These findings are slightly progressive since the prior study. 3. Stable mild to moderate spinal stenosis and moderate bilateral lateral recess stenosis, right greater than left at L3-4. Stable mild bilateral foraminal stenosis, right greater than left. 4. Stable moderate spinal and bilateral lateral recess stenosis at L4-5. Mild foraminal stenosis bilaterally but no significant change. 5. Mild spinal and bilateral lateral recess stenosis, right greater than left at L1-2. There is also a broad-based right-sided disc osteophyte complex potentially affecting the extraforaminal right L1 nerve root. These findings appear relatively stable to slightly progressive.  PATIENT SURVEYS:  Modified  Oswestry:  MODIFIED OSWESTRY DISABILITY SCALE  Date: 05/31/2024 Score  Pain intensity 4 =  Pain medication provides me with little relief from pain.  2. Personal care (washing, dressing, etc.) 0 =  I can take care of myself normally without causing increased pain.  3. Lifting 3 = Pain prevents me from lifting heavy weights, but I can manage (5) I have hardly any social life because of my pain. light to medium weights if they are conveniently positioned  4. Walking 1 = Pain prevents me from walking more than 1 mile.  5. Sitting 1 =  I can only sit in my favorite chair as long as I like.  6. Standing 1 =  I can stand as long as I want but, it increases my pain.  7. Sleeping 2 =  Even when I take pain medication, I sleep less than 6 hours  8. Social Life 2 = Pain prevents me from participating in more energetic activities (eg. sports, dancing).  9. Traveling 1 =  I can travel anywhere, but it increases my pain.  10. Employment/ Homemaking 1 = My normal homemaking/job activities increase my pain, but I can still perform all that is required of me  Total 16/50= 32%   Interpretation of scores: Score Category Description  0-20% Minimal Disability The patient can cope with most living activities. Usually no treatment is indicated apart from advice on lifting, sitting and exercise  21-40% Moderate Disability The patient experiences more pain and difficulty with sitting, lifting and standing. Travel and social life are more difficult and they may be disabled from work. Personal care, sexual activity and sleeping are not grossly  affected, and the patient can usually be managed by conservative means  41-60% Severe Disability Pain remains the main problem in this group, but activities of daily living are affected. These patients require a detailed investigation  61-80% Crippled Back pain impinges on all aspects of the patient's life. Positive intervention is required  81-100% Bed-bound  These patients are  either bed-bound or exaggerating their symptoms  Bluford FORBES Zoe DELENA Karon DELENA, et al. Surgery versus conservative management of stable thoracolumbar fracture: the PRESTO feasibility RCT. [place]): [employer]; 2021 Nov. [facility] Technology Assessment, No. 25.62.) Appendix 3, Oswestry Disability Index category descriptors. Available from: FindJewel[employer]cz  Minimally Clinically Important Difference (MCID) = 12.8%  COGNITION: Overall cognitive status: Within functional limits for tasks assessed     SENSATION: Check next visit  MUSCLE LENGTH: Hamstrings: overall good flexibility in HS with minimal tightness on R.     PALPATION: Pt had tenderness and tightness in bilat glutes.  LUMBAR ROM:   AROM eval  Flexion WFL  Extension   Right lateral flexion 90%  Left lateral flexion 90% with pain  Right rotation WFL  Left rotation 80%   (Blank rows = not tested)   LOWER EXTREMITY MMT:    MMT Right eval Left eval  Hip flexion Tolerated slight resistance 4/5  Hip extension    Hip abduction 3/5 4+/5  Hip adduction    Hip internal rotation    Hip external rotation 4-/5 5/5  Knee flexion    Knee extension 5/5 5/5  Ankle dorsiflexion    Ankle plantarflexion    Ankle inversion    Ankle eversion     (Blank rows = not tested)  LUMBAR SPECIAL TESTS:  SLR test:  negative bilat   GAIT: Level of assistance: Complete Independence Comments: Pt minimally leans to the left.  Pt ambulates without an AD.   TREATMENT:                                                                              PT educated pt concerning TrA contraction and the importance/role of the TrA.     PATIENT EDUCATION:  Education details: dx, objective findings, relevant anatomy, POC, rationale of interventions, and what to expect next Rx.  PT answered pt's questions.   Person educated: Patient Education method: Medical illustrator Education  comprehension: verbalized understanding and needs further education  HOME EXERCISE PROGRAM: Will give next visit  ASSESSMENT:  CLINICAL IMPRESSION: Patient is a 68 y.o. female with a dx of lumbar radiculopathy.  She has a hx of chronic lumbar pain.  Pt has received multiple injections in the past.  Pt received an ESI in early June and reports feeling much better.  Pt has received PT in the past which helped.  Pt has limited lumbar AROM and muscle weakness in bilat LE's.  Pt avoids physical activity due to lumbar and her pain worsens as the day progresses.  She has increased pain in the evenings.  When her pain is worse, she is unable to perform steps with L LE and ambulation is limited.  She has pain with household chores.  Pt should benefit from skilled PT services to address impairments, establish independence  with HEP, and to improve overall function.      OBJECTIVE IMPAIRMENTS: Abnormal gait, decreased activity tolerance, decreased endurance, decreased mobility, difficulty walking, decreased ROM, decreased strength, hypomobility, increased fascial restrictions, and pain.   ACTIVITY LIMITATIONS: lifting, standing, stairs, and locomotion level  PARTICIPATION LIMITATIONS: cleaning, shopping, and community activity  PERSONAL FACTORS: Time since onset of injury/illness/exacerbation and 3+ comorbidities: arthritis, bilat hip bursitis, cervical fusion, A-fib are also affecting patient's functional outcome.   REHAB POTENTIAL: Good  CLINICAL DECISION MAKING: Stable/uncomplicated  EVALUATION COMPLEXITY: Low   GOALS:   SHORT TERM GOALS: Target date:  06/21/2024   Pt will perform home exercises without adverse effects for improved pain, strength, ROM, and function. Baseline: Goal status: INITIAL  2.  Pt will report improved tolerance with daily activities and daily mobility including in the afternoon.   Baseline:  Goal status: INITIAL  3.  Pt will progress with core exercises without  adverse effects for improved core stabilization for improved ease and reduced pain with daily activities and household chores.  Baseline:  Goal status: INITIAL    LONG TERM GOALS: Target date:  07/12/2024   Pt will be independent and compliant with HEP for improved pain, ROM, strength, and function.  Baseline:  Goal status: INITIAL  2.  Pt will demo improved R LE strength to = L LE strength for improved tolerance with and performance of daily activities and functional mobility.  Baseline:  Goal status: INITIAL  3.  Pt will report she is able to ambulate her normal community distance without significant pain.   Baseline:  Goal status: INITIAL  4.  Pt will demo improved lumbar AROM to be [facility] for bilat SB'ing and L rotation for improved stiffness and daily mobility.   Baseline:  Goal status: INITIAL    PLAN:  PT FREQUENCY: 1x/week  PT DURATION: 6 weeks  PLANNED INTERVENTIONS: [id number]- PT Re-evaluation, 97750- Physical Performance Testing, 97110-Therapeutic exercises, 97530- Therapeutic activity, [id number]- Neuromuscular re-education, 97535- Self Care, [id number]- Manual therapy, [id number]- Gait training, [phone]- Aquatic Therapy, [phone]- Ultrasound, Patient/Family education, Stair training, Taping, Cryotherapy, and Moist heat.  PLAN FOR NEXT SESSION: STM to glute, TrA contraction.  Establish HEP.  Check sensation to LT in bilat LE dermatomes.   Leigh Minerva III PT, DPT 06/01/24 10:22 PM

## 2024-06-01 ENCOUNTER — Encounter: Payer: Self-pay | Admitting: Internal Medicine

## 2024-06-01 ENCOUNTER — Ambulatory Visit: Admitting: Internal Medicine

## 2024-06-01 ENCOUNTER — Encounter (HOSPITAL_BASED_OUTPATIENT_CLINIC_OR_DEPARTMENT_OTHER): Payer: Self-pay | Admitting: Physical Therapy

## 2024-06-01 VITALS — BP 110/70 | HR 66 | Temp 97.9°F | Ht 59.0 in | Wt 192.0 lb

## 2024-06-01 DIAGNOSIS — Z79899 Other long term (current) drug therapy: Secondary | ICD-10-CM | POA: Diagnosis not present

## 2024-06-01 DIAGNOSIS — Z7901 Long term (current) use of anticoagulants: Secondary | ICD-10-CM

## 2024-06-01 DIAGNOSIS — R52 Pain, unspecified: Secondary | ICD-10-CM

## 2024-06-01 DIAGNOSIS — M79675 Pain in left toe(s): Secondary | ICD-10-CM

## 2024-06-01 DIAGNOSIS — M5416 Radiculopathy, lumbar region: Secondary | ICD-10-CM

## 2024-06-01 NOTE — Patient Instructions (Signed)
 Agree with med review  risk benefits  of each . Hopefully if can get of [facility]  more  options available  Disc with Dr Claudene  plus neg of gabapentin     lyrica. Agree cs would only be used for rescue in most people.cuase of neg risk  Physical modalities are usually the best as you are doin g.

## 2024-06-01 NOTE — Progress Notes (Signed)
 Chief Complaint  Patient presents with   Medical Management of Chronic Issues    Pt reports she was having bad back pain and foot pain few months ago. Was seen with [facility]. Sx resolved but would like provider opinion on medication she is on. Also for provider to take a look at her both of her foot.    Foot Pain    Went to see podiatrist got injection: [facility] doctor    HPI: Nichole Smith 68 y.o. come in for pcp fu  Has multipl specialists but still problem  and hard to get appt  Sees   derm  yearly , gyne,   Back , on goin problem getting  injections for spinal stenosis  sees ns every 2-3 years ( since had c spine surgery Dr Claudene dunker al  on gabapentin  asks about   risk benefit of pain other intervnetions  she cannot take  nsaids as is on HCOA but may be able to go off at some point .  Prednisone  helps a lot but negative is a big problem  Is on gabapentin   400 bid  not certain hwo well this helps her  Sees weight  .  Management   50 # loss but yplateau but doing ok .SABRA   Foot left toed gets swollen and red and  uncertain cause  sometimes limiting  not gout or injur with neg x ray  her podiatrist is gon ? Should she go back?    ROS: See pertinent positives and negatives per HPI.  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  Download 08/2014:  Good compliance, breakthru apnea on 10cm.  Changed to auto 5-12cm >> good control of AHI on f/u download ONO on CPAP:       Osteoarthritis    PCOS (polycystic ovarian  syndrome)    PMB (postmenopausal bleeding)    SUI (stress urinary incontinence, female)    h/o   Wears glasses     Family History  Problem Relation Age of Onset   Stroke Mother        died age 105   Hypertension Mother    Sudden death Mother    Alcoholism Mother    Coronary artery disease Father        died age 13   Hypertension Father    Liver disease Father    Alcohol abuse Father    Obesity Father    Parkinson's disease Brother    Diabetes Brother    Hypertension Brother    Hypertension Maternal Uncle    Colon cancer Neg Hx    Rectal cancer Neg Hx    Stomach cancer Neg Hx    Colon polyps Neg Hx    Esophageal cancer Neg Hx     Social History   Socioeconomic History   Marital status: Married    Spouse name: Cheryl   Number of children: 2  Years of education: Not on file   Highest education level: Bachelor's degree (e.g., BA, AB, BS)  Occupational History   Occupation: retired    Associate Professor: Museum/gallery exhibitions officer  Tobacco Use   Smoking status: Never   Smokeless tobacco: Never   Tobacco comments:    Never smoked 08/07/23  Vaping Use   Vaping status: Never Used  Substance and Sexual Activity   Alcohol use: Yes    Comment: occ   Drug use: No   Sexual activity: Not Currently    Partners: Male    Birth control/protection: Post-menopausal  Other Topics Concern   Not on file  Social History Narrative   hhof 2    2 Children at college and  beyond      [name]    Married    Retired age 72 2015   Recently moved taking care of grandchildren during the week.   Social Drivers of Corporate investment banker Strain: Low Risk  (05/28/2024)   Overall Financial Resource Strain (CARDIA)    Difficulty of Paying Living Expenses: Not hard at all  Food Insecurity: No Food Insecurity (05/28/2024)   Hunger Vital Sign    Worried About Running Out of Food in the Last Year: Never true    Ran Out of Food in the Last Year: Never true  Transportation Needs: No  Transportation Needs (05/28/2024)   PRAPARE - Administrator, Civil Service (Medical): No    Lack of Transportation (Non-Medical): No  Physical Activity: Insufficiently Active (05/28/2024)   Exercise Vital Sign    Days of Exercise per Week: 4 days    Minutes of Exercise per Session: 30 min  Stress: No Stress Concern Present (05/28/2024)   [employer] of Occupational Health - Occupational Stress Questionnaire    Feeling of Stress: Only a little  Social Connections: Socially Integrated (05/28/2024)   Social Connection and Isolation Panel    Frequency of Communication with Friends and Family: Twice a week    Frequency of Social Gatherings with Friends and Family: Twice a week    Attends Religious Services: More than 4 times per year    Active Member of [employer] or Organizations: Yes    Attends Engineer, structural: More than 4 times per year    Marital Status: Married    Outpatient Medications Prior to Visit  Medication Sig Dispense Refill   alendronate  (FOSAMAX ) 70 MG tablet Take 1 tablet (70 mg total) by mouth every 7 (seven) days. Take first thing in am with 6 oz. Water.  Be upright after taking.  Eat nothing for one hour. 13 tablet 3   atorvastatin  (LIPITOR) 40 MG tablet Take 1 tablet (40 mg total) by mouth daily. 90 tablet 3   CALCIUM  PO Take 1 tablet by mouth at bedtime.      chlorthalidone  (HYGROTON ) 25 MG tablet TAKE 1 TABLET DAILY 90 tablet 3   diltiazem  (CARDIZEM  CD) 180 MG 24 hr capsule TAKE 1 CAPSULE DAILY 90 capsule 3   diltiazem  (CARDIZEM ) 30 MG tablet 1 tablet every 6 hours for palpitations 60 tablet 3   Doxepin  HCl 3 MG TABS TAKE 1 TABLET (3 MG TOTAL) BY MOUTH AT BEDTIME AS NEEDED. 30 tablet 2   estradiol  (ESTRACE ) 0.1 MG/GM vaginal cream INSERT 1 GM VAGINALLY TWICE WEEKLY 42.5 g 3   gabapentin  (NEURONTIN ) 400 MG capsule TAKE 1 CAPSULE TWICE A DAY 180 capsule 3   GEMTESA  75 MG TABS Take 1 tablet (  75 mg total) by mouth daily as needed. 90 tablet 3    metoprolol  succinate (TOPROL -XL) 50 MG 24 hr tablet TAKE 1 TABLET DAILY 90 tablet 2   Multiple Vitamins-Minerals (PRESERVISION AREDS 2 PO) Take 1 tablet by mouth 2 (two) times daily.     predniSONE  (DELTASONE ) 20 MG tablet Take 2 tablets (40 mg total) by mouth daily with breakfast. 14 tablet 0   Probiotic Product (PROBIOTIC PO) Take by mouth.     SYNTHROID  125 MCG tablet Take 1 tablet (125 mcg total) by mouth daily before breakfast. Dosage change 90 tablet 3   XARELTO  20 MG TABS tablet TAKE 1 TABLET DAILY WITH SUPPER 90 tablet 3   ZEPBOUND  15 MG/0.5ML Pen Inject 15 mg into the skin once a week. 6 mL 0   nitrofurantoin , macrocrystal-monohydrate, (MACROBID ) 100 MG capsule Take 1 capsule (100 mg total) by mouth 2 (two) times daily. 10 capsule 0   phenazopyridine  (PYRIDIUM ) 95 MG tablet Take 1 tablet (95 mg total) by mouth 3 (three) times daily as needed. 30 tablet 1   No facility-administered medications prior to visit.     EXAM:  BP 110/70 (BP Location: Left Arm, Patient Position: Sitting, Cuff Size: Large)   Pulse 66   Temp 97.9 F (36.6 C) (Oral)   Ht 4' 11 (1.499 m)   Wt 192 lb (87.1 kg)   LMP 02/12/2011   SpO2 96%   BMI 38.78 kg/m   Body mass index is 38.78 kg/m.  GENERAL: vitals reviewed and listed above, alert, oriented, appears well hydrated and in no acute distress HEENT: atraumatic, conjunctiva  clear, no obvious abnormalities on inspection of external nose and ears NECK: no obvious masses on inspection palpation  CV: HRRR, no clubbing cyanosis or  peripheral edema nl cap refill  MS: moves all extremities toes left midd some swelling and pink pip but no acute findings   PSYCH: pleasant and cooperative, no obvious depression or anxiety  BP Readings from Last 3 Encounters:  06/01/24 110/70  05/05/24 (!) 155/60  04/04/24 112/82    ASSESSMENT AND PLAN:  Discussed the following assessment and plan:  Lumbar radiculopathy  Medication management  Chronic  anticoagulation  Pain - neuropathic and ms  Obesity, Starting BMI 46.48 - now down to bmi 36  Pain of toe of left foot - with swelling  looks like arthritis  topical therapy ok Basically  pain control limited but se potential and anticoagulation  Disc gabapentin  vs lyrica  se of weight and effective ness  Low dose Cymbalta  daily and not  acceptable and I agree  prob not that helpful. Rescue  cs  some will use  Consider rehab physical medication if getting stuck  Make yearly check appt for November when due and can do lab pre visit if call ahead and I can place appropriate orders   I agree with frustration of barriers  and limitations of new  appt  based system  on a number of factors .She was given appt 6 weeks out  for pcp and I never got notified that she wanted to be seen in a timely manner and I had no chance to work in to the schedule .earlier  Advise work around and ok to message my chart and also ask for earlier appts if feels needed .  -Patient advised to return or notify health care team  if  new concerns arise.  Patient Instructions  Agree with med review  risk benefits  of  each . Hopefully if can get of HCOA  more  options available  Disc with Dr Claudene  plus neg of gabapentin     lyrica. Agree cs would only be used for rescue in most people.cuase of neg risk  Physical modalities are usually the best as you are doin g.    Sheralee Qazi K. Hogan Hoobler M.D.

## 2024-06-02 ENCOUNTER — Encounter (INDEPENDENT_AMBULATORY_CARE_PROVIDER_SITE_OTHER): Payer: Self-pay | Admitting: Family Medicine

## 2024-06-02 ENCOUNTER — Ambulatory Visit (INDEPENDENT_AMBULATORY_CARE_PROVIDER_SITE_OTHER): Admitting: Family Medicine

## 2024-06-02 VITALS — BP 110/58 | HR 64 | Temp 97.8°F | Ht 59.0 in | Wt 183.0 lb

## 2024-06-02 DIAGNOSIS — E669 Obesity, unspecified: Secondary | ICD-10-CM

## 2024-06-02 DIAGNOSIS — I2510 Atherosclerotic heart disease of native coronary artery without angina pectoris: Secondary | ICD-10-CM

## 2024-06-02 DIAGNOSIS — E7841 Elevated Lipoprotein(a): Secondary | ICD-10-CM

## 2024-06-02 DIAGNOSIS — Z6836 Body mass index (BMI) 36.0-36.9, adult: Secondary | ICD-10-CM | POA: Diagnosis not present

## 2024-06-02 MED ORDER — ZEPBOUND 15 MG/0.5ML ~~LOC~~ SOAJ
15.0000 mg | SUBCUTANEOUS | 0 refills | Status: DC
Start: 2024-06-02 — End: 2024-08-17

## 2024-06-02 NOTE — Progress Notes (Signed)
 SUBJECTIVE:  Chief Complaint: Obesity  Interim History: Since 2023 patient has been seeing other providers here.  She mentions she has dealt with a few other body issues that have kept her from being able to full commit to meal plan.  She got an epidural injection recently and that helped with her back pain.  She is on gabapentin  for pain as she cannot take NSAIDs due to being on a blood thinner.  She is often ending up around 1400 calories and getting about 90 grams of protein.  She does use shakes or bars to help get to total protein in.   Nichole Smith is here to discuss her progress with her obesity treatment plan. She is on the keeping a food journal and adhering to recommended goals of 1300 calories and 90 grams of protein and states she is following her eating plan approximately 60 % of the time. She states she is exercising 40 minutes 7 times per week- 2 hours a week in the pool, she walks about 30 minutes daily and does squats daily.   OBJECTIVE: Visit Diagnoses: Problem List Items Addressed This Visit       Cardiovascular and Mediastinum   Coronary artery calcification   Patient doing well on Zepbound  15mg  weekly.  She needs a refill today.  No nausea, vomiting, abdominal pain or constipation mentioned.        Relevant Medications   ZEPBOUND  15 MG/0.5ML Pen     Other   Obesity, Starting BMI 46.48   Relevant Medications   ZEPBOUND  15 MG/0.5ML Pen   Other Visit Diagnoses       Elevated lipoprotein(a)       Relevant Medications   ZEPBOUND  15 MG/0.5ML Pen       No data recorded       06/02/2024   11:00 AM 06/01/2024    9:29 AM 05/05/2024   10:10 AM  Vitals with BMI  Height 4' 11 4' 11   Weight 183 lbs 192 lbs   BMI 36.94 38.76   Systolic 110 110 844  Diastolic 58 70 60  Pulse 64 66 61      ASSESSMENT AND PLAN: Assessment & Plan Coronary artery calcification Patient doing well on Zepbound  15mg  weekly.  She needs a refill today.  No nausea, vomiting, abdominal  pain or constipation mentioned.   Elevated lipoprotein(a) Patient on tirzepatide  for cardio protection hopes.  Refill sent to pharmacy today. Obesity, Starting BMI 46.48 Anthropometric Measurements Height: 4' 11 (1.499 m) Weight: 183 lb (83 kg) BMI (Calculated): 36.94 Weight at Last Visit: 186 lb Weight Lost Since Last Visit: 3 Weight Gained Since Last Visit: 0 Starting Weight: 238 lb Total Weight Loss (lbs): 55 lb (24.9 kg) Body Composition  Body Fat %: 46.6 % Fat Mass (lbs): 85.4 lbs Muscle Mass (lbs): 92.8 lbs Total Body Water (lbs): 67.6 lbs Visceral Fat Rating : 15 Other Clinical Data Today's Visit #: 78 Starting Date: 01/12/19 Comments: 1300/90  BMI 36.0-36.9,adult Current BMI 36.4    Diet: Nichole Smith is currently in the action stage of change. As such, her goal is to continue with weight loss efforts and has agreed to keeping a food journal and adhering to recommended goals of 1300 calories and 90 or more grams of protein daily.   Exercise:  Older adults should determine their level of effort for physical activity relative to their level of fitness.  Behavior Modification:  We discussed the following Behavioral Modification Strategies today: increasing lean protein intake, decreasing  simple carbohydrates, increasing vegetables, meal planning and cooking strategies, keeping healthy foods in the home, and keep a strict food journal.   No follow-ups on file.   She was informed of the importance of frequent follow up visits to maximize her success with intensive lifestyle modifications for her multiple health conditions.  Attestation Statements:   Reviewed by clinician on day of visit: allergies, medications, problem list, medical history, surgical history, family history, social history, and previous encounter notes.   Nichole Cho, MD

## 2024-06-08 NOTE — Progress Notes (Signed)
 [facility] Summary Report / Loop Recorder

## 2024-06-12 NOTE — Assessment & Plan Note (Signed)
 Anthropometric Measurements Height: 4' 11 (1.499 m) Weight: 183 lb (83 kg) BMI (Calculated): 36.94 Weight at Last Visit: 186 lb Weight Lost Since Last Visit: 3 Weight Gained Since Last Visit: 0 Starting Weight: 238 lb Total Weight Loss (lbs): 55 lb (24.9 kg) Body Composition  Body Fat %: 46.6 % Fat Mass (lbs): 85.4 lbs Muscle Mass (lbs): 92.8 lbs Total Body Water (lbs): 67.6 lbs Visceral Fat Rating : 15 Other Clinical Data Today's Visit #: 68 Starting Date: 01/12/19 Comments: 1300/90

## 2024-06-12 NOTE — Assessment & Plan Note (Addendum)
 Patient doing well on Zepbound  15mg  weekly.  She needs a refill today.  No nausea, vomiting, abdominal pain or constipation mentioned.

## 2024-06-16 ENCOUNTER — Ambulatory Visit: Admitting: Family Medicine

## 2024-06-21 ENCOUNTER — Ambulatory Visit (HOSPITAL_BASED_OUTPATIENT_CLINIC_OR_DEPARTMENT_OTHER): Payer: Self-pay | Admitting: Physical Therapy

## 2024-06-21 ENCOUNTER — Encounter (HOSPITAL_BASED_OUTPATIENT_CLINIC_OR_DEPARTMENT_OTHER): Payer: Self-pay | Admitting: Physical Therapy

## 2024-06-21 DIAGNOSIS — R29898 Other symptoms and signs involving the musculoskeletal system: Secondary | ICD-10-CM

## 2024-06-21 DIAGNOSIS — M5416 Radiculopathy, lumbar region: Secondary | ICD-10-CM | POA: Diagnosis not present

## 2024-06-21 DIAGNOSIS — M6281 Muscle weakness (generalized): Secondary | ICD-10-CM

## 2024-06-21 NOTE — Therapy (Signed)
 OUTPATIENT PHYSICAL THERAPY THORACOLUMBAR TREATMENT   Patient Name: Nichole Smith MRN: [id number] DOB:[birth year], 68 y.o., female Today's Date: 06/22/2024  END OF SESSION: PT End of Session - 06/21/2024    Visit Number 2   Number of Visits 6   Date for PT Re-Evaluation 07/12/2024   Authorization Type MCR A&B   PT Start Time  1540   PT Stop Time 1625   PT Time Calculation (min) 45 min   Activity Tolerance Patient tolerated treatment well WFL for tasks assessed/performed   Behavior During Therapy       Past Medical History:  Diagnosis Date   Arthritis    knees, back   Bursitis    hips - almost gone per pt    Carcinoid tumor of lung 2012   right found incidentally on chest xray eval for r/o vasxculitis    Coronary artery calcification 07/23/2016   Dysrhythmia    irregular occassional, paf  no current problem   Frequent UTI    Hypertension    Hypothyroidism    Infertility, female    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  Download 08/2014:  Good compliance, breakthru apnea on 10cm.  Changed to auto 5-12cm >> good control of AHI on f/u download ONO on CPAP:       Osteoarthritis    PCOS (polycystic ovarian syndrome)    PMB (postmenopausal bleeding)    SUI (stress urinary incontinence, female)    h/o   Wears glasses    Past Surgical History:  Procedure Laterality Date   ANTERIOR CERVICAL DECOMP/DISCECTOMY FUSION  12/24/2017   Procedure: Cervical five-six Cervical six-seven Anterior cervical decompression/discectomy/fusion;  Surgeon: Unice Pac, MD;  Location: [facility] OR;  Service: Neurosurgery;;   CARPAL TUNNEL RELEASE Left 12/24/2017   Procedure: LEFT CARPAL TUNNEL RELEASE;  Surgeon: Unice Pac, MD;  Location: [facility] OR;  Service: Neurosurgery;  Laterality: Left;   CATARACT EXTRACTION Bilateral    Right eye 08/24/2023   CESAREAN SECTION     x 1 - twins   COLONOSCOPY  2021    DILATATION & CURRETTAGE/HYSTEROSCOPY WITH RESECTOCOPE N/A 07/18/2013   Procedure: DILATATION & CURETTAGE/HYSTEROSCOPY WITH RESECTOCOPE;  Surgeon: Ronal Elvie Pinal, MD;  Location: [facility];  Service: Gynecology;  Laterality: N/A;  mass resection   HYSTEROSCOPY WITH D & C N/A 05/05/2023   Procedure: DILATATION AND CURETTAGE /HYSTEROSCOPY;  Surgeon: Pinal Ronal RAMAN, MD;  Location: [facility];  Service: Gynecology;  Laterality: N/A;   implantable loop recorder placement  05/20/2021   Medtronic Reveal Linq model [id number] implantable loop recorder (SN [id number])   Lung tumor removed  2012   Rt lung carcinoid   MOUTH SURGERY     pre-cancerous ulcer removed   POLYPECTOMY     SHOULDER OPEN ROTATOR CUFF REPAIR  07/20/2010   Right   WISDOM TOOTH EXTRACTION     Patient Active Problem List   Diagnosis Date Noted   Chronic anticoagulation 10/20/2023   History of colonic polyps 10/20/2023   Urinary incontinence, mixed 10/19/2023   Pelvic organ prolapse quantification stage 2 cystocele 10/19/2023   Paroxysmal atrial fibrillation (HCC) 08/07/2023   Hypercoagulable state due to paroxysmal atrial fibrillation (HCC) 08/07/2023   Polyphagia 07/28/2023   Cataract of both eyes 07/28/2023   Obesity, Starting BMI 46.48 07/28/2023   BMI 38.0-38.9,adult 05/05/2023   Right rotator cuff tear arthropathy 04/29/2023   Recurrent UTI 03/29/2023   Postmenopausal bleeding  03/29/2023   Greater trochanteric bursitis, left 03/04/2023   SOBOE (shortness of breath on exertion) 10/14/2022   Peroneal tendon tear, right, initial encounter 10/07/2022   Avulsion fracture of lateral malleolus 09/09/2022   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   Osteopenia of both hips 08/10/2022   Dermatofibroma 07/25/2022   Digital mucinous cyst of finger of right hand 07/25/2022   Sun-damaged skin 07/25/2022   Gluteal tendonitis of right buttock 06/19/2021   Greater trochanteric bursitis of right hip 05/08/2021   Chronic idiopathic  constipation 05/01/2021   Lumbar radiculopathy 04/04/2021   Healthcare maintenance 12/25/2020   Insulin  resistance 03/05/2020   Vitamin D  deficiency 08/25/2019   Prediabetes 08/25/2019   Acute pain of right knee 09/28/2018   Lichen planus 04/30/2018   Cervical stenosis of spinal canal 12/24/2017   Hyperlipidemia LDL goal <70 08/26/2017   Heart palpitations 08/26/2017   Hammertoe of right foot 01/06/2017   Coronary artery calcification 07/23/2016   Periodic limb movements of sleep 06/18/2016   Atherosclerotic plaque 06/11/2016   Piriformis syndrome of left side 10/24/2015   Loss of transverse plantar arch of left foot 09/12/2015   Thyroiditis, autoimmune 02/26/2015   Obstructive sleep apnea 07/27/2014   Midline low back pain without sciatica 06/20/2014   Polycythemia, secondary 03/22/2014   Severe obesity (BMI >= 40) (HCC) 02/22/2014   Elevated hemoglobin (HCC) 02/22/2014   Left shoulder pain 02/04/2013   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   HNP (herniated nucleus pulposus), cervical 01/22/2011   Visit for preventive health examination 01/22/2011   PAIN IN JOINT, MULTIPLE SITES 01/21/2010   Hypothyroidism 10/16/2008   Class 3 severe obesity with serious comorbidity and body mass index (BMI) of 45.0 to 49.9 in adult 01/07/2008   PALPITATIONS, RECURRENT 01/07/2008   Essential hypertension 06/25/2007   Osteoarthritis 06/25/2007     REFERRING PROVIDER: Claudene Arthea HERO, DO   REFERRING DIAG: F45.83 (ICD-10-CM) - Lumbar radiculopathy   Rationale for Evaluation and Treatment: Rehabilitation  THERAPY DIAG:  Radiculopathy, lumbar region  Muscle weakness (generalized)  Other symptoms and signs involving the musculoskeletal system  ONSET DATE: Exacerbation December 2024  SUBJECTIVE:                                                                                                                                                                                           SUBJECTIVE  STATEMENT:  Pain worsens as the day progresses.  She has increased pain in the evenings.  Pt avoids physical activity due to lumbar.  When her pain is worse, she is unable to perform steps with L LE.   If pain is bad, walking is limited.  She has pain with household chores.    Pt denies any adverse effects after prior Rx.  Pt states she was having a stabbing pain yesterday.  Pt states she likes the bridge.  When she hurts, she does bridges which helps.    PERTINENT HISTORY:  -Arthritis--knees, back.  Hx of stenosis.  Lumbar anterolisthesis and mild retrolisthesis. -Bilat Hip bursitis -A-fib, HTN, PCOS -osteoporosis which has changed to osteopenia  -PSHx:   Cervical fusion 2019, Loop recorder implant 2022, R RCR 2011, L carpal tunnel release 2019, and Lung tumor removed 2012   PAIN:  NPRS:  3/10 current, 1/10 best, 9/10 worst Location:  central and R sided lower lumbar which travels around to R ant thigh  Aggravating factors:  standing 10-15 mins Easing factors:  lying down, sitting, bending  PRECAUTIONS: Other: lumbar anterolisthesis and mild retrolisthesis, A-fib, loop recorder implant, cervical fusion, osteopenia   WEIGHT BEARING RESTRICTIONS: No  FALLS:  Has patient fallen in last 6 months? Yes. Number of falls 2  LIVING ENVIRONMENT: Lives with: lives with their spouse Lives in: 1 story home Stairs: no; 1 step to enter home but doesn't have to use that step to enter   OCCUPATION: retired  PLOF:  Pt is independent with mobility, ambulation, and daily activities.  She does have someone that helps her clean her home.  PATIENT GOALS: wants to make sure she is doing the right exercises and not doing things that hurt her back.  Pt wants to be able to get up from the floor.  Better balance.  Improve QOL and avoid back surgery.   OBJECTIVE:  Note: Objective measures were completed at Evaluation unless otherwise noted.  DIAGNOSTIC FINDINGS:  Lumbar  MRI: FINDINGS: Segmentation: There are five lumbar type vertebral bodies. The last full intervertebral disc space is labeled L5-S1. This correlates with the prior study.   Alignment: Stable degenerative anterolisthesis of L3 and mild retrolisthesis of L1.   Vertebrae: No fracture, evidence of discitis, or bone lesion. Endplate reactive changes at L1-2 and L2-3.   Conus medullaris and cauda equina: Conus extends to the L1-2 level. Conus and cauda equina appear normal.   Paraspinal and other soft tissues: No significant paraspinal or retroperitoneal findings.   Disc levels:   T12-L1: No significant findings.   L1-2: Mild degenerative retrolisthesis of L1 with a bulging degenerated annulus, osteophytic ridging and facet disease contributing to mild spinal and bilateral lateral recess stenosis, right greater than left. There is also a E broad-based right-sided disc osteophyte complex potentially affecting the extraforaminal right L1 nerve root. These findings appear relatively stable to slightly progressive.   L2-3: Anterolisthesis of L3 with a bulging uncovered disc and severe disc degeneration. There is also osteophytic ridging and facet disease all contributing to moderate spinal and bilateral lateral recess stenosis. Mild foraminal stenosis bilaterally. These findings are slightly progressive since the prior study.   L3-4: Bulging uncovered disc, osteophytic ridging, facet disease and ligamentum flavum thickening contributing to mild to moderate spinal stenosis and moderate bilateral lateral recess stenosis, right greater than left. Stable mild bilateral foraminal stenosis, right greater than left. These findings appear stable.   L4-5: Bulging degenerated annulus, short pedicles, facet disease and ligamentum flavum thickening contributing to moderate spinal and bilateral lateral recess stenosis. Mild foraminal stenosis bilaterally but no significant change.   L5-S1:  Significant facet disease, left greater than right but no disc protrusions, spinal or foraminal stenosis.   IMPRESSION: 1. Degenerative lumbar spondylosis with multilevel disc disease  and facet disease. Multilevel multifactorial spinal, lateral recess and foraminal stenosis as discussed above at the individual levels. 2. Moderate multifactorial spinal and bilateral lateral recess stenosis at L2-3. Mild foraminal stenosis bilaterally. These findings are slightly progressive since the prior study. 3. Stable mild to moderate spinal stenosis and moderate bilateral lateral recess stenosis, right greater than left at L3-4. Stable mild bilateral foraminal stenosis, right greater than left. 4. Stable moderate spinal and bilateral lateral recess stenosis at L4-5. Mild foraminal stenosis bilaterally but no significant change. 5. Mild spinal and bilateral lateral recess stenosis, right greater than left at L1-2. There is also a broad-based right-sided disc osteophyte complex potentially affecting the extraforaminal right L1 nerve root. These findings appear relatively stable to slightly progressive.  PATIENT SURVEYS:  Modified Oswestry:  MODIFIED OSWESTRY DISABILITY SCALE  Date: 05/31/2024 Score  Pain intensity 4 =  Pain medication provides me with little relief from pain.  2. Personal care (washing, dressing, etc.) 0 =  I can take care of myself normally without causing increased pain.  3. Lifting 3 = Pain prevents me from lifting heavy weights, but I can manage (5) I have hardly any social life because of my pain. light to medium weights if they are conveniently positioned  4. Walking 1 = Pain prevents me from walking more than 1 mile.  5. Sitting 1 =  I can only sit in my favorite chair as long as I like.  6. Standing 1 =  I can stand as long as I want but, it increases my pain.  7. Sleeping 2 =  Even when I take pain medication, I sleep less than 6 hours  8. Social Life 2 = Pain prevents me  from participating in more energetic activities (eg. sports, dancing).  9. Traveling 1 =  I can travel anywhere, but it increases my pain.  10. Employment/ Homemaking 1 = My normal homemaking/job activities increase my pain, but I can still perform all that is required of me  Total 16/50= 32%   Interpretation of scores: Score Category Description  0-20% Minimal Disability The patient can cope with most living activities. Usually no treatment is indicated apart from advice on lifting, sitting and exercise  21-40% Moderate Disability The patient experiences more pain and difficulty with sitting, lifting and standing. Travel and social life are more difficult and they may be disabled from work. Personal care, sexual activity and sleeping are not grossly affected, and the patient can usually be managed by conservative means  41-60% Severe Disability Pain remains the main problem in this group, but activities of daily living are affected. These patients require a detailed investigation  61-80% Crippled Back pain impinges on all aspects of the patient's life. Positive intervention is required  81-100% Bed-bound  These patients are either bed-bound or exaggerating their symptoms  Bluford FORBES Zoe DELENA Karon DELENA, et al. Surgery versus conservative management of stable thoracolumbar fracture: the PRESTO feasibility RCT. [place]): [employer]; 2021 Nov. [facility] [place] Technology Assessment, No. 25.62.) Appendix 3, Oswestry Disability Index category descriptors. Available from: FindJewel[employer].cz  Minimally Clinically Important Difference (MCID) = 12.8%  COGNITION: Overall cognitive status: Within functional limits for tasks assessed       MUSCLE LENGTH: Hamstrings: overall good flexibility in HS with minimal tightness on R.     PALPATION: Pt had tenderness and tightness in bilat glutes.  LUMBAR ROM:   AROM eval  Flexion WFL  Extension   Right lateral  flexion 90%  Left  lateral flexion 90% with pain  Right rotation WFL  Left rotation 80%   (Blank rows = not tested)   LOWER EXTREMITY MMT:    MMT Right eval Left eval  Hip flexion Tolerated slight resistance 4/5  Hip extension    Hip abduction 3/5 4+/5  Hip adduction    Hip internal rotation    Hip external rotation 4-/5 5/5  Knee flexion    Knee extension 5/5 5/5  Ankle dorsiflexion    Ankle plantarflexion    Ankle inversion    Ankle eversion     (Blank rows = not tested)  LUMBAR SPECIAL TESTS:  SLR test:  negative bilat   GAIT: Level of assistance: Complete Independence Comments: Pt minimally leans to the left.  Pt ambulates without an AD.   TREATMENT:                                                                             06/21/24:  SENSATION: LT 2+ t/o bilat LE dermatomes.   PT instructed pt in correct form and palpation.  Pt performed supine TrA contractions with and without 5 sec hold.     Supine marching with TrA contraction 2x10 Supine clams x10 with TrA, 2x10 seated with TrA with GTB Supine bridge with TrA 2x10  Pt received STM to glutes in prone to improve soft tissue mobility and tightness and pain.  PT educated pt and demonstrated using a tennis ball on wall to improve soft tissue mobility and tightness and reduce myofascial adhesions and restrictions.  Pt performed in the clinic also with instruction from therapist.  Pt received a HEP handout and was educated in correct form and appropriate frequency.      PATIENT EDUCATION:  Education details: dx, exercise form, relevant anatomy, POC, rationale of interventions, HEP and what to expect next Rx.  PT answered pt's questions.   Person educated: Patient Education method: Medical illustrator, verbal and tactile cues, handout Education comprehension: verbalized understanding and needs further education, verbal and tactile cues required  HOME EXERCISE PROGRAM: Access Code: [email or web address] URL:  [email or web address] Date: 06/21/2024 Prepared by: Mose Minerva  Exercises - Supine Transversus Abdominis Bracing - Hands on Stomach  - 2 x daily - 7 x weekly - 2 sets - 10 reps - 5 seconds hold - Supine March  - 1 x daily - 7 x weekly - 2 sets - 10 reps - Supine Bridge  - 1 x daily - 6-7 x weekly - 2 sets - 10 reps - Hooklying Clamshell with Resistance  - 1 x daily - 4-5 x weekly - 2 sets - 10 reps - Standing Glute/piriformis Mobilization with Small Ball on Wall   ASSESSMENT:  CLINICAL IMPRESSION: Pt's sensation to LT is WFL in bilat LE's.  PT established HEP today.  PT instructed pt in correct performance of TrA contraction to facilitate improved TrA activation and improved core strength.  Pt performed exercises well with cuing and instruction in correct form.  Pt received a HEP handout and was educated in correct form and appropriate frequency.  PT performed STM to bilat glutes and educated pt with using a tennis ball on wall in a pillowcase to improve soft tissue tightness  and mobility and pain.  She responded well to Rx and should benefit from skilled PT services to address impairments, establish independence with HEP, and to improve overall function.     OBJECTIVE IMPAIRMENTS: Abnormal gait, decreased activity tolerance, decreased endurance, decreased mobility, difficulty walking, decreased ROM, decreased strength, hypomobility, increased fascial restrictions, and pain.   ACTIVITY LIMITATIONS: lifting, standing, stairs, and locomotion level  PARTICIPATION LIMITATIONS: cleaning, shopping, and community activity  PERSONAL FACTORS: Time since onset of injury/illness/exacerbation and 3+ comorbidities: arthritis, bilat hip bursitis, cervical fusion, A-fib are also affecting patient's functional outcome.   REHAB POTENTIAL: Good  CLINICAL DECISION MAKING: Stable/uncomplicated  EVALUATION COMPLEXITY: Low   GOALS:   SHORT TERM GOALS: Target date:  06/21/2024   Pt will  perform home exercises without adverse effects for improved pain, strength, ROM, and function. Baseline: Goal status: INITIAL  2.  Pt will report improved tolerance with daily activities and daily mobility including in the afternoon.   Baseline:  Goal status: INITIAL  3.  Pt will progress with core exercises without adverse effects for improved core stabilization for improved ease and reduced pain with daily activities and household chores.  Baseline:  Goal status: INITIAL    LONG TERM GOALS: Target date:  07/12/2024   Pt will be independent and compliant with HEP for improved pain, ROM, strength, and function.  Baseline:  Goal status: INITIAL  2.  Pt will demo improved R LE strength to = L LE strength for improved tolerance with and performance of daily activities and functional mobility.  Baseline:  Goal status: INITIAL  3.  Pt will report she is able to ambulate her normal community distance without significant pain.   Baseline:  Goal status: INITIAL  4.  Pt will demo improved lumbar AROM to be [facility] for bilat SB'ing and L rotation for improved stiffness and daily mobility.   Baseline:  Goal status: INITIAL    PLAN:  PT FREQUENCY: 1x/week  PT DURATION: 6 weeks  PLANNED INTERVENTIONS: [id number]- PT Re-evaluation, 97750- Physical Performance Testing, 97110-Therapeutic exercises, 97530- Therapeutic activity, [id number]- Neuromuscular re-education, 97535- Self Care, [id number]- Manual therapy, [id number]- Gait training, [phone]- Aquatic Therapy, [phone]- Ultrasound, Patient/Family education, Stair training, Taping, Cryotherapy, and Moist heat.  PLAN FOR NEXT SESSION: STM to glutes.  Review HEP.  Core and LE strengthening.      Leigh Minerva III PT, DPT 06/22/24 8:40 PM

## 2024-06-23 ENCOUNTER — Ambulatory Visit

## 2024-06-23 DIAGNOSIS — R002 Palpitations: Secondary | ICD-10-CM | POA: Diagnosis not present

## 2024-06-23 LAB — CUP PACEART REMOTE DEVICE CHECK
Date Time Interrogation Session: 20250723231139
Implantable Pulse Generator Implant Date: 20220620

## 2024-06-27 ENCOUNTER — Ambulatory Visit: Payer: Self-pay | Admitting: Cardiology

## 2024-06-27 ENCOUNTER — Encounter

## 2024-06-29 ENCOUNTER — Encounter (HOSPITAL_BASED_OUTPATIENT_CLINIC_OR_DEPARTMENT_OTHER): Admitting: Physical Therapy

## 2024-06-29 NOTE — Addendum Note (Signed)
 Addended by: TAWNI DRILLING D on: 06/29/2024 01:12 PM   Modules accepted: Orders

## 2024-06-29 NOTE — Progress Notes (Signed)
 [facility] Summary Report / Loop Recorder

## 2024-07-06 ENCOUNTER — Ambulatory Visit (HOSPITAL_BASED_OUTPATIENT_CLINIC_OR_DEPARTMENT_OTHER): Attending: Family Medicine | Admitting: Physical Therapy

## 2024-07-06 ENCOUNTER — Encounter (HOSPITAL_BASED_OUTPATIENT_CLINIC_OR_DEPARTMENT_OTHER): Payer: Self-pay | Admitting: Physical Therapy

## 2024-07-06 DIAGNOSIS — R29898 Other symptoms and signs involving the musculoskeletal system: Secondary | ICD-10-CM | POA: Insufficient documentation

## 2024-07-06 DIAGNOSIS — M6281 Muscle weakness (generalized): Secondary | ICD-10-CM | POA: Diagnosis present

## 2024-07-06 DIAGNOSIS — M5416 Radiculopathy, lumbar region: Secondary | ICD-10-CM | POA: Insufficient documentation

## 2024-07-06 NOTE — Therapy (Unsigned)
 OUTPATIENT PHYSICAL THERAPY THORACOLUMBAR TREATMENT   Patient Name: Nichole Smith MRN: [id number] DOB:October 20, 1956, 68 y.o., female Today's Date: 07/07/2024  END OF SESSION: PT End of Session - 07/06/2024    Visit Number 3   Number of Visits 6   Date for PT Re-Evaluation 07/12/2024   Authorization Type MCR A&B   PT Start Time  0936   PT Stop Time 1020   PT Time Calculation (min) 44 min   Activity Tolerance Patient tolerated treatment well WFL for tasks assessed/performed   Behavior During Therapy       Past Medical History:  Diagnosis Date   Arthritis    knees, back   Bursitis    hips - almost gone per pt    Carcinoid tumor of lung 2012   right found incidentally on chest xray eval for r/o vasxculitis    Coronary artery calcification 07/23/2016   Dysrhythmia    irregular occassional, paf  no current problem   Frequent UTI    Hypertension    Hypothyroidism    Infertility, female    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  Download 08/2014:  Good compliance, breakthru apnea on 10cm.  Changed to auto 5-12cm >> good control of AHI on f/u download ONO on CPAP:       Osteoarthritis    PCOS (polycystic ovarian syndrome)    PMB (postmenopausal bleeding)    SUI (stress urinary incontinence, female)    h/o   Wears glasses    Past Surgical History:  Procedure Laterality Date   ANTERIOR CERVICAL DECOMP/DISCECTOMY FUSION  12/24/2017   Procedure: Cervical five-six Cervical six-seven Anterior cervical decompression/discectomy/fusion;  Surgeon: Unice Pac, MD;  Location: [facility] OR;  Service: Neurosurgery;;   CARPAL TUNNEL RELEASE Left 12/24/2017   Procedure: LEFT CARPAL TUNNEL RELEASE;  Surgeon: Unice Pac, MD;  Location: [facility] OR;  Service: Neurosurgery;  Laterality: Left;   CATARACT EXTRACTION Bilateral    Right eye 08/24/2023   CESAREAN SECTION     x 1 - twins   COLONOSCOPY  2021    DILATATION & CURRETTAGE/HYSTEROSCOPY WITH RESECTOCOPE N/A 07/18/2013   Procedure: DILATATION & CURETTAGE/HYSTEROSCOPY WITH RESECTOCOPE;  Surgeon: Ronal Elvie Pinal, MD;  Location: [facility];  Service: Gynecology;  Laterality: N/A;  mass resection   HYSTEROSCOPY WITH D & C N/A 05/05/2023   Procedure: DILATATION AND CURETTAGE /HYSTEROSCOPY;  Surgeon: Pinal Ronal RAMAN, MD;  Location: [facility] [place];  Service: Gynecology;  Laterality: N/A;   implantable loop recorder placement  05/20/2021   Medtronic Reveal Linq model [id number] implantable loop recorder (SN [id number])   Lung tumor removed  2012   Rt lung carcinoid   MOUTH SURGERY     pre-cancerous ulcer removed   POLYPECTOMY     SHOULDER OPEN ROTATOR CUFF REPAIR  07/20/2010   Right   WISDOM TOOTH EXTRACTION     Patient Active Problem List   Diagnosis Date Noted   Chronic anticoagulation 10/20/2023   History of colonic polyps 10/20/2023   Urinary incontinence, mixed 10/19/2023   Pelvic organ prolapse quantification stage 2 cystocele 10/19/2023   Paroxysmal atrial fibrillation (HCC) 08/07/2023   Hypercoagulable state due to paroxysmal atrial fibrillation (HCC) 08/07/2023   Polyphagia 07/28/2023   Cataract of both eyes 07/28/2023   Obesity, Starting BMI 46.48 07/28/2023   BMI 38.0-38.9,adult 05/05/2023   Right rotator cuff tear arthropathy 04/29/2023   Recurrent UTI 03/29/2023   Postmenopausal bleeding  03/29/2023   Greater trochanteric bursitis, left 03/04/2023   SOBOE (shortness of breath on exertion) 10/14/2022   Peroneal tendon tear, right, initial encounter 10/07/2022   Avulsion fracture of lateral malleolus 09/09/2022   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   Osteopenia of both hips 08/10/2022   Dermatofibroma 07/25/2022   Digital mucinous cyst of finger of right hand 07/25/2022   Sun-damaged skin 07/25/2022   Gluteal tendonitis of right buttock 06/19/2021   Greater trochanteric bursitis of right hip 05/08/2021   Chronic idiopathic  constipation 05/01/2021   Lumbar radiculopathy 04/04/2021   Healthcare maintenance 12/25/2020   Insulin  resistance 03/05/2020   Vitamin D  deficiency 08/25/2019   Prediabetes 08/25/2019   Acute pain of right knee 09/28/2018   Lichen planus 04/30/2018   Cervical stenosis of spinal canal 12/24/2017   Hyperlipidemia LDL goal <70 08/26/2017   Heart palpitations 08/26/2017   Hammertoe of right foot 01/06/2017   Coronary artery calcification 07/23/2016   Periodic limb movements of sleep 06/18/2016   Atherosclerotic plaque 06/11/2016   Piriformis syndrome of left side 10/24/2015   Loss of transverse plantar arch of left foot 09/12/2015   Thyroiditis, autoimmune 02/26/2015   Obstructive sleep apnea 07/27/2014   Midline low back pain without sciatica 06/20/2014   Polycythemia, secondary 03/22/2014   Severe obesity (BMI >= 40) (HCC) 02/22/2014   Elevated hemoglobin (HCC) 02/22/2014   Left shoulder pain 02/04/2013   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   HNP (herniated nucleus pulposus), cervical 01/22/2011   Visit for preventive health examination 01/22/2011   PAIN IN JOINT, MULTIPLE SITES 01/21/2010   Hypothyroidism 10/16/2008   Class 3 severe obesity with serious comorbidity and body mass index (BMI) of 45.0 to 49.9 in adult 01/07/2008   PALPITATIONS, RECURRENT 01/07/2008   Essential hypertension 06/25/2007   Osteoarthritis 06/25/2007     REFERRING PROVIDER: Claudene Arthea HERO, DO   REFERRING DIAG: F45.83 (ICD-10-CM) - Lumbar radiculopathy   Rationale for Evaluation and Treatment: Rehabilitation  THERAPY DIAG:  Radiculopathy, lumbar region  Muscle weakness (generalized)  Other symptoms and signs involving the musculoskeletal system  ONSET DATE: Exacerbation December 2024  SUBJECTIVE:                                                                                                                                                                                           SUBJECTIVE  STATEMENT:  Pt states her back is irritated.  She thinks she did too much in the aquatic class yesterday.  She did too much twisting.  Pt states it may be the weather.  Pt sees Dr. Claudene this Friday.  Pt states she felt great after  prior Rx.  Pt has tried cherry extract which doesn't help.    Pt reports compliance with HEP.  Pt states she has improved much with TrA contraction  Pt denies any adverse effects after prior Rx.     PERTINENT HISTORY:  -Arthritis--knees, back.  Hx of stenosis.  Lumbar anterolisthesis and mild retrolisthesis. -Bilat Hip bursitis -A-fib, HTN, PCOS -osteoporosis which has changed to osteopenia  -PSHx:   Cervical fusion 2019, Loop recorder implant 2022, R RCR 2011, L carpal tunnel release 2019, and Lung tumor removed 2012   PAIN:  NPRS:  6/10 current, 1/10 best, 9/10 worst Location:  R sided lower lumbar which travels to R glute and around to R ant thigh  Aggravating factors:  standing 10-15 mins Easing factors:  lying down, sitting, bending  PRECAUTIONS: Other: lumbar anterolisthesis and mild retrolisthesis, A-fib, loop recorder implant, cervical fusion, osteopenia   WEIGHT BEARING RESTRICTIONS: No  FALLS:  Has patient fallen in last 6 months? Yes. Number of falls 2  LIVING ENVIRONMENT: Lives with: lives with their spouse Lives in: 1 story home Stairs: no; 1 step to enter home but doesn't have to use that step to enter   OCCUPATION: retired  PLOF:  Pt is independent with mobility, ambulation, and daily activities.  She does have someone that helps her clean her home.  PATIENT GOALS: wants to make sure she is doing the right exercises and not doing things that hurt her back.  Pt wants to be able to get up from the floor.  Better balance.  Improve QOL and avoid back surgery.   OBJECTIVE:  Note: Objective measures were completed at Evaluation unless otherwise noted.  DIAGNOSTIC FINDINGS:  Lumbar MRI: FINDINGS: Segmentation: There are five  lumbar type vertebral bodies. The last full intervertebral disc space is labeled L5-S1. This correlates with the prior study.   Alignment: Stable degenerative anterolisthesis of L3 and mild retrolisthesis of L1.   Vertebrae: No fracture, evidence of discitis, or bone lesion. Endplate reactive changes at L1-2 and L2-3.   Conus medullaris and cauda equina: Conus extends to the L1-2 level. Conus and cauda equina appear normal.   Paraspinal and other soft tissues: No significant paraspinal or retroperitoneal findings.   Disc levels:   T12-L1: No significant findings.   L1-2: Mild degenerative retrolisthesis of L1 with a bulging degenerated annulus, osteophytic ridging and facet disease contributing to mild spinal and bilateral lateral recess stenosis, right greater than left. There is also a E broad-based right-sided disc osteophyte complex potentially affecting the extraforaminal right L1 nerve root. These findings appear relatively stable to slightly progressive.   L2-3: Anterolisthesis of L3 with a bulging uncovered disc and severe disc degeneration. There is also osteophytic ridging and facet disease all contributing to moderate spinal and bilateral lateral recess stenosis. Mild foraminal stenosis bilaterally. These findings are slightly progressive since the prior study.   L3-4: Bulging uncovered disc, osteophytic ridging, facet disease and ligamentum flavum thickening contributing to mild to moderate spinal stenosis and moderate bilateral lateral recess stenosis, right greater than left. Stable mild bilateral foraminal stenosis, right greater than left. These findings appear stable.   L4-5: Bulging degenerated annulus, short pedicles, facet disease and ligamentum flavum thickening contributing to moderate spinal and bilateral lateral recess stenosis. Mild foraminal stenosis bilaterally but no significant change.   L5-S1: Significant facet disease, left greater than  right but no disc protrusions, spinal or foraminal stenosis.   IMPRESSION: 1. Degenerative lumbar spondylosis with multilevel disc disease and facet  disease. Multilevel multifactorial spinal, lateral recess and foraminal stenosis as discussed above at the individual levels. 2. Moderate multifactorial spinal and bilateral lateral recess stenosis at L2-3. Mild foraminal stenosis bilaterally. These findings are slightly progressive since the prior study. 3. Stable mild to moderate spinal stenosis and moderate bilateral lateral recess stenosis, right greater than left at L3-4. Stable mild bilateral foraminal stenosis, right greater than left. 4. Stable moderate spinal and bilateral lateral recess stenosis at L4-5. Mild foraminal stenosis bilaterally but no significant change. 5. Mild spinal and bilateral lateral recess stenosis, right greater than left at L1-2. There is also a broad-based right-sided disc osteophyte complex potentially affecting the extraforaminal right L1 nerve root. These findings appear relatively stable to slightly progressive.  PATIENT SURVEYS:  Modified Oswestry:  MODIFIED OSWESTRY DISABILITY SCALE  Date: 05/31/2024 Score  Pain intensity 4 =  Pain medication provides me with little relief from pain.  2. Personal care (washing, dressing, etc.) 0 =  I can take care of myself normally without causing increased pain.  3. Lifting 3 = Pain prevents me from lifting heavy weights, but I can manage (5) I have hardly any social life because of my pain. light to medium weights if they are conveniently positioned  4. Walking 1 = Pain prevents me from walking more than 1 mile.  5. Sitting 1 =  I can only sit in my favorite chair as long as I like.  6. Standing 1 =  I can stand as long as I want but, it increases my pain.  7. Sleeping 2 =  Even when I take pain medication, I sleep less than 6 hours  8. Social Life 2 = Pain prevents me from participating in more energetic activities  (eg. sports, dancing).  9. Traveling 1 =  I can travel anywhere, but it increases my pain.  10. Employment/ Homemaking 1 = My normal homemaking/job activities increase my pain, but I can still perform all that is required of me  Total 16/50= 32%   Interpretation of scores: Score Category Description  0-20% Minimal Disability The patient can cope with most living activities. Usually no treatment is indicated apart from advice on lifting, sitting and exercise  21-40% Moderate Disability The patient experiences more pain and difficulty with sitting, lifting and standing. Travel and social life are more difficult and they may be disabled from work. Personal care, sexual activity and sleeping are not grossly affected, and the patient can usually be managed by conservative means  41-60% Severe Disability Pain remains the main problem in this group, but activities of daily living are affected. These patients require a detailed investigation  61-80% Crippled Back pain impinges on all aspects of the patient's life. Positive intervention is required  81-100% Bed-bound  These patients are either bed-bound or exaggerating their symptoms  Bluford FORBES Zoe DELENA Karon DELENA, et al. Surgery versus conservative management of stable thoracolumbar fracture: the PRESTO feasibility RCT. [place]): [employer]; 2021 Nov. [facility] Technology Assessment, No. 25.62.) Appendix 3, Oswestry Disability Index category descriptors. Available from: FindJewel[employer].cz  Minimally Clinically Important Difference (MCID) = 12.8%  COGNITION: Overall cognitive status: Within functional limits for tasks assessed       MUSCLE LENGTH: Hamstrings: overall good flexibility in HS with minimal tightness on R.     PALPATION: Pt had tenderness and tightness in bilat glutes.  LUMBAR ROM:   AROM eval  Flexion WFL  Extension   Right lateral flexion 90%  Left lateral flexion 90%  with pain   Right rotation WFL  Left rotation 80%   (Blank rows = not tested)   LOWER EXTREMITY MMT:    MMT Right eval Left eval  Hip flexion Tolerated slight resistance 4/5  Hip extension    Hip abduction 3/5 4+/5  Hip adduction    Hip internal rotation    Hip external rotation 4-/5 5/5  Knee flexion    Knee extension 5/5 5/5  Ankle dorsiflexion    Ankle plantarflexion    Ankle inversion    Ankle eversion     (Blank rows = not tested)  LUMBAR SPECIAL TESTS:  SLR test:  negative bilat   GAIT: Level of assistance: Complete Independence Comments: Pt minimally leans to the left.  Pt ambulates without an AD.   TREATMENT:                                                                             07/06/24:  Supine marching with TrA contraction apprx 12 reps Supine alt UE/LE with TrA 2x10 Supine alt LE ext with TrA 2x10 Supine bridge with TrA 2x10 Supine SLR with TrA 1x10 S/L clams with TrA 2x10 Standing rows with TrA with scap retraction RTB x10, GTB 2x10 Paloff press with TrA with RTB approx 12 each   Pt received a HEP handout and was educated in correct form and appropriate frequency.      PATIENT EDUCATION:  Education details: dx, exercise form, relevant anatomy, POC, rationale of interventions, and HEP.  PT answered pt's questions.   Person educated: Patient Education method: Medical illustrator, verbal and tactile cues, handout Education comprehension: verbalized understanding and needs further education, verbal and tactile cues required  HOME EXERCISE PROGRAM: Access Code: [email or web address] URL: [email or web address] Date: 06/21/2024 Prepared by: Mose Minerva  Exercises - Supine Transversus Abdominis Bracing - Hands on Stomach  - 2 x daily - 7 x weekly - 2 sets - 10 reps - 5 seconds hold - Supine Bridge  - 1 x daily - 6-7 x weekly - 2 sets - 10 reps - Hooklying Clamshell with Resistance  - 1 x daily - 4-5 x weekly - 2 sets - 10 reps - Standing  Glute/piriformis Mobilization with Small Ball on Wall   Updated HEP: - Supine alt UE/LE 1x/day 2x10 - Supine Core Control with Leg Extension  - 1 x daily - 7 x weekly - 2 sets - 10 reps - Supine Active Straight Leg Raise  - 1 x daily - 5 x weekly - 2 sets - 10 reps - Clamshell  - 1 x daily - 5-7 x weekly - 2 sets - 10 reps - Standing Shoulder Row with Anchored Resistance  - 1 x daily - 5 x weekly - 2 sets - 10 reps  ASSESSMENT:  CLINICAL IMPRESSION: PT progressed core strengthening exercises and pt tolerated progression well.  She performed exercises well with cuing and instruction in correct form.   Pt demonstrates improved form with TrA contraction.  PT updated HEP and gave pt a HEP handout.  Pt demonstrates good understanding of HEP.  She responded well to treatment reporting no increased pain after treatment.  Pt should benefit from continued skilled PT services to  address impairments, establish independence with HEP, and to improve overall function.      OBJECTIVE IMPAIRMENTS: Abnormal gait, decreased activity tolerance, decreased endurance, decreased mobility, difficulty walking, decreased ROM, decreased strength, hypomobility, increased fascial restrictions, and pain.   ACTIVITY LIMITATIONS: lifting, standing, stairs, and locomotion level  PARTICIPATION LIMITATIONS: cleaning, shopping, and community activity  PERSONAL FACTORS: Time since onset of injury/illness/exacerbation and 3+ comorbidities: arthritis, bilat hip bursitis, cervical fusion, A-fib are also affecting patient's functional outcome.   REHAB POTENTIAL: Good  CLINICAL DECISION MAKING: Stable/uncomplicated  EVALUATION COMPLEXITY: Low   GOALS:   SHORT TERM GOALS: Target date:  06/21/2024   Pt will perform home exercises without adverse effects for improved pain, strength, ROM, and function. Baseline: Goal status: INITIAL  2.  Pt will report improved tolerance with daily activities and daily mobility including in  the afternoon.   Baseline:  Goal status: INITIAL  3.  Pt will progress with core exercises without adverse effects for improved core stabilization for improved ease and reduced pain with daily activities and household chores.  Baseline:  Goal status: INITIAL    LONG TERM GOALS: Target date:  07/12/2024   Pt will be independent and compliant with HEP for improved pain, ROM, strength, and function.  Baseline:  Goal status: INITIAL  2.  Pt will demo improved R LE strength to = L LE strength for improved tolerance with and performance of daily activities and functional mobility.  Baseline:  Goal status: INITIAL  3.  Pt will report she is able to ambulate her normal community distance without significant pain.   Baseline:  Goal status: INITIAL  4.  Pt will demo improved lumbar AROM to be [facility] for bilat SB'ing and L rotation for improved stiffness and daily mobility.   Baseline:  Goal status: INITIAL    PLAN:  PT FREQUENCY: 1x/week  PT DURATION: 6 weeks  PLANNED INTERVENTIONS: [id number]- PT Re-evaluation, 97750- Physical Performance Testing, 97110-Therapeutic exercises, 97530- Therapeutic activity, [id number]- Neuromuscular re-education, 97535- Self Care, [id number]- Manual therapy, [id number]- Gait training, [phone]- Aquatic Therapy, [phone]- Ultrasound, Patient/Family education, Stair training, Taping, Cryotherapy, and Moist heat.  PLAN FOR NEXT SESSION: STM to glutes.  Review HEP.  Core and LE strengthening.      Leigh Minerva III PT, DPT 07/07/24 11:03 PM

## 2024-07-07 NOTE — Progress Notes (Signed)
 [facility] [address] [place] [address] Phone: [phone] Subjective:   Nichole Smith, am serving as a scribe for Dr. Arthea Claudene.  I'm seeing this patient by the request  of:  Panosh, Apolinar POUR, MD  CC: Low back pain, multiple joint pain  YEP:Dlagzrupcz  04/04/2024 Continues to have back pain.  Going off of a older MRI.SABRA  Attempted another injection based on the old one but not getting the relief that she was getting previously.  At this point I do feel advanced imaging is warranted.  Will get an MRI to further evaluate to see if another treatment is better.  Previously did have a severe spinal stenosis that we will need to continue to monitor.  Patient knows of worsening symptoms to seek medical attention immediately.  Follow-up with me again after MRI to discuss further treatment options     Updated 07/08/2024 Nichole Smith is a 68 y.o. female coming in with complaint of back pain. Epi on 05/05/2024. Epi worked for about 3 weeks and has gradually worn off. Not feeling the best today. L hamstring and knee issues and bursitis on R side is acting up. Currently in PT again.       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  Download 08/2014:  Good compliance, breakthru apnea on 10cm.  Changed to auto 5-12cm >> good control of AHI on f/u download ONO on CPAP:       Osteoarthritis    PCOS (polycystic ovarian syndrome)    PMB (postmenopausal bleeding)    SUI (stress urinary  incontinence, female)    h/o   Wears glasses    Past Surgical History:  Procedure Laterality Date   ANTERIOR CERVICAL DECOMP/DISCECTOMY FUSION  12/24/2017   Procedure: Cervical five-six Cervical six-seven Anterior cervical decompression/discectomy/fusion;  Surgeon: Unice Pac, MD;  Location: [facility];  Service: Neurosurgery;;   CARPAL TUNNEL RELEASE Left 12/24/2017   Procedure: LEFT CARPAL TUNNEL RELEASE;  Surgeon: Unice Pac, MD;  Location: [facility] OR;  Service: Neurosurgery;  Laterality: Left;   CATARACT EXTRACTION Bilateral    Right eye 08/24/2023   CESAREAN SECTION     x 1 - twins   COLONOSCOPY  2021   DILATATION & CURRETTAGE/HYSTEROSCOPY WITH RESECTOCOPE N/A 07/18/2013   Procedure: DILATATION & CURETTAGE/HYSTEROSCOPY WITH RESECTOCOPE;  Surgeon: Ronal Elvie Pinal, MD;  Location: [facility];  Service: Gynecology;  Laterality: N/A;  mass resection   HYSTEROSCOPY WITH D & C N/A 05/05/2023   Procedure: DILATATION AND CURETTAGE /HYSTEROSCOPY;  Surgeon: Pinal Ronal RAMAN, MD;  Location: [facility];  Service: Gynecology;  Laterality: N/A;   implantable loop recorder placement  05/20/2021   Medtronic Reveal Linq model [id number] implantable loop recorder (SN [id number])   Lung tumor removed  2012   Rt lung carcinoid   MOUTH SURGERY     pre-cancerous ulcer removed   POLYPECTOMY  SHOULDER OPEN ROTATOR CUFF REPAIR  07/20/2010   Right   WISDOM TOOTH EXTRACTION     Social History   Socioeconomic History   Marital status: Married    Spouse name: Cheryl   Number of children: 2   Years of education: Not on file   Highest education level: Bachelor's degree (e.g., BA, AB, BS)  Occupational History   Occupation: retired    Associate Professor: Museum/gallery exhibitions officer  Tobacco Use   Smoking status: Never   Smokeless tobacco: Never   Tobacco comments:    Never smoked 08/07/23  Vaping Use   Vaping status: Never Used  Substance and Sexual Activity   Alcohol use: Yes    Comment: occ    Drug use: No   Sexual activity: Not Currently    Partners: Male    Birth control/protection: Post-menopausal  Other Topics Concern   Not on file  Social History Narrative   hhof 2    2 Children at college and  beyond      [name]    Married    Retired age 89 2015   Recently moved taking care of grandchildren during the week.   Social Drivers of Corporate investment banker Strain: Low Risk  (05/28/2024)   Overall Financial Resource Strain (CARDIA)    Difficulty of Paying Living Expenses: Not hard at all  Food Insecurity: No Food Insecurity (05/28/2024)   Hunger Vital Sign    Worried About Running Out of Food in the Last Year: Never true    Ran Out of Food in the Last Year: Never true  Transportation Needs: No Transportation Needs (05/28/2024)   PRAPARE - Administrator, Civil Service (Medical): No    Lack of Transportation (Non-Medical): No  Physical Activity: Insufficiently Active (05/28/2024)   Exercise Vital Sign    Days of Exercise per Week: 4 days    Minutes of Exercise per Session: 30 min  Stress: No Stress Concern Present (05/28/2024)   [employer] of Occupational Health - Occupational Stress Questionnaire    Feeling of Stress: Only a little  Social Connections: Socially Integrated (05/28/2024)   Social Connection and Isolation Panel    Frequency of Communication with Friends and Family: Twice a week    Frequency of Social Gatherings with Friends and Family: Twice a week    Attends Religious Services: More than 4 times per year    Active Member of [employer] or Organizations: Yes    Attends Engineer, structural: More than 4 times per year    Marital Status: Married   Allergies  Allergen Reactions   Neomycin Other (See Comments)    Inflammation--eye drop    Ace Inhibitors Cough   Family History  Problem Relation Age of Onset   Stroke Mother        died age 52   Hypertension Mother    Sudden death Mother    Alcoholism Mother     Coronary artery disease Father        died age 55   Hypertension Father    Liver disease Father    Alcohol abuse Father    Obesity Father    Parkinson's disease Brother    Diabetes Brother    Hypertension Brother    Hypertension Maternal Uncle    Colon cancer Neg Hx    Rectal cancer Neg Hx    Stomach cancer Neg Hx    Colon polyps Neg Hx    Esophageal cancer  Neg Hx     Current Outpatient Medications (Endocrine & Metabolic):    alendronate  (FOSAMAX ) 70 MG tablet, Take 1 tablet (70 mg total) by mouth every 7 (seven) days. Take first thing in am with 6 oz. Water.  Be upright after taking.  Eat nothing for one hour.   SYNTHROID  125 MCG tablet, Take 1 tablet (125 mcg total) by mouth daily before breakfast. Dosage change  Current Outpatient Medications (Cardiovascular):    atorvastatin  (LIPITOR) 40 MG tablet, Take 1 tablet (40 mg total) by mouth daily.   chlorthalidone  (HYGROTON ) 25 MG tablet, TAKE 1 TABLET DAILY   diltiazem  (CARDIZEM  CD) 180 MG 24 hr capsule, TAKE 1 CAPSULE DAILY   diltiazem  (CARDIZEM ) 30 MG tablet, 1 tablet every 6 hours for palpitations   metoprolol  succinate (TOPROL -XL) 50 MG 24 hr tablet, TAKE 1 TABLET DAILY   Current Outpatient Medications (Analgesics):    Suzetrigine  (JOURNAVX ) 50 MG TABS, Take 50 mg by mouth 2 (two) times daily.  Current Outpatient Medications (Hematological):    XARELTO  20 MG TABS tablet, TAKE 1 TABLET DAILY WITH SUPPER  Current Outpatient Medications (Other):    gabapentin  (NEURONTIN ) 100 MG capsule, Take 2 capsules (200 mg total) by mouth at bedtime.   CALCIUM  PO, Take 1 tablet by mouth at bedtime.    Doxepin  HCl 3 MG TABS, TAKE 1 TABLET (3 MG TOTAL) BY MOUTH AT BEDTIME AS NEEDED.   estradiol  (ESTRACE ) 0.1 MG/GM vaginal cream, INSERT 1 GM VAGINALLY TWICE WEEKLY   gabapentin  (NEURONTIN ) 400 MG capsule, TAKE 1 CAPSULE TWICE A DAY   GEMTESA  75 MG TABS, Take 1 tablet (75 mg total) by mouth daily as needed.   Multiple Vitamins-Minerals  (PRESERVISION AREDS 2 PO), Take 1 tablet by mouth 2 (two) times daily.   Probiotic Product (PROBIOTIC PO), Take by mouth.   ZEPBOUND  15 MG/0.5ML Pen, Inject 15 mg into the skin once a week.   Reviewed prior external information including notes and imaging from  primary care provider As well as notes that were available from care everywhere and other healthcare systems.  Past medical history, social, surgical and family history all reviewed in electronic medical record.  No pertanent information unless stated regarding to the chief complaint.   Review of Systems:  No headache, visual changes, nausea, vomiting, diarrhea, constipation, dizziness, abdominal pain, skin rash, fevers, chills, night sweats, weight loss, swollen lymph nodes, body aches, joint swelling, chest pain, shortness of breath, mood changes. POSITIVE muscle aches  Objective  Blood pressure 108/82, pulse 68, height 4' 11 (1.499 m), weight 189 lb (85.7 kg), last menstrual period 02/12/2011, SpO2 96%.   General: No apparent distress alert and oriented x3 mood and affect normal, dressed appropriately.  HEENT: Pupils equal, extraocular movements intact  Respiratory: Patient's speak in full sentences and does not appear short of breath  Cardiovascular: No lower extremity edema, non tender, no erythema  Antalgic gait noted.  Patient is favoring the right hip.  Severe tenderness to palpation on exam today mostly over the greater trochanteric area.  The severity of pain is 8 out of 10.  Negative straight leg test, but does have difficulty with extension of the back.   After verbal consent patient was prepped with alcohol swab and with a 21-gauge 2 inch needle injected into the right greater trochanteric area with 2 cc of 0.5% Marcaine  and 1 cc of Kenalog  40 mg/mL.  No blood loss.  Band-Aid placed.  Postinjection instructions given    Impression and Recommendations:  The above documentation has been reviewed and is accurate and  complete Elya Tarquinio M Yzabella Crunk, DO

## 2024-07-08 ENCOUNTER — Ambulatory Visit: Admitting: Family Medicine

## 2024-07-08 VITALS — BP 108/82 | HR 68 | Ht 59.0 in | Wt 189.0 lb

## 2024-07-08 DIAGNOSIS — M7061 Trochanteric bursitis, right hip: Secondary | ICD-10-CM

## 2024-07-08 DIAGNOSIS — M5416 Radiculopathy, lumbar region: Secondary | ICD-10-CM | POA: Diagnosis not present

## 2024-07-08 MED ORDER — JOURNAVX 50 MG PO TABS: 50.0000 mg | ORAL_TABLET | Freq: Two times a day (BID) | ORAL | 2 refills | Status: DC

## 2024-07-08 MED ORDER — GABAPENTIN 100 MG PO CAPS: 200.0000 mg | ORAL_CAPSULE | Freq: Every day | ORAL | 0 refills | Status: DC

## 2024-07-08 NOTE — Patient Instructions (Addendum)
 Journax prescribed Injection today Decrease gabapentin  200mg  2x a day for 2 week 100mg  2x a day for 2 weeks Continue all vitamins See you again in 2 month

## 2024-07-09 ENCOUNTER — Encounter: Payer: Self-pay | Admitting: Family Medicine

## 2024-07-09 NOTE — Assessment & Plan Note (Addendum)
 Will continue to monitor.  Does have spinal stenosis.  Found patient still holding out for any type of surgical intervention.  Discussed repeating epidurals that has worked previously.  Continue to work on weight loss.  No change in medication.  Return in 2 to 3 months did prescribe a trial Journavax and warned the potential trial aisles with prescription of medication as well as potential side effects.  Also titrating down off of the gabapentin  new prescription of 100 mg twice daily prescribed

## 2024-07-09 NOTE — Assessment & Plan Note (Signed)
 Repeat injection given today.  Encourage patient continue to work on weight loss and staying active.  Patient has done a relatively good job.  Has been trying to change different diet changes as well.  Wants to stay away from too many medications if possible at this time.  Due to patient having atrial fibrillation has been on Eliquis but been stable for a while so will discuss with cardiologist coming off of this to see if then she would have a opportunity to do anti-inflammatories.  Would recommend the potential for Celebrex discussed icing regimen and home exercises otherwise.  Follow-up again in 6 to 8 weeks

## 2024-07-12 ENCOUNTER — Other Ambulatory Visit: Payer: Self-pay

## 2024-07-12 ENCOUNTER — Encounter: Payer: Self-pay | Admitting: Family Medicine

## 2024-07-12 MED ORDER — JOURNAVX 50 MG PO TABS: 50.0000 mg | ORAL_TABLET | Freq: Two times a day (BID) | ORAL | 0 refills | Status: AC

## 2024-07-13 ENCOUNTER — Encounter (HOSPITAL_BASED_OUTPATIENT_CLINIC_OR_DEPARTMENT_OTHER): Admitting: Physical Therapy

## 2024-07-14 ENCOUNTER — Encounter (HOSPITAL_BASED_OUTPATIENT_CLINIC_OR_DEPARTMENT_OTHER): Payer: Self-pay | Admitting: Physical Therapy

## 2024-07-14 ENCOUNTER — Ambulatory Visit (HOSPITAL_BASED_OUTPATIENT_CLINIC_OR_DEPARTMENT_OTHER): Payer: Self-pay | Admitting: Physical Therapy

## 2024-07-14 DIAGNOSIS — M5416 Radiculopathy, lumbar region: Secondary | ICD-10-CM | POA: Diagnosis not present

## 2024-07-14 DIAGNOSIS — R29898 Other symptoms and signs involving the musculoskeletal system: Secondary | ICD-10-CM

## 2024-07-14 DIAGNOSIS — M6281 Muscle weakness (generalized): Secondary | ICD-10-CM

## 2024-07-14 NOTE — Therapy (Signed)
 OUTPATIENT PHYSICAL THERAPY THORACOLUMBAR TREATMENT   Patient Name: Nichole Smith MRN: [id number] DOB:10-30-1956, 68 y.o., female Today's Date: 07/15/2024  END OF SESSION: PT End of Session - 07/14/2024    Visit Number 4   Number of Visits 8   Date for PT Re-Evaluation 08/11/2024   Authorization Type MCR A&B   PT Start Time  1458   PT Stop Time 1540   PT Time Calculation (min) 42 min   Activity Tolerance Patient tolerated treatment well    Behavior During Therapy  [facility] for tasks assessed/performed     Past Medical History:  Diagnosis Date   Arthritis    knees, back   Bursitis    hips - almost gone per pt    Carcinoid tumor of lung 2012   right found incidentally on chest xray eval for r/o vasxculitis    Coronary artery calcification 07/23/2016   Dysrhythmia    irregular occassional, paf  no current problem   Frequent UTI    Hypertension    Hypothyroidism    Infertility, female    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  Download 08/2014:  Good compliance, breakthru apnea on 10cm.  Changed to auto 5-12cm >> good control of AHI on f/u download ONO on CPAP:       Osteoarthritis    PCOS (polycystic ovarian syndrome)    PMB (postmenopausal bleeding)    SUI (stress urinary incontinence, female)    h/o   Wears glasses    Past Surgical History:  Procedure Laterality Date   ANTERIOR CERVICAL DECOMP/DISCECTOMY FUSION  12/24/2017   Procedure: Cervical five-six Cervical six-seven Anterior cervical decompression/discectomy/fusion;  Surgeon: Unice Pac, MD;  Location: [facility] OR;  Service: Neurosurgery;;   CARPAL TUNNEL RELEASE Left 12/24/2017   Procedure: LEFT CARPAL TUNNEL RELEASE;  Surgeon: Unice Pac, MD;  Location: [facility] OR;  Service: Neurosurgery;  Laterality: Left;   CATARACT EXTRACTION Bilateral    Right eye 08/24/2023   CESAREAN SECTION     x 1 - twins   COLONOSCOPY  2021    DILATATION & CURRETTAGE/HYSTEROSCOPY WITH RESECTOCOPE N/A 07/18/2013   Procedure: DILATATION & CURETTAGE/HYSTEROSCOPY WITH RESECTOCOPE;  Surgeon: Ronal Elvie Pinal, MD;  Location: [facility];  Service: Gynecology;  Laterality: N/A;  mass resection   HYSTEROSCOPY WITH D & C N/A 05/05/2023   Procedure: DILATATION AND CURETTAGE /HYSTEROSCOPY;  Surgeon: Pinal Ronal RAMAN, MD;  Location: [facility];  Service: Gynecology;  Laterality: N/A;   implantable loop recorder placement  05/20/2021   [employer]tronic Reveal Linq model [id number] implantable loop recorder (SN [id number])   Lung tumor removed  2012   Rt lung carcinoid   MOUTH SURGERY     pre-cancerous ulcer removed   POLYPECTOMY     SHOULDER OPEN ROTATOR CUFF REPAIR  07/20/2010   Right   WISDOM TOOTH EXTRACTION     Patient Active Problem List   Diagnosis Date Noted   Chronic anticoagulation 10/20/2023   History of colonic polyps 10/20/2023   Urinary incontinence, mixed 10/19/2023   Pelvic organ prolapse quantification stage 2 cystocele 10/19/2023   Paroxysmal atrial fibrillation (HCC) 08/07/2023   Hypercoagulable state due to paroxysmal atrial fibrillation (HCC) 08/07/2023   Polyphagia 07/28/2023   Cataract of both eyes 07/28/2023   Obesity, Starting BMI 46.48 07/28/2023   BMI 38.0-38.9,adult 05/05/2023   Right rotator cuff tear arthropathy 04/29/2023   Recurrent UTI 03/29/2023   Postmenopausal bleeding  03/29/2023   Greater trochanteric bursitis, left 03/04/2023   SOBOE (shortness of breath on exertion) 10/14/2022   Peroneal tendon tear, right, initial encounter 10/07/2022   Avulsion fracture of lateral malleolus 09/09/2022   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   Osteopenia of both hips 08/10/2022   Dermatofibroma 07/25/2022   Digital mucinous cyst of finger of right hand 07/25/2022   Sun-damaged skin 07/25/2022   Gluteal tendonitis of right buttock 06/19/2021   Greater trochanteric bursitis of right hip 05/08/2021   Chronic idiopathic  constipation 05/01/2021   Lumbar radiculopathy 04/04/2021   Healthcare maintenance 12/25/2020   Insulin  resistance 03/05/2020   Vitamin D  deficiency 08/25/2019   Prediabetes 08/25/2019   Acute pain of right knee 09/28/2018   Lichen planus 04/30/2018   Cervical stenosis of spinal canal 12/24/2017   Hyperlipidemia LDL goal <70 08/26/2017   Heart palpitations 08/26/2017   Hammertoe of right foot 01/06/2017   Coronary artery calcification 07/23/2016   Periodic limb movements of sleep 06/18/2016   Atherosclerotic plaque 06/11/2016   Piriformis syndrome of left side 10/24/2015   Loss of transverse plantar arch of left foot 09/12/2015   Thyroiditis, autoimmune 02/26/2015   Obstructive sleep apnea 07/27/2014   Midline low back pain without sciatica 06/20/2014   Polycythemia, secondary 03/22/2014   Severe obesity (BMI >= 40) (HCC) 02/22/2014   Elevated hemoglobin (HCC) 02/22/2014   Left shoulder pain 02/04/2013   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   HNP (herniated nucleus pulposus), cervical 01/22/2011   Visit for preventive health examination 01/22/2011   PAIN IN JOINT, MULTIPLE SITES 01/21/2010   Hypothyroidism 10/16/2008   Class 3 severe obesity with serious comorbidity and body mass index (BMI) of 45.0 to 49.9 in adult 01/07/2008   PALPITATIONS, RECURRENT 01/07/2008   Essential hypertension 06/25/2007   Osteoarthritis 06/25/2007     REFERRING PROVIDER: Claudene Arthea HERO, DO   REFERRING DIAG: F45.83 (ICD-10-CM) - Lumbar radiculopathy   Rationale for Evaluation and Treatment: Rehabilitation  THERAPY DIAG:  Radiculopathy, lumbar region  Muscle weakness (generalized)  Other symptoms and signs involving the musculoskeletal system  ONSET DATE: Exacerbation December 2024  SUBJECTIVE:                                                                                                                                                                                           SUBJECTIVE  STATEMENT:  Pt states I'm cautiously saying better in reference to tolerance with daily activities and mobility in the afternoon.  Last week was terrible in the afternoon though it's better this week.  Pt hasn't walked long distance.   Pt denies any adverse effects after prior  Rx.  Pt reports compliance with HEP.    PERTINENT HISTORY:  -Arthritis--knees, back.  Hx of stenosis.  Lumbar anterolisthesis and mild retrolisthesis. -Bilat Hip bursitis -A-fib, HTN, PCOS -osteoporosis which has changed to osteopenia  -PSHx:   Cervical fusion 2019, Loop recorder implant 2022, R RCR 2011, L carpal tunnel release 2019, and Lung tumor removed 2012   PAIN:  NPRS:  6/10 current, 1/10 best, 9/10 worst Location:  R sided lower lumbar which travels to R glute and around to R ant thigh  Aggravating factors:  standing 10-15 mins Easing factors:  lying down, sitting, bending  PRECAUTIONS: Other: lumbar anterolisthesis and mild retrolisthesis, A-fib, loop recorder implant, cervical fusion, osteopenia   WEIGHT BEARING RESTRICTIONS: No  FALLS:  Has patient fallen in last 6 months? Yes. Number of falls 2  LIVING ENVIRONMENT: Lives with: lives with their spouse Lives in: 1 story home Stairs: no; 1 step to enter home but doesn't have to use that step to enter   OCCUPATION: retired  PLOF:  Pt is independent with mobility, ambulation, and daily activities.  She does have someone that helps her clean her home.  PATIENT GOALS: wants to make sure she is doing the right exercises and not doing things that hurt her back.  Pt wants to be able to get up from the floor.  Better balance.  Improve QOL and avoid back surgery.  Pt wants to be able to perform star s   OBJECTIVE:  Note: Objective measures were completed at Evaluation unless otherwise noted.  DIAGNOSTIC FINDINGS:  Lumbar MRI: FINDINGS: Segmentation: There are five lumbar type vertebral bodies. The last full intervertebral disc space is  labeled L5-S1. This correlates with the prior study.   Alignment: Stable degenerative anterolisthesis of L3 and mild retrolisthesis of L1.   Vertebrae: No fracture, evidence of discitis, or bone lesion. Endplate reactive changes at L1-2 and L2-3.   Conus medullaris and cauda equina: Conus extends to the L1-2 level. Conus and cauda equina appear normal.   Paraspinal and other soft tissues: No significant paraspinal or retroperitoneal findings.   Disc levels:   T12-L1: No significant findings.   L1-2: Mild degenerative retrolisthesis of L1 with a bulging degenerated annulus, osteophytic ridging and facet disease contributing to mild spinal and bilateral lateral recess stenosis, right greater than left. There is also a E broad-based right-sided disc osteophyte complex potentially affecting the extraforaminal right L1 nerve root. These findings appear relatively stable to slightly progressive.   L2-3: Anterolisthesis of L3 with a bulging uncovered disc and severe disc degeneration. There is also osteophytic ridging and facet disease all contributing to moderate spinal and bilateral lateral recess stenosis. Mild foraminal stenosis bilaterally. These findings are slightly progressive since the prior study.   L3-4: Bulging uncovered disc, osteophytic ridging, facet disease and ligamentum flavum thickening contributing to mild to moderate spinal stenosis and moderate bilateral lateral recess stenosis, right greater than left. Stable mild bilateral foraminal stenosis, right greater than left. These findings appear stable.   L4-5: Bulging degenerated annulus, short pedicles, facet disease and ligamentum flavum thickening contributing to moderate spinal and bilateral lateral recess stenosis. Mild foraminal stenosis bilaterally but no significant change.   L5-S1: Significant facet disease, left greater than right but no disc protrusions, spinal or foraminal stenosis.    IMPRESSION: 1. Degenerative lumbar spondylosis with multilevel disc disease and facet disease. Multilevel multifactorial spinal, lateral recess and foraminal stenosis as discussed above at the individual levels. 2. Moderate multifactorial spinal and bilateral  lateral recess stenosis at L2-3. Mild foraminal stenosis bilaterally. These findings are slightly progressive since the prior study. 3. Stable mild to moderate spinal stenosis and moderate bilateral lateral recess stenosis, right greater than left at L3-4. Stable mild bilateral foraminal stenosis, right greater than left. 4. Stable moderate spinal and bilateral lateral recess stenosis at L4-5. Mild foraminal stenosis bilaterally but no significant change. 5. Mild spinal and bilateral lateral recess stenosis, right greater than left at L1-2. There is also a broad-based right-sided disc osteophyte complex potentially affecting the extraforaminal right L1 nerve root. These findings appear relatively stable to slightly progressive.  PATIENT SURVEYS:  Modified Oswestry:  MODIFIED OSWESTRY DISABILITY SCALE   Date: 05/31/2024 Score 07/14/24  Pain intensity 4 =  Pain medication provides me with little relief from pain. 1  2. Personal care (washing, dressing, etc.) 0 =  I can take care of myself normally without causing increased pain. 0  3. Lifting 3 = Pain prevents me from lifting heavy weights, but I can manage (5) I have hardly any social life because of my pain. light to medium weights if they are conveniently positioned 3  4. Walking 1 = Pain prevents me from walking more than 1 mile. 1  5. Sitting 1 =  I can only sit in my favorite chair as long as I like. 2  6. Standing 1 =  I can stand as long as I want but, it increases my pain. 4  7. Sleeping 2 =  Even when I take pain medication, I sleep less than 6 hours 1  8. Social Life 2 = Pain prevents me from participating in more energetic activities (eg. sports, dancing). 2  9. Traveling  1 =  I can travel anywhere, but it increases my pain. 0  10. Employment/ Homemaking 1 = My normal homemaking/job activities increase my pain, but I can still perform all that is required of me 2  Total 16/50= 32% 15/50= 30%   Interpretation of scores: Score Category Description  0-20% Minimal Disability The patient can cope with most living activities. Usually no treatment is indicated apart from advice on lifting, sitting and exercise  21-40% Moderate Disability The patient experiences more pain and difficulty with sitting, lifting and standing. Travel and social life are more difficult and they may be disabled from work. Personal care, sexual activity and sleeping are not grossly affected, and the patient can usually be managed by conservative means  41-60% Severe Disability Pain remains the main problem in this group, but activities of daily living are affected. These patients require a detailed investigation  61-80% Crippled Back pain impinges on all aspects of the patient's life. Positive intervention is required  81-100% Bed-bound  These patients are either bed-bound or exaggerating their symptoms  Bluford FORBES Zoe DELENA Karon DELENA, et al. Surgery versus conservative management of stable thoracolumbar fracture: the PRESTO feasibility RCT. [place]): [employer]; 2021 Nov. [facility] Technology Assessment, No. 25.62.) Appendix 3, Oswestry Disability Index category descriptors. Available from: FindJewel[employer].cz  Minimally Clinically Important Difference (MCID) = 12.8%  COGNITION: Overall cognitive status: Within functional limits for tasks assessed     LUMBAR ROM:   AROM eval 8/14  Flexion WFL   Extension    Right lateral flexion 90% 90% with pain  Left lateral flexion 90% with pain 90% with pain  Right rotation [facility] WFL  Left rotation 80% WFL   (Blank rows = not tested)   LOWER EXTREMITY MMT:    MMT  Right eval Left eval Right 8/14  Left 8/14  Hip flexion Tolerated slight resistance 4/5 5/5 5/5  Hip extension      Hip abduction 3/5 4+/5 4-/5 4+/5  Hip adduction      Hip internal rotation      Hip external rotation 4-/5 5/5 4+/5   Knee flexion      Knee extension 5/5 5/5    Ankle dorsiflexion      Ankle plantarflexion      Ankle inversion      Ankle eversion       (Blank rows = not tested)   TREATMENT:                                                                             07/14/24:  PT assessed LE strength and lumbar AROM.  Pt completed Modified Oswestry Disability Index.  See above  Supine alt UE/LE with TrA x10 Supine alt LE ext with TrA 2x10 while holding ball Supine bridge with TrA 2x10 Supine SLR with TrA 1x10, with alt UE extension x 10 Sidestepping with RTB around thighs with TrA x 2 laps at rail Standing rows with TrA with scap retraction RTB x10, GTB 2x10 Paloff press with TrA with GTB x 10 each   Reviewed and Updated HEP.  Pt received a HEP handout and was educated in correct form and appropriate frequency.      PATIENT EDUCATION:  Education details: dx, exercise form, goal progress, objective findings relevant anatomy, POC, rationale of interventions, and HEP.  PT answered pt's questions.   Person educated: Patient Education method: Medical illustrator, verbal and tactile cues, handout Education comprehension: verbalized understanding and needs further education, verbal and tactile cues required  HOME EXERCISE PROGRAM: Access Code: [email or web address] URL: [email or web address] Date: 06/21/2024 Prepared by: Mose Minerva  Exercises - Supine Transversus Abdominis Bracing - Hands on Stomach  - 2 x daily - 7 x weekly - 2 sets - 10 reps - 5 seconds hold - Supine Bridge  - 1 x daily - 6-7 x weekly - 2 sets - 10 reps - Hooklying Clamshell with Resistance  - 1 x daily - 4-5 x weekly - 2 sets - 10 reps - Standing Glute/piriformis Mobilization with Small Ball on Wall  - Supine  alt UE/LE 1x/day 2x10 - Supine Core Control with Leg Extension  - 1 x daily - 7 x weekly - 2 sets - 10 reps - Supine Active Straight Leg Raise  - 1 x daily - 5 x weekly - 2 sets - 10 reps - Clamshell  - 1 x daily - 5-7 x weekly - 2 sets - 10 reps - Standing Shoulder Row with Anchored Resistance  - 1 x daily - 5 x weekly - 2 sets - 10 reps  Updated HEP: - Standing Anti-Rotation Press with Anchored Resistance  - 1 x daily - 5 x weekly - 2 sets - 10 reps  ASSESSMENT:  CLINICAL IMPRESSION: Pt continues to have pain and doesn't report much subjective improvement.  PT is working on [employer] including advancing her HEP.  PT updated HEP and went through how to progress exercises and the correct exercises to perform.  PT educated pt  with appropriate resistance and appropriate frequency.  Pt demonstrates good understanding.  Pt demonstrated improved lumbar L rotation AROM and had no change in bilat Sb'ing.  Pt demonstrates improved hip flexion, R hip ER, and R hip abd strength as evidenced by MMT.  Pt has slight improvement in modified Oswestry disability index though not clinically significant. Pt tolerated exercises well and had no c/o's after treatment.  Pt has met STG's #1,3 and is progressing toward other goals.  She should benefit from continued skilled PT services to address impairments, establish independence with HEP, and to improve overall function.       OBJECTIVE IMPAIRMENTS: Abnormal gait, decreased activity tolerance, decreased endurance, decreased mobility, difficulty walking, decreased ROM, decreased strength, hypomobility, increased fascial restrictions, and pain.   ACTIVITY LIMITATIONS: lifting, standing, stairs, and locomotion level  PARTICIPATION LIMITATIONS: cleaning, shopping, and community activity  PERSONAL FACTORS: Time since onset of injury/illness/exacerbation and 3+ comorbidities: arthritis, bilat hip bursitis, cervical fusion, A-fib are also affecting patient's functional outcome.    REHAB POTENTIAL: Good  CLINICAL DECISION MAKING: Stable/uncomplicated  EVALUATION COMPLEXITY: Low   GOALS:   SHORT TERM GOALS: Target date:  06/21/2024   Pt will perform home exercises without adverse effects for improved pain, strength, ROM, and function. Baseline: Goal status: GOAL MET  8/14  2.  Pt will report improved tolerance with daily activities and daily mobility including in the afternoon.   Baseline:  Goal status: ONGOING  3.  Pt will progress with core exercises without adverse effects for improved core stabilization for improved ease and reduced pain with daily activities and household chores.  Baseline:  Goal status: GOAL MET  8/14    LONG TERM GOALS: Target date:  08/11/2024   Pt will be independent and compliant with HEP for improved pain, ROM, strength, and function.  Baseline:  Goal status: PROGRESSING  2.  Pt will demo improved R LE strength to = L LE strength for improved tolerance with and performance of daily activities and functional mobility.  Baseline:  Goal status: PROGRESSING  3.  Pt will report she is able to ambulate her normal community distance without significant pain.   Baseline:  Goal status: ONGONG  4.  Pt will demo improved lumbar AROM to be [facility] for bilat SB'ing and L rotation for improved stiffness and daily mobility.   Baseline:  Goal status: 33%  MET   8/14    PLAN:  PT FREQUENCY: 1x/week  PT DURATION: 4 weeks     PLANNED INTERVENTIONS: [id number]- PT Re-evaluation, 97750- Physical Performance Testing, 97110-Therapeutic exercises, 97530- Therapeutic activity, [id number]- Neuromuscular re-education, 97535- Self Care, [id number]- Manual therapy, [id number]- Gait training, [phone]- Aquatic Therapy, [phone]- Ultrasound, Patient/Family education, Stair training, Taping, Cryotherapy, and Moist heat.  PLAN FOR NEXT SESSION: STM to glutes.  Progress HEP.  Core and LE strengthening.      Leigh Minerva III PT, DPT 07/15/24 1:18 PM

## 2024-07-22 ENCOUNTER — Encounter (HOSPITAL_BASED_OUTPATIENT_CLINIC_OR_DEPARTMENT_OTHER): Payer: Self-pay | Admitting: Obstetrics & Gynecology

## 2024-07-22 ENCOUNTER — Telehealth (HOSPITAL_BASED_OUTPATIENT_CLINIC_OR_DEPARTMENT_OTHER): Payer: Self-pay

## 2024-07-22 NOTE — Telephone Encounter (Signed)
 Spoke with patient. Patient states that she began having light bleeding on 07/19/24. Is currently wearing a pad that she is changing twice daily for sanitary reasons not due to it being full. Reports mild lower back pain and cramping. Denies dizziness, SOB, and pelvic pain. Advised patient will need to be seen in office for further evaluation. Patient is agreeable. Appointment scheduled for 07/25/24 at 9:35 am with Dr.Miller. Patient is agreeable to date and time.  Patient would like to know if she needs to hold her Xarelto  until being seen. Advised will review with Dr.Miller and return call. Patient is agreeable.

## 2024-07-25 ENCOUNTER — Ambulatory Visit

## 2024-07-25 ENCOUNTER — Ambulatory Visit (INDEPENDENT_AMBULATORY_CARE_PROVIDER_SITE_OTHER): Admitting: Obstetrics & Gynecology

## 2024-07-25 ENCOUNTER — Encounter (HOSPITAL_BASED_OUTPATIENT_CLINIC_OR_DEPARTMENT_OTHER): Payer: Self-pay | Admitting: Obstetrics & Gynecology

## 2024-07-25 VITALS — BP 124/67 | HR 67 | Ht 59.0 in | Wt 184.0 lb

## 2024-07-25 DIAGNOSIS — N950 Postmenopausal bleeding: Secondary | ICD-10-CM | POA: Diagnosis not present

## 2024-07-25 DIAGNOSIS — R002 Palpitations: Secondary | ICD-10-CM | POA: Diagnosis not present

## 2024-07-25 DIAGNOSIS — R350 Frequency of micturition: Secondary | ICD-10-CM

## 2024-07-25 LAB — POC URINALSYSI DIPSTICK (AUTOMATED)
Bilirubin, UA: NEGATIVE
Blood, UA: NEGATIVE
Glucose, UA: NEGATIVE
Ketones, UA: NEGATIVE
Leukocytes, UA: NEGATIVE
Nitrite, UA: NEGATIVE
Protein, UA: NEGATIVE
Spec Grav, UA: 1.015 (ref 1.010–1.025)
Urobilinogen, UA: 0.2 U/dL — AB
pH, UA: 7 (ref 5.0–8.0)

## 2024-07-25 NOTE — Telephone Encounter (Signed)
 Patient was seen in office on 07/25/24 with Dr.Miller.

## 2024-07-25 NOTE — Progress Notes (Signed)
 GYNECOLOGY  VISIT  CC:   PMP bleeding  HPI: 68 y.o. [id number] Married White or Caucasian female here for for complaint of PMP bleeding.  This started at the end of the week.  She is on Xarelto  and stopped it this weekend because she was having cramping and some heavier bleeding.  It has basically stopped now.  Had hysteroscopy in June last year.  Pathology was negative.  Discussed repeating ultrasound and biopsy.  She is comfortable proceeding with ultrasound first.  Will get this scheduled.  Last pap was 12/2022 and was negative.     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  Download 08/2014:  Good compliance, breakthru apnea on 10cm.  Changed to auto 5-12cm >> good control of AHI on f/u download ONO on CPAP:       Osteoarthritis    PCOS (polycystic ovarian syndrome)    PMB (postmenopausal bleeding)    SUI (stress urinary incontinence, female)    h/o   Wears glasses     MEDS:   Current Outpatient Medications on File Prior to Visit  Medication Sig Dispense Refill   alendronate  (FOSAMAX ) 70 MG tablet Take 1 tablet (70 mg total) by mouth every 7 (seven) days. Take first thing in am with 6 oz. Water.  Be upright after taking.  Eat nothing for one hour. 13 tablet 3   atorvastatin  (LIPITOR) 40 MG tablet Take 1 tablet (40 mg total) by mouth daily. 90 tablet 3   CALCIUM  PO Take 1 tablet by mouth at bedtime.      chlorthalidone  (HYGROTON ) 25 MG tablet TAKE 1 TABLET DAILY 90 tablet 3   diltiazem  (CARDIZEM  CD) 180 MG  24 hr capsule TAKE 1 CAPSULE DAILY 90 capsule 3   diltiazem  (CARDIZEM ) 30 MG tablet 1 tablet every 6 hours for palpitations 60 tablet 3   Doxepin  HCl 3 MG TABS TAKE 1 TABLET (3 MG TOTAL) BY MOUTH AT BEDTIME AS NEEDED. 30 tablet 2   estradiol  (ESTRACE ) 0.1 MG/GM vaginal cream INSERT 1 GM VAGINALLY TWICE WEEKLY 42.5 g 3   gabapentin  (NEURONTIN ) 100 MG capsule Take 2 capsules (200 mg total) by mouth at bedtime. 180 capsule 0   gabapentin  (NEURONTIN ) 400 MG capsule TAKE 1 CAPSULE TWICE A DAY 180 capsule 3   GEMTESA  75 MG TABS Take 1 tablet (75 mg total) by mouth daily as needed. 90 tablet 3   metoprolol  succinate (TOPROL -XL) 50 MG 24 hr tablet TAKE 1 TABLET DAILY 90 tablet 2   Multiple Vitamins-Minerals (PRESERVISION AREDS 2 PO) Take 1 tablet by mouth 2 (two) times daily.     Probiotic Product (PROBIOTIC PO) Take by mouth.     Suzetrigine  (JOURNAVX ) 50 MG TABS Take 50 mg by mouth 2 (two) times daily. 180 tablet 0   SYNTHROID  125 MCG tablet Take 1 tablet (125 mcg total) by mouth daily before breakfast. Dosage change 90 tablet 3  XARELTO  20 MG TABS tablet TAKE 1 TABLET DAILY WITH SUPPER 90 tablet 3   ZEPBOUND  15 MG/0.5ML Pen Inject 15 mg into the skin once a week. 6 mL 0   No current facility-administered medications on file prior to visit.    ALLERGIES: Neomycin and Ace inhibitors  SH:  married, non smoker  Review of Systems  Constitutional: Negative.   Genitourinary:        PMP bleeding    PHYSICAL EXAMINATION:    BP 124/67   Pulse 67   Ht 4' 11 (1.499 m)   Wt 184 lb (83.5 kg)   LMP 02/12/2011   SpO2 100%   BMI 37.16 kg/m     General appearance: alert, cooperative and appears stated age Lymph:  no inguinal LAD noted  Pelvic: External genitalia:  no lesions              Urethra:  normal appearing urethra with no masses, tenderness or lesions              Bartholins and Skenes: normal                 Vagina: normal mucosa without prolapse or lesions              Cervix: no  lesions              Bimanual Exam:  Uterus:  normal size, contour, position, consistency, mobility, non-tender              Adnexa: no mass, fullness, tenderness   Assessment/Plan: 1. Postmenopausal bleeding (Primary) - she is going to return for ultrasound and then we will decide if endometrial biopsy is needed - US  PELVIS TRANSVAGINAL NON-OB (TV ONLY); Future  2. Urinary frequency - POCT Urinalysis Dipstick (Automated)

## 2024-07-26 LAB — CUP PACEART REMOTE DEVICE CHECK
Date Time Interrogation Session: 20250823230557
Implantable Pulse Generator Implant Date: 20220620

## 2024-07-27 ENCOUNTER — Ambulatory Visit: Payer: Self-pay | Admitting: Cardiology

## 2024-07-27 ENCOUNTER — Encounter (HOSPITAL_BASED_OUTPATIENT_CLINIC_OR_DEPARTMENT_OTHER): Payer: Self-pay

## 2024-07-27 ENCOUNTER — Encounter (HOSPITAL_BASED_OUTPATIENT_CLINIC_OR_DEPARTMENT_OTHER): Payer: Self-pay | Admitting: Physical Therapy

## 2024-07-27 ENCOUNTER — Ambulatory Visit (HOSPITAL_BASED_OUTPATIENT_CLINIC_OR_DEPARTMENT_OTHER): Admitting: Obstetrics & Gynecology

## 2024-07-27 ENCOUNTER — Other Ambulatory Visit (HOSPITAL_BASED_OUTPATIENT_CLINIC_OR_DEPARTMENT_OTHER)

## 2024-07-27 DIAGNOSIS — N950 Postmenopausal bleeding: Secondary | ICD-10-CM

## 2024-07-27 DIAGNOSIS — M5416 Radiculopathy, lumbar region: Secondary | ICD-10-CM | POA: Diagnosis not present

## 2024-07-28 ENCOUNTER — Ambulatory Visit (HOSPITAL_BASED_OUTPATIENT_CLINIC_OR_DEPARTMENT_OTHER): Payer: Self-pay | Admitting: Physical Therapy

## 2024-07-28 ENCOUNTER — Encounter (HOSPITAL_BASED_OUTPATIENT_CLINIC_OR_DEPARTMENT_OTHER): Payer: Self-pay | Admitting: Physical Therapy

## 2024-07-28 DIAGNOSIS — R29898 Other symptoms and signs involving the musculoskeletal system: Secondary | ICD-10-CM

## 2024-07-28 DIAGNOSIS — M5416 Radiculopathy, lumbar region: Secondary | ICD-10-CM | POA: Diagnosis not present

## 2024-07-28 DIAGNOSIS — M6281 Muscle weakness (generalized): Secondary | ICD-10-CM

## 2024-07-28 NOTE — Therapy (Signed)
 OUTPATIENT PHYSICAL THERAPY THORACOLUMBAR TREATMENT   Patient Name: Nichole Smith MRN: [id number] DOB:September 22, 1956, 68 y.o., female Today's Date: 07/29/2024  END OF SESSION: PT End of Session - 07/28/2024    Visit Number 5   Number of Visits 8   Date for PT Re-Evaluation 08/11/2024   Authorization Type MCR A&B   PT Start Time  1541   PT Stop Time 1558   PT Time Calculation (min) 17 min   Activity Tolerance Patient tolerated treatment well    Behavior During Therapy  [facility] for tasks assessed/performed     Past Medical History:  Diagnosis Date   Arthritis    knees, back   Bursitis    hips - almost gone per pt    Carcinoid tumor of lung 2012   right found incidentally on chest xray eval for r/o vasxculitis    Coronary artery calcification 07/23/2016   Dysrhythmia    irregular occassional, paf  no current problem   Frequent UTI    Hypertension    Hypothyroidism    Infertility, female    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  Download 08/2014:  Good compliance, breakthru apnea on 10cm.  Changed to auto 5-12cm >> good control of AHI on f/u download ONO on CPAP:       Osteoarthritis    PCOS (polycystic ovarian syndrome)    PMB (postmenopausal bleeding)    SUI (stress urinary incontinence, female)    h/o   Wears glasses    Past Surgical History:  Procedure Laterality Date   ANTERIOR CERVICAL DECOMP/DISCECTOMY FUSION  12/24/2017   Procedure: Cervical five-six Cervical six-seven Anterior cervical decompression/discectomy/fusion;  Surgeon: Unice Pac, MD;  Location: [facility] OR;  Service: Neurosurgery;;   CARPAL TUNNEL RELEASE Left 12/24/2017   Procedure: LEFT CARPAL TUNNEL RELEASE;  Surgeon: Unice Pac, MD;  Location: [facility] OR;  Service: Neurosurgery;  Laterality: Left;   CATARACT EXTRACTION Bilateral    Right eye 08/24/2023   CESAREAN SECTION     x 1 - twins   COLONOSCOPY  2021    DILATATION & CURRETTAGE/HYSTEROSCOPY WITH RESECTOCOPE N/A 07/18/2013   Procedure: DILATATION & CURETTAGE/HYSTEROSCOPY WITH RESECTOCOPE;  Surgeon: Ronal Elvie Pinal, MD;  Location: [facility];  Service: Gynecology;  Laterality: N/A;  mass resection   HYSTEROSCOPY WITH D & C N/A 05/05/2023   Procedure: DILATATION AND CURETTAGE /HYSTEROSCOPY;  Surgeon: Pinal Ronal RAMAN, MD;  Location: [facility];  Service: Gynecology;  Laterality: N/A;   implantable loop recorder placement  05/20/2021   [employer]onic Reveal Linq model [id number] implantable loop recorder (SN [id number])   Lung tumor removed  2012   Rt lung carcinoid   MOUTH SURGERY     pre-cancerous ulcer removed   POLYPECTOMY     SHOULDER OPEN ROTATOR CUFF REPAIR  07/20/2010   Right   WISDOM TOOTH EXTRACTION     Patient Active Problem List   Diagnosis Date Noted   Chronic anticoagulation 10/20/2023   History of colonic polyps 10/20/2023   Urinary incontinence, mixed 10/19/2023   Pelvic organ prolapse quantification stage 2 cystocele 10/19/2023   Paroxysmal atrial fibrillation (HCC) 08/07/2023   Hypercoagulable state due to paroxysmal atrial fibrillation (HCC) 08/07/2023   Polyphagia 07/28/2023   Cataract of both eyes 07/28/2023   Obesity, Starting BMI 46.48 07/28/2023   BMI 38.0-38.9,adult 05/05/2023   Right rotator cuff tear arthropathy 04/29/2023   Recurrent UTI 03/29/2023   Postmenopausal bleeding  03/29/2023   Greater trochanteric bursitis, left 03/04/2023   SOBOE (shortness of breath on exertion) 10/14/2022   Peroneal tendon tear, right, initial encounter 10/07/2022   Avulsion fracture of lateral malleolus 09/09/2022   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   Osteopenia of both hips 08/10/2022   Dermatofibroma 07/25/2022   Digital mucinous cyst of finger of right hand 07/25/2022   Sun-damaged skin 07/25/2022   Gluteal tendonitis of right buttock 06/19/2021   Greater trochanteric bursitis of right hip 05/08/2021   Chronic idiopathic  constipation 05/01/2021   Lumbar radiculopathy 04/04/2021   Healthcare maintenance 12/25/2020   Insulin  resistance 03/05/2020   Vitamin D  deficiency 08/25/2019   Prediabetes 08/25/2019   Acute pain of right knee 09/28/2018   Lichen planus 04/30/2018   Cervical stenosis of spinal canal 12/24/2017   Hyperlipidemia LDL goal <70 08/26/2017   Heart palpitations 08/26/2017   Hammertoe of right foot 01/06/2017   Coronary artery calcification 07/23/2016   Periodic limb movements of sleep 06/18/2016   Atherosclerotic plaque 06/11/2016   Piriformis syndrome of left side 10/24/2015   Loss of transverse plantar arch of left foot 09/12/2015   Thyroiditis, autoimmune 02/26/2015   Obstructive sleep apnea 07/27/2014   Midline low back pain without sciatica 06/20/2014   Polycythemia, secondary 03/22/2014   Severe obesity (BMI >= 40) (HCC) 02/22/2014   Elevated hemoglobin (HCC) 02/22/2014   Left shoulder pain 02/04/2013   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   HNP (herniated nucleus pulposus), cervical 01/22/2011   Visit for preventive health examination 01/22/2011   PAIN IN JOINT, MULTIPLE SITES 01/21/2010   Hypothyroidism 10/16/2008   Class 3 severe obesity with serious comorbidity and body mass index (BMI) of 45.0 to 49.9 in adult 01/07/2008   PALPITATIONS, RECURRENT 01/07/2008   Essential hypertension 06/25/2007   Osteoarthritis 06/25/2007     REFERRING PROVIDER: Claudene Arthea HERO, DO   REFERRING DIAG: F45.83 (ICD-10-CM) - Lumbar radiculopathy   Rationale for Evaluation and Treatment: Rehabilitation  THERAPY DIAG:  Radiculopathy, lumbar region  Muscle weakness (generalized)  Other symptoms and signs involving the musculoskeletal system  ONSET DATE: Exacerbation December 2024  SUBJECTIVE:                                                                                                                                                                                           SUBJECTIVE  STATEMENT:  Pt states her back is doing so much better.  Pt states she feels that her L foot drags some and she can hear it scraping the floor.  Pt states she fell last year due to her L foot dragging.    Pt had  a procedure done which was a uterine biopsy yesterday and is sore today.  She doesn't want to perform any core exercises today.     PERTINENT HISTORY:  -Arthritis--knees, back.  Hx of stenosis.  Lumbar anterolisthesis and mild retrolisthesis. -Bilat Hip bursitis -A-fib, HTN, PCOS -osteoporosis which has changed to osteopenia  -PSHx:   Cervical fusion 2019, Loop recorder implant 2022, R RCR 2011, L carpal tunnel release 2019, and Lung tumor removed 2012   PAIN:  NPRS:  6/10 current, 1/10 best, 9/10 worst Location:  R sided lower lumbar which travels to R glute and around to R ant thigh  Aggravating factors:  standing 10-15 mins Easing factors:  lying down, sitting, bending  PRECAUTIONS: Other: lumbar anterolisthesis and mild retrolisthesis, A-fib, loop recorder implant, cervical fusion, osteopenia   WEIGHT BEARING RESTRICTIONS: No  FALLS:  Has patient fallen in last 6 months? Yes. Number of falls 2  LIVING ENVIRONMENT: Lives with: lives with their spouse Lives in: 1 story home Stairs: no; 1 step to enter home but doesn't have to use that step to enter   OCCUPATION: retired  PLOF:  Pt is independent with mobility, ambulation, and daily activities.  She does have someone that helps her clean her home.  PATIENT GOALS: wants to make sure she is doing the right exercises and not doing things that hurt her back.  Pt wants to be able to get up from the floor.  Better balance.  Improve QOL and avoid back surgery.  Pt wants to be able to perform star s   OBJECTIVE:  Note: Objective measures were completed at Evaluation unless otherwise noted.  DIAGNOSTIC FINDINGS:  Lumbar MRI: FINDINGS: Segmentation: There are five lumbar type vertebral bodies. The last full  intervertebral disc space is labeled L5-S1. This correlates with the prior study.   Alignment: Stable degenerative anterolisthesis of L3 and mild retrolisthesis of L1.   Vertebrae: No fracture, evidence of discitis, or bone lesion. Endplate reactive changes at L1-2 and L2-3.   Conus medullaris and cauda equina: Conus extends to the L1-2 level. Conus and cauda equina appear normal.   Paraspinal and other soft tissues: No significant paraspinal or retroperitoneal findings.   Disc levels:   T12-L1: No significant findings.   L1-2: Mild degenerative retrolisthesis of L1 with a bulging degenerated annulus, osteophytic ridging and facet disease contributing to mild spinal and bilateral lateral recess stenosis, right greater than left. There is also a E broad-based right-sided disc osteophyte complex potentially affecting the extraforaminal right L1 nerve root. These findings appear relatively stable to slightly progressive.   L2-3: Anterolisthesis of L3 with a bulging uncovered disc and severe disc degeneration. There is also osteophytic ridging and facet disease all contributing to moderate spinal and bilateral lateral recess stenosis. Mild foraminal stenosis bilaterally. These findings are slightly progressive since the prior study.   L3-4: Bulging uncovered disc, osteophytic ridging, facet disease and ligamentum flavum thickening contributing to mild to moderate spinal stenosis and moderate bilateral lateral recess stenosis, right greater than left. Stable mild bilateral foraminal stenosis, right greater than left. These findings appear stable.   L4-5: Bulging degenerated annulus, short pedicles, facet disease and ligamentum flavum thickening contributing to moderate spinal and bilateral lateral recess stenosis. Mild foraminal stenosis bilaterally but no significant change.   L5-S1: Significant facet disease, left greater than right but no disc protrusions, spinal or  foraminal stenosis.   IMPRESSION: 1. Degenerative lumbar spondylosis with multilevel disc disease and facet disease. Multilevel multifactorial spinal, lateral  recess and foraminal stenosis as discussed above at the individual levels. 2. Moderate multifactorial spinal and bilateral lateral recess stenosis at L2-3. Mild foraminal stenosis bilaterally. These findings are slightly progressive since the prior study. 3. Stable mild to moderate spinal stenosis and moderate bilateral lateral recess stenosis, right greater than left at L3-4. Stable mild bilateral foraminal stenosis, right greater than left. 4. Stable moderate spinal and bilateral lateral recess stenosis at L4-5. Mild foraminal stenosis bilaterally but no significant change. 5. Mild spinal and bilateral lateral recess stenosis, right greater than left at L1-2. There is also a broad-based right-sided disc osteophyte complex potentially affecting the extraforaminal right L1 nerve root. These findings appear relatively stable to slightly progressive.  PATIENT SURVEYS:  Modified Oswestry:  MODIFIED OSWESTRY DISABILITY SCALE   Date: 05/31/2024 Score 07/14/24  Pain intensity 4 =  Pain medication provides me with little relief from pain. 1  2. Personal care (washing, dressing, etc.) 0 =  I can take care of myself normally without causing increased pain. 0  3. Lifting 3 = Pain prevents me from lifting heavy weights, but I can manage (5) I have hardly any social life because of my pain. light to medium weights if they are conveniently positioned 3  4. Walking 1 = Pain prevents me from walking more than 1 mile. 1  5. Sitting 1 =  I can only sit in my favorite chair as long as I like. 2  6. Standing 1 =  I can stand as long as I want but, it increases my pain. 4  7. Sleeping 2 =  Even when I take pain medication, I sleep less than 6 hours 1  8. Social Life 2 = Pain prevents me from participating in more energetic activities (eg. sports,  dancing). 2  9. Traveling 1 =  I can travel anywhere, but it increases my pain. 0  10. Employment/ Homemaking 1 = My normal homemaking/job activities increase my pain, but I can still perform all that is required of me 2  Total 16/50= 32% 15/50= 30%   Interpretation of scores: Score Category Description  0-20% Minimal Disability The patient can cope with most living activities. Usually no treatment is indicated apart from advice on lifting, sitting and exercise  21-40% Moderate Disability The patient experiences more pain and difficulty with sitting, lifting and standing. Travel and social life are more difficult and they may be disabled from work. Personal care, sexual activity and sleeping are not grossly affected, and the patient can usually be managed by conservative means  41-60% Severe Disability Pain remains the main problem in this group, but activities of daily living are affected. These patients require a detailed investigation  61-80% Crippled Back pain impinges on all aspects of the patient's life. Positive intervention is required  81-100% Bed-bound  These patients are either bed-bound or exaggerating their symptoms  Bluford FORBES Zoe DELENA Karon DELENA, et al. Surgery versus conservative management of stable thoracolumbar fracture: the PRESTO feasibility RCT. [place] ([place]): [employer]; 2021 Nov. [facility] Technology Assessment, No. 25.62.) Appendix 3, Oswestry Disability Index category descriptors. Available from: FindJewelers.cz  Minimally Clinically Important Difference (MCID) = 12.8%  COGNITION: Overall cognitive status: Within functional limits for tasks assessed     LUMBAR ROM:   AROM eval 8/14  Flexion WFL   Extension    Right lateral flexion 90% 90% with pain  Left lateral flexion 90% with pain 90% with pain  Right rotation [facility] WFL  Left rotation 80%  WFL   (Blank rows = not tested)   LOWER EXTREMITY MMT:    MMT Right eval  Left eval Right 8/14 Left 8/14  Hip flexion Tolerated slight resistance 4/5 5/5 5/5  Hip extension      Hip abduction 3/5 4+/5 4-/5 4+/5  Hip adduction      Hip internal rotation      Hip external rotation 4-/5 5/5 4+/5   Knee flexion      Knee extension 5/5 5/5    Ankle dorsiflexion      Ankle plantarflexion      Ankle inversion      Ankle eversion       (Blank rows = not tested)   TREATMENT:                                                                             07/14/24:  Reviewed pt presentation.    PT assessed gait.  Pt has decreased foot clearance on L.   PT reviewed HEP and educated pt in exercises.   PT answered pt's questions.      PATIENT EDUCATION:  Education details: dx, exercise form, gait, relevant anatomy, POC, rationale of interventions, and HEP.  PT answered pt's questions.   Person educated: Patient Education method: Medical illustrator, verbal and tactile cues, handout Education comprehension: verbalized understanding and needs further education, verbal and tactile cues required  HOME EXERCISE PROGRAM: Access Code: [email or web address] URL: [email or web address] Date: 06/21/2024 Prepared by: Mose Minerva  Exercises - Supine Transversus Abdominis Bracing - Hands on Stomach  - 2 x daily - 7 x weekly - 2 sets - 10 reps - 5 seconds hold - Supine Bridge  - 1 x daily - 6-7 x weekly - 2 sets - 10 reps - Hooklying Clamshell with Resistance  - 1 x daily - 4-5 x weekly - 2 sets - 10 reps - Standing Glute/piriformis Mobilization with Small Ball on Wall  - Supine alt UE/LE 1x/day 2x10 - Supine Core Control with Leg Extension  - 1 x daily - 7 x weekly - 2 sets - 10 reps - Supine Active Straight Leg Raise  - 1 x daily - 5 x weekly - 2 sets - 10 reps - Clamshell  - 1 x daily - 5-7 x weekly - 2 sets - 10 reps - Standing Shoulder Row with Anchored Resistance  - 1 x daily - 5 x weekly - 2 sets - 10 reps - Standing Anti-Rotation Press with Anchored  Resistance  - 1 x daily - 5 x weekly - 2 sets - 10 reps  ASSESSMENT:  CLINICAL IMPRESSION: Pt had a procedure performed yesterday and is sore today.  Pt didn't want to perform exercises today due to prior procedure and PT agreed.  Pt states her back is doing so much better.  Pt reporting feeling her L foot drag some which is what caused her to fall last year.  PT observed gait and pt does have decreased L foot clearance.  PT educated pt concerning her gait and answered her questions.  PT reviewed her HEP and educated pt concerning her home exercises.  She should benefit from continued skilled PT services to address impairments, establish  independence with HEP, and to improve overall function.       OBJECTIVE IMPAIRMENTS: Abnormal gait, decreased activity tolerance, decreased endurance, decreased mobility, difficulty walking, decreased ROM, decreased strength, hypomobility, increased fascial restrictions, and pain.   ACTIVITY LIMITATIONS: lifting, standing, stairs, and locomotion level  PARTICIPATION LIMITATIONS: cleaning, shopping, and community activity  PERSONAL FACTORS: Time since onset of injury/illness/exacerbation and 3+ comorbidities: arthritis, bilat hip bursitis, cervical fusion, A-fib are also affecting patient's functional outcome.   REHAB POTENTIAL: Good  CLINICAL DECISION MAKING: Stable/uncomplicated  EVALUATION COMPLEXITY: Low   GOALS:   SHORT TERM GOALS: Target date:  06/21/2024   Pt will perform home exercises without adverse effects for improved pain, strength, ROM, and function. Baseline: Goal status: GOAL MET  8/14  2.  Pt will report improved tolerance with daily activities and daily mobility including in the afternoon.   Baseline:  Goal status: ONGOING  3.  Pt will progress with core exercises without adverse effects for improved core stabilization for improved ease and reduced pain with daily activities and household chores.  Baseline:  Goal status: GOAL  MET  8/14    LONG TERM GOALS: Target date:  08/11/2024   Pt will be independent and compliant with HEP for improved pain, ROM, strength, and function.  Baseline:  Goal status: PROGRESSING  2.  Pt will demo improved R LE strength to = L LE strength for improved tolerance with and performance of daily activities and functional mobility.  Baseline:  Goal status: PROGRESSING  3.  Pt will report she is able to ambulate her normal community distance without significant pain.   Baseline:  Goal status: ONGONG  4.  Pt will demo improved lumbar AROM to be [facility] for bilat SB'ing and L rotation for improved stiffness and daily mobility.   Baseline:  Goal status: 33%  MET   8/14    PLAN:  PT FREQUENCY: 1x/week  PT DURATION: 4 weeks     PLANNED INTERVENTIONS: [id number]- PT Re-evaluation, 97750- Physical Performance Testing, 97110-Therapeutic exercises, 97530- Therapeutic activity, [id number]- Neuromuscular re-education, 97535- Self Care, [id number]- Manual therapy, [id number]- Gait training, [phone]- Aquatic Therapy, [phone]- Ultrasound, Patient/Family education, Stair training, Taping, Cryotherapy, and Moist heat.  PLAN FOR NEXT SESSION: STM to glutes.  Progress HEP.  Core and LE strengthening.      Leigh Minerva III PT, DPT 07/29/24 4:49 PM

## 2024-07-29 LAB — SURGICAL PATHOLOGY

## 2024-07-30 ENCOUNTER — Encounter (HOSPITAL_BASED_OUTPATIENT_CLINIC_OR_DEPARTMENT_OTHER): Payer: Self-pay | Admitting: Obstetrics & Gynecology

## 2024-07-30 NOTE — Progress Notes (Signed)
   Ultrasound f/u Patient name: Nichole Smith MRN [id number]  Date of birth: 31-Mar-1956 Chief Complaint:   PMP bleeding  History of Present Illness:   Nichole Smith is a 68 y.o. G69P1003 Caucasian female being seen today for discussion of ultrasound findings and for endometrial biopsy given PMP bleeding. She has experienced this in the past.  She underwent hysteroscopy last year due to recurrent bleeding.  Has not had any since that time.  She is now on Xarelto .  Patient's last menstrual period was 02/12/2011.  Last pap 12/26/2022. Results were: negative.  Review of Systems:   Pertinent items are noted in HPI Denies any urinary or bowel changes Pertinent History Reviewed:  Reviewed past medical,surgical, social and family history.  Reviewed problem list, medications and allergies. Physical Assessment:         Physical Examination:   General appearance - well appearing, and in no distress  Mental status - alert, oriented to person, place, and time  Psych:  She has a normal mood and affect   Abdomen - soft, nontender, nondistended, no masses or organomegaly  Pelvic - VULVA: normal appearing vulva with no masses, tenderness or lesions   VAGINA: normal appearing vagina with normal color and discharge, no lesions   CERVIX: normal appearing cervix without discharge or lesions, no CMT   Endometrial biopsy recommended.  Discussed with patient.  Verbal and written consent obtained.   Procedure:  Speculum placed.  Cervix visualized and cleansed with betadine  prep.  A single toothed tenaculum was applied to the anterior lip of the cervix.  Endometrial pipelle was advanced through the cervix into the endometrial cavity without difficulty.  Pipelle passed to 7.5cm.  Suction applied and pipelle removed with good tissue sample obtained.  Tenculum removed.  No bleeding noted.  Patient tolerated procedure well.  Assessment & Plan:  1. Postmenopausal bleeding (Primary) - ultrasound showed no  abnormal findings today - Surgical pathology( [facility])   No orders of the defined types were placed in this encounter.   Meds: No orders of the defined types were placed in this encounter.   [name], MD 07/30/2024 9:35 PM GYNECOLOGY  VISIT

## 2024-08-01 ENCOUNTER — Encounter

## 2024-08-02 ENCOUNTER — Ambulatory Visit (HOSPITAL_BASED_OUTPATIENT_CLINIC_OR_DEPARTMENT_OTHER): Payer: Self-pay | Admitting: Obstetrics & Gynecology

## 2024-08-03 ENCOUNTER — Encounter (HOSPITAL_BASED_OUTPATIENT_CLINIC_OR_DEPARTMENT_OTHER): Admitting: Physical Therapy

## 2024-08-04 ENCOUNTER — Encounter (HOSPITAL_BASED_OUTPATIENT_CLINIC_OR_DEPARTMENT_OTHER): Payer: Self-pay | Admitting: Pulmonary Disease

## 2024-08-04 ENCOUNTER — Ambulatory Visit (HOSPITAL_BASED_OUTPATIENT_CLINIC_OR_DEPARTMENT_OTHER): Admitting: Pulmonary Disease

## 2024-08-04 VITALS — BP 112/68 | HR 79 | Temp 98.2°F | Ht 59.0 in | Wt 181.0 lb

## 2024-08-04 DIAGNOSIS — G4733 Obstructive sleep apnea (adult) (pediatric): Secondary | ICD-10-CM

## 2024-08-04 MED ORDER — DOXEPIN HCL 3 MG PO TABS: 3.0000 mg | ORAL_TABLET | Freq: Every evening | ORAL | 2 refills | Status: AC | PRN

## 2024-08-04 NOTE — Patient Instructions (Signed)
 X change to auto CPAP 10-14 cm  X refill on doxepin   Happy Birthday in advance !

## 2024-08-04 NOTE — Progress Notes (Signed)
 Subjective:    Patient ID: Nichole Smith, female    DOB: 10/24/1956, 68 y.o.   MRN: [id number]  68  y.o. female with obstructive sleep apnea.  -on CPAP 14 cm   PMH : Leukocytoclastic vasculitis, Hypothyroidism, HTN, Coronary calcification, Carcinoid tumor of lung 2012  PAF   Discussed the use of AI scribe software for clinical note transcription with the patient, who gave verbal consent to proceed.  History of Present Illness    Discussed the use of AI scribe software for clinical note transcription with the patient, who gave verbal consent to proceed.  History of Present Illness   Nichole Smith is a 68 year old female with sleep apnea who presents for a regular follow-up to manage her CPAP therapy.  She has been using CPAP therapy for several years and recently switched to a nasal mask, the N30, which she finds more comfortable and suitable for her sleeping position. The mask's smaller size and top-positioned hose allow her to sleep on her stomach without issues.  She has experienced significant weight loss of almost sixty pounds over the last five years due to lifestyle changes and medication. Her basic metabolic rate is 8499, and she consumes about 1200 calories to continue losing weight.  She experiences sleep maintenance insomnia and uses doxepin  occasionally, especially when traveling or anticipating a busy day. It helps her return to sleep after waking up during the night. She wakes up to use the bathroom but returns to sleep quickly with the medication. Melatonin was ineffective for her.  She experiences dry mouth and dry eyes, which she attributes to medication use. Despite these symptoms, she feels her overall health has improved.       Significant tests/ events reviewed  PSG 07/10/14 >> AHI 85 ONO with CPAP 04/12/18 >> test time 6 hrs 34 min.  Average SpO2 90%, low SpO2 82%.  Spent 43.5 min with SpO2 < 88%. ONO with CPAP 06/08/18 >> test time 8 hrs 42 min.  Basal  SpO2 93.6%, low SpO2 81%.  Spent 4.3 min with SpO2 < 88%. ONO with CPAP 06/02/23 >> test time 7 hrs 43 min. Baseline SpO2 92%, low SpO2 85%. Spent 8 min 38 sec with SpO2 < 88%.  2/20221 CT cors  -old granulomatous dz  Review of Systems  neg for any significant sore throat, dysphagia, itching, sneezing, nasal congestion or excess/ purulent secretions, fever, chills, sweats, unintended wt loss, pleuritic or exertional cp, hempoptysis, orthopnea pnd or change in chronic leg swelling. Also denies presyncope, palpitations, heartburn, abdominal pain, nausea, vomiting, diarrhea or change in bowel or urinary habits, dysuria,hematuria, rash, arthralgias, visual complaints, headache, numbness weakness or ataxia.      Objective:   Physical Exam  Gen. Pleasant, obese, in no distress ENT - no lesions, no post nasal drip Neck: No JVD, no thyromegaly, no carotid bruits Lungs: no use of accessory muscles, no dullness to percussion, decreased without rales or rhonchi  Cardiovascular: Rhythm regular, heart sounds  normal, no murmurs or gallops, no peripheral edema Musculoskeletal: No deformities, no cyanosis or clubbing , no tremors       Assessment & Plan:   Assessment and Plan Assessment & Plan   Assessment and Plan    Obstructive sleep apnea managed with CPAP therapy Obstructive sleep apnea is well-managed with CPAP therapy. She is compliant, using the CPAP machine nightly for an average of 7.5 hours. Her apnea-hypopnea index (AHI) decreased from 85 to 0.5 events per hour, indicating  excellent control. Mask leakage improved with a new mask. Current pressure setting is 14 cm H2O, with potential adjustment due to weight loss. CPAP significantly reduced daytime somnolence and fatigue. - Change CPAP settings to auto mode with a range of 10 to 14 cm H2O.  Sleep maintenance insomnia Sleep maintenance insomnia is managed with doxepin , used as needed, especially when traveling or anticipating a busy day.  Doxepin  effectively aids return to sleep after nocturnal awakenings. She is hesitant to use it nightly but finds it effective when used. Melatonin was discussed as an alternative but was ineffective for her. She is informed about the relative safety of doxepin  compared to other medications for her age group. - Refill doxepin  prescription.

## 2024-08-04 NOTE — Addendum Note (Signed)
 Addended by: Sonam Huelsmann L on: 08/04/2024 12:57 PM   Modules accepted: Orders

## 2024-08-09 ENCOUNTER — Ambulatory Visit (HOSPITAL_BASED_OUTPATIENT_CLINIC_OR_DEPARTMENT_OTHER)

## 2024-08-09 VITALS — BP 114/59 | HR 70 | Wt 183.6 lb

## 2024-08-09 DIAGNOSIS — R399 Unspecified symptoms and signs involving the genitourinary system: Secondary | ICD-10-CM

## 2024-08-09 LAB — POCT URINALYSIS DIP (CLINITEK)
Bilirubin, UA: NEGATIVE
Blood, UA: NEGATIVE
Glucose, UA: NEGATIVE mg/dL
Ketones, POC UA: NEGATIVE mg/dL
Nitrite, UA: NEGATIVE
POC PROTEIN,UA: NEGATIVE
Spec Grav, UA: 1.015 (ref 1.010–1.025)
Urobilinogen, UA: 0.2 U/dL
pH, UA: 7 (ref 5.0–8.0)

## 2024-08-09 MED ORDER — NITROFURANTOIN MONOHYD MACRO 100 MG PO CAPS: 100.0000 mg | ORAL_CAPSULE | Freq: Two times a day (BID) | ORAL | 0 refills | Status: DC

## 2024-08-09 MED ORDER — PHENAZOPYRIDINE HCL 200 MG PO TABS: 200.0000 mg | ORAL_TABLET | Freq: Three times a day (TID) | ORAL | 0 refills | Status: DC | PRN

## 2024-08-09 NOTE — Progress Notes (Signed)
 NURSE VISIT- UTI SYMPTOMS   SUBJECTIVE:  Nichole Smith is a 68 y.o. G63P1003 female here for UTI symptoms. She is a GYN patient. She reports dysuria, urinary frequency, and urinary urgency.  OBJECTIVE:  BP (!) 114/59 (BP Location: Right Arm, Patient Position: Sitting, Cuff Size: Normal)   Pulse 70   Wt 183 lb 9.6 oz (83.3 kg)   LMP 02/12/2011   SpO2 100%   BMI 37.08 kg/m   Appears well, in no apparent distress.  Results for orders placed or performed in visit on 08/09/24 (from the past 24 hours)  POCT URINALYSIS DIP (CLINITEK)   Collection Time: 08/09/24 10:50 AM  Result Value Ref Range   Color, UA yellow yellow   Clarity, UA clear clear   Glucose, UA negative negative mg/dL   Bilirubin, UA negative negative   Ketones, POC UA negative negative mg/dL   Spec Grav, UA 8.984 8.989 - 1.025   Blood, UA negative negative   pH, UA 7.0 5.0 - 8.0   POC PROTEIN,UA negative negative, trace   Urobilinogen, UA 0.2 0.2 or 1.0 E.U./dL   Nitrite, UA Negative Negative   Leukocytes, UA Trace (A) Negative   *Note: Due to a large number of results and/or encounters for the requested time period, some results have not been displayed. A complete set of results can be found in Results Review.    ASSESSMENT: GYN patient with UTI symptoms and negative nitrites.  PLAN: Visit routed to or discussed with: Dr.Miller Rx sent today: Yes Urine culture sent Call or return to clinic prn if these symptoms worsen or fail to improve as anticipated. Follow-up: as needed

## 2024-08-10 ENCOUNTER — Encounter (HOSPITAL_BASED_OUTPATIENT_CLINIC_OR_DEPARTMENT_OTHER): Payer: Self-pay | Admitting: Physical Therapy

## 2024-08-10 ENCOUNTER — Ambulatory Visit (HOSPITAL_BASED_OUTPATIENT_CLINIC_OR_DEPARTMENT_OTHER): Attending: Family Medicine | Admitting: Physical Therapy

## 2024-08-10 ENCOUNTER — Ambulatory Visit (INDEPENDENT_AMBULATORY_CARE_PROVIDER_SITE_OTHER): Admitting: Physician Assistant

## 2024-08-10 ENCOUNTER — Ambulatory Visit (INDEPENDENT_AMBULATORY_CARE_PROVIDER_SITE_OTHER): Admitting: Family Medicine

## 2024-08-10 DIAGNOSIS — M5416 Radiculopathy, lumbar region: Secondary | ICD-10-CM | POA: Diagnosis present

## 2024-08-10 DIAGNOSIS — M6281 Muscle weakness (generalized): Secondary | ICD-10-CM | POA: Insufficient documentation

## 2024-08-10 DIAGNOSIS — R29898 Other symptoms and signs involving the musculoskeletal system: Secondary | ICD-10-CM | POA: Insufficient documentation

## 2024-08-10 NOTE — Therapy (Signed)
 OUTPATIENT PHYSICAL THERAPY THORACOLUMBAR TREATMENT   Patient Name: Nichole Smith MRN: [id number] DOB:12/31/1955, 68 y.o., female Today's Date: 08/11/2024  END OF SESSION: PT End of Session - 08/10/2024    Visit Number 6   Number of Visits 8   Date for PT Re-Evaluation 08/11/2024   Authorization Type MCR A&B   PT Start Time  1025   PT Stop Time 1108   PT Time Calculation (min) 43 min   Activity Tolerance Patient tolerated treatment well    Behavior During Therapy  WFL for tasks assessed/performed     Past Medical History:  Diagnosis Date   Arthritis    knees, back   Bursitis    hips - almost gone per pt    Carcinoid tumor of lung 2012   right found incidentally on chest xray eval for r/o vasxculitis    Coronary artery calcification 07/23/2016   Dysrhythmia    irregular occassional, paf  no current problem   Frequent UTI    Hypertension    Hypothyroidism    Infertility, female    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  Download 08/2014:  Good compliance, breakthru apnea on 10cm.  Changed to auto 5-12cm >> good control of AHI on f/u download ONO on CPAP:       Osteoarthritis    PCOS (polycystic ovarian syndrome)    PMB (postmenopausal bleeding)    SUI (stress urinary incontinence, female)    h/o   Wears glasses    Past Surgical History:  Procedure Laterality Date   ANTERIOR CERVICAL DECOMP/DISCECTOMY FUSION  12/24/2017   Procedure: Cervical five-six Cervical six-seven Anterior cervical decompression/discectomy/fusion;  Surgeon: Unice Pac, MD;  Location: [facility] OR;  Service: Neurosurgery;;   CARPAL TUNNEL RELEASE Left 12/24/2017   Procedure: LEFT CARPAL TUNNEL RELEASE;  Surgeon: Unice Pac, MD;  Location: [facility] OR;  Service: Neurosurgery;  Laterality: Left;   CATARACT EXTRACTION Bilateral    Right eye 08/24/2023   CESAREAN SECTION     x 1 - twins   COLONOSCOPY  2021    DILATATION & CURRETTAGE/HYSTEROSCOPY WITH RESECTOCOPE N/A 07/18/2013   Procedure: DILATATION & CURETTAGE/HYSTEROSCOPY WITH RESECTOCOPE;  Surgeon: Ronal Elvie Pinal, MD;  Location: [facility];  Service: Gynecology;  Laterality: N/A;  mass resection   HYSTEROSCOPY WITH D & C N/A 05/05/2023   Procedure: DILATATION AND CURETTAGE /HYSTEROSCOPY;  Surgeon: Pinal Ronal RAMAN, MD;  Location: [facility];  Service: Gynecology;  Laterality: N/A;   implantable loop recorder placement  05/20/2021   [employer] Reveal Linq model [id number] implantable loop recorder (SN [id number])   Lung tumor removed  2012   Rt lung carcinoid   MOUTH SURGERY     pre-cancerous ulcer removed   POLYPECTOMY     SHOULDER OPEN ROTATOR CUFF REPAIR  07/20/2010   Right   WISDOM TOOTH EXTRACTION     Patient Active Problem List   Diagnosis Date Noted   Chronic anticoagulation 10/20/2023   History of colonic polyps 10/20/2023   Urinary incontinence, mixed 10/19/2023   Pelvic organ prolapse quantification stage 2 cystocele 10/19/2023   Paroxysmal atrial fibrillation (HCC) 08/07/2023   Hypercoagulable state due to paroxysmal atrial fibrillation (HCC) 08/07/2023   Polyphagia 07/28/2023   Cataract of both eyes 07/28/2023   Obesity, Starting BMI 46.48 07/28/2023   BMI 38.0-38.9,adult 05/05/2023   Right rotator cuff tear arthropathy 04/29/2023   Recurrent UTI 03/29/2023   Postmenopausal bleeding  03/29/2023   Greater trochanteric bursitis, left 03/04/2023   SOBOE (shortness of breath on exertion) 10/14/2022   Peroneal tendon tear, right, initial encounter 10/07/2022   Avulsion fracture of lateral malleolus 09/09/2022   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   Osteopenia of both hips 08/10/2022   Dermatofibroma 07/25/2022   Digital mucinous cyst of finger of right hand 07/25/2022   Sun-damaged skin 07/25/2022   Gluteal tendonitis of right buttock 06/19/2021   Greater trochanteric bursitis of right hip 05/08/2021   Chronic idiopathic  constipation 05/01/2021   Lumbar radiculopathy 04/04/2021   Healthcare maintenance 12/25/2020   Insulin  resistance 03/05/2020   Vitamin D  deficiency 08/25/2019   Prediabetes 08/25/2019   Acute pain of right knee 09/28/2018   Lichen planus 04/30/2018   Cervical stenosis of spinal canal 12/24/2017   Hyperlipidemia LDL goal <70 08/26/2017   Heart palpitations 08/26/2017   Hammertoe of right foot 01/06/2017   Coronary artery calcification 07/23/2016   Periodic limb movements of sleep 06/18/2016   Atherosclerotic plaque 06/11/2016   Piriformis syndrome of left side 10/24/2015   Loss of transverse plantar arch of left foot 09/12/2015   Thyroiditis, autoimmune 02/26/2015   Obstructive sleep apnea 07/27/2014   Midline low back pain without sciatica 06/20/2014   Polycythemia, secondary 03/22/2014   Severe obesity (BMI >= 40) (HCC) 02/22/2014   Elevated hemoglobin (HCC) 02/22/2014   Left shoulder pain 02/04/2013   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   HNP (herniated nucleus pulposus), cervical 01/22/2011   Visit for preventive health examination 01/22/2011   PAIN IN JOINT, MULTIPLE SITES 01/21/2010   Hypothyroidism 10/16/2008   Class 3 severe obesity with serious comorbidity and body mass index (BMI) of 45.0 to 49.9 in adult 01/07/2008   PALPITATIONS, RECURRENT 01/07/2008   Essential hypertension 06/25/2007   Osteoarthritis 06/25/2007     REFERRING PROVIDER: Claudene Arthea HERO, DO   REFERRING DIAG: F45.83 (ICD-10-CM) - Lumbar radiculopathy   Rationale for Evaluation and Treatment: Rehabilitation  THERAPY DIAG:  Radiculopathy, lumbar region  Muscle weakness (generalized)  Other symptoms and signs involving the musculoskeletal system  ONSET DATE: Exacerbation December 2024  SUBJECTIVE:                                                                                                                                                                                           SUBJECTIVE  STATEMENT:  Pt states I'm feeling great.  Pt is very pleased with her progress.  Pt reports improved donning shoes    Pt states she is feeling better since having the procedure done and has no restrictions/limitations from the procedure.     PERTINENT HISTORY:  -  Arthritis--knees, back.  Hx of stenosis.  Lumbar anterolisthesis and mild retrolisthesis. -Bilat Hip bursitis -A-fib, HTN, PCOS -osteoporosis which has changed to osteopenia  -PSHx:   Cervical fusion 2019, Loop recorder implant 2022, R RCR 2011, L carpal tunnel release 2019, and Lung tumor removed 2012   PAIN:  NPRS:  2/10 current Location:  L post hip/glute  Aggravating factors:  standing 10-15 mins Easing factors:  lying down, sitting, bending  PRECAUTIONS: Other: lumbar anterolisthesis and mild retrolisthesis, A-fib, loop recorder implant, cervical fusion, osteopenia   WEIGHT BEARING RESTRICTIONS: No  FALLS:  Has patient fallen in last 6 months? Yes. Number of falls 2  LIVING ENVIRONMENT: Lives with: lives with their spouse Lives in: 1 story home Stairs: no; 1 step to enter home but doesn't have to use that step to enter   OCCUPATION: retired  PLOF:  Pt is independent with mobility, ambulation, and daily activities.  She does have someone that helps her clean her home.  PATIENT GOALS: wants to make sure she is doing the right exercises and not doing things that hurt her back.  Pt wants to be able to get up from the floor.  Better balance.  Improve QOL and avoid back surgery.  Pt wants to be able to perform star s   OBJECTIVE:  Note: Objective measures were completed at Evaluation unless otherwise noted.  DIAGNOSTIC FINDINGS:  Lumbar MRI: FINDINGS: Segmentation: There are five lumbar type vertebral bodies. The last full intervertebral disc space is labeled L5-S1. This correlates with the prior study.   Alignment: Stable degenerative anterolisthesis of L3 and mild retrolisthesis of L1.   Vertebrae:  No fracture, evidence of discitis, or bone lesion. Endplate reactive changes at L1-2 and L2-3.   Conus medullaris and cauda equina: Conus extends to the L1-2 level. Conus and cauda equina appear normal.   Paraspinal and other soft tissues: No significant paraspinal or retroperitoneal findings.   Disc levels:   T12-L1: No significant findings.   L1-2: Mild degenerative retrolisthesis of L1 with a bulging degenerated annulus, osteophytic ridging and facet disease contributing to mild spinal and bilateral lateral recess stenosis, right greater than left. There is also a E broad-based right-sided disc osteophyte complex potentially affecting the extraforaminal right L1 nerve root. These findings appear relatively stable to slightly progressive.   L2-3: Anterolisthesis of L3 with a bulging uncovered disc and severe disc degeneration. There is also osteophytic ridging and facet disease all contributing to moderate spinal and bilateral lateral recess stenosis. Mild foraminal stenosis bilaterally. These findings are slightly progressive since the prior study.   L3-4: Bulging uncovered disc, osteophytic ridging, facet disease and ligamentum flavum thickening contributing to mild to moderate spinal stenosis and moderate bilateral lateral recess stenosis, right greater than left. Stable mild bilateral foraminal stenosis, right greater than left. These findings appear stable.   L4-5: Bulging degenerated annulus, short pedicles, facet disease and ligamentum flavum thickening contributing to moderate spinal and bilateral lateral recess stenosis. Mild foraminal stenosis bilaterally but no significant change.   L5-S1: Significant facet disease, left greater than right but no disc protrusions, spinal or foraminal stenosis.   IMPRESSION: 1. Degenerative lumbar spondylosis with multilevel disc disease and facet disease. Multilevel multifactorial spinal, lateral recess and foraminal stenosis  as discussed above at the individual levels. 2. Moderate multifactorial spinal and bilateral lateral recess stenosis at L2-3. Mild foraminal stenosis bilaterally. These findings are slightly progressive since the prior study. 3. Stable mild to moderate spinal stenosis and moderate bilateral lateral  recess stenosis, right greater than left at L3-4. Stable mild bilateral foraminal stenosis, right greater than left. 4. Stable moderate spinal and bilateral lateral recess stenosis at L4-5. Mild foraminal stenosis bilaterally but no significant change. 5. Mild spinal and bilateral lateral recess stenosis, right greater than left at L1-2. There is also a broad-based right-sided disc osteophyte complex potentially affecting the extraforaminal right L1 nerve root. These findings appear relatively stable to slightly progressive.  PATIENT SURVEYS:  Modified Oswestry:  MODIFIED OSWESTRY DISABILITY SCALE   Date: 05/31/2024 Score 07/14/24  Pain intensity 4 =  Pain medication provides me with little relief from pain. 1  2. Personal care (washing, dressing, etc.) 0 =  I can take care of myself normally without causing increased pain. 0  3. Lifting 3 = Pain prevents me from lifting heavy weights, but I can manage (5) I have hardly any social life because of my pain. light to medium weights if they are conveniently positioned 3  4. Walking 1 = Pain prevents me from walking more than 1 mile. 1  5. Sitting 1 =  I can only sit in my favorite chair as long as I like. 2  6. Standing 1 =  I can stand as long as I want but, it increases my pain. 4  7. Sleeping 2 =  Even when I take pain medication, I sleep less than 6 hours 1  8. Social Life 2 = Pain prevents me from participating in more energetic activities (eg. sports, dancing). 2  9. Traveling 1 =  I can travel anywhere, but it increases my pain. 0  10. Employment/ Homemaking 1 = My normal homemaking/job activities increase my pain, but I can still perform all  that is required of me 2  Total 16/50= 32% 15/50= 30%   Interpretation of scores: Score Category Description  0-20% Minimal Disability The patient can cope with most living activities. Usually no treatment is indicated apart from advice on lifting, sitting and exercise  21-40% Moderate Disability The patient experiences more pain and difficulty with sitting, lifting and standing. Travel and social life are more difficult and they may be disabled from work. Personal care, sexual activity and sleeping are not grossly affected, and the patient can usually be managed by conservative means  41-60% Severe Disability Pain remains the main problem in this group, but activities of daily living are affected. These patients require a detailed investigation  61-80% Crippled Back pain impinges on all aspects of the patient's life. Positive intervention is required  81-100% Bed-bound  These patients are either bed-bound or exaggerating their symptoms  Bluford FORBES Zoe DELENA Karon DELENA, et al. Surgery versus conservative management of stable thoracolumbar fracture: the PRESTO feasibility RCT. [place] ([place]): [employer]; 2021 Nov. [facility] Technology Assessment, No. 25.62.) Appendix 3, Oswestry Disability Index category descriptors. Available from: FindJewel[employer].cz  Minimally Clinically Important Difference (MCID) = 12.8%  COGNITION: Overall cognitive status: Within functional limits for tasks assessed     LUMBAR ROM:   AROM eval 8/14  Flexion WFL   Extension    Right lateral flexion 90% 90% with pain  Left lateral flexion 90% with pain 90% with pain  Right rotation [facility]  Left rotation 80% WFL   (Blank rows = not tested)   LOWER EXTREMITY MMT:    MMT Right eval Left eval Right 8/14 Left 8/14 Right 9/10 Left 9/10  Hip flexion Tolerated slight resistance 4/5 5/5 5/5    Hip extension  Hip abduction 3/5 4+/5 4-/5 4+/5    Hip adduction        Hip  internal rotation        Hip external rotation 4-/5 5/5 4+/5     Knee flexion        Knee extension 5/5 5/5      Ankle dorsiflexion     5/5 4/5  Ankle plantarflexion        Ankle inversion        Ankle eversion         (Blank rows = not tested)   TREATMENT:                                                                               PT assessed gait.  Pt has decreased foot clearance on L.  Assessed DF ROM and strength.   DF AROM:  R:  8 deg, L:  7 deg  (knee flexed)  DF with YTB L: 2x10  Supine bridge with clams with GTB with TrA 2x10 Supine SLR with alt UE extension with TrA 2x10 Sidestepping with RTB around thighs with TrA x 3 laps at rail Standing rows with TrA with scap retraction RTB x10, GTB 2x10 Paloff press with TrA with GTB 2x10 each  Reviewed HEP and updated HEP.       PATIENT EDUCATION:  Education details: dx, exercise form, gait, relevant anatomy, POC, rationale of interventions, and HEP.  PT answered pt's questions.   Person educated: Patient Education method: Medical illustrator, verbal and tactile cues, handout Education comprehension: verbalized understanding and needs further education, verbal and tactile cues required  HOME EXERCISE PROGRAM: Access Code: [email or web address] URL: [email or web address] Date: 06/21/2024 Prepared by: Mose Minerva  Exercises - Supine Transversus Abdominis Bracing - Hands on Stomach  - 2 x daily - 7 x weekly - 2 sets - 10 reps - 5 seconds hold - Supine Bridge  - 1 x daily - 6-7 x weekly - 2 sets - 10 reps - Hooklying Clamshell with Resistance  - 1 x daily - 4-5 x weekly - 2 sets - 10 reps - Standing Glute/piriformis Mobilization with Small Ball on Wall  - Supine alt UE/LE 1x/day 2x10 - Supine Core Control with Leg Extension  - 1 x daily - 7 x weekly - 2 sets - 10 reps - Supine Active Straight Leg Raise  - 1 x daily - 5 x weekly - 2 sets - 10 reps - Clamshell  - 1 x daily - 5-7 x weekly - 2 sets - 10  reps - Standing Shoulder Row with Anchored Resistance  - 1 x daily - 5 x weekly - 2 sets - 10 reps - Standing Anti-Rotation Press with Anchored Resistance  - 1 x daily - 5 x weekly - 2 sets - 10 reps  Updated HEP: - Side Stepping with Resistance at Thighs and Counter Support  - 3 x weekly - 2-3 reps  ASSESSMENT:  CLINICAL IMPRESSION: Pt is progressing well with pain and sx's as evidenced by subjective reports.  Pt reports improved pain and improved ability to don shoes.  PT assessed DF ROM and strength.  She has limited bilat DF AROM and  weakness in L ankle DF.  PT had pt perform ankle DF with theraband to improve DF strength.  PT educated pt in how to perform at home.  PT reviewed and answered pt's questions concerning HEP.  PT updated HEP and gave pt a HEP handout.  Pt performed exercises well with cuing and instruction in correct form and positioning.  She demonstrates good tolerance with exercises and reports no increased pain after treatment.  Pt may be ready for discharge next visit.   OBJECTIVE IMPAIRMENTS: Abnormal gait, decreased activity tolerance, decreased endurance, decreased mobility, difficulty walking, decreased ROM, decreased strength, hypomobility, increased fascial restrictions, and pain.   ACTIVITY LIMITATIONS: lifting, standing, stairs, and locomotion level  PARTICIPATION LIMITATIONS: cleaning, shopping, and community activity  PERSONAL FACTORS: Time since onset of injury/illness/exacerbation and 3+ comorbidities: arthritis, bilat hip bursitis, cervical fusion, A-fib are also affecting patient's functional outcome.   REHAB POTENTIAL: Good  CLINICAL DECISION MAKING: Stable/uncomplicated  EVALUATION COMPLEXITY: Low   GOALS:   SHORT TERM GOALS: Target date:  06/21/2024   Pt will perform home exercises without adverse effects for improved pain, strength, ROM, and function. Baseline: Goal status: GOAL MET  8/14  2.  Pt will report improved tolerance with daily  activities and daily mobility including in the afternoon.   Baseline:  Goal status: ONGOING  3.  Pt will progress with core exercises without adverse effects for improved core stabilization for improved ease and reduced pain with daily activities and household chores.  Baseline:  Goal status: GOAL MET  8/14    LONG TERM GOALS: Target date:  08/11/2024   Pt will be independent and compliant with HEP for improved pain, ROM, strength, and function.  Baseline:  Goal status: PROGRESSING  2.  Pt will demo improved R LE strength to = L LE strength for improved tolerance with and performance of daily activities and functional mobility.  Baseline:  Goal status: PROGRESSING  3.  Pt will report she is able to ambulate her normal community distance without significant pain.   Baseline:  Goal status: ONGONG  4.  Pt will demo improved lumbar AROM to be [facility] for bilat SB'ing and L rotation for improved stiffness and daily mobility.   Baseline:  Goal status: 33%  MET   8/14    PLAN:  PT FREQUENCY: 1x/week  PT DURATION: 4 weeks     PLANNED INTERVENTIONS: [id number]- PT Re-evaluation, 97750- Physical Performance Testing, 97110-Therapeutic exercises, 97530- Therapeutic activity, [id number]- Neuromuscular re-education, 97535- Self Care, [id number]- Manual therapy, [id number]- Gait training, [phone]- Aquatic Therapy, [phone]- Ultrasound, Patient/Family education, Stair training, Taping, Cryotherapy, and Moist heat.  PLAN FOR NEXT SESSION:  Review HEP.  Possible discharge next treatment.   Leigh Minerva III PT, DPT 08/12/24 12:00 AM

## 2024-08-11 ENCOUNTER — Ambulatory Visit (HOSPITAL_BASED_OUTPATIENT_CLINIC_OR_DEPARTMENT_OTHER): Payer: Self-pay | Admitting: Obstetrics & Gynecology

## 2024-08-11 LAB — URINE CULTURE

## 2024-08-13 ENCOUNTER — Encounter (HOSPITAL_BASED_OUTPATIENT_CLINIC_OR_DEPARTMENT_OTHER): Payer: Self-pay | Admitting: Physical Therapy

## 2024-08-16 NOTE — Progress Notes (Addendum)
 SUBJECTIVE: Discussed the use of AI scribe software for clinical note transcription with the patient, who gave verbal consent to proceed.  Chief Complaint: Obesity  Interim History: She is down 4 lbs since her last visit.  Down 59 lbs overall TBW loss of 24.8%  Nichole Smith is here to discuss her progress with her obesity treatment plan. She is on the keeping a food journal and adhering to recommended goals of 1300 calories and 90 protein and states she is following her eating plan approximately 50% for calorie goal and 80 % for protein goal of the time. She states she is exercising walking/weights/strength training and water aerobics 50 minutes 7 times per week.  Nichole Smith is a 68 year old female who presents for follow-up of her obesity treatment plan.  She has been actively working on weight loss, achieving a reduction of 59 pounds since February 2020, with a current weight of 179 pounds. Her regimen includes tracking macros, consuming whole foods, ensuring adequate protein intake, and maintaining hydration. She adheres to regular meal patterns and sleeps 7 to 9 hours per night. Her physical activity consists of walking, strength training, and water aerobics for 50 minutes daily. Recently, she has lost an additional four pounds since her last visit.  Fasting IC was done today:  Her resting energy expenditure (REE) was measured at 1267 calories today, which is lower than predicted and her previous resting metabolic rate (RMR). She has undergone five indirect calorimetry tests, with the most recent showing a REE of 1267 calories. She is currently on a journaling plan to track her progress.  She is taking Zepbound  for primary indication of CAD with coronary calcium  score of 1272 and elevated lipoprotein(a) as well as for obesity management. She reports she experiences hunger, particularly towards the end of the week. She is considering adjusting the spacing of her doses. She has accumulated  about three months' worth of medication and is concerned about insurance coverage for future prescriptions.  Her past medical history includes coronary artery disease with calcification and elevated lipoprotein A, prediabetes, lumbar radiculopathy, and vitamin D  deficiency.  Her husband has been diagnosed with diabetes, which has influenced her dietary habits at home. His non-fasting glucose was 430, and his A1c was 13.0%. He is currently on metformin  and is taking his diagnosis seriously, which has helped her maintain a healthier diet at home.  Fasting labs today.  The patient was informed we would discuss the lab results at the next visit unless there is a critical issue that needs to be addressed sooner. The patient agreed to keep the next visit at the agreed upon time to discuss these results.   OBJECTIVE: Visit Diagnoses: Problem List Items Addressed This Visit     Thyroiditis, autoimmune   Relevant Orders   TSH   T4, free   T3   Coronary artery calcification   Relevant Medications   ZEPBOUND  15 MG/0.5ML Pen   Vitamin D  deficiency   Relevant Orders   VITAMIN D  25 Hydroxy (Vit-D Deficiency, Fractures)   Prediabetes   Relevant Medications   ZEPBOUND  15 MG/0.5ML Pen   Other Relevant Orders   CMP14+EGFR   Hemoglobin A1c   Insulin , random   Lumbar radiculopathy   Relevant Medications   ZEPBOUND  15 MG/0.5ML Pen   SOBOE (shortness of breath on exertion) - Primary   Relevant Orders   Vitamin B12   Obesity, Starting BMI 46.48   Relevant Medications   ZEPBOUND  15 MG/0.5ML Pen  Other Visit Diagnoses       Elevated lipoprotein(a)       Relevant Medications   ZEPBOUND  15 MG/0.5ML Pen   Other Relevant Orders   Lipid Panel With LDL/HDL Ratio     BMI 36.0-36.9,adult Current BMI 36.1          SOBOE Nichole Smith notes increasing shortness of breath with exercising and seems to be worsening over time with weight gain. She notes getting out of breath sooner with activity than she used  to. This has not gotten worse recently. Nichole Smith denies shortness of breath at rest or orthopnea.  Fasting IC was done today:  Her resting energy expenditure (REE) was measured at 1267 calories today, which is lower than predicted and her previous resting metabolic rate (RMR). She has undergone five indirect calorimetry tests, with the most recent showing a REE of 1267 calories. She is currently on a journaling plan to track her progress. Plan; Nichole Smith does not feel that she gets out of breath more easily that she used to when she exercises. Nichole Smith's shortness of breath appears to be obesity related and exercise induced. She has agreed to work on weight loss and gradually increase exercise to treat her exercise induced shortness of breath. Will continue to monitor closely.  She does not feel she would be able to follow a further calorie reduction and is doing well with her protein intake overall.  Will plan to stay on current journaling plan of 1300 calories and 90-100 grams of protein daily.   OSA using CPAP Has hx of severe OSA with AHI of 85/hr on initial testing in 2015.  Has good compliance with CPAP.  Plan:  Intensive lifestyle modifications are the first line treatment for this issue. We discussed several lifestyle modifications today and she will continue to work on diet, exercise and weight loss efforts. We will continue to monitor. Orders and follow up as documented in patient record.  Submit prior authorization for Zepbound  for OSA management .   Obesity  She has lost 59 pounds, approximately 24.8% of her body weight.  Her resting energy expenditure has decreased, a common physiological response to weight loss. She engages in regular physical activity, including walking, strength training, and water aerobics, and follows a dietary plan that includes tracking macros and consuming whole foods. She is on Zepbound , aiding her weight loss by promoting adipose tissue utilization for energy. Plan to  continue Zepbound  under prediabetes and cardiac conditions for coverage. - Resubmit prior authorization for Zepbound  under prediabetes and cardiac conditions. - Continue current exercise regimen and dietary plan. - Monitor weight and adjust plan as needed if does not continue to show desired progress.  Atherosclerotic heart disease of native coronary artery with elevated lipoprotein(a) She has coronary artery disease with elevated lipoprotein(a). The focus is on risk reduction through weight loss and lifestyle modifications, which may improve heart health and potentially reduce plaque formation. On Zepbound  15 mg weekly for CV risk reduction and lipitor 40 mg daily, metoprolol  50 mg daily, hydrochlorothiazide , diltiazem  and Xarelto  20 mg daily.  - Continue weight loss efforts to reduce cardiovascular risk. - Monitor cardiac health and lipid levels. Sees Cardiology regularly in follow up  Prediabetes She is managing her condition through lifestyle modifications, including diet and exercise. Zepbound  is also being justified under prediabetes for insurance coverage purposes. - Resubmit prior authorization for Zepbound  under prediabetes. - Continue lifestyle modifications to manage blood glucose levels.  Lumbar radiculopathy She is performing physical therapy exercises to  manage her condition and reports improvement in her back, allowing her to increase physical activity levels. - Continue physical therapy exercises for lumbar radiculopathy. Sees Dr. EUSTACE Sharps for management  Vitamin D  deficiency Current vitamin D  levels will be assessed with upcoming lab work. - Order vitamin D  level as part of lab work.  Vitals Temp: 98.3 F (36.8 C) BP: 109/61 Pulse Rate: 60 SpO2: 97 %   Anthropometric Measurements Height: 4' 11 (1.499 m) Weight: 179 lb (81.2 kg) BMI (Calculated): 36.13 Weight at Last Visit: 183 lb Weight Lost Since Last Visit: 4 lb Weight Gained Since Last Visit: 0 Starting  Weight: 238 lb Total Weight Loss (lbs): 59 lb (26.8 kg)   Body Composition  Body Fat %: 46.8 % Fat Mass (lbs): 83.8 lbs Muscle Mass (lbs): 90.4 lbs Total Body Water (lbs): 67.8 lbs Visceral Fat Rating : 15   Other Clinical Data RMR: 1267 Fasting: Yes Labs: Yes Today's Visit #: 53 Starting Date: 01/12/19 Comments: IC DONE TODAY     ASSESSMENT AND PLAN:  Diet: Lucia is currently in the action stage of change. As such, her goal is to continue with weight loss efforts. She has agreed to keeping a food journal and adhering to recommended goals of 1300 calories and 90-100 grams of protein.  Exercise: Nichole Smith has been instructed to continue exercising as is and consider increasing weight training to increase muscle mass for weight loss and overall health benefits.   Behavior Modification:  We discussed the following Behavioral Modification Strategies today: increasing lean protein intake, decreasing simple carbohydrates, increasing vegetables, increase H2O intake, increase high fiber foods, no skipping meals, meal planning and cooking strategies, avoiding temptations, planning for success, and keep a strict food journal. We discussed various medication options to help Nichole Smith with her weight loss efforts and we both agreed to continue current treatment plan.  Return in about 8 weeks (around 10/12/2024).Nichole Smith She was informed of the importance of frequent follow up visits to maximize her success with intensive lifestyle modifications for her multiple health conditions.  Attestation Statements:   Reviewed by clinician on day of visit: allergies, medications, problem list, medical history, surgical history, family history, social history, and previous encounter notes.   Time spent on visit including pre-visit chart review and post-visit care and charting was 37 minutes.    Nichole Devincent, PA-C

## 2024-08-17 ENCOUNTER — Ambulatory Visit (INDEPENDENT_AMBULATORY_CARE_PROVIDER_SITE_OTHER): Admitting: Physician Assistant

## 2024-08-17 ENCOUNTER — Encounter (INDEPENDENT_AMBULATORY_CARE_PROVIDER_SITE_OTHER): Payer: Self-pay | Admitting: Physician Assistant

## 2024-08-17 VITALS — BP 109/61 | HR 60 | Temp 98.3°F | Ht 59.0 in | Wt 179.0 lb

## 2024-08-17 DIAGNOSIS — Z6836 Body mass index (BMI) 36.0-36.9, adult: Secondary | ICD-10-CM

## 2024-08-17 DIAGNOSIS — R0602 Shortness of breath: Secondary | ICD-10-CM

## 2024-08-17 DIAGNOSIS — E063 Autoimmune thyroiditis: Secondary | ICD-10-CM | POA: Diagnosis not present

## 2024-08-17 DIAGNOSIS — R7303 Prediabetes: Secondary | ICD-10-CM | POA: Diagnosis not present

## 2024-08-17 DIAGNOSIS — E559 Vitamin D deficiency, unspecified: Secondary | ICD-10-CM | POA: Diagnosis not present

## 2024-08-17 DIAGNOSIS — E7841 Elevated Lipoprotein(a): Secondary | ICD-10-CM

## 2024-08-17 DIAGNOSIS — I2510 Atherosclerotic heart disease of native coronary artery without angina pectoris: Secondary | ICD-10-CM | POA: Diagnosis not present

## 2024-08-17 DIAGNOSIS — E669 Obesity, unspecified: Secondary | ICD-10-CM

## 2024-08-17 DIAGNOSIS — G4733 Obstructive sleep apnea (adult) (pediatric): Secondary | ICD-10-CM

## 2024-08-17 DIAGNOSIS — M5416 Radiculopathy, lumbar region: Secondary | ICD-10-CM

## 2024-08-17 MED ORDER — ZEPBOUND 15 MG/0.5ML ~~LOC~~ SOAJ: 15.0000 mg | SUBCUTANEOUS | 0 refills | Status: DC

## 2024-08-17 NOTE — Progress Notes (Signed)
 Remote Loop Recorder Transmission

## 2024-08-18 LAB — CMP14+EGFR
ALT: 51 IU/L — ABNORMAL HIGH (ref 0–32)
AST: 33 IU/L (ref 0–40)
Albumin: 4 g/dL (ref 3.9–4.9)
Alkaline Phosphatase: 66 IU/L (ref 49–135)
BUN/Creatinine Ratio: 27 (ref 12–28)
BUN: 15 mg/dL (ref 8–27)
Bilirubin Total: 0.8 mg/dL (ref 0.0–1.2)
CO2: 28 mmol/L (ref 20–29)
Calcium: 9.5 mg/dL (ref 8.7–10.3)
Chloride: 98 mmol/L (ref 96–106)
Creatinine, Ser: 0.55 mg/dL — ABNORMAL LOW (ref 0.57–1.00)
Globulin, Total: 2.6 g/dL (ref 1.5–4.5)
Glucose: 77 mg/dL (ref 70–99)
Potassium: 3.2 mmol/L — ABNORMAL LOW (ref 3.5–5.2)
Sodium: 141 mmol/L (ref 134–144)
Total Protein: 6.6 g/dL (ref 6.0–8.5)
eGFR: 99 mL/min/1.73 (ref >59)

## 2024-08-18 LAB — VITAMIN B12: Vitamin B-12: 482 pg/mL (ref 232–1245)

## 2024-08-18 LAB — TSH: TSH: 1.27 u[IU]/mL (ref 0.450–4.500)

## 2024-08-18 LAB — LIPID PANEL WITH LDL/HDL RATIO
Cholesterol, Total: 129 mg/dL (ref 100–199)
HDL: 57 mg/dL (ref >39)
LDL Chol Calc (NIH): 61 mg/dL (ref 0–99)
LDL/HDL Ratio: 1.1 ratio (ref 0.0–3.2)
Triglycerides: 44 mg/dL (ref 0–149)
VLDL Cholesterol Cal: 11 mg/dL (ref 5–40)

## 2024-08-18 LAB — HEMOGLOBIN A1C
Est. average glucose Bld gHb Est-mCnc: 105 mg/dL
Hgb A1c MFr Bld: 5.3 % (ref 4.8–5.6)

## 2024-08-18 LAB — INSULIN, RANDOM: INSULIN: 14.4 u[IU]/mL (ref 2.6–24.9)

## 2024-08-18 LAB — VITAMIN D 25 HYDROXY (VIT D DEFICIENCY, FRACTURES): Vit D, 25-Hydroxy: 68 ng/mL (ref 30.0–100.0)

## 2024-08-18 LAB — T4, FREE: Free T4: 1.74 ng/dL (ref 0.82–1.77)

## 2024-08-18 LAB — T3: T3, Total: 115 ng/dL (ref 71–180)

## 2024-08-22 ENCOUNTER — Ambulatory Visit (INDEPENDENT_AMBULATORY_CARE_PROVIDER_SITE_OTHER): Payer: Self-pay | Admitting: Physician Assistant

## 2024-08-22 ENCOUNTER — Other Ambulatory Visit (INDEPENDENT_AMBULATORY_CARE_PROVIDER_SITE_OTHER): Payer: Self-pay | Admitting: Physician Assistant

## 2024-08-22 DIAGNOSIS — E876 Hypokalemia: Secondary | ICD-10-CM

## 2024-08-22 MED ORDER — POTASSIUM CHLORIDE CRYS ER 20 MEQ PO TBCR: 20.0000 meq | EXTENDED_RELEASE_TABLET | Freq: Two times a day (BID) | ORAL | 0 refills | Status: DC

## 2024-08-23 ENCOUNTER — Encounter (INDEPENDENT_AMBULATORY_CARE_PROVIDER_SITE_OTHER): Payer: Self-pay | Admitting: Physician Assistant

## 2024-08-24 NOTE — Progress Notes (Unsigned)
 Nichole Smith [facility] [address] [place] [address] Phone: [phone] Subjective:    I'm seeing this patient by the request  of:  Panosh, Apolinar POUR, MD  CC:   YEP:Dlagzrupcz  07/08/2024 Will continue to monitor.  Does have spinal stenosis.  Found patient still holding out for any type of surgical intervention.  Discussed repeating epidurals that has worked previously.  Continue to work on weight loss.  No change in medication.  Return in 2 to 3 months did prescribe a trial Journavax and warned the potential trial aisles with prescription of medication as well as potential side effects.  Also titrating down off of the gabapentin  new prescription of 100 mg twice daily prescribed      Repeat injection given today.  Encourage patient continue to work on weight loss and staying active.  Patient has done a relatively good job.  Has been trying to change different diet changes as well.  Wants to stay away from too many medications if possible at this time.  Due to patient having atrial fibrillation has been on Eliquis but been stable for a while so will discuss with cardiologist coming off of this to see if then she would have a opportunity to do anti-inflammatories.  Would recommend the potential for Celebrex discussed icing regimen and home exercises otherwise.  Follow-up again in 6 to 8 weeks      Update 08/25/2024 Nichole Smith is a 68 y.o. female coming in with complaint of lumbar spine and R hip pain. 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  Download 08/2014:  Good compliance, breakthru apnea on 10cm.  Changed to auto 5-12cm >> good control of AHI on f/u download ONO on CPAP:       Osteoarthritis    PCOS (polycystic ovarian syndrome)    PMB (postmenopausal bleeding)    SUI (stress urinary incontinence, female)    h/o   Wears glasses    Past Surgical History:  Procedure Laterality Date   ANTERIOR CERVICAL DECOMP/DISCECTOMY FUSION  12/24/2017   Procedure: Cervical five-six Cervical six-seven Anterior cervical decompression/discectomy/fusion;  Surgeon: Unice Pac, MD;  Location: [facility] OR;  Service: Neurosurgery;;   CARPAL TUNNEL RELEASE Left 12/24/2017   Procedure: LEFT CARPAL TUNNEL RELEASE;  Surgeon: Unice Pac, MD;  Location: [facility] OR;  Service: Neurosurgery;  Laterality: Left;   CATARACT EXTRACTION Bilateral    Right eye 08/24/2023   CESAREAN SECTION     x 1 - twins   COLONOSCOPY  2021   DILATATION & CURRETTAGE/HYSTEROSCOPY WITH RESECTOCOPE N/A 07/18/2013   Procedure: DILATATION & CURETTAGE/HYSTEROSCOPY WITH RESECTOCOPE;  Surgeon: Ronal Elvie Pinal, MD;  Location: [facility];  Service: Gynecology;  Laterality: N/A;  mass resection   HYSTEROSCOPY WITH D & C N/A 05/05/2023   Procedure: DILATATION AND CURETTAGE /HYSTEROSCOPY;  Surgeon: Pinal Ronal RAMAN, MD;  Location: [facility]  [place];  Service: Gynecology;  Laterality: N/A;   implantable loop recorder placement  05/20/2021   Medtronic Reveal Linq model [id number] implantable loop recorder (SN [id number])   Lung tumor removed  2012   Rt lung carcinoid   MOUTH SURGERY     pre-cancerous ulcer removed   POLYPECTOMY     SHOULDER OPEN ROTATOR CUFF REPAIR  07/20/2010   Right   WISDOM TOOTH EXTRACTION     Social History   Socioeconomic History   Marital status: Married    Spouse name: Cheryl   Number of children: 2   Years of education: Not on file   Highest education level: Bachelor's degree  (e.g., BA, AB, BS)  Occupational History   Occupation: retired    Associate Professor: Museum/gallery exhibitions officer  Tobacco Use   Smoking status: Never   Smokeless tobacco: Never   Tobacco comments:    Never smoked 08/07/23  Vaping Use   Vaping status: Never Used  Substance and Sexual Activity   Alcohol use: Yes    Comment: occ   Drug use: No   Sexual activity: Not Currently    Partners: Male    Birth control/protection: Post-menopausal  Other Topics Concern   Not on file  Social History Narrative   hhof 2    2 Children at college and  beyond      [name]    Married    Retired age 63 2015   Recently moved taking care of grandchildren during the week.   Social Drivers of Corporate investment banker Strain: Low Risk  (05/28/2024)   Overall Financial Resource Strain (CARDIA)    Difficulty of Paying Living Expenses: Not hard at all  Food Insecurity: No Food Insecurity (05/28/2024)   Hunger Vital Sign    Worried About Running Out of Food in the Last Year: Never true    Ran Out of Food in the Last Year: Never true  Transportation Needs: No Transportation Needs (05/28/2024)   PRAPARE - Administrator, Civil Service (Medical): No    Lack of Transportation (Non-Medical): No  Physical Activity: Insufficiently Active (05/28/2024)   Exercise Vital Sign    Days of Exercise per Week: 4 days    Minutes of Exercise per Session: 30 min  Stress: No Stress Concern Present (05/28/2024)   [employer] of Occupational Health - Occupational Stress Questionnaire    Feeling of Stress: Only a little  Social Connections: Socially Integrated (05/28/2024)   Social Connection and Isolation Panel    Frequency of Communication with Friends and Family: Twice a week    Frequency of Social Gatherings with Friends and Family: Twice a week    Attends Religious Services: More than 4 times per year    Active Member of [employer] or Organizations: Yes    Attends Engineer, structural:  More than 4 times per year    Marital Status: Married   Allergies  Allergen Reactions   Neomycin Other (See Comments)    Inflammation--eye drop    Ace Inhibitors Cough   Family History  Problem Relation Age of Onset   Stroke Mother        died age 10   Hypertension Mother    Sudden death Mother    Alcoholism Mother    Coronary artery disease Father        died age 59   Hypertension Father    Liver disease Father    Alcohol abuse Father  Obesity Father    Parkinson's disease Brother    Diabetes Brother    Hypertension Brother    Hypertension Maternal Uncle    Colon cancer Neg Hx    Rectal cancer Neg Hx    Stomach cancer Neg Hx    Colon polyps Neg Hx    Esophageal cancer Neg Hx     Current Outpatient Medications (Endocrine & Metabolic):    alendronate  (FOSAMAX ) 70 MG tablet, Take 1 tablet (70 mg total) by mouth every 7 (seven) days. Take first thing in am with 6 oz. Water.  Be upright after taking.  Eat nothing for one hour.   SYNTHROID  125 MCG tablet, Take 1 tablet (125 mcg total) by mouth daily before breakfast. Dosage change  Current Outpatient Medications (Cardiovascular):    atorvastatin  (LIPITOR) 40 MG tablet, Take 1 tablet (40 mg total) by mouth daily.   chlorthalidone  (HYGROTON ) 25 MG tablet, TAKE 1 TABLET DAILY   diltiazem  (CARDIZEM  CD) 180 MG 24 hr capsule, TAKE 1 CAPSULE DAILY   diltiazem  (CARDIZEM ) 30 MG tablet, 1 tablet every 6 hours for palpitations   metoprolol  succinate (TOPROL -XL) 50 MG 24 hr tablet, TAKE 1 TABLET DAILY   Current Outpatient Medications (Analgesics):    Suzetrigine  (JOURNAVX ) 50 MG TABS, Take 50 mg by mouth 2 (two) times daily.  Current Outpatient Medications (Hematological):    XARELTO  20 MG TABS tablet, TAKE 1 TABLET DAILY WITH SUPPER  Current Outpatient Medications (Other):    CALCIUM  PO, Take 1 tablet by mouth at bedtime.    Doxepin  HCl 3 MG TABS, Take 1 tablet (3 mg total) by mouth at bedtime as needed.   estradiol  (ESTRACE )  0.1 MG/GM vaginal cream, INSERT 1 GM VAGINALLY TWICE WEEKLY   GEMTESA  75 MG TABS, Take 1 tablet (75 mg total) by mouth daily as needed.   Multiple Vitamins-Minerals (PRESERVISION AREDS 2 PO), Take 1 tablet by mouth 2 (two) times daily.   phenazopyridine  (PYRIDIUM ) 200 MG tablet, Take 1 tablet (200 mg total) by mouth 3 (three) times daily as needed for pain.   potassium chloride  SA (KLOR-CON  M) 20 MEQ tablet, Take 1 tablet (20 mEq total) by mouth 2 (two) times daily for 7 days.   Probiotic Product (PROBIOTIC PO), Take by mouth.   ZEPBOUND  15 MG/0.5ML Pen, Inject 15 mg into the skin once a week.   Reviewed prior external information including notes and imaging from  primary care provider As well as notes that were available from care everywhere and other healthcare systems.  Past medical history, social, surgical and family history all reviewed in electronic medical record.  No pertanent information unless stated regarding to the chief complaint.   Review of Systems:  No headache, visual changes, nausea, vomiting, diarrhea, constipation, dizziness, abdominal pain, skin rash, fevers, chills, night sweats, weight loss, swollen lymph nodes, body aches, joint swelling, chest pain, shortness of breath, mood changes. POSITIVE muscle aches  Objective  Last menstrual period 02/12/2011.   General: No apparent distress alert and oriented x3 mood and affect normal, dressed appropriately.  HEENT: Pupils equal, extraocular movements intact  Respiratory: Patient's speak in full sentences and does not appear short of breath  Cardiovascular: No lower extremity edema, non tender, no erythema      Impression and Recommendations:

## 2024-08-25 ENCOUNTER — Ambulatory Visit

## 2024-08-25 ENCOUNTER — Encounter: Payer: Self-pay | Admitting: Family Medicine

## 2024-08-25 ENCOUNTER — Ambulatory Visit (INDEPENDENT_AMBULATORY_CARE_PROVIDER_SITE_OTHER): Admitting: Family Medicine

## 2024-08-25 VITALS — BP 106/62 | HR 68 | Ht 59.0 in | Wt 181.0 lb

## 2024-08-25 DIAGNOSIS — M1712 Unilateral primary osteoarthritis, left knee: Secondary | ICD-10-CM | POA: Diagnosis not present

## 2024-08-25 DIAGNOSIS — M25562 Pain in left knee: Secondary | ICD-10-CM | POA: Diagnosis not present

## 2024-08-25 DIAGNOSIS — G8929 Other chronic pain: Secondary | ICD-10-CM

## 2024-08-25 DIAGNOSIS — M5416 Radiculopathy, lumbar region: Secondary | ICD-10-CM | POA: Diagnosis not present

## 2024-08-25 DIAGNOSIS — I480 Paroxysmal atrial fibrillation: Secondary | ICD-10-CM

## 2024-08-25 LAB — CUP PACEART REMOTE DEVICE CHECK
Date Time Interrogation Session: 20250924231100
Implantable Pulse Generator Implant Date: 20220620

## 2024-08-25 NOTE — Patient Instructions (Addendum)
 Xray Knee injection Heel lift in toe of shoes Shoes: New Balance 990 and HOKA Bondhi See me again in 2 months

## 2024-08-25 NOTE — Assessment & Plan Note (Signed)
 Continues to have some difficulty.  Unlikely did improve significantly with the epidural.  Still having no unfortunately weakness with dorsiflexion of the foot that we will need to monitor.  Discussed proper shoes.  Worsening condition will need to consider the possibility of an AFO and nerve conduction study.  Patient is continuing to try to stay as active as possible.

## 2024-08-25 NOTE — Assessment & Plan Note (Signed)
 Injection given today.  Tolerated the procedure well.  Hopeful that this will make some difference.  Will get x-rays to further evaluate.  Discussed icing regimen and home exercises, I discussed the importance of weight loss and patient has done well but would like to get a BMI under 35.  Differential includes some of the lumbar radiculopathy and we do know that that is a concern with patient.  Also patient has had significant amount of ankle pathology and will get new shoes.

## 2024-08-26 ENCOUNTER — Ambulatory Visit: Payer: Self-pay | Admitting: Cardiology

## 2024-08-29 ENCOUNTER — Encounter (HOSPITAL_BASED_OUTPATIENT_CLINIC_OR_DEPARTMENT_OTHER): Payer: Self-pay | Admitting: Obstetrics & Gynecology

## 2024-08-29 ENCOUNTER — Ambulatory Visit (HOSPITAL_BASED_OUTPATIENT_CLINIC_OR_DEPARTMENT_OTHER): Payer: Medicare Other | Admitting: Obstetrics & Gynecology

## 2024-08-29 ENCOUNTER — Telehealth (INDEPENDENT_AMBULATORY_CARE_PROVIDER_SITE_OTHER): Payer: Self-pay

## 2024-08-29 VITALS — BP 116/64 | HR 72 | Wt 180.2 lb

## 2024-08-29 DIAGNOSIS — M810 Age-related osteoporosis without current pathological fracture: Secondary | ICD-10-CM | POA: Diagnosis not present

## 2024-08-29 DIAGNOSIS — B009 Herpesviral infection, unspecified: Secondary | ICD-10-CM | POA: Diagnosis not present

## 2024-08-29 DIAGNOSIS — Z9189 Other specified personal risk factors, not elsewhere classified: Secondary | ICD-10-CM

## 2024-08-29 DIAGNOSIS — N390 Urinary tract infection, site not specified: Secondary | ICD-10-CM | POA: Diagnosis not present

## 2024-08-29 MED ORDER — ESTRADIOL 0.1 MG/GM VA CREA: TOPICAL_CREAM | VAGINAL | 3 refills | Status: AC

## 2024-08-29 MED ORDER — ALENDRONATE SODIUM 70 MG PO TABS: 70.0000 mg | ORAL_TABLET | ORAL | 3 refills | Status: AC

## 2024-08-29 MED ORDER — NITROFURANTOIN MONOHYD MACRO 100 MG PO CAPS: 100.0000 mg | ORAL_CAPSULE | Freq: Two times a day (BID) | ORAL | 0 refills | Status: DC

## 2024-08-29 NOTE — Progress Notes (Signed)
 Remote Loop Recorder Transmission

## 2024-08-29 NOTE — Telephone Encounter (Signed)
 Prior auth for Zepbound  15mg   for OSA sent to insurance via cover my meds:  Information regarding your request Message from [facility]: Drug is not covered by plan.   Patient notified via my chart.

## 2024-08-29 NOTE — Progress Notes (Signed)
 Breast and Pelvic Eam Patient name: Nichole Smith MRN [id number]  Date of birth: 11/14/1956 Chief Complaint:   Breast and Pelvic Exam  History of Present Illness:   Nichole Smith is a 68 y.o. G42P1003 Caucasian female being seen today for breast and pelvic exam.  Going to the [facility] today for a treat to herself due to her birthday.  Denies vaginal bleeding.  Has upcomign appt with Dr. Marilynne in urogyn to consider hysterectomy and possible surgical treatment for urinary issues.    Patient's last menstrual period was 02/12/2011.   Last pap 12/26/2022. Results were: NILM w/ HRHPV not done. H/O abnormal pap: no Last mammogram: 12/22/2023. Results were: normal. Family h/o breast cancer: no Last colonoscopy: 11/12/2023. Results were: abnormal, two polyps. Family h/o colorectal cancer: no DEXA:  01/2024 -2.1.       08/29/2024    9:45 AM 01/11/2024    3:35 PM 10/20/2023    2:39 PM 08/27/2023    9:16 AM 06/26/2023    9:37 AM  Depression screen PHQ 2/9  Decreased Interest 0 0 0 0 0  Down, Depressed, Hopeless 0 0 0 0 0  PHQ - 2 Score 0 0 0 0 0  Altered sleeping   1    Tired, decreased energy   1    Change in appetite   1    Feeling bad or failure about yourself    0    Trouble concentrating   2    Moving slowly or fidgety/restless   0    Suicidal thoughts   0    PHQ-9 Score   5    Difficult doing work/chores   Not difficult at all          10/20/2023    2:40 PM 03/17/2023    2:04 PM  GAD 7 : Generalized Anxiety Score  Nervous, Anxious, on Edge 0 0  Control/stop worrying 0 0  Worry too much - different things 0 0  Trouble relaxing 0 0  Restless 0 0  Easily annoyed or irritable 0 0  Afraid - awful might happen 0 0  Total GAD 7 Score 0 0     Review of Systems:   Pertinent items are noted in HPI Denies any current bowel and bladder changes.  Denies pelvic pain. Pertinent History Reviewed:  Reviewed past medical,surgical, social and family history.  Reviewed problem  list, medications and allergies. Physical Assessment:   Vitals:   08/29/24 0938  BP: 116/64  Pulse: 72  SpO2: 99%  Weight: 180 lb 3.2 oz (81.7 kg)  Body mass index is 36.4 kg/m.        Physical Examination:   General appearance - well appearing, and in no distress  Mental status - alert, oriented to person, place, and time  Psych:  She has a normal mood and affect  Skin - warm and dry, normal color, no suspicious lesions noted  Chest - effort normal, all lung fields clear to auscultation bilaterally  Heart - normal rate and regular rhythm  Neck:  midline trachea, no thyromegaly or nodules  Breasts - breasts appear normal, no suspicious masses, no skin or nipple changes or  axillary nodes  Abdomen - soft, nontender, nondistended, no masses or organomegaly  Pelvic - VULVA: normal appearing vulva with no masses, tenderness or lesions   VAGINA: normal appearing vagina with normal color and discharge, no lesions   CERVIX: normal appearing cervix without discharge or lesions, no CMT  Thin  prep pap is not upated  UTERUS: uterus is felt to be normal size, shape, consistency and nontender   ADNEXA: No adnexal masses or tenderness noted.  Rectal - normal rectal, good sphincter tone, no masses felt.   Extremities:  No swelling or varicosities noted  Chaperone present for exam  No results found. However, due to the size of the patient record, not all encounters were searched. Please check Results Review for a complete set of results.  Assessment & Plan:  1. GYN exam for high-risk Medicare patient (Primary) - Pap smear not indicated.  Last pap 12/26/2022. - Mammogram 12/22/2023 - Colonoscopy 11/12/2023 - Bone mineral density 01/2024 - lab work done with PCP, Dr. Charlett - vaccines reviewed/updated  2. Age-related osteoporosis without current pathological fracture - alendronate  (FOSAMAX ) 70 MG tablet; Take 1 tablet (70 mg total) by mouth every 7 (seven) days. Take first thing in am with 6  oz. Water.  Be upright after taking.  Eat nothing for one hour.  Dispense: 13 tablet; Refill: 3  3. Recurrent UTI - nitrofurantoin , macrocrystal-monohydrate, (MACROBID ) 100 MG capsule; Take 1 capsule (100 mg total) by mouth 2 (two) times daily.  Dispense: 10 capsule; Refill: 0 - estradiol  (ESTRACE ) 0.1 MG/GM vaginal cream; INSERT 1 GM VAGINALLY TWICE WEEKLY  Dispense: 42.5 g; Refill: 3  4. HSV infection   No orders of the defined types were placed in this encounter.   Meds:  Meds ordered this encounter  Medications   nitrofurantoin , macrocrystal-monohydrate, (MACROBID ) 100 MG capsule    Sig: Take 1 capsule (100 mg total) by mouth 2 (two) times daily.    Dispense:  10 capsule    Refill:  0   alendronate  (FOSAMAX ) 70 MG tablet    Sig: Take 1 tablet (70 mg total) by mouth every 7 (seven) days. Take first thing in am with 6 oz. Water.  Be upright after taking.  Eat nothing for one hour.    Dispense:  13 tablet    Refill:  3   estradiol  (ESTRACE ) 0.1 MG/GM vaginal cream    Sig: INSERT 1 GM VAGINALLY TWICE WEEKLY    Dispense:  42.5 g    Refill:  3    Follow-up: Return in about 1 year (around 08/29/2025).  [name], MD 09/03/2024 6:11 PM

## 2024-08-30 ENCOUNTER — Encounter (INDEPENDENT_AMBULATORY_CARE_PROVIDER_SITE_OTHER): Payer: Self-pay | Admitting: Physician Assistant

## 2024-08-30 ENCOUNTER — Other Ambulatory Visit (INDEPENDENT_AMBULATORY_CARE_PROVIDER_SITE_OTHER): Payer: Self-pay | Admitting: Physician Assistant

## 2024-08-30 DIAGNOSIS — I2510 Atherosclerotic heart disease of native coronary artery without angina pectoris: Secondary | ICD-10-CM

## 2024-08-30 DIAGNOSIS — E559 Vitamin D deficiency, unspecified: Secondary | ICD-10-CM

## 2024-08-30 DIAGNOSIS — G4733 Obstructive sleep apnea (adult) (pediatric): Secondary | ICD-10-CM

## 2024-08-30 DIAGNOSIS — R7303 Prediabetes: Secondary | ICD-10-CM

## 2024-08-30 MED ORDER — ZEPBOUND 15 MG/0.5ML ~~LOC~~ SOLN: 15.0000 mg | SUBCUTANEOUS | 0 refills | Status: DC

## 2024-08-30 NOTE — Progress Notes (Signed)
 Pt reports insurance no longer covering Zepbound  for CAD, OSA and obesity.  Requests current dose to be sent to [facility].... Not certain the 15 mg dose is available by vial but have sent order to [facility] and await any further instructions.   Manveer Gomes,PA-C

## 2024-08-31 ENCOUNTER — Encounter (HOSPITAL_BASED_OUTPATIENT_CLINIC_OR_DEPARTMENT_OTHER): Payer: Self-pay | Admitting: Physical Therapy

## 2024-08-31 ENCOUNTER — Ambulatory Visit (HOSPITAL_BASED_OUTPATIENT_CLINIC_OR_DEPARTMENT_OTHER): Attending: Family Medicine | Admitting: Physical Therapy

## 2024-08-31 DIAGNOSIS — R29898 Other symptoms and signs involving the musculoskeletal system: Secondary | ICD-10-CM | POA: Insufficient documentation

## 2024-08-31 DIAGNOSIS — M6281 Muscle weakness (generalized): Secondary | ICD-10-CM | POA: Insufficient documentation

## 2024-08-31 DIAGNOSIS — M5416 Radiculopathy, lumbar region: Secondary | ICD-10-CM | POA: Insufficient documentation

## 2024-08-31 NOTE — Therapy (Signed)
 OUTPATIENT PHYSICAL THERAPY THORACOLUMBAR TREATMENT   Patient Name: Nichole Smith MRN: [id number] DOB:[birth year], 68 y.o., female Today's Date: 09/01/2024  END OF SESSION: PT End of Session - 08/31/2024    Visit Number 7   Number of Visits 7   Date for PT Re-Evaluation 08/11/2024   Authorization Type MCR A&B   PT Start Time  1026 pt arrived 11 mins late to treatment   PT Stop Time 1110   PT Time Calculation (min) 44 min   Activity Tolerance Patient tolerated treatment well    Behavior During Therapy  [facility] for tasks assessed/performed     Past Medical History:  Diagnosis Date   Arthritis    knees, back   Bursitis    hips - almost gone per pt    Carcinoid tumor of lung (HCC) 2012   right found incidentally on chest xray eval for r/o vasxculitis    Coronary artery calcification 07/23/2016   Dysrhythmia    irregular occassional, paf  no current problem   Frequent UTI    Hypertension    Hypothyroidism    Infertility, female    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  Download 08/2014:  Good compliance, breakthru apnea on 10cm.  Changed to auto 5-12cm >> good control of AHI on f/u download ONO on CPAP:       Osteoarthritis    PCOS (polycystic ovarian syndrome)    PMB (postmenopausal bleeding)    SUI (stress urinary incontinence, female)    h/o   Wears glasses    Past Surgical History:  Procedure Laterality Date   ANTERIOR CERVICAL DECOMP/DISCECTOMY FUSION  12/24/2017   Procedure: Cervical five-six Cervical six-seven Anterior cervical decompression/discectomy/fusion;  Surgeon: Unice Pac, MD;  Location: [facility] OR;  Service: Neurosurgery;;   CARPAL TUNNEL RELEASE Left 12/24/2017   Procedure: LEFT CARPAL TUNNEL RELEASE;  Surgeon: Unice Pac, MD;  Location: [facility] OR;  Service: Neurosurgery;  Laterality: Left;   CATARACT EXTRACTION Bilateral    Right eye 08/24/2023   CESAREAN SECTION      x 1 - twins   COLONOSCOPY  2021   DILATATION & CURRETTAGE/HYSTEROSCOPY WITH RESECTOCOPE N/A 07/18/2013   Procedure: DILATATION & CURETTAGE/HYSTEROSCOPY WITH RESECTOCOPE;  Surgeon: Ronal Elvie Pinal, MD;  Location: [facility];  Service: Gynecology;  Laterality: N/A;  mass resection   HYSTEROSCOPY WITH D & C N/A 05/05/2023   Procedure: DILATATION AND CURETTAGE /HYSTEROSCOPY;  Surgeon: Pinal Ronal RAMAN, MD;  Location: [facility];  Service: Gynecology;  Laterality: N/A;   implantable loop recorder placement  05/20/2021   [employer]tronic Reveal Linq model [id number] implantable loop recorder (SN [id number])   Lung tumor removed  2012   Rt lung carcinoid   MOUTH SURGERY     pre-cancerous ulcer removed   POLYPECTOMY     SHOULDER OPEN ROTATOR CUFF REPAIR  07/20/2010   Right   WISDOM TOOTH EXTRACTION     Patient Active Problem List   Diagnosis Date Noted   Chronic anticoagulation 10/20/2023   History of colonic polyps 10/20/2023   Urinary incontinence, mixed 10/19/2023   Pelvic organ prolapse quantification stage 2 cystocele 10/19/2023   Paroxysmal atrial fibrillation (HCC) 08/07/2023   Hypercoagulable state due to paroxysmal atrial fibrillation (HCC) 08/07/2023   Polyphagia 07/28/2023   Cataract of both eyes 07/28/2023   Obesity, Starting BMI 46.48 07/28/2023   BMI 38.0-38.9,adult 05/05/2023   Right rotator cuff tear arthropathy 04/29/2023  Recurrent UTI 03/29/2023   Postmenopausal bleeding 03/29/2023   Greater trochanteric bursitis, left 03/04/2023   SOBOE (shortness of breath on exertion) 10/14/2022   Peroneal tendon tear, right, initial encounter 10/07/2022   Avulsion fracture of lateral malleolus 09/09/2022   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   Osteopenia of both hips 08/10/2022   Dermatofibroma 07/25/2022   Digital mucinous cyst of finger of right hand 07/25/2022   Sun-damaged skin 07/25/2022   Gluteal tendonitis of right buttock 06/19/2021   Greater trochanteric bursitis of right  hip 05/08/2021   Chronic idiopathic constipation 05/01/2021   Lumbar radiculopathy 04/04/2021   Healthcare maintenance 12/25/2020   Insulin  resistance 03/05/2020   Vitamin D  deficiency 08/25/2019   Prediabetes 08/25/2019   Acute pain of right knee 09/28/2018   Lichen planus 04/30/2018   Cervical stenosis of spinal canal 12/24/2017   Hyperlipidemia LDL goal <70 08/26/2017   Heart palpitations 08/26/2017   Hammertoe of right foot 01/06/2017   Coronary artery calcification 07/23/2016   Periodic limb movements of sleep 06/18/2016   Atherosclerotic plaque 06/11/2016   Piriformis syndrome of left side 10/24/2015   Loss of transverse plantar arch of left foot 09/12/2015   Degenerative arthritis of left knee 08/16/2015   Thyroiditis, autoimmune 02/26/2015   Obstructive sleep apnea 07/27/2014   Midline low back pain without sciatica 06/20/2014   Polycythemia, secondary 03/22/2014   Severe obesity (BMI >= 40) (HCC) 02/22/2014   Elevated hemoglobin 02/22/2014   Left shoulder pain 02/04/2013   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) 01/07/2008   PALPITATIONS, RECURRENT 01/07/2008   Essential hypertension 06/25/2007   Osteoarthritis 06/25/2007     REFERRING PROVIDER: Claudene Arthea HERO, DO   REFERRING DIAG: F45.83 (ICD-10-CM) - Lumbar radiculopathy   Rationale for Evaluation and Treatment: Rehabilitation  THERAPY DIAG:  Radiculopathy, lumbar region  Muscle weakness (generalized)  Other symptoms and signs involving the musculoskeletal system  ONSET DATE: Exacerbation December 2024  SUBJECTIVE:                                                                                                                                                                                            SUBJECTIVE STATEMENT:  Pt states she has been celebrating her birthday and hasn't performed her home exercises for the past 3 days.  Pt states she felt great after prior treatment.  Pt  states she is able to cook dinner in the evenings now.  Pt reports improved tolerance with daily activities and daily mobility in the afternoon.  Pt reports she is able to ambulate her normal community distance without significant pain.     PERTINENT HISTORY:  -Arthritis--knees, back.  Hx of stenosis.  Lumbar anterolisthesis and mild retrolisthesis. -Bilat Hip bursitis -A-fib, HTN, PCOS -osteoporosis which has changed to osteopenia  -PSHx:   Cervical fusion 2019, Loop recorder implant 2022, R RCR 2011, L carpal tunnel release 2019, and Lung tumor removed 2012   PAIN:  NPRS:  3/10 current Location:  R sided lumbar   Aggravating factors:  standing 10-15 mins Easing factors:  lying down, sitting, bending  PRECAUTIONS: Other: lumbar anterolisthesis and mild retrolisthesis, A-fib, loop recorder implant, cervical fusion, osteopenia   WEIGHT BEARING RESTRICTIONS: No  FALLS:  Has patient fallen in last 6 months? Yes. Number of falls 2  LIVING ENVIRONMENT: Lives with: lives with their spouse Lives in: 1 story home Stairs: no; 1 step to enter home but doesn't have to use that step to enter   OCCUPATION: retired  PLOF:  Pt is independent with mobility, ambulation, and daily activities.  She does have someone that helps her clean her home.  PATIENT GOALS: wants to make sure she is doing the right exercises and not doing things that hurt her back.  Pt wants to be able to get up from the floor.  Better balance.  Improve QOL and avoid back surgery.  Pt wants to be able to perform star s   OBJECTIVE:  Note: Objective measures were completed at Evaluation unless otherwise noted.  DIAGNOSTIC FINDINGS:  Lumbar  MRI: FINDINGS: Segmentation: There are five lumbar type vertebral bodies. The last full intervertebral disc space is labeled L5-S1. This correlates with the prior study.   Alignment: Stable degenerative anterolisthesis of L3 and mild retrolisthesis of L1.   Vertebrae: No fracture, evidence of discitis, or bone lesion. Endplate reactive changes at L1-2 and L2-3.   Conus medullaris and cauda equina: Conus extends to the L1-2 level. Conus and cauda equina appear normal.   Paraspinal and other soft tissues: No significant paraspinal or retroperitoneal findings.   Disc levels:   T12-L1: No significant findings.   L1-2: Mild degenerative retrolisthesis of L1 with a bulging degenerated annulus, osteophytic ridging and facet disease contributing to mild spinal and bilateral lateral recess stenosis, right greater than left. There is also a E broad-based right-sided disc osteophyte complex potentially affecting the extraforaminal right L1 nerve root. These findings appear relatively stable to slightly progressive.   L2-3: Anterolisthesis of L3 with a bulging uncovered disc and severe disc degeneration. There is also osteophytic ridging and facet disease all contributing to moderate spinal and bilateral lateral recess stenosis. Mild foraminal stenosis bilaterally. These findings are slightly progressive since the prior study.   L3-4: Bulging uncovered disc, osteophytic ridging, facet disease and ligamentum flavum thickening contributing to mild to moderate spinal stenosis and moderate bilateral lateral recess stenosis, right greater than left. Stable mild bilateral foraminal stenosis, right greater than left. These findings appear stable.   L4-5: Bulging degenerated annulus, short pedicles, facet disease and ligamentum flavum thickening contributing to moderate spinal and bilateral lateral recess stenosis. Mild foraminal stenosis bilaterally but no significant change.   L5-S1:  Significant facet disease, left greater than right but no disc protrusions, spinal or foraminal stenosis.   IMPRESSION: 1. Degenerative lumbar spondylosis with multilevel disc disease and facet disease. Multilevel multifactorial  spinal, lateral recess and foraminal stenosis as discussed above at the individual levels. 2. Moderate multifactorial spinal and bilateral lateral recess stenosis at L2-3. Mild foraminal stenosis bilaterally. These findings are slightly progressive since the prior study. 3. Stable mild to moderate spinal stenosis and moderate bilateral lateral recess stenosis, right greater than left at L3-4. Stable mild bilateral foraminal stenosis, right greater than left. 4. Stable moderate spinal and bilateral lateral recess stenosis at L4-5. Mild foraminal stenosis bilaterally but no significant change. 5. Mild spinal and bilateral lateral recess stenosis, right greater than left at L1-2. There is also a broad-based right-sided disc osteophyte complex potentially affecting the extraforaminal right L1 nerve root. These findings appear relatively stable to slightly progressive.  PATIENT SURVEYS:  Modified Oswestry:  MODIFIED OSWESTRY DISABILITY SCALE    Date: 05/31/2024 Score 07/14/24 10/1  Pain intensity 4 =  Pain medication provides me with little relief from pain. 1   2. Personal care (washing, dressing, etc.) 0 =  I can take care of myself normally without causing increased pain. 0   3. Lifting 3 = Pain prevents me from lifting heavy weights, but I can manage (5) I have hardly any social life because of my pain. light to medium weights if they are conveniently positioned 3   4. Walking 1 = Pain prevents me from walking more than 1 mile. 1   5. Sitting 1 =  I can only sit in my favorite chair as long as I like. 2   6. Standing 1 =  I can stand as long as I want but, it increases my pain. 4   7. Sleeping 2 =  Even when I take pain medication, I sleep less than 6 hours 1   8.  Social Life 2 = Pain prevents me from participating in more energetic activities (eg. sports, dancing). 2   9. Traveling 1 =  I can travel anywhere, but it increases my pain. 0   10. Employment/ Homemaking 1 = My normal homemaking/job activities increase my pain, but I can still perform all that is required of me 2   Total 16/50= 32% 15/50= 30%    Interpretation of scores: Score Category Description  0-20% Minimal Disability The patient can cope with most living activities. Usually no treatment is indicated apart from advice on lifting, sitting and exercise  21-40% Moderate Disability The patient experiences more pain and difficulty with sitting, lifting and standing. Travel and social life are more difficult and they may be disabled from work. Personal care, sexual activity and sleeping are not grossly affected, and the patient can usually be managed by conservative means  41-60% Severe Disability Pain remains the main problem in this group, but activities of daily living are affected. These patients require a detailed investigation  61-80% Crippled Back pain impinges on all aspects of the patient's life. Positive intervention is required  81-100% Bed-bound  These patients are either bed-bound or exaggerating their symptoms  Bluford FORBES Zoe DELENA Karon DELENA, et al. Surgery versus conservative management of stable thoracolumbar fracture: the PRESTO feasibility RCT. [place]): [employer]; 2021 Nov. [facility] Technology Assessment, No. 25.62.) Appendix 3, Oswestry Disability Index category descriptors. Available from: FindJewel[employer].cz  Minimally Clinically Important Difference (MCID) = 12.8%  COGNITION: Overall cognitive status: Within functional limits for tasks assessed     LUMBAR ROM:   AROM eval 8/14 10/1  Flexion WFL    Extension     Right lateral flexion 90% 90% with pain 90%  Left lateral flexion 90% with pain 90% with pain 90% with pain   Right rotation [facility] WFL  Left rotation 80% WFL WFL   (Blank rows = not tested)   LOWER EXTREMITY MMT:    MMT Right eval Left eval Right 8/14 Left 8/14 Right 9/10 Left 9/10  Hip flexion Tolerated slight resistance 4/5 5/5 5/5    Hip extension        Hip abduction 3/5 4+/5 4-/5 4+/5 5/5 4+/5  Hip adduction        Hip internal rotation        Hip external rotation 4-/5 5/5 4+/5  4+/5   Knee flexion        Knee extension 5/5 5/5      Ankle dorsiflexion     5/5 4/5  Ankle plantarflexion        Ankle inversion        Ankle eversion         (Blank rows = not tested)   TREATMENT:                                                                               PT assessed lumbar ROM and LE strength.   DF with RTB L: 2x10  Supine bridge with clams with GTB with TrA 2x12-15 Supine alt LE extension with TrA while holding 1.1# ball Sidestepping with GTB around thighs with TrA in hallway x 2 reps at rail [employer] press with TrA with GTB 2x12 each  Modified Oswestry Disability Index:  16%  Reviewed HEP and updated HEP.      PATIENT EDUCATION:  Education details: dx, exercise form, gait, relevant anatomy, POC/discharge planning, rationale of interventions, and HEP.  PT answered pt's questions.   Person educated: Patient Education method: Medical illustrator, verbal and tactile cues, handout Education comprehension: verbalized understanding and needs further education, verbal and tactile cues required  HOME EXERCISE PROGRAM: Access Code: [email or web address] URL: [email or web address] Date: 06/21/2024 Prepared by: Mose Minerva  Exercises - Supine Transversus Abdominis Bracing - Hands on Stomach  - 2 x daily - 7 x weekly - 2 sets - 10 reps - 5 seconds hold - Supine Bridge  - 1 x daily - 6-7 x weekly - 2 sets - 10 reps - Hooklying Clamshell with Resistance  - 1 x daily - 4-5 x weekly - 2 sets - 10 reps - Standing Glute/piriformis Mobilization with Small Ball  on Wall  - Supine alt UE/LE 1x/day 2x10 - Supine Core Control with Leg Extension  - 1 x daily - 7 x weekly - 2 sets - 10 reps - Supine Active Straight Leg Raise  - 1 x daily - 5 x weekly - 2 sets - 10 reps - Clamshell  - 1 x daily - 5-7 x weekly - 2 sets - 10 reps - Standing Shoulder Row with Anchored Resistance  - 1 x daily - 5 x weekly - 2 sets - 10 reps - Standing Anti-Rotation Press with Anchored Resistance  - 1 x daily - 5 x weekly - 2 sets - 10 reps - Side Stepping with Resistance at Thighs and Counter Support  - 3 x weekly - 2-3 reps  Updated HEP: - Long Sitting Ankle Dorsiflexion with Anchored Resistance  - 1 x daily - 4 x weekly - 2 sets - 10 reps  ASSESSMENT:  CLINICAL IMPRESSION: Pt has made excellent progress in PT.  Pt has improved with core and LE strength, tolerance to activity, functional mobility, and pain.  Pt reports improved tolerance with daily activities and daily mobility in the afternoon and is able to cook dinner in the evenings now.  She is able to ambulate her normal community distance without significant pain.  Pt has improved in LE strength and demonstrates improved strength with L tibialis anterior as evidenced by increased resistance with DF.  Pt continues to have minimal limitations in bilat SB'ing AROM.  Pt demonstrates clinically significant improvement in self perceived disability with Modified Oswestry improving from 30% prior to 16% today.  PT reviewed HEP with pt and educated pt with correct exercises and form.  Pt is independent with HEP.  She has met all STG's and LTG's #1,3 and partially met LTG's #2,4.  Pt is ready for discharge.   OBJECTIVE IMPAIRMENTS: Abnormal gait, decreased activity tolerance, decreased endurance, decreased mobility, difficulty walking, decreased ROM, decreased strength, hypomobility, increased fascial restrictions, and pain.   ACTIVITY LIMITATIONS: lifting, standing, stairs, and locomotion level  PARTICIPATION LIMITATIONS:  cleaning, shopping, and community activity  PERSONAL FACTORS: Time since onset of injury/illness/exacerbation and 3+ comorbidities: arthritis, bilat hip bursitis, cervical fusion, A-fib are also affecting patient's functional outcome.   REHAB POTENTIAL: Good  CLINICAL DECISION MAKING: Stable/uncomplicated  EVALUATION COMPLEXITY: Low   GOALS:   SHORT TERM GOALS: Target date:  06/21/2024   Pt will perform home exercises without adverse effects for improved pain, strength, ROM, and function. Baseline: Goal status: GOAL MET  8/14  2.  Pt will report improved tolerance with daily activities and daily mobility including in the afternoon.   Baseline:  Goal status: GOAL MET  10/1  3.  Pt will progress with core exercises without adverse effects for improved core stabilization for improved ease and reduced pain with daily activities and household chores.  Baseline:  Goal status: GOAL MET  8/14    LONG TERM GOALS: Target date:  08/11/2024   Pt will be independent and compliant with HEP for improved pain, ROM, strength, and function.  Baseline:  Goal status: GOAL MET  10/1  2.  Pt will demo improved R LE strength to = L LE strength for improved tolerance with and performance of daily activities and functional mobility.  Baseline:  Goal status: PARTIALLY MET  10/1  3.  Pt will report she is able to ambulate her normal community distance without significant pain.   Baseline:  Goal status: GOAL MET  10/1  4.  Pt will demo improved lumbar AROM to be [facility] for bilat SB'ing and L rotation for improved stiffness and daily mobility.   Baseline:  Goal status: 33%  MET   10/1    PLAN:    PLANNED INTERVENTIONS: [id number]- PT Re-evaluation, 97750- Physical Performance Testing, 97110-Therapeutic exercises, 97530- Therapeutic activity, [id number]- Neuromuscular re-education, 97535- Self Care, [id number]- Manual therapy, [id number]- Gait training, [phone]- Aquatic Therapy, [phone]- Ultrasound, Patient/Family  education, Stair training, Taping, Cryotherapy, and Moist heat.  PLAN FOR NEXT SESSION:  Pt to be discharged due to good functional progress and progress toward goals.  She is independent with and will continue with HEP.  Pt is agreeable with discharge.   PHYSICAL THERAPY DISCHARGE SUMMARY  Visits from Start of Care: 7  Current  functional level related to goals / functional outcomes: See above   Remaining deficits: See above   Education / Equipment: HEP    Leigh Minerva III PT, DPT 09/01/24 4:50 PM

## 2024-09-01 NOTE — Progress Notes (Signed)
 Remote Loop Recorder Transmission

## 2024-09-03 ENCOUNTER — Encounter (HOSPITAL_BASED_OUTPATIENT_CLINIC_OR_DEPARTMENT_OTHER): Payer: Self-pay | Admitting: Obstetrics & Gynecology

## 2024-09-05 ENCOUNTER — Encounter

## 2024-09-06 ENCOUNTER — Ambulatory Visit: Admitting: Internal Medicine

## 2024-09-06 ENCOUNTER — Encounter: Payer: Self-pay | Admitting: Internal Medicine

## 2024-09-06 ENCOUNTER — Other Ambulatory Visit: Payer: Self-pay | Admitting: Internal Medicine

## 2024-09-06 ENCOUNTER — Ambulatory Visit: Payer: Self-pay | Admitting: Internal Medicine

## 2024-09-06 VITALS — BP 124/78 | HR 73 | Temp 98.1°F | Ht 59.0 in | Wt 180.8 lb

## 2024-09-06 DIAGNOSIS — E876 Hypokalemia: Secondary | ICD-10-CM

## 2024-09-06 DIAGNOSIS — Z79899 Other long term (current) drug therapy: Secondary | ICD-10-CM

## 2024-09-06 DIAGNOSIS — R7401 Elevation of levels of liver transaminase levels: Secondary | ICD-10-CM

## 2024-09-06 DIAGNOSIS — D3A090 Benign carcinoid tumor of the bronchus and lung: Secondary | ICD-10-CM

## 2024-09-06 DIAGNOSIS — I10 Essential (primary) hypertension: Secondary | ICD-10-CM

## 2024-09-06 LAB — MAGNESIUM: Magnesium: 2 mg/dL (ref 1.5–2.5)

## 2024-09-06 LAB — BASIC METABOLIC PANEL WITH GFR
BUN: 21 mg/dL (ref 6–23)
CO2: 34 meq/L — ABNORMAL HIGH (ref 19–32)
Calcium: 10.3 mg/dL (ref 8.4–10.5)
Chloride: 97 meq/L (ref 96–112)
Creatinine, Ser: 0.55 mg/dL (ref 0.40–1.20)
GFR: 93.13 mL/min (ref >60.00)
Glucose, Bld: 95 mg/dL (ref 70–99)
Potassium: 3.4 meq/L — ABNORMAL LOW (ref 3.5–5.1)
Sodium: 138 meq/L (ref 135–145)

## 2024-09-06 NOTE — Progress Notes (Signed)
 Looks like will need to  be on potassium supplementation to  get in range .  Options 1. 1  20 meq per day I can send in rx to take daily  2. Try otc  potassium supplement ( not precise)   Either way we would recheck  before next lab

## 2024-09-06 NOTE — Progress Notes (Signed)
 Chief Complaint  Patient presents with   Medical Management of Chronic Issues    HPI: Nichole Smith 68 y.o. come in for Chronic disease management  lab fu from weight management .  No new meds dosing well .   Potassium was low 3.2 and alt sightly up . No med changes  Was given potassium 20 meq bid for 7 days  and now off 7 days . Doesn't salt foods  but will use salt substitute occasionally  Is on chlorthalidone  . Bp controlled  Now tylenol  a few times per week .  No etoh .   Supplements  tart cherry extract. For back and ms pain .(  On anticoagulant so other meds NA and gabapentin  didn't change sx so stopped)  Was To take supp bid for week or so . And then to be recheck   Twitching   legs at times   has stopped  multivits  Weight stable  on  zepbound    May be end up with hysterectomy  January   Due for reg chest x ry fu regarding  lung carcinoid.  No resp sx    ROS: See pertinent positives and negatives per HPI.  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  Download 08/2014:  Good compliance, breakthru apnea on 10cm.  Changed to auto 5-12cm >> good control of AHI on f/u download ONO on CPAP:       Osteoarthritis    PCOS (polycystic ovarian syndrome)    PMB (postmenopausal bleeding)    SUI (stress urinary incontinence, female)    h/o   Wears glasses     Family History  Problem Relation Age of Onset   Stroke Mother        died age 4   Hypertension Mother    Sudden death Mother     Alcoholism Mother    Coronary artery disease Father        died age 30   Hypertension Father    Liver disease Father    Alcohol abuse Father    Obesity Father    Parkinson's disease Brother    Diabetes Brother    Hypertension Brother    Hypertension Maternal Uncle    Colon cancer Neg Hx    Rectal cancer Neg Hx    Stomach cancer Neg Hx    Colon polyps Neg Hx    Esophageal cancer Neg Hx     Social History   Socioeconomic History   Marital status: Married    Spouse name: Cheryl   Number of children: 2   Years of education: Not on file   Highest education level: Bachelor's degree (e.g., BA, AB, BS)  Occupational History   Occupation: retired    Associate Professor: [employer]  Tobacco Use   Smoking status: Never   Smokeless tobacco: Never   Tobacco comments:    Never smoked 08/07/23  Vaping Use  Vaping status: Never Used  Substance and Sexual Activity   Alcohol use: Not Currently    Comment: occ   Drug use: No   Sexual activity: Not Currently    Partners: Male    Birth control/protection: Post-menopausal  Other Topics Concern   Not on file  Social History Narrative   hhof 2    2 Children at college and  beyond      [name]    Married    Retired age 89 2015   Recently moved taking care of grandchildren during the week.   Social Drivers of Corporate investment banker Strain: Low Risk  (05/28/2024)   Overall Financial Resource Strain (CARDIA)    Difficulty of Paying Living Expenses: Not hard at all  Food Insecurity: No Food Insecurity (05/28/2024)   Hunger Vital Sign    Worried About Running Out of Food in the Last Year: Never true    Ran Out of Food in the Last Year: Never true  Transportation Needs: No Transportation Needs (05/28/2024)   PRAPARE - Administrator, Civil Service (Medical): No    Lack of Transportation (Non-Medical): No  Physical Activity: Insufficiently Active (05/28/2024)   Exercise Vital Sign    Days of Exercise  per Week: 4 days    Minutes of Exercise per Session: 30 min  Stress: No Stress Concern Present (05/28/2024)   [employer] of Occupational Health - Occupational Stress Questionnaire    Feeling of Stress: Only a little  Social Connections: Socially Integrated (05/28/2024)   Social Connection and Isolation Panel    Frequency of Communication with Friends and Family: Twice a week    Frequency of Social Gatherings with Friends and Family: Twice a week    Attends Religious Services: More than 4 times per year    Active Member of [employer] or Organizations: Yes    Attends Engineer, structural: More than 4 times per year    Marital Status: Married    Outpatient Medications Prior to Visit  Medication Sig Dispense Refill   alendronate  (FOSAMAX ) 70 MG tablet Take 1 tablet (70 mg total) by mouth every 7 (seven) days. Take first thing in am with 6 oz. Water.  Be upright after taking.  Eat nothing for one hour. 13 tablet 3   atorvastatin  (LIPITOR) 40 MG tablet Take 1 tablet (40 mg total) by mouth daily. 90 tablet 3   CALCIUM  PO Take 1 tablet by mouth at bedtime.      chlorthalidone  (HYGROTON ) 25 MG tablet TAKE 1 TABLET DAILY 90 tablet 3   diltiazem  (CARDIZEM  CD) 180 MG 24 hr capsule TAKE 1 CAPSULE DAILY 90 capsule 3   diltiazem  (CARDIZEM ) 30 MG tablet 1 tablet every 6 hours for palpitations 60 tablet 3   Doxepin  HCl 3 MG TABS Take 1 tablet (3 mg total) by mouth at bedtime as needed. 30 tablet 2   estradiol  (ESTRACE ) 0.1 MG/GM vaginal cream INSERT 1 GM VAGINALLY TWICE WEEKLY 42.5 g 3   GEMTESA  75 MG TABS Take 1 tablet (75 mg total) by mouth daily as needed. 90 tablet 3   metoprolol  succinate (TOPROL -XL) 50 MG 24 hr tablet TAKE 1 TABLET DAILY 90 tablet 2   phenazopyridine  (PYRIDIUM ) 200 MG tablet Take 1 tablet (200 mg total) by mouth 3 (three) times daily as needed for pain. 10 tablet 0   Probiotic Product (PROBIOTIC PO) Take by mouth.     SYNTHROID  125 MCG tablet Take 1 tablet (125  mcg total)  by mouth daily before breakfast. Dosage change 90 tablet 3   Tirzepatide -Weight Management (ZEPBOUND ) 15 MG/0.5ML SOLN Inject 15 mg into the skin once a week. 2 mL 0   XARELTO  20 MG TABS tablet TAKE 1 TABLET DAILY WITH SUPPER 90 tablet 3   Multiple Vitamins-Minerals (PRESERVISION AREDS 2 PO) Take 1 tablet by mouth 2 (two) times daily. (Patient not taking: Reported on 09/06/2024)     nitrofurantoin , macrocrystal-monohydrate, (MACROBID ) 100 MG capsule Take 1 capsule (100 mg total) by mouth 2 (two) times daily. (Patient not taking: Reported on 09/06/2024) 10 capsule 0   potassium chloride  SA (KLOR-CON  M) 20 MEQ tablet Take 1 tablet (20 mEq total) by mouth 2 (two) times daily for 7 days. (Patient not taking: Reported on 09/06/2024) 14 tablet 0   Suzetrigine  (JOURNAVX ) 50 MG TABS Take 50 mg by mouth 2 (two) times daily. (Patient not taking: Reported on 09/06/2024) 180 tablet 0   No facility-administered medications prior to visit.     EXAM:  BP 124/78 (BP Location: Left Arm, Patient Position: Sitting, Cuff Size: Large)   Pulse 73   Temp 98.1 F (36.7 C) (Oral)   Ht 4' 11 (1.499 m)   Wt 180 lb 12.8 oz (82 kg)   LMP 02/12/2011   SpO2 97%   BMI 36.52 kg/m   Body mass index is 36.52 kg/m.  GENERAL: vitals reviewed and listed above, alert, oriented, appears well hydrated and in no acute distress NECK: no obvious masses on inspection palpation  LUNGS: clear to auscultation bilaterally, no wheezes, rales or rhonchi, good air movement CV: HRRR, no clubbing cyanosis or  peripheral edema  Ecchymosis  distal forearms no petechia  MS: moves all extremities independent gait  PSYCH: pleasant and cooperative, no obvious depression or anxiety Lab Results  Component Value Date   WBC 7.5 10/07/2023   HGB 16.7 (H) 10/07/2023   HCT 50.6 (H) 10/07/2023   PLT 254 10/07/2023   GLUCOSE 77 08/17/2024   CHOL 129 08/17/2024   TRIG 44 08/17/2024   HDL 57 08/17/2024   LDLCALC 61 08/17/2024   ALT 51 (H)  08/17/2024   AST 33 08/17/2024   NA 141 08/17/2024   K 3.2 (L) 08/17/2024   CL 98 08/17/2024   CREATININE 0.55 (L) 08/17/2024   BUN 15 08/17/2024   CO2 28 08/17/2024   TSH 1.270 08/17/2024   INR 0.96 06/11/2011   HGBA1C 5.3 08/17/2024   BP Readings from Last 3 Encounters:  09/06/24 124/78  08/29/24 116/64  08/25/24 106/62  Lab review non fasting today  update bmet  mag  ASSESSMENT AND PLAN:  Discussed the following assessment and plan:  Hypokalemia - Plan: Basic metabolic panel with GFR, Magnesium  Elevated ALT measurement  Carcinoid tumor of lung, unspecified whether malignant (HCC) - due for routine c xray fu . - Plan: DG Chest 2 View  Medication management  Morbid obesity (HCC) - improved on  zepbound  Record review   low k most likely from med even though not a problem in past . Will follow  nl gfr . Alt hx of borderline in past .  Stop supp and fu lab  before visit in December  and go from there  . Tylenol  . Doubt autoimmune  with nl AP  Had  neg hep b and hep c eval in 2012   can repeat hep c  at next labs  Consider  abd us  if  persistent or progressive .  Poss  masld  but on good regimen for  weight management .  -Patient advised to return or notify health care team  if  new concerns arise. 30 minutes record review  ordering and plan fu for potassium alt elevation and hx of carcinoid  and med check  .  Patient Instructions  Good to see you today .  Stop supplements for now as  possible.  And plan labs   in 6-8 weeks for fu ALT .   Potassium fu today .  Will order chest x ray routine .  At [facility] .    Basir Niven K. Denario Bagot M.D.

## 2024-09-06 NOTE — Progress Notes (Unsigned)
 To be done pre dec visit

## 2024-09-06 NOTE — Patient Instructions (Addendum)
 Good to see you today .  Stop supplements for now as  possible.  And plan labs   in 6-8 weeks for fu ALT .   Potassium fu today .  Will order chest x ray routine .  At [facility] .

## 2024-09-07 MED ORDER — POTASSIUM CHLORIDE CRYS ER 20 MEQ PO TBCR: 20.0000 meq | EXTENDED_RELEASE_TABLET | Freq: Every day | ORAL | 1 refills | Status: DC

## 2024-09-07 NOTE — Telephone Encounter (Signed)
 Yes please send in kcl 20 meq disp 90 refill x1 , take 1 per day to  pharmacy she requested.

## 2024-09-22 ENCOUNTER — Other Ambulatory Visit (INDEPENDENT_AMBULATORY_CARE_PROVIDER_SITE_OTHER): Payer: Self-pay | Admitting: Physician Assistant

## 2024-09-22 DIAGNOSIS — R7303 Prediabetes: Secondary | ICD-10-CM

## 2024-09-22 DIAGNOSIS — G4733 Obstructive sleep apnea (adult) (pediatric): Secondary | ICD-10-CM

## 2024-09-22 DIAGNOSIS — I2510 Atherosclerotic heart disease of native coronary artery without angina pectoris: Secondary | ICD-10-CM

## 2024-09-26 ENCOUNTER — Ambulatory Visit (INDEPENDENT_AMBULATORY_CARE_PROVIDER_SITE_OTHER)

## 2024-09-26 DIAGNOSIS — I480 Paroxysmal atrial fibrillation: Secondary | ICD-10-CM | POA: Diagnosis not present

## 2024-09-26 LAB — CUP PACEART REMOTE DEVICE CHECK
Date Time Interrogation Session: 20251026231143
Implantable Pulse Generator Implant Date: 20220620

## 2024-09-28 ENCOUNTER — Ambulatory Visit: Payer: Self-pay | Admitting: Cardiology

## 2024-09-29 NOTE — Progress Notes (Signed)
 Remote Loop Recorder Transmission

## 2024-10-05 ENCOUNTER — Ambulatory Visit: Admitting: Family Medicine

## 2024-10-09 NOTE — Progress Notes (Unsigned)
 SUBJECTIVE: Discussed the use of AI scribe software for clinical note transcription with the patient, who gave verbal consent to proceed.  Chief Complaint: Obesity  Interim History: She is up 1 lb since her last visit.  Down 58 lbs overall TBW Loss of 24.4%  Nichole Smith is here to discuss her progress with her obesity treatment plan. She is on the keeping a food journal and adhering to recommended goals of 1300 calories and 90 grams of protein and states she is following her eating plan approximately 95 % of the time. She states she is exercising walking/swimming/weights 40 minutes 7 times per week.  The patient is a 68 year old with obesity who presents for follow-up of her obesity treatment plan.  She has gained one pound since her last visit. She is tracking her calories, consuming approximately 1300 calories and 90 grams of protein 95% of the time. She is eating more whole foods and meeting her protein goals but feels she is not drinking enough water. She is not skipping meals and is engaging in physical activities such as walking, swimming, and weight training for 40 minutes daily.  She has difficulty maintaining her calorie goal, often exceeding it by up to 300 calories, particularly due to social events and seasonal foods like pumpkin bread. Her calorie target is very low, making it challenging to stay within limits. She is taking Zepbound  for weight management and has a supply until January. She has been exploring insurance coverage options for Mounjaro .  She experiences pain issues that impact her ability to exercise and enjoy life, particularly in her upper body and toes. She is on a blood thinner and is concerned about pain management options that are compatible with her medication and plans to discuss anticoagulation further with cardiology.   She has a family history of liver failure, with her father and his twin brother both dying from it, and is concerned about her liver function  tests, which have shown elevated levels recently. She is currently avoiding Tylenol .  She is on multiple medications, including a statin, and is concerned about potential interactions affecting her liver function. She has a history of elevated hemoglobin and hematocrit, which has been monitored by hematology in the past. OBJECTIVE: Visit Diagnoses: Problem List Items Addressed This Visit     PAIN IN JOINT, MULTIPLE SITES   Obstructive sleep apnea - Primary   Relevant Medications   tirzepatide  (MOUNJARO ) 15 MG/0.5ML Pen   Prediabetes   Relevant Medications   tirzepatide  (MOUNJARO ) 15 MG/0.5ML Pen   Obesity, Starting BMI 46.48   Relevant Medications   tirzepatide  (MOUNJARO ) 15 MG/0.5ML Pen   Other Visit Diagnoses       Elevated ALT measurement         Hypokalemia         BMI 36.0-36.9,adult Current BMI 36.4         Obesity Management is ongoing with a focus on dietary modifications and physical activity. She is tracking calories at approximately 1300 calories per day and consuming 90 grams of protein 95% of the time. She is engaging in regular physical activity, including walking, swimming, and weight training for 40 minutes seven days per week. Despite these efforts, she has gained one pound since the last visit, attributed to occasional overconsumption of calories, particularly during social events and holidays. We discussed using the 80/20 philosophy for weight management, aiming for 80% adherence to dietary goals and 20% flexibility. She is currently using Zepbound  for weight management and is  exploring insurance coverage options for Mounjaro . Discussed potential future availability of oral medications for weight management, which may be more cost-effective. - Continue current dietary and exercise regimen. - Continue Zepbound  for weight management. - Explored insurance coverage options for Mounjaro . - Scheduled follow-up appointment in January to reassess weight management  plan.   Severe OSA using CPAP She has lost 24.4% of total body weight . She remains symptomatic and is using CPAP as directed.   NPSG 07/2014: AHI 85/hr. Intensive lifestyle modifications are the first line treatment for this issue. We discussed several lifestyle modifications today and she will continue to work on diet, exercise and weight loss efforts. We will continue to monitor. Orders and follow up as documented in patient record. Educated pt that OSA is a cause of systemic hypertension and is associated with an increased incidence of stroke, heart failure, atrial fibrillation, and coronary heart disease.  Severe OSA increases all-cause mortality and cardiovascular mortality.  -Goal: Treatment of OSA via CPAP compliance and weight loss. Plasma ghrelin levels (appetite or "hunger hormone") are significantly higher in OSA patients than in BMI-matched controls, but decrease to levels similar to those of obese patients without OSA after CPAP treatment.  Weight loss improves OSA by several mechanisms, including reduction in fatty tissue in the throat (i.e. parapharyngeal fat) and the tongue. Loss of abdominal fat increases mediastinal traction on the upper airway making it less likely to collapse during sleep. Studies have also shown that compliance with CPAP treatment improves leptin imbalance. Plan: She believes her plan may cover Mounjaro  for this indication and we will explore prior authorization for Mounjaro  at equivalent dose.  She will continue to work on weight loss and exercise to improve OSA .   Elevated liver enzymes Noted, with ALT at 51 and AST at 33. The fibrosis-4 score is 1.27, indicating a low risk of advanced fibrosis. Family history of liver failure due to alcohol use. Potential contributing factors include statin use and GLP-1 medication. Imaging of the liver is recommended to rule out fibrosis, despite the low fibrosis-4 score and she has follow up with her PCP scheduled and will  discuss setting up imaging as PCP recommended. - Recommend liver ultrasound to assess for fibrosis. - Continue to monitor liver function tests. Avoid use of other liver toxic medications like Tylenol .   Hypokalemia Noted, with potassium levels slightly low. She is taking a potassium supplement. The cause of hypokalemia is unclear, but it may be related to medication use, including chlorthalidone . - Continue potassium supplementation. - Will discuss potassium levels with cardiologist during upcoming appointment.  Prediabetes Last A1c was 5.3- at goal/ Insulin  14.4- not at goal  Medication(s): Zepbound  15 mg SQ weekly Polyphagia:Yes Lab Results  Component Value Date   HGBA1C 5.3 08/17/2024   HGBA1C 5.3 10/07/2023   HGBA1C 5.6 02/18/2023   HGBA1C 5.7 08/18/2022   HGBA1C 5.5 05/13/2022   Lab Results  Component Value Date   INSULIN  14.4 08/17/2024   INSULIN  13.9 10/07/2023   INSULIN  13.8 02/18/2023   INSULIN  22.2 09/16/2022   INSULIN  12.2 05/13/2022    Plan: Start Mounjaro  15 mg SQ weekly  Pain in joints, multiple Management is complicated by the use of blood thinners, limiting pain medication options. She experiences significant pain that affects her ability to function and engage in physical activity. She is consulting with cardiologists to explore pain management options while on blood thinners. - plans to discuss with cardiologists regarding pain management options while on blood thinners.  Vitals Temp: 98 F (36.7 C) BP: 108/71 Pulse Rate: 65 SpO2: 94 %   Anthropometric Measurements Height: 4' 11 (1.499 m) Weight: 180 lb (81.6 kg) BMI (Calculated): 36.34 Weight at Last Visit: 179 lb Weight Lost Since Last Visit: 0 Weight Gained Since Last Visit: 1 lb Starting Weight: 238 lb Total Weight Loss (lbs): 58 lb (26.3 kg)   Body Composition  Body Fat %: 47.7 % Fat Mass (lbs): 85.8 lbs Muscle Mass (lbs): 89.4 lbs Total Body Water (lbs): 69 lbs Visceral Fat  Rating : 15   Other Clinical Data Fasting: No Labs: No Today's Visit #: 70 Starting Date: 01/12/19     ASSESSMENT AND PLAN:  Diet: Donyae is currently in the action stage of change. As such, her goal is to continue with weight loss efforts. She has agreed to keeping a food journal and adhering to recommended goals of 1300 calories and 90 grams of protein.  Exercise: Winefred has been instructed to continue exercising as is for weight loss and overall health benefits.   Behavior Modification:  We discussed the following Behavioral Modification Strategies today: increasing lean protein intake, decreasing simple carbohydrates, increasing vegetables, increase H2O intake, increase high fiber foods, meal planning and cooking strategies, holiday eating strategies, avoiding temptations, planning for success, and keep a strict food journal. We discussed various medication options to help Tashae with her weight loss efforts and we both agreed to continue current treatment plan and submit PA for Mounjaro  for OSA, prediabetes as well as medical weight loss.  Return in about 8 weeks (around 12/05/2024).SABRA She was informed of the importance of frequent follow up visits to maximize her success with intensive lifestyle modifications for her multiple health conditions.  Attestation Statements:   Reviewed by clinician on day of visit: allergies, medications, problem list, medical history, surgical history, family history, social history, and previous encounter notes.   Time spent on visit including pre-visit chart review and post-visit care and charting was 43 minutes.    Stewart Pimenta, PA-C

## 2024-10-10 ENCOUNTER — Encounter

## 2024-10-10 ENCOUNTER — Ambulatory Visit (INDEPENDENT_AMBULATORY_CARE_PROVIDER_SITE_OTHER): Payer: Self-pay | Admitting: Physician Assistant

## 2024-10-10 ENCOUNTER — Encounter (INDEPENDENT_AMBULATORY_CARE_PROVIDER_SITE_OTHER): Payer: Self-pay | Admitting: Physician Assistant

## 2024-10-10 VITALS — BP 108/71 | HR 65 | Temp 98.0°F | Ht 59.0 in | Wt 180.0 lb

## 2024-10-10 DIAGNOSIS — E559 Vitamin D deficiency, unspecified: Secondary | ICD-10-CM

## 2024-10-10 DIAGNOSIS — E876 Hypokalemia: Secondary | ICD-10-CM

## 2024-10-10 DIAGNOSIS — E669 Obesity, unspecified: Secondary | ICD-10-CM

## 2024-10-10 DIAGNOSIS — I2510 Atherosclerotic heart disease of native coronary artery without angina pectoris: Secondary | ICD-10-CM

## 2024-10-10 DIAGNOSIS — R7303 Prediabetes: Secondary | ICD-10-CM | POA: Diagnosis not present

## 2024-10-10 DIAGNOSIS — Z6836 Body mass index (BMI) 36.0-36.9, adult: Secondary | ICD-10-CM

## 2024-10-10 DIAGNOSIS — R7401 Elevation of levels of liver transaminase levels: Secondary | ICD-10-CM

## 2024-10-10 DIAGNOSIS — G4733 Obstructive sleep apnea (adult) (pediatric): Secondary | ICD-10-CM

## 2024-10-10 DIAGNOSIS — M2550 Pain in unspecified joint: Secondary | ICD-10-CM

## 2024-10-10 MED ORDER — TIRZEPATIDE 15 MG/0.5ML ~~LOC~~ SOAJ: 15.0000 mg | SUBCUTANEOUS | 2 refills | Status: DC

## 2024-10-13 ENCOUNTER — Encounter: Payer: Self-pay | Admitting: Cardiology

## 2024-10-13 ENCOUNTER — Ambulatory Visit: Attending: Cardiology | Admitting: Cardiology

## 2024-10-13 VITALS — BP 122/76 | HR 61 | Ht 59.0 in | Wt 183.2 lb

## 2024-10-13 DIAGNOSIS — I480 Paroxysmal atrial fibrillation: Secondary | ICD-10-CM | POA: Diagnosis present

## 2024-10-13 DIAGNOSIS — I10 Essential (primary) hypertension: Secondary | ICD-10-CM | POA: Insufficient documentation

## 2024-10-13 DIAGNOSIS — Z79899 Other long term (current) drug therapy: Secondary | ICD-10-CM | POA: Diagnosis present

## 2024-10-13 DIAGNOSIS — E785 Hyperlipidemia, unspecified: Secondary | ICD-10-CM | POA: Insufficient documentation

## 2024-10-13 DIAGNOSIS — I2510 Atherosclerotic heart disease of native coronary artery without angina pectoris: Secondary | ICD-10-CM | POA: Insufficient documentation

## 2024-10-13 LAB — BASIC METABOLIC PANEL WITH GFR
BUN/Creatinine Ratio: 30 — ABNORMAL HIGH (ref 12–28)
BUN: 17 mg/dL (ref 8–27)
CO2: 28 mmol/L (ref 20–29)
Calcium: 9.9 mg/dL (ref 8.7–10.3)
Chloride: 97 mmol/L (ref 96–106)
Creatinine, Ser: 0.56 mg/dL — ABNORMAL LOW (ref 0.57–1.00)
Glucose: 75 mg/dL (ref 70–99)
Potassium: 3.7 mmol/L (ref 3.5–5.2)
Sodium: 138 mmol/L (ref 134–144)
eGFR: 98 mL/min/{1.73_m2}

## 2024-10-13 LAB — CBC WITH DIFFERENTIAL/PLATELET
Basophils Absolute: 0.1 x10E3/uL (ref 0.0–0.2)
Basos: 1 %
EOS (ABSOLUTE): 0.4 x10E3/uL (ref 0.0–0.4)
Eos: 4 %
Hematocrit: 49.8 % — ABNORMAL HIGH (ref 34.0–46.6)
Hemoglobin: 16.6 g/dL — ABNORMAL HIGH (ref 11.1–15.9)
Immature Grans (Abs): 0 x10E3/uL (ref 0.0–0.1)
Immature Granulocytes: 0 %
Lymphocytes Absolute: 2.5 x10E3/uL (ref 0.7–3.1)
Lymphs: 25 %
MCH: 32 pg (ref 26.6–33.0)
MCHC: 33.3 g/dL (ref 31.5–35.7)
MCV: 96 fL (ref 79–97)
Monocytes Absolute: 0.8 x10E3/uL (ref 0.1–0.9)
Monocytes: 8 %
Neutrophils Absolute: 6.2 x10E3/uL (ref 1.4–7.0)
Neutrophils: 62 %
Platelets: 283 x10E3/uL (ref 150–450)
RBC: 5.18 x10E6/uL (ref 3.77–5.28)
RDW: 11.8 % (ref 11.7–15.4)
WBC: 10 x10E3/uL (ref 3.4–10.8)

## 2024-10-13 MED ORDER — ATORVASTATIN CALCIUM 40 MG PO TABS: 40.0000 mg | ORAL_TABLET | Freq: Every day | ORAL | 3 refills | Status: AC

## 2024-10-13 MED ORDER — DILTIAZEM HCL 30 MG PO TABS: ORAL_TABLET | ORAL | 3 refills | Status: AC

## 2024-10-13 MED ORDER — METOPROLOL SUCCINATE ER 50 MG PO TB24: 50.0000 mg | ORAL_TABLET | Freq: Every day | ORAL | 2 refills | Status: AC

## 2024-10-13 MED ORDER — DILTIAZEM HCL ER COATED BEADS 180 MG PO CP24: 180.0000 mg | ORAL_CAPSULE | Freq: Every day | ORAL | 3 refills | Status: AC

## 2024-10-13 NOTE — Addendum Note (Signed)
 Addended by: JANIT GENI CROME on: 10/13/2024 09:11 AM   Modules accepted: Orders

## 2024-10-13 NOTE — Progress Notes (Signed)
 Date:  10/13/2024   ID:  Nichole Smith, Nichole Smith 07/31/1956, MRN [id number]   PCP:  Charlett Apolinar POUR, MD  Cardiologist:  [name] Bihari, MD  Electrophysiologist:  Soyla Gladis Norton, MD   Chief Complaint:  Coronary calcifications, HTN, HLD, DM  History of Present Illness:    Nichole Smith is a 68 y.o. 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 Lucas, HTN, OSA on CPAP followed by Dr. Shellia and coronary artery calcifications on chest CT as well as calcifications of the aortic arch and carotid bifurcation.  She had a nuclear stress test 08/2018 that was low risk with no ischemia.  She underwent coronary Ca score and was very elevated at 1272.  She is here today and is doing well.  She denies any chest pain or pressure, SOB, DOE, LE edema, dizziness or syncope.  Occasionally she will have a few palpitations but no afib.  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  Download 08/2014:  Good compliance, breakthru apnea on 10cm.  Changed to auto 5-12cm >> good control of AHI on f/u download ONO on CPAP:       Osteoarthritis    PCOS (polycystic ovarian syndrome)    PMB (postmenopausal bleeding)    SUI (stress urinary incontinence, female)    h/o   Wears glasses    Past Surgical History:  Procedure Laterality Date   ANTERIOR CERVICAL DECOMP/DISCECTOMY FUSION  12/24/2017   Procedure: Cervical  five-six Cervical six-seven Anterior cervical decompression/discectomy/fusion;  Surgeon: Unice Pac, MD;  Location: [facility] OR;  Service: Neurosurgery;;   CARPAL TUNNEL RELEASE Left 12/24/2017   Procedure: LEFT CARPAL TUNNEL RELEASE;  Surgeon: Unice Pac, MD;  Location: [facility] OR;  Service: Neurosurgery;  Laterality: Left;   CATARACT EXTRACTION Bilateral    Right eye 08/24/2023   CESAREAN SECTION     x 1 - twins   COLONOSCOPY  2021   DILATATION & CURRETTAGE/HYSTEROSCOPY WITH RESECTOCOPE N/A 07/18/2013   Procedure: DILATATION & CURETTAGE/HYSTEROSCOPY WITH RESECTOCOPE;  Surgeon: Ronal Elvie Pinal, MD;  Location: [facility];  Service: Gynecology;  Laterality: N/A;  mass resection   HYSTEROSCOPY WITH D & C N/A 05/05/2023   Procedure: DILATATION AND CURETTAGE /HYSTEROSCOPY;  Surgeon: Pinal Ronal RAMAN, MD;  Location: [facility] [place];  Service: Gynecology;  Laterality: N/A;   implantable loop recorder placement  05/20/2021   Medtronic Reveal Linq model [id number] implantable loop recorder (SN [id number])   Lung tumor removed  2012   Rt lung carcinoid   MOUTH SURGERY     pre-cancerous ulcer removed   POLYPECTOMY     SHOULDER OPEN ROTATOR CUFF REPAIR  07/20/2010   Right   WISDOM TOOTH EXTRACTION       Current Meds  Medication Sig   alendronate  (FOSAMAX ) 70  MG tablet Take 1 tablet (70 mg total) by mouth every 7 (seven) days. Take first thing in am with 6 oz. Water.  Be upright after taking.  Eat nothing for one hour.   CALCIUM  PO Take 1 tablet by mouth at bedtime.    chlorthalidone  (HYGROTON ) 25 MG tablet TAKE 1 TABLET DAILY   Doxepin  HCl 3 MG TABS Take 1 tablet (3 mg total) by mouth at bedtime as needed.   estradiol  (ESTRACE ) 0.1 MG/GM vaginal cream INSERT 1 GM VAGINALLY TWICE WEEKLY   GEMTESA  75 MG TABS Take 1 tablet (75 mg total) by mouth daily as needed.   Multiple Vitamins-Minerals (PRESERVISION AREDS 2 PO) Take 1 tablet by mouth 2 (two) times daily.   phenazopyridine  (PYRIDIUM ) 200 MG  tablet Take 1 tablet (200 mg total) by mouth 3 (three) times daily as needed for pain.   potassium chloride  SA (KLOR-CON  M) 20 MEQ tablet Take 1 tablet (20 mEq total) by mouth daily.   Probiotic Product (PROBIOTIC PO) Take by mouth.   SYNTHROID  125 MCG tablet Take 1 tablet (125 mcg total) by mouth daily before breakfast. Dosage change   tirzepatide  (MOUNJARO ) 15 MG/0.5ML Pen Inject 15 mg into the skin once a week.   XARELTO  20 MG TABS tablet TAKE 1 TABLET DAILY WITH SUPPER   [DISCONTINUED] atorvastatin  (LIPITOR) 40 MG tablet Take 1 tablet (40 mg total) by mouth daily.   [DISCONTINUED] diltiazem  (CARDIZEM  CD) 180 MG 24 hr capsule TAKE 1 CAPSULE DAILY   [DISCONTINUED] diltiazem  (CARDIZEM ) 30 MG tablet 1 tablet every 6 hours for palpitations   [DISCONTINUED] metoprolol  succinate (TOPROL -XL) 50 MG 24 hr tablet TAKE 1 TABLET DAILY     Allergies:   Neomycin and Ace inhibitors   Social History   Tobacco Use   Smoking status: Never   Smokeless tobacco: Never   Tobacco comments:    Never smoked 08/07/23  Vaping Use   Vaping status: Never Used  Substance Use Topics   Alcohol use: Not Currently    Comment: occ   Drug use: No     Family Hx: The patient's family history includes Alcohol abuse in her father; Alcoholism in her mother; Coronary artery disease in her father; Diabetes in her brother; Hypertension in her brother, father, maternal uncle, and mother; Liver disease in her father; Obesity in her father; Parkinson's disease in her brother; Stroke in her mother; Sudden death in her mother. There is no history of Colon cancer, Rectal cancer, Stomach cancer, Colon polyps, or Esophageal cancer.  ROS:   Please see the history of present illness.     All other systems reviewed and are negative.   Labs/Other Tests and Data Reviewed:    Recent Labs: 08/17/2024: ALT 51; TSH 1.270 09/06/2024: BUN 21; Creatinine, Ser 0.55; Magnesium 2.0; Potassium 3.4; Sodium 138   Recent Lipid Panel Lab  Results  Component Value Date/Time   CHOL 129 08/17/2024 09:45 AM   CHOL 110 11/03/2016 12:00 AM   TRIG 44 08/17/2024 09:45 AM   TRIG 54 11/03/2016 12:00 AM   HDL 57 08/17/2024 09:45 AM   HDL 48 (L) 11/03/2016 12:00 AM   CHOLHDL 2 08/18/2022 08:04 AM   LDLCALC 61 08/17/2024 09:45 AM   LDLCALC 49 11/03/2016 12:00 AM    EKG Interpretation Date/Time:  Thursday October 13 2024 08:36:22 EST Ventricular Rate:  61 PR Interval:  142 QRS Duration:  80 QT Interval:  402 QTC Calculation: 404 R Axis:   -1  Text Interpretation: Normal  sinus rhythm with sinus arrhythmia Low voltage QRS Junctional ST depression, probably normal When compared with ECG of 07-Aug-2023 08:59, No significant change was found Confirmed by Shlomo Corning ([id number]) on 10/13/2024 8:54:27 AM     Objective:    Vital Signs:  BP 122/76   Pulse 61   Ht 4' 11 (1.499 m)   Wt 183 lb 3.2 oz (83.1 kg)   LMP 02/12/2011   SpO2 99%   BMI 37.00 kg/m   GEN: Well nourished, well developed in no acute distress HEENT: Normal NECK: No JVD; No carotid bruits LYMPHATICS: No lymphadenopathy CARDIAC:RRR, no murmurs, rubs, gallops RESPIRATORY:  Clear to auscultation without rales, wheezing or rhonchi  ABDOMEN: Soft, non-tender, non-distended MUSCULOSKELETAL:  No edema; No deformity  SKIN: Warm and dry NEUROLOGIC:  Alert and oriented x 3 PSYCHIATRIC:  Normal affect  ASSESSMENT & PLAN:    1.  Coronary artery calcifications/Aortic atherosclerosis -nuclear stress test showed no ischemia 01/2020 -coronary calcium  score very high at 1272 -Denies any anginal sx -continue prescription drug management with atorvasttin 40mg  daily and Toprol  XL 25mg  daily -no ASA due to DOAC   2.  HTN -BP controlled on exam today -Continue chlorthalidone  25 g daily, Cardizem  CD 180 mg daily, Toprol -XL 50 mg daily with as needed refills -labs were personally reviewed from [facility] showing a SCr 0.55 and potassium 3.4 on 09/06/2024 -check BMET  3.   HLD -LDL goal < 70 -I have personally reviewed and interpreted outside labs performed by patient's PCP which showed LDL 61, HDL 57, triglycerides 44 and ALT 51 on 08/17/2024 -Continue atorvastatin  40 mg daily with as needed refills     4.  PAF -This was diagnosed on a cardio mobile device -her CHADS2VASC 2 is 68 (female, HTN, age >22)  - She is followed by Dr. Inocencio with EP   - cannot use Flecainide due to significant coronary Ca burden - Denies any palpitations or bleeding problems on DOAC - ILR showed no events in the past 30 days - Continue Xarelto  20 mg daily and Toprol -XL 50 mg daily as well as Cardizem  CD 180 mg daily along with short acting Cardizem  30 mg every 6 hours as needed for breakthrough palpitations -I have personally reviewed and interpreted outside labs performed by patient's PCP which showed hemoglobin 16.7 on 10/07/2023 - Repeat CBC  5. OSA  -she follows with Dr. Jude  Medication Adjustments/Labs and Tests Ordered: Current medicines are reviewed at length with the patient today.  Concerns regarding medicines are outlined above.  Tests Ordered: Orders Placed This Encounter  Procedures   EKG 12-Lead   Medication Changes: Meds ordered this encounter  Medications   atorvastatin  (LIPITOR) 40 MG tablet    Sig: Take 1 tablet (40 mg total) by mouth daily.    Dispense:  90 tablet    Refill:  3   diltiazem  (CARDIZEM  CD) 180 MG 24 hr capsule    Sig: Take 1 capsule (180 mg total) by mouth daily.    Dispense:  90 each    Refill:  3   diltiazem  (CARDIZEM ) 30 MG tablet    Sig: 1 tablet every 6 hours for palpitations    Dispense:  60 tablet    Refill:  3   metoprolol  succinate (TOPROL -XL) 50 MG 24 hr tablet    Sig: Take 1 tablet (50 mg total) by mouth daily.    Dispense:  90 tablet    Refill:  2    Disposition:  Follow up in  1 year(s)  Signed, [name] Bihari, MD  10/13/2024 8:56 AM    [facility]

## 2024-10-13 NOTE — Patient Instructions (Signed)
 Medication Instructions:  Your physician recommends that you continue on your current medications as directed. Please refer to the Current Medication list given to you today.  *If you need a refill on your cardiac medications before your next appointment, please call your pharmacy*  Lab Work: Please complete a CBC and a BMET in our first floor lab before you leave today.  If you have labs (blood work) drawn today and your tests are completely normal, you will receive your results only by: MyChart Message (if you have MyChart) OR A paper copy in the mail If you have any lab test that is abnormal or we need to change your treatment, we will call you to review the results.  Testing/Procedures: None.  Follow-Up: At [facility], you and your health needs are our priority.  As part of our continuing mission to provide you with exceptional heart care, our providers are all part of one team.  This team includes your primary Cardiologist (physician) and Advanced Practice Providers or APPs (Physician Assistants and Nurse Practitioners) who all work together to provide you with the care you need, when you need it.  Your next appointment:   1 year(s)  Provider:   [name] Bihari, MD

## 2024-10-14 ENCOUNTER — Ambulatory Visit: Payer: Self-pay | Admitting: Cardiology

## 2024-10-18 ENCOUNTER — Encounter: Payer: Self-pay | Admitting: Internal Medicine

## 2024-10-18 ENCOUNTER — Ambulatory Visit (INDEPENDENT_AMBULATORY_CARE_PROVIDER_SITE_OTHER): Admitting: Obstetrics and Gynecology

## 2024-10-18 VITALS — BP 116/75 | HR 73

## 2024-10-18 DIAGNOSIS — N393 Stress incontinence (female) (male): Secondary | ICD-10-CM

## 2024-10-18 DIAGNOSIS — N8110 Cystocele, unspecified: Secondary | ICD-10-CM

## 2024-10-18 DIAGNOSIS — R948 Abnormal results of function studies of other organs and systems: Secondary | ICD-10-CM

## 2024-10-18 DIAGNOSIS — N3946 Mixed incontinence: Secondary | ICD-10-CM

## 2024-10-18 DIAGNOSIS — N3281 Overactive bladder: Secondary | ICD-10-CM | POA: Diagnosis not present

## 2024-10-18 DIAGNOSIS — R350 Frequency of micturition: Secondary | ICD-10-CM

## 2024-10-18 LAB — POCT URINALYSIS DIP (CLINITEK)
Bilirubin, UA: NEGATIVE
Blood, UA: NEGATIVE
Glucose, UA: NEGATIVE mg/dL
Ketones, POC UA: NEGATIVE mg/dL
Leukocytes, UA: NEGATIVE
Nitrite, UA: NEGATIVE
POC PROTEIN,UA: NEGATIVE
Spec Grav, UA: 1.01 (ref 1.010–1.025)
Urobilinogen, UA: 0.2 U/dL
pH, UA: 6 (ref 5.0–8.0)

## 2024-10-18 NOTE — Progress Notes (Signed)
 [facility] Urodynamics Procedure  Referring Physician: Charlett Apolinar POUR, MD Date of Procedure: 10/18/2024  Nichole Smith is a 68 y.o. female who presents for urodynamic evaluation. Indication(s) for study: mixed incontinence  Vital Signs: BP 116/75   Pulse 73   LMP 02/12/2011   Laboratory Results: A catheterized urine specimen revealed:  POC urine:  Lab Results  Component Value Date   COLORU yellow 10/18/2024   CLARITYU clear 10/18/2024   GLUCOSEUR negative 10/18/2024   BILIRUBINUR negative 10/18/2024   KETONESU neg 07/25/2024   SPECGRAV 1.010 10/18/2024   RBCUR negative 10/18/2024   PHUR 6.0 10/18/2024   PROTEINUR Negative 07/25/2024   UROBILINOGEN 0.2 10/18/2024   LEUKOCYTESUR Negative 10/18/2024     Voiding Diary: Deferred  Procedure Timeout:  The correct patient was verified and the correct procedure was verified. The patient was in the correct position and safety precautions were reviewed based on at the patient's history.  Urodynamic Procedure A 525F dual lumen urodynamics catheter was placed under sterile conditions into the patient's bladder. A 525F catheter was placed into the rectum in order to measure abdominal pressure. EMG patches were placed in the appropriate position.  All connections were confirmed and calibrations/adjusted made. Saline was instilled into the bladder through the dual lumen catheters.  Cough/valsalva pressures were measured periodically during filling.  Patient was allowed to void.  The bladder was then emptied of its residual.  UROFLOW: Revealed a Qmax of 50 mL/sec.  She voided 444 mL and had a residual of 50 mL.  It was a normal pattern and represented normal habits.  CMG: This was performed with sterile water in the sitting position at a fill rate of 30-40 mL/min.    First sensation of fullness was 66 mLs,  First urge was 178 mLs,  Strong urge was 206 mLs and  Capacity was 419 mLs  Stress incontinence was  demonstrated Highest positive Barrier CLPP was 111 cmH20 at . Highest positive Barrier VLPP was 90 cmH20 at 179 ml.  Detrusor function was overreactive, with phasic contractions seen.  The first occurred at 136 mL to 21 cm of water and was associated with urge.  Compliance:  Normal. End fill detrusor pressure was 0cmH20.    UPP: MUCP with barrier reduction was 60 cm of water.    MICTURITION STUDY: Voiding was performed with reduction using scopettes in the sitting position.  Pdet at Qmax was 7 cm of water.  Qmax was 18 mL/sec.  It was a normal pattern.  She voided 419 mL and had a residual of 0 mL.  It was a volitional void, sustained detrusor contraction was present and abdominal straining was not present  EMG: This was performed with patches.  She had voluntary contractions, recruitment with fill was present and urethral sphincter was not relaxed with void.  The details of the procedure with the study tracings have been scanned into EPIC.   Urodynamic Impression:  1. Sensation was normal; capacity was normal 2. Stress Incontinence was demonstrated at normal pressures; 3. Detrusor Overactivity was demonstrated. 4. Emptying was dysfunctional with a normal PVR, a sustained detrusor contraction present,  abdominal straining not present, dyssynergic urethral sphincter activity on EMG.  Plan: - The patient will follow up  to discuss the findings and treatment options.

## 2024-10-19 ENCOUNTER — Ambulatory Visit (HOSPITAL_BASED_OUTPATIENT_CLINIC_OR_DEPARTMENT_OTHER)
Admission: RE | Admit: 2024-10-19 | Discharge: 2024-10-19 | Disposition: A | Discharge status: Home / Self Care | Source: Ambulatory Visit | Attending: Internal Medicine | Admitting: Internal Medicine

## 2024-10-19 ENCOUNTER — Other Ambulatory Visit (INDEPENDENT_AMBULATORY_CARE_PROVIDER_SITE_OTHER)

## 2024-10-19 DIAGNOSIS — Z79899 Other long term (current) drug therapy: Secondary | ICD-10-CM

## 2024-10-19 DIAGNOSIS — D3A090 Benign carcinoid tumor of the bronchus and lung: Secondary | ICD-10-CM | POA: Insufficient documentation

## 2024-10-19 DIAGNOSIS — I10 Essential (primary) hypertension: Secondary | ICD-10-CM

## 2024-10-19 DIAGNOSIS — E876 Hypokalemia: Secondary | ICD-10-CM

## 2024-10-19 DIAGNOSIS — R7401 Elevation of levels of liver transaminase levels: Secondary | ICD-10-CM

## 2024-10-19 LAB — HEPATIC FUNCTION PANEL
ALT: 26 U/L (ref 0–35)
AST: 24 U/L (ref 0–37)
Albumin: 3.9 g/dL (ref 3.5–5.2)
Alkaline Phosphatase: 52 U/L (ref 39–117)
Bilirubin, Direct: 0.2 mg/dL (ref 0.0–0.3)
Total Bilirubin: 0.8 mg/dL (ref 0.2–1.2)
Total Protein: 7.1 g/dL (ref 6.0–8.3)

## 2024-10-19 LAB — C-REACTIVE PROTEIN: CRP: <0.5 mg/dL (ref 0.5–20.0)

## 2024-10-19 NOTE — Telephone Encounter (Signed)
 Spoke to pt. She state she had BMP and CBC on 11/13. Inform pt we will let the lab know. Place a note on lab  do not release CBC and BMP. Pt had it done on 11/13.

## 2024-10-20 ENCOUNTER — Ambulatory Visit: Payer: Self-pay | Admitting: Internal Medicine

## 2024-10-20 NOTE — Progress Notes (Signed)
 Repeat lfts normal  ana pending  can discuss at fu

## 2024-10-22 LAB — HEPATITIS C ANTIBODY: Hepatitis C Ab: NONREACTIVE

## 2024-10-22 LAB — ANTI-NUCLEAR AB-TITER (ANA TITER)
ANA TITER: 1:40 {titer} — ABNORMAL HIGH
ANA TITER: 1:80 {titer} — ABNORMAL HIGH
ANA Titer 1: 1:80 {titer} — ABNORMAL HIGH

## 2024-10-22 LAB — ANA: Anti Nuclear Antibody (ANA): POSITIVE — AB

## 2024-10-24 ENCOUNTER — Telehealth (HOSPITAL_BASED_OUTPATIENT_CLINIC_OR_DEPARTMENT_OTHER): Payer: Self-pay | Admitting: *Deleted

## 2024-10-24 ENCOUNTER — Ambulatory Visit: Admitting: Obstetrics and Gynecology

## 2024-10-24 ENCOUNTER — Encounter: Payer: Self-pay | Admitting: Obstetrics and Gynecology

## 2024-10-24 VITALS — BP 119/73 | HR 70

## 2024-10-24 DIAGNOSIS — Z01818 Encounter for other preprocedural examination: Secondary | ICD-10-CM

## 2024-10-24 DIAGNOSIS — N812 Incomplete uterovaginal prolapse: Secondary | ICD-10-CM | POA: Diagnosis not present

## 2024-10-24 DIAGNOSIS — I10 Essential (primary) hypertension: Secondary | ICD-10-CM

## 2024-10-24 DIAGNOSIS — N3281 Overactive bladder: Secondary | ICD-10-CM | POA: Diagnosis not present

## 2024-10-24 DIAGNOSIS — N393 Stress incontinence (female) (male): Secondary | ICD-10-CM | POA: Diagnosis not present

## 2024-10-24 DIAGNOSIS — G4733 Obstructive sleep apnea (adult) (pediatric): Secondary | ICD-10-CM

## 2024-10-24 DIAGNOSIS — E785 Hyperlipidemia, unspecified: Secondary | ICD-10-CM

## 2024-10-24 DIAGNOSIS — I2510 Atherosclerotic heart disease of native coronary artery without angina pectoris: Secondary | ICD-10-CM

## 2024-10-24 NOTE — Progress Notes (Signed)
 [facility] Return Visit  SUBJECTIVE  History of Present Illness: Nichole Smith is a 68 y.o. female seen in follow-up for mixed incontinence and stage II POP. Initially seen by Dr Guadlupe. She underwent urodynamic testing.   Urodynamic Impression:  1. Sensation was normal; capacity was normal 2. Stress Incontinence was demonstrated at normal pressures; 3. Detrusor Overactivity was demonstrated. 4. Emptying was dysfunctional with a normal PVR, a sustained detrusor contraction present,  abdominal straining not present, dyssynergic urethral sphincter activity on EMG.  She swims a lot and can't always make it back to the bathroom. It is bothersome to her how frequently she urinates. SUI symptoms are more rare.   Has infrequent PMB, evaluated by Dr Cleotilde. Had hysteroscopy in June 2024 and had negative EMB again on 07/27/24. Negative pap smears.   Past Medical History: Patient  has a past medical history of Arthritis, Bursitis, Carcinoid tumor of lung (HCC) (2012), Coronary artery calcification (07/23/2016), Dysrhythmia, Frequent UTI, Hypertension, Hypothyroidism, Infertility, female, Leukocytoclastic vasculitis (HCC) (05/26/2011), Obstructive sleep apnea (07/27/2014), Osteoarthritis, PCOS (polycystic ovarian syndrome), PMB (postmenopausal bleeding), SUI (stress urinary incontinence, female), and Wears glasses.   Past Surgical History: She  has a past surgical history that includes Cesarean section; Shoulder open rotator cuff repair (07/20/2010); Lung tumor removed (2012); Dilatation & currettage/hysteroscopy with resectoscope (N/A, 07/18/2013); Wisdom tooth extraction; Mouth surgery; Colonoscopy (2021); Anterior cervical decomp/discectomy fusion (12/24/2017); Carpal tunnel release (Left, 12/24/2017); Polypectomy; implantable loop recorder placement (05/20/2021); Hysteroscopy with D & C (N/A, 05/05/2023); and Cataract extraction (Bilateral).   Medications: She has a current medication  list which includes the following prescription(s): alendronate , atorvastatin , calcium , chlorthalidone , diltiazem , diltiazem , doxepin  hcl, estradiol , gemtesa , metoprolol  succinate, multiple vitamins-minerals, phenazopyridine , potassium chloride  sa, probiotic product, synthroid , tirzepatide , xarelto , and journavx .   Allergies: Patient is allergic to neomycin and ace inhibitors.   Social History: Patient  reports that she has never smoked. She has never used smokeless tobacco. She reports that she does not currently use alcohol. She reports that she does not use drugs.     OBJECTIVE     Physical Exam: Vitals:   10/24/24 1008  BP: 119/73  Pulse: 70   Gen: No apparent distress, A&O x 3.  Detailed Urogynecologic Evaluation:  Normal external genitalia. On speculum, normal vaginal mucosa and normal appearing cervix. On bimanual, uterus is small, mobile and nontender.   POP-Q  0                                            Aa   0                                           Ba  -5                                              C   4                                            Gh  5  Pb  7                                            tvl   0.5                                            Ap  0.5                                            Bp  -5.5                                              D      ASSESSMENT AND PLAN    Nichole Smith is a 68 y.o. with:  1. Uterovaginal prolapse, incomplete   2. SUI (stress urinary incontinence, female)   3. Overactive bladder     - We discussed in detail options for OAB including PTNS, SNM and Botox. We discussed that OAB symptoms are not likely to improve with prolapse repair and require separate treatment. She is deciding between botox and SNM. Botox can be done at the time of prolapse repair, but will need in office repeat injections about every 6 months. She will review information and let us  know how she  wants to proceed.   Plan for surgery: Exam under anesthesia, total vaginal hysterectomy with bilateral salpingo-oophorectomy, uterosacral ligament suspension, anterior and posterior repair with perineorrhaphy, midurethral sling, cystoscopy  - We reviewed the patient's specific anatomic and functional findings, with the assistance of diagrams, and together finalized the above procedure. The planned surgical procedures were discussed along with the surgical risks outlined below, which were also provided on a detailed handout. Additional treatment options including expectant management, conservative management, medical management were discussed where appropriate.  We reviewed the benefits and risks of each treatment option.   General Surgical Risks: For all procedures, there are risks of bleeding, infection, damage to surrounding organs including but not limited to bowel, bladder, blood vessels, ureters and nerves, and need for further surgery if an injury were to occur. These risks are all low with minimally invasive surgery.   There are risks of numbness and weakness at any body site or buttock/rectal pain.  It is possible that baseline pain can be worsened by surgery, either with or without mesh. If surgery is vaginal, there is also a low risk of possible conversion to laparoscopy or open abdominal incision where indicated. Very rare risks include blood transfusion, blood clot, heart attack, pneumonia, or death.   There is also a risk of short-term postoperative urinary retention with need to use a catheter. About half of patients need to go home from surgery with a catheter, which is then later removed in the office. The risk of long-term need for a catheter is very low. There is also a risk of worsening of overactive bladder.   Sling: The effectiveness of a midurethral vaginal mesh sling is approximately 85%, and thus, there will be times when you may leak urine after surgery, especially if  your  bladder is full or if you have a strong cough. There is a balance between making the sling tight enough to treat your leakage but not too tight so that you have long-term difficulty emptying your bladder. A mesh sling will not directly treat overactive bladder/urge incontinence and may worsen it.  There is an FDA safety notification on vaginal mesh procedures for prolapse but NOT mesh slings. We have extensive experience and training with mesh placement and we have close postoperative follow up to identify any potential complications from mesh. It is important to realize that this mesh is a permanent implant that cannot be easily removed. There are rare risks of mesh exposure (2-4%), pain with intercourse (0-7%), and infection (<1%). The risk of mesh exposure if more likely in a woman with risks for poor healing (prior radiation, poorly controlled diabetes, or immunocompromised). The risk of new or worsened chronic pain after mesh implant is more common in women with baseline chronic pain and/or poorly controlled anxiety or depression. Approximately 2-4% of patients will experience longer-term post-operative voiding dysfunction that may require surgical revision of the sling. We also reviewed that postoperatively, her stream may not be as strong as before surgery.   Prolapse (with or without mesh): Risk factors for surgical failure  include things that put pressure on your pelvis and the surgical repair, including obesity, chronic cough, and heavy lifting or straining (including lifting children or adults, straining on the toilet, or lifting heavy objects such as furniture or anything weighing >25 lbs. Risks of recurrence is 20-30% with vaginal native tissue repair and a less than 10% with sacrocolpopexy with mesh.     - For preop Visit:  She is required to have a visit within 30 days of her surgery.    - Medical clearance: required by CARDIOLOGY and PULMONOLOGY Letter sent to Dr Shlomo ([facility]) and Dr  Jude [facility]) requesting risk stratification and medical optimization. - Anticoagulant use: Yes- xarelto  - Medicaid Hysterectomy form: No - Accepts blood transfusion: Yes - Expected length of stay: outpatient  Request sent for surgery scheduling.   Rosaline LOISE Caper, MD

## 2024-10-24 NOTE — Telephone Encounter (Signed)
   Pre-operative Risk Assessment    Patient Name: Nichole Smith  DOB: [birth year] MRN: [id number]   Date of last office visit: 10/13/24 DR. TURNER Date of next office visit: 11/26  DR. CAMNITZ  Request for Surgical Clearance    Procedure:  total vaginal hysterectomy with bilateral salpingo-oophorectomy, uterosacral ligament suspension, anterior and posterior repair with perineorrhaphy, midurethral sling, cystoscopy  Date of Surgery:  Clearance TBD                                Surgeon:  DR. ROSALINE CAPER Surgeon's Group or Practice Name:  [facility] Phone number:  [phone] Fax number:  [phone]   Type of Clearance Requested:   - Medical  - Pharmacy:  Hold Rivaroxaban  (Xarelto )     Type of Anesthesia:  General    Additional requests/questions:    Nichole Smith   10/24/2024, 5:20 PM  --

## 2024-10-25 NOTE — Telephone Encounter (Signed)
 I will place the order for test and route to scheduler to reach out to the pt with an appt.    I asked Newell SQUIBB. RN if she could help me place the orders being requested as the order set has changed. Melinda placed the orders and sent a staff message to [facility] PET Scan and to Katlyn  Causey to reach out to the pt to schedule appt.   Attestation has been sent to Dr. Shlomo to sign before the test can be scheduled.   Thank you Newell for your help in this new process.   I will update the requesting provider as FYI the pt will need testing before being cleared.

## 2024-10-25 NOTE — Progress Notes (Unsigned)
 Nichole Smith [address] [place] [address] Phone: [phone] Subjective:   Nichole Smith, am serving as a scribe for Dr. Arthea Smith.  I'm seeing this patient by the request  of:  Nichole Smith, Nichole POUR, MD  CC: Left knee, low back bilateral hip but worsening arm pain and neck pain  YEP:Dlagzrupcz  08/25/2024  Continues to have some difficulty.  Unlikely did improve significantly with the epidural.  Still having no unfortunately weakness with dorsiflexion of the foot that we will need to monitor.  Discussed proper shoes.  Worsening condition will need to consider the possibility of an AFO and nerve conduction study.  Patient is continuing to try to stay as active as possible.     Injection given today.  Tolerated the procedure well.  Hopeful that this will make some difference.  Will get x-rays to further evaluate.  Discussed icing regimen and home exercises, I discussed the importance of weight loss and patient has done well but would like to get a BMI under 35.  Differential includes some of the lumbar radiculopathy and we do know that that is a concern with patient.  Also patient has had significant amount of ankle pathology and will get new shoes.      Update 10/26/2024 Nichole Smith is a 68 y.o. female coming in with complaint of L knee, LBP and B hip pain. Patient states back is still doing really well. Pain and discomfort in upper arms and neck area. This started Friday. L wrist started hurting last night and would like take on lab work.     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  Download 08/2014:  Good compliance, breakthru apnea on 10cm.  Changed to auto 5-12cm >> good control of AHI on f/u download ONO on CPAP:       Osteoarthritis    PCOS (polycystic ovarian syndrome)    PMB (postmenopausal bleeding)    SUI (stress urinary incontinence, female)    h/o   Wears glasses    Past Surgical History:  Procedure Laterality Date   ANTERIOR CERVICAL DECOMP/DISCECTOMY FUSION  12/24/2017   Procedure: Cervical five-six Cervical six-seven Anterior cervical decompression/discectomy/fusion;  Surgeon: Unice Pac, MD;  Location[facility] OR;  Service: Neurosurgery;;   CARPAL TUNNEL RELEASE Left 12/24/2017   Procedure: LEFT CARPAL TUNNEL RELEASE;  Surgeon: Unice Pac, MD;  Location: [facility] OR;  Service: Neurosurgery;  Laterality: Left;   CATARACT EXTRACTION Bilateral    Right eye 08/24/2023   CESAREAN SECTION     x 1 - twins   COLONOSCOPY  2021   DILATATION & CURRETTAGE/HYSTEROSCOPY WITH RESECTOCOPE N/A 07/18/2013   Procedure: DILATATION & CURETTAGE/HYSTEROSCOPY WITH RESECTOCOPE;  Surgeon: Ronal Elvie Pinal, MD;  Location: [facility] ORS;  Service: Gynecology;  Laterality: N/A;  mass resection   HYSTEROSCOPY WITH D & C N/A 05/05/2023   Procedure: DILATATION AND CURETTAGE /HYSTEROSCOPY;  Surgeon: Cleotilde Ronal RAMAN, MD;  Location: [facility];  Service: Gynecology;  Laterality: N/A;   implantable loop recorder placement  05/20/2021   Medtronic Reveal Linq model [id number] implantable loop recorder (SN [id number])   Lung tumor removed  2012   Rt lung carcinoid   MOUTH SURGERY     pre-cancerous ulcer removed   POLYPECTOMY     SHOULDER OPEN ROTATOR CUFF REPAIR  07/20/2010   Right   WISDOM TOOTH EXTRACTION     Social History   Socioeconomic History   Marital status: Married    Spouse name: Cheryl   Number of children: 2   Years of education: Not on  file   Highest education level: Bachelor's degree (e.g., BA, AB, BS)  Occupational History   Occupation: retired    Associate Professor: Museum/gallery Exhibitions Officer  Tobacco Use   Smoking status: Never   Smokeless tobacco: Never   Tobacco comments:    Never smoked 08/07/23  Vaping Use   Vaping status: Never Used  Substance and Sexual Activity   Alcohol use: Not Currently    Comment: occ   Drug use: No   Sexual activity: Not Currently    Partners: Male    Birth control/protection: Post-menopausal  Other Topics Concern   Not on file  Social History Narrative   hhof 2    2 Children at college and  beyond      [name]    Married    Retired age 63 2015   Recently moved taking care of grandchildren during the week.   Social Drivers of Corporate Investment Banker Strain: Low Risk  (05/28/2024)   Overall Financial Resource Strain (CARDIA)    Difficulty of Paying Living Expenses: Not hard at all  Food Insecurity: No Food Insecurity (05/28/2024)   Hunger Vital Sign    Worried About Running Out of Food in the Last Year: Never true    Ran Out of Food in the Last Year: Never true  Transportation Needs: No Transportation Needs (05/28/2024)   PRAPARE - Administrator, Civil Service (Medical): No    Lack of Transportation (Non-Medical): No  Physical Activity: Insufficiently Active (05/28/2024)   Exercise Vital Sign    Days of Exercise per Week: 4 days    Minutes of Exercise per Session: 30 min  Stress: No Stress Concern Present (05/28/2024)   [employer] of Occupational Health - Occupational Stress Questionnaire    Feeling of Stress: Only a little  Social Connections: Socially Integrated (05/28/2024)   Social Connection and Isolation Panel    Frequency of Communication with Friends and Family: Twice a week    Frequency of Social Gatherings with Friends and Family: Twice a week    Attends Religious Services: More than 4 times per year    Active Member of [employer] or  Organizations: Yes    Attends Engineer, Structural: More than 4 times per year    Marital Status: Married   Allergies  Allergen Reactions   Neomycin Other (See Comments)    Inflammation--eye drop    Ace Inhibitors Cough   Family History  Problem Relation Age of Onset   Stroke Mother        died age 62   Hypertension Mother    Sudden death Mother    Alcoholism Mother    Coronary artery disease Father        died age 52   Hypertension Father    Liver  disease Father    Alcohol abuse Father    Obesity Father    Parkinson's disease Brother    Diabetes Brother    Hypertension Brother    Hypertension Maternal Uncle    Colon cancer Neg Hx    Rectal cancer Neg Hx    Stomach cancer Neg Hx    Colon polyps Neg Hx    Esophageal cancer Neg Hx     Current Outpatient Medications (Endocrine & Metabolic):    alendronate  (FOSAMAX ) 70 MG tablet, Take 1 tablet (70 mg total) by mouth every 7 (seven) days. Take first thing in am with 6 oz. Water.  Be upright after taking.  Eat nothing for one hour.   SYNTHROID  125 MCG tablet, Take 1 tablet (125 mcg total) by mouth daily before breakfast. Dosage change   tirzepatide  (MOUNJARO ) 15 MG/0.5ML Pen, Inject 15 mg into the skin once a week.  Current Outpatient Medications (Cardiovascular):    atorvastatin  (LIPITOR) 40 MG tablet, Take 1 tablet (40 mg total) by mouth daily.   chlorthalidone  (HYGROTON ) 25 MG tablet, TAKE 1 TABLET DAILY   diltiazem  (CARDIZEM  CD) 180 MG 24 hr capsule, Take 1 capsule (180 mg total) by mouth daily.   diltiazem  (CARDIZEM ) 30 MG tablet, 1 tablet every 6 hours for palpitations   metoprolol  succinate (TOPROL -XL) 50 MG 24 hr tablet, Take 1 tablet (50 mg total) by mouth daily.   Current Outpatient Medications (Analgesics):    Suzetrigine  (JOURNAVX ) 50 MG TABS, Take 50 mg by mouth 2 (two) times daily. (Patient not taking: Reported on 10/24/2024)  Current Outpatient Medications (Hematological):    XARELTO  20 MG TABS  tablet, TAKE 1 TABLET DAILY WITH SUPPER  Current Outpatient Medications (Other):    CALCIUM  PO, Take 1 tablet by mouth at bedtime.    Doxepin  HCl 3 MG TABS, Take 1 tablet (3 mg total) by mouth at bedtime as needed.   estradiol  (ESTRACE ) 0.1 MG/GM vaginal cream, INSERT 1 GM VAGINALLY TWICE WEEKLY   GEMTESA  75 MG TABS, Take 1 tablet (75 mg total) by mouth daily as needed.   Multiple Vitamins-Minerals (PRESERVISION AREDS 2 PO), Take 1 tablet by mouth 2 (two) times daily.   phenazopyridine  (PYRIDIUM ) 200 MG tablet, Take 1 tablet (200 mg total) by mouth 3 (three) times daily as needed for pain.   potassium chloride  SA (KLOR-CON  M) 20 MEQ tablet, Take 1 tablet (20 mEq total) by mouth daily.   Probiotic Product (PROBIOTIC PO), Take by mouth.   Reviewed prior external information including notes and imaging from  primary care provider As well as notes that were available from care everywhere and other healthcare systems.  Past medical history, social, surgical and family history all reviewed in electronic medical record.  No pertanent information unless stated regarding to the chief complaint.   Review of Systems:  No headache, visual changes, nausea, vomiting, diarrhea, constipation, dizziness, abdominal pain, skin rash, fevers, chills, night sweats, weight loss, swollen lymph nodes, body aches, joint swelling, chest pain, shortness of breath, mood changes. POSITIVE muscle aches  Objective  Blood pressure 124/84, pulse 68, height 4' 11 (1.499 m), weight 182 lb (82.6 kg), last menstrual period 02/12/2011, SpO2 96%.   General: No apparent distress alert and oriented x3 mood and affect normal, dressed appropriately.  HEENT: Pupils equal, extraocular movements intact  Respiratory: Patient's speak in full sentences and does not appear short of breath  Cardiovascular: No lower extremity edema, non tender, no erythema  Limited range of motion noted of  the neck.  Lack of extension of the neck noted  today.  Patient does have some weakness in the C7 distribution of the hands bilaterally.  Deep tendon reflexes are 1+ in the tricep but symmetric.  Shoulder still have weakness with abduction with but seems to be symmetric.    Impression and Recommendations:      The above documentation has been reviewed and is accurate and complete Kataleena Holsapple M Imran Nuon, DO

## 2024-10-25 NOTE — Progress Notes (Unsigned)
 Electrophysiology Office Note:   Date:  10/26/2024  ID:  Nichole, Smith [birth year], MRN [id number]  Primary Cardiologist: [name] Bihari, MD Primary Heart Failure: None Electrophysiologist: Kristoff Coonradt Gladis Norton, MD      History of Present Illness:   Nichole Smith is a 68 y.o. 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   Discussed the use of AI scribe software for clinical note transcription with the patient, who gave verbal consent to proceed.  History of Present Illness Nichole Smith is a 68 year old female with atrial fibrillation and hypertension who presents for evaluation of anticoagulation management and pain control.  She has a history of atrial fibrillation with intermittent episodes, including a two-minute episode in April, a two-hour episode in January, and a two-hour and thirty-eight-minute episode in October, which prompted a call from the office due to a high heart rate. In August of the previous year, she experienced an eight-hour episode, later corrected to three hours, during which she felt her heart pounding while reading a newspaper. Her episodes are erratic and irregular, with most being short in duration. She is currently on Xarelto  for anticoagulation.  She is experiencing significant pain and reports that another physician suspects she may have Sjgren's disease. Extensive blood work revealed a positive [name]. She is seeking options for pain management compatible with her current medications.  Her past medical history includes spinal stenosis, which has been a source of chronic pain. She has been under the care of a sports medicine specialist for various injuries over the past 25 years.  She mentions a consistently high hemoglobin level, with a recent measurement of  16.6, slightly above the normal range. She has bleeding problems and an upcoming hysterectomy.   she denies chest pain, palpitations, dyspnea, PND, orthopnea, nausea, vomiting, dizziness, syncope, edema, weight gain, or early satiety.   Review of systems complete and found to be negative unless listed in HPI.   Device History: Medtronic loop recorder implanted for Atrial fibrillation  EP Information / Studies Reviewed:    EKG is not ordered today. EKG from 10/13/2024 reviewed which showed sinus rhythm        Risk Assessment/Calculations:    CHA2DS2-VASc Score = 4   This indicates a 4.8% annual risk of stroke. The patient's score is based upon: CHF History: 0 HTN History: 1 Diabetes History: 0 Stroke History: 0 Vascular Disease History: 1 Age Score: 1 Gender Score: 1             Physical Exam:   VS:  BP 120/84 (BP Location: Left Arm, Patient Position: Sitting, Cuff Size: Large)   Pulse 72   Ht 4' 11 (1.499 m)   Wt 181 lb (82.1 kg)   LMP 02/12/2011   SpO2 98%   BMI 36.56 kg/m    Wt Readings from Last 3 Encounters:  10/26/24 181 lb (82.1 kg)  10/26/24 182 lb (82.6 kg)  10/13/24 183 lb 3.2 oz (83.1 kg)     GEN: Well nourished, well developed in no acute distress NECK: No JVD; No carotid bruits CARDIAC: Regular rate and rhythm, no murmurs, rubs, gallops RESPIRATORY:  Clear to auscultation without rales, wheezing or rhonchi  ABDOMEN: Soft, non-tender, non-distended EXTREMITIES:  No edema; No deformity   ILR Interrogation- reviewed in detail today,  See PACEART report  ASSESSMENT AND PLAN:  Atrial fibrillation s/p Medtronic Loop recorder Normal device function See Pace Art report No changes today She is having significant pain issues and would like to possibly take NSAIDs.  Additionally, she is having workup for Sjogren syndrome.  Dina Mobley look into whether or not watchman implant would be reasonable.  2.  Hypertension: Well-controlled  3.  Obstructive  sleep apnea: CPAP compliance encouraged  4.  Secondary hypercoagulable state: On Xarelto   5.  Morbid obesity: Lifestyle modification encouraged     Follow up with EP Team in 12 months  Signed, Christalyn Goertz Gladis Norton, MD

## 2024-10-25 NOTE — Telephone Encounter (Signed)
 Called patient regarding cardiac PET stress recommendation per Dr. Shlomo for preoperative cardiac evaluation.  Patient is in agreement to proceed with cardiac PET stress, please see consent below.  Informed Consent   Shared Decision Making/Informed Consent The risks [chest pain, shortness of breath, cardiac arrhythmias, dizziness, blood pressure fluctuations, myocardial infarction, stroke/transient ischemic attack, nausea, vomiting, allergic reaction, radiation exposure, metallic taste sensation and life-threatening complications (estimated to be 1 in 10,000)], benefits (risk stratification, diagnosing coronary artery disease, treatment guidance) and alternatives of a cardiac PET stress test were discussed in detail with Ms. Henken and she agrees to proceed.      Wilhelmina Hark D Normalee Sistare, NP 10/25/2024, 12:53 PM

## 2024-10-26 ENCOUNTER — Ambulatory Visit: Admitting: Family Medicine

## 2024-10-26 ENCOUNTER — Ambulatory Visit: Attending: Internal Medicine | Admitting: Cardiology

## 2024-10-26 ENCOUNTER — Ambulatory Visit

## 2024-10-26 ENCOUNTER — Encounter: Payer: Self-pay | Admitting: Cardiology

## 2024-10-26 VITALS — BP 120/84 | HR 72 | Ht 59.0 in | Wt 181.0 lb

## 2024-10-26 VITALS — BP 124/84 | HR 68 | Ht 59.0 in | Wt 182.0 lb

## 2024-10-26 DIAGNOSIS — I480 Paroxysmal atrial fibrillation: Secondary | ICD-10-CM | POA: Diagnosis not present

## 2024-10-26 DIAGNOSIS — G4733 Obstructive sleep apnea (adult) (pediatric): Secondary | ICD-10-CM | POA: Insufficient documentation

## 2024-10-26 DIAGNOSIS — R7689 Other specified abnormal immunological findings in serum: Secondary | ICD-10-CM

## 2024-10-26 DIAGNOSIS — M4802 Spinal stenosis, cervical region: Secondary | ICD-10-CM | POA: Diagnosis not present

## 2024-10-26 DIAGNOSIS — I10 Essential (primary) hypertension: Secondary | ICD-10-CM | POA: Diagnosis not present

## 2024-10-26 DIAGNOSIS — D6869 Other thrombophilia: Secondary | ICD-10-CM | POA: Diagnosis not present

## 2024-10-26 MED ORDER — METHYLPREDNISOLONE ACETATE 40 MG/ML IJ SUSP
40.0000 mg | Freq: Once | INTRAMUSCULAR | Status: AC
  Administered 2024-10-26: 40 mg via INTRAMUSCULAR

## 2024-10-26 MED ORDER — KETOROLAC TROMETHAMINE 30 MG/ML IJ SOLN
30.0000 mg | Freq: Once | INTRAMUSCULAR | Status: AC
  Administered 2024-10-26: 30 mg via INTRAMUSCULAR

## 2024-10-26 NOTE — Assessment & Plan Note (Addendum)
 Known spinal stenosis and has had surgical intervention.  X-rays in June 2024 showed that there was some adjacent segment disease.  Will get repeat x-rays to further evaluate.  Discussed icing regimen.  Discussed which activities to do and which ones to avoid.  Increase activity slowly.  Follow-up again in 6 to 12 weeks otherwise.  Toradol  and Depo-Medrol  injections given today with the holidays coming up.  No other change in medical management with medications with patient seen her electrophysiologist later today.  They are discussing the possibility of removing the blood thinner and depending on that could allow for other anti-inflammatories.

## 2024-10-26 NOTE — Patient Instructions (Addendum)
 Xrays today Half cocktail injection in backside. (NO NSAIDS for 24 hrs) Ref Rheumatology See you again in 2 months

## 2024-10-26 NOTE — Telephone Encounter (Signed)
 Patient with diagnosis of afib on Xarelto  for anticoagulation.    Procedure:  total vaginal hysterectomy with bilateral salpingo-oophorectomy, uterosacral ligament suspension, anterior and posterior repair with perineorrhaphy, midurethral sling, cystoscopy   Date of procedure: TBD   CHA2DS2-VASc Score = 4   This indicates a 4.8% annual risk of stroke. The patient's score is based upon: CHF History: 0 HTN History: 1 Diabetes History: 0 Stroke History: 0 Vascular Disease History: 1 Age Score: 1 Gender Score: 1      CrCl 87 ml/min Platelet count 283  Patient has not had an Afib/aflutter ablation in the last 3 months, DCCV within the last 4 weeks or a watchman implanted in the last 45 days   Per office protocol, patient can hold Xarelto  for 2 days prior to procedure.    **This guidance is not considered finalized until pre-operative APP has relayed final recommendations.**

## 2024-10-26 NOTE — Patient Instructions (Signed)
 Medication Instructions:  Your physician recommends that you continue on your current medications as directed. Please refer to the Current Medication list given to you today.  *If you need a refill on your cardiac medications before your next appointment, please call your pharmacy*  Lab Work: None ordered  Testing/Procedures: None ordered  Follow-Up: At [facility], you and your health needs are our priority.  As part of our continuing mission to provide you with exceptional heart care, our providers are all part of one team.  This team includes your primary Cardiologist (physician) and Advanced Practice Providers or APPs (Physician Assistants and Nurse Practitioners) who all work together to provide you with the care you need, when you need it.  Your next appointment:   1 year(s)  Provider:   Soyla Norton, MD      Thank you for choosing [facility]!!   [phone]

## 2024-10-27 ENCOUNTER — Encounter

## 2024-10-29 ENCOUNTER — Ambulatory Visit: Attending: Cardiology

## 2024-10-29 DIAGNOSIS — I480 Paroxysmal atrial fibrillation: Secondary | ICD-10-CM | POA: Diagnosis not present

## 2024-10-31 LAB — CUP PACEART REMOTE DEVICE CHECK
Date Time Interrogation Session: 20251128230718
Implantable Pulse Generator Implant Date: 20220620

## 2024-11-02 ENCOUNTER — Encounter: Payer: Self-pay | Admitting: Internal Medicine

## 2024-11-02 ENCOUNTER — Other Ambulatory Visit: Payer: Self-pay | Admitting: Internal Medicine

## 2024-11-02 ENCOUNTER — Ambulatory Visit: Admitting: Internal Medicine

## 2024-11-02 VITALS — BP 134/70 | HR 65 | Temp 97.9°F | Ht 59.0 in | Wt 184.2 lb

## 2024-11-02 DIAGNOSIS — R7401 Elevation of levels of liver transaminase levels: Secondary | ICD-10-CM

## 2024-11-02 DIAGNOSIS — R7689 Other specified abnormal immunological findings in serum: Secondary | ICD-10-CM

## 2024-11-02 DIAGNOSIS — Z7901 Long term (current) use of anticoagulants: Secondary | ICD-10-CM

## 2024-11-02 DIAGNOSIS — I480 Paroxysmal atrial fibrillation: Secondary | ICD-10-CM

## 2024-11-02 DIAGNOSIS — I10 Essential (primary) hypertension: Secondary | ICD-10-CM

## 2024-11-02 DIAGNOSIS — H04123 Dry eye syndrome of bilateral lacrimal glands: Secondary | ICD-10-CM

## 2024-11-02 DIAGNOSIS — Z79899 Other long term (current) drug therapy: Secondary | ICD-10-CM

## 2024-11-02 DIAGNOSIS — Z8379 Family history of other diseases of the digestive system: Secondary | ICD-10-CM

## 2024-11-02 MED ORDER — SYNTHROID 125 MCG PO TABS: 125.0000 ug | ORAL_TABLET | Freq: Every day | ORAL | 3 refills | Status: AC

## 2024-11-02 NOTE — Progress Notes (Signed)
 Chief Complaint  Patient presents with   Medical Management of Chronic Issues    Going over blood work.     HPI: Patient  Nichole Smith  68 y.o. comes in today for fu all labs and  abnormalities to review  Abn lfts  tylenol  is only med she can take for her pain . 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 Under rx for osa  but hg still elevated  had remote eval  from heme in past   neg epo and jak2 neg seen 2015   Health Maintenance  Topic Date Due   Medicare Annual Wellness (AWV)  11/13/2023   DTaP/Tdap/Td (4 - Td or Tdap) 01/31/2024   COVID-19 Vaccine (12 - Moderna risk 2025-26 season) 04/17/2025   Mammogram  12/21/2025   Colonoscopy  11/11/2026   Pneumococcal Vaccine: 50+ Years  Completed   Influenza Vaccine  Completed   Bone Density Scan  Completed   Hepatitis C Screening  Completed   Zoster Vaccines- Shingrix  Completed   Meningococcal B Vaccine  Aged Out    ROS:  GEN/ HEENT: No fever, significant weight changes sweats headaches vision problems hearing changes, CV/ PULM; No chest pain shortness of breath cough, syncope,edema  change in exercise tolerance. GI /GU: No adominal pain, vomiting, change in bowel habits. No blood in the stool.  SKIN/HEME: ,no acute skin rashes suspicious lesions or bleeding. No lymphadenopathy, nodules, masses.  NEURO/ PSYCH:  No neurologic signs such as weakness numbness. No depression anxiety. IMM/ Allergy: No unusual infections.  Allergy .    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  Download 08/2014:  Good compliance, breakthru apnea on 10cm.  Changed to auto 5-12cm >> good control of AHI on f/u download ONO on CPAP:       Osteoarthritis    PCOS (polycystic ovarian syndrome)    PMB (postmenopausal bleeding)    SUI (stress urinary incontinence, female)    h/o   Wears glasses     Past Surgical History:  Procedure Laterality Date   ANTERIOR CERVICAL DECOMP/DISCECTOMY FUSION  12/24/2017   Procedure: Cervical five-six Cervical six-seven Anterior cervical decompression/discectomy/fusion;  Surgeon: Unice Pac, MD;  Location: [facility] OR;  Service: Neurosurgery;;   CARPAL TUNNEL RELEASE Left 12/24/2017   Procedure: LEFT CARPAL TUNNEL RELEASE;  Surgeon: Unice Pac, MD;  [facility] OR;  Service: Neurosurgery;  Laterality: Left;   CATARACT EXTRACTION Bilateral    Right eye 08/24/2023   CESAREAN SECTION     x 1 - twins   COLONOSCOPY  2021   DILATATION & CURRETTAGE/HYSTEROSCOPY WITH RESECTOCOPE N/A 07/18/2013   Procedure: DILATATION & CURETTAGE/HYSTEROSCOPY WITH RESECTOCOPE;  Surgeon: Ronal Elvie Pinal, MD;  Location: [facility] ORS;  Service: Gynecology;  Laterality: N/A;  mass resection   HYSTEROSCOPY WITH D & C N/A 05/05/2023   Procedure: DILATATION AND CURETTAGE /HYSTEROSCOPY;  Surgeon: Pinal Ronal RAMAN, MD;  Location: [facility];  Service: Gynecology;  Laterality: N/A;   implantable  loop recorder placement  05/20/2021   Medtronic Reveal Linq model [id number] implantable loop recorder (SN [id number])   Lung tumor removed  2012   Rt lung carcinoid   MOUTH SURGERY     pre-cancerous ulcer removed   POLYPECTOMY     SHOULDER OPEN ROTATOR CUFF REPAIR  07/20/2010   Right   WISDOM TOOTH EXTRACTION      Family History  Problem Relation Age of Onset   Stroke Mother        died age 71   Hypertension Mother    Sudden death Mother    Alcoholism Mother    Coronary artery disease Father        died age 33   Hypertension Father    Liver disease Father    Alcohol abuse Father    Obesity Father    Parkinson's disease Brother    Diabetes Brother    Hypertension Brother    Hypertension Maternal Uncle    Colon cancer Neg Hx    Rectal cancer Neg Hx    Stomach cancer Neg Hx    Colon polyps Neg Hx    Esophageal cancer Neg Hx     Social History   Socioeconomic History   Marital status: Married    Spouse name: Cheryl   Number of children: 2   Years of education: Not on file   Highest education level: Bachelor's degree (e.g., BA, AB, BS)  Occupational History   Occupation: retired    Associate Professor: Museum/gallery Exhibitions Officer  Tobacco Use   Smoking status: Never   Smokeless tobacco: Never   Tobacco comments:    Never smoked 08/07/23  Vaping Use   Vaping status: Never Used  Substance and Sexual Activity   Alcohol use: Not Currently    Comment: occ   Drug use: No   Sexual activity: Not Currently    Partners: Male    Birth control/protection: Post-menopausal  Other Topics Concern   Not on file  Social History Narrative   hhof 2    2 Children at college and  beyond      [name]    Married    Retired age 16 2015   Recently moved taking care of grandchildren during the week.   Social Drivers of Corporate Investment Banker Strain: Low Risk  (11/01/2024)   Overall Financial Resource Strain (CARDIA)    Difficulty of Paying Living Expenses: Not hard at all   Food Insecurity: No Food Insecurity (11/01/2024)   Hunger Vital Sign    Worried About Running Out of Food in the Last Year: Never true    Ran Out of Food in the Last Year: Never true  Transportation Needs: No Transportation Needs (  11/01/2024)   PRAPARE - Administrator, Civil Service (Medical): No    Lack of Transportation (Non-Medical): No  Physical Activity: Sufficiently Active (11/01/2024)   Exercise Vital Sign    Days of Exercise per Week: 5 days    Minutes of Exercise per Session: 30 min  Stress: No Stress Concern Present (11/01/2024)   [employer] of Occupational Health - Occupational Stress Questionnaire    Feeling of Stress: Only a little  Social Connections: Socially Integrated (11/01/2024)   Social Connection and Isolation Panel    Frequency of Communication with Friends and Family: Once a week    Frequency of Social Gatherings with Friends and Family: More than three times a week    Attends Religious Services: More than 4 times per year    Active Member of [employer] or Organizations: Yes    Attends Engineer, Structural: More than 4 times per year    Marital Status: Married    Outpatient Medications Prior to Visit  Medication Sig Dispense Refill   alendronate  (FOSAMAX ) 70 MG tablet Take 1 tablet (70 mg total) by mouth every 7 (seven) days. Take first thing in am with 6 oz. Water.  Be upright after taking.  Eat nothing for one hour. 13 tablet 3   atorvastatin  (LIPITOR) 40 MG tablet Take 1 tablet (40 mg total) by mouth daily. 90 tablet 3   CALCIUM  PO Take 1 tablet by mouth at bedtime.      chlorthalidone  (HYGROTON ) 25 MG tablet TAKE 1 TABLET DAILY 90 tablet 3   diltiazem  (CARDIZEM  CD) 180 MG 24 hr capsule Take 1 capsule (180 mg total) by mouth daily. 90 each 3   diltiazem  (CARDIZEM ) 30 MG tablet 1 tablet every 6 hours for palpitations 60 tablet 3   Doxepin  HCl 3 MG TABS Take 1 tablet (3 mg total) by mouth at bedtime as needed. 30 tablet 2   estradiol   (ESTRACE ) 0.1 MG/GM vaginal cream INSERT 1 GM VAGINALLY TWICE WEEKLY 42.5 g 3   GEMTESA  75 MG TABS Take 1 tablet (75 mg total) by mouth daily as needed. 90 tablet 3   metoprolol  succinate (TOPROL -XL) 50 MG 24 hr tablet Take 1 tablet (50 mg total) by mouth daily. 90 tablet 2   Multiple Vitamins-Minerals (PRESERVISION AREDS 2 PO) Take 1 tablet by mouth 2 (two) times daily.     phenazopyridine  (PYRIDIUM ) 200 MG tablet Take 1 tablet (200 mg total) by mouth 3 (three) times daily as needed for pain. 10 tablet 0   potassium chloride  SA (KLOR-CON  M) 20 MEQ tablet Take 1 tablet (20 mEq total) by mouth daily. 90 tablet 1   Probiotic Product (PROBIOTIC PO) Take by mouth.     tirzepatide  (MOUNJARO ) 15 MG/0.5ML Pen Inject 15 mg into the skin once a week. 2 mL 2   XARELTO  20 MG TABS tablet TAKE 1 TABLET DAILY WITH SUPPER 90 tablet 3   SYNTHROID  125 MCG tablet Take 1 tablet (125 mcg total) by mouth daily before breakfast. Dosage change 90 tablet 3   Suzetrigine  (JOURNAVX ) 50 MG TABS Take 50 mg by mouth 2 (two) times daily. (Patient not taking: Reported on 11/02/2024) 180 tablet 0   No facility-administered medications prior to visit.     EXAM:  BP 134/70 (BP Location: Left Arm, Patient Position: Sitting, Cuff Size: Large)   Pulse 65   Temp 97.9 F (36.6 C) (Oral)   Ht 4' 11 (1.499 m)   Wt 184 lb 3.2 oz (  83.6 kg)   LMP 02/12/2011   SpO2 97%   BMI 37.20 kg/m   Body mass index is 37.2 kg/m. Wt Readings from Last 3 Encounters:  11/02/24 184 lb 3.2 oz (83.6 kg)  10/26/24 181 lb (82.1 kg)  10/26/24 182 lb (82.6 kg)    Physical Exam: Vital signs reviewed HZW:Uypd is a well-developed well-nourished alert cooperative    who appearsr stated age in no acute distress.  NEURO:  Oriented x3, cranial nerves 3-12 appear to be intact, no obvious focal weakness,gait within normal limits no abnormal reflexes or asymmetrical SKIN: No acute rashes normal turgor, color, no bruising or petechiae. PSYCH:  Oriented, good eye contact, no obvious depression anxiety, cognition and judgment appear normal. Wt Readings from Last 3 Encounters:  11/02/24 184 lb 3.2 oz (83.6 kg)  10/26/24 181 lb (82.1 kg)  10/26/24 182 lb (82.6 kg)   BP Readings from Last 3 Encounters:  11/02/24 134/70  10/26/24 120/84  10/26/24 124/84  134/64   Lab Results  Component Value Date   WBC 10.0 10/13/2024   HGB 16.6 (H) 10/13/2024   HCT 49.8 (H) 10/13/2024   PLT 283 10/13/2024   GLUCOSE 75 10/13/2024   CHOL 129 08/17/2024   TRIG 44 08/17/2024   HDL 57 08/17/2024   LDLCALC 61 08/17/2024   ALT 26 10/19/2024   AST 24 10/19/2024   NA 138 10/13/2024   K 3.7 10/13/2024   CL 97 10/13/2024   CREATININE 0.56 (L) 10/13/2024   BUN 17 10/13/2024   CO2 28 10/13/2024   TSH 1.270 08/17/2024   INR 0.96 06/11/2011   HGBA1C 5.3 08/17/2024    BP Readings from Last 3 Encounters:  11/02/24 134/70  10/26/24 120/84  10/26/24 124/84   Lab reviewed   ASSESSMENT AND PLAN:  Discussed the following assessment and plan:    ICD-10-CM   1. Positive [name] (antinuclear antibody)  R76.89     2. Elevated ALT measurement  R74.01    better today  fam hx of end stage cirrhosis    3. Obesity (HCC)- Starting BMI- 46.5  E66.01     4. Paroxysmal atrial fibrillation (HCC)  I48.0     5. Chronic anticoagulation  Z79.01     6. Dry eyes  H04.123     I agree with rheum consult  Liver panel  now normal but with family hx of cirrhosis will do liver us  and also other labs  [facility]  other  see lab set orders  No follow-ups on file.  Patient Care Team: Vicki Pasqual, Apolinar POUR, MD as PCP - General (Internal Medicine) [name], MD as PCP - Cardiology (Cardiology) Inocencio Soyla Lunger, MD as PCP - Electrophysiology (Cardiology) Robinson Pao, MD (Dermatology) Cleotilde Ronal RAMAN, MD as Attending Physician (Gynecology) Shari Sieving, MD as Attending Physician (Orthopedic Surgery) Jude Harden GAILS, MD as Consulting Physician (Pulmonary  Disease) Patient Instructions  Will do record review about  further lab s   and liver US  .   Will send you message when done ( fam hx of cirrhosis)  and elevated  hg  and fu potassium off the supplement .   Mailey Landstrom K. Amir Glaus M.D.

## 2024-11-02 NOTE — Addendum Note (Signed)
 Addended byBETHA CHARLETT APOLINAR MARLA on: 11/02/2024 07:46 PM   Modules accepted: Orders

## 2024-11-02 NOTE — Progress Notes (Signed)
 Future labs  for lfts  abnormality and ana And fam hx of cirrhosis

## 2024-11-02 NOTE — Patient Instructions (Signed)
 Will do record review about  further lab s   and liver US  .   Will send you message when done ( fam hx of cirrhosis)  and elevated  hg  and fu potassium off the supplement .

## 2024-11-02 NOTE — Progress Notes (Signed)
 Remote Loop Recorder Transmission

## 2024-11-04 ENCOUNTER — Encounter: Payer: Self-pay | Admitting: Obstetrics and Gynecology

## 2024-11-04 ENCOUNTER — Ambulatory Visit: Payer: Self-pay | Admitting: Family Medicine

## 2024-11-10 ENCOUNTER — Ambulatory Visit (HOSPITAL_BASED_OUTPATIENT_CLINIC_OR_DEPARTMENT_OTHER)

## 2024-11-10 ENCOUNTER — Ambulatory Visit (HOSPITAL_BASED_OUTPATIENT_CLINIC_OR_DEPARTMENT_OTHER): Admission: RE | Admit: 2024-11-10 | Discharge: 2024-11-10 | Attending: Internal Medicine | Admitting: Internal Medicine

## 2024-11-10 DIAGNOSIS — R7689 Other specified abnormal immunological findings in serum: Secondary | ICD-10-CM | POA: Insufficient documentation

## 2024-11-10 DIAGNOSIS — R7401 Elevation of levels of liver transaminase levels: Secondary | ICD-10-CM | POA: Insufficient documentation

## 2024-11-10 DIAGNOSIS — Z8379 Family history of other diseases of the digestive system: Secondary | ICD-10-CM | POA: Insufficient documentation

## 2024-11-10 DIAGNOSIS — H04123 Dry eye syndrome of bilateral lacrimal glands: Secondary | ICD-10-CM | POA: Insufficient documentation

## 2024-11-14 ENCOUNTER — Other Ambulatory Visit

## 2024-11-14 ENCOUNTER — Encounter

## 2024-11-14 DIAGNOSIS — R7689 Other specified abnormal immunological findings in serum: Secondary | ICD-10-CM

## 2024-11-14 DIAGNOSIS — Z79899 Other long term (current) drug therapy: Secondary | ICD-10-CM

## 2024-11-14 DIAGNOSIS — Z8379 Family history of other diseases of the digestive system: Secondary | ICD-10-CM

## 2024-11-14 DIAGNOSIS — I10 Essential (primary) hypertension: Secondary | ICD-10-CM | POA: Diagnosis not present

## 2024-11-14 DIAGNOSIS — H04123 Dry eye syndrome of bilateral lacrimal glands: Secondary | ICD-10-CM

## 2024-11-14 DIAGNOSIS — R7401 Elevation of levels of liver transaminase levels: Secondary | ICD-10-CM | POA: Diagnosis present

## 2024-11-14 LAB — BASIC METABOLIC PANEL WITH GFR
BUN: 26 mg/dL — ABNORMAL HIGH (ref 6–23)
CO2: 34 meq/L — ABNORMAL HIGH (ref 19–32)
Calcium: 9.7 mg/dL (ref 8.4–10.5)
Chloride: 96 meq/L (ref 96–112)
Creatinine, Ser: 0.55 mg/dL (ref 0.40–1.20)
GFR: 93 mL/min (ref >60.00)
Glucose, Bld: 95 mg/dL (ref 70–99)
Potassium: 3.4 meq/L — ABNORMAL LOW (ref 3.5–5.1)
Sodium: 136 meq/L (ref 135–145)

## 2024-11-14 LAB — CK: Total CK: 50 U/L (ref 17–177)

## 2024-11-14 LAB — HEPATIC FUNCTION PANEL
ALT: 41 U/L — ABNORMAL HIGH (ref 0–35)
AST: 28 U/L (ref 0–37)
Albumin: 3.8 g/dL (ref 3.5–5.2)
Alkaline Phosphatase: 54 U/L (ref 39–117)
Bilirubin, Direct: 0.1 mg/dL (ref 0.0–0.3)
Total Bilirubin: 0.8 mg/dL (ref 0.2–1.2)
Total Protein: 6.8 g/dL (ref 6.0–8.3)

## 2024-11-14 LAB — GAMMA GT: GGT: 21 U/L (ref 7–51)

## 2024-11-15 ENCOUNTER — Ambulatory Visit: Payer: Self-pay | Admitting: Internal Medicine

## 2024-11-15 NOTE — Progress Notes (Signed)
 Liver ultrasound is normal and no significant findings reassuring

## 2024-11-16 LAB — CELIAC AB TTG DGP TIGA
Deamidated Gliadin Abs, IgA: 12 U (ref 0–19)
Deamidated Gliadin Abs, IgG: 10 U (ref 0–19)
Immunoglobulin A, (IgA) QN, Serum: 573 mg/dL — ABNORMAL HIGH (ref 87–352)
t-Transglutaminase (tTG) IgA: <2 U/mL (ref 0–3)
t-Transglutaminase (tTG) IgG: 4 U/mL (ref 0–5)

## 2024-11-17 ENCOUNTER — Encounter (HOSPITAL_BASED_OUTPATIENT_CLINIC_OR_DEPARTMENT_OTHER): Payer: Self-pay

## 2024-11-17 ENCOUNTER — Ambulatory Visit (HOSPITAL_BASED_OUTPATIENT_CLINIC_OR_DEPARTMENT_OTHER)

## 2024-11-17 VITALS — BP 98/63 | HR 71 | Wt 184.4 lb

## 2024-11-17 DIAGNOSIS — R350 Frequency of micturition: Secondary | ICD-10-CM

## 2024-11-17 LAB — POCT URINALYSIS DIP (CLINITEK)
Bilirubin, UA: NEGATIVE
Blood, UA: NEGATIVE
Glucose, UA: NEGATIVE mg/dL
Ketones, POC UA: NEGATIVE mg/dL
Nitrite, UA: NEGATIVE
POC PROTEIN,UA: NEGATIVE
Spec Grav, UA: 1.015 (ref 1.010–1.025)
Urobilinogen, UA: 0.2 U/dL
pH, UA: 7 (ref 5.0–8.0)

## 2024-11-17 MED ORDER — NITROFURANTOIN MONOHYD MACRO 100 MG PO CAPS: 100.0000 mg | ORAL_CAPSULE | Freq: Two times a day (BID) | ORAL | 0 refills | Status: DC

## 2024-11-17 MED ORDER — PHENAZOPYRIDINE HCL 200 MG PO TABS: 200.0000 mg | ORAL_TABLET | Freq: Three times a day (TID) | ORAL | 1 refills | Status: DC | PRN

## 2024-11-17 NOTE — Progress Notes (Signed)
 NURSE VISIT- UTI SYMPTOMS   SUBJECTIVE:  Nichole Smith is a 68 y.o. [id number] female here for UTI symptoms. She is a GYN patient. She reports pelvic pressure, dysuria and urinary frequency for the last day or so.  OBJECTIVE:  LMP 02/12/2011   Appears well, in no apparent distress  ASSESSMENT: GYN patient with UTI symptoms and negative nitrites  PLAN: Visit routed to or discussed with:  Rx sent today: Yes Urine culture sent Call or return to clinic prn if these symptoms worsen or fail to improve as anticipated. Follow-up: as needed   Morna LOISE Quale, RN

## 2024-11-18 ENCOUNTER — Telehealth (HOSPITAL_BASED_OUTPATIENT_CLINIC_OR_DEPARTMENT_OTHER): Payer: Self-pay

## 2024-11-18 ENCOUNTER — Encounter (HOSPITAL_COMMUNITY): Payer: Self-pay

## 2024-11-18 NOTE — Telephone Encounter (Signed)
 CMN received for CPAP supplies sent to [facility] signed by provider and faxed confirmation received

## 2024-11-20 LAB — URINE CULTURE

## 2024-11-21 NOTE — Progress Notes (Signed)
 Still waiting on genetic screen for hemochromatosis    Iron level is too high and could be a  condition that effects liver. (And  the intervention is phlebotomy  to get the iron level down . ) Make sure not taking any iron.... or iron products    we may  paln to have you see hematology  and  GI team about this finding  Potassium slightly low again . I think you should go back on one potassium 20 me per day   again  one a day  and recheck  bmp in 1 months to check the potassium level .   I will send  info to dr Barbara and would like you to get appt with her or her team about the liver findings  ( but no  evidence of cirrhosis on US  screen )

## 2024-11-22 ENCOUNTER — Ambulatory Visit (HOSPITAL_COMMUNITY)
Admission: RE | Admit: 2024-11-22 | Discharge: 2024-11-22 | Disposition: A | Discharge status: Home / Self Care | Source: Ambulatory Visit | Attending: Cardiology | Admitting: Cardiology

## 2024-11-22 ENCOUNTER — Ambulatory Visit (HOSPITAL_BASED_OUTPATIENT_CLINIC_OR_DEPARTMENT_OTHER): Payer: Self-pay | Admitting: Obstetrics & Gynecology

## 2024-11-22 DIAGNOSIS — E785 Hyperlipidemia, unspecified: Secondary | ICD-10-CM

## 2024-11-22 DIAGNOSIS — I10 Essential (primary) hypertension: Secondary | ICD-10-CM

## 2024-11-22 DIAGNOSIS — I2510 Atherosclerotic heart disease of native coronary artery without angina pectoris: Secondary | ICD-10-CM | POA: Diagnosis present

## 2024-11-22 DIAGNOSIS — G4733 Obstructive sleep apnea (adult) (pediatric): Secondary | ICD-10-CM

## 2024-11-22 DIAGNOSIS — Z01818 Encounter for other preprocedural examination: Secondary | ICD-10-CM | POA: Diagnosis present

## 2024-11-22 LAB — NM PET CT CARDIAC PERFUSION MULTI W/ABSOLUTE BLOODFLOW
LV dias vol: 87 mL (ref 46–106)
MBFR: 2.63
Nuc Rest EF: 63 %
Nuc Stress EF: 72 %
Peak HR: 94 {beats}/min
Rest HR: 69 {beats}/min
Rest MBF: 1.02 ml/g/min
Rest Nuclear Isotope Dose: 21.6 mCi
ST Depression (mm): 0 mm
Stress MBF: 2.68 ml/g/min
Stress Nuclear Isotope Dose: 21.7 mCi
TID: 1.08

## 2024-11-22 MED ORDER — REGADENOSON 0.4 MG/5ML IV SOLN
INTRAVENOUS | Status: AC
  Filled 2024-11-22: qty 5

## 2024-11-22 MED ORDER — REGADENOSON 0.4 MG/5ML IV SOLN
0.4000 mg | Freq: Once | INTRAVENOUS | Status: AC
  Administered 2024-11-22: 0.4 mg via INTRAVENOUS

## 2024-11-22 MED ORDER — RUBIDIUM RB82 GENERATOR (RUBYFILL)
21.6000 | PACK | Freq: Once | INTRAVENOUS | Status: AC
  Administered 2024-11-22: 21.6 via INTRAVENOUS

## 2024-11-22 NOTE — Telephone Encounter (Signed)
 I didn't see that info   K please check on  status the lab hemochromatosis gene testing says released and in progress  ? Was this canceled? As per patient  info?

## 2024-11-22 NOTE — Progress Notes (Signed)
 Pt. Tolerated lexi scan well.

## 2024-11-25 ENCOUNTER — Other Ambulatory Visit

## 2024-11-25 ENCOUNTER — Encounter: Payer: Self-pay | Admitting: Gastroenterology

## 2024-11-25 ENCOUNTER — Ambulatory Visit: Admitting: Gastroenterology

## 2024-11-25 VITALS — BP 104/62 | HR 60 | Ht 59.0 in | Wt 185.5 lb

## 2024-11-25 DIAGNOSIS — R7689 Other specified abnormal immunological findings in serum: Secondary | ICD-10-CM

## 2024-11-25 DIAGNOSIS — R718 Other abnormality of red blood cells: Secondary | ICD-10-CM

## 2024-11-25 DIAGNOSIS — Z8379 Family history of other diseases of the digestive system: Secondary | ICD-10-CM

## 2024-11-25 DIAGNOSIS — R748 Abnormal levels of other serum enzymes: Secondary | ICD-10-CM

## 2024-11-25 DIAGNOSIS — R760 Raised antibody titer: Secondary | ICD-10-CM

## 2024-11-25 DIAGNOSIS — R899 Unspecified abnormal finding in specimens from other organs, systems and tissues: Secondary | ICD-10-CM

## 2024-11-25 DIAGNOSIS — D582 Other hemoglobinopathies: Secondary | ICD-10-CM

## 2024-11-25 LAB — IRON,TIBC AND FERRITIN PANEL
%SAT: 53 % — ABNORMAL HIGH (ref 16–45)
Ferritin: 106 ng/mL (ref 16–288)
Iron: 176 ug/dL — ABNORMAL HIGH (ref 45–160)
TIBC: 332 ug/dL (ref 250–450)

## 2024-11-25 LAB — ALDOLASE

## 2024-11-25 LAB — ALPHA-1-ANTITRYPSIN: A-1 Antitrypsin, Ser: 146 mg/dL (ref 83–199)

## 2024-11-25 LAB — HEMOCHROMATOSIS DNA-PCR(C282Y,H63D)

## 2024-11-25 LAB — SJOGRENS SYNDROME-A EXTRACTABLE NUCLEAR ANTIBODY: SSA (Ro) (ENA) Antibody, IgG: <1 AI

## 2024-11-25 LAB — CERULOPLASMIN: Ceruloplasmin: 25 mg/dL (ref 14–48)

## 2024-11-25 NOTE — Patient Instructions (Signed)
 Please go to the lab in the basement of our building to have lab work done as you leave today. Hit B for basement when you get on the elevator.  When the doors open the lab is on your left.  We will call you with the results. Thank you.  _______________________________________________________  If your blood pressure at your visit was 140/90 or greater, please contact your primary care physician to follow up on this.  _______________________________________________________  If you are age 55 or older, your body mass index should be between 23-30. Your Body mass index is 37.47 kg/m. If this is out of the aforementioned range listed, please consider follow up with your Primary Care Provider.  If you are age 50 or younger, your body mass index should be between 19-25. Your Body mass index is 37.47 kg/m. If this is out of the aformentioned range listed, please consider follow up with your Primary Care Provider.   ________________________________________________________  The [facility] providers would like to encourage you to use MYCHART to communicate with providers for non-urgent requests or questions.  Due to long hold times on the telephone, sending your provider a message by [facility] may be a faster and more efficient way to get a response.  Please allow 48 business hours for a response.  Please remember that this is for non-urgent requests.  _______________________________________________________  [facility] is using a team-based approach to care.  Your team is made up of your doctor and two to three APPS. Our APPS (Nurse Practitioners and Physician Assistants) work with your physician to ensure care continuity for you. They are fully qualified to address your health concerns and develop a treatment plan. They communicate directly with your gastroenterologist to care for you. Seeing the Advanced Practice Practitioners on your physician's team can help you by facilitating care more  promptly, often allowing for earlier appointments, access to diagnostic testing, procedures, and other specialty referrals.    Due to recent changes in healthcare laws, you may see the results of your imaging and laboratory studies on MyChart before your provider has had a chance to review them.  We understand that in some cases there may be results that are confusing or concerning to you. Not all laboratory results come back in the same time frame and the provider may be waiting for multiple results in order to interpret others.  Please give us  48 hours in order for your provider to thoroughly review all the results before contacting the office for clarification of your results.

## 2024-11-25 NOTE — Progress Notes (Signed)
 "  Nichole Smith [id number] [birth year]   Chief Complaint: Abnormal labs  Referring Provider: Charlett Apolinar POUR, MD Primary GI MD: Dr. Shila  HPI: Nichole Smith is a 68 y.o. female with past medical history of arthritis, carcinoid tumor of lung, HTN, hypothyroidism, OSA, PCOS, hysterectomy, A. Fib on Xarelto  who presents today for abnormal labs.    Patient is referred for evaluation of abnormal lab findings.  On labs done with PCP was found to have a positive [name].  Referred to rheumatology.  Liver panel normal (did have mild elevation in ALT to 51 previously) but does have family history of cirrhosis of liver ultrasound and other labs were ordered.  [name] with low titer, negative hep C, normal GGT, most recent hepatic function panel with mild elevation in ALT to 41 and otherwise normal, normal CK, normal A1A, negative celiac panel, iron 176, elevated percent saturation 53, ferritin 106, normal ceruloplasmin.  She has had an elevated hemoglobin and hematocrit.  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  Consistent with HH and individual with clinical evidence of HH, does not predict diagnosis of HH in an asymptomatic individual.  She had a normal RUQ ultrasound.   Discussed the use of AI scribe software for clinical note transcription with the patient, who gave verbal consent to proceed.  History of Present Illness Nichole Smith is a 68 year old female with abnormal liver and iron studies who presents for evaluation of possible hereditary hemochromatosis.  Abnormal liver and iron studies - Longstanding elevated hemoglobin and abnormal iron studies have persisted for many years. - ALT has fluctuated between normal and mildly elevated. - Recent laboratory workup revealed a positive antinuclear antibody ([name]). - Genetic testing identified a less common variant associated with  hereditary hemochromatosis. - Liver ultrasound was normal with no evidence of cirrhosis. - No symptoms attributable to liver disease. - No digestive symptoms, changes in bowel movements, or abdominal pain. - Digestion is normal and she remains asymptomatic from a hepatic standpoint.  Family history of liver disease - Father and his identical twin had cirrhosis, both with significant alcohol use. - Patient denies alcohol consumption.  Pulmonary history - History of resected lung tumor approximately 10-12 years ago, discovered during hematology workup for elevated hemoglobin (and says she never followed up). - Uses CPAP. - Overnight oxygen studies have been normal.  Anticoagulation therapy - Currently on Xarelto  - High coronary calcium  score despite low cholesterol. - Recent stress test was normal. - Interested in discontinuing blood thinners if possible.  Musculoskeletal symptoms - Chronic back pain at the left waist level and knee pain are present. - Able to walk 30-40 minutes daily without significant limitation.  Autoimmune evaluation - Previous evaluation suggested possible Sjogren's syndrome, but Sjogren's antibody was negative. - Rheumatology evaluation is scheduled next year.  Cardiopulmonary symptoms - No chest pain or shortness of breath.    Previous GI Procedures/Imaging   RUQ US  11/10/2024 IMPRESSION: No sonographic etiology for elevated LFTs identified.   Colonoscopy 11/12/2023 - Two 3 to 10 mm polyps in the ascending colon, removed with a cold snare. Resected and retrieved.  - Diverticulosis in the sigmoid colon.  - Non- bleeding external and internal hemorrhoids. - Recall 3 years Path: 1. Surgical [P], colon, ascending polyp x 2, polyp (2) :       -  SESSILE SERRATED LESION, NEGATIVE FOR DYSPLASIA.    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  Download 08/2014:  Good compliance, breakthru apnea on 10cm.  Changed to auto 5-12cm >> good control of AHI on f/u download ONO on CPAP:       Osteoarthritis    PCOS (polycystic ovarian syndrome)    PMB (postmenopausal bleeding)    SUI (stress urinary incontinence, female)    h/o   Wears glasses     Past Surgical History:  Procedure Laterality Date   ANTERIOR CERVICAL DECOMP/DISCECTOMY FUSION  12/24/2017   Procedure: Cervical five-six Cervical six-seven Anterior cervical decompression/discectomy/fusion;  Surgeon: Unice Pac, MD;  Location: [facility] OR;  Service: Neurosurgery;;   CARPAL TUNNEL RELEASE Left 12/24/2017   Procedure: LEFT CARPAL TUNNEL RELEASE;  Surgeon: Unice Pac, MD;  Location: [facility] OR;  Service: Neurosurgery;  Laterality: Left;   CATARACT EXTRACTION Bilateral    Right eye 08/24/2023   CESAREAN SECTION     x 1 - twins   COLONOSCOPY  2021   DILATATION & CURRETTAGE/HYSTEROSCOPY WITH RESECTOCOPE N/A 07/18/2013   Procedure: DILATATION & CURETTAGE/HYSTEROSCOPY WITH RESECTOCOPE;  Surgeon: Ronal Elvie Pinal, MD;  Location: [facility];  Service: Gynecology;  Laterality: N/A;  mass resection   HYSTEROSCOPY WITH D & C N/A 05/05/2023   Procedure: DILATATION AND CURETTAGE /HYSTEROSCOPY;  Surgeon: Pinal Ronal RAMAN, MD;  Location: [facility];  Service: Gynecology;  Laterality: N/A;   implantable loop recorder placement  05/20/2021   Medtronic Reveal Linq model [id number] implantable loop recorder (SN [id number])   Lung tumor removed  2012   Rt lung carcinoid   MOUTH SURGERY     pre-cancerous ulcer  removed   POLYPECTOMY     SHOULDER OPEN ROTATOR CUFF REPAIR  07/20/2010   Right   WISDOM TOOTH EXTRACTION      Current Outpatient Medications  Medication Sig Dispense Refill   alendronate  (FOSAMAX ) 70 MG tablet Take 1 tablet (70 mg total) by mouth every 7 (seven) days. Take first thing in am with 6 oz. Water.  Be upright after taking.  Eat nothing for one hour. 13 tablet 3   atorvastatin  (LIPITOR) 40 MG tablet Take 1 tablet (40 mg total) by mouth daily. 90 tablet 3   CALCIUM  PO Take 1 tablet by mouth at bedtime.      chlorthalidone  (HYGROTON ) 25 MG tablet TAKE 1 TABLET DAILY 90 tablet 3   diltiazem  (CARDIZEM  CD) 180 MG 24 hr capsule Take 1 capsule (180 mg total) by mouth daily. 90 each 3   diltiazem  (CARDIZEM ) 30 MG tablet 1 tablet every 6 hours for palpitations 60 tablet 3   Doxepin  HCl 3 MG TABS Take 1 tablet (3 mg total) by mouth at bedtime as needed. 30 tablet 2   estradiol  (ESTRACE ) 0.1 MG/GM vaginal cream INSERT 1 GM VAGINALLY TWICE WEEKLY 42.5 g 3   GEMTESA  75 MG TABS Take 1 tablet (75 mg total) by mouth daily as needed. 90 tablet 3  metoprolol  succinate (TOPROL -XL) 50 MG 24 hr tablet Take 1 tablet (50 mg total) by mouth daily. 90 tablet 2   Multiple Vitamins-Minerals (PRESERVISION AREDS 2 PO) Take 1 tablet by mouth 2 (two) times daily.     nitrofurantoin , macrocrystal-monohydrate, (MACROBID ) 100 MG capsule Take 1 capsule (100 mg total) by mouth 2 (two) times daily. 14 capsule 0   phenazopyridine  (PYRIDIUM ) 200 MG tablet Take 1 tablet (200 mg total) by mouth 3 (three) times daily as needed for pain. 10 tablet 0   phenazopyridine  (PYRIDIUM ) 200 MG tablet Take 1 tablet (200 mg total) by mouth 3 (three) times daily as needed for pain (urethral spasm). 10 tablet 1   potassium chloride  SA (KLOR-CON  M) 20 MEQ tablet Take 1 tablet (20 mEq total) by mouth daily. 90 tablet 1   Probiotic Product (PROBIOTIC PO) Take by mouth.     SYNTHROID  125 MCG tablet Take 1 tablet (125 mcg total) by mouth  daily before breakfast. Dosage change 90 tablet 3   XARELTO  20 MG TABS tablet TAKE 1 TABLET DAILY WITH SUPPER 90 tablet 3   Suzetrigine  (JOURNAVX ) 50 MG TABS Take 50 mg by mouth 2 (two) times daily. (Patient not taking: Reported on 11/25/2024) 180 tablet 0   tirzepatide  (MOUNJARO ) 15 MG/0.5ML Pen Inject 15 mg into the skin once a week. (Patient not taking: Reported on 11/25/2024) 2 mL 2   No current facility-administered medications for this visit.    Allergies as of 11/25/2024 - Review Complete 11/25/2024  Allergen Reaction Noted   Neomycin Other (See Comments)    Ace inhibitors Cough 02/04/2013    Family History  Problem Relation Age of Onset   Stroke Mother        died age 64   Hypertension Mother    Sudden death Mother    Alcoholism Mother    Coronary artery disease Father        died age 53   Hypertension Father    Liver disease Father    Alcohol abuse Father    Obesity Father    Parkinson's disease Brother    Diabetes Brother    Hypertension Brother    Hypertension Maternal Uncle    Colon cancer Neg Hx    Rectal cancer Neg Hx    Stomach cancer Neg Hx    Colon polyps Neg Hx    Esophageal cancer Neg Hx     Social History[1]   Review of Systems:    Constitutional: No weight loss, fever, chills Cardiovascular: No chest pain Respiratory: No SOB  Gastrointestinal: See HPI and otherwise negative   Physical Exam:  Vital signs: BP 104/62 (BP Location: Right Arm, Patient Position: Sitting, Cuff Size: Large)   Pulse 60   Ht 4' 11 (1.499 m)   Wt 185 lb 8 oz (84.1 kg)   LMP 02/12/2011   BMI 37.47 kg/m   Wt Readings from Last 3 Encounters:  11/25/24 185 lb 8 oz (84.1 kg)  11/17/24 184 lb 6.4 oz (83.6 kg)  11/02/24 184 lb 3.2 oz (83.6 kg)    Constitutional: Pleasant, obese female in NAD, alert and cooperative Head:  Normocephalic and atraumatic.  Respiratory: Respirations even and unlabored. Lungs clear to auscultation bilaterally.  No wheezes, crackles, or  rhonchi.  Cardiovascular:  Regular rate and rhythm. No murmurs. No peripheral edema. Gastrointestinal:  Soft, nondistended, nontender. No rebound or guarding. Normal bowel sounds. No appreciable masses or hepatomegaly. Rectal:  Not performed.  Neurologic:  Alert and oriented x4;  grossly normal neurologically.  Skin:   Dry and intact without significant lesions or rashes. Psychiatric: Oriented to person, place and time. Demonstrates good judgement and reason without abnormal affect or behaviors.   Echocardiogram 12/13/2018 - Left ventricle: The cavity size was normal. Wall thickness was    normal. Systolic function was normal. The estimated ejection    fraction was in the range of 60% to 65%. Wall motion was normal;    there were no regional wall motion abnormalities. Doppler    parameters are consistent with abnormal left ventricular    relaxation (grade 1 diastolic dysfunction).   Assessment/Plan:   Assessment & Plan Possible hereditary hemochromatosis Elevated liver enzymes Positive [name] Family history of cirrhosis Abnormal laboratory findings Elevated hemoglobin and hematocrit Patient referred for abnormal lab findings.  Recently found to have a positive [name].  Has had mild elevation in ALT intermittently.  Family history of cirrhosis in her father and uncle who she states both had a history of alcohol abuse.  Patient denies any personal alcohol consumption. She has had a negative hep C, normal GGT, most recent ALT of 41 and otherwise normal hepatic function panel, normal CK, normal A1A, negative celiac panel.  Iron studies have been abnormal with iron 176, elevated saturation 53%, ferritin 106, normal ceruloplasmin.  Has also had chronically elevated hemoglobin and hematocrit which she states was previously evaluated years ago, at which time she was incidentally found to have a lung tumor and did not follow-up with hematology after that.  Hemochromatosis DNA was positive for 2 copies  of the HFE gene pathogenic variant H63D/H63D (HOMOZYGOTE), negative for C282Y pathogenic variant.  Consistent with HH and individual with clinical evidence of HH, does not predict diagnosis of HH in an asymptomatic individual.  Has not yet been referred to hematology for consideration of phlebotomy.  Recent RUQ ultrasound was normal with no explanation for elevated LFTs identified.  Fib 4 score of 1.08.  No fatty liver identified on ultrasound. Will order some additional labs to rule out other causes of liver disease.  Discussed ordering MRI of the liver to evaluate for iron deposition, with consideration for liver biopsy in future per findings.  This was initially ordered, but on scheduling there was concern regarding patient having an implant.  Hold off on scheduling MRI today and we will revisit.  Discuss further with Dr. Shila.  - Labs: PT/INR, TSH, HepBsAg, HepBsAb, HepBcAb, HAV, IgG, ASMA, AMA, TTG IgA, IgA   - Consider MRI liver (will likely proceed with this, follow up next week to discuss with radiology who may call patient directly) - Referred to hematology for further evaluation and management, including consideration of phlebotomy if indicated. - Deferred liver biopsy unless MRI or clinical course indicates need for tissue diagnosis. - Will discuss further with Dr. Shila Camie Furbish, PA-C [facility] 11/25/2024, 10:23 AM  Patient Care Team: Charlett Apolinar POUR, MD as PCP - General (Internal Medicine) [name], MD as PCP - Cardiology (Cardiology) Inocencio Soyla Lunger, MD as PCP - Electrophysiology (Cardiology) Robinson Pao, MD (Dermatology) Cleotilde Ronal RAMAN, MD as Attending Physician (Gynecology) Shari Sieving, MD as Attending Physician (Orthopedic Surgery) Jude Harden GAILS, MD as Consulting Physician (Pulmonary Disease)       [1]  Social History Tobacco Use   Smoking status: Never   Smokeless tobacco: Never   Tobacco comments:    Never smoked  08/07/23  Vaping Use   Vaping status: Never Used  Substance Use Topics  Alcohol use: Not Currently    Comment: occ   Drug use: No   "

## 2024-11-25 NOTE — Telephone Encounter (Signed)
" °  ° °  Primary Cardiologist: [name] Bihari, MD  Chart reviewed as part of pre-operative protocol coverage. Given past medical history and time since last visit, based on ACC/AHA guidelines, MYLEA ROARTY would be at acceptable risk for the planned procedure without further cardiovascular testing.   Reassuring cardiac PET CT on 11/22/2024.  Patient with diagnosis of afib on Xarelto  for anticoagulation.     Procedure:  total vaginal hysterectomy with bilateral salpingo-oophorectomy, uterosacral ligament suspension, anterior and posterior repair with perineorrhaphy, midurethral sling, cystoscopy   Date of procedure: TBD     CHA2DS2-VASc Score = 4   This indicates a 4.8% annual risk of stroke. The patient's score is based upon: CHF History: 0 HTN History: 1 Diabetes History: 0 Stroke History: 0 Vascular Disease History: 1 Age Score: 1 Gender Score: 1       CrCl 87 ml/min Platelet count 283   Patient has not had an Afib/aflutter ablation in the last 3 months, DCCV within the last 4 weeks or a watchman implanted in the last 45 days    Per office protocol, patient can hold Xarelto  for 2 days prior to procedure.   I will route this recommendation to the requesting party via Epic fax function and remove from pre-op pool.  Please call with questions.  Josefa HERO. Roye Gustafson NP-C     11/25/2024, 11:20 AM [facility] [address] [place], [place] [address] Office [phone]      "

## 2024-11-26 ENCOUNTER — Ambulatory Visit: Payer: Self-pay | Admitting: Cardiology

## 2024-11-27 ENCOUNTER — Other Ambulatory Visit: Payer: Self-pay | Admitting: Physician Assistant

## 2024-11-27 NOTE — Progress Notes (Signed)
 I see that you have genetic predisposition to [facility] and that you did get to see the GI team and further evaluation planned .  Including getting with hematology.   I will let the gi/rheum  and heme team  follow up  of  final/working  dx and interventions.  And you can follow up with me  when coordination is needed and after all consults . Sorry you have to see so many specialists  but  there is such overlap in medical issues .   Reassuring that the liver imaging so far shows no abnormalities  or fibrosis markers.   Happy new year .SABRA

## 2024-11-28 ENCOUNTER — Telehealth: Payer: Self-pay | Admitting: Gastroenterology

## 2024-11-28 ENCOUNTER — Ambulatory Visit: Payer: Self-pay | Admitting: Gastroenterology

## 2024-11-28 ENCOUNTER — Encounter

## 2024-11-28 DIAGNOSIS — Z8379 Family history of other diseases of the digestive system: Secondary | ICD-10-CM

## 2024-11-28 DIAGNOSIS — R748 Abnormal levels of other serum enzymes: Secondary | ICD-10-CM

## 2024-11-28 LAB — ANTI-SMOOTH MUSCLE ANTIBODY, IGG: Actin (Smooth Muscle) Antibody (IGG): <20 U

## 2024-11-28 LAB — IGA: Immunoglobulin A: 590 mg/dL — ABNORMAL HIGH (ref 70–320)

## 2024-11-28 LAB — HEPATITIS A ANTIBODY, TOTAL: Hepatitis A AB,Total: NONREACTIVE

## 2024-11-28 LAB — MITOCHONDRIAL ANTIBODIES: Mitochondrial M2 Ab, IgG: <=20 U

## 2024-11-28 LAB — HEPATITIS B SURFACE ANTIGEN: Hepatitis B Surface Ag: NONREACTIVE

## 2024-11-28 LAB — HEPATITIS B SURFACE ANTIBODY,QUALITATIVE: Hep B S Ab: NONREACTIVE

## 2024-11-28 LAB — PROTIME-INR
INR: 2.8 ratio — ABNORMAL HIGH (ref 0.8–1.0)
Prothrombin Time: 28.2 s — ABNORMAL HIGH (ref 9.6–13.1)

## 2024-11-28 LAB — IGG: IgG (Immunoglobin G), Serum: 984 mg/dL (ref 600–1540)

## 2024-11-28 NOTE — Telephone Encounter (Signed)
 Patient was recently seen for abnormal labs and concern for hemochromatosis.  We had planned on getting additional labs done as well as ordering MRI of the liver to evaluate for iron overload.  Initially there was some concern regarding patient's presence of an implant so MRI order was canceled..  Looks like she may have a pacemaker.  Please place order for MRI of the liver, and patient may just need to discuss with radiology team regarding whether her specific pacemaker is MRI conditional.

## 2024-11-28 NOTE — Telephone Encounter (Signed)
 I spoke with Nichole Smith and informed her of the MRI appointment. Arrive at 9:00AM for a 9:30AM scan.  NPO 4 hours. To be done at [facility].

## 2024-11-28 NOTE — Telephone Encounter (Signed)
 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 Did you want the MRI with or without contrast or both. Thanks. I will set it up and call her back.

## 2024-11-29 ENCOUNTER — Ambulatory Visit

## 2024-11-29 DIAGNOSIS — I480 Paroxysmal atrial fibrillation: Secondary | ICD-10-CM

## 2024-11-30 LAB — CUP PACEART REMOTE DEVICE CHECK
Date Time Interrogation Session: 20251229230659
Implantable Pulse Generator Implant Date: 20220620

## 2024-11-30 NOTE — Progress Notes (Signed)
 Remote Loop Recorder Transmission

## 2024-12-03 ENCOUNTER — Ambulatory Visit: Payer: Self-pay | Admitting: Cardiology

## 2024-12-05 ENCOUNTER — Telehealth (INDEPENDENT_AMBULATORY_CARE_PROVIDER_SITE_OTHER): Payer: Self-pay | Admitting: Physician Assistant

## 2024-12-05 ENCOUNTER — Telehealth: Payer: Self-pay

## 2024-12-05 ENCOUNTER — Ambulatory Visit (INDEPENDENT_AMBULATORY_CARE_PROVIDER_SITE_OTHER): Admitting: Physician Assistant

## 2024-12-05 ENCOUNTER — Ambulatory Visit (HOSPITAL_COMMUNITY)
Admission: RE | Admit: 2024-12-05 | Discharge: 2024-12-05 | Disposition: A | Discharge status: Home / Self Care | Source: Ambulatory Visit | Attending: Gastroenterology | Admitting: Gastroenterology

## 2024-12-05 ENCOUNTER — Other Ambulatory Visit: Payer: Self-pay

## 2024-12-05 DIAGNOSIS — R748 Abnormal levels of other serum enzymes: Secondary | ICD-10-CM | POA: Diagnosis present

## 2024-12-05 DIAGNOSIS — Z8379 Family history of other diseases of the digestive system: Secondary | ICD-10-CM | POA: Diagnosis present

## 2024-12-05 MED ORDER — POTASSIUM CHLORIDE CRYS ER 20 MEQ PO TBCR: 20.0000 meq | EXTENDED_RELEASE_TABLET | Freq: Every day | ORAL | 1 refills | Status: AC

## 2024-12-05 MED ORDER — GADOBUTROL 1 MMOL/ML IV SOLN
8.0000 mL | Freq: Once | INTRAVENOUS | Status: AC | PRN
  Administered 2024-12-05: 8 mL via INTRAVENOUS

## 2024-12-05 NOTE — Telephone Encounter (Signed)
 FYI lab from 11/14/2024 below was not perform:  Component Ref Range & Units (hover) 3 wk ago  Aldolase CANCELED  Comment: TEST NOT PERFORMED. SABRA Specimen received at room temperature.  Result canceled by the ancillary.    [facility] send on 11/17/2024.

## 2024-12-05 NOTE — Telephone Encounter (Signed)
 Contacted pt to confirm on a request from [facility] for Potassium Rx. Pt states [facility] is her mailing pharmacy and request for the Rx to be sent through them.   Rx sent.

## 2024-12-05 NOTE — Telephone Encounter (Signed)
 Good morning!  Patient was scheduled to see Shawn this morning and was rescheduled. She needs a refill of her zepbound . If this isn't possible, please send her a fpl group.  Thanks!

## 2024-12-06 ENCOUNTER — Ambulatory Visit: Payer: Self-pay | Admitting: Gastroenterology

## 2024-12-07 ENCOUNTER — Encounter: Payer: Self-pay | Admitting: Internal Medicine

## 2024-12-07 ENCOUNTER — Encounter (HOSPITAL_BASED_OUTPATIENT_CLINIC_OR_DEPARTMENT_OTHER): Payer: Self-pay | Admitting: Obstetrics & Gynecology

## 2024-12-07 ENCOUNTER — Telehealth: Payer: Self-pay | Admitting: Gastroenterology

## 2024-12-07 DIAGNOSIS — M2550 Pain in unspecified joint: Secondary | ICD-10-CM

## 2024-12-07 DIAGNOSIS — D582 Other hemoglobinopathies: Secondary | ICD-10-CM

## 2024-12-07 NOTE — Telephone Encounter (Signed)
 The patient has been notified of this information and all questions answered.   The pt is happy with this plan

## 2024-12-07 NOTE — Telephone Encounter (Signed)
 Please let patient know that I discussed with Dr. Shila.  Liver biopsy is not indicated at this time.  She recommends keeping appointment with hematology and following with them for further monitoring and treatment.

## 2024-12-08 ENCOUNTER — Other Ambulatory Visit (INDEPENDENT_AMBULATORY_CARE_PROVIDER_SITE_OTHER): Payer: Self-pay | Admitting: Physician Assistant

## 2024-12-08 DIAGNOSIS — I2510 Atherosclerotic heart disease of native coronary artery without angina pectoris: Secondary | ICD-10-CM

## 2024-12-08 DIAGNOSIS — R7401 Elevation of levels of liver transaminase levels: Secondary | ICD-10-CM

## 2024-12-08 DIAGNOSIS — G4733 Obstructive sleep apnea (adult) (pediatric): Secondary | ICD-10-CM

## 2024-12-08 MED ORDER — ZEPBOUND 15 MG/0.5ML ~~LOC~~ SOLN: 15.0000 mg | SUBCUTANEOUS | 2 refills | Status: AC

## 2024-12-08 NOTE — Progress Notes (Signed)
 Rx for Zepbound  15 mg weekly sent to [facility] as requested.  Tyshay Adee,PA-C

## 2024-12-09 ENCOUNTER — Telehealth: Payer: Self-pay | Admitting: Pulmonary Disease

## 2024-12-09 NOTE — Telephone Encounter (Signed)
 Fax received from Dr. Rosaline Caper with [facility] to perform a vaginal surgery for her pelvic organ prolapse on patient. Surgery will last approx 3 hours and most likely to be done under general anesthesia Patient needs surgery clearance. Surgery is pending. Patient was seen on 08/04/24. Office protocol is a risk assessment can be sent to surgeon if patient has been seen in 60 days or less.   Pt needs appt for risk assessment   I called and scheduled her with Candis for Monday 12/12/24   Routing to clearance pool until the visit is complete

## 2024-12-10 NOTE — Telephone Encounter (Signed)
 Thanks for the update . Please see if would be able to get a sooner appt at the  [facility] for  hemachromatosis gene and elevated hg and joint pains Otherwise   keep appt.

## 2024-12-12 ENCOUNTER — Encounter (HOSPITAL_BASED_OUTPATIENT_CLINIC_OR_DEPARTMENT_OTHER): Payer: Self-pay

## 2024-12-12 ENCOUNTER — Encounter: Payer: Self-pay | Admitting: *Deleted

## 2024-12-12 ENCOUNTER — Ambulatory Visit (INDEPENDENT_AMBULATORY_CARE_PROVIDER_SITE_OTHER)

## 2024-12-12 ENCOUNTER — Telehealth: Payer: Self-pay | Admitting: *Deleted

## 2024-12-12 VITALS — BP 120/74 | HR 69 | Ht 59.0 in | Wt 187.0 lb

## 2024-12-12 DIAGNOSIS — Z01811 Encounter for preprocedural respiratory examination: Secondary | ICD-10-CM

## 2024-12-12 DIAGNOSIS — G4733 Obstructive sleep apnea (adult) (pediatric): Secondary | ICD-10-CM | POA: Diagnosis not present

## 2024-12-12 DIAGNOSIS — I480 Paroxysmal atrial fibrillation: Secondary | ICD-10-CM

## 2024-12-12 NOTE — Progress Notes (Signed)
 "  @Patient  ID: Nichole Smith, female    DOB: 08-13-1956, 69 y.o.   MRN: [id number]  Chief Complaint  Patient presents with   surgical clearance    Referring provider: Panosh, Wanda K, MD  HPI: Discussed the use of AI scribe software for clinical note transcription with the patient, who gave verbal consent to proceed.  History of Present Illness Nichole Smith is a 69 year old female who presents for preoperative evaluation for a hysterectomy. She was referred by her surgical team for preoperative evaluation and risk stratification in the setting of known OSA on CPAP therapy.  She is scheduled for a vaginal hysterectomy with bladder sling procedure. She has undergone a nuclear stress test recently and reports that she passed.  She reports high iron levels and low potassium, for which she has undergone extensive blood work. She has not yet seen a hematologist but has completed evaluations with gastroenterology, including liver function tests. No recent lung infections or anemia.  Compliance download reviewed and demonstrates excellent compliance with 100% usage over the last 90 days.  Residual AHI 0.5/hr and acceptable leak profile.  Usage on average just under 8 hours a night.  No recent respiratory infections and reports an oxygen saturation of 98% today.  Last OV 08/04/2024: 69  y.o. female with obstructive sleep apnea.  -on CPAP 14 cm  PMH : Leukocytoclastic vasculitis, Hypothyroidism, HTN, Coronary calcification, Carcinoid tumor of lung 2012  Nichole Smith is a 69 year old female with sleep apnea who presents for a regular follow-up to manage her CPAP therapy.   She has been using CPAP therapy for several years and recently switched to a nasal mask, the N30, which she finds more comfortable and suitable for her sleeping position. The mask's smaller size and top-positioned hose allow her to sleep on her stomach without issues.   She has experienced significant weight loss  of almost sixty pounds over the last five years due to lifestyle changes and medication. Her basic metabolic rate is 8499, and she consumes about 1200 calories to continue losing weight.   She experiences sleep maintenance insomnia and uses doxepin  occasionally, especially when traveling or anticipating a busy day. It helps her return to sleep after waking up during the night. She wakes up to use the bathroom but returns to sleep quickly with the medication. Melatonin was ineffective for her.   She experiences dry mouth and dry eyes, which she attributes to medication use. Despite these symptoms, she feels her overall health has improved.     Significant tests/ events reviewed   PSG 07/10/14 >> AHI 85 ONO with CPAP 04/12/18 >> test time 6 hrs 34 min.  Average SpO2 90%, low SpO2 82%.  Spent 43.5 min with SpO2 < 88%. ONO with CPAP 06/08/18 >> test time 8 hrs 42 min.  Basal SpO2 93.6%, low SpO2 81%.  Spent 4.3 min with SpO2 < 88%. ONO with CPAP 06/02/23 >> test time 7 hrs 43 min. Baseline SpO2 92%, low SpO2 85%. Spent 8 min 38 sec with SpO2 < 88%.  2/20221 CT cors  -old granulomatous dz    TEST/EVENTS :   Allergies[1]  Immunization History  Administered Date(s) Administered    sv, Bivalent, Protein Subunit Rsvpref,pf Marlow) 10/17/2022   Fluad Quad(high Dose 65+) 09/21/2019, 09/10/2020, 09/10/2022   Fluad Trivalent(High Dose 65+) 08/24/2023   INFLUENZA, HIGH DOSE SEASONAL PF 09/02/2024   Influenza Split 10/17/2011, 08/12/2012, 08/01/2013   Influenza, Quadrivalent, Recombinant, Inj, Pf  08/12/2018   Influenza,inj,Quad PF,6+ Mos 09/04/2014, 08/27/2015, 08/01/2016, 08/10/2017   Influenza-Unspecified 08/30/2018, 08/31/2021   Moderna Covid-19 Fall Seasonal Vaccine 46yrs & older 09/18/2023   Moderna Covid-19 Vaccine Bivalent Booster 63yrs & up 08/14/2021, 04/01/2022   Moderna SARS-COV2 Booster Vaccination 08/31/2021, 03/31/2022   Moderna Sars-Covid-2 Vaccination 12/30/2019, 01/27/2020,  09/22/2020, 12/15/2020, 03/12/2021, 03/15/2021, 09/10/2022   Pfizer(Comirnaty)Fall Seasonal Vaccine 12 years and older 04/12/2024, 10/18/2024   Pneumococcal Conjugate-13 10/05/2019   Pneumococcal Polysaccharide-23 09/10/2020   Td 12/01/1997, 10/16/2008   Tdap 01/30/2014   Zoster Recombinant(Shingrix) 01/12/2017, 04/02/2017   Zoster, Live 05/25/2012    Past Medical History:  Diagnosis Date   Arthritis    knees, back   Bursitis    hips - almost gone per pt    Carcinoid tumor of lung (HCC) 2012   right found incidentally on chest xray eval for r/o vasxculitis    Coronary artery calcification 07/23/2016   Dysrhythmia    irregular occassional, Nichole  no current problem   Frequent UTI    Hypertension    Hypothyroidism    Infertility, female    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  Download 08/2014:  Good compliance, breakthru apnea on 10cm.  Changed to auto 5-12cm >> good control of AHI on f/u download ONO on CPAP:       Osteoarthritis    PCOS (polycystic ovarian syndrome)    PMB (postmenopausal bleeding)    SUI (stress urinary incontinence, female)    h/o   Wears glasses     Tobacco History: Tobacco Use History[2] Counseling given: Not Answered Tobacco comments: Never smoked 08/07/23   Outpatient Medications Prior to Visit  Medication Sig Dispense Refill   alendronate  (FOSAMAX ) 70 MG tablet Take 1 tablet (70 mg total) by mouth every 7 (seven) days. Take first thing in am with 6 oz. Water.  Be upright after taking.  Eat nothing for one hour. 13 tablet 3   atorvastatin  (LIPITOR) 40 MG tablet Take 1 tablet (40 mg total) by mouth daily. 90 tablet 3   CALCIUM  PO Take 1 tablet by mouth at bedtime.      chlorthalidone  (HYGROTON ) 25 MG tablet Take 1 tablet (25 mg total) by mouth daily. 90 tablet 3   diltiazem  (CARDIZEM  CD) 180 MG 24 hr capsule Take 1 capsule (180 mg total) by mouth daily. 90  each 3   diltiazem  (CARDIZEM ) 30 MG tablet 1 tablet every 6 hours for palpitations 60 tablet 3   Doxepin  HCl 3 MG TABS Take 1 tablet (3 mg total) by mouth at bedtime as needed. 30 tablet 2   estradiol  (ESTRACE ) 0.1 MG/GM vaginal cream INSERT 1 GM VAGINALLY TWICE WEEKLY 42.5 g 3   GEMTESA  75 MG TABS Take 1 tablet (75 mg total) by mouth daily as needed. 90 tablet 3   metoprolol  succinate (TOPROL -XL) 50 MG 24 hr tablet Take 1 tablet (50 mg total) by mouth daily. 90 tablet 2   Multiple Vitamins-Minerals (PRESERVISION AREDS 2 PO) Take 1 tablet by mouth 2 (two) times daily.     nitrofurantoin , macrocrystal-monohydrate, (MACROBID ) 100 MG capsule Take 1 capsule (100 mg total) by mouth 2 (two) times daily. 14 capsule 0   phenazopyridine  (PYRIDIUM ) 200 MG tablet Take 1 tablet (200 mg total) by mouth 3 (three) times daily as needed for pain. 10 tablet 0   phenazopyridine  (PYRIDIUM ) 200 MG tablet Take 1 tablet (200 mg total) by mouth  3 (three) times daily as needed for pain (urethral spasm). 10 tablet 1   potassium chloride  SA (KLOR-CON  M) 20 MEQ tablet Take 1 tablet (20 mEq total) by mouth daily. 90 tablet 1   Probiotic Product (PROBIOTIC PO) Take by mouth.     Suzetrigine  (JOURNAVX ) 50 MG TABS Take 50 mg by mouth 2 (two) times daily. 180 tablet 0   SYNTHROID  125 MCG tablet Take 1 tablet (125 mcg total) by mouth daily before breakfast. Dosage change 90 tablet 3   Tirzepatide -Weight Management (ZEPBOUND ) 15 MG/0.5ML SOLN Inject 15 mg into the skin once a week. 2 mL 2   XARELTO  20 MG TABS tablet TAKE 1 TABLET DAILY WITH SUPPER 90 tablet 3   No facility-administered medications prior to visit.     Review of Systems: as per hpi  Constitutional:   No  weight loss, night sweats,  Fevers, chills, fatigue, or  lassitude.  HEENT:   No headaches,  Difficulty swallowing,  Tooth/dental problems, or  Sore throat,                No sneezing, itching, ear ache, nasal congestion, post nasal drip,   CV:  No chest  pain,  Orthopnea, PND, swelling in lower extremities, anasarca, dizziness, palpitations, syncope.   GI  No heartburn, indigestion, abdominal pain, nausea, vomiting, diarrhea, change in bowel habits, loss of appetite, bloody stools.   Resp: No shortness of breath with exertion or at rest.  No excess mucus, no productive cough,  No non-productive cough,  No coughing up of blood.  No change in color of mucus.  No wheezing.  No chest wall deformity  Skin: no rash or lesions.  GU: no dysuria, change in color of urine, no urgency or frequency.  No flank pain, no hematuria   MS:  No joint pain or swelling.  No decreased range of motion.  No back pain.    Physical Exam  BP 120/74   Pulse 69   Ht 4' 11 (1.499 m)   Wt 187 lb (84.8 kg)   LMP 02/12/2011   SpO2 98%   BMI 37.77 kg/m   GEN: A/Ox3; pleasant , NAD, well nourished    HEENT:  Louise/AT,  EACs-clear, TMs-wnl, NOSE-clear, THROAT-clear, no lesions, no postnasal drip or exudate noted.   NECK:  Supple w/ fair ROM; no JVD; normal carotid impulses w/o bruits; no thyromegaly or nodules palpated; no lymphadenopathy.    RESP  Clear  P & A; w/o, wheezes/ rales/ or rhonchi. no accessory muscle use, no dullness to percussion  CARD:  RRR, no m/r/g, no peripheral edema, pulses intact, no cyanosis or clubbing.  GI:   Soft & nt; nml bowel sounds; no organomegaly or masses detected.   Musco: Warm bil, no deformities or joint swelling noted.   Neuro: alert, no focal deficits noted.    Skin: Warm, no lesions or rashes    Lab Results:  CBC    Component Value Date/Time   WBC 10.0 10/13/2024 1017   WBC 9.4 05/05/2023 1115   RBC 5.18 10/13/2024 1017   RBC 5.07 05/05/2023 1115   HGB 16.6 (H) 10/13/2024 1017   HGB 15.4 03/13/2014 1422   HCT 49.8 (H) 10/13/2024 1017   HCT 45.3 03/13/2014 1422   PLT 283 10/13/2024 1017   MCV 96 10/13/2024 1017   MCV 89.0 03/13/2014 1422   MCH 32.0 10/13/2024 1017   MCH 33.1 05/05/2023 1115   MCHC 33.3  10/13/2024 1017   MCHC  35.7 05/05/2023 1115   RDW 11.8 10/13/2024 1017   RDW 13.2 03/13/2014 1422   LYMPHSABS 2.5 10/13/2024 1017   LYMPHSABS 3.2 03/13/2014 1422   MONOABS 0.8 09/24/2022 0845   MONOABS 0.7 03/13/2014 1422   EOSABS 0.4 10/13/2024 1017   BASOSABS 0.1 10/13/2024 1017   BASOSABS 0.0 03/13/2014 1422    BMET    Component Value Date/Time   NA 136 11/14/2024 1009   NA 138 10/13/2024 1017   K 3.4 (L) 11/14/2024 1009   CL 96 11/14/2024 1009   CO2 34 (H) 11/14/2024 1009   GLUCOSE 95 11/14/2024 1009   BUN 26 (H) 11/14/2024 1009   BUN 17 10/13/2024 1017   CREATININE 0.55 11/14/2024 1009   CALCIUM  9.7 11/14/2024 1009   GFRNONAA 91 01/22/2021 1047   GFRAA 105 01/22/2021 1047    BNP No results found for: BNP  ProBNP No results found for: PROBNP  Imaging: MR LIVER W WO CONTRAST Result Date: 12/05/2024 CLINICAL DATA:  elevated LFT's, hereditary hemochromatosis, fx hx of cirrhosis. EXAM: MRI ABDOMEN WITHOUT AND WITH CONTRAST TECHNIQUE: Multiplanar multisequence MR imaging of the abdomen was performed both before and after the administration of intravenous contrast. CONTRAST:  8mL GADAVIST  GADOBUTROL  1 MMOL/ML IV SOLN COMPARISON:  None Available. FINDINGS: Lower chest: Unremarkable MR appearance to the lung bases. No pleural effusion. No pericardial effusion. Normal heart size. Hepatobiliary: The liver is normal in size. Noncirrhotic configuration. No focal liver lesion. No imaging evidence of hemochromatosis. No intrahepatic or extrahepatic bile duct dilatation. No choledocholithiasis. Unremarkable gallbladder. Pancreas: No mass, inflammatory changes or other parenchymal abnormality identified. No main pancreatic duct dilation. Spleen: Size within normal limits. Normal signal intensity. There is a single sub 5 mm simple cyst lateral aspect, subcapsular location. Adrenals/Urinary Tract: Unremarkable adrenal glands. No hydroureteronephrosis. There are several scattered simple  cortical and sinus cysts throughout bilateral kidneys with largest partially exophytic cortical cyst arising from the left kidney upper pole, medially measuring up to 1.0 x 1.3 cm. No suspicious renal mass. Stomach/Bowel: Visualized portions within the abdomen are unremarkable. No disproportionate dilation of bowel loops. Vascular/Lymphatic: No pathologically enlarged lymph nodes identified. No abdominal aortic aneurysm demonstrated. No ascites. Other:  None. Musculoskeletal: No suspicious bone lesions identified. IMPRESSION: 1. No imaging evidence of hemochromatosis. No focal liver lesion. No intrahepatic or extrahepatic bile duct dilation. 2. Several other nonacute observations, as described above. Electronically Signed   By: Ree Molt M.D.   On: 12/05/2024 11:00   CUP PACEART REMOTE DEVICE CHECK Result Date: 11/30/2024 ILR summary report received. Battery status OK. Normal device function. No new symptom, tachy, brady, or pause episodes. No new AF episodes. Monthly summary reports and ROV/PRN [place], CVRS  NM PET CT CARDIAC PERFUSION MULTI W/ABSOLUTE BLOODFLOW Result Date: 11/22/2024   Mildly reduced counts in the lateral wall on stress, but MBFR is normal in this region. Suspect artifact. Normal study without ischemia or infarction.   LV perfusion is normal. There is no evidence of ischemia. There is no evidence of infarction.   Rest left ventricular function is normal. Rest EF: 63%. Stress left ventricular function is normal. Stress EF: 72%. End diastolic cavity size is normal.   Myocardial blood flow was computed to be 1.15ml/g/min at rest and 2.4ml/g/min at stress. Global myocardial blood flow reserve was 2.63 and was normal.   Coronary calcium  was present on the attenuation correction CT images. Severe coronary calcifications were present. Coronary calcifications were present in the left anterior descending artery, left circumflex  artery and right coronary artery distribution(s).   The study is  normal. The study is low risk.   Electronically signed by Darryle Decent, MD EXAM: OVER-READ OF NON-CARDIAC STRUCTURES ON CARDIAC PET-CT 11/22/2024 08:42:26 AM TECHNIQUE: Cardiac PET CT was performed without IV contrast. Reporting of cardiac CT and cardiac PET findings under separate report by cardiologist. COMPARISON: None available. CLINICAL HISTORY: Preop clearance. FINDINGS: Extracardiac vascular: Unremarkable. Mediastinum: Unremarkable. Lung: Unremarkable. Upper abdomen: Unremarkable. Musculoskeletal: Unremarkable. IMPRESSION: 1. No significant extracardiac findings. Electronically signed by: Norleen Boxer MD 11/22/2024 09:19 AM EST RP Workstation: [id number]   ketorolac  (TORADOL ) 30 MG/ML injection 30 mg     Date Action Dose Route User   10/26/2024 0838 Given 30 mg Intramuscular (Left Upper Outer Quadrant) Tonnie Shu D, CMA      methylPREDNISolone  acetate (DEPO-MEDROL ) injection 40 mg     Date Action Dose Route User   10/26/2024 0839 Given 40 mg Intramuscular (Right Upper Outer Quadrant) Tonnie Shu D, CMA           No data to display          No results found for: NITRICOXIDE   Assessment & Plan:   Assessment & Plan OSA (obstructive sleep apnea)  Assessment & Plan Obstructive sleep apnea Well-managed with CPAP. Compliance excellent, reducing events to <1/hour. Low risk for postoperative pulmonary complications. - Continue CPAP therapy. - Provide supportive oxygen and CPAP postoperatively as needed. - Encouraged early mobilization and deep breathing exercises postoperatively. - May require supportive oxygen and/or CPAP pending use of general anesthesia   ARISCAT score: Low risk 1.6% risk of in-hospital post-op pulmonary complications (composite including respiratory failure, respiratory infection, pleural effusion, atelectasis, pneumothorax, bronchospasm, aspiration pneumonitis)  No follow-ups on file.  Candis Dandy, PA-C 12/12/2024      [1]   Allergies Allergen Reactions   Neomycin Other (See Comments)    Inflammation--eye drop    Ace Inhibitors Cough  [2]  Social History Tobacco Use  Smoking Status Never  Smokeless Tobacco Never  Tobacco Comments   Never smoked 08/07/23   "

## 2024-12-12 NOTE — Telephone Encounter (Signed)
 A message was sent to Nichole Smith through team. She states new referral is needed. A new referral is placed and message was sent to Nichole Smith.

## 2024-12-12 NOTE — Telephone Encounter (Signed)
 Made aware Dr. Inocencio is ok to refer her to discuss [facility].  Aware office will contact to arrange this referral. (She has a lot of medical things going on and would like to aim for late March or later.  Will forward to scheduler for her FYI/to arrange).

## 2024-12-12 NOTE — Patient Instructions (Signed)
 Continue CPAP usage with goal of at least 4-6 hours or more per night.  Risk assessment:  Low risk for pulmonary complications postoperatively per ARISCAT score:  Low risk 1.6% risk of in-hospital post-op pulmonary complications (composite including respiratory failure, respiratory infection, pleural effusion, atelectasis, pneumothorax, bronchospasm, aspiration pneumonitis)   Recommendations:  May require supplemental oxygen or CPAP support postoperatively depending on the use of general anesthesia.  Early ambulation, coughing and deep breathing exercises recommended.

## 2024-12-12 NOTE — Progress Notes (Unsigned)
 "  SUBJECTIVE: Discussed the use of AI scribe software for clinical note transcription with the patient, who gave verbal consent to proceed.  Chief Complaint: Obesity  Interim History: She is up 1 lb since her last visit.  Down 57 lbs overall TBW loss of 23.9%  Nichole Smith is here to discuss her progress with her obesity treatment plan. She is on the keeping a food journal and adhering to recommended goals of <1300 calories and 80+ grams of protein and states she is following her eating plan approximately 25 % of the time. She states she is not exercising due to diffuse joint and back pain.   Nichole Smith is a 69 year old female who presents for follow-up of her obesity treatment plan.  She is on Zepbound  15 mg once weekly for obstructive sleep apnea and follows a calorie goal of 1300 or below, though she has not tracked consistently over the holidays. She gained one pound, partly due to muscle gain, with muscle mass increasing from 89.4 to 91 pounds and adipose mass from 85.8 to 86 pounds. She is spreading her Zepbound  doses to every nine days as she no longer has insurance coverage for these medications.  She has a history of coronary artery calcification, obstructive sleep apnea, hypertension, prediabetes, and is undergoing evaluation for positive [name] and hemochromatosis. Concerns about her iron levels due to potential organ damage are present, and she is awaiting a hematology appointment. There is a positive family history of cirrhosis. Fortunately , she has no evidence of liver damage or cirrhosis on her work up thus far.  She underwent an MRI of her liver 12/05/24:  IMPRESSION: 1. No imaging evidence of hemochromatosis. No focal liver lesion. No intrahepatic or extrahepatic bile duct dilation.     She has paroxysmal atrial fibrillation and is on chronic anticoagulation with Xarelto  20 mg daily. She takes metoprolol  50 mg daily and Cardizem  as needed for palpitations. She underwent PET CT  Cardiac stress testing 11/22/24 for known CAD:  Nuclear Findings  Perfusion/Defect LV perfusion is normal. There is no evidence of ischemia. There is no evidence of infarction.  Ejection Fraction Rest left ventricular function is normal. Rest EF: 63%. Stress left ventricular function is normal. Stress EF: 72%. End diastolic cavity size is normal. No evidence of transient ischemic dilation (TID) noted.  Myocardial Bood Flow Myocardial blood flow was computed to be 1.56ml/g/min at rest and 2.22ml/g/min at stress. Global myocardial blood flow reserve was 2.63 and was normal.  Calcium  Scoring Coronary calcium  was present on the attenuation correction CT images. Severe coronary calcifications were present. Coronary calcifications were present in the left anterior descending artery, left circumflex artery and right coronary artery distribution(s).  Stress Combined Conclusion The study is normal. The study is low risk.   Stress Findings  Resting ECG ECG rhythm shows normal sinus rhythm.  Stress Findings The patient reported no symptoms during the stress test.  Stress ECG No ST deviation was noted. There were no arrhythmias during stress. Arrhythmias during recovery: rare PVCs.The ECG was not diagnostic due to pharmacologic protocol.    She is scheduled for a hysterectomy at the end of February due to abnormal bleeding over several years, having undergone multiple diagnostic procedures including biopsies, D&C, and ultrasounds. Bladder work will also be done during the surgery.  She experiences spinal stenosis with significant pain, not well managed due to anticoagulation therapy limited her medication options. She has tried topical treatments like lidocaine  patches and diclofenac gel without  significant relief. Further evaluation by a rheumatologist and a neurosurgeon is pending.  She is on atorvastatin  40 mg daily for hyperlipidemia and Synthroid  125 mcg daily for hypothyroidism. Her planning and  routine have been disrupted due to frequent medical appointments and tests, which have been inconclusive so far.   OBJECTIVE: Visit Diagnoses: Problem List Items Addressed This Visit     PAIN IN JOINT, MULTIPLE SITES   Obstructive sleep apnea   Coronary artery calcification - Primary   Prediabetes   Obesity, Starting BMI 46.48   Paroxysmal atrial fibrillation (HCC)   Chronic anticoagulation   Other Visit Diagnoses       Hemochromatosis, unspecified hemochromatosis type         DUB (dysfunctional uterine bleeding)         BMI 36.0-36.9,adult Current BMI 36.7         Obesity Managed with Zepbound  15 mg once weekly, primarily for obstructive sleep apnea. Weight loss of 57 pounds achieved, with a goal to reach 170 pounds. Current weight is 181 pounds. Muscle mass increased slightly, and adipose mass decreased. Zepbound  aids in metabolic improvements and reduces cardiovascular risks. - Continue Zepbound  15 mg once weekly, extending to every nine days as tolerated. - Monitor weight and body composition changes. - Scheduled follow-up appointment in the second week of March to assess progress.  Obstructive sleep apnea Managed as part of the obesity treatment plan with Zepbound . Insurance is no longer covering medication.  Intensive lifestyle modifications are the first line treatment for this issue. We discussed several lifestyle modifications today and she will continue to work on diet, exercise and weight loss efforts. We will continue to monitor. Orders and follow up as documented in patient record. Educated pt that OSA is a cause of systemic hypertension and is associated with an increased incidence of stroke, heart failure, atrial fibrillation, and coronary heart disease.  Severe OSA increases all-cause mortality and cardiovascular mortality.  -Goal: Treatment of OSA via CPAP compliance and weight loss. Plasma ghrelin levels (appetite or hunger hormone) are significantly higher in OSA  patients than in BMI-matched controls, but decrease to levels similar to those of obese patients without OSA after CPAP treatment.  Weight loss improves OSA by several mechanisms, including reduction in fatty tissue in the throat (i.e. parapharyngeal fat) and the tongue. Loss of abdominal fat increases mediastinal traction on the upper airway making it less likely to collapse during sleep. Studies have also shown that compliance with CPAP treatment improves leptin imbalance. Plan: She has had 57 lb weight loss/ ~ 24% total body weight loss. Excellent clinically significant weight loss on GLP medications.   Paroxysmal atrial fibrillation on chronic anticoagulation/Known CAD Paroxysmal atrial fibrillation managed with Xarelto  20 mg daily.  She has paroxysmal atrial fibrillation and is on chronic anticoagulation with Xarelto  20 mg daily. She takes metoprolol  50 mg daily and Cardizem  as needed for palpitations. She is wondering about the Watchman procedure.  She underwent PET CT Cardiac stress testing 11/22/24 for known CAD:  Nuclear Findings  Perfusion/Defect LV perfusion is normal. There is no evidence of ischemia. There is no evidence of infarction.  Ejection Fraction Rest left ventricular function is normal. Rest EF: 63%. Stress left ventricular function is normal. Stress EF: 72%. End diastolic cavity size is normal. No evidence of transient ischemic dilation (TID) noted.  Myocardial Bood Flow Myocardial blood flow was computed to be 1.40ml/g/min at rest and 2.35ml/g/min at stress. Global myocardial blood flow reserve was 2.63 and was normal.  Calcium  Scoring Coronary calcium  was present on the attenuation correction CT images. Severe coronary calcifications were present. Coronary calcifications were present in the left anterior descending artery, left circumflex artery and right coronary artery distribution(s).  Stress Combined Conclusion The study is normal. The study is low risk.   Stress  Findings  Resting ECG ECG rhythm shows normal sinus rhythm.  Stress Findings The patient reported no symptoms during the stress test.  Stress ECG No ST deviation was noted. There were no arrhythmias during stress. Arrhythmias during recovery: rare PVCs.The ECG was not diagnostic due to pharmacologic protocol.  - Continue usual medications per cardiology.  - Continue Xarelto  20 mg daily. - Discuss Watchman procedure further with cardiology.    Prediabetes Last A1c was 5.3- at goal. Insulin  14.4- not at goal  Medication(s): Zepbound  15 mg SQ weekly Denies mass in neck, dysphagia, dyspepsia, persistent hoarseness, abdominal pain, or N/V/Constipation or diarrhea. Has annual eye exam. Mood is stable.   Polyphagia:No Lab Results  Component Value Date   HGBA1C 5.3 08/17/2024   HGBA1C 5.3 10/07/2023   HGBA1C 5.6 02/18/2023   HGBA1C 5.7 08/18/2022   HGBA1C 5.5 05/13/2022   Lab Results  Component Value Date   INSULIN  14.4 08/17/2024   INSULIN  13.9 10/07/2023   INSULIN  13.8 02/18/2023   INSULIN  22.2 09/16/2022   INSULIN  12.2 05/13/2022    Plan: Continue and refill Zepbound  15 mg SQ weekly Continue working on nutrition plan to decrease simple carbohydrates, increase lean proteins and exercise to promote weight loss, improve glycemic control and prevent progression to Type 2 diabetes.     Chronic pain due to spinal stenosis and arthritis Chronic pain due to spinal stenosis and arthritis. Pain management is challenging due to anticoagulation therapy. Topical treatments like diclofenac gel have limited efficacy for large areas of pain. - Continue to avoid Tylenol  due to liver enzyme concerns. - Consider topical treatments for localized pain relief.  Evaluation for hemochromatosis and positive [name] Ongoing evaluation for hemochromatosis with elevated iron levels. Positive [name] noted, with further workup pending. Hematology appointment scheduled for end of February. Primary care is  concerned about potential organ damage from elevated iron levels. - Continue follow up with hematology for further evaluation of hemochromatosis. - Continue to monitor iron levels and liver function tests.  Planned hysterectomy and bladder surgery for abnormal uterine bleeding and bladder dysfunction Scheduled for hysterectomy and bladder surgery at the end of February due to abnormal uterine bleeding and bladder dysfunction. Surgery expected to alleviate symptoms and improve quality of life. - Proceed with scheduled hysterectomy and bladder surgery.  Vitals Temp: 98.2 F (36.8 C) BP: 108/69 Pulse Rate: 74 SpO2: 95 %   Anthropometric Measurements Height: 4' 11 (1.499 m) Weight: 181 lb (82.1 kg) BMI (Calculated): 36.54 Weight at Last Visit: 180 lb Weight Lost Since Last Visit: 0 Weight Gained Since Last Visit: 1 lb Starting Weight: 238 lb Total Weight Loss (lbs): 57 lb (25.9 kg)   Body Composition  Body Fat %: 47.3 % Fat Mass (lbs): 86 lbs Muscle Mass (lbs): 91 lbs Total Body Water (lbs): 67.8 lbs Visceral Fat Rating : 15   Other Clinical Data Fasting: no Labs: no Today's Visit #: 73 Starting Date: 01/12/19     ASSESSMENT AND PLAN:  Diet: Nichole Smith is currently in the action stage of change. As such, her goal is to continue with weight loss efforts. She has agreed to keeping a food journal and adhering to recommended goals of <1300 calories and  80+ grams of protein.  Exercise: Nichole Smith has been instructed exercise as able with chronic pain issues for weight loss and overall health benefits.   Behavior Modification:  We discussed the following Behavioral Modification Strategies today: increasing lean protein intake, decreasing simple carbohydrates, increasing vegetables, increase H2O intake, increase high fiber foods, meal planning and cooking strategies, emotional eating strategies , avoiding temptations, planning for success, and keep a strict food journal. We  discussed various medication options to help Nichole Smith with her weight loss efforts and we both agreed to continue Zepbound  15 mg weekly for OSA and CAD as well as medical weight loss.  Return in about 8 weeks (around 02/07/2025).Nichole Smith She was informed of the importance of frequent follow up visits to maximize her success with intensive lifestyle modifications for her multiple health conditions.  Attestation Statements:   Reviewed by clinician on day of visit: allergies, medications, problem list, medical history, surgical history, family history, social history, and previous encounter notes.   Time spent on visit including pre-visit chart review and post-visit care and charting was 45 minutes.    Daimion Adamcik, PA-C  "

## 2024-12-13 ENCOUNTER — Ambulatory Visit (INDEPENDENT_AMBULATORY_CARE_PROVIDER_SITE_OTHER): Admitting: Physician Assistant

## 2024-12-13 VITALS — BP 108/69 | HR 74 | Temp 98.2°F | Ht 59.0 in | Wt 181.0 lb

## 2024-12-13 DIAGNOSIS — M1990 Unspecified osteoarthritis, unspecified site: Secondary | ICD-10-CM

## 2024-12-13 DIAGNOSIS — M4800 Spinal stenosis, site unspecified: Secondary | ICD-10-CM | POA: Diagnosis not present

## 2024-12-13 DIAGNOSIS — M2550 Pain in unspecified joint: Secondary | ICD-10-CM

## 2024-12-13 DIAGNOSIS — I2510 Atherosclerotic heart disease of native coronary artery without angina pectoris: Secondary | ICD-10-CM

## 2024-12-13 DIAGNOSIS — I10 Essential (primary) hypertension: Secondary | ICD-10-CM

## 2024-12-13 DIAGNOSIS — Z6836 Body mass index (BMI) 36.0-36.9, adult: Secondary | ICD-10-CM

## 2024-12-13 DIAGNOSIS — N938 Other specified abnormal uterine and vaginal bleeding: Secondary | ICD-10-CM

## 2024-12-13 DIAGNOSIS — R7303 Prediabetes: Secondary | ICD-10-CM

## 2024-12-13 DIAGNOSIS — G4733 Obstructive sleep apnea (adult) (pediatric): Secondary | ICD-10-CM

## 2024-12-13 DIAGNOSIS — Z7901 Long term (current) use of anticoagulants: Secondary | ICD-10-CM

## 2024-12-13 DIAGNOSIS — R7401 Elevation of levels of liver transaminase levels: Secondary | ICD-10-CM

## 2024-12-13 DIAGNOSIS — I480 Paroxysmal atrial fibrillation: Secondary | ICD-10-CM

## 2024-12-14 NOTE — Telephone Encounter (Signed)
 Copy of Nichole Smith's note from 12/12/24 faxed to [facility]

## 2024-12-15 ENCOUNTER — Encounter: Payer: Self-pay | Admitting: Obstetrics and Gynecology

## 2024-12-19 ENCOUNTER — Encounter

## 2024-12-21 ENCOUNTER — Telehealth: Payer: Self-pay | Admitting: Oncology

## 2024-12-21 NOTE — Telephone Encounter (Signed)
 PT called to cancel appts.

## 2024-12-23 NOTE — Progress Notes (Signed)
 " [facility] [address] [place] [address] Phone: [phone] Subjective:   Nichole Smith am a scribe for Dr. Claudene.  I'm seeing this patient by the request  of:  Panosh, Apolinar POUR, MD  CC: Low back pain, neck pain follow-up  YEP:Dlagzrupcz  10/26/2024 Known spinal stenosis and has had surgical intervention.  X-rays in June 2024 showed that there was some adjacent segment disease.  Will get repeat x-rays to further evaluate.  Discussed icing regimen.  Discussed which activities to do and which ones to avoid.  Increase activity slowly.  Follow-up again in 6 to 12 weeks otherwise.  Toradol  and Depo-Medrol  injections given today with the holidays coming up.  No other change in medical management with medications with patient seen her electrophysiologist later today.  They are discussing the possibility of removing the blood thinner and depending on that could allow for other anti-inflammatories.     Updated 12/27/2024 Nichole Smith is a 69 y.o. female coming in with complaint of cervical pain. Patient states that the neck is ok. She saw a neurosurgeon and she is on a steroid pack right now. Wants a tragedy to deal with the hamstring issue that has been flared up lately. Waist level left low back pain going on for about 6 weeks now.    Patient is scheduled for a hysterectomy February 24.  Since we have seen patient has seen pulmonology significant for obstructive sleep apnea.  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  Download 08/2014:  Good compliance, breakthru apnea on 10cm.  Changed to auto 5-12cm >> good control of AHI on f/u download ONO on CPAP:       Osteoarthritis    PCOS (polycystic ovarian syndrome)    PMB (postmenopausal bleeding)    SUI (stress urinary incontinence, female)    h/o   Wears glasses    Past Surgical History:  Procedure Laterality Date   ANTERIOR CERVICAL DECOMP/DISCECTOMY FUSION  12/24/2017   Procedure: Cervical five-six Cervical six-seven Anterior cervical decompression/discectomy/fusion;  Surgeon: Unice Pac, MD;  Location: [facility] OR;  Service: Neurosurgery;;   CARPAL TUNNEL RELEASE Left 12/24/2017   Procedure: LEFT CARPAL TUNNEL RELEASE;  Surgeon: Unice Pac, MD;  Location[facility] OR;  Service: Neurosurgery;  Laterality: Left;   CATARACT EXTRACTION Bilateral    Right eye 08/24/2023   CESAREAN SECTION     x 1 - twins   COLONOSCOPY  2021   DILATATION & CURRETTAGE/HYSTEROSCOPY WITH RESECTOCOPE N/A 07/18/2013   Procedure: DILATATION & CURETTAGE/HYSTEROSCOPY WITH RESECTOCOPE;  Surgeon: Ronal Elvie Pinal, MD;  Location: [facility];  Service: Gynecology;  Laterality: N/A;  mass resection   HYSTEROSCOPY WITH D & C N/A 05/05/2023   Procedure: DILATATION AND CURETTAGE /HYSTEROSCOPY;  Surgeon: Pinal Ronal RAMAN, MD;  Location: [facility] [place];  Service: Gynecology;  Laterality: N/A;   implantable loop recorder placement  05/20/2021  Medtronic Reveal Linq model [id number] implantable loop recorder (SN [id number])   Lung tumor removed  2012   Rt lung carcinoid   MOUTH SURGERY     pre-cancerous ulcer removed   POLYPECTOMY     SHOULDER OPEN ROTATOR CUFF REPAIR  07/20/2010   Right   WISDOM TOOTH EXTRACTION     Social History   Socioeconomic History   Marital status: Married    Spouse name: Cheryl   Number of children: 2   Years of education: Not on file   Highest education level: Bachelor's degree (e.g., BA, AB, BS)   Occupational History   Occupation: retired    Associate Professor: Museum/gallery Exhibitions Officer  Tobacco Use   Smoking status: Never   Smokeless tobacco: Never   Tobacco comments:    Never smoked 08/07/23  Vaping Use   Vaping status: Never Used  Substance and Sexual Activity   Alcohol use: Not Currently    Comment: occ   Drug use: No   Sexual activity: Not Currently    Partners: Male    Birth control/protection: Post-menopausal  Other Topics Concern   Not on file  Social History Narrative   hhof 2    2 Children at college and  beyond      [name]    Married    Retired age 67 2015   Recently moved taking care of grandchildren during the week.   Social Drivers of Health   Tobacco Use: Low Risk (12/12/2024)   Patient History    Smoking Tobacco Use: Never    Smokeless Tobacco Use: Never    Passive Exposure: Not on file  Financial Resource Strain: Low Risk (11/01/2024)   Overall Financial Resource Strain (CARDIA)    Difficulty of Paying Living Expenses: Not hard at all  Food Insecurity: No Food Insecurity (11/01/2024)   Epic    Worried About Radiation Protection Practitioner of Food in the Last Year: Never true    Ran Out of Food in the Last Year: Never true  Transportation Needs: No Transportation Needs (11/01/2024)   Epic    Lack of Transportation (Medical): No    Lack of Transportation (Non-Medical): No  Physical Activity: Sufficiently Active (11/01/2024)   Exercise Vital Sign    Days of Exercise per Week: 5 days    Minutes of Exercise per Session: 30 min  Stress: No Stress Concern Present (11/01/2024)   [employer] of Occupational Health - Occupational Stress Questionnaire    Feeling of Stress: Only a little  Social Connections: Socially Integrated (11/01/2024)   Social Connection and Isolation Panel    Frequency of Communication with Friends and Family: Once a week    Frequency of Social Gatherings with Friends and Family: More than three times a week    Attends Religious Services:  More than 4 times per year    Active Member of Clubs or Organizations: Yes    Attends Banker Meetings: More than 4 times per year    Marital Status: Married  Depression (PHQ2-9): Low Risk (11/02/2024)   Depression (PHQ2-9)    PHQ-2 Score: 1  Alcohol Screen: Low Risk (11/01/2024)   Alcohol Screen    Last Alcohol Screening Score (AUDIT): 1  Housing: Unknown (11/01/2024)   Epic    Unable to Pay for Housing in the Last Year: No    Number of Times Moved in the Last Year: Not on file    Homeless in the Last Year: No  Utilities: Not At Risk (11/12/2022)  AHC Utilities    Threatened with loss of utilities: No  Health Literacy: Not on file   Allergies[1] Family History  Problem Relation Age of Onset   Stroke Mother        died age 34   Hypertension Mother    Sudden death Mother    Alcoholism Mother    Coronary artery disease Father        died age 73   Hypertension Father    Liver disease Father    Alcohol abuse Father    Obesity Father    Parkinson's disease Brother    Diabetes Brother    Hypertension Brother    Hypertension Maternal Uncle    Colon cancer Neg Hx    Rectal cancer Neg Hx    Stomach cancer Neg Hx    Colon polyps Neg Hx    Esophageal cancer Neg Hx     Current Outpatient Medications (Endocrine & Metabolic):    alendronate  (FOSAMAX ) 70 MG tablet, Take 1 tablet (70 mg total) by mouth every 7 (seven) days. Take first thing in am with 6 oz. Water.  Be upright after taking.  Eat nothing for one hour.   SYNTHROID  125 MCG tablet, Take 1 tablet (125 mcg total) by mouth daily before breakfast. Dosage change  Current Outpatient Medications (Cardiovascular):    atorvastatin  (LIPITOR) 40 MG tablet, Take 1 tablet (40 mg total) by mouth daily.   chlorthalidone  (HYGROTON ) 25 MG tablet, Take 1 tablet (25 mg total) by mouth daily.   diltiazem  (CARDIZEM  CD) 180 MG 24 hr capsule, Take 1 capsule (180 mg total) by mouth daily.   diltiazem  (CARDIZEM ) 30 MG tablet, 1  tablet every 6 hours for palpitations   metoprolol  succinate (TOPROL -XL) 50 MG 24 hr tablet, Take 1 tablet (50 mg total) by mouth daily.  Current Outpatient Medications (Analgesics):    Suzetrigine  (JOURNAVX ) 50 MG TABS, Take 50 mg by mouth 2 (two) times daily. (Patient not taking: Reported on 12/13/2024)  Current Outpatient Medications (Hematological):    XARELTO  20 MG TABS tablet, TAKE 1 TABLET DAILY WITH SUPPER  Current Outpatient Medications (Other):    CALCIUM  PO, Take 1 tablet by mouth at bedtime.    Doxepin  HCl 3 MG TABS, Take 1 tablet (3 mg total) by mouth at bedtime as needed.   estradiol  (ESTRACE ) 0.1 MG/GM vaginal cream, INSERT 1 GM VAGINALLY TWICE WEEKLY   GEMTESA  75 MG TABS, Take 1 tablet (75 mg total) by mouth daily as needed.   Multiple Vitamins-Minerals (PRESERVISION AREDS 2 PO), Take 1 tablet by mouth 2 (two) times daily.   nitrofurantoin , macrocrystal-monohydrate, (MACROBID ) 100 MG capsule, Take 1 capsule (100 mg total) by mouth 2 (two) times daily. (Patient not taking: Reported on 12/13/2024)   phenazopyridine  (PYRIDIUM ) 200 MG tablet, Take 1 tablet (200 mg total) by mouth 3 (three) times daily as needed for pain. (Patient not taking: Reported on 12/13/2024)   phenazopyridine  (PYRIDIUM ) 200 MG tablet, Take 1 tablet (200 mg total) by mouth 3 (three) times daily as needed for pain (urethral spasm). (Patient not taking: Reported on 12/13/2024)   potassium chloride  SA (KLOR-CON  M) 20 MEQ tablet, Take 1 tablet (20 mEq total) by mouth daily.   Probiotic Product (PROBIOTIC PO), Take by mouth.   Tirzepatide -Weight Management (ZEPBOUND ) 15 MG/0.5ML SOLN, Inject 15 mg into the skin once a week.   Reviewed prior external information including notes and imaging from  primary care provider As well as notes that were available from care everywhere  and other healthcare systems.  Past medical history, social, surgical and family history all reviewed in electronic medical record.  No  pertanent information unless stated regarding to the chief complaint.   Review of Systems:  No headache, visual changes, nausea, vomiting, diarrhea, constipation, dizziness, abdominal pain, skin rash, fevers, chills, night sweats, weight loss, swollen lymph nodes, body aches, joint swelling, chest pain, shortness of breath, mood changes. POSITIVE muscle aches  Objective  Blood pressure 120/80, pulse 66, height 4' 11 (1.499 m), last menstrual period 02/12/2011, SpO2 97%.   General: No apparent distress alert and oriented x3 mood and affect normal, dressed appropriately.  HEENT: Pupils equal, extraocular movements intact  Respiratory: Patient's speak in full sentences and does not appear short of breath  Cardiovascular: No lower extremity edema, non tender, no erythema  Low back does have some loss lordosis noted.  Negative straight leg test at the moment.  No tenderness to palpation on exam today. Mild antalgic gait noted.  Still significant limited range of motion of the back with range of motion.   Impression and Recommendations:     The above documentation has been reviewed and is accurate and complete Nichole CHRISTELLA Sharps, DO       [1]  Allergies Allergen Reactions   Neomycin Other (See Comments)    Inflammation--eye drop    Ace Inhibitors Cough   "

## 2024-12-27 ENCOUNTER — Ambulatory Visit: Admitting: Family Medicine

## 2024-12-27 VITALS — BP 120/80 | HR 66 | Ht 59.0 in

## 2024-12-27 DIAGNOSIS — M5416 Radiculopathy, lumbar region: Secondary | ICD-10-CM

## 2024-12-27 NOTE — Assessment & Plan Note (Signed)
 Concerned that patient has left leg pain was secondary to more of either the piriformis or this severe spinal stenosis noted on MRI previously.  Has seen neurosurgery but they would like another MRI.  They are going to order one to further evaluate.  Discussed with patient about icing regimen and home exercises.  Discussed which activities to do and which ones to avoid.  Increase activity slowly.  Follow-up again in 6 to 12 weeks.

## 2024-12-27 NOTE — Patient Instructions (Addendum)
 Good to see you. Body Helix thigh compression sleeve. Wear with repetitive activity.  Keep me updated after the surgery and the MRI. Pain control, give us  a call if you need another round of steroids.  See me again in 10 to 12 weeks.

## 2024-12-29 ENCOUNTER — Encounter

## 2024-12-29 NOTE — Telephone Encounter (Signed)
 Spoke with patient and arranged Watchman consult with Dr. Kennyth 04/06/2025 at 3:15. Patient grateful for call.

## 2024-12-30 ENCOUNTER — Ambulatory Visit: Attending: Cardiology

## 2024-12-30 ENCOUNTER — Ambulatory Visit: Payer: Self-pay | Admitting: Cardiology

## 2024-12-30 DIAGNOSIS — I480 Paroxysmal atrial fibrillation: Secondary | ICD-10-CM | POA: Diagnosis not present

## 2024-12-30 LAB — CUP PACEART REMOTE DEVICE CHECK
Date Time Interrogation Session: 20260129230709
Implantable Pulse Generator Implant Date: 20220620

## 2025-01-03 ENCOUNTER — Encounter (HOSPITAL_BASED_OUTPATIENT_CLINIC_OR_DEPARTMENT_OTHER): Payer: Self-pay

## 2025-01-03 NOTE — Progress Notes (Signed)
 Remote Loop Recorder Transmission

## 2025-01-05 ENCOUNTER — Inpatient Hospital Stay

## 2025-01-05 ENCOUNTER — Inpatient Hospital Stay: Admitting: Hematology and Oncology

## 2025-01-05 ENCOUNTER — Encounter: Payer: Self-pay | Admitting: Hematology and Oncology

## 2025-01-05 VITALS — BP 111/55 | HR 78 | Temp 98.1°F | Resp 18 | Ht 59.0 in | Wt 186.3 lb

## 2025-01-05 DIAGNOSIS — D751 Secondary polycythemia: Secondary | ICD-10-CM

## 2025-01-05 LAB — CBC WITH DIFFERENTIAL (CANCER CENTER ONLY)
Abs Immature Granulocytes: 0.03 10*3/uL (ref 0.00–0.07)
Basophils Absolute: 0.1 10*3/uL (ref 0.0–0.1)
Basophils Relative: 1 %
Eosinophils Absolute: 0.3 10*3/uL (ref 0.0–0.5)
Eosinophils Relative: 3 %
HCT: 46.6 % — ABNORMAL HIGH (ref 36.0–46.0)
Hemoglobin: 15.8 g/dL — ABNORMAL HIGH (ref 12.0–15.0)
Immature Granulocytes: 0 %
Lymphocytes Relative: 24 %
Lymphs Abs: 2.2 10*3/uL (ref 0.7–4.0)
MCH: 31 pg (ref 26.0–34.0)
MCHC: 33.9 g/dL (ref 30.0–36.0)
MCV: 91.4 fL (ref 80.0–100.0)
Monocytes Absolute: 0.7 10*3/uL (ref 0.1–1.0)
Monocytes Relative: 8 %
Neutro Abs: 5.8 10*3/uL (ref 1.7–7.7)
Neutrophils Relative %: 64 %
Platelet Count: 214 10*3/uL (ref 150–400)
RBC: 5.1 MIL/uL (ref 3.87–5.11)
RDW: 13.1 % (ref 11.5–15.5)
WBC Count: 9.1 10*3/uL (ref 4.0–10.5)
nRBC: 0 % (ref 0.0–0.2)

## 2025-01-05 LAB — FERRITIN: Ferritin: 120 ng/mL (ref 11–307)

## 2025-01-05 NOTE — Assessment & Plan Note (Signed)
 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

## 2025-01-05 NOTE — Progress Notes (Signed)
 [facility] CONSULT NOTE  Patient Care Team: Panosh, Apolinar POUR, MD as PCP - General (Internal Medicine) [name], MD as PCP - Cardiology (Cardiology) Inocencio Soyla Lunger, MD as PCP - Electrophysiology (Cardiology) Robinson Pao, MD (Dermatology) Cleotilde Ronal RAMAN, MD as Attending Physician (Gynecology) Shari Sieving, MD as Attending Physician (Orthopedic Surgery) Jude Harden GAILS, MD as Consulting Physician (Pulmonary Disease)  ASSESSMENT & PLAN:  Hemochromatosis, hereditary I reviewed multiple test results with the patient The patient is homozygous for H63D mutation; despite that, her last ferritin from December 2025 was low at 106 I will repeat ferritin level today and will call her with test results tomorrow She does not need phlebotomy unless her ferritin is at 500 We discussed inheritance pattern for hemochromatosis We discussed genetic testing in her children and implication of genetic testing For future follow-up, I recommend annual ferritin level to be checked by her primary care doctor The patient will reach out to me if she needs phlebotomy  Polycythemia, secondary She was initially evaluated in 2015 for 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 and she has been prescribed CPAP machine and using it on a regular basis She continues to have mild intermittent erythrocytosis There is no role for phlebotomy unless her hematocrit is over 50% Repeat CBC today showed hematocrit less than 50% and the patient is reassured  Orders Placed This Encounter  Procedures   CBC with Differential (Cancer Center Only)    Standing Status:   Future    Number of Occurrences:   1    Expiration Date:   01/05/2026   Ferritin    Standing Status:   Future    Number of Occurrences:   1    Expiration Date:   01/05/2026    The total time spent in the appointment was 60 minutes encounter with patients including review of chart and various tests results, discussions about  plan of care and coordination of care plan   All questions were answered. The patient knows to call the clinic with any problems, questions or concerns. No barriers to learning was detected.  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 female is here because of recent findings of hemochromatosis I saw the patient in April 2015 for evaluation of chronic erythrocytosis.  Peripheral blood for erythropoietin  level and JAK2 mutation were negative.  The patient was subsequently referred to pulmonologist and she tested positive for obstructive sleep apnea and has been prescribed CPAP machine Most recently, she was noted to have intermittent elevated liver enzymes She subsequently underwent extensive testing and genetic testing for hemochromatosis came back homozygous for H63D Ferritin level was low at 106 She is not aware of family history of hemochromatosis She has 2 brothers and 3 daughters  MEDICAL HISTORY:  Past Medical History:  Diagnosis Date   Arthritis    knees, back   Bursitis    hips - almost gone per pt    Carcinoid tumor of lung (HCC) 2012   right found incidentally on chest xray eval for r/o vasxculitis    Coronary artery calcification 07/23/2016   Dysrhythmia    irregular occassional, paf  no current problem   Frequent UTI    Hypertension    Hypothyroidism    Infertility, female    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  Download 08/2014:  Good compliance, breakthru apnea on 10cm.  Changed to auto 5-12cm >> good control of AHI on f/u download ONO on CPAP:       Osteoarthritis    PCOS (polycystic ovarian syndrome)    PMB (postmenopausal bleeding)    SUI (stress urinary incontinence, female)    h/o   Wears glasses      SURGICAL HISTORY: Past Surgical History:  Procedure Laterality Date   ANTERIOR CERVICAL DECOMP/DISCECTOMY FUSION  12/24/2017   Procedure: Cervical five-six Cervical six-seven Anterior cervical decompression/discectomy/fusion;  Surgeon: Unice Pac, MD;  Location: [facility] OR;  Service: Neurosurgery;;   CARPAL TUNNEL RELEASE Left 12/24/2017   Procedure: LEFT CARPAL TUNNEL RELEASE;  Surgeon: Unice Pac, MD;  Location: [facility] OR;  Service: Neurosurgery;  Laterality: Left;   CATARACT EXTRACTION Bilateral    Right eye 08/24/2023   CESAREAN SECTION     x 1 - twins   COLONOSCOPY  2021   DILATATION & CURRETTAGE/HYSTEROSCOPY WITH RESECTOCOPE N/A 07/18/2013   Procedure: DILATATION & CURETTAGE/HYSTEROSCOPY WITH RESECTOCOPE;  Surgeon: Ronal Elvie Pinal, MD;  Location: [facility] ORS;  Service: Gynecology;  Laterality: N/A;  mass resection   HYSTEROSCOPY WITH D & C N/A 05/05/2023   Procedure: DILATATION AND CURETTAGE /HYSTEROSCOPY;  Surgeon: Pinal Ronal RAMAN, MD;  Location: [facility] [place];  Service: Gynecology;  Laterality: N/A;   implantable loop recorder placement  05/20/2021   Medtronic Reveal Linq model [id number] implantable loop recorder (SN [id number])   Lung tumor removed  2012   Rt lung carcinoid   MOUTH SURGERY     pre-cancerous ulcer removed   POLYPECTOMY     SHOULDER OPEN ROTATOR CUFF REPAIR  07/20/2010   Right   WISDOM TOOTH EXTRACTION      SOCIAL HISTORY: Social History   Socioeconomic History   Marital status: Married    Spouse name: Cheryl   Number of children: 2   Years of education: Not on file   Highest education level: Bachelor's degree (e.g., BA, AB, BS)  Occupational History   Occupation: retired    Associate Professor: Museum/gallery Exhibitions Officer  Tobacco Use   Smoking status: Never   Smokeless tobacco: Never   Tobacco comments:    Never smoked 08/07/23  Vaping Use   Vaping status: Never Used  Substance and Sexual Activity   Alcohol use: Not Currently    Comment: occ    Drug use: No   Sexual activity: Not Currently    Partners: Male    Birth control/protection: Post-menopausal  Other Topics Concern   Not on file  Social History Narrative   hhof 2    2 Children at college and  beyond      [name]    Married    Retired age 69 2015   Recently moved taking care of grandchildren during the week.   Social Drivers of Health   Tobacco Use: Low Risk (01/05/2025)   Patient History    Smoking Tobacco Use: Never    Smokeless Tobacco Use: Never    Passive Exposure: Not on file  Financial Resource Strain: Low Risk (11/01/2024)   Overall Financial Resource Strain (CARDIA)    Difficulty of Paying Living Expenses: Not hard at all  Food Insecurity: No Food Insecurity (01/02/2025)   Epic    Worried About Radiation Protection Practitioner of Food in the Last Year: Never true    Ran Out of Food in the Last Year: Never true  Transportation Needs:  No Transportation Needs (01/02/2025)   Epic    Lack of Transportation (Medical): No    Lack of Transportation (Non-Medical): No  Physical Activity: Sufficiently Active (11/01/2024)   Exercise Vital Sign    Days of Exercise per Week: 5 days    Minutes of Exercise per Session: 30 min  Stress: No Stress Concern Present (11/01/2024)   [employer] of Occupational Health - Occupational Stress Questionnaire    Feeling of Stress: Only a little  Social Connections: Socially Integrated (11/01/2024)   Social Connection and Isolation Panel    Frequency of Communication with Friends and Family: Once a week    Frequency of Social Gatherings with Friends and Family: More than three times a week    Attends Religious Services: More than 4 times per year    Active Member of [employer] or Organizations: Yes    Attends Banker Meetings: More than 4 times per year    Marital Status: Married  Catering Manager Violence: Not At Risk (11/12/2022)   Humiliation, Afraid, Rape, and Kick questionnaire    Fear of Current or Ex-Partner: No     Emotionally Abused: No    Physically Abused: No    Sexually Abused: No  Depression (PHQ2-9): Low Risk (11/02/2024)   Depression (PHQ2-9)    PHQ-2 Score: 1  Alcohol Screen: Low Risk (11/01/2024)   Alcohol Screen    Last Alcohol Screening Score (AUDIT): 1  Housing: Low Risk (01/02/2025)   Epic    Unable to Pay for Housing in the Last Year: No    Number of Times Moved in the Last Year: 0    Homeless in the Last Year: No  Utilities: Not At Risk (01/02/2025)   Epic    Threatened with loss of utilities: No  Health Literacy: Not on file    FAMILY HISTORY: Family History  Problem Relation Age of Onset   Stroke Mother        died age 98   Hypertension Mother    Sudden death Mother    Alcoholism Mother    Coronary artery disease Father        died age 37   Hypertension Father    Liver disease Father    Alcohol abuse Father    Obesity Father    Parkinson's disease Brother    Diabetes Brother    Hypertension Brother    Hypertension Maternal Uncle    Colon cancer Neg Hx    Rectal cancer Neg Hx    Stomach cancer Neg Hx    Colon polyps Neg Hx    Esophageal cancer Neg Hx     ALLERGIES:  is allergic to neomycin and ace inhibitors.  MEDICATIONS:  Current Outpatient Medications  Medication Sig Dispense Refill   alendronate  (FOSAMAX ) 70 MG tablet Take 1 tablet (70 mg total) by mouth every 7 (seven) days. Take first thing in am with 6 oz. Water.  Be upright after taking.  Eat nothing for one hour. 13 tablet 3   atorvastatin  (LIPITOR) 40 MG tablet Take 1 tablet (40 mg total) by mouth daily. 90 tablet 3   CALCIUM  PO Take 1 tablet by mouth at bedtime.      chlorthalidone  (HYGROTON ) 25 MG tablet Take 1 tablet (25 mg total) by mouth daily. 90 tablet 3   diltiazem  (CARDIZEM  CD) 180 MG 24 hr capsule Take 1 capsule (180 mg total) by mouth daily. 90 each 3   diltiazem  (CARDIZEM ) 30 MG tablet 1 tablet every 6 hours  for palpitations 60 tablet 3   Doxepin  HCl 3 MG TABS Take 1 tablet (3 mg total) by  mouth at bedtime as needed. 30 tablet 2   estradiol  (ESTRACE ) 0.1 MG/GM vaginal cream INSERT 1 GM VAGINALLY TWICE WEEKLY 42.5 g 3   GEMTESA  75 MG TABS Take 1 tablet (75 mg total) by mouth daily as needed. 90 tablet 3   metoprolol  succinate (TOPROL -XL) 50 MG 24 hr tablet Take 1 tablet (50 mg total) by mouth daily. 90 tablet 2   Multiple Vitamins-Minerals (PRESERVISION AREDS 2 PO) Take 1 tablet by mouth 2 (two) times daily.     potassium chloride  SA (KLOR-CON  M) 20 MEQ tablet Take 1 tablet (20 mEq total) by mouth daily. 90 tablet 1   Probiotic Product (PROBIOTIC PO) Take by mouth.     Suzetrigine  (JOURNAVX ) 50 MG TABS Take 50 mg by mouth 2 (two) times daily. (Patient not taking: Reported on 12/13/2024) 180 tablet 0   SYNTHROID  125 MCG tablet Take 1 tablet (125 mcg total) by mouth daily before breakfast. Dosage change 90 tablet 3   Tirzepatide -Weight Management (ZEPBOUND ) 15 MG/0.5ML SOLN Inject 15 mg into the skin once a week. 2 mL 2   XARELTO  20 MG TABS tablet TAKE 1 TABLET DAILY WITH SUPPER 90 tablet 3   No current facility-administered medications for this visit.    REVIEW OF SYSTEMS:   Constitutional: Denies fevers, chills or abnormal night sweats Eyes: Denies blurriness of vision, double vision or watery eyes Ears, nose, mouth, throat, and face: Denies mucositis or sore throat Respiratory: Denies cough, dyspnea or wheezes Cardiovascular: Denies palpitation, chest discomfort or lower extremity swelling Gastrointestinal:  Denies nausea, heartburn or change in bowel habits Skin: Denies abnormal skin rashes Lymphatics: Denies new lymphadenopathy or easy bruising Neurological:Denies numbness, tingling or new weaknesses Behavioral/Psych: Mood is stable, no new changes  All other systems were reviewed with the patient and are negative.  PHYSICAL EXAMINATION: ECOG PERFORMANCE STATUS: 0 - Asymptomatic  Vitals:   01/05/25 1317  BP: (!) 111/55  Pulse: 78  Resp: 18  Temp: 98.1 F (36.7 C)   SpO2: 100%   Filed Weights   01/05/25 1317  Weight: 186 lb 4.8 oz (84.5 kg)    GENERAL:alert, no distress and comfortable NEURO: no focal motor/sensory deficits  LABORATORY DATA:  I have reviewed the data as listed Lab Results  Component Value Date   WBC 9.1 01/05/2025   HGB 15.8 (H) 01/05/2025   HCT 46.6 (H) 01/05/2025   MCV 91.4 01/05/2025   PLT 214 01/05/2025   Recent Labs    08/17/24 0945 09/06/24 1051 10/13/24 1017 10/19/24 0859 11/14/24 1009  NA 141 138 138  --  136  K 3.2* 3.4* 3.7  --  3.4*  CL 98 97 97  --  96  CO2 28 34* 28  --  34*  GLUCOSE 77 95 75  --  95  BUN 15 21 17   --  26*  CREATININE 0.55* 0.55 0.56*  --  0.55  CALCIUM  9.5 10.3 9.9  --  9.7  PROT 6.6  --   --  7.1 6.8  ALBUMIN 4.0  --   --  3.9 3.8  AST 33  --   --  24 28  ALT 51*  --   --  26 41*  ALKPHOS 66  --   --  52 54  BILITOT 0.8  --   --  0.8 0.8  BILIDIR  --   --   --  0.2 0.1    RADIOGRAPHIC STUDIES: I have personally reviewed the radiological images as listed and agreed with the findings in the report. CUP PACEART REMOTE DEVICE CHECK Result Date: 12/30/2024 ILR summary report received. Battery status OK. Normal device function. No new symptom, tachy, brady, or pause episodes. No new AF episodes. Monthly summary reports and ROV/PRN LA, CVRS

## 2025-01-05 NOTE — Assessment & Plan Note (Addendum)
 I reviewed multiple test results with the patient The patient is homozygous for H63D mutation; despite that, her last ferritin from December 2025 was low at 106 I will repeat ferritin level today and will call her with test results tomorrow She does not need phlebotomy unless her ferritin is at 500 We discussed inheritance pattern for hemochromatosis We discussed genetic testing in her children and implication of genetic testing For future follow-up, I recommend annual ferritin level to be checked by her primary care doctor The patient will reach out to me if she needs phlebotomy

## 2025-01-06 ENCOUNTER — Telehealth: Payer: Self-pay

## 2025-01-06 NOTE — Telephone Encounter (Signed)
 Called and given below message. She verbalized understanding.

## 2025-01-06 NOTE — Telephone Encounter (Signed)
-----   Message from [name], MD sent at 01/06/2025  9:32 AM EST ----- Pls call her Ferritin is still low

## 2025-01-10 ENCOUNTER — Encounter: Admitting: Obstetrics and Gynecology

## 2025-01-11 ENCOUNTER — Encounter: Admitting: Physician Assistant

## 2025-01-16 ENCOUNTER — Inpatient Hospital Stay

## 2025-01-16 ENCOUNTER — Inpatient Hospital Stay: Admitting: Oncology

## 2025-01-23 ENCOUNTER — Encounter

## 2025-01-24 ENCOUNTER — Ambulatory Visit (HOSPITAL_COMMUNITY): Admit: 2025-01-24 | Admitting: Obstetrics and Gynecology

## 2025-01-30 ENCOUNTER — Encounter

## 2025-01-30 ENCOUNTER — Ambulatory Visit

## 2025-02-07 ENCOUNTER — Ambulatory Visit (INDEPENDENT_AMBULATORY_CARE_PROVIDER_SITE_OTHER): Admitting: Physician Assistant

## 2025-02-15 ENCOUNTER — Ambulatory Visit: Attending: Family Medicine

## 2025-02-27 ENCOUNTER — Encounter

## 2025-03-02 ENCOUNTER — Ambulatory Visit

## 2025-03-02 ENCOUNTER — Encounter

## 2025-03-08 ENCOUNTER — Encounter: Admitting: Obstetrics and Gynecology

## 2025-03-20 ENCOUNTER — Ambulatory Visit: Admitting: Family Medicine

## 2025-04-02 ENCOUNTER — Ambulatory Visit

## 2025-04-03 ENCOUNTER — Encounter

## 2025-04-06 ENCOUNTER — Ambulatory Visit: Admitting: Cardiology

## 2025-05-03 ENCOUNTER — Ambulatory Visit

## 2025-05-04 ENCOUNTER — Encounter

## 2025-05-08 ENCOUNTER — Encounter

## 2025-06-03 ENCOUNTER — Ambulatory Visit

## 2025-06-05 ENCOUNTER — Encounter

## 2025-06-12 ENCOUNTER — Encounter

## 2025-07-04 ENCOUNTER — Ambulatory Visit

## 2025-07-17 ENCOUNTER — Encounter

## 2025-08-04 ENCOUNTER — Ambulatory Visit

## 2025-09-04 ENCOUNTER — Ambulatory Visit

## 2025-10-05 ENCOUNTER — Ambulatory Visit

## 2025-11-05 ENCOUNTER — Ambulatory Visit
